# Patient Record
Sex: Female | Born: 1965
Health system: Southern US, Community
[De-identification: ages and names within clinical notes are randomized; demographics above are authoritative.]

## PROBLEM LIST (undated history)

## (undated) DIAGNOSIS — I639 Cerebral infarction, unspecified: Secondary | ICD-10-CM

## (undated) DIAGNOSIS — K648 Other hemorrhoids: Secondary | ICD-10-CM

## (undated) DIAGNOSIS — I82409 Acute embolism and thrombosis of unspecified deep veins of unspecified lower extremity: Secondary | ICD-10-CM

## (undated) DIAGNOSIS — J189 Pneumonia, unspecified organism: Secondary | ICD-10-CM

## (undated) DIAGNOSIS — J069 Acute upper respiratory infection, unspecified: Secondary | ICD-10-CM

## (undated) DIAGNOSIS — F329 Major depressive disorder, single episode, unspecified: Secondary | ICD-10-CM

## (undated) DIAGNOSIS — D259 Leiomyoma of uterus, unspecified: Secondary | ICD-10-CM

## (undated) DIAGNOSIS — K219 Gastro-esophageal reflux disease without esophagitis: Secondary | ICD-10-CM

## (undated) DIAGNOSIS — R109 Unspecified abdominal pain: Secondary | ICD-10-CM

## (undated) DIAGNOSIS — K52832 Lymphocytic colitis: Secondary | ICD-10-CM

## (undated) DIAGNOSIS — F32A Depression, unspecified: Secondary | ICD-10-CM

## (undated) DIAGNOSIS — F419 Anxiety disorder, unspecified: Secondary | ICD-10-CM

## (undated) DIAGNOSIS — I10 Essential (primary) hypertension: Secondary | ICD-10-CM

## (undated) DIAGNOSIS — E7800 Pure hypercholesterolemia, unspecified: Secondary | ICD-10-CM

## (undated) DIAGNOSIS — E538 Deficiency of other specified B group vitamins: Secondary | ICD-10-CM

## (undated) DIAGNOSIS — I82412 Acute embolism and thrombosis of left femoral vein: Secondary | ICD-10-CM

## (undated) DIAGNOSIS — K589 Irritable bowel syndrome without diarrhea: Secondary | ICD-10-CM

## (undated) DIAGNOSIS — K5190 Ulcerative colitis, unspecified, without complications: Secondary | ICD-10-CM

## (undated) HISTORY — DX: Gastro-esophageal reflux disease without esophagitis: K21.9

## (undated) HISTORY — DX: Acute embolism and thrombosis of left femoral vein: I82.412

## (undated) HISTORY — DX: Acute upper respiratory infection, unspecified: J06.9

## (undated) HISTORY — DX: Unspecified abdominal pain: R10.9

## (undated) HISTORY — DX: Anxiety disorder, unspecified: F41.9

## (undated) HISTORY — DX: Other hemorrhoids: K64.8

## (undated) HISTORY — DX: Lymphocytic colitis: K52.832

## (undated) HISTORY — DX: Leiomyoma of uterus, unspecified: D25.9

## (undated) HISTORY — DX: Pneumonia, unspecified organism: J18.9

## (undated) HISTORY — DX: Acute embolism and thrombosis of unspecified deep veins of unspecified lower extremity: I82.409

## (undated) HISTORY — PX: UTERINE FIBROID SURGERY: SHX826

## (undated) HISTORY — DX: Depression, unspecified: F32.A

## (undated) HISTORY — DX: Major depressive disorder, single episode, unspecified: F32.9

## (undated) HISTORY — DX: Pure hypercholesterolemia, unspecified: E78.00

## (undated) HISTORY — PX: MYOMECTOMY: SHX85

## (undated) HISTORY — DX: Deficiency of other specified B group vitamins: E53.8

## (undated) HISTORY — DX: Irritable bowel syndrome, unspecified: K58.9

## (undated) HISTORY — PX: CHOLECYSTECTOMY: SHX55

---

## 1996-08-30 DIAGNOSIS — J189 Pneumonia, unspecified organism: Secondary | ICD-10-CM

## 1996-08-30 HISTORY — DX: Pneumonia, unspecified organism: J18.9

## 1999-06-24 ENCOUNTER — Other Ambulatory Visit: Admission: RE | Admit: 1999-06-24 | Discharge: 1999-06-24 | Payer: Self-pay | Admitting: *Deleted

## 2001-01-18 ENCOUNTER — Other Ambulatory Visit: Admission: RE | Admit: 2001-01-18 | Discharge: 2001-01-18 | Payer: Self-pay | Admitting: *Deleted

## 2001-07-17 ENCOUNTER — Encounter: Admission: RE | Admit: 2001-07-17 | Discharge: 2001-07-17 | Payer: Self-pay | Admitting: Family Medicine

## 2001-07-17 ENCOUNTER — Encounter: Payer: Self-pay | Admitting: Obstetrics & Gynecology

## 2001-08-18 ENCOUNTER — Encounter: Admission: RE | Admit: 2001-08-18 | Discharge: 2001-08-18 | Payer: Self-pay | Admitting: Surgery

## 2001-08-18 ENCOUNTER — Encounter: Payer: Self-pay | Admitting: Surgery

## 2001-10-09 ENCOUNTER — Observation Stay (HOSPITAL_COMMUNITY): Admission: RE | Admit: 2001-10-09 | Discharge: 2001-10-10 | Payer: Self-pay | Admitting: Surgery

## 2001-10-09 ENCOUNTER — Encounter: Payer: Self-pay | Admitting: Surgery

## 2001-10-09 ENCOUNTER — Encounter (INDEPENDENT_AMBULATORY_CARE_PROVIDER_SITE_OTHER): Payer: Self-pay | Admitting: Specialist

## 2002-05-24 ENCOUNTER — Other Ambulatory Visit: Admission: RE | Admit: 2002-05-24 | Discharge: 2002-05-24 | Payer: Self-pay | Admitting: *Deleted

## 2002-12-24 ENCOUNTER — Encounter: Payer: Self-pay | Admitting: Obstetrics & Gynecology

## 2002-12-24 ENCOUNTER — Inpatient Hospital Stay (HOSPITAL_COMMUNITY): Admission: AD | Admit: 2002-12-24 | Discharge: 2002-12-24 | Payer: Self-pay | Admitting: *Deleted

## 2002-12-26 ENCOUNTER — Inpatient Hospital Stay (HOSPITAL_COMMUNITY): Admission: AD | Admit: 2002-12-26 | Discharge: 2002-12-26 | Payer: Self-pay | Admitting: *Deleted

## 2002-12-27 ENCOUNTER — Encounter: Payer: Self-pay | Admitting: *Deleted

## 2003-01-01 ENCOUNTER — Inpatient Hospital Stay (HOSPITAL_COMMUNITY): Admission: AD | Admit: 2003-01-01 | Discharge: 2003-01-06 | Payer: Self-pay | Admitting: *Deleted

## 2003-01-07 ENCOUNTER — Encounter: Admission: RE | Admit: 2003-01-07 | Discharge: 2003-02-06 | Payer: Self-pay | Admitting: *Deleted

## 2003-02-06 ENCOUNTER — Other Ambulatory Visit: Admission: RE | Admit: 2003-02-06 | Discharge: 2003-02-06 | Payer: Self-pay | Admitting: *Deleted

## 2003-02-07 ENCOUNTER — Encounter: Admission: RE | Admit: 2003-02-07 | Discharge: 2003-04-29 | Payer: Self-pay | Admitting: *Deleted

## 2003-07-12 ENCOUNTER — Ambulatory Visit (HOSPITAL_COMMUNITY): Admission: RE | Admit: 2003-07-12 | Discharge: 2003-07-12 | Payer: Self-pay | Admitting: Gastroenterology

## 2003-07-18 ENCOUNTER — Ambulatory Visit (HOSPITAL_COMMUNITY): Admission: RE | Admit: 2003-07-18 | Discharge: 2003-07-18 | Payer: Self-pay | Admitting: Gastroenterology

## 2003-11-12 ENCOUNTER — Encounter: Payer: Self-pay | Admitting: Gastroenterology

## 2003-11-12 DIAGNOSIS — K648 Other hemorrhoids: Secondary | ICD-10-CM | POA: Insufficient documentation

## 2004-02-28 ENCOUNTER — Encounter: Payer: Self-pay | Admitting: Family Medicine

## 2004-02-28 LAB — CONVERTED CEMR LAB

## 2004-03-03 ENCOUNTER — Encounter: Admission: RE | Admit: 2004-03-03 | Discharge: 2004-03-03 | Payer: Self-pay | Admitting: Family Medicine

## 2004-07-21 ENCOUNTER — Ambulatory Visit: Payer: Self-pay | Admitting: Family Medicine

## 2004-09-02 ENCOUNTER — Ambulatory Visit: Payer: Self-pay | Admitting: Family Medicine

## 2004-10-13 ENCOUNTER — Ambulatory Visit: Payer: Self-pay | Admitting: Family Medicine

## 2004-10-20 ENCOUNTER — Ambulatory Visit: Payer: Self-pay | Admitting: Family Medicine

## 2004-11-18 ENCOUNTER — Ambulatory Visit: Payer: Self-pay | Admitting: Family Medicine

## 2004-12-14 ENCOUNTER — Ambulatory Visit: Payer: Self-pay | Admitting: Gastroenterology

## 2004-12-16 ENCOUNTER — Encounter: Admission: RE | Admit: 2004-12-16 | Discharge: 2004-12-16 | Payer: Self-pay | Admitting: Gastroenterology

## 2004-12-29 ENCOUNTER — Ambulatory Visit: Payer: Self-pay | Admitting: Gastroenterology

## 2005-01-22 ENCOUNTER — Ambulatory Visit: Payer: Self-pay | Admitting: Family Medicine

## 2005-06-25 ENCOUNTER — Ambulatory Visit: Payer: Self-pay | Admitting: Family Medicine

## 2005-09-02 ENCOUNTER — Ambulatory Visit: Payer: Self-pay | Admitting: Family Medicine

## 2005-09-09 ENCOUNTER — Ambulatory Visit: Payer: Self-pay | Admitting: Family Medicine

## 2005-09-30 ENCOUNTER — Ambulatory Visit: Payer: Self-pay | Admitting: Family Medicine

## 2005-11-16 ENCOUNTER — Ambulatory Visit: Payer: Self-pay | Admitting: Family Medicine

## 2005-11-26 ENCOUNTER — Encounter: Admission: RE | Admit: 2005-11-26 | Discharge: 2005-11-26 | Payer: Self-pay | Admitting: *Deleted

## 2005-12-11 ENCOUNTER — Ambulatory Visit: Payer: Self-pay | Admitting: Family Medicine

## 2005-12-17 ENCOUNTER — Encounter: Admission: RE | Admit: 2005-12-17 | Discharge: 2005-12-17 | Payer: Self-pay | Admitting: *Deleted

## 2006-07-26 ENCOUNTER — Encounter: Admission: RE | Admit: 2006-07-26 | Discharge: 2006-07-26 | Payer: Self-pay | Admitting: *Deleted

## 2006-08-26 ENCOUNTER — Ambulatory Visit: Payer: Self-pay | Admitting: Family Medicine

## 2006-09-09 ENCOUNTER — Ambulatory Visit: Payer: Self-pay | Admitting: Family Medicine

## 2006-09-09 LAB — CONVERTED CEMR LAB
Albumin: 3.9 g/dL (ref 3.5–5.2)
CO2: 20 meq/L (ref 19–32)
Cholesterol: 209 mg/dL — ABNORMAL HIGH (ref 0–200)
Creatinine, Ser: 0.72 mg/dL (ref 0.40–1.20)
Phosphorus: 3.2 mg/dL (ref 2.3–4.6)
TSH: 1.803 microintl units/mL (ref 0.350–5.50)

## 2006-10-14 ENCOUNTER — Ambulatory Visit: Payer: Self-pay | Admitting: Family Medicine

## 2007-01-03 ENCOUNTER — Encounter: Admission: RE | Admit: 2007-01-03 | Discharge: 2007-01-03 | Payer: Self-pay | Admitting: *Deleted

## 2007-03-24 ENCOUNTER — Ambulatory Visit: Payer: Self-pay | Admitting: Family Medicine

## 2007-03-25 ENCOUNTER — Emergency Department (HOSPITAL_COMMUNITY): Admission: EM | Admit: 2007-03-25 | Discharge: 2007-03-25 | Payer: Self-pay | Admitting: Emergency Medicine

## 2007-05-19 ENCOUNTER — Ambulatory Visit: Payer: Self-pay | Admitting: Gastroenterology

## 2007-06-14 ENCOUNTER — Encounter: Payer: Self-pay | Admitting: Family Medicine

## 2007-08-21 ENCOUNTER — Telehealth: Payer: Self-pay | Admitting: Family Medicine

## 2007-09-11 ENCOUNTER — Ambulatory Visit: Payer: Self-pay | Admitting: Family Medicine

## 2007-11-13 DIAGNOSIS — J45909 Unspecified asthma, uncomplicated: Secondary | ICD-10-CM

## 2007-11-13 DIAGNOSIS — K219 Gastro-esophageal reflux disease without esophagitis: Secondary | ICD-10-CM

## 2007-11-13 DIAGNOSIS — J309 Allergic rhinitis, unspecified: Secondary | ICD-10-CM | POA: Insufficient documentation

## 2007-11-13 DIAGNOSIS — D259 Leiomyoma of uterus, unspecified: Secondary | ICD-10-CM | POA: Insufficient documentation

## 2007-11-13 DIAGNOSIS — K589 Irritable bowel syndrome without diarrhea: Secondary | ICD-10-CM

## 2007-11-14 ENCOUNTER — Encounter: Payer: Self-pay | Admitting: Gastroenterology

## 2007-11-21 ENCOUNTER — Telehealth: Payer: Self-pay | Admitting: Family Medicine

## 2007-12-07 ENCOUNTER — Encounter (INDEPENDENT_AMBULATORY_CARE_PROVIDER_SITE_OTHER): Payer: Self-pay | Admitting: *Deleted

## 2008-01-05 ENCOUNTER — Encounter: Admission: RE | Admit: 2008-01-05 | Discharge: 2008-01-05 | Payer: Self-pay | Admitting: *Deleted

## 2008-01-29 ENCOUNTER — Telehealth: Payer: Self-pay | Admitting: Family Medicine

## 2008-01-31 ENCOUNTER — Encounter: Payer: Self-pay | Admitting: Family Medicine

## 2008-01-31 DIAGNOSIS — F411 Generalized anxiety disorder: Secondary | ICD-10-CM | POA: Insufficient documentation

## 2008-01-31 DIAGNOSIS — Z8711 Personal history of peptic ulcer disease: Secondary | ICD-10-CM

## 2008-01-31 DIAGNOSIS — F418 Other specified anxiety disorders: Secondary | ICD-10-CM | POA: Insufficient documentation

## 2008-01-31 DIAGNOSIS — E041 Nontoxic single thyroid nodule: Secondary | ICD-10-CM | POA: Insufficient documentation

## 2008-03-13 ENCOUNTER — Telehealth (INDEPENDENT_AMBULATORY_CARE_PROVIDER_SITE_OTHER): Payer: Self-pay | Admitting: *Deleted

## 2008-03-15 ENCOUNTER — Ambulatory Visit: Payer: Self-pay | Admitting: Family Medicine

## 2008-03-28 ENCOUNTER — Telehealth (INDEPENDENT_AMBULATORY_CARE_PROVIDER_SITE_OTHER): Payer: Self-pay | Admitting: *Deleted

## 2008-04-11 ENCOUNTER — Telehealth: Payer: Self-pay | Admitting: Family Medicine

## 2008-06-28 ENCOUNTER — Telehealth (INDEPENDENT_AMBULATORY_CARE_PROVIDER_SITE_OTHER): Payer: Self-pay | Admitting: Internal Medicine

## 2008-09-02 ENCOUNTER — Telehealth: Payer: Self-pay | Admitting: Family Medicine

## 2008-09-16 ENCOUNTER — Telehealth (INDEPENDENT_AMBULATORY_CARE_PROVIDER_SITE_OTHER): Payer: Self-pay | Admitting: *Deleted

## 2008-10-28 ENCOUNTER — Telehealth: Payer: Self-pay | Admitting: Family Medicine

## 2008-11-14 ENCOUNTER — Telehealth: Payer: Self-pay | Admitting: Family Medicine

## 2009-01-06 ENCOUNTER — Encounter: Admission: RE | Admit: 2009-01-06 | Discharge: 2009-01-06 | Payer: Self-pay | Admitting: Obstetrics and Gynecology

## 2009-03-27 ENCOUNTER — Telehealth: Payer: Self-pay | Admitting: Family Medicine

## 2009-06-03 ENCOUNTER — Telehealth: Payer: Self-pay | Admitting: Family Medicine

## 2009-07-03 ENCOUNTER — Telehealth: Payer: Self-pay | Admitting: Family Medicine

## 2009-07-18 ENCOUNTER — Ambulatory Visit: Payer: Self-pay | Admitting: Family Medicine

## 2009-07-18 DIAGNOSIS — R0989 Other specified symptoms and signs involving the circulatory and respiratory systems: Secondary | ICD-10-CM | POA: Insufficient documentation

## 2009-07-18 DIAGNOSIS — E785 Hyperlipidemia, unspecified: Secondary | ICD-10-CM

## 2009-07-18 DIAGNOSIS — R0609 Other forms of dyspnea: Secondary | ICD-10-CM

## 2009-07-18 DIAGNOSIS — I10 Essential (primary) hypertension: Secondary | ICD-10-CM | POA: Insufficient documentation

## 2009-08-27 ENCOUNTER — Ambulatory Visit: Payer: Self-pay | Admitting: Family Medicine

## 2009-09-01 LAB — CONVERTED CEMR LAB
ALT: 25 units/L (ref 0–35)
AST: 26 units/L (ref 0–37)
Basophils Absolute: 0.1 10*3/uL (ref 0.0–0.1)
Basophils Relative: 1.5 % (ref 0.0–3.0)
Bilirubin, Direct: 0 mg/dL (ref 0.0–0.3)
Chloride: 105 meq/L (ref 96–112)
Cholesterol: 212 mg/dL — ABNORMAL HIGH (ref 0–200)
Eosinophils Relative: 6 % — ABNORMAL HIGH (ref 0.0–5.0)
GFR calc non Af Amer: 96.23 mL/min (ref >60)
Glucose, Bld: 93 mg/dL (ref 70–99)
HDL: 33.9 mg/dL — ABNORMAL LOW (ref >39.00)
Hemoglobin: 10.2 g/dL — ABNORMAL LOW (ref 12.0–15.0)
MCV: 70.9 fL — ABNORMAL LOW (ref 78.0–100.0)
Monocytes Relative: 6.2 % (ref 3.0–12.0)
Neutro Abs: 5.6 10*3/uL (ref 1.4–7.7)
Phosphorus: 3.4 mg/dL (ref 2.3–4.6)
TSH: 2.07 microintl units/mL (ref 0.35–5.50)
Total Bilirubin: 0.6 mg/dL (ref 0.3–1.2)
Total CHOL/HDL Ratio: 6

## 2009-10-02 ENCOUNTER — Ambulatory Visit: Payer: Self-pay | Admitting: Family Medicine

## 2009-10-02 DIAGNOSIS — D649 Anemia, unspecified: Secondary | ICD-10-CM | POA: Insufficient documentation

## 2009-10-03 LAB — CONVERTED CEMR LAB
Basophils Absolute: 0.1 10*3/uL (ref 0.0–0.1)
Eosinophils Absolute: 0.5 10*3/uL (ref 0.0–0.7)
HCT: 35.9 % — ABNORMAL LOW (ref 36.0–46.0)
Lymphocytes Relative: 28.1 % (ref 12.0–46.0)
MCV: 76.7 fL — ABNORMAL LOW (ref 78.0–100.0)
Monocytes Absolute: 0.6 10*3/uL (ref 0.1–1.0)
Monocytes Relative: 7 % (ref 3.0–12.0)
Neutro Abs: 4.5 10*3/uL (ref 1.4–7.7)
Platelets: 417 10*3/uL — ABNORMAL HIGH (ref 150.0–400.0)
RBC: 4.68 M/uL (ref 3.87–5.11)
WBC: 7.9 10*3/uL (ref 4.5–10.5)

## 2009-10-07 ENCOUNTER — Telehealth: Payer: Self-pay | Admitting: Family Medicine

## 2009-11-24 ENCOUNTER — Telehealth: Payer: Self-pay | Admitting: Family Medicine

## 2010-01-23 ENCOUNTER — Telehealth: Payer: Self-pay | Admitting: Family Medicine

## 2010-01-27 ENCOUNTER — Telehealth: Payer: Self-pay | Admitting: Family Medicine

## 2010-02-09 ENCOUNTER — Encounter: Admission: RE | Admit: 2010-02-09 | Discharge: 2010-02-09 | Payer: Self-pay | Admitting: Obstetrics and Gynecology

## 2010-02-24 ENCOUNTER — Ambulatory Visit: Payer: Self-pay | Admitting: Family Medicine

## 2010-02-26 LAB — CONVERTED CEMR LAB
Basophils Absolute: 0.2 10*3/uL — ABNORMAL HIGH (ref 0.0–0.1)
Basophils Relative: 1.4 % (ref 0.0–3.0)
Chloride: 109 meq/L (ref 96–112)
Cholesterol: 214 mg/dL — ABNORMAL HIGH (ref 0–200)
Eosinophils Absolute: 0.3 10*3/uL (ref 0.0–0.7)
GFR calc non Af Amer: 94.45 mL/min (ref >60)
HCT: 37.2 % (ref 36.0–46.0)
Hemoglobin: 12.5 g/dL (ref 12.0–15.0)
Iron: 80 ug/dL (ref 42–145)
Lymphs Abs: 2.6 10*3/uL (ref 0.7–4.0)
MCV: 88.5 fL (ref 78.0–100.0)
Neutro Abs: 7.3 10*3/uL (ref 1.4–7.7)
Neutrophils Relative %: 66.7 % (ref 43.0–77.0)
Platelets: 317 10*3/uL (ref 150.0–400.0)
RBC: 4.2 M/uL (ref 3.87–5.11)
Saturation Ratios: 16.4 % — ABNORMAL LOW (ref 20.0–50.0)
Transferrin: 348.5 mg/dL (ref 212.0–360.0)
Triglycerides: 264 mg/dL — ABNORMAL HIGH (ref 0.0–149.0)
VLDL: 52.8 mg/dL — ABNORMAL HIGH (ref 0.0–40.0)
WBC: 11 10*3/uL — ABNORMAL HIGH (ref 4.5–10.5)

## 2010-04-21 ENCOUNTER — Telehealth: Payer: Self-pay | Admitting: Family Medicine

## 2010-05-05 ENCOUNTER — Telehealth: Payer: Self-pay | Admitting: Family Medicine

## 2010-09-04 ENCOUNTER — Ambulatory Visit: Admit: 2010-09-04 | Payer: Self-pay | Admitting: Family Medicine

## 2010-09-04 DIAGNOSIS — Z0289 Encounter for other administrative examinations: Secondary | ICD-10-CM

## 2010-09-29 NOTE — Progress Notes (Signed)
Summary: Spironlactone Refill  Phone Note Refill Request Call back at [phone] Message from:  [facility] [address] on May 05, 2010 4:41 PM  Refills Requested: Medication #1:  ALDACTONE 50 MG TABS 1 by mouth once daily in am OK to refill 1 by mouth every morning #30 with 3 refills?   Method Requested: Electronic Initial call taken by: Arnetha Courser CMA Deborra Medina),  May 05, 2010 4:42 PM  Follow-up for Phone Call        px written on EMR for call in  Follow-up by: Allena Earing MD,  May 05, 2010 5:08 PM  Additional Follow-up for Phone Call Additional follow up Details #1::        Medication phoned [place] as instructed. Ozzie Hoyle LPN  May 06, 6115 5:10 PM     Prescriptions: ALDACTONE 50 MG TABS (SPIRONOLACTONE) 1 by mouth once daily in am  #30 x 5   Entered and Authorized by:   Allena Earing MD   Signed by:   Ozzie Hoyle LPN on 43/53/9122   Method used:   Telephoned to ...       [facility] #[id number]* (retail)       [place], [place]  [address]       Ph: [phone]       Fax: [phone]   RxID:   [id number]

## 2010-09-29 NOTE — Progress Notes (Signed)
Summary: Rx Bupropion  Phone Note Refill Request Call back at [phone] Message from:  [facility] on Jan 28, 3343 3:47 PM  Refills Requested: Medication #1:  BUPROPION HCL XL 150 Received E-script request, medication is not on medication list.  Please advise   Method Requested: Telephone to Pharmacy Initial call taken by: Sherrian Divers CMA Deborra Medina),  Jan 27, 2010 3:50 PM  Follow-up for Phone Call        please check in with pt -- and find out what the deal is   Follow-up by: Allena Earing MD,  Jan 27, 2010 4:25 PM  Additional Follow-up for Phone Call Additional follow up Details #1::        Patient notified as instructed by telephone. [facility] said [facility] to [facility] Dr Edwyna Ready for refill [place] notified. Spoke with Langley Gauss at [facility] and she said was already filled by Dr Edwyna Ready.Ozzie Hoyle LPN  Jan 27, 8300 5:99 PM

## 2010-09-29 NOTE — Progress Notes (Signed)
Summary: Rx Alprazolam  Phone Note Refill Request Call back at [phone] Message from:  [facility] on November 24, 3157 9:24 AM  Refills Requested: Medication #1:  ALPRAZOLAM XR 1 MG  TB24 one two times a day as needed for airplane flights   Last Refilled: 07/18/2009 Received faxed refill request, please advise   Method Requested: Telephone to Pharmacy Initial call taken by: Sherrian Divers CMA Deborra Medina),  November 24, 2009 9:25 AM  Follow-up for Phone Call        px written on EMR for call in I made a correction-- this should just be once daily instead of two times a day since it is long acting   Follow-up by: Allena Earing MD,  November 24, 2009 9:36 AM  Additional Follow-up for Phone Call Additional follow up Details #1::        Medication phoned to [facility] as instructed. Ozzie Hoyle LPN  November 25, 4583 92:92 AM     New/Updated Medications: ALPRAZOLAM XR 1 MG  TB24 (ALPRAZOLAM) 1 by mouth once daily as needed airplane flight Prescriptions: ALPRAZOLAM XR 1 MG  TB24 (ALPRAZOLAM) 1 by mouth once daily as needed airplane flight  #15 x 0   Entered and Authorized by:   Allena Earing MD   Signed by:   Ozzie Hoyle LPN on 44/62/8638   Method used:   Telephoned to ...       [facility] #[id number]* (retail)       [place], [place]  [address]       Ph: [phone]       Fax: [phone]   RxID:   [id number]

## 2010-09-29 NOTE — Assessment & Plan Note (Signed)
Summary: 6 MONTH FOLLOW UP/[facility]   Vital Signs:  Patient profile:   45 year old female Height:      62.25 inches Weight:      205 pounds BMI:     37.33 Temp:     98 degrees F oral Pulse rate:   84 / minute Pulse rhythm:   regular BP sitting:   116 / 70  (left arm) Cuff size:   large  Vitals Entered By: Ozzie Hoyle LPN (February 25, 6567 1:27 AM) CC: six month f/u   History of Present Illness: here for f/u of anemia and lipids and HTN  is feeling pretty good!  wt is down 25 lb- on weight watchers and with exercise   HTN in good control   last lipids trig 333 is eating better overall -- is avoiding fried foods in general / also eating more fruit and veg   anemia had improved  is taking 1 iron pill per day  sob has improved with this - conditioning is better   is ok on meds for mood -- is taking the wellbutrin   Allergies: 1)  ! Zoloft  Past History:  Past Medical History: Last updated: February 25, 2008 INTERNAL HEMORRHOIDS (ICD-455.0) GERD (ICD-530.81) IRRITABLE BOWEL SYNDROME (ICD-564.1) FIBROIDS, UTERUS (ICD-218.9) ALLERGIC RHINITIS (ICD-477.9) HYPERCHOLESTEROLEMIA (ICD-272.0) ASTHMA (ICD-493.90) URI (ICD-465.9) ABDOMINAL PAIN, ACUTE (ICD-789.00) Anxiety Depression  Past Surgical History: Last updated: 02/25/08 Cholecystectomy (2003) Hospital - pneumonia (1998) EGD- neg (07/2003) Colonoscopy Uterine fibroids CT chest- neg (02/2004)  Family History: Last updated: February 25, 2008 Father: died from pulmonary fibrosis Mother:  Siblings:   Social History: Last updated: 07/18/2009 Marital Status: Married Children:  Occupation: [employer]/ [employer]   Risk Factors: Smoking Status: quit (Feb 25, 2008)  Review of Systems General:  Denies fatigue, loss of appetite, and malaise. Eyes:  Denies blurring and eye irritation. CV:  Denies chest pain or discomfort, palpitations, and shortness of breath with exertion. Resp:  Denies cough, shortness of breath, and  wheezing. GI:  Denies abdominal pain, bloody stools, change in bowel habits, and indigestion. MS:  Denies joint pain, muscle aches, and cramps. Derm:  Denies lesion(s), poor wound healing, and rash. Neuro:  Denies numbness and tingling. Psych:  mood is better . Endo:  Denies cold intolerance, excessive thirst, excessive urination, and heat intolerance. Heme:  Denies abnormal bruising and bleeding.  Physical Exam  General:  overweight but generally well appearing has lost sig wt  Head:  normocephalic, atraumatic, and no abnormalities observed.   Eyes:  vision grossly intact, pupils equal, pupils round, and pupils reactive to light.  no conj pallor Mouth:  pharynx pink and moist.   Neck:  supple with full rom and no masses or thyromegally, no JVD or carotid bruit  Lungs:  Normal respiratory effort, chest expands symmetrically. Lungs are clear to auscultation, no crackles or wheezes. Heart:  Normal rate and regular rhythm. S1 and S2 normal without gallop, murmur, click, rub or other extra sounds. Abdomen:  Bowel sounds positive,abdomen soft and non-tender without masses, organomegaly or hernias noted.  no suprapubic tenderness or fullness felt  Msk:  No deformity or scoliosis noted of thoracic or lumbar spine.  no acute joint changes  Pulses:  R and L carotid,radial,femoral,dorsalis pedis and posterior tibial pulses are full and equal bilaterally Extremities:  No clubbing, cyanosis, edema, or deformity noted with normal full range of motion of all joints.   Neurologic:  sensation intact to light touch, gait normal, and DTRs symmetrical and normal.   Skin:  Intact without suspicious lesions or rashes no pallor  brisk cap refil Cervical Nodes:  No lymphadenopathy noted Psych:  normal affect, talkative and pleasant    Impression & Recommendations:  Problem # 1:  ANEMIA (ICD-285.9) Assessment Improved  iron def from menses - imp with iron  will check labs sob and fatigue are improved    Orders: Venipuncture (37902) TLB-Lipid Panel (80061-LIPID) TLB-Renal Function Panel (80069-RENAL) TLB-CBC Platelet - w/Differential (85025-CBCD) TLB-IBC Pnl (Iron/FE;Transferrin) (83550-IBC)  Hgb: 11.5 (10/02/2009)   Hct: 35.9 (10/02/2009)   Platelets: 417.0 (10/02/2009) RBC: 4.68 (10/02/2009)   RDW: 21.9 H % (10/02/2009)   WBC: 7.9 (10/02/2009) MCV: 76.7 (10/02/2009)   MCHC: 32.1 (10/02/2009) TSH: 2.07 (08/27/2009)  Problem # 2:  HYPERTENSION (ICD-401.9) Assessment: Unchanged  bp is good on aldactone and also with wt loss  urged to keep up the good habits lab today f/u 6 mo  Her updated medication list for this problem includes:    Aldactone 50 Mg Tabs (Spironolactone) .Marland Kitchen... 1 by mouth once daily in am  Orders: Venipuncture (40973) TLB-Lipid Panel (80061-LIPID) TLB-Renal Function Panel (80069-RENAL) TLB-CBC Platelet - w/Differential (85025-CBCD) TLB-IBC Pnl (Iron/FE;Transferrin) (83550-IBC)  BP today: 116/70 Prior BP: 128/82 (08/27/2009)  Labs Reviewed: K+: 4.3 (08/27/2009) Creat: : 0.7 (08/27/2009)   Chol: 212 (08/27/2009)   HDL: 33.90 (08/27/2009)   LDL: 134 (09/09/2006)   TG: 333.0 (08/27/2009)  Problem # 3:  HYPERLIPIDEMIA (ICD-272.4) Assessment: Unchanged  expect imp with better diet check today rev low sat fat diet  Orders: Venipuncture (53299) TLB-Lipid Panel (80061-LIPID) TLB-Renal Function Panel (80069-RENAL) TLB-CBC Platelet - w/Differential (85025-CBCD) TLB-IBC Pnl (Iron/FE;Transferrin) (83550-IBC)  Labs Reviewed: SGOT: 26 (08/27/2009)   SGPT: 25 (08/27/2009)   HDL:33.90 (08/27/2009), 47 (09/09/2006)  LDL:134 (09/09/2006)  Chol:212 (08/27/2009), 209 (09/09/2006)  Trig:333.0 (08/27/2009), 138 (09/09/2006)  Problem # 4:  DEPRESSION (ICD-311) Assessment: Improved  imp with addn of wellbutrin will continue that and exercise  The following medications were removed from the medication list:    Zoloft 50 Mg Tabs (Sertraline hcl) .Marland Kitchen... 11/2 by mouth  once daily Her updated medication list for this problem includes:    Alprazolam Xr 1 Mg Tb24 (Alprazolam) .Marland Kitchen... 1 by mouth once daily as needed airplane flight    Wellbutrin Xl 150 Mg Xr24h-tab (Bupropion hcl) .Marland Kitchen... Take 1 tablet by mouth once a day  Discussed treatment options, including trial of antidpressant medication. Will refer to behavioral health. Follow-up call in in 24-48 hours and recheck in 2 weeks, sooner as needed. Patient agrees to call if any worsening of symptoms or thoughts of doing harm arise. Verified that the patient has no suicidal ideation at this time.   Complete Medication List: 1)  Flonase 50 Mcg/act Susp (Fluticasone propionate) .... 2 sprays in each nostril once daily 2)  Protonix 40 Mg Tbec (Pantoprazole sodium) .Marland Kitchen.. 1 daily by mouth 3)  Allegra 180 Mg Tabs (Fexofenadine hcl) .... Take 1 tablet by mouth once a day 4)  Alprazolam Xr 1 Mg Tb24 (Alprazolam) .Marland Kitchen.. 1 by mouth once daily as needed airplane flight 5)  Diclofenac Sodium 75 Mg Tbec (Diclofenac sodium) .... Take one by mouth three times a day as needed pain with food 6)  Advair Diskus 100-50 Mcg/dose Misc (Fluticasone-salmeterol) .Marland Kitchen.. 1 inhalation two times a day  rinse mouth with water after use as needed 7)  Aldactone 50 Mg Tabs (Spironolactone) .Marland Kitchen.. 1 by mouth once daily in am 8)  Wellbutrin Xl 150 Mg Xr24h-tab (Bupropion hcl) .... Take 1 tablet by mouth  once a day 9)  Loestrin ?mg  .... As directed  Patient Instructions: 1)  keep up the great job with weight loss and diet and exercise  2)  labs today 3)  continue iron  4)  no change in medicine  5)  follow up in 6 months   Current Allergies (reviewed today): ! ZOLOFT

## 2010-09-29 NOTE — Progress Notes (Signed)
Summary: Alprazolam ER 60m refill  Phone Note Refill Request Call back at [phone] from:  [place]on Jan 23, 2010 3:00 PM  Refills Requested: Medication #1:  ALPRAZOLAM XR 1 MG  TB24 1 by mouth once daily as needed airplane flight   Last Refilled: [facility] for Alprazolam ER 183m#15.Please advise.    Method Requested: Telephone to Pharmacy Initial call taken by: ReOzzie HoylePN,  May 27, 209935:7:01M  Follow-up for Phone Call        px written on EMR for call in  Follow-up by: MaAllena EaringD,  Jan 23, 2010 4:02 PM  Additional Follow-up for Phone Call Additional follow up Details #1::        [facility]. ReOzzie HoylePN  May 27, 207793:9:03M     Prescriptions: ALPRAZOLAM XR 1 MG  TB24 (ALPRAZOLAM) 1 by mouth once daily as needed airplane flight  #15 x 0   Entered and Authorized by:   MaAllena EaringD   Signed by:   ReOzzie HoylePN on 0500/92/3300 Method used:   Telephoned to ...       [id number]       [address]  [id number]     Ph: [phone]     Fax: [phone] RxID:   [id number]

## 2010-09-29 NOTE — Progress Notes (Signed)
Summary: refill request for alprazolam  Phone Note Refill Request Message from:  Fax from Pharmacy  Refills Requested: Medication #1:  ALPRAZOLAM XR 1 MG  TB24 1 by mouth once daily as needed airplane flight   Last Refilled: 01/23/2010 Faxed request from [facility], [phone]  Initial call taken by: Marty Heck CMA,  April 21, 2010 8:28 AM  Follow-up for Phone Call        px written on EMR for call in  Follow-up by: Allena Earing MD,  April 21, 2010 9:59 AM  Additional Follow-up for Phone Call Additional follow up Details #1::        Rx called to pharmacy Additional Follow-up by: Emelia Salisbury LPN,  April 21, 2228 1:18 PM    Prescriptions: ALPRAZOLAM XR 1 MG  TB24 (ALPRAZOLAM) 1 by mouth once daily as needed airplane flight  #15 x 0   Entered and Authorized by:   Allena Earing MD   Signed by:   Allena Earing MD on 04/21/2010   Method used:   Telephoned to ...       [facility] #[id number]* (retail)       [place], [place]  [address]       Ph: [phone]       Fax: [phone]   RxID:   [id number]

## 2010-09-29 NOTE — Progress Notes (Signed)
Summary: refill request for sertraline  Phone Note Refill Request Message from:  Fax from Pharmacy  Refills Requested: Medication #1:  sertraline 50 mg 07/18/09 Faxed request from [facility].  This is no longer on pt's med list.  Initial call taken by: Marty Heck CMA,  October 07, 2009 1:03 PM  Follow-up for Phone Call        that is fine to re start- just verify with pt that she has re started it ( I do have on her list that it caused her diarrhea in past0 px written on EMR for call in  Follow-up by: Allena Earing MD,  October 07, 2009 1:46 PM  Additional Follow-up for Phone Call Additional follow up Details #1::        I spoke with pt and she did not request a refill on the Zoloft or Sertraline. Pt said she did not want that med. [facility] and spoke with [place]. He was not sure why request was sent but he said he would delete the request and medication was not called in . Thank you. Ozzie Hoyle LPN  October 07, 9377 4:18 PM     New/Updated Medications: ZOLOFT 50 MG TABS (SERTRALINE HCL) 11/2 by mouth once daily Prescriptions: ZOLOFT 50 MG TABS (SERTRALINE HCL) 11/2 by mouth once daily  #45 x 11   Entered and Authorized by:   Allena Earing MD   Signed by:   Allena Earing MD on 10/07/2009   Method used:   Telephoned to ...         RxID:   [id number]

## 2010-10-12 ENCOUNTER — Telehealth: Payer: Self-pay | Admitting: Gastroenterology

## 2010-10-21 NOTE — Progress Notes (Signed)
Summary: Triage  Medications Added ANUSOL-HC 25 MG SUPP (HYDROCORTISONE ACETATE) Insert 1 per rectum nightly for 5 nights       Phone Note Call from Patient Call back at Home Phone [phone]   Caller: Patient Call For: Dr. Deatra Ina Reason for Call: Talk to Nurse Summary of Call: Abd pain, diarrhea, problems w/hemorrhoids Initial call taken by: Webb Laws,  October 12, 2010 9:15 AM  Follow-up for Phone Call        Spoke with patient and she states she is having some abdominal cramping and some diarrhea. She is having some pain from her internal hemorrhoids. She has increased her Protonix to twice a day and has tried some OTC suppositories for the hemorrhoids but has not had much relief. Also states there is some tissue "hanging from her rectum." Informed patient that was probably an external hemorrhoid. Dr. Deatra Ina please advise. Follow-up by: Rosanne Sack RN,  October 12, 2010 11:02 AM  Additional Follow-up for Phone Call Additional follow up Details #1::        anusol hc supp x 5 days ov if no better Additional Follow-up by: Inda Castle MD,  October 12, 2010 4:10 PM    Additional Follow-up for Phone Call Additional follow up Details #2::    Patient aware of Dr. Kelby Fam recommendations. Follow-up by: Rosanne Sack RN,  October 12, 2010 4:30 PM  New/Updated Medications: ANUSOL-HC 25 MG SUPP (HYDROCORTISONE ACETATE) Insert 1 per rectum nightly for 5 nights Prescriptions: ANUSOL-HC 25 MG SUPP (HYDROCORTISONE ACETATE) Insert 1 per rectum nightly for 5 nights  #5 x 0   Entered by:   Rosanne Sack RN   Authorized by:   Inda Castle MD   Signed by:   Rosanne Sack RN on 10/12/2010   Method used:   Electronically to        [facility] #[id number]* (retail)       [place], [place]  [address]       Ph: [phone]       Fax: [phone]   RxID:   [id number]

## 2010-11-19 ENCOUNTER — Telehealth: Payer: Self-pay | Admitting: Gastroenterology

## 2010-11-19 NOTE — Telephone Encounter (Signed)
Patient still complaining of hemorrhoids, rectal bleeding, and pain. Requesting to be seen. Appt made with Carol Savoy NP for 11/24/10 @8 :30am.

## 2010-11-22 ENCOUNTER — Other Ambulatory Visit: Payer: Self-pay | Admitting: Family Medicine

## 2010-11-24 ENCOUNTER — Ambulatory Visit (INDEPENDENT_AMBULATORY_CARE_PROVIDER_SITE_OTHER): Payer: BC Managed Care – PPO | Admitting: Nurse Practitioner

## 2010-11-24 ENCOUNTER — Encounter: Payer: Self-pay | Admitting: Nurse Practitioner

## 2010-11-24 VITALS — BP 118/78 | HR 80 | Ht 63.0 in | Wt 193.2 lb

## 2010-11-24 DIAGNOSIS — K625 Hemorrhage of anus and rectum: Secondary | ICD-10-CM

## 2010-11-24 DIAGNOSIS — K589 Irritable bowel syndrome without diarrhea: Secondary | ICD-10-CM

## 2010-11-24 DIAGNOSIS — K648 Other hemorrhoids: Secondary | ICD-10-CM | POA: Insufficient documentation

## 2010-11-24 MED ORDER — HYOSCYAMINE SULFATE 0.125 MG SL SUBL: 0.1250 mg | SUBLINGUAL_TABLET | Freq: Four times a day (QID) | SUBLINGUAL | Status: DC | PRN

## 2010-11-24 MED ORDER — HYDROCORTISONE ACETATE 25 MG RE SUPP: RECTAL | Status: DC

## 2010-11-24 NOTE — Assessment & Plan Note (Signed)
Suspect secondary to the internal hemorrhoids seen on exam today. Begin steroid suppositories.

## 2010-11-24 NOTE — Progress Notes (Signed)
HPI:   [name] is followed Dr. Deatra Johnson for history of GERD and irritable bowel. She had a normal EGD in 2009 and normal colonoscopy in 2005. She is worked in today for abdominal pain, diarrhea and hemorrhoid problems. Lately her stools have tended towards being loose though she doesn't have any more than 2 episodes a day.  She does have associated urgency. Any solid stools tend to be stringy. Patient is having some rectal bleeding and has seen blood in her toilet bowl and on tissue. She describes lower abdominal pain and anal pain with defecation.  Pain sometimes wakes her up at night. We called in a steroid suppositories a few weeks ago which helped the pain and bleeding.  On daily Protonix with good control of GERD symptoms.   Past Medical History  Diagnosis Date  . Internal hemorrhoid   . GERD (gastroesophageal reflux disease)   . Irritable bowel syndrome   . Fibroid uterus   . Allergic rhinitis   . Hypercholesterolemia   . Asthma   . URI (upper respiratory infection)   . Anxiety   . Depression    Past Surgical History  Procedure Date  . Cholecystectomy   . Myomectomy   . Uterine fibroid surgery     reports that she has quit smoking. She does not have any smokeless tobacco history on file. She reports that she drinks about .5 ounces of alcohol per week. She reports that she does not use illicit drugs. family history includes Colon cancer in her paternal aunt; Heart disease in her maternal grandmother, paternal aunt, and paternal uncle; Kidney disease in her cousin; and Uterine cancer in her paternal grandmother. Allergies  Allergen Reactions  . Sertraline Hcl     REACTION: diarrhea       Outpatient Encounter Prescriptions as of 11/24/2010  Medication Sig Dispense Refill  . buPROPion (WELLBUTRIN XL) 150 MG 24 hr tablet Take 150 mg by mouth daily.        . [facility] ALLERGY RELIEF 180 MG tablet TAKE 1 TABLET BY MOUTH ONCE A DAY  30 tablet  0  . diclofenac (VOLTAREN) 75 MG EC  tablet Take 75 mg by mouth. Take one by mouth three times a day as needed for pain with food       . fluticasone (FLONASE) 50 MCG/ACT nasal spray 2 sprays by Nasal route daily.        . Norethin Ace-Eth Estrad-FE (LOESTRIN 24 FE PO) Take by mouth. Take 1 tablet by mouth once daily       . pantoprazole (PROTONIX) 40 MG tablet Take 40 mg by mouth daily.        Marland Kitchen spironolactone (ALDACTONE) 50 MG tablet Take 50 mg by mouth daily.        . Fluticasone-Salmeterol (ADVAIR DISKUS) 100-50 MCG/DOSE AEPB Inhale 1 puff into the lungs every 12 (twelve) hours. Rinse mouth with water after use as needed       . hydrocortisone (ANUSOL-HC) 25 MG suppository Use 1 suppository at bedtime for 10 days   10 suppository  1  . hyoscyamine (LEVSIN/SL) 0.125 MG SL tablet Place 1 tablet (0.125 mg total) under the tongue every 6 (six) hours as needed for cramping (take 2-3 times daily as needed for cramping and spasms).  30 tablet  0  . DISCONTD: hydrocortisone (ANUSOL-HC) 25 MG suppository Place 25 mg rectally. Insert 1 per rectum nightly for 5 nights       . DISCONTD: norethindrone-ethinyl estradiol (JUNEL FE  1/20) 1-20 MG-MCG per tablet Take 1 tablet by mouth daily.           Allergies as of 11/24/2010 - Review Complete 11/24/2010  Allergen Reaction Noted  . Sertraline hcl  01/31/2008    ROS: Heavy menstrual cycles. All other systems reviewed and negative except where noted in history of present illness.  Physical Exam: General: Well developed , well nourished, no acute distress Head: Normocephalic and atraumatic Eyes:  sclerae anicteric,conjunctive pink. Ears: Normal auditory acuity Mouth: No deformity or lesions Neck: Supple, no masses.  Lungs: Clear throughout to auscultation Heart: Regular rate and rhythm; no murmurs heard Abdomen: Soft, non tender and non distended. No masses or hepatomegaly noted. Normal Bowel sounds Rectal: A few mildly inflamed internal hemorrhoids, heme neg stool. Musculoskeletal:  Symmetrical with no gross deformities  Skin: No lesions on visible extremities Extremities: No edema or deformities noted Neurological: Alert oriented x 4, grossly nonfocal Cervical Nodes:  No significant cervical adenopathy Psychological:  Alert and cooperative. Normal mood and affect  Assessment and Plan: INTERNAL HEMORRHOIDS Steroid suppositories every night for 10 days.  IRRITABLE BOWEL SYNDROME Alternating constipation and diarrhea. Trial of daily fiber. Bowel antispasmotic 2-3 times daily as needed.   Rectal bleeding Suspect secondary to the internal hemorrhoids seen on exam today. Begin steroid suppositories.  Hemorrhoids, internal Begin steroid suppositories

## 2010-11-24 NOTE — Assessment & Plan Note (Signed)
Steroid suppositories every night for 10 days.

## 2010-11-24 NOTE — Patient Instructions (Signed)
We have faxed and sent your prescriptions to [facility], [address]. We would like you to follow up with Dr. Deatra Ina or Tye Savoy ACNP in 2-3 weeks. Call our office in the next 2 weeks to make the appointment.

## 2010-11-24 NOTE — Assessment & Plan Note (Signed)
Alternating constipation and diarrhea. Trial of daily fiber. Bowel antispasmotic 2-3 times daily as needed.

## 2010-11-25 ENCOUNTER — Encounter: Payer: Self-pay | Admitting: Nurse Practitioner

## 2010-11-25 NOTE — Assessment & Plan Note (Signed)
Begin steroid suppositories

## 2010-11-30 NOTE — Progress Notes (Signed)
I agree with this plan.

## 2010-12-01 ENCOUNTER — Telehealth: Payer: Self-pay | Admitting: *Deleted

## 2010-12-01 NOTE — Telephone Encounter (Signed)
Pt had a questions on her medications.

## 2010-12-17 ENCOUNTER — Other Ambulatory Visit: Payer: Self-pay | Admitting: *Deleted

## 2010-12-17 MED ORDER — PANTOPRAZOLE SODIUM 40 MG PO TBEC: 40.0000 mg | DELAYED_RELEASE_TABLET | Freq: Every day | ORAL | Status: DC

## 2011-01-01 ENCOUNTER — Telehealth: Payer: Self-pay | Admitting: *Deleted

## 2011-01-01 MED ORDER — ALPRAZOLAM ER 1 MG PO TB24: ORAL_TABLET | ORAL | Status: DC

## 2011-01-01 NOTE — Telephone Encounter (Signed)
Px written for call in   

## 2011-01-01 NOTE — Telephone Encounter (Signed)
Patient is requesting rx for alprazolam ER 1 mg tablet to be called in to [facility] dr. Because she is gong to take before her airplane flight. We received fax from pharmacy for this request.

## 2011-01-01 NOTE — Telephone Encounter (Signed)
Medication phoned to [facility] as instructed.

## 2011-01-12 NOTE — Assessment & Plan Note (Signed)
[facility] OFFICE NOTE   NAME:Carol Johnson, Carol Johnson                       MRN:          [id number]  DATE:05/19/2007                            DOB:          Jun 27, 1966    PROBLEM:  Abdominal pain.   REASON:  [name] has returned for scheduled followup.  In late July  she developed  severe lower abdominal pain.  She was seen in the ER  where workup was negative.  On a regimen by [place] consisting of hyoscyamine  twice a day and daily Protonix, her abdominal pain has entirely  subsided.  She has very erratic bowels consisting of alternating  episodes of diarrhea and constipation.  There is no history of bleeding.  Colonoscopy in 2005 demonstrated hemorrhoids only.   PHYSICAL EXAMINATION:  Pulse 76, blood pressure 116/80, weight 216.   IMPRESSION:  Irritable bowel syndrome.   RECOMMENDATION:  1. Continue hyoscyamine as needed.  2. I have encouraged [name] to use fiber supplementation daily.     Sandy Salaam. Deatra Ina, MD,FACG  Electronically Signed    RDK/MedQ  DD: 05/19/2007  DT: 05/19/2007  Job #: [id number]   cc:   Annie Main A. Sarajane Jews, MD

## 2011-01-12 NOTE — Assessment & Plan Note (Signed)
[facility]                                 ON-CALL NOTE   NAME:Carol Johnson, Carol Johnson                         MRN:          [id number]  DATE:03/25/2007                            DOB:          Nov 22, 1965    TIME OF CALL:  8:06am   CALLER:  Mortimer Fries.   PRIMARY CARE PHYSICIAN:  Dr. Glori Bickers.   TELEPHONE NUMBER:  [phone]   The patient describes pain in the left side continuously for the past  six days. There has been no fever. There is no nausea or vomiting. For  the first three or four days, she had diarrhea. Now she has had no bowel  movement at all for the past four days. She was seen by Wyatt Mage  yesterday in the clinic and this was felt to represent a flare up of her  irritable bowel syndrome. She was given Donnatal which she has been  taking. She is no better, and in fact this morning, she is even worse  with severe pain in the left side. My response is to go to the emergency  room now.     Ishmael Holter. Sarajane Jews, MD  Electronically Signed    SAF/MedQ  DD: 03/25/2007  DT: 03/26/2007  Job #: [id number]

## 2011-01-13 ENCOUNTER — Other Ambulatory Visit: Payer: Self-pay | Admitting: *Deleted

## 2011-01-13 MED ORDER — SPIRONOLACTONE 50 MG PO TABS: 50.0000 mg | ORAL_TABLET | Freq: Every day | ORAL | Status: DC

## 2011-01-15 NOTE — Op Note (Signed)
[facility]  Patient:    Carol Johnson, Carol Johnson Visit Number: [id number] MRN: [id number]          Service Type: SUR Location: 4W 0448 01 Attending Physician:  Joya San Dictated by:   Isabel Caprice Hassell Done, M.D. Proc. Date: 10/09/01 Admit Date:  10/09/2001   CC:         Syble Creek, M.D.   Operative Report  PREOPERATIVE DIAGNOSIS:  Cholelithiasis.  POSTOPERATIVE DIAGNOSIS:  Chronic cholecystitis and cholelithiasis.  OPERATION/PROCEDURE:  Laparoscopic cholecystectomy, intraoperative cholangiogram.  SURGEON:  Isabel Caprice. Hassell Done, M.D.  ASSISTANT:  ______  ANESTHESIA:  General endotracheal.  DESCRIPTION OF PROCEDURE:  This patient was taken to room 11 on February 10 for the first part of an operation including pelvic laparoscopy towards the end of the procedure.  Four ports were placed, first with a Hasson purse-string suture in the umbilicus, and then a 10 mm in the upper midline and two 5s on the right side.  The gallbladder was grasped and elevated and Calots triangle was dissected free.  I put a clip upon the gallbladder and incised the cystic duct and inserted a Reddick catheter.  I did a dye emit cholangiogram which showed free flow into the duodenum.  No stones were noted and good intrahepatic filling was also seen.  The cystic duct was triple clipped and divided.  The cystic artery was triple clipped and divided; and the gallbladder was removed with hook electrocautery without entering it.  The gallbladder bed was cauterized with the Bovie; and then once the gallbladder was detached it was brought out through the umbilicus.  We reinspected the gallbladder bed and no bleeding was noted.  No bile leaks were seen.  Next, we waited and Dr. Rosana Hoes came in and positioned the patient.  The patient had been placed in the stirrup position throughout the case.  She was done at a gas pressure of about 11 because she had a momentary drop in  her O2 saturation which responded to decreasing the intra-abdominal pressure. Dr. Rosana Hoes then performed laparoscopy.  At the end of the case I injected all the port sites with Marcaine and closed them with 4-0 Vicryl.  I also tied down the pursestring suture at the umbilicus.  The patient seemed to tolerate the procedure well and was taken to the recovery room in satisfactory condition. Dictated by:   Isabel Caprice Hassell Done, M.D. Attending Physician:  Joya San DD:  10/09/01 TD:  10/09/01 Job: [id number]

## 2011-01-15 NOTE — H&P (Signed)
NAME:  Carol Johnson, Carol Johnson                          ACCOUNT NO.:  [id number]   MEDICAL RECORD NO.:  [id number]                   PATIENT TYPE:   LOCATION:                                       FACILITY:  [facility]   PHYSICIAN:  Lake Bells B. Rosana Hoes, M.D.               DATE OF BIRTH:  1966-06-08   DATE OF ADMISSION:  01/01/2003  DATE OF DISCHARGE:                                HISTORY & PHYSICAL   ADMISSION DIAGNOSES:  1. Term intrauterine pregnancy.  2. Persistent left upper quadrant abdominal pain and left-sided chest pain.   HISTORY OF PRESENT ILLNESS:  [age]-year-old white female, gravida 2,  para 0, A 1 at 40 weeks 0 days who presents for admission for cervical  ripening and induction of labor due to apparent symptomatic uterine fibroids  at term.  The patient has been on multiple occasions in the office and here  at the hospital for left-sided upper abdominal pain and chest pain.  She has  had an evaluation to include EKG, chest x-ray and ultimately CT of the  chest, all of which were negative for any acute disease.  The patient is  known to have multiple uterine fibroids, the largest of which is 8 cm,  anterior and laterally to the left in the vicinity of the patient's pain.  Given that there has been no other cause identified for the pain I presume  this is due to fibroids, one of which may be degenerating or simply causing  pressure type pain due to bulk.  Given she is at term she presents for  induction of labor for relief of these symptoms.   PRENATAL HISTORY:  Prenatal care at [facility], Dr. Rosana Hoes.  History of  asthma, which has been well-controlled with p.r.n. albuterol.  History of  reflux, taking Nexium.   PAST MEDICAL HISTORY:  The past medical history is otherwise negative.   PAST SURGICAL HISTORY:  Cholecystectomy and diagnostic laparoscopy for  infertility, which simply showed multiple uterine fibroids, which were  predominantly subserosal.   FAMILY HISTORY:   Heart disease, COPD, thyroid disease and strokes.   SOCIAL HISTORY:  No alcohol, tobacco or drugs.   MEDICATIONS:  1. Prenatal vitamins.  2. Darvocet 2 tablets every 4-6 hours for the above-mentioned pain.   ALLERGIES:  None.   PRENATAL LABORATORY DATA:  Blood type O positive.  Rubella immune.  HIV,  hepatitis B, RPR, GC, and Chlamydia were all negative.  Group B Strep  negative.  Glucola elevated.  Three-hours glucose tolerance test within  normal limits.   PHYSICAL EXAMINATION:  VITAL SIGNS:  Blood pressure 100/78.  Fetal heart  tones 143.  Weight 215.  GENERAL:  Alert and oriented, and in no acute distress.  SKIN:  Warm with no lesions.  HEART:  Regular rate and rhythm.  LUNGS:  Clear to auscultation  ABDOMEN:  Gravid.  Fundal height 43 cm.  Uterus consistent with multiple  uterine fibroids and term pregnancy.  PELVIC EXAMINATION:  Cervix is closed, thick, high and posterior.  Vertex  confirmed on ultrasound.  Estimated fetal weight 8 pounds 3 ounces (3727  grams).  Normal AFI.  Multiple fibroids noted as mentioned above.   ASSESSMENT:  1. Term intrauterine pregnancy.  2. Severe left upper quadrant pain presumably due to large uterine fibroids.   PLAN:  Admission for induction of labor.                                                 Lake Bells B. Rosana Hoes, M.D.    WBD/MEDQ  D:  01/01/2003  T:  01/01/2003  Job:  [id number]

## 2011-01-15 NOTE — H&P (Signed)
[facility]  Patient:    Carol Johnson, Carol Johnson Visit Number: [id number] MRN: [id number]          Service Type: Attending:  Syble Creek, M.D. Dictated by:   Syble Creek, M.D. Adm. Date:  10/09/01                           History and Physical  CHIEF COMPLAINT:  Dysmenorrhea and a history of difficulty achieving pregnancy.  HISTORY OF PRESENT ILLNESS:  A 45 year old white female, gravida 1, para 0, A1, presents for surgical evaluation of persistent dysmenorrhea and history of difficulty achieving pregnancy.  Has a known history of small uterine fibroids; however, no other obvious cause for dysmenorrhea has been determined.  The patient is undergoing a laparoscopic cholecystectomy by Dr. Hassell Done, and therefore at the same time we will perform a diagnostic laparoscopy of the pelvis to further investigate her dysmenorrhea and difficulty achieving pregnancy.  PAST MEDICAL HISTORY:  Seasonal allergies, otherwise negative.  PAST SURGICAL HISTORY:  None.  PAST OBSTETRIC HISTORY:  TAB 15 years ago without difficulty.  MEDICATIONS:  Allegra, Entex, Flonase, AeroBid, albuterol, Atarax, and Prilosec.  ALLERGIES:  None.  SOCIAL HISTORY:  Patient occasionally smokes, social alcohol, no other drugs.  FAMILY HISTORY:  Father died of pulmonary fibrosis.  Mother had palpitations and syncope.  Brother with epilepsy.  REVIEW OF SYSTEMS:  Otherwise noncontributory.  PHYSICAL EXAMINATION:  VITAL SIGNS:  Blood pressure 110/64, pulse 64, weight 164.  GENERAL:  Alert and oriented, in no acute distress.  SKIN:  Warm and dry, no lesions.  NECK:  Supple, no thyromegaly.  CHEST:  Lungs are clear.  CARDIAC:  Regular rate and rhythm.  ABDOMEN:  Liver, spleen normal.  No hernia.  LYMPH NODE SURGERY:  Negative in the neck, axilla, and groins.  BREASTS:  No dominant masses, nipple discharge, or adenopathy.  PELVIC:  Normal external genitalia.  Vagina and cervix  normal.  Uterus is difficult to estimate size due to her obesity, however does not feel grossly enlarged.  Adnexa:  No palpable abnormalities or tenderness.  ASSESSMENT:  History of dysmenorrhea, known small uterine fibroids, difficulty achieving pregnancy.  Is presenting for a laparoscopic cholecystectomy by Dr. Hassell Done, and I will perform a diagnostic laparoscopy of the pelvis with chromotubation, given her history.  I did discuss with the patient the potential for CO2 laser treatment if endometriosis is diagnosed.  Operative risks discussed, including infection, bleeding, damage to bowel, bladder, or surrounding organs was discussed. Dictated by:   Syble Creek, M.D. Attending:  Syble Creek, M.D. DD:  09/14/01 TD:  09/14/01 Job: [id number]

## 2011-01-15 NOTE — Discharge Summary (Signed)
NAME:  Carol Johnson, MCGAHEE                          ACCOUNT NO.:  [id number]   MEDICAL RECORD NO.:  [id number]                   PATIENT TYPE:  INP   LOCATION:  9143                                 FACILITY:  [facility]   PHYSICIAN:  Laurel B. Rosana Hoes, M.D.               DATE OF BIRTH:  [birth year]   DATE OF ADMISSION:  01/01/2003  DATE OF DISCHARGE:  01/06/2003                                 DISCHARGE SUMMARY   ADMISSION DIAGNOSES:  1. 40-week intrauterine pregnancy.  2. Severe and persistent left upper quadrant abdominal pain and left-sided     chest pain.   PROCEDURE:  1. Admission for induction of labor.  2. Primary low transverse cesarean section for failure to progress beyond 4     cm despite adequate labor.   HISTORY OF PRESENT ILLNESS:  For complete details please see the history and  physical on chart.  Briefly, the patient was admitted as a 45 year old white  female gravida 2, para 0, A1 at 40 weeks, 0 days for cervical ripening and  induction of labor due to severe left upper quadrant abdominal  pain and  left lower chest pain.  The patient had been seen on multiple occasions in  the office and maternity admission over the previous few days for both  complaints.  Had an evaluation to include EKG, chest x-ray and CT scan of  the chest.  All of which were negative for any acute disease.  The patient  is known to have multiple uterine fibroids.  One of which was 8 cm located  anteriorly and bilaterally to the left in the vicinity of the patient's  pain.  It was thought to be contributing to the patient's severe pain  requiring narcotic medication.  Given the severe nature of the pain, the  patient was admitted for induction of labor.   HOSPITAL COURSE:  The patient was admitted to labor and delivery and  cervical ripening began on May 4 with Cervidil.  Subsequently, started on  Pitocin May 5.  Continued throughout the day, the following night into the  next day.  Amniotomy was  performed.  The patient ultimately progressed to 4  cm, but would not change beyond this.  Adequate contractions were documented  with IUPC with moderate varying decelerations greater than 200 for greater  than two hours.  Given failure to progress, the patient was delivered via  primary cesarean section.   The patient was delivered of a viable female infant.  Apgars 8 and 8.  Weight  was 8 pounds 1 ounce.  There was multiple fibroids noted and normal  appearing tubes and ovaries.  Nuchal cord x1.   Postoperatively, the patient rapidly began to ambulate, void and tolerate a  regular diet.  She had no further issues with  pain as she had prior to  admission.   DISCHARGE INSTRUCTIONS:  Standard preprinted instructions given prior  to  dismissal.   DISCHARGE MEDICATIONS:  1. Tylox one to two p.o. q.4-6h p.r.n. pain.  2. ________ [place] Lewis one p.o. daily.    FOLLOW UP:  [facility] in four weeks.   DISPOSITION AT DISCHARGE:  Satisfactory.                                                 Lake Bells B. Rosana Hoes, M.D.    WBD/MEDQ  D:  02/05/2003  T:  02/05/2003  Job:  [id number]

## 2011-01-15 NOTE — Op Note (Signed)
NAME:  Carol Johnson, Carol Johnson                          ACCOUNT NO.:  [id number]   MEDICAL RECORD NO.:  [id number]                   PATIENT TYPE:  INP   LOCATION:  9143                                 FACILITY:  [facility]   PHYSICIAN:  Grand B. Rosana Hoes, M.D.               DATE OF BIRTH:  April 01, 1966   DATE OF PROCEDURE:  01/03/2003  DATE OF DISCHARGE:                                 OPERATIVE REPORT   PREOPERATIVE DIAGNOSES:  1. A 40-week intrauterine pregnancy.  2. Symptomatic left upper quadrant pain presumably due to fibroid uterus.  3. Failure to progress.   POSTOPERATIVE DIAGNOSES:  1. A 40-week intrauterine pregnancy.  2. Symptomatic left upper quadrant pain presumably due to fibroid uterus.  3. Failure to progress.   PROCEDURE:  Primary low transverse cesarean section.   SURGEON:  Blair Dolphin. Rosana Hoes, M.D.   ASSISTANT:  Lovenia Kim, M.D.   ANESTHESIA:  Spinal.   FINDINGS:  A viable female infant, 8 pounds 1 ounce, Apgars 8 and 8.  Transverse position noted.  Multiple fibroids involving the subserosal  fundal area.  Normal tubes and ovaries.   ESTIMATED BLOOD LOSS:  800.   FLUIDS REPLACED:  2300.   URINE OUTPUT:  500.   DRAINS:  Foley.   COMPLICATIONS:  None.   INDICATIONS:  The patient was being induced at 40 weeks for severe left  upper quadrant pain and known history of multiple uterine fibroids.  The  pain is presumably due to fibroids in pregnancy.  The patient was serially  induced over two days, first with Cervidil and subsequently with Pitocin.  She progressed to 4 cm but not beyond that despite adequate contractions  documented by IUPC.  Therefore, delivered by cesarean section.   DESCRIPTION OF PROCEDURE:  The patient was taken to the operating room and  spinal anesthesia obtained.  She was placed in the supine position with a  leftward tilt and prepped and draped in standard fashion and a Foley  catheter inserted into the bladder.   A Pfannenstiel incision  made with a knife and carried sharply to the  underlying fascia.  The fascia divided sharply and extended laterally with  Mayo scissors.  A Kocher clamp was used to elevate the superior aspect of  the incision and the underlying rectus muscles were dissected off sharply.  Inferiorly done in similar fashion.   The midline and rectus muscles identified, the posterior superior peritoneum  elevated and entered sharply.  The incision extended laterally sharply with  good visualization of surrounding organs.   Bladder blade inserted, bladder flap created with sharp and blunt technique.  Uterine incision made in a low transverse fashion with a knife, extended  laterally with bandage scissors.  Clear fluid noted at the amniotomy.   The infant's head was delivered through the incision, the nose and mouth  suctioned with the bulb, nuchal cord x1 noted.  Reminder of the infant  delivered without difficulty.  The cord was clamped and cut and handed off  to the waiting pediatricians.  Ancef 1 g was given at cord clamp.   The placenta was removed manually and the uterus could not be removed due to  its bulk from fibroids.  The uterus was cleared of all clots and debris.  The uterine incision was inspected, and there was an incision at the left  lateral aspect inferiorly toward the vagina.  This was closed first with two  layers of 0 Vicryl.  The remainder of the incision was then closed in  running locked fashion with 0 Monocryl and a second imbricating layer placed  with the same suture.  Hemostasis obtained.   The pelvis was irrigated.  The incision was inspected and was hemostatic.  Bladder flap was also hemostatic.  The subfascial space was hemostasis.  The  fascia was closed in a running stitch of 0 Vicryl.  The subcutaneous tissue  was reapproximated with interrupted plain stitches of 2-0 suture.  The skin  was closed with staples.   The patient tolerated the procedure well, and there were  no complications.  She was taken to the recovery room awake, alert, and in stable condition.  All counts were correct per the operating staff.                                               Lake Bells B. Rosana Hoes, M.D.    WBD/MEDQ  D:  01/03/2003  T:  01/04/2003  Job:  [id number]

## 2011-01-15 NOTE — Op Note (Signed)
[facility]  Patient:    [name], Carol Johnson Visit Number: [id number] MRN: [id number]          Service Type: SUR Location: 4W 0448 01 Attending Physician:  Joya San Dictated by:   Syble Creek, M.D. Proc. Date: 10/09/01 Admit Date:  10/09/2001                             Operative Report  PREOPERATIVE DIAGNOSES:       Dysmenorrhea, infertility, symptomatic cholelithiasis.  POSTOPERATIVE DIAGNOSES:      Dysmenorrhea, infertility, symptomatic cholelithiasis.  PROCEDURE:                    Diagnostic laparoscopy, chromopertubation, laparoscopic cholecystectomy (by Dr. Hassell Done).  SURGEON:                      Syble Creek, M.D.  ASSISTANT:                    Isabel Caprice. Hassell Done, M.D.  ANESTHESIA:                   General.  FINDINGS:                     Uterus:  Multiple subserosal fibroids.  Tubes and ovaries appeared normal.  No evidence of any endometriosis in the pelvis. Tubes spilled bilaterally with chromopertubation.  ESTIMATED BLOOD LOSS:         Less than 50 cc.  COMPLICATIONS:                None.  PROCEDURE:                    Patient was in the operating room under general anesthesia status post laparoscopic cholecystectomy when I arrived.  Patient already had her ports inserted for her laparoscopic cholecystectomy.  The vagina had been prepped and draped.  I therefore inserted a speculum and placed a single tooth tenaculum and attached an acorn cannula with indigo carmine dye already flushed into the tube.  I then changed gown and globes and turned attention to the abdomen.  Patient was placed in the Trendelenburg position and the bowel immobilized superiorly with blunt probe.  I inserted a 5 mm trocar in the left lower quadrant under direct laparoscopic visualization.  The pelvis was inspected with the above findings noted.  Multiple subserosal fibroids were seen and no evidence of endometriosis was found.  Indigo  carmine dye was injected approximately 60 cc of diluted solution and free spill was noted bilaterally.  Therefore, my portion of the procedure was terminated.  The left lower quadrant port was removed and the remainder of the procedure continued by Dr. Hassell Done.  Patient tolerated the procedure well.  There were no complications.  The patient was left with Dr. Hassell Done for closure of the previously placed laparoscopic ports. Dictated by:   Syble Creek, M.D. Attending Physician:  Joya San DD:  10/09/01 TD:  10/09/01 Job: [id number]

## 2011-01-18 ENCOUNTER — Other Ambulatory Visit: Payer: Self-pay

## 2011-01-18 MED ORDER — FEXOFENADINE HCL 180 MG PO TABS: 180.0000 mg | ORAL_TABLET | Freq: Every day | ORAL | Status: DC

## 2011-01-18 NOTE — Telephone Encounter (Signed)
Medication phoned to [place] as instructed.

## 2011-01-18 NOTE — Telephone Encounter (Signed)
Px written for call in   

## 2011-01-19 ENCOUNTER — Other Ambulatory Visit: Payer: Self-pay | Admitting: Obstetrics and Gynecology

## 2011-01-19 DIAGNOSIS — Z1231 Encounter for screening mammogram for malignant neoplasm of breast: Secondary | ICD-10-CM

## 2011-02-15 ENCOUNTER — Ambulatory Visit
Admission: RE | Admit: 2011-02-15 | Discharge: 2011-02-15 | Disposition: A | Discharge status: Home / Self Care | Payer: BC Managed Care – PPO | Source: Ambulatory Visit | Attending: Obstetrics and Gynecology | Admitting: Obstetrics and Gynecology

## 2011-02-15 DIAGNOSIS — Z1231 Encounter for screening mammogram for malignant neoplasm of breast: Secondary | ICD-10-CM

## 2011-03-04 ENCOUNTER — Other Ambulatory Visit: Payer: Self-pay | Admitting: Family Medicine

## 2011-04-17 ENCOUNTER — Other Ambulatory Visit: Payer: Self-pay | Admitting: Family Medicine

## 2011-04-19 ENCOUNTER — Encounter: Payer: Self-pay | Admitting: Family Medicine

## 2011-04-19 NOTE — Telephone Encounter (Signed)
Left message on pt's cell, [phone], advising her that she needs to call for office visit.

## 2011-04-19 NOTE — Telephone Encounter (Signed)
[facility] electronically request refill Spironolactone 50 mg # 30 x 0. Note attached pt must call for appt. Unable to reach pt at all contact #s.

## 2011-04-20 ENCOUNTER — Encounter: Payer: Self-pay | Admitting: Family Medicine

## 2011-04-20 ENCOUNTER — Ambulatory Visit (INDEPENDENT_AMBULATORY_CARE_PROVIDER_SITE_OTHER): Payer: BC Managed Care – PPO | Admitting: Family Medicine

## 2011-04-20 DIAGNOSIS — F329 Major depressive disorder, single episode, unspecified: Secondary | ICD-10-CM

## 2011-04-20 DIAGNOSIS — E041 Nontoxic single thyroid nodule: Secondary | ICD-10-CM

## 2011-04-20 DIAGNOSIS — E785 Hyperlipidemia, unspecified: Secondary | ICD-10-CM

## 2011-04-20 DIAGNOSIS — F411 Generalized anxiety disorder: Secondary | ICD-10-CM

## 2011-04-20 DIAGNOSIS — I10 Essential (primary) hypertension: Secondary | ICD-10-CM

## 2011-04-20 LAB — COMPREHENSIVE METABOLIC PANEL
ALT: 13 U/L (ref 0–35)
AST: 16 U/L (ref 0–37)
Albumin: 3.7 g/dL (ref 3.5–5.2)
Alkaline Phosphatase: 64 U/L (ref 39–117)
BUN: 9 mg/dL (ref 6–23)
Calcium: 9.2 mg/dL (ref 8.4–10.5)
Chloride: 107 mEq/L (ref 96–112)
Potassium: 4.1 mEq/L (ref 3.5–5.1)
Sodium: 140 mEq/L (ref 135–145)
Total Protein: 6.8 g/dL (ref 6.0–8.3)

## 2011-04-20 LAB — CBC WITH DIFFERENTIAL/PLATELET
Basophils Absolute: 0 10*3/uL (ref 0.0–0.1)
Basophils Relative: 0.4 % (ref 0.0–3.0)
Eosinophils Absolute: 0.3 10*3/uL (ref 0.0–0.7)
Lymphocytes Relative: 24.6 % (ref 12.0–46.0)
MCHC: 33.7 g/dL (ref 30.0–36.0)
MCV: 88.7 fl (ref 78.0–100.0)
Monocytes Absolute: 0.4 10*3/uL (ref 0.1–1.0)
Neutrophils Relative %: 67.2 % (ref 43.0–77.0)
Platelets: 293 10*3/uL (ref 150.0–400.0)
RBC: 4.39 Mil/uL (ref 3.87–5.11)

## 2011-04-20 LAB — LDL CHOLESTEROL, DIRECT: Direct LDL: 147.6 mg/dL

## 2011-04-20 LAB — LIPID PANEL
Cholesterol: 217 mg/dL — ABNORMAL HIGH (ref 0–200)
Total CHOL/HDL Ratio: 4
VLDL: 27.8 mg/dL (ref 0.0–40.0)

## 2011-04-20 MED ORDER — SPIRONOLACTONE 50 MG PO TABS: 50.0000 mg | ORAL_TABLET | Freq: Every day | ORAL | Status: DC

## 2011-04-20 NOTE — Assessment & Plan Note (Signed)
Due for check  May be up with poor diet and wt gain  Disc low sat fat diet in detail Rev last labs F/u 6 mo

## 2011-04-20 NOTE — Assessment & Plan Note (Signed)
A bit worse since her husb was laid off  Uses xanax sparingly for travel and emergencies  Will continue prn and update if worse

## 2011-04-20 NOTE — Progress Notes (Signed)
Subjective:    Patient ID: Carol Johnson, female    DOB: 05/06/1966, 45 y.o.   MRN: [id number]  HPI Here for f/u of anxiety/depression and HTN  And hyperlipidemia  Is working a lot - taking care of herself   Cyst on her L side of thyroid has grown a little  Was a "wellness screen " -- will - bring ultrasound -- does not feel pressure or pain and not on any thyroid med   Wt is up 6 lb with bmi of 35 now  Mood - on wellbutrin  Works fairly well - notices when she misses a dose  Only takes xanax when she flies  Some stress -husband lost his job  He is looking for a job and had several interviews    HTN in good control 116/78- no cp or ha or palpitations   Diet- slack on it lately- some stress eating  Exercise slack on it - due to stress  Up 6 pounds   Is due for HTN and lipid labs  Lab Results  Component Value Date   CHOL 214* 02/24/2010   CHOL 212* 08/27/2009   CHOL 209* 09/09/2006   Lab Results  Component Value Date   HDL 45.50 02/24/2010   HDL 33.90* 08/27/2009   HDL 47 09/09/2006   Lab Results  Component Value Date   LDLCALC 134* 09/09/2006   Lab Results  Component Value Date   TRIG 264.0* 02/24/2010   TRIG 333.0* 08/27/2009   TRIG 138 09/09/2006   Lab Results  Component Value Date   CHOLHDL 5 02/24/2010   CHOLHDL 6 08/27/2009   CHOLHDL 4.4 Ratio 09/09/2006   Lab Results  Component Value Date   LDLDIRECT 127.8 02/24/2010   LDLDIRECT 133.7 08/27/2009    Lipids not too bad but could be better  Diet has been slack- more sat fats lately  Patient Active Problem List  Diagnoses  . FIBROIDS, UTERUS  . THYROID CYST  . HYPERLIPIDEMIA  . ANEMIA  . ANXIETY  . DEPRESSION  . HYPERTENSION  . INTERNAL HEMORRHOIDS  . ALLERGIC RHINITIS  . ASTHMA  . GERD  . IRRITABLE BOWEL SYNDROME  . DYSPNEA ON EXERTION  . GASTRIC ULCER, HX OF  . Rectal bleeding  . Hemorrhoids, internal  . Irritable bowel syndrome (IBS)   Past Medical History  Diagnosis Date  . Internal  hemorrhoid   . GERD (gastroesophageal reflux disease)   . Irritable bowel syndrome   . Fibroid uterus   . Allergic rhinitis   . Hypercholesterolemia   . Asthma   . URI (upper respiratory infection)   . Anxiety   . Depression   . Abdominal pain, unspecified site   . Pneumonia 1998   Past Surgical History  Procedure Date  . Cholecystectomy   . Myomectomy   . Uterine fibroid surgery    History  Substance Use Topics  . Smoking status: Never Smoker   . Smokeless tobacco: Not on file   Comment: as a teenager  . Alcohol Use: 0.5 oz/week    1 drink(s) per week   Family History  Problem Relation Age of Onset  . Colon cancer Paternal Aunt   . Uterine cancer Paternal Grandmother   . Heart disease Maternal Grandmother   . Heart disease Paternal Uncle   . Heart disease Paternal Aunt   . Kidney disease Cousin    Allergies  Allergen Reactions  . Sertraline Hcl     REACTION: diarrhea   Current Outpatient  Prescriptions on File Prior to Visit  Medication Sig Dispense Refill  . buPROPion (WELLBUTRIN XL) 150 MG 24 hr tablet Take 150 mg by mouth daily.        . fexofenadine ([facility] ALLERGY RELIEF) 180 MG tablet Take 1 tablet (180 mg total) by mouth daily.  30 tablet  11  . Norethin Ace-Eth Estrad-FE (LOESTRIN 24 FE PO) Take by mouth. Take 1 tablet by mouth once daily       . pantoprazole (PROTONIX) 40 MG tablet Take 1 tablet (40 mg total) by mouth daily.  60 tablet  6  . ALPRAZolam (XANAX XR) 1 MG 24 hr tablet 1 by mouth times one as needed for airplane flight Do not drive with this - is sedating  4 tablet  0  . diclofenac (VOLTAREN) 75 MG EC tablet Take one by mouth three times a day as needed for pain with food      . fluticasone (FLONASE) 50 MCG/ACT nasal spray 2 sprays by Nasal route daily.        . Fluticasone-Salmeterol (ADVAIR DISKUS) 100-50 MCG/DOSE AEPB Inhale 1 puff into the lungs every 12 (twelve) hours. Rinse mouth with water after use as needed       . hydrocortisone  (ANUSOL-HC) 25 MG suppository Use 1 suppository at bedtime for 10 days   10 suppository  1     Review of Systems Review of Systems  Constitutional: Negative for fever, appetite change, fatigue and unexpected weight change.  Eyes: Negative for pain and visual disturbance.  Respiratory: Negative for cough and shortness of breath.   Cardiovascular: Negative.  for cp or sob  Gastrointestinal: Negative for nausea, diarrhea and constipation.  Genitourinary: Negative for urgency and frequency.  Skin: Negative for pallor. or rash Neurological: Negative for weakness, light-headedness, numbness and headaches.  Hematological: Negative for adenopathy. Does not bruise/bleed easily.  Psychiatric/Behavioral: depression is controlled/ anxiety comes up occasionally          Objective:   Physical Exam  Constitutional: She appears well-developed and well-nourished. No distress.       overwt and well appearing   HENT:  Head: Normocephalic and atraumatic.  Mouth/Throat: Oropharynx is clear and moist.  Eyes: Conjunctivae and EOM are normal. Pupils are equal, round, and reactive to light.  Neck: Normal range of motion. Neck supple. No JVD present. Carotid bruit is not present. No thyromegaly present.  Cardiovascular: Normal rate, regular rhythm, normal heart sounds and intact distal pulses.   No murmur heard. Pulmonary/Chest: Effort normal and breath sounds normal. No respiratory distress. She has no wheezes. She exhibits no tenderness.  Abdominal: Soft. Bowel sounds are normal. She exhibits no distension, no abdominal bruit and no mass. There is no tenderness.  Musculoskeletal: Normal range of motion. She exhibits no edema and no tenderness.  Lymphadenopathy:    She has no cervical adenopathy.  Neurological: She is alert. She has normal reflexes. No cranial nerve deficit. Coordination normal.  Skin: Skin is warm and dry. No rash noted. No erythema. No pallor.  Psychiatric: She has a normal mood and  affect.          Assessment & Plan:

## 2011-04-20 NOTE — Assessment & Plan Note (Signed)
Overall doing well with wellbutrin  Will continue that  Disc imp of exercise

## 2011-04-20 NOTE — Patient Instructions (Addendum)
Labs today Please drop off your thyroid ultrasound report for me to review Work on weight with diet and exercise  Blood pressure is good continue current medicines  Exercise will help stress Follow up in 6 months

## 2011-04-20 NOTE — Assessment & Plan Note (Signed)
Per pt ? Growth Will bring report for me to review No clinical change or exam change

## 2011-04-20 NOTE — Assessment & Plan Note (Signed)
Good control with spironolactone Lab today Disc diet and exercise for wt loss Fu 6 mo

## 2011-05-04 ENCOUNTER — Telehealth: Payer: Self-pay | Admitting: Family Medicine

## 2011-05-04 DIAGNOSIS — E041 Nontoxic single thyroid nodule: Secondary | ICD-10-CM

## 2011-05-04 NOTE — Telephone Encounter (Signed)
Pt's recent screening indicated L thyroid was large  She agreed to thyroid US for further eval I will order

## 2011-05-07 ENCOUNTER — Ambulatory Visit: Payer: Self-pay | Admitting: Family Medicine

## 2011-05-10 ENCOUNTER — Encounter: Payer: Self-pay | Admitting: Family Medicine

## 2011-05-16 ENCOUNTER — Telehealth: Payer: Self-pay | Admitting: Family Medicine

## 2011-05-16 DIAGNOSIS — E041 Nontoxic single thyroid nodule: Secondary | ICD-10-CM

## 2011-05-16 NOTE — Telephone Encounter (Signed)
I will do ref to endo

## 2011-05-16 NOTE — Telephone Encounter (Signed)
Message copied by Abner Greenspan on Sun May 16, 2011 11:11 AM ------      Message from: Josetta Huddle      Created: Fri May 14, 2011  1:46 PM       Patient advised.  She would like to get the referral to the endocrinologist and if that is not soon, she would like to schedule appt with Dr. Glori Bickers to discuss.  Please send referral to [facility].

## 2011-05-18 ENCOUNTER — Other Ambulatory Visit: Payer: Self-pay | Admitting: *Deleted

## 2011-05-18 MED ORDER — ALPRAZOLAM ER 1 MG PO TB24: ORAL_TABLET | ORAL | Status: DC

## 2011-05-18 NOTE — Telephone Encounter (Signed)
Px written for call in   

## 2011-05-18 NOTE — Telephone Encounter (Signed)
Medication phoned [facility] as instructed.

## 2011-06-14 LAB — COMPREHENSIVE METABOLIC PANEL
AST: 30
CO2: 22
Calcium: 8.9
Creatinine, Ser: 0.63
GFR calc Af Amer: >60
GFR calc non Af Amer: >60
Total Protein: 6.6

## 2011-06-14 LAB — URINALYSIS, ROUTINE W REFLEX MICROSCOPIC
Hgb urine dipstick: NEGATIVE
Nitrite: NEGATIVE
Protein, ur: NEGATIVE
Specific Gravity, Urine: 1.026
Urobilinogen, UA: 0.2

## 2011-06-14 LAB — CBC
MCHC: 34.4
MCV: 80.5
RBC: 4.64
RDW: 13.7

## 2011-06-14 LAB — DIFFERENTIAL
Basophils Absolute: 0
Basophils Relative: 0
Eosinophils Absolute: 0.5
Eosinophils Relative: 6 — ABNORMAL HIGH
Lymphs Abs: 1.7

## 2011-06-14 LAB — URINE MICROSCOPIC-ADD ON

## 2011-06-14 LAB — LIPASE, BLOOD: Lipase: 14

## 2011-10-14 ENCOUNTER — Telehealth: Payer: Self-pay | Admitting: Family Medicine

## 2011-10-14 DIAGNOSIS — E785 Hyperlipidemia, unspecified: Secondary | ICD-10-CM

## 2011-10-14 DIAGNOSIS — Z0000 Encounter for general adult medical examination without abnormal findings: Secondary | ICD-10-CM

## 2011-10-14 DIAGNOSIS — D649 Anemia, unspecified: Secondary | ICD-10-CM

## 2011-10-14 NOTE — Telephone Encounter (Signed)
Message copied by Abner Greenspan on Thu Oct 14, 2011  6:26 PM ------      Message from: Marchia Bond      Created: Thu Oct 07, 2011  9:37 AM      Regarding: Cpx labs Fri  2/15       Please order  future cpx labs for pt's upcomming lab appt.      Thanks      Aniceto Boss

## 2011-10-15 ENCOUNTER — Other Ambulatory Visit: Payer: BC Managed Care – PPO

## 2011-10-22 ENCOUNTER — Ambulatory Visit: Payer: BC Managed Care – PPO | Admitting: Family Medicine

## 2011-11-01 ENCOUNTER — Other Ambulatory Visit: Payer: BC Managed Care – PPO

## 2011-11-08 ENCOUNTER — Ambulatory Visit: Payer: BC Managed Care – PPO | Admitting: Family Medicine

## 2011-11-08 ENCOUNTER — Other Ambulatory Visit (INDEPENDENT_AMBULATORY_CARE_PROVIDER_SITE_OTHER): Payer: BC Managed Care – PPO

## 2011-11-08 DIAGNOSIS — E785 Hyperlipidemia, unspecified: Secondary | ICD-10-CM

## 2011-11-08 DIAGNOSIS — D649 Anemia, unspecified: Secondary | ICD-10-CM

## 2011-11-08 DIAGNOSIS — Z0000 Encounter for general adult medical examination without abnormal findings: Secondary | ICD-10-CM

## 2011-11-08 LAB — COMPREHENSIVE METABOLIC PANEL
ALT: 19 U/L (ref 0–35)
Alkaline Phosphatase: 56 U/L (ref 39–117)
CO2: 24 mEq/L (ref 19–32)
Sodium: 138 mEq/L (ref 135–145)
Total Bilirubin: 0.1 mg/dL — ABNORMAL LOW (ref 0.3–1.2)
Total Protein: 6.9 g/dL (ref 6.0–8.3)

## 2011-11-08 LAB — CBC WITH DIFFERENTIAL/PLATELET
Basophils Absolute: 0 10*3/uL (ref 0.0–0.1)
HCT: 41.8 % (ref 36.0–46.0)
Lymphs Abs: 3 10*3/uL (ref 0.7–4.0)
Monocytes Absolute: 0.6 10*3/uL (ref 0.1–1.0)
Monocytes Relative: 5.5 % (ref 3.0–12.0)
Neutrophils Relative %: 61.3 % (ref 43.0–77.0)
Platelets: 331 10*3/uL (ref 150.0–400.0)
RDW: 13.5 % (ref 11.5–14.6)

## 2011-11-08 LAB — LDL CHOLESTEROL, DIRECT: Direct LDL: 155.6 mg/dL

## 2011-11-08 LAB — LIPID PANEL
Total CHOL/HDL Ratio: 5
VLDL: 32 mg/dL (ref 0.0–40.0)

## 2011-11-12 ENCOUNTER — Ambulatory Visit: Payer: BC Managed Care – PPO | Admitting: Family Medicine

## 2011-11-17 ENCOUNTER — Ambulatory Visit: Payer: BC Managed Care – PPO | Admitting: Family Medicine

## 2011-11-17 DIAGNOSIS — Z0289 Encounter for other administrative examinations: Secondary | ICD-10-CM

## 2011-12-22 ENCOUNTER — Other Ambulatory Visit: Payer: Self-pay | Admitting: *Deleted

## 2011-12-22 MED ORDER — PANTOPRAZOLE SODIUM 40 MG PO TBEC: 40.0000 mg | DELAYED_RELEASE_TABLET | Freq: Every day | ORAL | Status: DC

## 2011-12-22 NOTE — Telephone Encounter (Signed)
Schedule f/u when able  Will refill electronically

## 2011-12-22 NOTE — Telephone Encounter (Signed)
Received faxed refill request from pharmacy. Patient no showed for last appointment. Is it okay to refill medication?

## 2011-12-23 NOTE — Telephone Encounter (Signed)
Left message on cell phone voicemail advising patient that Rx was sent to her pharmacy and asked her to call and schedule a f/u appt with Dr. Glori Bickers.

## 2012-03-28 ENCOUNTER — Other Ambulatory Visit: Payer: Self-pay | Admitting: *Deleted

## 2012-03-28 MED ORDER — PANTOPRAZOLE SODIUM 40 MG PO TBEC: 40.0000 mg | DELAYED_RELEASE_TABLET | Freq: Every day | ORAL | Status: DC

## 2012-03-28 NOTE — Telephone Encounter (Signed)
Received faxed refill request from pharmacy. Patient due for follow-up per Dr. Glori Bickers. Called patient and scheduled follow-up appointment. Refill sent to pharmacy so patient would not run out until her appointment.

## 2012-04-06 ENCOUNTER — Other Ambulatory Visit: Payer: Self-pay

## 2012-04-17 ENCOUNTER — Ambulatory Visit (INDEPENDENT_AMBULATORY_CARE_PROVIDER_SITE_OTHER): Payer: BC Managed Care – PPO | Admitting: Family Medicine

## 2012-04-17 ENCOUNTER — Encounter: Payer: Self-pay | Admitting: Family Medicine

## 2012-04-17 VITALS — BP 120/68 | HR 79 | Temp 98.7°F | Ht 62.0 in | Wt 184.8 lb

## 2012-04-17 DIAGNOSIS — F329 Major depressive disorder, single episode, unspecified: Secondary | ICD-10-CM

## 2012-04-17 DIAGNOSIS — K219 Gastro-esophageal reflux disease without esophagitis: Secondary | ICD-10-CM

## 2012-04-17 DIAGNOSIS — E785 Hyperlipidemia, unspecified: Secondary | ICD-10-CM

## 2012-04-17 DIAGNOSIS — I10 Essential (primary) hypertension: Secondary | ICD-10-CM

## 2012-04-17 MED ORDER — BUPROPION HCL ER (XL) 300 MG PO TB24: 300.0000 mg | ORAL_TABLET | Freq: Every day | ORAL | Status: DC

## 2012-04-17 MED ORDER — PANTOPRAZOLE SODIUM 40 MG PO TBEC: 40.0000 mg | DELAYED_RELEASE_TABLET | Freq: Every day | ORAL | Status: DC

## 2012-04-17 NOTE — Progress Notes (Signed)
Subjective:    Patient ID: Carol Johnson, female    DOB: June 29, 1966, 46 y.o.   MRN: [id number]  HPI Here for f/u of chronic health conditions Has had a busy summer   Has been feeling ok overall  Really stressed out  Separated from husband- is very difficult overall  - has been together 18 years , is worried about her son who is 79 3rd new boss this year  Is still on wellbutrin - and that is helpful    bp is stable today  No cp or palpitations or headaches or edema  No side effects to medicines  She has had some high readings at a high readings - one at a wellness check 140s/90- ? Was nervous  BP Readings from Last 3 Encounters:  04/17/12 120/68  04/20/11 116/78  11/24/10 118/78       Chemistry      Component Value Date/Time   NA 138 11/08/2011 0811   K 4.3 11/08/2011 0811   CL 107 11/08/2011 0811   CO2 24 11/08/2011 0811   BUN 10 11/08/2011 0811   CREATININE 0.9 11/08/2011 0811      Component Value Date/Time   CALCIUM 9.2 11/08/2011 0811   ALKPHOS 56 11/08/2011 0811   AST 21 11/08/2011 0811   ALT 19 11/08/2011 0811   BILITOT 0.1* 11/08/2011 0811     is on aldactone  Wt is down 15 lb with bmi of 33 Diet is fair , and drinks protein drinks for lunch  Exercise- at least 5 days per week- is very good about that   Hyperlipidemia Lab Results  Component Value Date   CHOL 227* 11/08/2011   HDL 49.30 11/08/2011   LDLCALC 134* 09/09/2006   LDLDIRECT 155.6 11/08/2011   TRIG 160.0* 11/08/2011   CHOLHDL 5 11/08/2011   went to wellness check and chol was much better    Jerrye Bushy- stable on her PPI, protonix  Patient Active Problem List  Diagnosis  . FIBROIDS, UTERUS  . THYROID CYST  . HYPERLIPIDEMIA  . ANEMIA  . ANXIETY  . DEPRESSION  . HYPERTENSION  . INTERNAL HEMORRHOIDS  . ALLERGIC RHINITIS  . ASTHMA  . GERD  . IRRITABLE BOWEL SYNDROME  . DYSPNEA ON EXERTION  . GASTRIC ULCER, HX OF  . Rectal bleeding  . Hemorrhoids, internal  . Irritable bowel syndrome (IBS)  . Thyroid  nodule  . Routine general medical examination at a health care facility   Past Medical History  Diagnosis Date  . Internal hemorrhoid   . GERD (gastroesophageal reflux disease)   . Irritable bowel syndrome   . Fibroid uterus   . Allergic rhinitis   . Hypercholesterolemia   . Asthma   . URI (upper respiratory infection)   . Anxiety   . Depression   . Abdominal pain, unspecified site   . Pneumonia 1998   Past Surgical History  Procedure Date  . Cholecystectomy   . Myomectomy   . Uterine fibroid surgery    History  Substance Use Topics  . Smoking status: Never Smoker   . Smokeless tobacco: Not on file   Comment: as a teenager  . Alcohol Use: 0.5 oz/week    1 drink(s) per week   Family History  Problem Relation Age of Onset  . Colon cancer Paternal Aunt   . Uterine cancer Paternal Grandmother   . Heart disease Maternal Grandmother   . Heart disease Paternal Uncle   . Heart disease Paternal Aunt   .  Kidney disease Cousin    Allergies  Allergen Reactions  . Sertraline Hcl     REACTION: diarrhea   Current Outpatient Prescriptions on File Prior to Visit  Medication Sig Dispense Refill  . buPROPion (WELLBUTRIN XL) 150 MG 24 hr tablet Take 150 mg by mouth daily.        Marland Kitchen DOCUSATE SODIUM PO Take 1 tablet by mouth daily.        . Ferrous Sulfate Dried (FERROUS SULFATE CR PO) Take 1 tablet by mouth daily.        . fexofenadine ([facility] ALLERGY RELIEF) 180 MG tablet Take 1 tablet (180 mg total) by mouth daily.  30 tablet  11  . Norethin Ace-Eth Estrad-FE (LOESTRIN 24 FE PO) Take by mouth. Take 1 tablet by mouth once daily       . pantoprazole (PROTONIX) 40 MG tablet Take 1 tablet (40 mg total) by mouth daily.  30 tablet  0  . spironolactone (ALDACTONE) 50 MG tablet Take 1 tablet (50 mg total) by mouth daily.  30 tablet  11  . ALPRAZolam (XANAX XR) 1 MG 24 hr tablet 1 by mouth times one as needed for airplane flight Do not drive with this - is sedating  4 tablet  0  . diclofenac  (VOLTAREN) 75 MG EC tablet Take one by mouth three times a day as needed for pain with food      . fluticasone (FLONASE) 50 MCG/ACT nasal spray 2 sprays by Nasal route daily.        . Fluticasone-Salmeterol (ADVAIR DISKUS) 100-50 MCG/DOSE AEPB Inhale 1 puff into the lungs every 12 (twelve) hours. Rinse mouth with water after use as needed       . hydrocortisone (ANUSOL-HC) 25 MG suppository Use 1 suppository at bedtime for 10 days   10 suppository  1       Review of Systems Review of Systems  Constitutional: Negative for fever, appetite change, fatigue and unexpected weight change.  Eyes: Negative for pain and visual disturbance.  Respiratory: Negative for cough and shortness of breath.   Cardiovascular: Negative for cp or palpitations    Gastrointestinal: Negative for nausea, diarrhea and constipation.  Genitourinary: Negative for urgency and frequency.  Skin: Negative for pallor or rash   Neurological: Negative for weakness, light-headedness, numbness and headaches.  Hematological: Negative for adenopathy. Does not bruise/bleed easily.  Psychiatric/Behavioral: Negative for dysphoric mood. The patient is not nervous/anxious.  very stressed but overall mood has been stable, may see a counselor        Objective:   Physical Exam  Constitutional: She appears well-developed and well-nourished. No distress.       obese and well appearing   HENT:  Head: Normocephalic and atraumatic.  Mouth/Throat: Oropharynx is clear and moist.  Eyes: Conjunctivae and EOM are normal. Pupils are equal, round, and reactive to light. No scleral icterus.  Neck: Normal range of motion. Neck supple. No JVD present. Carotid bruit is not present. No thyromegaly present.  Cardiovascular: Normal rate, regular rhythm, normal heart sounds and intact distal pulses.  Exam reveals no gallop.   Pulmonary/Chest: Effort normal and breath sounds normal. No respiratory distress. She has no wheezes.  Abdominal: Soft. Bowel  sounds are normal. She exhibits no distension, no abdominal bruit and no mass. There is no tenderness.  Musculoskeletal: Normal range of motion. She exhibits no edema and no tenderness.  Lymphadenopathy:    She has no cervical adenopathy.  Neurological: She is alert. She  has normal reflexes. No cranial nerve deficit. She exhibits normal muscle tone. Coordination normal.  Skin: Skin is warm. No rash noted. No erythema. No pallor.  Psychiatric: She has a normal mood and affect. Her mood appears not anxious. Her affect is not blunt and not labile. She does not exhibit a depressed mood.          Assessment & Plan:

## 2012-04-17 NOTE — Patient Instructions (Addendum)
Good job with the weight loss Keep up the exercise If you decide to pursue counseling - let me know  Increase wellbutrin to 300 and if side effects call  Follow up in 6 months

## 2012-04-20 NOTE — Assessment & Plan Note (Signed)
Had a long disc about stressors she is going through with separation in marriage  Overall coping ok - good coping tech  No change in med May seek counseling  Will update if symptoms worsen

## 2012-04-20 NOTE — Assessment & Plan Note (Signed)
Stable without change  Controlled with protonix Disc diet

## 2012-04-20 NOTE — Assessment & Plan Note (Addendum)
bp in fair control at this time  No changes needed  Disc lifstyle change with low sodium diet and exercise   Rev last labs Lab and f/u 6 mo

## 2012-04-20 NOTE — Assessment & Plan Note (Signed)
High at last visit but markedly improved at her wellness / work assessment Disc goals for lipids and reasons to control them Rev labs with pt Rev low sat fat diet in detail

## 2012-04-28 ENCOUNTER — Other Ambulatory Visit: Payer: Self-pay | Admitting: Family Medicine

## 2012-04-28 NOTE — Telephone Encounter (Signed)
[facility]

## 2012-10-18 ENCOUNTER — Ambulatory Visit: Payer: BC Managed Care – PPO | Admitting: Family Medicine

## 2012-10-18 DIAGNOSIS — Z0289 Encounter for other administrative examinations: Secondary | ICD-10-CM

## 2013-04-25 ENCOUNTER — Other Ambulatory Visit: Payer: Self-pay | Admitting: *Deleted

## 2013-04-25 MED ORDER — PANTOPRAZOLE SODIUM 40 MG PO TBEC: 40.0000 mg | DELAYED_RELEASE_TABLET | Freq: Every day | ORAL | Status: DC

## 2013-04-25 MED ORDER — BUPROPION HCL ER (XL) 300 MG PO TB24: 300.0000 mg | ORAL_TABLET | Freq: Every day | ORAL | Status: DC

## 2013-05-22 ENCOUNTER — Other Ambulatory Visit: Payer: Self-pay

## 2013-05-22 DIAGNOSIS — Z1231 Encounter for screening mammogram for malignant neoplasm of breast: Secondary | ICD-10-CM

## 2013-05-25 ENCOUNTER — Other Ambulatory Visit: Payer: Self-pay | Admitting: Family Medicine

## 2013-05-25 NOTE — Telephone Encounter (Signed)
Pt  Does not have any recent or future appts. Please advise

## 2013-05-25 NOTE — Telephone Encounter (Signed)
Please schedule f/u in feb and refill until then, thanks

## 2013-06-04 MED ORDER — PANTOPRAZOLE SODIUM 40 MG PO TBEC: 40.0000 mg | DELAYED_RELEASE_TABLET | Freq: Every day | ORAL | Status: DC

## 2013-06-04 NOTE — Telephone Encounter (Signed)
Pt request call back [phone] status of refill.

## 2013-06-04 NOTE — Telephone Encounter (Signed)
F/u appt scheduled for 06/20/13 and med refilled for 1 month

## 2013-06-04 NOTE — Addendum Note (Signed)
Addended by: Tammi Sou on: 06/04/2013 04:36 PM   Modules accepted: Orders

## 2013-06-05 ENCOUNTER — Telehealth: Payer: Self-pay | Admitting: *Deleted

## 2013-06-18 ENCOUNTER — Ambulatory Visit
Admission: RE | Admit: 2013-06-18 | Discharge: 2013-06-18 | Disposition: A | Discharge status: Home / Self Care | Payer: 59 | Source: Ambulatory Visit

## 2013-06-18 DIAGNOSIS — Z1231 Encounter for screening mammogram for malignant neoplasm of breast: Secondary | ICD-10-CM

## 2013-06-20 ENCOUNTER — Ambulatory Visit: Payer: BC Managed Care – PPO | Admitting: Family Medicine

## 2013-06-20 ENCOUNTER — Ambulatory Visit: Payer: 59 | Admitting: Family Medicine

## 2013-06-26 ENCOUNTER — Encounter: Payer: Self-pay | Admitting: Family Medicine

## 2013-06-26 ENCOUNTER — Ambulatory Visit (INDEPENDENT_AMBULATORY_CARE_PROVIDER_SITE_OTHER): Payer: 59 | Admitting: Family Medicine

## 2013-06-26 VITALS — BP 130/82 | HR 78 | Temp 97.8°F | Ht 62.0 in | Wt 192.5 lb

## 2013-06-26 DIAGNOSIS — E785 Hyperlipidemia, unspecified: Secondary | ICD-10-CM

## 2013-06-26 DIAGNOSIS — Z23 Encounter for immunization: Secondary | ICD-10-CM

## 2013-06-26 DIAGNOSIS — I10 Essential (primary) hypertension: Secondary | ICD-10-CM

## 2013-06-26 DIAGNOSIS — E669 Obesity, unspecified: Secondary | ICD-10-CM

## 2013-06-26 DIAGNOSIS — Z0000 Encounter for general adult medical examination without abnormal findings: Secondary | ICD-10-CM

## 2013-06-26 LAB — CBC WITH DIFFERENTIAL/PLATELET
Eosinophils Relative: 4.8 % (ref 0.0–5.0)
Monocytes Relative: 6 % (ref 3.0–12.0)
Neutrophils Relative %: 69.5 % (ref 43.0–77.0)
Platelets: 375 10*3/uL (ref 150.0–400.0)
WBC: 10.2 10*3/uL (ref 4.5–10.5)

## 2013-06-26 LAB — LIPID PANEL
Cholesterol: 231 mg/dL — ABNORMAL HIGH (ref 0–200)
HDL: 41.4 mg/dL (ref >39.00)
Total CHOL/HDL Ratio: 6
Triglycerides: 129 mg/dL (ref 0.0–149.0)

## 2013-06-26 LAB — COMPREHENSIVE METABOLIC PANEL
ALT: 17 U/L (ref 0–35)
AST: 13 U/L (ref 0–37)
BUN: 9 mg/dL (ref 6–23)
CO2: 26 mEq/L (ref 19–32)
Calcium: 9.1 mg/dL (ref 8.4–10.5)
Creatinine, Ser: 0.7 mg/dL (ref 0.4–1.2)
Glucose, Bld: 96 mg/dL (ref 70–99)
Potassium: 4 mEq/L (ref 3.5–5.1)
Sodium: 138 mEq/L (ref 135–145)
Total Bilirubin: 0.3 mg/dL (ref 0.3–1.2)

## 2013-06-26 MED ORDER — SPIRONOLACTONE 50 MG PO TABS: 50.0000 mg | ORAL_TABLET | Freq: Every day | ORAL | Status: DC

## 2013-06-26 MED ORDER — PANTOPRAZOLE SODIUM 40 MG PO TBEC: 40.0000 mg | DELAYED_RELEASE_TABLET | Freq: Every day | ORAL | Status: DC

## 2013-06-26 MED ORDER — BUPROPION HCL ER (XL) 150 MG PO TB24: 150.0000 mg | ORAL_TABLET | Freq: Every day | ORAL | Status: DC

## 2013-06-26 NOTE — Patient Instructions (Signed)
Flu shot and Tdap today Take care of yourself- diet and exercise  Labs today I refilled medicines

## 2013-06-26 NOTE — Progress Notes (Signed)
Subjective:    Patient ID: Carol Johnson, female    DOB: [birth year], 47 y.o.   MRN: [id number]  HPI Here for f/u of chronic medical problems  Is doing well overall    Wt is up 6 lb with bmi of 35  Takes fair care of herself  Not enough time for exercise - she blames her job (changed jobs-still new )- likes it more   bp is stable.up a bit today  No cp or palpitations or headaches or edema  No side effects to medicines  BP Readings from Last 3 Encounters:  06/26/13 146/88  04/17/12 120/68  04/20/11 116/78   at the gyn it was fine recently   Hyperlipidemia  Diet controlled - diet has been fair - time limited / some fast food but not a lot of fried foods / perhaps not that healthy   Mood has been up and down with stressors  Still separated , not divorced yet  Had a break up with a boyfriend - has to deal with dating  Still taking wellbutrin  occ tearful- very very tired -lonely at times    Flu vaccine today Td 10/04-due for that also   mammo- last week -pending results from gyn Pap was 2 weeks ago-nl and no gyn problems    Patient Active Problem List   Diagnosis Date Noted  . Routine general medical examination at a health care facility 10/14/2011  . Thyroid nodule 05/16/2011  . Rectal bleeding 11/24/2010  . Hemorrhoids, internal 11/24/2010  . Irritable bowel syndrome (IBS) 11/24/2010  . ANEMIA 10/02/2009  . HYPERLIPIDEMIA 07/18/2009  . HYPERTENSION 07/18/2009  . DYSPNEA ON EXERTION 07/18/2009  . THYROID CYST 01/31/2008  . ANXIETY 01/31/2008  . DEPRESSION 01/31/2008  . GASTRIC ULCER, HX OF 01/31/2008  . FIBROIDS, UTERUS 11/13/2007  . ALLERGIC RHINITIS 11/13/2007  . ASTHMA 11/13/2007  . GERD 11/13/2007  . IRRITABLE BOWEL SYNDROME 11/13/2007  . INTERNAL HEMORRHOIDS 11/12/2003   Past Medical History  Diagnosis Date  . Internal hemorrhoid   . GERD (gastroesophageal reflux disease)   . Irritable bowel syndrome   . Fibroid uterus   . Allergic rhinitis   .  Hypercholesterolemia   . Asthma   . URI (upper respiratory infection)   . Anxiety   . Depression   . Abdominal pain, unspecified site   . Pneumonia 1998   Past Surgical History  Procedure Laterality Date  . Cholecystectomy    . Myomectomy    . Uterine fibroid surgery     History  Substance Use Topics  . Smoking status: Never Smoker   . Smokeless tobacco: Not on file     Comment: as a teenager  . Alcohol Use: 0.5 oz/week    1 drink(s) per week     Comment: occ   Family History  Problem Relation Age of Onset  . Colon cancer Paternal Aunt   . Uterine cancer Paternal Grandmother   . Heart disease Maternal Grandmother   . Heart disease Paternal Uncle   . Heart disease Paternal Aunt   . Kidney disease Cousin    Allergies  Allergen Reactions  . Sertraline Hcl     REACTION: diarrhea   Current Outpatient Prescriptions on File Prior to Visit  Medication Sig Dispense Refill  . DOCUSATE SODIUM PO Take 1 tablet by mouth daily.        . Ferrous Sulfate Dried (FERROUS SULFATE CR PO) Take 1 tablet by mouth daily.        Marland Kitchen  fluticasone (FLONASE) 50 MCG/ACT nasal spray Place 2 sprays into the nose daily as needed.       . Norethin Ace-Eth Estrad-FE (LOESTRIN 24 FE PO) Take by mouth. Take 1 tablet by mouth once daily       . ALPRAZolam (XANAX XR) 1 MG 24 hr tablet 1 by mouth times one as needed for airplane flight Do not drive with this - is sedating  4 tablet  0  . diclofenac (VOLTAREN) 75 MG EC tablet Take one by mouth three times a day as needed for pain with food       No current facility-administered medications on file prior to visit.    Review of Systems Review of Systems  Constitutional: Negative for fever, appetite change,  and unexpected weight change.pos for fatigue  Eyes: Negative for pain and visual disturbance.  Respiratory: Negative for cough and shortness of breath.   Cardiovascular: Negative for cp or palpitations    Gastrointestinal: Negative for nausea, diarrhea  and constipation.  Genitourinary: Negative for urgency and frequency.  Skin: Negative for pallor or rash   Neurological: Negative for weakness, light-headedness, numbness and headaches.  Hematological: Negative for adenopathy. Does not bruise/bleed easily.  Psychiatric/Behavioral: Negative for dysphoric mood. The patient is not nervous/anxious. Pos for stressors         Objective:   Physical Exam  Constitutional: She appears well-developed and well-nourished. No distress.  obese and well appearing   HENT:  Head: Normocephalic and atraumatic.  Right Ear: External ear normal.  Left Ear: External ear normal.  Nose: Nose normal.  Mouth/Throat: Oropharynx is clear and moist.  Eyes: Conjunctivae and EOM are normal. Pupils are equal, round, and reactive to light. Right eye exhibits no discharge. Left eye exhibits no discharge. No scleral icterus.  Neck: Normal range of motion. Neck supple. No JVD present. No thyromegaly present.  Cardiovascular: Normal rate, regular rhythm and normal heart sounds.  Exam reveals no gallop.   Pulmonary/Chest: Effort normal and breath sounds normal. No respiratory distress. She has no wheezes. She has no rales.  Abdominal: Soft. Bowel sounds are normal. She exhibits no distension and no mass. There is no tenderness.  Musculoskeletal: She exhibits no edema and no tenderness.  Lymphadenopathy:    She has no cervical adenopathy.  Neurological: She is alert. She has normal reflexes. No cranial nerve deficit. She exhibits normal muscle tone. Coordination normal.  Skin: Skin is warm and dry. No rash noted. No erythema. No pallor.  Psychiatric: She has a normal mood and affect.          Assessment & Plan:

## 2013-06-27 NOTE — Assessment & Plan Note (Signed)
Discussed how this problem influences overall health and the risks it imposes  Reviewed plan for weight loss with lower calorie diet (via better food choices and also portion control or program like weight watchers) and exercise building up to or more than 30 minutes 5 days per week including some aerobic activity    She has lacked time for self care lately

## 2013-06-27 NOTE — Assessment & Plan Note (Signed)
bp in fair control at this time  No changes needed  Disc lifstyle change with low sodium diet and exercise  Labs today

## 2013-06-27 NOTE — Assessment & Plan Note (Signed)
Lab today Disc goals for lipids and reasons to control them Rev low sat fat diet in detail

## 2013-06-27 NOTE — Assessment & Plan Note (Signed)
Reviewed health habits including diet and exercise and skin cancer prevention Also reviewed health mt list, fam hx and immunizations  Wellness lab today Flu and Tdap vaccines today  Disc imp of wt loss

## 2013-06-28 MED ORDER — ATORVASTATIN CALCIUM 10 MG PO TABS: 10.0000 mg | ORAL_TABLET | Freq: Every day | ORAL | Status: DC

## 2013-06-28 NOTE — Telephone Encounter (Signed)
Message copied by Abner Greenspan on Thu Jun 28, 2013  8:20 AM ------      Message from: Tammi Sou      Created: Wed Jun 27, 2013  4:08 PM       Pt notified of labs and Dr. Marliss Coots comments and is willing to try a statin med, pt uses [facility] on [address] ------

## 2013-06-28 NOTE — Telephone Encounter (Signed)
Please call in atorvastatin and schedule fasting lab for lipid/ast/alt dx hyperlipidemia in 6 wk thanks

## 2013-06-29 MED ORDER — ATORVASTATIN CALCIUM 10 MG PO TABS: 10.0000 mg | ORAL_TABLET | Freq: Every day | ORAL | Status: DC

## 2013-06-29 NOTE — Telephone Encounter (Signed)
Rx sent to pharmacy and pt notified and lab appt scheduled for 08/14/13

## 2013-06-29 NOTE — Addendum Note (Signed)
Addended by: Tammi Sou on: 06/29/2013 04:04 PM   Modules accepted: Orders

## 2013-06-29 NOTE — Telephone Encounter (Signed)
Left voicemail requesting pt to call office 

## 2013-08-03 ENCOUNTER — Other Ambulatory Visit: Payer: Self-pay | Admitting: Family Medicine

## 2013-08-03 DIAGNOSIS — E785 Hyperlipidemia, unspecified: Secondary | ICD-10-CM

## 2013-08-14 ENCOUNTER — Other Ambulatory Visit: Payer: 59

## 2014-04-19 ENCOUNTER — Ambulatory Visit (INDEPENDENT_AMBULATORY_CARE_PROVIDER_SITE_OTHER): Payer: 59 | Admitting: Gastroenterology

## 2014-04-19 ENCOUNTER — Encounter: Payer: Self-pay | Admitting: Gastroenterology

## 2014-04-19 ENCOUNTER — Other Ambulatory Visit (INDEPENDENT_AMBULATORY_CARE_PROVIDER_SITE_OTHER): Payer: 59

## 2014-04-19 ENCOUNTER — Telehealth: Payer: Self-pay | Admitting: Gastroenterology

## 2014-04-19 VITALS — BP 128/78 | HR 84 | Ht 61.75 in | Wt 188.1 lb

## 2014-04-19 DIAGNOSIS — K648 Other hemorrhoids: Secondary | ICD-10-CM

## 2014-04-19 DIAGNOSIS — R1084 Generalized abdominal pain: Secondary | ICD-10-CM

## 2014-04-19 DIAGNOSIS — R1013 Epigastric pain: Secondary | ICD-10-CM | POA: Insufficient documentation

## 2014-04-19 MED ORDER — HYOSCYAMINE SULFATE ER 0.375 MG PO TBCR: EXTENDED_RELEASE_TABLET | ORAL | Status: DC

## 2014-04-19 NOTE — Telephone Encounter (Signed)
Ok

## 2014-04-19 NOTE — Assessment & Plan Note (Signed)
Patient has had persistent hemorrhoidal symptoms for several years.  She has a grade 3 hemorrhoids.  Recommendations #1 to consider band ligation of internal hemorrhoids.  This issue will be addressed once her abdominal pain has resolved

## 2014-04-19 NOTE — Assessment & Plan Note (Signed)
3 day history of moderately severe upper midepigastric pain with radiation to the back raises the question of biliary tract disease from recurrent stones, ulcer and nonulcer dyspepsia, and perhaps esophageal spasm.  She has a remote history of gastric ulcer.    Recommendations #1 CBC, amylase, LFTs #2 trial of hyomax; continue Protonix #3 to consider upper endoscopy pending results of above.  Patient was instructed to call back in 3 days per

## 2014-04-19 NOTE — Patient Instructions (Signed)
Go to the basement for labs today Prescription will be sent to your pharmacy Call back on Monday to let us know how you are doing

## 2014-04-19 NOTE — Telephone Encounter (Signed)
Spoke with the patient. She has a sensation of pressure that turns into a pain when she eats or drinks. It is located upper sternal area. Belching gives very temporary lessening of the pain. She is on Protonix 36m. She increased her dosing to 2 daily without improvement. Patient requests to be seen today since she is very uncomfortable. Appointment scheduled.She denies any SOB, near syncope and the pain does not change her level of activity.

## 2014-04-19 NOTE — Progress Notes (Signed)
_                                                                                                                History of Present Illness:  Carol Johnson is a 48 year old white female referred for evaluation of abdominal pain.  For the past 3 days she's had moderately severe upper midepigastric pain that radiates to the back.  It is worsened with eating.  Nausea, vomiting, fever or jaundice.  She is doubled her Protonix without improvement.  She status post cholecystectomy at least 11 years ago.  She is on no gastric irritants.  Patient also complains of persistent hemorrhoidal problems consisting of intermittent pain, protrusion and problems with hygiene.  She's tried various topicals without improvement.   Past Medical History  Diagnosis Date  . Internal hemorrhoid   . GERD (gastroesophageal reflux disease)   . Irritable bowel syndrome   . Fibroid uterus   . Allergic rhinitis   . Hypercholesterolemia   . Asthma   . URI (upper respiratory infection)   . Anxiety   . Depression   . Abdominal pain, unspecified site   . Pneumonia 1998  . Uterine fibroid    Past Surgical History  Procedure Laterality Date  . Cholecystectomy    . Myomectomy    . Uterine fibroid surgery     family history includes Colon cancer in her paternal aunt; Heart disease in her maternal grandmother, paternal aunt, and paternal uncle; Kidney disease in her cousin; Uterine cancer in her paternal grandmother. Current Outpatient Prescriptions  Medication Sig Dispense Refill  . ALPRAZolam (XANAX XR) 1 MG 24 hr tablet 1 by mouth times one as needed for airplane flight Do not drive with this - is sedating  4 tablet  0  . atorvastatin (LIPITOR) 10 MG tablet Take 1 tablet (10 mg total) by mouth daily.  30 tablet  11  . buPROPion (WELLBUTRIN XL) 150 MG 24 hr tablet Take 1 tablet (150 mg total) by mouth daily.  30 tablet  11  . DOCUSATE SODIUM PO Take 1 tablet by mouth as needed.       . doxycycline  (VIBRAMYCIN) 100 MG capsule Take 100 mg by mouth 2 (two) times daily.      . Ferrous Sulfate Dried (FERROUS SULFATE CR PO) Take 1 tablet by mouth as needed.       . fexofenadine (ALLEGRA) 180 MG tablet Take 180 mg by mouth as needed.      . fluticasone (FLONASE) 50 MCG/ACT nasal spray Place 2 sprays into the nose daily as needed.       . Norethin Ace-Eth Estrad-FE (LOESTRIN 24 FE PO) Take by mouth. Take 1 tablet by mouth once daily       . pantoprazole (PROTONIX) 40 MG tablet Take 1 tablet (40 mg total) by mouth daily.  30 tablet  11   No current facility-administered medications for this visit.   Allergies as of 04/19/2014 - Review Complete 04/19/2014  Allergen Reaction Noted  .  Sertraline hcl  01/31/2008  . Zoloft [sertraline hcl]  04/19/2014    reports that she has never smoked. She has never used smokeless tobacco. She reports that she drinks about .5 ounces of alcohol per week. She reports that she does not use illicit drugs.   Review of Systems: Pertinent positive and negative review of systems were noted in the above HPI section. All other review of systems were otherwise negative.  Vital signs were reviewed in today's medical record Physical Exam: General: Well developed , well nourished, no acute distress Skin: anicteric Head: Normocephalic and atraumatic Eyes:  sclerae anicteric, EOMI Ears: Normal auditory acuity Mouth: No deformity or lesions Neck: Supple, no masses or thyromegaly Lungs: Clear throughout to auscultation Heart: Regular rate and rhythm; no murmurs, rubs or bruits Abdomen: Soft, non tender and non distended. No masses, hepatosplenomegaly or hernias noted. Normal Bowel sounds Rectal: There is a reducible prolapsed hemorrhoid and external skin tags Musculoskeletal: Symmetrical with no gross deformities  Skin: No lesions on visible extremities Pulses:  Normal pulses noted Extremities: No clubbing, cyanosis, edema or deformities noted Neurological: Alert  oriented x 4, grossly nonfocal Cervical Nodes:  No significant cervical adenopathy Inguinal Nodes: No significant inguinal adenopathy Psychological:  Alert and cooperative. Normal mood and affect  See Assessment and Plan under Problem List

## 2014-04-19 NOTE — Telephone Encounter (Signed)
Call to number left by patient. No answer and mailbox is full. Unable to leave a message.

## 2014-04-20 LAB — CBC WITH DIFFERENTIAL/PLATELET
BASOS PCT: 0.3 % (ref 0.0–3.0)
Basophils Absolute: 0 10*3/uL (ref 0.0–0.1)
EOS PCT: 4 % (ref 0.0–5.0)
Eosinophils Absolute: 0.4 10*3/uL (ref 0.0–0.7)
HCT: 31.8 % — ABNORMAL LOW (ref 36.0–46.0)
Hemoglobin: 10.1 g/dL — ABNORMAL LOW (ref 12.0–15.0)
LYMPHS PCT: 22.8 % (ref 12.0–46.0)
Lymphs Abs: 2.3 10*3/uL (ref 0.7–4.0)
MCHC: 31.6 g/dL (ref 30.0–36.0)
MCV: 72.2 fl — AB (ref 78.0–100.0)
MONO ABS: 0.7 10*3/uL (ref 0.1–1.0)
MONOS PCT: 7.1 % (ref 3.0–12.0)
NEUTROS PCT: 65.8 % (ref 43.0–77.0)
Neutro Abs: 6.6 10*3/uL (ref 1.4–7.7)
Platelets: 391 10*3/uL (ref 150.0–400.0)
RBC: 4.4 Mil/uL (ref 3.87–5.11)
RDW: 16.2 % — ABNORMAL HIGH (ref 11.5–15.5)
WBC: 10 10*3/uL (ref 4.0–10.5)

## 2014-04-20 LAB — HEPATIC FUNCTION PANEL
ALT: 13 U/L (ref 0–35)
AST: 17 U/L (ref 0–37)
Albumin: 3.3 g/dL — ABNORMAL LOW (ref 3.5–5.2)
Alkaline Phosphatase: 58 U/L (ref 39–117)
BILIRUBIN TOTAL: 0.3 mg/dL (ref 0.2–1.2)
Bilirubin, Direct: 0.1 mg/dL (ref 0.0–0.3)
Total Protein: 6.5 g/dL (ref 6.0–8.3)

## 2014-04-20 LAB — AMYLASE: Amylase: 39 U/L (ref 27–131)

## 2014-04-20 LAB — LIPASE: LIPASE: 15 U/L (ref 11.0–59.0)

## 2014-04-22 ENCOUNTER — Telehealth: Payer: Self-pay | Admitting: Gastroenterology

## 2014-04-22 NOTE — Telephone Encounter (Signed)
Patient agrees to schedule. She will call back if she has more questions.

## 2014-04-22 NOTE — Telephone Encounter (Signed)
The patient advised me she may have been anemic because she had a 3 week menses. She is seeing Dr Edwyna Ready for this. She has been dx with uterine fibroids and surgery has been recommended. She has started BCP's for the heavy menses. As to her pain, she states it is less intense this week but still there. She "has to take the pain medicine."

## 2014-04-22 NOTE — Telephone Encounter (Signed)
Please schedule EGD.  Inform pt that lab work was normal.

## 2014-04-23 ENCOUNTER — Telehealth: Payer: Self-pay | Admitting: Gastroenterology

## 2014-04-23 DIAGNOSIS — R1013 Epigastric pain: Secondary | ICD-10-CM

## 2014-04-23 NOTE — Telephone Encounter (Signed)
Medication called to [facility]. Per pt request, CT moved to 04/26/14 at 9:30. Message left for the patient to call.

## 2014-04-23 NOTE — Telephone Encounter (Signed)
She can have Tylenol #32 tabs every 6 hours as needed. Please schedule a CT of the abdomen and pelvis ASAP

## 2014-04-23 NOTE — Telephone Encounter (Signed)
She "had a rough night" last night. Lots of abdominal pain that was not completely relieved by the hyoscyamine. She is scheduled for EGD. She is asking if there is a stronger medication she can have in the meanwhile. The EGD is about 30 days out.

## 2014-04-23 NOTE — Telephone Encounter (Signed)
I have her scheduled for CT abd/pelvis with contrast 04/25/14. How many Tylenol #3 should I dispense? She is asking if she was to be put on ATB's at the last visit, specifically doxycycline. The note did not indicate that. I called the pharmacist about it and Doxycycline is being prescribed by another doctor. I just need to confirm with you per patient request. Thanks

## 2014-04-23 NOTE — Telephone Encounter (Signed)
Sorry, give her a 2 week supply of pain medicines.  Her PCP needs to prescribe the doxycycline

## 2014-04-25 ENCOUNTER — Other Ambulatory Visit: Payer: 59

## 2014-04-26 ENCOUNTER — Telehealth: Payer: Self-pay | Admitting: Gastroenterology

## 2014-04-26 ENCOUNTER — Ambulatory Visit (INDEPENDENT_AMBULATORY_CARE_PROVIDER_SITE_OTHER)
Admission: RE | Admit: 2014-04-26 | Discharge: 2014-04-26 | Disposition: A | Discharge status: Home / Self Care | Payer: 59 | Source: Ambulatory Visit | Attending: Gastroenterology | Admitting: Gastroenterology

## 2014-04-26 DIAGNOSIS — R1013 Epigastric pain: Secondary | ICD-10-CM

## 2014-04-26 MED ORDER — IOHEXOL 300 MG/ML  SOLN
100.0000 mL | Freq: Once | INTRAMUSCULAR | Status: AC | PRN
  Administered 2014-04-26: 100 mL via INTRAVENOUS

## 2014-04-26 NOTE — Telephone Encounter (Signed)
Advised patient the results are in and I will call her as soon as the provider reviews them

## 2014-04-29 ENCOUNTER — Encounter (HOSPITAL_COMMUNITY): Payer: Self-pay | Admitting: *Deleted

## 2014-04-29 ENCOUNTER — Encounter (HOSPITAL_COMMUNITY): Payer: Self-pay | Admitting: Pharmacy Technician

## 2014-04-29 ENCOUNTER — Other Ambulatory Visit: Payer: Self-pay

## 2014-04-29 DIAGNOSIS — R1013 Epigastric pain: Secondary | ICD-10-CM

## 2014-04-30 ENCOUNTER — Encounter (HOSPITAL_COMMUNITY): Payer: Self-pay | Admitting: Pharmacy Technician

## 2014-05-03 ENCOUNTER — Encounter (HOSPITAL_COMMUNITY): Payer: Self-pay

## 2014-05-03 ENCOUNTER — Encounter (HOSPITAL_COMMUNITY)
Admission: RE | Disposition: A | Discharge status: Home / Self Care | Payer: Self-pay | Source: Ambulatory Visit | Attending: Gastroenterology

## 2014-05-03 ENCOUNTER — Encounter (HOSPITAL_COMMUNITY): Payer: 59 | Admitting: Anesthesiology

## 2014-05-03 ENCOUNTER — Ambulatory Visit (HOSPITAL_COMMUNITY)
Admission: RE | Admit: 2014-05-03 | Discharge: 2014-05-03 | Disposition: A | Discharge status: Home / Self Care | Payer: 59 | Source: Ambulatory Visit | Attending: Gastroenterology | Admitting: Gastroenterology

## 2014-05-03 ENCOUNTER — Ambulatory Visit (HOSPITAL_COMMUNITY): Payer: 59 | Admitting: Anesthesiology

## 2014-05-03 ENCOUNTER — Encounter (HOSPITAL_COMMUNITY): Payer: Self-pay | Admitting: *Deleted

## 2014-05-03 DIAGNOSIS — F329 Major depressive disorder, single episode, unspecified: Secondary | ICD-10-CM | POA: Insufficient documentation

## 2014-05-03 DIAGNOSIS — K2940 Chronic atrophic gastritis without bleeding: Secondary | ICD-10-CM | POA: Insufficient documentation

## 2014-05-03 DIAGNOSIS — J45909 Unspecified asthma, uncomplicated: Secondary | ICD-10-CM | POA: Diagnosis not present

## 2014-05-03 DIAGNOSIS — K219 Gastro-esophageal reflux disease without esophagitis: Secondary | ICD-10-CM | POA: Diagnosis not present

## 2014-05-03 DIAGNOSIS — F17200 Nicotine dependence, unspecified, uncomplicated: Secondary | ICD-10-CM | POA: Diagnosis not present

## 2014-05-03 DIAGNOSIS — K2970 Gastritis, unspecified, without bleeding: Secondary | ICD-10-CM

## 2014-05-03 DIAGNOSIS — Z800 Family history of malignant neoplasm of digestive organs: Secondary | ICD-10-CM | POA: Diagnosis not present

## 2014-05-03 DIAGNOSIS — F411 Generalized anxiety disorder: Secondary | ICD-10-CM | POA: Insufficient documentation

## 2014-05-03 DIAGNOSIS — K319 Disease of stomach and duodenum, unspecified: Secondary | ICD-10-CM | POA: Diagnosis not present

## 2014-05-03 DIAGNOSIS — R1013 Epigastric pain: Secondary | ICD-10-CM | POA: Diagnosis present

## 2014-05-03 DIAGNOSIS — E7800 Pure hypercholesterolemia, unspecified: Secondary | ICD-10-CM | POA: Insufficient documentation

## 2014-05-03 DIAGNOSIS — E669 Obesity, unspecified: Secondary | ICD-10-CM | POA: Diagnosis not present

## 2014-05-03 DIAGNOSIS — K2990 Gastroduodenitis, unspecified, without bleeding: Secondary | ICD-10-CM

## 2014-05-03 DIAGNOSIS — F3289 Other specified depressive episodes: Secondary | ICD-10-CM | POA: Insufficient documentation

## 2014-05-03 DIAGNOSIS — I10 Essential (primary) hypertension: Secondary | ICD-10-CM | POA: Insufficient documentation

## 2014-05-03 HISTORY — PX: ESOPHAGOGASTRODUODENOSCOPY (EGD) WITH PROPOFOL: SHX5813

## 2014-05-03 SURGERY — ESOPHAGOGASTRODUODENOSCOPY (EGD) WITH PROPOFOL
Anesthesia: Monitor Anesthesia Care

## 2014-05-03 SURGERY — EGD (ESOPHAGOGASTRODUODENOSCOPY)
Anesthesia: Moderate Sedation

## 2014-05-03 MED ORDER — PROPOFOL 10 MG/ML IV BOLUS
INTRAVENOUS | Status: DC | PRN
  Administered 2014-05-03: 40 mg via INTRAVENOUS
  Administered 2014-05-03 (×4): 20 mg via INTRAVENOUS

## 2014-05-03 MED ORDER — SODIUM CHLORIDE 0.9 % IV SOLN: INTRAVENOUS | Status: DC

## 2014-05-03 MED ORDER — PROPOFOL 10 MG/ML IV BOLUS
INTRAVENOUS | Status: AC
  Filled 2014-05-03: qty 20

## 2014-05-03 MED ORDER — PROPOFOL INFUSION 10 MG/ML OPTIME
INTRAVENOUS | Status: DC | PRN
  Administered 2014-05-03: 120 ug/kg/min via INTRAVENOUS

## 2014-05-03 MED ORDER — LACTATED RINGERS IV SOLN
INTRAVENOUS | Status: DC
  Administered 2014-05-03: 1000 mL via INTRAVENOUS

## 2014-05-03 SURGICAL SUPPLY — 15 items

## 2014-05-03 NOTE — Interval H&P Note (Signed)
History and Physical Interval Note:  05/03/2014 9:03 AM  Jerilyn M Bogart  has presented today for surgery, with the diagnosis of abd.pain  The various methods of treatment have been discussed with the patient and family. After consideration of risks, benefits and other options for treatment, the patient has consented to  Procedure(s): ESOPHAGOGASTRODUODENOSCOPY (EGD) WITH PROPOFOL (N/A) as a surgical intervention .  The patient's history has been reviewed, patient examined, no change in status, stable for surgery.  I have reviewed the patient's chart and labs.  Questions were answered to the patient's satisfaction.     The recent H&P (dated *04/19/14**) was reviewed, the patient was examined and there is no change in the patients condition since that H&P was completed.   Erskine Emery  05/03/2014, 9:04 AM   Erskine Emery

## 2014-05-03 NOTE — Discharge Instructions (Addendum)
Esophagogastroduodenoscopy Care After Refer to this sheet in the next few weeks. These instructions provide you with information on caring for yourself after your procedure. Your caregiver may also give you more specific instructions. Your treatment has been planned according to current medical practices, but problems sometimes occur. Call your caregiver if you have any problems or questions after your procedure.  HOME CARE INSTRUCTIONS  Do not eat or drink anything until the numbing medicine (local anesthetic) has worn off and your gag reflex has returned. You will know that the local anesthetic has worn off when you can swallow comfortably.  Do not drive for 12 hours after the procedure or as directed by your caregiver.  Only take medicines as directed by your caregiver. SEEK MEDICAL CARE IF:   You cannot stop coughing.  You are not urinating at all or less than usual. SEEK IMMEDIATE MEDICAL CARE IF:  You have difficulty swallowing.  You cannot eat or drink.  You have worsening throat or chest pain.  You have dizziness, lightheadedness, or you faint.  You have nausea or vomiting.  You have chills.  You have a fever.  You have severe abdominal pain.  You have black, tarry, or bloody stools. Document Released: 08/02/2012 Document Reviewed: 08/02/2012 [facility] Patient Information 2015 [place]. This information is not intended to replace advice given to you by your health care provider. Make sure you discuss any questions you have with your health care provider.  Would doxycycline for one week.  If not improved okay to resume doxycycline and hold ibuprofen.

## 2014-05-03 NOTE — Anesthesia Preprocedure Evaluation (Signed)
Anesthesia Evaluation  Patient identified by MRN, date of birth, ID band Patient awake    Reviewed: Allergy & Precautions, H&P , NPO status , Patient's Chart, lab work & pertinent test results  Airway Mallampati: II TM Distance: >3 FB Neck ROM: Full    Dental no notable dental hx.    Pulmonary shortness of breath and with exertion, asthma , pneumonia -, resolved, Current Smoker,  breath sounds clear to auscultation  Pulmonary exam normal       Cardiovascular hypertension, negative cardio ROS  Rhythm:Regular Rate:Normal     Neuro/Psych PSYCHIATRIC DISORDERS Anxiety Depression negative neurological ROS     GI/Hepatic Neg liver ROS, GERD-  ,  Endo/Other  negative endocrine ROS  Renal/GU negative Renal ROS  negative genitourinary   Musculoskeletal negative musculoskeletal ROS (+)   Abdominal (+) + obese,   Peds negative pediatric ROS (+)  Hematology negative hematology ROS (+)   Anesthesia Other Findings   Reproductive/Obstetrics negative OB ROS                           Anesthesia Physical Anesthesia Plan  ASA: II  Anesthesia Plan: MAC   Post-op Pain Management:    Induction: Intravenous  Airway Management Planned:   Additional Equipment:   Intra-op Plan:   Post-operative Plan:   Informed Consent: I have reviewed the patients History and Physical, chart, labs and discussed the procedure including the risks, benefits and alternatives for the proposed anesthesia with the patient or authorized representative who has indicated his/her understanding and acceptance.   Dental advisory given  Plan Discussed with: CRNA  Anesthesia Plan Comments:         Anesthesia Quick Evaluation

## 2014-05-03 NOTE — Transfer of Care (Signed)
Immediate Anesthesia Transfer of Care Note  Patient: Carol Johnson  Procedure(s) Performed: Procedure(s) (LRB): ESOPHAGOGASTRODUODENOSCOPY (EGD) WITH PROPOFOL (N/A)  Patient Location: PACU  Anesthesia Type: MAC  Level of Consciousness: sedated, patient cooperative and responds to stimulation  Airway & Oxygen Therapy: Patient Spontanous Breathing and Patient connected to face mask oxgen  Post-op Assessment: Report given to PACU RN and Post -op Vital signs reviewed and stable  Post vital signs: Reviewed and stable  Complications: No apparent anesthesia complications

## 2014-05-03 NOTE — Anesthesia Postprocedure Evaluation (Signed)
  Anesthesia Post-op Note  Patient: Carol Johnson  Procedure(s) Performed: Procedure(s) (LRB): ESOPHAGOGASTRODUODENOSCOPY (EGD) WITH PROPOFOL (N/A)  Patient Location: PACU  Anesthesia Type: MAC  Level of Consciousness: awake and alert   Airway and Oxygen Therapy: Patient Spontanous Breathing  Post-op Pain: mild  Post-op Assessment: Post-op Vital signs reviewed, Patient's Cardiovascular Status Stable, Respiratory Function Stable, Patent Airway and No signs of Nausea or vomiting  Last Vitals:  Filed Vitals:   05/03/14 0842  BP: 166/108  Pulse: 82  Temp: 36.8 C  Resp: 19    Post-op Vital Signs: stable   Complications: No apparent anesthesia complications

## 2014-05-03 NOTE — Op Note (Addendum)
[facility] [place], [id number]   ENDOSCOPY PROCEDURE REPORT  PATIENT: Carol Johnson, Carol Johnson  MR#: [id number] BIRTHDATE: October 08, 1965 , 48  yrs. old GENDER: Female ENDOSCOPIST: Inda Castle, MD REFERRED BY: PROCEDURE DATE:  05/03/2014 PROCEDURE:  EGD w/ biopsy ASA CLASS:     Class II INDICATIONS:  Epigastric pain. MEDICATIONS: MAC sedation, administered by CRNA TOPICAL ANESTHETIC:  DESCRIPTION OF PROCEDURE: After the risks benefits and alternatives of the procedure were thoroughly explained, informed consent was obtained.  The Pentax Gastroscope [id number] endoscope was introduced through the mouth and advanced to the third portion of the duodenum. Without limitations.  The instrument was slowly withdrawn as the mucosa was fully examined.      In the gastric antrum there were multiple areas of deeply erythematous mucosa.  There is moderate edema.  Biopsies were taken.   The remainder of the upper endoscopy exam was otherwise normal.  Retroflexed views revealed no abnormalities.     The scope was then withdrawn from the patient and the procedure completed.  COMPLICATIONS: There were no complications. ENDOSCOPIC IMPRESSION: 1.   chronic gastritis 2.   The remainder of the upper endoscopy exam was otherwise normal  RECOMMENDATIONS: 1.  continue Protonix 2.  await biopsy results 3.  hold doxycycline for one week; if not improved may resume and then hold NSAIDs 4.  office visit 4-6 weeks REPEAT EXAM:  eSigned:  Inda Castle, MD 05/03/2014 10:06 AM Revised: 05/03/2014 10:06 AM  YN:[id number] Loni Muse Tower, MD

## 2014-05-03 NOTE — H&P (View-Only) (Signed)
_                                                                                                                History of Present Illness:  Carol Johnson is a 48 year old white female referred for evaluation of abdominal pain.  For the past 3 days she's had moderately severe upper midepigastric pain that radiates to the back.  It is worsened with eating.  Nausea, vomiting, fever or jaundice.  She is doubled her Protonix without improvement.  She status post cholecystectomy at least 11 years ago.  She is on no gastric irritants.  Patient also complains of persistent hemorrhoidal problems consisting of intermittent pain, protrusion and problems with hygiene.  She's tried various topicals without improvement.   Past Medical History  Diagnosis Date  . Internal hemorrhoid   . GERD (gastroesophageal reflux disease)   . Irritable bowel syndrome   . Fibroid uterus   . Allergic rhinitis   . Hypercholesterolemia   . Asthma   . URI (upper respiratory infection)   . Anxiety   . Depression   . Abdominal pain, unspecified site   . Pneumonia 1998  . Uterine fibroid    Past Surgical History  Procedure Laterality Date  . Cholecystectomy    . Myomectomy    . Uterine fibroid surgery     family history includes Colon cancer in her paternal aunt; Heart disease in her maternal grandmother, paternal aunt, and paternal uncle; Kidney disease in her cousin; Uterine cancer in her paternal grandmother. Current Outpatient Prescriptions  Medication Sig Dispense Refill  . ALPRAZolam (XANAX XR) 1 MG 24 hr tablet 1 by mouth times one as needed for airplane flight Do not drive with this - is sedating  4 tablet  0  . atorvastatin (LIPITOR) 10 MG tablet Take 1 tablet (10 mg total) by mouth daily.  30 tablet  11  . buPROPion (WELLBUTRIN XL) 150 MG 24 hr tablet Take 1 tablet (150 mg total) by mouth daily.  30 tablet  11  . DOCUSATE SODIUM PO Take 1 tablet by mouth as needed.       . doxycycline  (VIBRAMYCIN) 100 MG capsule Take 100 mg by mouth 2 (two) times daily.      . Ferrous Sulfate Dried (FERROUS SULFATE CR PO) Take 1 tablet by mouth as needed.       . fexofenadine (ALLEGRA) 180 MG tablet Take 180 mg by mouth as needed.      . fluticasone (FLONASE) 50 MCG/ACT nasal spray Place 2 sprays into the nose daily as needed.       . Norethin Ace-Eth Estrad-FE (LOESTRIN 24 FE PO) Take by mouth. Take 1 tablet by mouth once daily       . pantoprazole (PROTONIX) 40 MG tablet Take 1 tablet (40 mg total) by mouth daily.  30 tablet  11   No current facility-administered medications for this visit.   Allergies as of 04/19/2014 - Review Complete 04/19/2014  Allergen Reaction Noted  .  Sertraline hcl  01/31/2008  . Zoloft [sertraline hcl]  04/19/2014    reports that she has never smoked. She has never used smokeless tobacco. She reports that she drinks about .5 ounces of alcohol per week. She reports that she does not use illicit drugs.   Review of Systems: Pertinent positive and negative review of systems were noted in the above HPI section. All other review of systems were otherwise negative.  Vital signs were reviewed in today's medical record Physical Exam: General: Well developed , well nourished, no acute distress Skin: anicteric Head: Normocephalic and atraumatic Eyes:  sclerae anicteric, EOMI Ears: Normal auditory acuity Mouth: No deformity or lesions Neck: Supple, no masses or thyromegaly Lungs: Clear throughout to auscultation Heart: Regular rate and rhythm; no murmurs, rubs or bruits Abdomen: Soft, non tender and non distended. No masses, hepatosplenomegaly or hernias noted. Normal Bowel sounds Rectal: There is a reducible prolapsed hemorrhoid and external skin tags Musculoskeletal: Symmetrical with no gross deformities  Skin: No lesions on visible extremities Pulses:  Normal pulses noted Extremities: No clubbing, cyanosis, edema or deformities noted Neurological: Alert  oriented x 4, grossly nonfocal Cervical Nodes:  No significant cervical adenopathy Inguinal Nodes: No significant inguinal adenopathy Psychological:  Alert and cooperative. Normal mood and affect  See Assessment and Plan under Problem List

## 2014-05-06 ENCOUNTER — Encounter (HOSPITAL_COMMUNITY): Payer: Self-pay | Admitting: Gastroenterology

## 2014-05-07 ENCOUNTER — Ambulatory Visit (HOSPITAL_COMMUNITY): Admit: 2014-05-07 | Payer: Self-pay | Admitting: Gastroenterology

## 2014-05-08 ENCOUNTER — Encounter: Payer: Self-pay | Admitting: Gastroenterology

## 2014-05-10 ENCOUNTER — Encounter: Payer: 59 | Admitting: Gastroenterology

## 2014-05-29 ENCOUNTER — Encounter: Payer: 59 | Admitting: Gastroenterology

## 2014-06-04 ENCOUNTER — Other Ambulatory Visit: Payer: Self-pay | Admitting: Gastroenterology

## 2014-06-05 ENCOUNTER — Other Ambulatory Visit: Payer: Self-pay | Admitting: Gastroenterology

## 2014-06-05 NOTE — Telephone Encounter (Signed)
Dr Deatra Ina, Patient wants a refill of xanax

## 2014-06-06 ENCOUNTER — Ambulatory Visit (INDEPENDENT_AMBULATORY_CARE_PROVIDER_SITE_OTHER): Payer: 59 | Admitting: Gastroenterology

## 2014-06-06 ENCOUNTER — Encounter: Payer: Self-pay | Admitting: Gastroenterology

## 2014-06-06 VITALS — BP 144/82 | HR 76 | Ht 61.75 in | Wt 174.4 lb

## 2014-06-06 DIAGNOSIS — K2950 Unspecified chronic gastritis without bleeding: Secondary | ICD-10-CM

## 2014-06-06 NOTE — Progress Notes (Signed)
     06/06/2014 Carol Johnson [place] [id number] 08/21/1966   History of Present Illness:  This is a 48 year old female who was seen by Dr. Deatra Ina on 8/31 with complaints of upper abdominal pain.  She underwent EGD on 9/4, which showed chronic gastritis; biopsies showed reactive gastropathy with mild chronic inflammation, negative for Hpylori.  She was told to discontinue her doxycycline, which she has done and her symptoms have resolved.  She is still on pantoprazole 40 mg daily as well.  Rarely takes NSAID's just prn.    Current Medications, Allergies, Past Medical History, Past Surgical History, Family History and Social History were reviewed in [employer] record.   Physical Exam: BP 144/82  Pulse 76  Ht 5' 1.75" (1.568 m)  Wt 174 lb 6 oz (79.096 kg)  BMI 32.17 kg/m2  LMP 05/27/2014 General: Well developed white female in no acute distress Head: Normocephalic and atraumatic Eyes:  Sclerae anicteric, conjunctiva pink  Ears: Normal auditory acuity Lungs: Clear throughout to auscultation Heart: Regular rate and rhythm Abdomen: Soft, non-distended.  Normal bowel sounds.  Non-tender. Musculoskeletal: Symmetrical with no gross deformities  Extremities: No edema  Neurological: Alert oriented x 4, grossly non-focal Psychological:  Alert and cooperative. Normal mood and affect  Assessment and Recommendations: -Chronic gastritis:  Seen on EGD 04/2014.  Symptoms improved/resolved after stopping doxycycline.  Will remain off of doxycycline.  Continue pantoprazole 40 mg daily.

## 2014-06-06 NOTE — Progress Notes (Signed)
Reviewed and agree with management. Tanaya Dunigan D. Amberly Livas, M.D., FACG  

## 2014-06-06 NOTE — Patient Instructions (Signed)
Please follow up as needed or if symptoms return  Please continue your Pantoprazole

## 2014-06-29 ENCOUNTER — Other Ambulatory Visit: Payer: Self-pay | Admitting: Family Medicine

## 2014-07-01 ENCOUNTER — Other Ambulatory Visit: Payer: Self-pay | Admitting: Family Medicine

## 2014-07-01 NOTE — Telephone Encounter (Signed)
Electronic refill request, please advise  

## 2014-07-01 NOTE — Telephone Encounter (Signed)
Please schedule f/u in winter and refill until then

## 2014-07-02 NOTE — Telephone Encounter (Signed)
Electronic refill request, no recent/future appt., please advise  

## 2014-07-02 NOTE — Telephone Encounter (Signed)
Please schedule f/u in the winter and refill until then

## 2014-07-03 NOTE — Telephone Encounter (Signed)
appt scheduled and med refilled 

## 2014-07-29 ENCOUNTER — Other Ambulatory Visit: Payer: Self-pay | Admitting: Gastroenterology

## 2014-07-30 NOTE — Telephone Encounter (Signed)
Dr Deatra Ina Can patient have a refill of Xanax?

## 2014-07-31 ENCOUNTER — Other Ambulatory Visit: Payer: Self-pay | Admitting: Family Medicine

## 2014-07-31 ENCOUNTER — Other Ambulatory Visit: Payer: Self-pay | Admitting: Gastroenterology

## 2014-07-31 ENCOUNTER — Telehealth: Payer: Self-pay | Admitting: Gastroenterology

## 2014-07-31 NOTE — Telephone Encounter (Signed)
Cancel this request Dr Deatra Ina already approved this and I just faxed it back

## 2014-07-31 NOTE — Telephone Encounter (Signed)
Dr Deatra Ina can this patient have a refill of xanax

## 2014-08-05 ENCOUNTER — Ambulatory Visit: Payer: 59 | Admitting: Family Medicine

## 2014-08-24 ENCOUNTER — Other Ambulatory Visit: Payer: Self-pay | Admitting: Family Medicine

## 2014-08-27 ENCOUNTER — Other Ambulatory Visit: Payer: Self-pay | Admitting: Family Medicine

## 2014-08-27 NOTE — Telephone Encounter (Signed)
Pt cancelled her med refill appt., Rx declined and pharmacy advise pt needs f/u appt

## 2014-08-30 DIAGNOSIS — I82409 Acute embolism and thrombosis of unspecified deep veins of unspecified lower extremity: Secondary | ICD-10-CM

## 2014-08-30 HISTORY — DX: Acute embolism and thrombosis of unspecified deep veins of unspecified lower extremity: I82.409

## 2014-09-02 ENCOUNTER — Ambulatory Visit (INDEPENDENT_AMBULATORY_CARE_PROVIDER_SITE_OTHER): Payer: 59 | Admitting: Family Medicine

## 2014-09-02 ENCOUNTER — Ambulatory Visit: Payer: 59 | Admitting: Family Medicine

## 2014-09-02 ENCOUNTER — Encounter: Payer: Self-pay | Admitting: Family Medicine

## 2014-09-02 VITALS — BP 144/92 | HR 76 | Temp 97.8°F | Ht 61.75 in | Wt 179.5 lb

## 2014-09-02 DIAGNOSIS — I10 Essential (primary) hypertension: Secondary | ICD-10-CM

## 2014-09-02 DIAGNOSIS — G4733 Obstructive sleep apnea (adult) (pediatric): Secondary | ICD-10-CM | POA: Insufficient documentation

## 2014-09-02 DIAGNOSIS — G479 Sleep disorder, unspecified: Secondary | ICD-10-CM

## 2014-09-02 DIAGNOSIS — Z23 Encounter for immunization: Secondary | ICD-10-CM

## 2014-09-02 DIAGNOSIS — F329 Major depressive disorder, single episode, unspecified: Secondary | ICD-10-CM

## 2014-09-02 DIAGNOSIS — F32A Depression, unspecified: Secondary | ICD-10-CM

## 2014-09-02 MED ORDER — BUPROPION HCL ER (XL) 150 MG PO TB24: 150.0000 mg | ORAL_TABLET | Freq: Every day | ORAL | Status: DC

## 2014-09-02 MED ORDER — PANTOPRAZOLE SODIUM 40 MG PO TBEC: 40.0000 mg | DELAYED_RELEASE_TABLET | Freq: Every day | ORAL | Status: DC

## 2014-09-02 MED ORDER — TRAZODONE HCL 50 MG PO TABS: 50.0000 mg | ORAL_TABLET | Freq: Every day | ORAL | Status: DC

## 2014-09-02 NOTE — Progress Notes (Signed)
Subjective:    Patient ID: Carol Johnson, female    DOB: 1966-03-26, 49 y.o.   MRN: [id number]  HPI Here for refills of chronic medicines / rev of chronic med problems   Also sleep problems   Had EGD for gastritis  Thought to be due to otc sleep aides  Tried many - otc some had nsaid in them   She takes something otc now - equate pm something (has pain reliever in it)   Has never tried plain benadryl or sominex    The otc meds work for 4-6 hours  Usually goes to bed 10:30-11 pm  Usually gets up 6:30  Really feels better if she could get 8 hours of sleep   Lots of stress also  Just ended a relationship (2 years) - this makes her feel lost  Not a lot of support Would like to see a counselor   She still takes wellbutrin -but still cries a lot  Doing ok until the breakup   Does not currently use xanax - that was given by GI and it helped a lot   Patient Active Problem List   Diagnosis Date Noted  . Gastritis, chronic 06/06/2014  . Unspecified gastritis and gastroduodenitis without mention of hemorrhage 05/03/2014  . Abdominal pain, epigastric 04/19/2014  . Internal hemorrhoids with other complication 03/47/4259  . Obese 06/27/2013  . Routine general medical examination at a health care facility 10/14/2011  . Thyroid nodule 05/16/2011  . Rectal bleeding 11/24/2010  . Hemorrhoids, internal 11/24/2010  . Irritable bowel syndrome (IBS) 11/24/2010  . ANEMIA 10/02/2009  . HYPERLIPIDEMIA 07/18/2009  . HYPERTENSION 07/18/2009  . DYSPNEA ON EXERTION 07/18/2009  . THYROID CYST 01/31/2008  . ANXIETY 01/31/2008  . DEPRESSION 01/31/2008  . GASTRIC ULCER, HX OF 01/31/2008  . FIBROIDS, UTERUS 11/13/2007  . ALLERGIC RHINITIS 11/13/2007  . ASTHMA 11/13/2007  . GERD 11/13/2007  . IRRITABLE BOWEL SYNDROME 11/13/2007  . INTERNAL HEMORRHOIDS 11/12/2003   Past Medical History  Diagnosis Date  . Internal hemorrhoid   . GERD (gastroesophageal reflux disease)   . Irritable bowel  syndrome   . Fibroid uterus   . Allergic rhinitis   . Hypercholesterolemia   . Asthma   . URI (upper respiratory infection)   . Anxiety   . Depression   . Abdominal pain, unspecified site   . Pneumonia 1998  . Uterine fibroid    Past Surgical History  Procedure Laterality Date  . Myomectomy    . Uterine fibroid surgery    . Cholecystectomy      laparoscopic "gallstones"  . Cesarean section  2004  . Esophagogastroduodenoscopy (egd) with propofol N/A 05/03/2014    Procedure: ESOPHAGOGASTRODUODENOSCOPY (EGD) WITH PROPOFOL;  Surgeon: Inda Castle, MD;  Location: [facility];  Service: Endoscopy;  Laterality: N/A;   History  Substance Use Topics  . Smoking status: Former Research scientist (life sciences)  . Smokeless tobacco: Never Used     Comment: as a teenager-none recent  . Alcohol Use: 0.6 oz/week    1 Not specified per week     Comment: occ   Family History  Problem Relation Age of Onset  . Colon cancer Paternal Aunt   . Uterine cancer Paternal Grandmother   . Heart disease Maternal Grandmother   . Heart disease Paternal Uncle   . Heart disease Paternal Aunt   . Kidney disease Cousin    Allergies  Allergen Reactions  . Sertraline Hcl     REACTION: diarrhea  . Zoloft [  Sertraline Hcl] Diarrhea   Current Outpatient Prescriptions on File Prior to Visit  Medication Sig Dispense Refill  . ALPRAZolam (XANAX) 1 MG tablet TAKE 1 TABLET BY MOUTH AT BEDTIME AS NEEDED 15 tablet 0  . atorvastatin (LIPITOR) 10 MG tablet Take 10 mg by mouth daily.    Marland Kitchen buPROPion (WELLBUTRIN XL) 150 MG 24 hr tablet TAKE 1 TABLET EVERY DAY 30 tablet 2  . Norethin Ace-Eth Estrad-FE (LOESTRIN 24 FE PO) Take 1 tablet by mouth daily.     . pantoprazole (PROTONIX) 40 MG tablet TAKE 1 TABLET EVERY DAY 30 tablet 5   No current facility-administered medications on file prior to visit.       Review of Systems     Objective:   Physical Exam        Assessment & Plan:

## 2014-09-02 NOTE — Assessment & Plan Note (Signed)
bp is up today- pt is stressed  Will re check at health mt visit Disc healthy diet and exercise

## 2014-09-02 NOTE — Assessment & Plan Note (Signed)
Suspect rel to mood disorder and stressors  Will stop otc sleep aids with nsaid that may have hurt her stomach  Trial of trazadone 50 mg (can cut to 25 if too sedating)  Disc poss side eff incl worse depression- if problems or no imp may consider lunesta instead  Disc sleep hygiene and cessation of caffeine

## 2014-09-02 NOTE — Progress Notes (Signed)
Pre visit review using our clinic review tool, if applicable. No additional management support is needed unless otherwise documented below in the visit note. 

## 2014-09-02 NOTE — Patient Instructions (Signed)
Continue current medicines  Take trazadone 50 mg each night for sleep (also helps with depression)- if that is too sedating try cutting it in 1/2  If side effects or not helpful- call and let me know and stop the medicine  Stop at check out so we can work on a counseling referral  Schedule for physical with labs prior -- late spring

## 2014-09-02 NOTE — Assessment & Plan Note (Addendum)
Worse after stressor end of relationship Adv to continue wellbutrin Added trazadone for sleep Ref for counseling  Reviewed stressors/ coping techniques/symptoms/ support sources/ tx options and side effects in detail today  >25 minutes spent in face to face time with patient, >50% spent in counselling or coordination of care

## 2014-09-15 ENCOUNTER — Emergency Department (HOSPITAL_COMMUNITY)
Admission: EM | Admit: 2014-09-15 | Discharge: 2014-09-15 | Disposition: A | Discharge status: Home / Self Care | Payer: 59 | Attending: Emergency Medicine | Admitting: Emergency Medicine

## 2014-09-15 ENCOUNTER — Encounter (HOSPITAL_COMMUNITY): Payer: Self-pay | Admitting: Emergency Medicine

## 2014-09-15 DIAGNOSIS — Z79899 Other long term (current) drug therapy: Secondary | ICD-10-CM | POA: Diagnosis not present

## 2014-09-15 DIAGNOSIS — Z8701 Personal history of pneumonia (recurrent): Secondary | ICD-10-CM | POA: Diagnosis not present

## 2014-09-15 DIAGNOSIS — J45909 Unspecified asthma, uncomplicated: Secondary | ICD-10-CM | POA: Diagnosis not present

## 2014-09-15 DIAGNOSIS — Z8639 Personal history of other endocrine, nutritional and metabolic disease: Secondary | ICD-10-CM | POA: Insufficient documentation

## 2014-09-15 DIAGNOSIS — Z8742 Personal history of other diseases of the female genital tract: Secondary | ICD-10-CM | POA: Diagnosis not present

## 2014-09-15 DIAGNOSIS — F419 Anxiety disorder, unspecified: Secondary | ICD-10-CM | POA: Diagnosis not present

## 2014-09-15 DIAGNOSIS — F329 Major depressive disorder, single episode, unspecified: Secondary | ICD-10-CM | POA: Insufficient documentation

## 2014-09-15 DIAGNOSIS — I82402 Acute embolism and thrombosis of unspecified deep veins of left lower extremity: Secondary | ICD-10-CM | POA: Insufficient documentation

## 2014-09-15 DIAGNOSIS — K219 Gastro-esophageal reflux disease without esophagitis: Secondary | ICD-10-CM | POA: Insufficient documentation

## 2014-09-15 DIAGNOSIS — Z87891 Personal history of nicotine dependence: Secondary | ICD-10-CM | POA: Diagnosis not present

## 2014-09-15 DIAGNOSIS — M79605 Pain in left leg: Secondary | ICD-10-CM | POA: Diagnosis present

## 2014-09-15 MED ORDER — XARELTO VTE STARTER PACK 15 & 20 MG PO TBPK: 15.0000 mg | ORAL_TABLET | ORAL | Status: DC

## 2014-09-15 MED ORDER — RIVAROXABAN 15 MG PO TABS
15.0000 mg | ORAL_TABLET | Freq: Once | ORAL | Status: AC
  Administered 2014-09-15: 15 mg via ORAL
  Filled 2014-09-15: qty 1

## 2014-09-15 MED ORDER — HYDROCODONE-ACETAMINOPHEN 5-325 MG PO TABS
2.0000 | ORAL_TABLET | Freq: Once | ORAL | Status: AC
  Administered 2014-09-15: 2 via ORAL
  Filled 2014-09-15: qty 2

## 2014-09-15 MED ORDER — HYDROCODONE-ACETAMINOPHEN 5-325 MG PO TABS
1.0000 | ORAL_TABLET | Freq: Four times a day (QID) | ORAL | Status: DC | PRN
Start: 2014-09-15 — End: 2014-09-17

## 2014-09-15 NOTE — Progress Notes (Signed)
VASCULAR LAB PRELIMINARY  PRELIMINARY  PRELIMINARY  PRELIMINARY  Left lower extremity venous Doppler completed.    Preliminary report:  There is acute DVT noted in the left posterior tibial vein from ankle to mid calf.  Phenix Grein, RVT 09/15/2014, 4:08 PM

## 2014-09-15 NOTE — Discharge Instructions (Signed)
Deep Vein Thrombosis A deep vein thrombosis (DVT) is a blood clot that develops in the deep, larger veins of the leg, arm, or pelvis. These are more dangerous than clots that might form in veins near the surface of the body. A DVT can lead to serious and even life-threatening complications if the clot breaks off and travels in the bloodstream to the lungs.  A DVT can damage the valves in your leg veins so that instead of flowing upward, the blood pools in the lower leg. This is called post-thrombotic syndrome, and it can result in pain, swelling, discoloration, and sores on the leg. CAUSES Usually, several things contribute to the formation of blood clots. Contributing factors include:  The flow of blood slows down.  The inside of the vein is damaged in some way.  You have a condition that makes blood clot more easily. RISK FACTORS Some people are more likely than others to develop blood clots. Risk factors include:   Smoking.  Being overweight (obese).  Sitting or lying still for a long time. This includes long-distance travel, paralysis, or recovery from an illness or surgery. Other factors that increase risk are:   Older age, especially over 24 years of age.  Having a family history of blood clots or if you have already had a blot clot.  Having major or lengthy surgery. This is especially true for surgery on the hip, knee, or belly (abdomen). Hip surgery is particularly high risk.  Having a long, thin tube (catheter) placed inside a vein during a medical procedure.  Breaking a hip or leg.  Having cancer or cancer treatment.  Pregnancy and childbirth.  Hormone changes make the blood clot more easily during pregnancy.  The fetus puts pressure on the veins of the pelvis.  There is a risk of injury to veins during delivery or a caesarean delivery. The risk is highest just after childbirth.  Medicines containing the female hormone estrogen. This includes birth control pills and  hormone replacement therapy.  Other circulation or heart problems.  SIGNS AND SYMPTOMS When a clot forms, it can either partially or totally block the blood flow in that vein. Symptoms of a DVT can include:  Swelling of the leg or arm, especially if one side is much worse.  Warmth and redness of the leg or arm, especially if one side is much worse.  Pain in an arm or leg. If the clot is in the leg, symptoms may be more noticeable or worse when standing or walking. The symptoms of a DVT that has traveled to the lungs (pulmonary embolism, PE) usually start suddenly and include:  Shortness of breath.  Coughing.  Coughing up blood or blood-tinged mucus.  Chest pain. The chest pain is often worse with deep breaths.  Rapid heartbeat. Anyone with these symptoms should get emergency medical treatment right away. Do not wait to see if the symptoms will go away. Call your local emergency services (911 in the [place] if you have these symptoms. Do not drive yourself to the hospital. DIAGNOSIS If a DVT is suspected, your health care provider will take a full medical history and perform a physical exam. Tests that also may be required include:  Blood tests, including studies of the clotting properties of the blood.  Ultrasound to see if you have clots in your legs or lungs.  X-rays to show the flow of blood when dye is injected into the veins (venogram).  Studies of your lungs if you have any  chest symptoms. PREVENTION  Exercise the legs regularly. Take a brisk 30-minute walk every day.  Maintain a weight that is appropriate for your height.  Avoid sitting or lying in bed for long periods of time without moving your legs.  Women, particularly those over the age of 14 years, should consider the risks and benefits of taking estrogen medicines, including birth control pills.  Do not smoke, especially if you take estrogen medicines.  Long-distance travel can increase your risk of DVT. You  should exercise your legs by walking or pumping the muscles every hour.  Many of the risk factors above relate to situations that exist with hospitalization, either for illness, injury, or elective surgery. Prevention may include medical and nonmedical measures.  Your health care provider will assess you for the need for venous thromboembolism prevention when you are admitted to the hospital. If you are having surgery, your surgeon will assess you the day of or day after surgery. TREATMENT Once identified, a DVT can be treated. It can also be prevented in some circumstances. Once you have had a DVT, you may be at increased risk for a DVT in the future. The most common treatment for DVT is blood-thinning (anticoagulant) medicine, which reduces the blood's tendency to clot. Anticoagulants can stop new blood clots from forming and stop old clots from growing. They cannot dissolve existing clots. Your body does this by itself over time. Anticoagulants can be given by mouth, through an IV tube, or by injection. Your health care provider will determine the best program for you. Other medicines or treatments that may be used are:  Heparin or related medicines (low molecular weight heparin) are often the first treatment for a blood clot. They act quickly. However, they cannot be taken orally and must be given either in shot form or by IV tube.  Heparin can cause a fall in a component of blood that stops bleeding and forms blood clots (platelets). You will be monitored with blood tests to be sure this does not occur.  Warfarin is an anticoagulant that can be swallowed. It takes a few days to start working, so usually heparin or related medicines are used in combination. Once warfarin is working, heparin is usually stopped.  Factor Xa inhibitor medicines, such as rivaroxaban and apixaban, also reduce blood clotting. These medicines are taken orally and can often be used without heparin or related  medicines.  Less commonly, clot dissolving drugs (thrombolytics) are used to dissolve a DVT. They carry a high risk of bleeding, so they are used mainly in severe cases where your life or a part of your body is threatened.  Very rarely, a blood clot in the leg needs to be removed surgically.  If you are unable to take anticoagulants, your health care provider may arrange for you to have a filter placed in a main vein in your abdomen. This filter prevents clots from traveling to your lungs. HOME CARE INSTRUCTIONS  Take all medicines as directed by your health care provider.  Learn as much as you can about DVT.  Wear a medical alert bracelet or carry a medical alert card.  Ask your health care provider how soon you can go back to normal activities. It is important to stay active to prevent blood clots. If you are on anticoagulant medicine, avoid contact sports.  It is very important to exercise. This is especially important while traveling, sitting, or standing for long periods of time. Exercise your legs by walking or by  tightening and relaxing your leg muscles regularly. Take frequent walks.  You may need to wear compression stockings. These are tight elastic stockings that apply pressure to the lower legs. This pressure can help keep the blood in the legs from clotting. Taking Warfarin Warfarin is a daily medicine that is taken by mouth. Your health care provider will advise you on the length of treatment (usually 3-6 months, sometimes lifelong). If you take warfarin:  Understand how to take warfarin and foods that can affect how warfarin works in Veterinary surgeon.  Too much and too little warfarin are both dangerous. Too much warfarin increases the risk of bleeding. Too little warfarin continues to allow the risk for blood clots. Warfarin and Regular Blood Testing While taking warfarin, you will need to have regular blood tests to measure your blood clotting time. These blood tests usually  include both the prothrombin time (PT) and international normalized ratio (INR) tests. The PT and INR results allow your health care provider to adjust your dose of warfarin. It is very important that you have your PT and INR tested as often as directed by your health care provider.  Warfarin and Your Diet Avoid major changes in your diet, or notify your health care provider before changing your diet. Arrange a visit with a registered dietitian to answer your questions. Many foods, especially foods high in vitamin K, can interfere with warfarin and affect the PT and INR results. You should eat a consistent amount of foods high in vitamin K. Foods high in vitamin K include:   Spinach, kale, broccoli, cabbage, collard and turnip greens, Brussels sprouts, peas, cauliflower, seaweed, and parsley.  Beef and pork liver.  Green tea.  Soybean oil. Warfarin with Other Medicines Many medicines can interfere with warfarin and affect the PT and INR results. You must:  Tell your health care provider about any and all medicines, vitamins, and supplements you take, including aspirin and other over-the-counter anti-inflammatory medicines. Be especially cautious with aspirin and anti-inflammatory medicines. Ask your health care provider before taking these.  Do not take or discontinue any prescribed or over-the-counter medicine except on the advice of your health care provider or pharmacist. Warfarin Side Effects Warfarin can have side effects, such as easy bruising and difficulty stopping bleeding. Ask your health care provider or pharmacist about other side effects of warfarin. You will need to:  Hold pressure over cuts for longer than usual.  Notify your dentist and other health care providers that you are taking warfarin before you undergo any procedures where bleeding may occur. Warfarin with Alcohol and Tobacco   Drinking alcohol frequently can increase the effect of warfarin, leading to excess  bleeding. It is best to avoid alcoholic drinks or to consume only very small amounts while taking warfarin. Notify your health care provider if you change your alcohol intake.   Do not use any tobacco products including cigarettes, chewing tobacco, or electronic cigarettes. If you smoke, quit. Ask your health care provider for help with quitting smoking. Alternative Medicines to Warfarin: Factor Xa Inhibitor Medicines  These blood-thinning medicines are taken by mouth, usually for several weeks or longer. It is important to take the medicine every single day at the same time each day.  There are no regular blood tests required when using these medicines.  There are fewer food and drug interactions than with warfarin.  The side effects of this class of medicine are similar to those of warfarin, including excessive bruising or bleeding. Ask your  health care provider or pharmacist about other potential side effects. SEEK MEDICAL CARE IF:  You notice a rapid heartbeat.  You feel weaker or more tired than usual.  You feel faint.  You notice increased bruising.  You feel your symptoms are not getting better in the time expected.  You believe you are having side effects of medicine. SEEK IMMEDIATE MEDICAL CARE IF:  You have chest pain.  You have trouble breathing.  You have new or increased swelling or pain in one leg.  You cough up blood.  You notice blood in vomit, in a bowel movement, or in urine. MAKE SURE YOU:  Understand these instructions.  Will watch your condition.  Will get help right away if you are not doing well or get worse. Document Released: 08/16/2005 Document Revised: 12/31/2013 Document Reviewed: 04/23/2013 [facility] Patient Information 2015 [place], [place]. This information is not intended to replace advice given to you by your health care provider. Make sure you discuss any questions you have with your health care provider.   Smoking Cessation Quitting  smoking is important to your health and has many advantages. However, it is not always easy to quit since nicotine is a very addictive drug. Oftentimes, people try 3 times or more before being able to quit. This document explains the best ways for you to prepare to quit smoking. Quitting takes hard work and a lot of effort, but you can do it. ADVANTAGES OF QUITTING SMOKING  You will live longer, feel better, and live better.  Your body will feel the impact of quitting smoking almost immediately.  Within 20 minutes, blood pressure decreases. Your pulse returns to its normal level.  After 8 hours, carbon monoxide levels in the blood return to normal. Your oxygen level increases.  After 24 hours, the chance of having a heart attack starts to decrease. Your breath, hair, and body stop smelling like smoke.  After 48 hours, damaged nerve endings begin to recover. Your sense of taste and smell improve.  After 72 hours, the body is virtually free of nicotine. Your bronchial tubes relax and breathing becomes easier.  After 2 to 12 weeks, lungs can hold more air. Exercise becomes easier and circulation improves.  The risk of having a heart attack, stroke, cancer, or lung disease is greatly reduced.  After 1 year, the risk of coronary heart disease is cut in half.  After 5 years, the risk of stroke falls to the same as a nonsmoker.  After 10 years, the risk of lung cancer is cut in half and the risk of other cancers decreases significantly.  After 15 years, the risk of coronary heart disease drops, usually to the level of a nonsmoker.  If you are pregnant, quitting smoking will improve your chances of having a healthy baby.  The people you live with, especially any children, will be healthier.  You will have extra money to spend on things other than cigarettes. QUESTIONS TO THINK ABOUT BEFORE ATTEMPTING TO QUIT You may want to talk about your answers with your health care provider.  Why do  you want to quit?  If you tried to quit in the past, what helped and what did not?  What will be the most difficult situations for you after you quit? How will you plan to handle them?  Who can help you through the tough times? Your family? Friends? A health care provider?  What pleasures do you get from smoking? What ways can you still get pleasure  if you quit? Here are some questions to ask your health care provider:  How can you help me to be successful at quitting?  What medicine do you think would be best for me and how should I take it?  What should I do if I need more help?  What is smoking withdrawal like? How can I get information on withdrawal? GET READY  Set a quit date.  Change your environment by getting rid of all cigarettes, ashtrays, matches, and lighters in your home, car, or work. Do not let people smoke in your home.  Review your past attempts to quit. Think about what worked and what did not. GET SUPPORT AND ENCOURAGEMENT You have a better chance of being successful if you have help. You can get support in many ways.  Tell your family, friends, and coworkers that you are going to quit and need their support. Ask them not to smoke around you.  Get individual, group, or telephone counseling and support. Programs are available at [employer] and health centers. Call your local health department for information about programs in your area.  Spiritual beliefs and practices may help some smokers quit.  Download a "quit meter" on your computer to keep track of quit statistics, such as how long you have gone without smoking, cigarettes not smoked, and money saved.  Get a self-help book about quitting smoking and staying off tobacco. [place] yourself from urges to smoke. Talk to someone, go for a walk, or occupy your time with a task.  Change your normal routine. Take a different route to work. Drink tea instead of coffee. Eat  breakfast in a different place.  Reduce your stress. Take a hot bath, exercise, or read a book.  Plan something enjoyable to do every day. Reward yourself for not smoking.  Explore interactive web-based programs that specialize in helping you quit. GET MEDICINE AND USE IT CORRECTLY Medicines can help you stop smoking and decrease the urge to smoke. Combining medicine with the above behavioral methods and support can greatly increase your chances of successfully quitting smoking.  Nicotine replacement therapy helps deliver nicotine to your body without the negative effects and risks of smoking. Nicotine replacement therapy includes nicotine gum, lozenges, inhalers, nasal sprays, and skin patches. Some may be available over-the-counter and others require a prescription.  Antidepressant medicine helps people abstain from smoking, but how this works is unknown. This medicine is available by prescription.  Nicotinic receptor partial agonist medicine simulates the effect of nicotine in your brain. This medicine is available by prescription. Ask your health care provider for advice about which medicines to use and how to use them based on your health history. Your health care provider will tell you what side effects to look out for if you choose to be on a medicine or therapy. Carefully read the information on the package. Do not use any other product containing nicotine while using a nicotine replacement product.  RELAPSE OR DIFFICULT SITUATIONS Most relapses occur within the first 3 months after quitting. Do not be discouraged if you start smoking again. Remember, most people try several times before finally quitting. You may have symptoms of withdrawal because your body is used to nicotine. You may crave cigarettes, be irritable, feel very hungry, cough often, get headaches, or have difficulty concentrating. The withdrawal symptoms are only temporary. They are strongest when you first quit, but they  will go away within 10-14 days. To reduce the  chances of relapse, try to:  Avoid drinking alcohol. Drinking lowers your chances of successfully quitting.  Reduce the amount of caffeine you consume. Once you quit smoking, the amount of caffeine in your body increases and can give you symptoms, such as a rapid heartbeat, sweating, and anxiety.  Avoid smokers because they can make you want to smoke.  Do not let weight gain distract you. Many smokers will gain weight when they quit, usually less than 10 pounds. Eat a healthy diet and stay active. You can always lose the weight gained after you quit.  Find ways to improve your mood other than smoking. FOR MORE INFORMATION  www.smokefree.gov  Document Released: 08/10/2001 Document Revised: 12/31/2013 Document Reviewed: 11/25/2011 [facility] Patient Information 2015 [place], [place]. This information is not intended to replace advice given to you by your health care provider. Make sure you discuss any questions you have with your health care provider.

## 2014-09-15 NOTE — ED Notes (Signed)
Pt c/o left calf pain and tightness that has gotten worse over the past week.  Pt states that about week ago she had left hip pain that moved dow her left leg.

## 2014-09-15 NOTE — ED Provider Notes (Addendum)
TIME SEEN: 2:05 PM  CHIEF COMPLAINT: Left lower extremity pain  HPI: Pt is a 49 y.o. female with history of tobacco use who is on Loestrin who presents to the emergency department with left calf pain and tightness that started over the past week. She states initially she had left hip pain but now it is gone and she is not having pain in her left calf. No history of PE or DVT. No chest pain or shortness of breath. No injury to the leg. No numbness, tingling or focal weakness.  ROS: See HPI Constitutional: no fever  Eyes: no drainage  ENT: no runny nose   Cardiovascular:  no chest pain  Resp: no SOB  GI: no vomiting GU: no dysuria Integumentary: no rash  Allergy: no hives  Musculoskeletal: no leg swelling  Neurological: no slurred speech ROS otherwise negative  PAST MEDICAL HISTORY/PAST SURGICAL HISTORY:  Past Medical History  Diagnosis Date  . Internal hemorrhoid   . GERD (gastroesophageal reflux disease)   . Irritable bowel syndrome   . Fibroid uterus   . Allergic rhinitis   . Hypercholesterolemia   . Asthma   . URI (upper respiratory infection)   . Anxiety   . Depression   . Abdominal pain, unspecified site   . Pneumonia 1998  . Uterine fibroid     MEDICATIONS:  Prior to Admission medications   Medication Sig Start Date End Date Taking? Authorizing Provider  buPROPion (WELLBUTRIN XL) 150 MG 24 hr tablet Take 1 tablet (150 mg total) by mouth daily. 09/02/14  Yes Abner Greenspan, MD  Ibuprofen-Diphenhydramine Cit (IBUPROFEN PM PO) Take 2 tablets by mouth at bedtime as needed (for pain/sleep).   Yes Historical Provider, MD  Norethin Ace-Eth Estrad-FE (LOESTRIN 24 FE PO) Take 1 tablet by mouth daily.    Yes Historical Provider, MD  pantoprazole (PROTONIX) 40 MG tablet Take 1 tablet (40 mg total) by mouth daily. 09/02/14  Yes Abner Greenspan, MD  traZODone (DESYREL) 50 MG tablet Take 1 tablet (50 mg total) by mouth at bedtime. 09/02/14  Yes Abner Greenspan, MD  ALPRAZolam Duanne Moron) 1 MG  tablet TAKE 1 TABLET BY MOUTH AT BEDTIME AS NEEDED Patient not taking: Reported on 09/15/2014 07/31/14   Inda Castle, MD    ALLERGIES:  Allergies  Allergen Reactions  . Sertraline Hcl     REACTION: diarrhea  . Zoloft [Sertraline Hcl] Diarrhea    SOCIAL HISTORY:  History  Substance Use Topics  . Smoking status: Former Research scientist (life sciences)  . Smokeless tobacco: Never Used     Comment: as a teenager-none recent  . Alcohol Use: 0.6 oz/week    1 Not specified per week     Comment: occ    FAMILY HISTORY: Family History  Problem Relation Age of Onset  . Colon cancer Paternal Aunt   . Uterine cancer Paternal Grandmother   . Heart disease Maternal Grandmother   . Heart disease Paternal Uncle   . Heart disease Paternal Aunt   . Kidney disease Cousin     EXAM: BP 186/91 mmHg  Pulse 86  Temp(Src) 98 F (36.7 C) (Oral)  Resp 18  SpO2 100%  LMP 08/26/2014 CONSTITUTIONAL: Alert and oriented and responds appropriately to questions. Well-appearing; well-nourished HEAD: Normocephalic EYES: Conjunctivae clear, PERRL ENT: normal nose; no rhinorrhea; moist mucous membranes; pharynx without lesions noted NECK: Supple, no meningismus, no LAD  CARD: RRR; S1 and S2 appreciated; no murmurs, no clicks, no rubs, no gallops RESP: Normal chest  excursion without splinting or tachypnea; breath sounds clear and equal bilaterally; no wheezes, no rhonchi, no rales,  ABD/GI: Normal bowel sounds; non-distended; soft, non-tender, no rebound, no guarding BACK:  The back appears normal and is non-tender to palpation, there is no CVA tenderness EXT: Tender to palpation over the left posterior calf without obvious swelling, no ecchymosis or deformity, no bony tenderness, no joint effusions, no erythema or warmth, no fluctuance or induration, no tenderness over the left hip, no leg length discrepancy, 2+ DP pulses bilaterally, sensation to light touch intact diffusely, Normal ROM in all joints; otherwise extremities are  non-tender to palpation; no edema; normal capillary refill; no cyanosis    SKIN: Normal color for age and race; warm NEURO: Moves all extremities equally PSYCH: The patient's mood and manner are appropriate. Grooming and personal hygiene are appropriate.  MEDICAL DECISION MAKING: Patient here with left calf tenderness. She is on exogenous estrogen and is a smoker. Concern for possible DVT. No signs of cellulitis on exam. No history of injury to suggest dislocation or fracture. Neurovascular intact distally. We'll give Vicodin and obtain venous Doppler.  ED PROGRESS: Patient is positive for DVT. Discussed with patient at length that she either needs to stop smoking or stop using exogenous estrogens as this is likely what her at risk for the DVT. She verbalized understanding. Again she has no chest pain or shortness of breath. She has good palpable pulses in her lower extremity and is able to ambulate. We'll discharge home on Xarelto. Patient has always had a normal creatinine clearance. Have advised her to follow-up with her primary care physician. Discussed return precautions. She verbalizes understanding and is comfortable with plan.     [place], DO 09/15/14 [place], DO 09/15/14 1606

## 2014-09-16 ENCOUNTER — Telehealth: Payer: Self-pay

## 2014-09-16 NOTE — Telephone Encounter (Signed)
Pt has f/u ED appt with Dr Glori Bickers 09/17/14 at 3:15pm.

## 2014-09-16 NOTE — Telephone Encounter (Signed)
PLEASE NOTE: All timestamps contained within this report are represented as [place] Standard Time. CONFIDENTIALTY NOTICE: This fax transmission is intended only for the addressee. It contains information that is legally privileged, confidential or otherwise protected from use or disclosure. If you are not the intended recipient, you are strictly prohibited from reviewing, disclosing, copying using or disseminating any of this information or taking any action in reliance on or regarding this information. If you have received this fax in error, please notify us immediately by telephone so that we can arrange for its return to [place]. Phone: [phone], Toll-Free: [phone], Fax: [phone] Page: 1 of 2 Call Id: [id number] [place] Patient Name: Carol Johnson Gender: Female DOB: December 02, 1965 Age: 49 Y 11 M 9 D Return Phone Number: [phone] (Primary) Address: [place]/State/Zip: [place] [address] Client [place] [facility] - Client Client Site [facility] [place] Contact Type Call Call Type Triage / Clinical Relationship To Patient Self Return Phone Number [phone] (Primary) Chief Complaint Leg Pain Initial Comment Caller states, pain in her calf for a week, throbbing. It started in her hip then moved down to calf. Leg is swollen. PreDisposition Did not know what to do Nurse Assessment Nurse: Loletta Specter, RN, Wells Guiles Date/Time [name] Time): 09/15/2014 8:50:19 AM Confirm and document reason for call. If symptomatic, describe symptoms. ---Caller states, pain in her calf for a week, throbbing. It started in her hip then moved down to calf. Leg is swollen. Has the patient traveled out of the country within the last 30 days? ---Not Applicable Does the patient require triage? ---Yes Related visit to  physician within the last 2 weeks? ---No Does the PT have any chronic conditions? (i.e. diabetes, asthma, etc.) ---Yes List chronic conditions. ---hyperlipidemia. Guidelines Guideline Title Affirmed Question Affirmed Notes Nurse Date/Time [name] Time) Leg Pain [1] Thigh or calf pain AND [2] only 1 side AND [3] present > 1 hour Loletta Specter, RN, Wells Guiles 09/15/2014 8:50:55 AM Disp. Time [name] Time) Disposition Final User 09/15/2014 8:52:33 AM See Physician within 4 Hours (or PCP triage) Yes Loletta Specter, RN, Romualdo Bolk Understands: Yes Disagree/Comply: Comply PLEASE NOTE: All timestamps contained within this report are represented as [place] Standard Time. CONFIDENTIALTY NOTICE: This fax transmission is intended only for the addressee. It contains information that is legally privileged, confidential or otherwise protected from use or disclosure. If you are not the intended recipient, you are strictly prohibited from reviewing, disclosing, copying using or disseminating any of this information or taking any action in reliance on or regarding this information. If you have received this fax in error, please notify us immediately by telephone so that we can arrange for its return to [place]. Phone: [phone], Toll-Free: [phone], Fax: [phone] Page: 2 of 2 Call Id: [id number] Care Advice Given Per Guideline SEE PHYSICIAN WITHIN 4 HOURS (or PCP triage): * IF NO PCP TRIAGE: You need to be seen. Go to _______________ (ED/ [facility] or office if it will be open) within the next 3 or 4 hours. Go sooner if you become worse. CARE ADVICE given per Leg Pain (Adult) guideline. After Care Instructions Given Call Event Type User Date / Time Description Referrals GO TO FACILITY UNDECIDED

## 2014-09-17 ENCOUNTER — Encounter: Payer: Self-pay | Admitting: Family Medicine

## 2014-09-17 ENCOUNTER — Ambulatory Visit (INDEPENDENT_AMBULATORY_CARE_PROVIDER_SITE_OTHER): Payer: 59 | Admitting: Family Medicine

## 2014-09-17 VITALS — BP 156/88 | HR 96 | Temp 98.0°F | Ht 61.75 in | Wt 181.8 lb

## 2014-09-17 DIAGNOSIS — I82402 Acute embolism and thrombosis of unspecified deep veins of left lower extremity: Secondary | ICD-10-CM

## 2014-09-17 DIAGNOSIS — Z86718 Personal history of other venous thrombosis and embolism: Secondary | ICD-10-CM | POA: Insufficient documentation

## 2014-09-17 DIAGNOSIS — J029 Acute pharyngitis, unspecified: Secondary | ICD-10-CM | POA: Insufficient documentation

## 2014-09-17 LAB — POCT RAPID STREP A (OFFICE): Rapid Strep A Screen: NEGATIVE

## 2014-09-17 MED ORDER — HYDROCODONE-ACETAMINOPHEN 5-325 MG PO TABS: 2.0000 | ORAL_TABLET | Freq: Four times a day (QID) | ORAL | Status: DC | PRN

## 2014-09-17 NOTE — Progress Notes (Signed)
Subjective:    Patient ID: Carol Johnson, female    DOB: [birth year], 49 y.o.   MRN: [id number]  HPI Here for f/u of ED visit on 1/17  - for L calf pain that turned out to be a DVT Venous doppler showed acute DVT of post tibial vein  She was d/c home on  xarelto starter pack  No side effects    It hurts  Cannot tell if it is swollen  Can feel a knot in that area   No family hx of blood clots that she knows of  No personal hx of blood clots  No sob or trouble breathing at all   OC loestrin fe Was smoking -- does not smoke normally (once in a while when drinking)  She did stop her OC  Has horrible periods off OC -she sees Dr Edwyna Ready  Will need to have an ablation   Insurance is not covering her xarelto so far   Also has a ST today No other symptoms  No cold symptoms or nasal drainage No fever Neg RST today   Review of Systems Review of Systems  Constitutional: Negative for fever, appetite change, fatigue and unexpected weight change.  Eyes: Negative for pain and visual disturbance.  ENT pos for ST , neg for nasal symptoms  Respiratory: Negative for cough and shortness of breath.   Cardiovascular: Negative for cp or palpitations    Gastrointestinal: Negative for nausea, diarrhea and constipation.  Genitourinary: Negative for urgency and frequency.  Skin: Negative for pallor or rash   MSK pos for L calf pain  Neurological: Negative for weakness, light-headedness, numbness and headaches.  Hematological: Negative for adenopathy. Does not bruise/bleed easily.  Psychiatric/Behavioral: Negative for dysphoric mood. The patient is not nervous/anxious.         Objective:   Physical Exam  Constitutional: She appears well-developed and well-nourished. No distress.  obese and well appearing   HENT:  Head: Normocephalic and atraumatic.  Right Ear: External ear normal.  Left Ear: External ear normal.  Boggy nares Throat mildly injected  Debris around L tonsil  Eyes:  Conjunctivae and EOM are normal. Pupils are equal, round, and reactive to light. Right eye exhibits no discharge. Left eye exhibits no discharge.  Neck: Normal range of motion. Neck supple. No JVD present.  Cardiovascular: Regular rhythm and normal heart sounds.   Pulmonary/Chest: Effort normal and breath sounds normal. No respiratory distress. She has no wheezes. She has no rales.  Abdominal: Soft. Bowel sounds are normal.  Musculoskeletal: She exhibits tenderness. She exhibits no edema.  Diffusely tender in L calf  No swelling or erythema slt warmer than R calf Nl rom and gait   Lymphadenopathy:    She has no cervical adenopathy.  Neurological: She is alert. She has normal reflexes. No cranial nerve deficit. She exhibits normal muscle tone. Coordination normal.  Skin: Skin is warm and dry. No rash noted. No erythema.  Psychiatric: She has a normal mood and affect.          Assessment & Plan:   Problem List Items Addressed This Visit      Cardiovascular and Mediastinum   Left leg DVT    Reviewed hosp records in detail - pt was on OC and casually smoking  May not be able to afford xarelto - will call ins and get opt that are affordable (will call with that)- may need to change to warfarin  Still fair amt of pain - refilled  norco for prn cautious use  Will watch for sob or chest pain  Pt did stop smoking -adv to quit for good Also stopped OC - she will f/u with her gyn re: non hormonal alt to tx her period issues  Will likely continue tx for 3-6 mo         Other   Sore throat - Primary    Neg RST cx taken  Some debris around L tonsil  Poss early uri or viral  Disc symptomatic care - see instructions on AVS  Pend cx  Will update if worse or fever in the meantime       Relevant Orders   POCT rapid strep A (Completed)   Throat culture Randell Loop)

## 2014-09-17 NOTE — Patient Instructions (Signed)
Get in touch with Dr Edwyna Ready about periods and stay off the oral contraceptive Do not smoke  Continue xarelto and contact your insurance co to find out what alternative treatment for DVT is affordable for you and let me know  Keep leg elevated when sitting and try a warm compress  Drink lots of fluids/ gargle with salt water , try chloraseptic throat spray- and we will update when your throat culture returns    Call if fever or symptoms worsen   Follow up in 3 months

## 2014-09-17 NOTE — Progress Notes (Signed)
Pre visit review using our clinic review tool, if applicable. No additional management support is needed unless otherwise documented below in the visit note. 

## 2014-09-18 ENCOUNTER — Telehealth: Payer: Self-pay | Admitting: Family Medicine

## 2014-09-18 NOTE — Telephone Encounter (Signed)
Patient is asking to be called with throat culture results.  She's in a lot of pain.

## 2014-09-18 NOTE — Telephone Encounter (Signed)
Addressed through another phone note

## 2014-09-18 NOTE — Telephone Encounter (Signed)
Pt left v/m requesting cb about how soon pt should get results from 09/17/14 strep test that was sent to outside lab.

## 2014-09-18 NOTE — Assessment & Plan Note (Signed)
Reviewed hosp records in detail - pt was on OC and casually smoking  May not be able to afford xarelto - will call ins and get opt that are affordable (will call with that)- may need to change to warfarin  Still fair amt of pain - refilled norco for prn cautious use  Will watch for sob or chest pain  Pt did stop smoking -adv to quit for good Also stopped OC - she will f/u with her gyn re: non hormonal alt to tx her period issues  Will likely continue tx for 3-6 mo

## 2014-09-18 NOTE — Telephone Encounter (Signed)
Usually 2-3 days, how are her symptoms?

## 2014-09-18 NOTE — Assessment & Plan Note (Signed)
Neg RST cx taken  Some debris around L tonsil  Poss early uri or viral  Disc symptomatic care - see instructions on AVS  Pend cx  Will update if worse or fever in the meantime

## 2014-09-19 ENCOUNTER — Telehealth: Payer: Self-pay | Admitting: Family Medicine

## 2014-09-19 LAB — CULTURE, GROUP A STREP: ORGANISM ID, BACTERIA: NORMAL

## 2014-09-19 MED ORDER — AMOXICILLIN-POT CLAVULANATE 875-125 MG PO TABS: 1.0000 | ORAL_TABLET | Freq: Two times a day (BID) | ORAL | Status: DC

## 2014-09-19 NOTE — Telephone Encounter (Signed)
-----   Message from Tammi Sou, [place] sent at 09/19/2014  8:30 AM EST ----- Pt notified of throat cx results. Pt said her throat is still hurting really bad, some times if feels worse then when she was here at her appointment. Pt not sure what to do. Please advise

## 2014-09-19 NOTE — Telephone Encounter (Signed)
midol should be ok - but that has acetaminophen in it so do not mix that with your pain medication (which also has acetaminophen)-   Decide which you would rather take, I think that the pain med you have will work better than midol for cramps   Use caution with any nsaids (like aleve or ibuprofen) -- these can increase bleeding risk with xarelto

## 2014-09-19 NOTE — Telephone Encounter (Signed)
Pt left voicemail at Triage. Pt said she just had an OV with Dr. Glori Bickers to address her blood clot/ blood thinner situation. Pt said she is calling because she has started her period and she doesn't know if she "should be looking out for something else." pt said she doesn't know what it's suppose to be like or what she should do. Pt request call back

## 2014-09-19 NOTE — Telephone Encounter (Signed)
I do not think she will notice much difference from being on the blood thinner- however being off the oral contraceptive may make it heavier  Please let me know if any problems

## 2014-09-19 NOTE — Telephone Encounter (Signed)
Please go ahead and call or send in augmentin on spec - I'm not sure when final report will return on the culture  Let me know if any fever or cold symptoms  Update/follow up if no improvement

## 2014-09-19 NOTE — Telephone Encounter (Signed)
Pt notified of Dr. Marliss Coots comments regarding her ST and starting her period, pt wanted me to ask Dr. Glori Bickers with the medication pt is already on is it okay to take Midol OTC and if not what OTC medication can she take for her period sxs (cramps/pain)  abx sent to pharmacy

## 2014-09-19 NOTE — Telephone Encounter (Signed)
Addressed through result notes  

## 2014-09-19 NOTE — Telephone Encounter (Signed)
Pt notified of Dr. Marliss Coots comments/recommendations and verbalized understanding

## 2014-09-25 ENCOUNTER — Telehealth: Payer: Self-pay

## 2014-09-25 NOTE — Telephone Encounter (Signed)
Looks like she was unreachable - hopefully she will call back

## 2014-09-25 NOTE — Telephone Encounter (Signed)
PLEASE NOTE: All timestamps contained within this report are represented as [place] Standard Time. CONFIDENTIALTY NOTICE: This fax transmission is intended only for the addressee. It contains information that is legally privileged, confidential or otherwise protected from use or disclosure. If you are not the intended recipient, you are strictly prohibited from reviewing, disclosing, copying using or disseminating any of this information or taking any action in reliance on or regarding this information. If you have received this fax in error, please notify us immediately by telephone so that we can arrange for its return to [place]. Phone: [phone], Toll-Free: [phone], Fax: [phone] Page: 1 of 1 Call Id: [id number] [place] Patient Name: Carol Johnson Gender: Female DOB: [birth year] Age: 49 Y 11 M 18 D Return Phone Number: [phone] (Primary) Address: City/State/Zip: [place] [address] Client [place] [facility] - Client Client Site [place], [place] Contact Type Call Call Type Triage / Clinical Relationship To Patient Self Appointment Disposition EMR Caller Not Reached Return Phone Number [phone] (Primary) Chief Complaint Diarrhea Initial Comment Caller states she is having diarrhea. Info pasted into Epic No Nurse Assessment Guidelines Guideline Title Affirmed Question Affirmed Notes Nurse Date/Time (Eastern Time) Disp. Time [name] Time) Disposition Final User 09/24/2014 5:19:17 PM Send To RN Personal Thad Ranger, RN, Langley Gauss 09/24/2014 5:20:53 PM Attempt made - no message left Carmon, RN, Langley Gauss 09/24/2014 5:21:59 PM Attempt made - no message left Carmon, RN, Langley Gauss 09/24/2014 5:22:15 PM Send To RN Personal Thad Ranger, RN, Langley Gauss 09/24/2014 6:14:08 PM FINAL ATTEMPT MADE - no message left Yes Carmon, RN, Langley Gauss After Care  Instructions Given Call Event Type User Date / Time Description Comments User: Romeo Apple, RN Date/Time (Eastern Time): 09/24/2014 5:21:17 PM VMB full and unable to leave a msg.

## 2014-09-30 NOTE — Telephone Encounter (Signed)
Since she has had abx -I want to check her stool for c difficile (an infx that can start with taking abx) She will need to come in to get a stool cup to give a sample- let me know when she comes in and I will order it

## 2014-09-30 NOTE — Telephone Encounter (Signed)
Patient stated that she has called several times and when she calls back she can not get thru to the nurse line. Patient stated that she has had diarrhea for 2 weeks. Patient stated that she is on a blood thinner and just finished an antibiotic Saturday. Patient states she is having some cramping with the diarrhea.

## 2014-10-01 NOTE — Telephone Encounter (Signed)
Patient's voicemail box is full. Will try again later.

## 2014-10-02 NOTE — Telephone Encounter (Signed)
Spoken to patient. She will come by on Thursday and pick up the stool cup for c difficile.

## 2014-10-03 ENCOUNTER — Telehealth: Payer: Self-pay

## 2014-10-03 DIAGNOSIS — R197 Diarrhea, unspecified: Secondary | ICD-10-CM

## 2014-10-03 NOTE — Telephone Encounter (Signed)
Pt left v/m; pt request xarelto sent to [facility]; pt said pharmacy is trying to get refilled thru ED and pt wants Dr Glori Bickers to refill med. Pt was prescribed med due to blood clot in her leg. Pt also request prior auth done so med will not cost $500.00 to pt. Pt is afraid of what might happen if pt cannot take med. Pt will need med refilled by 10/07/14.pt request cb.

## 2014-10-03 NOTE — Telephone Encounter (Signed)
Is she still on the starter pack or is she up to the 20 mg daily?  Please send for the prior auth- if ins will not cover we will have to go with warfarin most likely

## 2014-10-04 MED ORDER — RIVAROXABAN 20 MG PO TABS: 20.0000 mg | ORAL_TABLET | Freq: Every day | ORAL | Status: DC

## 2014-10-04 NOTE — Telephone Encounter (Signed)
Tried to call patient at home number and voicemail box is full. Tried to call patient at mobile number but got hung up on. Will try again later.

## 2014-10-04 NOTE — Telephone Encounter (Signed)
Patient returned call.  She said she's still on the Starter Pack, but she is due to start the 20 mg daily on Tuesday.  She said she'd have to have her medication by Tuesday.  Patient said if the insurance won't cover it, she'd like to try Eloquis.  You can call patient back at [phone].

## 2014-10-04 NOTE — Telephone Encounter (Signed)
I will write px for call in and route to [facility] so we can call for her prior auth  Thanks

## 2014-10-07 NOTE — Telephone Encounter (Signed)
Prior authorization approved. Xarelto 20 mg will approved from 09/17/2014 to 04/05/2016 Case# [id number]

## 2014-10-07 NOTE — Telephone Encounter (Signed)
Tried to faxed on 10/04/14 and 10/07/14 to [facility] but patient was not found in their system. Finally called on 10/07/14 and still patient is not found in their system. On file we have patient under [employer]. Just received a faxed today from [facility] script[facility]. An[employer] Boss is working on the prior [place].

## 2014-10-07 NOTE — Telephone Encounter (Signed)
Great -thanks for that - let me know if there is anything I need to do additionally

## 2014-10-08 LAB — C. DIFFICILE GDH AND TOXIN A/B
C. DIFFICILE GDH: NOT DETECTED
C. difficile Toxin A/B: NOT DETECTED

## 2014-10-10 ENCOUNTER — Telehealth: Payer: Self-pay

## 2014-10-10 MED ORDER — HYOSCYAMINE SULFATE ER 0.375 MG PO TB12: 0.3750 mg | ORAL_TABLET | Freq: Two times a day (BID) | ORAL | Status: DC

## 2014-10-10 NOTE — Telephone Encounter (Signed)
Please call in levbid to use for abdominal cramping prn  Also try a fiber supplement like citrucel daily-this may slow down diarrhea Alert me if worse or no improvement

## 2014-10-10 NOTE — Telephone Encounter (Signed)
Called patient and notified her of Dr Marliss Coots comments. Patient stated that she had IBS in the past and requesting a rx to help with it. She stated she has abdominal pain still but no fever, no dark or black stool. Please advise.

## 2014-10-10 NOTE — Telephone Encounter (Signed)
Called in levbid to [facility]. Notified patient as well. Patient will let us know if worse or no improvement.

## 2014-10-10 NOTE — Telephone Encounter (Signed)
Her c diff test was neg so that is reassuring  When I looked up xarelto diarrhea is not noted in side effect profile at all (not even listed as a rare side effect) The antibiotics should be out of her system now - perhaps a pro biotic like align otc could help restore her gut natural bacteria  Is she having cramping/abd pain/fever/ dark or black stool?

## 2014-10-10 NOTE — Telephone Encounter (Signed)
Pt left v/m requesting cb to get results of recent labs and pt is concerned may be allergie to xarelto. Pt cannot get on mychart and pt notified recent lab result per mychart note.  Pt said continuing diarrhea and pt wants to know if this could be allergy or side effect to xarelto; pt is supposed to get xarelto refilled on 10/11/14 and xarelto is expensive and does not want to pick up and then later be told to stop med.  In last 24 hours pt has had 5 watery stools; no blood or mucus seen.Pt request cb today. [facility].

## 2014-10-29 ENCOUNTER — Other Ambulatory Visit: Payer: Self-pay | Admitting: Family Medicine

## 2014-11-09 ENCOUNTER — Other Ambulatory Visit: Payer: Self-pay | Admitting: Family Medicine

## 2014-11-25 ENCOUNTER — Telehealth: Payer: Self-pay | Admitting: Gastroenterology

## 2014-11-26 NOTE — Telephone Encounter (Signed)
Patient last seen here 05/2014 for other issues. She is calling now because she has had persistent diarrhea. Her PCP did lab for c-diff with negative results. She is taking Levbid without relief of her cramps. She has not taken anything OTC. She has not altered her diet. Agrees to an appointment for evaluation.

## 2014-11-27 ENCOUNTER — Encounter: Payer: Self-pay | Admitting: Gastroenterology

## 2014-11-27 ENCOUNTER — Ambulatory Visit (INDEPENDENT_AMBULATORY_CARE_PROVIDER_SITE_OTHER): Payer: 59 | Admitting: Gastroenterology

## 2014-11-27 ENCOUNTER — Other Ambulatory Visit: Payer: Self-pay | Admitting: Family Medicine

## 2014-11-27 VITALS — BP 160/110 | HR 80 | Ht 61.75 in | Wt 175.4 lb

## 2014-11-27 DIAGNOSIS — R197 Diarrhea, unspecified: Secondary | ICD-10-CM | POA: Diagnosis not present

## 2014-11-27 DIAGNOSIS — G4700 Insomnia, unspecified: Secondary | ICD-10-CM | POA: Diagnosis not present

## 2014-11-27 MED ORDER — NA SULFATE-K SULFATE-MG SULF 17.5-3.13-1.6 GM/177ML PO SOLN: 1.0000 | Freq: Once | ORAL | Status: DC

## 2014-11-27 MED ORDER — ZOLPIDEM TARTRATE ER 12.5 MG PO TBCR: 12.5000 mg | EXTENDED_RELEASE_TABLET | Freq: Every evening | ORAL | Status: DC | PRN

## 2014-11-27 NOTE — Progress Notes (Signed)
      History of Present Illness:  Carol Johnson has returned for evaluation of diarrhea.  Over the last 7-8 weeks she's been suffering from severe diarrhea consisting of loose and watery stools containing blood and mucus.  May have 3-4 bowel movements a day.  She rarely awakens to move her bowels.  Diarrhea is accompanied by urgency.  She took an antibiotic about 6 or 8 weeks ago although she thinks diarrhea may have preceded this.  Stools dificile toxin in February was negative.  She was started on Xarelto for DVT.  There've been no other medication or dietary changes.    Review of Systems: She complains of insomnia.  Into this improved with Xanax.  Pertinent positive and negative review of systems were noted in the above HPI section. All other review of systems were otherwise negative.    Current Medications, Allergies, Past Medical History, Past Surgical History, Family History and Social History were reviewed in [facility] record  Vital signs were reviewed in today's medical record. Physical Exam: General: Well developed , well nourished, no acute distress Skin: anicteric Head: Normocephalic and atraumatic Eyes:  sclerae anicteric, EOMI Ears: Normal auditory acuity Mouth: No deformity or lesions Lungs: Clear throughout to auscultation Heart: Regular rate and rhythm; no murmurs, rubs or bruits Abdomen: Soft, non tender and non distended. No masses, hepatosplenomegaly or hernias noted. Normal Bowel sounds Rectal:deferred Musculoskeletal: Symmetrical with no gross deformities  Pulses:  Normal pulses noted Extremities: No clubbing, cyanosis, edema or deformities noted Neurological: Alert oriented x 4, grossly nonfocal Psychological:  Alert and cooperative. Normal mood and affect  See Assessment and Plan under Problem List

## 2014-11-27 NOTE — Assessment & Plan Note (Signed)
While diarrhea could be due to IBS, enteric infection and inflammatory bowel disease are considerations.  Note patient is on Xarelto which certainly may exacerbate any rectal bleeding.  Recon  Recommendations #1 stool pathogen panel and lactoferrin #2 colonoscopy pending #1.  She may stay on Xarelto

## 2014-11-27 NOTE — Assessment & Plan Note (Signed)
trial of Ambien 10 mg daily at bedtime

## 2014-11-27 NOTE — Patient Instructions (Signed)
STAY ON XARELTO PER DR KAPLAN  Go to the basement for labs today  You have been scheduled for a colonoscopy. Please follow written instructions given to you at your visit today.  Please pick up your prep supplies at the pharmacy within the next 1-3 days. If you use inhalers (even only as needed), please bring them with you on the day of your procedure. Your physician has requested that you go to www.startemmi.com and enter the access code given to you at your visit today. This web site gives a general overview about your procedure. However, you should still follow specific instructions given to you by our office regarding your preparation for the procedure.

## 2014-11-29 ENCOUNTER — Other Ambulatory Visit: Payer: 59

## 2014-11-29 DIAGNOSIS — R197 Diarrhea, unspecified: Secondary | ICD-10-CM

## 2014-11-29 DIAGNOSIS — G4700 Insomnia, unspecified: Secondary | ICD-10-CM

## 2014-11-30 LAB — FECAL LACTOFERRIN, QUANT: Lactoferrin: NEGATIVE

## 2014-12-02 LAB — GASTROINTESTINAL PATHOGEN PANEL PCR
C. DIFFICILE TOX A/B, PCR: NEGATIVE
CRYPTOSPORIDIUM, PCR: NEGATIVE
Campylobacter, PCR: NEGATIVE
E COLI (ETEC) LT/ST, PCR: NEGATIVE
E COLI 0157, PCR: NEGATIVE
E coli (STEC) stx1/stx2, PCR: NEGATIVE
Giardia lamblia, PCR: NEGATIVE
Norovirus, PCR: NEGATIVE
ROTAVIRUS, PCR: NEGATIVE
SHIGELLA, PCR: NEGATIVE
Salmonella, PCR: NEGATIVE

## 2014-12-05 ENCOUNTER — Other Ambulatory Visit: Payer: Self-pay

## 2014-12-05 ENCOUNTER — Telehealth: Payer: Self-pay | Admitting: Gastroenterology

## 2014-12-05 ENCOUNTER — Encounter: Payer: Self-pay | Admitting: Gastroenterology

## 2014-12-05 MED ORDER — HYOSCYAMINE SULFATE ER 0.375 MG PO TB12
ORAL_TABLET | ORAL | Status: DC
Start: 2014-12-05 — End: 2015-01-16

## 2014-12-05 NOTE — Telephone Encounter (Signed)
Patient advised. She will keep [place] up dated on how she does. New Rx sent to the pharmacy.

## 2014-12-05 NOTE — Telephone Encounter (Signed)
Diarrhea is improving as she continues Publishing copy. Stools are loose but not watery. She has stabbing pain before her bowel movement. Says this pain is in different places of her abdomen each time. She is on Hycomax. She also says it is hard to describe her pain. Any suggestions?

## 2014-12-05 NOTE — Telephone Encounter (Signed)
Continue hyomax.  Can increase to every 8 hours as needed.  Should diarrhea subside we can cancel colonoscopy

## 2014-12-20 ENCOUNTER — Encounter: Payer: Self-pay | Admitting: Family Medicine

## 2014-12-20 ENCOUNTER — Ambulatory Visit (INDEPENDENT_AMBULATORY_CARE_PROVIDER_SITE_OTHER): Payer: 59 | Admitting: Family Medicine

## 2014-12-20 VITALS — BP 146/96 | HR 81 | Temp 98.5°F | Ht 61.75 in | Wt 173.5 lb

## 2014-12-20 DIAGNOSIS — I82402 Acute embolism and thrombosis of unspecified deep veins of left lower extremity: Secondary | ICD-10-CM

## 2014-12-20 DIAGNOSIS — F411 Generalized anxiety disorder: Secondary | ICD-10-CM | POA: Insufficient documentation

## 2014-12-20 DIAGNOSIS — I10 Essential (primary) hypertension: Secondary | ICD-10-CM | POA: Diagnosis not present

## 2014-12-20 MED ORDER — AMLODIPINE BESYLATE 5 MG PO TABS
5.0000 mg | ORAL_TABLET | Freq: Every day | ORAL | Status: DC
Start: 2014-12-20 — End: 2015-07-18

## 2014-12-20 MED ORDER — ALPRAZOLAM 0.5 MG PO TABS: 0.5000 mg | ORAL_TABLET | Freq: Two times a day (BID) | ORAL | Status: DC | PRN

## 2014-12-20 NOTE — Progress Notes (Signed)
Pre visit review using our clinic review tool, if applicable. No additional management support is needed unless otherwise documented below in the visit note. 

## 2014-12-20 NOTE — Assessment & Plan Note (Signed)
Doing well - symptoms are gone No longer smokes  Not on OC  Will continue xarelto to mid July (for 6 mo course)

## 2014-12-20 NOTE — Progress Notes (Signed)
Subjective:    Patient ID: Carol Johnson, female    DOB: March 13, 1966, 49 y.o.   MRN: [id number]  HPI Here for f/u of chronic health problems   Had DVT in mid January  On xarelto  No side effects  No more pain in her leg  No swelling   Off OC  No periods  Thinks she is menopausal - but not hot flashes or night sweats    bp is stable today - still elevated for 3rd reading at [employer] - has not felt well  No cp or palpitations or headaches or edema  No side effects to medicines  BP Readings from Last 3 Encounters:  12/20/14 146/96  11/27/14 160/110  09/17/14 156/88    Does not have headaches  Is quite stressed out - "not calm" - is fidgity and wringing hands and very worried about stomach issue    No longer smokes     Still on florastar for diarrhea- seeing GI  (improved a little bit but not significantly) Will have a colonoscopy on May 5th   Patient Active Problem List   Diagnosis Date Noted  . Anxiety state 12/20/2014  . Diarrhea 11/27/2014  . Insomnia 11/27/2014  . Sore throat 09/17/2014  . Left leg DVT 09/17/2014  . Sleep disorder 09/02/2014  . Gastritis, chronic 06/06/2014  . Unspecified gastritis and gastroduodenitis without mention of hemorrhage 05/03/2014  . Abdominal pain, epigastric 04/19/2014  . Internal hemorrhoids with other complication 33/29/5188  . Obese 06/27/2013  . Routine general medical examination at a health care facility 10/14/2011  . Thyroid nodule 05/16/2011  . Rectal bleeding 11/24/2010  . Hemorrhoids, internal 11/24/2010  . Irritable bowel syndrome (IBS) 11/24/2010  . ANEMIA 10/02/2009  . HYPERLIPIDEMIA 07/18/2009  . Essential hypertension 07/18/2009  . DYSPNEA ON EXERTION 07/18/2009  . THYROID CYST 01/31/2008  . ANXIETY 01/31/2008  . Depression 01/31/2008  . GASTRIC ULCER, HX OF 01/31/2008  . FIBROIDS, UTERUS 11/13/2007  . ALLERGIC RHINITIS 11/13/2007  . ASTHMA 11/13/2007  . GERD 11/13/2007  . IRRITABLE BOWEL SYNDROME  11/13/2007  . INTERNAL HEMORRHOIDS 11/12/2003   Past Medical History  Diagnosis Date  . Internal hemorrhoid   . GERD (gastroesophageal reflux disease)   . Irritable bowel syndrome   . Fibroid uterus   . Allergic rhinitis   . Hypercholesterolemia   . Asthma   . URI (upper respiratory infection)   . Anxiety   . Depression   . Abdominal pain, unspecified site   . Pneumonia 1998  . Uterine fibroid   . DVT (deep venous thrombosis)    Past Surgical History  Procedure Laterality Date  . Myomectomy    . Uterine fibroid surgery    . Cholecystectomy      laparoscopic "gallstones"  . Cesarean section  2004  . Esophagogastroduodenoscopy (egd) with propofol N/A 05/03/2014    Procedure: ESOPHAGOGASTRODUODENOSCOPY (EGD) WITH PROPOFOL;  Surgeon: Inda Castle, MD;  Location: [facility];  Service: Endoscopy;  Laterality: N/A;   History  Substance Use Topics  . Smoking status: Former Research scientist (life sciences)  . Smokeless tobacco: Never Used     Comment: as a teenager-none recent  . Alcohol Use: 0.6 oz/week    1 Standard drinks or equivalent per week     Comment: occ   Family History  Problem Relation Age of Onset  . Colon cancer Paternal Aunt   . Uterine cancer Paternal Grandmother   . Heart disease Maternal Grandmother   . Heart disease Paternal  Uncle   . Heart disease Paternal Aunt   . Kidney disease Cousin    Allergies  Allergen Reactions  . Sertraline Hcl     REACTION: diarrhea  . Zoloft [Sertraline Hcl] Diarrhea   Current Outpatient Prescriptions on File Prior to Visit  Medication Sig Dispense Refill  . buPROPion (WELLBUTRIN XL) 150 MG 24 hr tablet Take 1 tablet (150 mg total) by mouth daily. 30 tablet 11  . hyoscyamine (LEVBID) 0.375 MG 12 hr tablet Take 1 tablet every 8 hours 90 tablet 0  . Ibuprofen-Diphenhydramine Cit (IBUPROFEN PM PO) Take 2 tablets by mouth at bedtime as needed (for pain/sleep).    . pantoprazole (PROTONIX) 40 MG tablet Take 1 tablet (40 mg total) by mouth daily.  30 tablet 11  . rivaroxaban (XARELTO) 20 MG TABS tablet Take 1 tablet (20 mg total) by mouth daily with supper. 30 tablet 5  . zolpidem (AMBIEN CR) 12.5 MG CR tablet Take 1 tablet (12.5 mg total) by mouth at bedtime as needed for sleep. 30 tablet 0  . Na Sulfate-K Sulfate-Mg Sulf (SUPREP BOWEL PREP) SOLN Take 1 kit by mouth once. (Patient not taking: Reported on 12/20/2014) 1 Bottle 0   No current facility-administered medications on file prior to visit.     Review of Systems Review of Systems  Constitutional: Negative for fever, appetite change, fatigue and unexpected weight change.  Eyes: Negative for pain and visual disturbance.  Respiratory: Negative for cough and shortness of breath.   Cardiovascular: Negative for cp or palpitations    Gastrointestinal: Negative for nausea,  and constipation. pos for diarrhea, neg for rectal bleeding  Genitourinary: Negative for urgency and frequency.  Skin: Negative for pallor or rash   Neurological: Negative for weakness, light-headedness, numbness and headaches.  Hematological: Negative for adenopathy. Does not bruise/bleed easily.  Psychiatric/Behavioral: Negative for dysphoric mood. Pos for anxiety and stressors        Objective:   Physical Exam  Constitutional: She appears well-developed and well-nourished. No distress.  HENT:  Head: Normocephalic and atraumatic.  Mouth/Throat: Oropharynx is clear and moist.  Eyes: Conjunctivae and EOM are normal. Pupils are equal, round, and reactive to light. No scleral icterus.  Neck: Normal range of motion. Neck supple. No JVD present. Carotid bruit is not present. No thyromegaly present.  Cardiovascular: Normal rate, regular rhythm, normal heart sounds and intact distal pulses.  Exam reveals no gallop.   Pulmonary/Chest: Effort normal and breath sounds normal. No respiratory distress. She has no wheezes. She exhibits no tenderness.  Abdominal: Soft. Bowel sounds are normal. She exhibits no distension,  no abdominal bruit and no mass. There is no tenderness.  Musculoskeletal: Normal range of motion. She exhibits no edema.  Lymphadenopathy:    She has no cervical adenopathy.  Neurological: She is alert. She has normal reflexes. No cranial nerve deficit. She exhibits normal muscle tone. Coordination normal.  Skin: Skin is warm and dry. No rash noted. No erythema. No pallor.  Psychiatric: Her speech is normal and behavior is normal. Thought content normal. Her mood appears anxious. Her affect is not blunt, not labile and not inappropriate. Thought content is not paranoid. She expresses no homicidal and no suicidal ideation.  Pt is at times tearful when discussing her stressors           Assessment & Plan:   Problem List Items Addressed This Visit      Cardiovascular and Mediastinum   Essential hypertension    BP continues to be  elevated BP Readings from Last 3 Encounters:  12/20/14 146/96  11/27/14 160/110  09/17/14 156/88    ? If only stress related or hereditary  Is very stressed/worried  Start amlodipine 5 mg daily in am  F/u in mid may  Rev poss side eff- update if problems      Relevant Medications   amLODipine (NORVASC) 5 MG tablet   Left leg DVT    Doing well - symptoms are gone No longer smokes  Not on OC  Will continue xarelto to mid July (for 6 mo course)       Relevant Medications   amLODipine (NORVASC) 5 MG tablet     Other   Anxiety state - Primary    Long disc re: anxiety  Many stressors  Unable to tol SSRI due to diarrhea (which she is in the midst of now)- for colonosc soon   Will try short term xanax .5 up to bid prn  Disc habit pot and sed Re eval after diarrhea resolves Offered counseling ref-pt cannot afford       Relevant Medications   ALPRAZolam (XANAX) 0.5 MG tablet

## 2014-12-20 NOTE — Patient Instructions (Signed)
Try xanax for anxiety as needed- using caution for habit potential and sedation  Lets continue xarelto through mid July (6 months total)  Start amlodipine daily in am for blood pressure   Follow up with me in mid may for blood pressure

## 2014-12-20 NOTE — Assessment & Plan Note (Signed)
Long disc re: anxiety  Many stressors  Unable to tol SSRI due to diarrhea (which she is in the midst of now)- for colonosc soon   Will try short term xanax .5 up to bid prn  Disc habit pot and sed Re eval after diarrhea resolves Offered counseling ref-pt cannot afford

## 2014-12-20 NOTE — Assessment & Plan Note (Signed)
BP continues to be elevated BP Readings from Last 3 Encounters:  12/20/14 146/96  11/27/14 160/110  09/17/14 156/88    ? If only stress related or hereditary  Is very stressed/worried  Start amlodipine 5 mg daily in am  F/u in mid may  Rev poss side eff- update if problems

## 2014-12-30 ENCOUNTER — Telehealth: Payer: Self-pay | Admitting: *Deleted

## 2014-12-30 DIAGNOSIS — M79604 Pain in right leg: Secondary | ICD-10-CM | POA: Insufficient documentation

## 2014-12-30 NOTE — Telephone Encounter (Signed)
Pt notified of Dr. Marliss Coots comments/recommendations. Pt hasn't missed/stopped xarelto but does want the doppler, please put referral in, pt said no other sxs except it is sore at the area but no fever, bug bites, or rash, I advise pt Marion/Linda will call to schedule appt

## 2014-12-30 NOTE — Telephone Encounter (Signed)
She should not get a clot when she is on the xarelto- let me know if she has missed doses or stopped it  We can order a doppler however - is the area red and painful? Any other symptoms ? Any rash/ insect bites/fever etc?

## 2014-12-30 NOTE — Telephone Encounter (Signed)
Patient called stating that she thinks that she may be developing a clot in her right calf now. Patient stated that there is a spot in her right calf that feels sore, but not as bad as before with the other leg. Patient wants to know if she should come back to see you, increase her medication or go for a scan of her leg? Please advised.

## 2014-12-30 NOTE — Telephone Encounter (Signed)
I ordered it - will not let me sign order because I do not know where she wants to go - will route to [facility] and then I can sign it after they decide

## 2015-01-01 NOTE — Telephone Encounter (Signed)
Called patient to schedule the Doppler of her leg and she said the pain has stopped and now she doesn't feel she needs a doppler anymore. Please cancel the order in Epic.

## 2015-01-01 NOTE — Telephone Encounter (Signed)
I never signed the order to begin with because I did not know where she was going to go -so I do not think it is there to cancel Thanks

## 2015-01-01 NOTE — Telephone Encounter (Signed)
Please close encounter when completed.

## 2015-01-02 ENCOUNTER — Other Ambulatory Visit: Payer: Self-pay | Admitting: Gastroenterology

## 2015-01-02 ENCOUNTER — Other Ambulatory Visit (INDEPENDENT_AMBULATORY_CARE_PROVIDER_SITE_OTHER): Payer: 59

## 2015-01-02 ENCOUNTER — Ambulatory Visit (AMBULATORY_SURGERY_CENTER): Payer: 59 | Admitting: Gastroenterology

## 2015-01-02 ENCOUNTER — Encounter: Payer: Self-pay | Admitting: Gastroenterology

## 2015-01-02 ENCOUNTER — Other Ambulatory Visit: Payer: Self-pay | Admitting: *Deleted

## 2015-01-02 VITALS — BP 153/85 | HR 76 | Temp 99.1°F | Resp 22 | Ht 61.75 in | Wt 173.0 lb

## 2015-01-02 DIAGNOSIS — R197 Diarrhea, unspecified: Secondary | ICD-10-CM

## 2015-01-02 DIAGNOSIS — K52832 Lymphocytic colitis: Secondary | ICD-10-CM

## 2015-01-02 DIAGNOSIS — K529 Noninfective gastroenteritis and colitis, unspecified: Secondary | ICD-10-CM | POA: Diagnosis not present

## 2015-01-02 DIAGNOSIS — K648 Other hemorrhoids: Secondary | ICD-10-CM

## 2015-01-02 HISTORY — DX: Lymphocytic colitis: K52.832

## 2015-01-02 LAB — CBC WITH DIFFERENTIAL/PLATELET
BASOS PCT: 0.6 % (ref 0.0–3.0)
Basophils Absolute: 0.1 10*3/uL (ref 0.0–0.1)
EOS PCT: 5.4 % — AB (ref 0.0–5.0)
Eosinophils Absolute: 0.5 10*3/uL (ref 0.0–0.7)
HEMATOCRIT: 33.1 % — AB (ref 36.0–46.0)
Hemoglobin: 10.4 g/dL — ABNORMAL LOW (ref 12.0–15.0)
LYMPHS ABS: 1.8 10*3/uL (ref 0.7–4.0)
LYMPHS PCT: 19.8 % (ref 12.0–46.0)
MCHC: 31.4 g/dL (ref 30.0–36.0)
MCV: 63.9 fl — ABNORMAL LOW (ref 78.0–100.0)
MONOS PCT: 4.8 % (ref 3.0–12.0)
Monocytes Absolute: 0.4 10*3/uL (ref 0.1–1.0)
Neutro Abs: 6.4 10*3/uL (ref 1.4–7.7)
Neutrophils Relative %: 69.4 % (ref 43.0–77.0)
Platelets: 519 10*3/uL — ABNORMAL HIGH (ref 150.0–400.0)
RBC: 5.18 Mil/uL — AB (ref 3.87–5.11)
RDW: 19.4 % — ABNORMAL HIGH (ref 11.5–15.5)
WBC: 9.2 10*3/uL (ref 4.0–10.5)

## 2015-01-02 LAB — COMPREHENSIVE METABOLIC PANEL
ALK PHOS: 91 U/L (ref 39–117)
ALT: 19 U/L (ref 0–35)
AST: 17 U/L (ref 0–37)
Albumin: 4 g/dL (ref 3.5–5.2)
BUN: 5 mg/dL — ABNORMAL LOW (ref 6–23)
CO2: 24 mEq/L (ref 19–32)
Calcium: 9.4 mg/dL (ref 8.4–10.5)
Chloride: 105 mEq/L (ref 96–112)
Creatinine, Ser: 0.72 mg/dL (ref 0.40–1.20)
GFR: 91.04 mL/min (ref >60.00)
Glucose, Bld: 83 mg/dL (ref 70–99)
Potassium: 3.5 mEq/L (ref 3.5–5.1)
SODIUM: 138 meq/L (ref 135–145)
TOTAL PROTEIN: 7.5 g/dL (ref 6.0–8.3)
Total Bilirubin: 0.4 mg/dL (ref 0.2–1.2)

## 2015-01-02 LAB — C-REACTIVE PROTEIN: CRP: 1.1 mg/dL (ref 0.5–20.0)

## 2015-01-02 LAB — PROTIME-INR
INR: 1.2 ratio — AB (ref 0.8–1.0)
PROTHROMBIN TIME: 13 s (ref 9.6–13.1)

## 2015-01-02 MED ORDER — SODIUM CHLORIDE 0.9 % IV SOLN: 500.0000 mL | INTRAVENOUS | Status: DC

## 2015-01-02 NOTE — Progress Notes (Signed)
Awake alert, pleased with MAC.Report to RN

## 2015-01-02 NOTE — Progress Notes (Signed)
Dr. Sheran Luz RN  Pt. To lab for CBC, CRP, C-Met ,INR AND CELIAC PANEL TODAY.

## 2015-01-02 NOTE — Op Note (Addendum)
[facility]  [address] [place], [address]   COLONOSCOPY PROCEDURE REPORT  PATIENT: Carol Johnson, Carol Johnson  MR#: [id number] BIRTHDATE: 1965/12/13 , 49  yrs. old GENDER: female ENDOSCOPIST: Inda Castle, MD REFERRED BY: PROCEDURE DATE:  01/02/2015 PROCEDURE:   Colonoscopy, diagnostic and Colonoscopy with biopsy First Screening Colonoscopy - Avg.  risk and is 50 yrs.  old or older - No.  Prior Negative Screening - Now for repeat screening. 10 or more years since last screening  History of Adenoma - Now for follow-up colonoscopy & has been > or = to 3 yrs.  N/A ASA CLASS:   Class II INDICATIONS:Clinically significant diarrhea of unexplained origin.  MEDICATIONS: Monitored anesthesia care and Propofol 350 mg IV  DESCRIPTION OF PROCEDURE:   After the risks benefits and alternatives of the procedure were thoroughly explained, informed consent was obtained.  The digital rectal exam revealed no abnormalities of the rectum.   The LB [id number]  endoscope was introduced through the anus and advanced to the ileum. No adverse events experienced.   The quality of the prep was (Suprep was used) good.  The instrument was then slowly withdrawn as the colon was fully examined.      COLON FINDINGS: Internal hemorrhoids were found.   The examination was otherwise normal.  Random biopsies were taken throughout the colon to rule out microscopic colitis.Retroflexed views revealed no abnormalities. The time to cecum = 2.8 Withdrawal time = 8.9   The scope was withdrawn and the procedure completed. COMPLICATIONS: There were no immediate complications.  ENDOSCOPIC IMPRESSION: 1.   Internal hemorrhoids 2.   The examination was otherwise normal  RECOMMENDATIONS: 1.  Await biopsy results 2.  Colonoscopy 10 years 3.  OV 2-3 weeks  eSigned:  Inda Castle, MD 01/07/2015 7:53 PM Revised: 01/07/2015 7:53 PM  cc:

## 2015-01-02 NOTE — Patient Instructions (Signed)
YOU HAD AN ENDOSCOPIC PROCEDURE TODAY AT [facility]:   Refer to the procedure report that was given to you for any specific questions about what was found during the examination.  If the procedure report does not answer your questions, please call your gastroenterologist to clarify.  If you requested that your care partner not be given the details of your procedure findings, then the procedure report has been included in a sealed envelope for you to review at your convenience later.  YOU SHOULD EXPECT: Some feelings of bloating in the abdomen. Passage of more gas than usual.  Walking can help get rid of the air that was put into your GI tract during the procedure and reduce the bloating. If you had a lower endoscopy (such as a colonoscopy or flexible sigmoidoscopy) you may notice spotting of blood in your stool or on the toilet paper. If you underwent a bowel prep for your procedure, you may not have a normal bowel movement for a few days.  Please Note:  You might notice some irritation and congestion in your nose or some drainage.  This is from the oxygen used during your procedure.  There is no need for concern and it should clear up in a day or so.  SYMPTOMS TO REPORT IMMEDIATELY:   Following lower endoscopy (colonoscopy or flexible sigmoidoscopy):  Excessive amounts of blood in the stool  Significant tenderness or worsening of abdominal pains  Swelling of the abdomen that is new, acute  Fever of 100F or higher    For urgent or emergent issues, a gastroenterologist can be reached at any hour by calling [phone].   DIET: Your first meal following the procedure should be a small meal and then it is ok to progress to your normal diet. Heavy or fried foods are harder to digest and may make you feel nauseous or bloated.  Likewise, meals heavy in dairy and vegetables can increase bloating.  Drink plenty of fluids but you should avoid alcoholic beverages for 24  hours.  ACTIVITY:  You should plan to take it easy for the rest of today and you should NOT DRIVE or use heavy machinery until tomorrow (because of the sedation medicines used during the test).    FOLLOW UP: Our staff will call the number listed on your records the next business day following your procedure to check on you and address any questions or concerns that you may have regarding the information given to you following your procedure. If we do not reach you, we will leave a message.  However, if you are feeling well and you are not experiencing any problems, there is no need to return our call.  We will assume that you have returned to your regular daily activities without incident.  If any biopsies were taken you will be contacted by phone or by letter within the next 1-3 weeks.  Please call us at [phone] if you have not heard about the biopsies in 3 weeks.    SIGNATURES/CONFIDENTIALITY: You and/or your care partner have signed paperwork which will be entered into your electronic medical record.  These signatures attest to the fact that that the information above on your After Visit Summary has been reviewed and is understood.  Full responsibility of the confidentiality of this discharge information lies with you and/or your care-partner.  Hemorrhoid information given.

## 2015-01-02 NOTE — Progress Notes (Signed)
Called to room to assist during endoscopic procedure.  Patient ID and intended procedure confirmed with present staff. Received instructions for my participation in the procedure from the performing physician.  

## 2015-01-02 NOTE — Progress Notes (Signed)
PT. TO LAB AT DISCHARGE.

## 2015-01-03 ENCOUNTER — Other Ambulatory Visit: Payer: Self-pay

## 2015-01-03 ENCOUNTER — Telehealth: Payer: Self-pay | Admitting: *Deleted

## 2015-01-03 LAB — GLIA (IGA/G) + TTG IGA
GLIADIN IGA: 9 U (ref <20)
GLIADIN IGG: 3 U (ref <20)
TISSUE TRANSGLUTAMINASE AB, IGA: 1 U/mL (ref <4)

## 2015-01-03 NOTE — Telephone Encounter (Signed)
  Follow up Call-  Call back number 01/02/2015  Post procedure Call Back phone  # [phone]  Permission to leave phone message Yes     No answer at # given.  Left message on voicemail.

## 2015-01-07 ENCOUNTER — Encounter: Payer: Self-pay | Admitting: Gastroenterology

## 2015-01-08 ENCOUNTER — Other Ambulatory Visit: Payer: Self-pay

## 2015-01-08 MED ORDER — MESALAMINE 1.2 G PO TBEC: 2.4000 g | DELAYED_RELEASE_TABLET | Freq: Every day | ORAL | Status: DC

## 2015-01-08 NOTE — Telephone Encounter (Signed)
Tried to close the encounter and unable to because there is an order pending that needs to be cancelled. Please cancel the order and close the encounter. Thanks

## 2015-01-13 ENCOUNTER — Ambulatory Visit: Payer: 59 | Admitting: Family Medicine

## 2015-01-16 ENCOUNTER — Ambulatory Visit (INDEPENDENT_AMBULATORY_CARE_PROVIDER_SITE_OTHER): Payer: 59 | Admitting: Gastroenterology

## 2015-01-16 ENCOUNTER — Encounter: Payer: Self-pay | Admitting: Gastroenterology

## 2015-01-16 VITALS — BP 142/100 | HR 88 | Ht 61.75 in | Wt 178.0 lb

## 2015-01-16 DIAGNOSIS — K5289 Other specified noninfective gastroenteritis and colitis: Secondary | ICD-10-CM

## 2015-01-16 DIAGNOSIS — D509 Iron deficiency anemia, unspecified: Secondary | ICD-10-CM | POA: Diagnosis not present

## 2015-01-16 DIAGNOSIS — K52832 Lymphocytic colitis: Secondary | ICD-10-CM | POA: Insufficient documentation

## 2015-01-16 MED ORDER — FERROUS SULFATE 325 (65 FE) MG PO TABS: 325.0000 mg | ORAL_TABLET | Freq: Two times a day (BID) | ORAL | Status: DC

## 2015-01-16 MED ORDER — FERROUS SULFATE 75 (15 FE) MG/ML PO SOLN: 15.0000 mg | Freq: Two times a day (BID) | ORAL | Status: DC

## 2015-01-16 NOTE — Patient Instructions (Addendum)
You are scheduled to follow up 03/28/15 at 9:15am.  We have sent medications to your pharmacy for you to pick up at your convenience.  Call in 1 week with progress of how you are feeling.

## 2015-01-16 NOTE — Assessment & Plan Note (Signed)
Patient has had a moderate response to lialda.  If she plateaus I will switch her to budesonide.

## 2015-01-16 NOTE — Assessment & Plan Note (Signed)
Plan to begin iron supplementation

## 2015-01-16 NOTE — Progress Notes (Signed)
      History of Present Illness:  Carol Johnson was diagnosed with lymphocytic colitis by recent colonoscopy.  On lialda symptoms have improved although she still has loose stools 2-3 times a day.  Urgency is less.    Review of Systems: Pertinent positive and negative review of systems were noted in the above HPI section. All other review of systems were otherwise negative.    Current Medications, Allergies, Past Medical History, Past Surgical History, Family History and Social History were reviewed in [facility] record  Vital signs were reviewed in today's medical record. Physical Exam: General: Well developed , well nourished, no acute distress  See Assessment and Plan under Problem List

## 2015-01-16 NOTE — Addendum Note (Signed)
Addended by: Oda Kilts on: 01/16/2015 10:52 AM   Modules accepted: Orders, Medications

## 2015-01-17 ENCOUNTER — Other Ambulatory Visit: Payer: 59

## 2015-01-19 ENCOUNTER — Telehealth: Payer: Self-pay | Admitting: Family Medicine

## 2015-01-19 DIAGNOSIS — Z0000 Encounter for general adult medical examination without abnormal findings: Secondary | ICD-10-CM

## 2015-01-19 NOTE — Telephone Encounter (Signed)
-----   Message from Ellamae Sia sent at 01/14/2015  3:35 PM EDT ----- Regarding: Lab orders for Friday, 5.20.16 Patient is scheduled for CPX labs, please order future labs, Thanks , Karna Christmas

## 2015-01-24 ENCOUNTER — Telehealth: Payer: Self-pay | Admitting: Family Medicine

## 2015-01-24 ENCOUNTER — Encounter: Payer: 59 | Admitting: Family Medicine

## 2015-01-24 DIAGNOSIS — Z0289 Encounter for other administrative examinations: Secondary | ICD-10-CM

## 2015-01-24 NOTE — Telephone Encounter (Signed)
Do not need to contact her - I suspect she will call when she is ready to re schedule, thanks

## 2015-01-24 NOTE — Telephone Encounter (Signed)
Patient did not come in for their appointment today for cpe .  Please let me know if patient needs to be contacted immediately for follow up or no follow up needed.

## 2015-02-13 ENCOUNTER — Other Ambulatory Visit: Payer: Self-pay | Admitting: Gastroenterology

## 2015-03-05 ENCOUNTER — Other Ambulatory Visit: Payer: Self-pay | Admitting: Family Medicine

## 2015-03-06 NOTE — Telephone Encounter (Signed)
Rx declined pt has cancelled/ no showed multiple appt., she needs to reschedule her CPE, pharmacy advised

## 2015-03-10 ENCOUNTER — Other Ambulatory Visit: Payer: Self-pay | Admitting: Gastroenterology

## 2015-03-10 ENCOUNTER — Other Ambulatory Visit: Payer: Self-pay | Admitting: Family Medicine

## 2015-03-10 NOTE — Telephone Encounter (Signed)
Pt notified of Dr. Marliss Coots instructions and verbalized understanding. Rx declined

## 2015-03-10 NOTE — Telephone Encounter (Signed)
Since she has finished a 6 mo course - she can go ahead and stop it  Can re schedule PE once her GI issues have calmed down Hope she is doing better

## 2015-03-10 NOTE — Telephone Encounter (Signed)
Electronic refill request, Pt no showed her CPE on 01/24/15 (and no showed lab appt too), and no future appt., last refilled on 10/04/14 #30 with 5 additional refill, please advise

## 2015-03-18 ENCOUNTER — Other Ambulatory Visit: Payer: Self-pay | Admitting: Gastroenterology

## 2015-03-20 ENCOUNTER — Telehealth: Payer: Self-pay | Admitting: Gastroenterology

## 2015-03-20 NOTE — Telephone Encounter (Signed)
Sent in one refill for patient, but she must keep scheduled appointment on 7-29 to discuss with Dr Deatra Ina for continued refills

## 2015-03-20 NOTE — Telephone Encounter (Signed)
Patient aware to keep appointment   To discuss further refills  FYI DR Deatra Ina sent in Ambien refill for pt but told her to keep appt on 29th to discuss continuing med

## 2015-03-28 ENCOUNTER — Encounter: Payer: Self-pay | Admitting: Gastroenterology

## 2015-03-28 ENCOUNTER — Ambulatory Visit (INDEPENDENT_AMBULATORY_CARE_PROVIDER_SITE_OTHER): Payer: Commercial Managed Care - HMO | Admitting: Gastroenterology

## 2015-03-28 VITALS — BP 112/82 | HR 72 | Ht 61.75 in | Wt 180.1 lb

## 2015-03-28 DIAGNOSIS — K5289 Other specified noninfective gastroenteritis and colitis: Secondary | ICD-10-CM | POA: Diagnosis not present

## 2015-03-28 DIAGNOSIS — G4700 Insomnia, unspecified: Secondary | ICD-10-CM

## 2015-03-28 DIAGNOSIS — K52832 Lymphocytic colitis: Secondary | ICD-10-CM

## 2015-03-28 MED ORDER — MESALAMINE 1.2 G PO TBEC
4.8000 g | DELAYED_RELEASE_TABLET | Freq: Every day | ORAL | Status: DC
Start: 2015-03-28 — End: 2015-07-08

## 2015-03-28 NOTE — Assessment & Plan Note (Signed)
She has had a partial response to lialda.  Plan to increase to 4.8 g daily.  If, after 10 days, she has not significantly improved will add Entocort.

## 2015-03-28 NOTE — Patient Instructions (Signed)
Call in 10 days to let us know how you are doing Follow up in 6-8 weeks

## 2015-03-28 NOTE — Assessment & Plan Note (Signed)
She is requesting more medication and states that she cannot sleep without this and she is requesting more Xanax..  I will refer her back to Dr. Glori Bickers for management of anxiety and insomnia.

## 2015-03-28 NOTE — Progress Notes (Signed)
      History of Present Illness:  Carol Johnson reports improvement in diarrhea although it remains.  There are days when she has loose stools with urgency.  Other days stools may be soft and decreased in frequency.  She remains on lialda 2.4 g daily.  She complains of severe insomnia.  Ambien was prescribed on July 21.    Review of Systems: Pertinent positive and negative review of systems were noted in the above HPI section. All other review of systems were otherwise negative.    Current Medications, Allergies, Past Medical History, Past Surgical History, Family History and Social History were reviewed in [place] record  Vital signs were reviewed in today's medical record. Physical Exam: General: Well developed , well nourished, no acute distress   See Assessment and Plan under Problem List

## 2015-04-12 ENCOUNTER — Emergency Department (HOSPITAL_COMMUNITY)
Admission: EM | Admit: 2015-04-12 | Discharge: 2015-04-12 | Disposition: A | Discharge status: Home / Self Care | Payer: Commercial Managed Care - HMO | Attending: Emergency Medicine | Admitting: Emergency Medicine

## 2015-04-12 ENCOUNTER — Encounter (HOSPITAL_COMMUNITY): Payer: Self-pay | Admitting: Emergency Medicine

## 2015-04-12 DIAGNOSIS — J45909 Unspecified asthma, uncomplicated: Secondary | ICD-10-CM | POA: Diagnosis not present

## 2015-04-12 DIAGNOSIS — Z79899 Other long term (current) drug therapy: Secondary | ICD-10-CM | POA: Insufficient documentation

## 2015-04-12 DIAGNOSIS — Z86718 Personal history of other venous thrombosis and embolism: Secondary | ICD-10-CM | POA: Diagnosis not present

## 2015-04-12 DIAGNOSIS — F419 Anxiety disorder, unspecified: Secondary | ICD-10-CM | POA: Insufficient documentation

## 2015-04-12 DIAGNOSIS — F329 Major depressive disorder, single episode, unspecified: Secondary | ICD-10-CM | POA: Diagnosis not present

## 2015-04-12 DIAGNOSIS — L03114 Cellulitis of left upper limb: Secondary | ICD-10-CM | POA: Diagnosis not present

## 2015-04-12 DIAGNOSIS — Z720 Tobacco use: Secondary | ICD-10-CM | POA: Insufficient documentation

## 2015-04-12 DIAGNOSIS — M79622 Pain in left upper arm: Secondary | ICD-10-CM | POA: Diagnosis present

## 2015-04-12 DIAGNOSIS — Z8701 Personal history of pneumonia (recurrent): Secondary | ICD-10-CM | POA: Diagnosis not present

## 2015-04-12 DIAGNOSIS — Z86018 Personal history of other benign neoplasm: Secondary | ICD-10-CM | POA: Insufficient documentation

## 2015-04-12 DIAGNOSIS — Z8639 Personal history of other endocrine, nutritional and metabolic disease: Secondary | ICD-10-CM | POA: Insufficient documentation

## 2015-04-12 DIAGNOSIS — K219 Gastro-esophageal reflux disease without esophagitis: Secondary | ICD-10-CM | POA: Diagnosis not present

## 2015-04-12 MED ORDER — CEPHALEXIN 500 MG PO CAPS
500.0000 mg | ORAL_CAPSULE | Freq: Once | ORAL | Status: AC
  Administered 2015-04-12: 500 mg via ORAL
  Filled 2015-04-12: qty 1

## 2015-04-12 MED ORDER — CEPHALEXIN 500 MG PO CAPS: 500.0000 mg | ORAL_CAPSULE | Freq: Two times a day (BID) | ORAL | Status: DC

## 2015-04-12 MED ORDER — DOXYCYCLINE HYCLATE 100 MG PO TABS
100.0000 mg | ORAL_TABLET | Freq: Once | ORAL | Status: AC
  Administered 2015-04-12: 100 mg via ORAL
  Filled 2015-04-12: qty 1

## 2015-04-12 MED ORDER — DOXYCYCLINE HYCLATE 100 MG PO CAPS: 100.0000 mg | ORAL_CAPSULE | Freq: Two times a day (BID) | ORAL | Status: DC

## 2015-04-12 NOTE — ED Notes (Signed)
Pt from home c/o left lower arm pain , redness and warmth. Pt reports she has a HX of DVT and was recently taken off her Xarelto.

## 2015-04-12 NOTE — ED Provider Notes (Signed)
CSN: [id number]   Arrival date & time 04/12/15 0017  History  This chart was scribed for  Carol Balls, MD by Altamease Oiler, ED Scribe. This patient was seen in room WA09/WA09 and the patient's care was started at 2:17 AM.  Chief Complaint  Patient presents with  . Arm Pain    HPI The history is provided by the patient. No language interpreter was used.   Carol Johnson is a 49 y.o. female who presents to the Emergency Department complaining of constant and atraumatic left lower arm pain with gradual onset this afternoon. Pt describes the pain as aching and rates it 7/10 in severity. Associated symptoms include swelling and warmth at the lower left arm. She has history of left leg DVT and stopped Xarelto 1 month ago. Pt denies fever. No IV drug use.   Past Medical History  Diagnosis Date  . Internal hemorrhoid   . GERD (gastroesophageal reflux disease)   . Irritable bowel syndrome   . Fibroid uterus   . Allergic rhinitis   . Hypercholesterolemia   . Asthma   . URI (upper respiratory infection)   . Anxiety   . Depression   . Abdominal pain, unspecified site   . Pneumonia 1998  . Uterine fibroid   . DVT (deep venous thrombosis) 2016    Past Surgical History  Procedure Laterality Date  . Myomectomy    . Uterine fibroid surgery    . Cholecystectomy      laparoscopic "gallstones"  . Cesarean section  2004  . Esophagogastroduodenoscopy (egd) with propofol N/A 05/03/2014    Procedure: ESOPHAGOGASTRODUODENOSCOPY (EGD) WITH PROPOFOL;  Surgeon: Inda Castle, MD;  Location: [facility];  Service: Endoscopy;  Laterality: N/A;    Family History  Problem Relation Age of Onset  . Colon cancer Paternal Aunt   . Uterine cancer Paternal Grandmother   . Heart disease Maternal Grandmother   . Heart disease Paternal Uncle   . Heart disease Paternal Aunt   . Kidney disease Cousin     Social History  Substance Use Topics  . Smoking status: Light Tobacco Smoker  . Smokeless tobacco:  Never Used     Comment: as a teenager-none recent  . Alcohol Use: 6.6 oz/week    1 Standard drinks or equivalent, 10 Cans of beer per week     Comment: occ     Review of Systems 10 Systems reviewed and all are negative for acute change except as noted in the HPI.  Home Medications   Prior to Admission medications   Medication Sig Start Date End Date Taking? Authorizing Provider  amLODipine (NORVASC) 5 MG tablet Take 1 tablet (5 mg total) by mouth daily. 12/20/14  Yes Abner Greenspan, MD  buPROPion (WELLBUTRIN XL) 150 MG 24 hr tablet Take 1 tablet (150 mg total) by mouth daily. 09/02/14  Yes Abner Greenspan, MD  ferrous sulfate 325 (65 FE) MG tablet Take 1 tablet (325 mg total) by mouth 2 (two) times daily with a meal. 01/16/15  Yes Inda Castle, MD  mesalamine (LIALDA) 1.2 G EC tablet Take 4 tablets (4.8 g total) by mouth daily with breakfast. 03/28/15  Yes Inda Castle, MD  pantoprazole (PROTONIX) 40 MG tablet Take 1 tablet (40 mg total) by mouth daily. 09/02/14   Abner Greenspan, MD  zolpidem (AMBIEN CR) 12.5 MG CR tablet TAKE 1 TABLET BY MOUTH AT BEDTIME AS NEEDED FOR SLEEP 03/20/15   Inda Castle, MD  Allergies  Sertraline hcl and Zoloft  Triage Vitals: BP 171/105 mmHg  Pulse 93  Temp(Src) 97.8 F (36.6 C) (Oral)  Resp 22  SpO2 100%  Physical Exam  Constitutional: She is oriented to person, place, and time. She appears well-developed and well-nourished. No distress.  HENT:  Head: Normocephalic and atraumatic.  Nose: Nose normal.  Mouth/Throat: Oropharynx is clear and moist. No oropharyngeal exudate.  Eyes: Conjunctivae and EOM are normal. Pupils are equal, round, and reactive to light. No scleral icterus.  Neck: Normal range of motion. Neck supple. No JVD present. No tracheal deviation present. No thyromegaly present.  Cardiovascular: Normal rate, regular rhythm and normal heart sounds.  Exam reveals no gallop and no friction rub.   No murmur heard. Pulmonary/Chest: Effort  normal and breath sounds normal. No respiratory distress. She has no wheezes. She exhibits no tenderness.  Abdominal: Soft. Bowel sounds are normal. She exhibits no distension and no mass. There is no tenderness. There is no rebound and no guarding.  Musculoskeletal: Normal range of motion. She exhibits tenderness. She exhibits no edema.  Left proximal forearm- 5 cm area of warmth, tenderness, and induration Normal pulses and sensation distally  Lymphadenopathy:    She has no cervical adenopathy.  Neurological: She is alert and oriented to person, place, and time. No cranial nerve deficit. She exhibits normal muscle tone.  Skin: Skin is warm and dry. No rash noted. No erythema. No pallor.  Nursing note and vitals reviewed.   ED Course  Procedures   EMERGENCY DEPARTMENT US SOFT TISSUE INTERPRETATION "Study: Limited Ultrasound of the noted body part in comments below"  INDICATIONS: Soft tissue infection Multiple views of the body part are obtained with a multi-frequency linear probe  PERFORMED BY: Carol Balls, MD  IMAGES ARCHIVED?: Yes  SIDE:Left  BODY PART:Upper extremity  FINDINGS: Cellulitis present  LIMITATIONS: None  INTERPRETATION:  Cellulitis present  COMMENT:  Cellulitis    DIAGNOSTIC STUDIES: Oxygen Saturation is 100% on RA, normal by my interpretation.    COORDINATION OF CARE: 2:21 AM Discussed treatment plan which includes bedside US with pt at bedside and pt agreed to plan.  Labs Review- Labs Reviewed - No data to display  Imaging Review No results found.  EKG Interpretation Normal      MDM   Final diagnoses:  None    Patient presents emergency department for left upper extremity pain, redness, swelling. She is concerned for DVT have her physical exam does not support this. Bedside ultrasound reveals cellulitis. We'll give first dose in a [facility] emergency department and sent home with prescription for 5 days. Keflex and doxycycline. She appears well  in no acute distress. Her vital signs were within her normal limits and she is safe for discharge.  I personally performed the services described in this documentation, which was scribed in my presence. The recorded information has been reviewed and is accurate.    Carol Balls, MD 04/12/15 [phone]

## 2015-04-12 NOTE — Discharge Instructions (Signed)
Cellulitis Carol Johnson, take antibiotics as prescribed for your cellulitis. See primary care physician within 3 days for close follow-up. If symptoms worsen come back to the emergency department immediately. Thank you. Cellulitis is an infection of the skin and the tissue under the skin. The infected area is usually red and tender. This happens most often in the arms and lower legs. HOME CARE   Take your antibiotic medicine as told. Finish the medicine even if you start to feel better.  Keep the infected arm or leg raised (elevated).  Put a warm cloth on the area up to 4 times per day.  Only take medicines as told by your doctor.  Keep all doctor visits as told. GET HELP IF:  You see red streaks on the skin coming from the infected area.  Your red area gets bigger or turns a dark color.  Your bone or joint under the infected area is painful after the skin heals.  Your infection comes back in the same area or different area.  You have a puffy (swollen) bump in the infected area.  You have new symptoms.  You have a fever. GET HELP RIGHT AWAY IF:   You feel very sleepy.  You throw up (vomit) or have watery poop (diarrhea).  You feel sick and have muscle aches and pains. MAKE SURE YOU:   Understand these instructions.  Will watch your condition.  Will get help right away if you are not doing well or get worse. Document Released: 02/02/2008 Document Revised: 12/31/2013 Document Reviewed: 11/01/2011 [facility] Patient Information 2015 [place], [place]. This information is not intended to replace advice given to you by your health care provider. Make sure you discuss any questions you have with your health care provider.

## 2015-04-15 ENCOUNTER — Encounter: Payer: Self-pay | Admitting: Gastroenterology

## 2015-04-16 ENCOUNTER — Ambulatory Visit: Payer: Commercial Managed Care - HMO | Admitting: Family Medicine

## 2015-04-22 ENCOUNTER — Ambulatory Visit (INDEPENDENT_AMBULATORY_CARE_PROVIDER_SITE_OTHER): Payer: Commercial Managed Care - HMO | Admitting: Family Medicine

## 2015-04-22 ENCOUNTER — Encounter: Payer: Self-pay | Admitting: Family Medicine

## 2015-04-22 VITALS — BP 136/88 | HR 68 | Temp 98.0°F | Ht 61.75 in | Wt 179.8 lb

## 2015-04-22 DIAGNOSIS — F329 Major depressive disorder, single episode, unspecified: Secondary | ICD-10-CM | POA: Diagnosis not present

## 2015-04-22 DIAGNOSIS — F32A Depression, unspecified: Secondary | ICD-10-CM

## 2015-04-22 MED ORDER — ESCITALOPRAM OXALATE 10 MG PO TABS: 10.0000 mg | ORAL_TABLET | Freq: Every day | ORAL | Status: DC

## 2015-04-22 MED ORDER — ZOLPIDEM TARTRATE ER 12.5 MG PO TBCR: 12.5000 mg | EXTENDED_RELEASE_TABLET | Freq: Every evening | ORAL | Status: DC | PRN

## 2015-04-22 NOTE — Patient Instructions (Signed)
Continue wellbutrin  Add lexapro 10 mg once daily  If intolerable side effects or worse depression or anxiety - stop it and let me know   Stop at check out for referral to counselor   Follow up with me in about 6 weeks

## 2015-04-22 NOTE — Assessment & Plan Note (Signed)
Worse depression with some anxiety symptoms - after inc in stressors  Reviewed stressors/ coping techniques/symptoms/ support sources/ tx options and side effects in detail today  In the past -inc of wellbutrin to 300 did not help  Will remain on 150 zoloft caused diarrhea however this may have actually been from her UC not yet diagnosed Trial of lexapro Discussed expectations of SSRI medication including time to effectiveness and mechanism of action, also poss of side effects (early and late)- including mental fuzziness, weight or appetite change, nausea and poss of worse dep or anxiety (even suicidal thoughts)  Pt voiced understanding and will stop med and update if this occurs    Ref for counseling - hopefully family will help with the expense  >25 minutes spent in face to face time with patient, >50% spent in counselling or coordination of care

## 2015-04-22 NOTE — Progress Notes (Signed)
Subjective:    Patient ID: Carol Johnson, female    DOB: 1966-04-21, 49 y.o.   MRN: [id number]  HPI Here for a visit for depression   Having more days when she "cannot stop crying"  Is difficult- at work or in front of her son  One night - "kinda had a little meltdown"- very overwhelmed   (a lot of hassles that day that built up)  Felt like she needed to be with people - spent the night with her sister and that really helped (talking)   Has an ex - changes her schedule all the time/ very stressful (also dodging child support) Child is 20 - difficult  Has a boyfriend issue - he is being a "jerk" - 2 1/2 years on and off - hard time letting go  Also deals with health issues - dx with UC (improved but still not back to normal)  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 Lymphocytic colitis 01/16/2015  . Anemia, iron deficiency 01/16/2015  . Right leg pain 12/30/2014  . Anxiety state 12/20/2014  . Diarrhea 11/27/2014  . Insomnia 11/27/2014  . Sore throat 09/17/2014  . Left leg DVT 09/17/2014  . Sleep disorder 09/02/2014  . Gastritis, chronic 06/06/2014  . Unspecified gastritis and gastroduodenitis without mention of hemorrhage 05/03/2014  . Abdominal pain, epigastric 04/19/2014  . Internal hemorrhoids with other complication [id number]  . Obese 06/27/2013  . Routine general medical examination at a health care facility 10/14/2011  . Thyroid nodule 05/16/2011  . Rectal bleeding 11/24/2010  .  Hemorrhoids, internal 11/24/2010  . Irritable bowel syndrome (IBS) 11/24/2010  . ANEMIA 10/02/2009  . HYPERLIPIDEMIA 07/18/2009  . Essential hypertension 07/18/2009  . DYSPNEA ON EXERTION 07/18/2009  . THYROID CYST 01/31/2008  . ANXIETY 01/31/2008  . Depression 01/31/2008  . GASTRIC ULCER, HX OF 01/31/2008  . FIBROIDS, UTERUS 11/13/2007  . ALLERGIC RHINITIS 11/13/2007  . ASTHMA 11/13/2007  . GERD 11/13/2007  . IRRITABLE BOWEL SYNDROME 11/13/2007  . INTERNAL HEMORRHOIDS 11/12/2003   Past Medical History  Diagnosis Date  . Internal hemorrhoid   . GERD (gastroesophageal reflux disease)   . Irritable bowel syndrome   . Fibroid uterus   . Allergic rhinitis   . Hypercholesterolemia   . Asthma   . URI (upper respiratory infection)   . Anxiety   . Depression   . Abdominal pain, unspecified site   . Pneumonia 1998  . Uterine fibroid   . DVT (deep venous thrombosis) 2016   Past Surgical History  Procedure Laterality Date  . Myomectomy    . Uterine fibroid surgery    . Cholecystectomy      laparoscopic "gallstones"  . Cesarean section  2004  . Esophagogastroduodenoscopy (egd) with propofol N/A 05/03/2014    Procedure: ESOPHAGOGASTRODUODENOSCOPY (EGD) WITH PROPOFOL;  Surgeon: Inda Castle, MD;  Location: [facility];  Service: Endoscopy;  Laterality: N/A;   Social History  Substance Use Topics  . Smoking status: Light Tobacco Smoker  . Smokeless tobacco: Never Used     Comment: as a teenager-none recent  . Alcohol Use: 6.6 oz/week    1 Standard drinks or equivalent, 10 Cans of beer per week     Comment: occ   Family History  Problem Relation Age of Onset  . Colon cancer Paternal Aunt   . Uterine cancer Paternal Grandmother   . Heart disease Maternal Grandmother   . Heart disease Paternal Uncle   . Heart disease Paternal Aunt   . Kidney disease Cousin    Allergies  Allergen Reactions  . Sertraline Hcl     REACTION: diarrhea  . Zoloft [Sertraline Hcl] Diarrhea    Current Outpatient Prescriptions on File Prior to Visit  Medication Sig Dispense Refill  . amLODipine (NORVASC) 5 MG tablet Take 1 tablet (5 mg total) by mouth daily. 30 tablet 5  . buPROPion (WELLBUTRIN XL) 150 MG 24 hr tablet Take 1 tablet (150 mg total) by mouth daily. 30 tablet 11  . ferrous sulfate 325 (65 FE) MG tablet Take 1 tablet (325 mg total) by mouth 2 (two) times daily with a meal. 60 tablet 3  . mesalamine (LIALDA) 1.2 G EC tablet Take 4 tablets (4.8 g total) by mouth daily with breakfast. (Patient taking differently: Take 2.4 g by mouth daily with breakfast. ) 60 tablet 1  . pantoprazole (PROTONIX) 40 MG tablet Take 1 tablet (40 mg total) by mouth daily. 30 tablet 11  . zolpidem (AMBIEN CR) 12.5 MG CR tablet TAKE 1 TABLET BY MOUTH AT BEDTIME AS NEEDED FOR SLEEP 30 tablet 0   No current facility-administered medications on file prior to visit.      Review of Systems Review of Systems  Constitutional: Negative for fever, appetite change, fatigue and unexpected weight change.  Eyes: Negative for pain and visual disturbance.  Respiratory: Negative for cough and shortness of breath.   Cardiovascular: Negative for cp or palpitations    Gastrointestinal: Negative for nausea, diarrhea and constipation.  Genitourinary: Negative for urgency and frequency.  Skin: Negative for pallor or rash   Neurological: Negative for weakness, light-headedness, numbness and headaches.  Hematological: Negative for adenopathy. Does not bruise/bleed easily.  Psychiatric/Behavioral: pos for anx and depression symptoms with feeling of being overwhelmed, neg for SI, pos for trouble concentrating          Objective:   Physical Exam  Constitutional: She appears well-developed and well-nourished. No distress.  obese and well appearing   HENT:  Head: Normocephalic and atraumatic.  Mouth/Throat: Oropharynx is clear and moist.  Eyes: Conjunctivae and EOM are normal. Pupils are equal, round, and  reactive to light.  Neck: Normal range of motion. Neck supple. No thyromegaly present.  Cardiovascular: Normal rate and regular rhythm.   Pulmonary/Chest: Effort normal and breath sounds normal. No respiratory distress. She has no wheezes.  Lymphadenopathy:    She has no cervical adenopathy.  Neurological: She is alert. She has normal reflexes. She displays no tremor. No cranial nerve deficit. She exhibits normal muscle tone. Coordination normal.  Skin: Skin is warm and dry. No rash noted. No erythema. No pallor.  Psychiatric: Her speech is normal and behavior is normal. Thought content normal. Her mood appears anxious. Her affect is not angry, not blunt, not labile and not inappropriate. Thought content is not paranoid. Cognition and memory are normal. She exhibits a depressed  mood. She expresses no homicidal and no suicidal ideation.  Anxious and depressed  Tearful at times  Attentive and talkative/eager to discuss stressors           Assessment & Plan:   Problem List Items Addressed This Visit    Depression - Primary    Worse depression with some anxiety symptoms - after inc in stressors  Reviewed stressors/ coping techniques/symptoms/ support sources/ tx options and side effects in detail today  In the past -inc of wellbutrin to 300 did not help  Will remain on 150 zoloft caused diarrhea however this may have actually been from her UC not yet diagnosed Trial of lexapro Discussed expectations of SSRI medication including time to effectiveness and mechanism of action, also poss of side effects (early and late)- including mental fuzziness, weight or appetite change, nausea and poss of worse dep or anxiety (even suicidal thoughts)  Pt voiced understanding and will stop med and update if this occurs    Ref for counseling - hopefully family will help with the expense  >25 minutes spent in face to face time with patient, >50% spent in counselling or coordination of care         Relevant Medications   escitalopram (LEXAPRO) 10 MG tablet   Other Relevant Orders   Ambulatory referral to Psychology

## 2015-04-22 NOTE — Progress Notes (Signed)
Pre visit review using our clinic review tool, if applicable. No additional management support is needed unless otherwise documented below in the visit note. 

## 2015-05-07 ENCOUNTER — Ambulatory Visit (INDEPENDENT_AMBULATORY_CARE_PROVIDER_SITE_OTHER): Payer: 59 | Admitting: Psychology

## 2015-05-07 DIAGNOSIS — F4323 Adjustment disorder with mixed anxiety and depressed mood: Secondary | ICD-10-CM

## 2015-05-15 ENCOUNTER — Ambulatory Visit (INDEPENDENT_AMBULATORY_CARE_PROVIDER_SITE_OTHER): Payer: 59 | Admitting: Psychology

## 2015-05-15 DIAGNOSIS — F4323 Adjustment disorder with mixed anxiety and depressed mood: Secondary | ICD-10-CM

## 2015-05-23 ENCOUNTER — Ambulatory Visit (INDEPENDENT_AMBULATORY_CARE_PROVIDER_SITE_OTHER): Payer: 59 | Admitting: Psychology

## 2015-05-23 DIAGNOSIS — F4323 Adjustment disorder with mixed anxiety and depressed mood: Secondary | ICD-10-CM

## 2015-06-04 ENCOUNTER — Ambulatory Visit: Payer: 59 | Admitting: Psychology

## 2015-06-06 ENCOUNTER — Ambulatory Visit (INDEPENDENT_AMBULATORY_CARE_PROVIDER_SITE_OTHER): Payer: Commercial Managed Care - HMO | Admitting: Family Medicine

## 2015-06-06 DIAGNOSIS — Z0289 Encounter for other administrative examinations: Secondary | ICD-10-CM

## 2015-06-06 NOTE — Progress Notes (Signed)
   Subjective:    Patient ID: Carol Johnson, female    DOB: [birth year], 49 y.o.   MRN: [id number]  HPI  No show    Review of Systems     Objective:   Physical Exam        Assessment & Plan:

## 2015-06-13 ENCOUNTER — Ambulatory Visit: Payer: 59 | Admitting: Psychology

## 2015-06-15 ENCOUNTER — Other Ambulatory Visit: Payer: Self-pay | Admitting: Gastroenterology

## 2015-06-16 ENCOUNTER — Ambulatory Visit (INDEPENDENT_AMBULATORY_CARE_PROVIDER_SITE_OTHER): Payer: 59 | Admitting: Psychology

## 2015-06-16 DIAGNOSIS — F4323 Adjustment disorder with mixed anxiety and depressed mood: Secondary | ICD-10-CM

## 2015-06-23 ENCOUNTER — Ambulatory Visit (INDEPENDENT_AMBULATORY_CARE_PROVIDER_SITE_OTHER): Payer: 59 | Admitting: Psychology

## 2015-06-23 DIAGNOSIS — F4323 Adjustment disorder with mixed anxiety and depressed mood: Secondary | ICD-10-CM | POA: Diagnosis not present

## 2015-06-30 ENCOUNTER — Ambulatory Visit (INDEPENDENT_AMBULATORY_CARE_PROVIDER_SITE_OTHER): Payer: 59 | Admitting: Psychology

## 2015-06-30 DIAGNOSIS — F4323 Adjustment disorder with mixed anxiety and depressed mood: Secondary | ICD-10-CM | POA: Diagnosis not present

## 2015-07-01 ENCOUNTER — Other Ambulatory Visit: Payer: Self-pay | Admitting: Gastroenterology

## 2015-07-01 ENCOUNTER — Telehealth: Payer: Self-pay | Admitting: Gastroenterology

## 2015-07-01 ENCOUNTER — Other Ambulatory Visit: Payer: Self-pay

## 2015-07-01 MED ORDER — BUDESONIDE 3 MG PO CPEP: 9.0000 mg | ORAL_CAPSULE | Freq: Every day | ORAL | Status: DC

## 2015-07-01 NOTE — Telephone Encounter (Signed)
Prescribed Entocort EC 3 mg capsules take 3 each day #90 with 2 refills She should see some sort of response within a week or so she can take Imodium also as needed She will need to be seen by a provider in 1-2 months, she should call back if she is not getting a response to the Entocort within one to 2 weeks

## 2015-07-01 NOTE — Telephone Encounter (Signed)
Doc of the Day Former [facility] patient with lymphocytic colitis who has been responsive to Lialda 4.8 mg daily. She calls with symptoms the past 3-4 days of "explosive" gas and stool urgency.Stools much more loose than her normal and she cannot pass gas with confidence.Her abdomen feels sore and achy. She had blood in her stool today. Please advise.

## 2015-07-01 NOTE — Telephone Encounter (Signed)
Patient is instructed and agrees to this plan of care. She will come in to see Dr Silverio Decamp 09/04/15 at [facility]

## 2015-07-08 ENCOUNTER — Telehealth: Payer: Self-pay

## 2015-07-08 MED ORDER — MESALAMINE 1.2 G PO TBEC: 4.8000 g | DELAYED_RELEASE_TABLET | Freq: Every day | ORAL | Status: DC

## 2015-07-08 NOTE — Telephone Encounter (Signed)
Lialda refilled with one refill

## 2015-07-08 NOTE — Telephone Encounter (Signed)
Incoming fax for refills on Lialda 1.2grams 4 tabs a day. Pt is a former Dr Deatra Ina pt. She is scheduled for a follow up with you on 09-04-2015. Can we fill meds until follow up? Please advise.

## 2015-07-08 NOTE — Telephone Encounter (Signed)
Ok to refill until office visit appt. Thanks

## 2015-07-09 ENCOUNTER — Ambulatory Visit: Payer: 59 | Admitting: Psychology

## 2015-07-13 ENCOUNTER — Other Ambulatory Visit: Payer: Self-pay | Admitting: Family Medicine

## 2015-07-14 NOTE — Telephone Encounter (Signed)
Electronic refill request, pt has no showed a few appt including her last f/u, please advise

## 2015-07-14 NOTE — Telephone Encounter (Signed)
Please schedule f/u and refill until then  Let her know if she no shows I cannot refill further - make sure she has contact info so she can tell us if she has to cancel/etc  Thanks  I do not want her to have to be w/o med

## 2015-07-15 NOTE — Telephone Encounter (Signed)
Called pt and no answer and voicemail box was full so couldn't leave message

## 2015-07-16 NOTE — Telephone Encounter (Signed)
Pt called and scheduled follow up for Friday, 07/18/2015.  She says she will check to see if she has enough meds until then and if not she will call back. Thank you.

## 2015-07-17 ENCOUNTER — Ambulatory Visit: Payer: 59 | Admitting: Psychology

## 2015-07-18 ENCOUNTER — Encounter: Payer: Self-pay | Admitting: Family Medicine

## 2015-07-18 ENCOUNTER — Ambulatory Visit (INDEPENDENT_AMBULATORY_CARE_PROVIDER_SITE_OTHER): Payer: Commercial Managed Care - HMO | Admitting: Family Medicine

## 2015-07-18 VITALS — BP 144/86 | HR 76 | Temp 97.5°F | Wt 182.8 lb

## 2015-07-18 DIAGNOSIS — I10 Essential (primary) hypertension: Secondary | ICD-10-CM

## 2015-07-18 DIAGNOSIS — F329 Major depressive disorder, single episode, unspecified: Secondary | ICD-10-CM

## 2015-07-18 DIAGNOSIS — F32A Depression, unspecified: Secondary | ICD-10-CM

## 2015-07-18 LAB — LIPID PANEL
CHOL/HDL RATIO: 5
Cholesterol: 214 mg/dL — ABNORMAL HIGH (ref 0–200)
HDL: 43 mg/dL (ref >39.00)
LDL Cholesterol: 155 mg/dL — ABNORMAL HIGH (ref 0–99)
NONHDL: 170.56
TRIGLYCERIDES: 77 mg/dL (ref 0.0–149.0)
VLDL: 15.4 mg/dL (ref 0.0–40.0)

## 2015-07-18 LAB — TSH: TSH: 1.05 u[IU]/mL (ref 0.35–4.50)

## 2015-07-18 MED ORDER — ESCITALOPRAM OXALATE 10 MG PO TABS: 10.0000 mg | ORAL_TABLET | Freq: Every day | ORAL | Status: DC

## 2015-07-18 MED ORDER — PANTOPRAZOLE SODIUM 40 MG PO TBEC: 40.0000 mg | DELAYED_RELEASE_TABLET | Freq: Every day | ORAL | Status: DC

## 2015-07-18 MED ORDER — ZOLPIDEM TARTRATE ER 12.5 MG PO TBCR: 12.5000 mg | EXTENDED_RELEASE_TABLET | Freq: Every evening | ORAL | Status: DC | PRN

## 2015-07-18 MED ORDER — AMLODIPINE BESYLATE 5 MG PO TABS: 5.0000 mg | ORAL_TABLET | Freq: Every day | ORAL | Status: DC

## 2015-07-18 MED ORDER — BUPROPION HCL ER (XL) 150 MG PO TB24: 150.0000 mg | ORAL_TABLET | Freq: Every day | ORAL | Status: DC

## 2015-07-18 NOTE — Progress Notes (Signed)
Subjective:    Patient ID: Carol Johnson, female    DOB: 1965/10/21, 49 y.o.   MRN: [id number]  HPI Here for f/u of chornic medical problems   Mood - is up and down  Stressors - issues with a boyfriend / cheating on her  Nasty break up  Not been a good week - feeling pretty humiliated right now  She is in counseling once per week - it is helpful (cannot afford more than that) Feels panicky at time   On wellbutrin and lexapro now  Wants to stay on those- lexapro in addition to that   Using [place] for sleep  Eating and drinking fluids  Not a lot of time for exercise    Wt is up 3 lb with bmi of 33  Patient Active Problem List   Diagnosis Date Noted  . Lymphocytic colitis 01/16/2015  . Anemia, iron deficiency 01/16/2015  . Right leg pain 12/30/2014  . Anxiety state 12/20/2014  . Diarrhea 11/27/2014  . Insomnia 11/27/2014  . Sore throat 09/17/2014  . Left leg DVT ([place]) 09/17/2014  . Sleep disorder 09/02/2014  . Gastritis, chronic 06/06/2014  . Unspecified gastritis and gastroduodenitis without mention of hemorrhage 05/03/2014  . Abdominal pain, epigastric 04/19/2014  . Internal hemorrhoids with other complication 06/15/5101  . Obese 06/27/2013  . Routine general medical examination at a health care facility 10/14/2011  . Thyroid nodule 05/16/2011  . Rectal bleeding 11/24/2010  . Hemorrhoids, internal 11/24/2010  . Irritable bowel syndrome (IBS) 11/24/2010  . ANEMIA 10/02/2009  . Hyperlipidemia 07/18/2009  . Essential hypertension 07/18/2009  . DYSPNEA ON EXERTION 07/18/2009  . THYROID CYST 01/31/2008  . ANXIETY 01/31/2008  . Depression 01/31/2008  . GASTRIC ULCER, HX OF 01/31/2008  . FIBROIDS, UTERUS 11/13/2007  . ALLERGIC RHINITIS 11/13/2007  . ASTHMA 11/13/2007  . GERD 11/13/2007  . IRRITABLE BOWEL SYNDROME 11/13/2007  . INTERNAL HEMORRHOIDS 11/12/2003   Past Medical History  Diagnosis Date  . Internal hemorrhoid   . GERD (gastroesophageal reflux  disease)   . Irritable bowel syndrome   . Fibroid uterus   . Allergic rhinitis   . Hypercholesterolemia   . Asthma   . URI (upper respiratory infection)   . Anxiety   . Depression   . Abdominal pain, unspecified site   . Pneumonia 1998  . Uterine fibroid   . DVT (deep venous thrombosis) ([place]) 2016   Past Surgical History  Procedure Laterality Date  . Myomectomy    . Uterine fibroid surgery    . Cholecystectomy      laparoscopic "gallstones"  . Cesarean section  2004  . Esophagogastroduodenoscopy (egd) with propofol N/A 05/03/2014    Procedure: ESOPHAGOGASTRODUODENOSCOPY (EGD) WITH PROPOFOL;  Surgeon: Inda Castle, MD;  Location: [facility];  Service: Endoscopy;  Laterality: N/A;   Social History  Substance Use Topics  . Smoking status: Light Tobacco Smoker  . Smokeless tobacco: Never Used     Comment: as a teenager-none recent  . Alcohol Use: 6.6 oz/week    1 Standard drinks or equivalent, 10 Cans of beer per week     Comment: occ   Family History  Problem Relation Age of Onset  . Colon cancer Paternal Aunt   . Uterine cancer Paternal Grandmother   . Heart disease Maternal Grandmother   . Heart disease Paternal Uncle   . Heart disease Paternal Aunt   . Kidney disease Cousin    Allergies  Allergen Reactions  . Sertraline Hcl  REACTION: diarrhea  . Zoloft [Sertraline Hcl] Diarrhea   Current Outpatient Prescriptions on File Prior to Visit  Medication Sig Dispense Refill  . amLODipine (NORVASC) 5 MG tablet Take 1 tablet (5 mg total) by mouth daily. 30 tablet 5  . budesonide (ENTOCORT EC) 3 MG 24 hr capsule Take 3 capsules (9 mg total) by mouth daily. 90 capsule 2  . buPROPion (WELLBUTRIN XL) 150 MG 24 hr tablet Take 1 tablet (150 mg total) by mouth daily. 30 tablet 11  . escitalopram (LEXAPRO) 10 MG tablet Take 1 tablet (10 mg total) by mouth daily. 30 tablet 11  . ferrous sulfate 325 (65 FE) MG tablet Take 1 tablet (325 mg total) by mouth 2 (two) times daily  with a meal. 60 tablet 3  . mesalamine (LIALDA) 1.2 G EC tablet Take 4 tablets (4.8 g total) by mouth daily with breakfast. 120 tablet 1  . pantoprazole (PROTONIX) 40 MG tablet Take 1 tablet (40 mg total) by mouth daily. 30 tablet 11  . zolpidem (AMBIEN CR) 12.5 MG CR tablet Take 1 tablet (12.5 mg total) by mouth at bedtime as needed. for sleep 30 tablet 3   No current facility-administered medications on file prior to visit.     bp in fair control at this time  BP Readings from Last 1 Encounters:  07/18/15 144/86   bp is stable today - has not taken amlodipine today  No cp or palpitations or headaches or edema  No side effects to medicines  BP Readings from Last 3 Encounters:  07/18/15 144/86  04/22/15 136/88  04/12/15 157/96      Patient Active Problem List   Diagnosis Date Noted  . Lymphocytic colitis 01/16/2015  . Anemia, iron deficiency 01/16/2015  . Right leg pain 12/30/2014  . Anxiety state 12/20/2014  . Diarrhea 11/27/2014  . Insomnia 11/27/2014  . Sore throat 09/17/2014  . Left leg DVT ([place]) 09/17/2014  . Sleep disorder 09/02/2014  . Gastritis, chronic 06/06/2014  . Unspecified gastritis and gastroduodenitis without mention of hemorrhage 05/03/2014  . Abdominal pain, epigastric 04/19/2014  . Internal hemorrhoids with other complication 41/28/7867  . Obese 06/27/2013  . Routine general medical examination at a health care facility 10/14/2011  . Thyroid nodule 05/16/2011  . Rectal bleeding 11/24/2010  . Hemorrhoids, internal 11/24/2010  . Irritable bowel syndrome (IBS) 11/24/2010  . ANEMIA 10/02/2009  . Hyperlipidemia 07/18/2009  . Essential hypertension 07/18/2009  . DYSPNEA ON EXERTION 07/18/2009  . THYROID CYST 01/31/2008  . ANXIETY 01/31/2008  . Depression 01/31/2008  . GASTRIC ULCER, HX OF 01/31/2008  . FIBROIDS, UTERUS 11/13/2007  . ALLERGIC RHINITIS 11/13/2007  . ASTHMA 11/13/2007  . GERD 11/13/2007  . IRRITABLE BOWEL SYNDROME 11/13/2007  .  INTERNAL HEMORRHOIDS 11/12/2003   Past Medical History  Diagnosis Date  . Internal hemorrhoid   . GERD (gastroesophageal reflux disease)   . Irritable bowel syndrome   . Fibroid uterus   . Allergic rhinitis   . Hypercholesterolemia   . Asthma   . URI (upper respiratory infection)   . Anxiety   . Depression   . Abdominal pain, unspecified site   . Pneumonia 1998  . Uterine fibroid   . DVT (deep venous thrombosis) ([place]) 2016   Past Surgical History  Procedure Laterality Date  . Myomectomy    . Uterine fibroid surgery    . Cholecystectomy      laparoscopic "gallstones"  . Cesarean section  2004  . Esophagogastroduodenoscopy (egd) with propofol N/A  05/03/2014    Procedure: ESOPHAGOGASTRODUODENOSCOPY (EGD) WITH PROPOFOL;  Surgeon: Inda Castle, MD;  Location: [facility];  Service: Endoscopy;  Laterality: N/A;   Social History  Substance Use Topics  . Smoking status: Light Tobacco Smoker  . Smokeless tobacco: Never Used     Comment: as a teenager-none recent  . Alcohol Use: 6.6 oz/week    1 Standard drinks or equivalent, 10 Cans of beer per week     Comment: occ   Family History  Problem Relation Age of Onset  . Colon cancer Paternal Aunt   . Uterine cancer Paternal Grandmother   . Heart disease Maternal Grandmother   . Heart disease Paternal Uncle   . Heart disease Paternal Aunt   . Kidney disease Cousin    Allergies  Allergen Reactions  . Sertraline Hcl     REACTION: diarrhea  . Zoloft [Sertraline Hcl] Diarrhea   Current Outpatient Prescriptions on File Prior to Visit  Medication Sig Dispense Refill  . budesonide (ENTOCORT EC) 3 MG 24 hr capsule Take 3 capsules (9 mg total) by mouth daily. 90 capsule 2  . ferrous sulfate 325 (65 FE) MG tablet Take 1 tablet (325 mg total) by mouth 2 (two) times daily with a meal. 60 tablet 3  . mesalamine (LIALDA) 1.2 G EC tablet Take 4 tablets (4.8 g total) by mouth daily with breakfast. 120 tablet 1   No current  facility-administered medications on file prior to visit.    Review of Systems Review of Systems  Constitutional: Negative for fever, appetite change,  and unexpected weight change.  Eyes: Negative for pain and visual disturbance.  Respiratory: Negative for cough and shortness of breath.   Cardiovascular: Negative for cp or palpitations    Gastrointestinal: Negative for nausea, diarrhea and constipation.  Genitourinary: Negative for urgency and frequency.  Skin: Negative for pallor or rash   Neurological: Negative for weakness, light-headedness, numbness and headaches.  Hematological: Negative for adenopathy. Does not bruise/bleed easily.  Psychiatric/Behavioral: pos for dep/anx symptoms that are improved with medication as well as sleep problems pos for recent stressors, neg for SI       Objective:   Physical Exam  Constitutional: She appears well-developed and well-nourished. No distress.  obese and well appearing   HENT:  Head: Normocephalic and atraumatic.  Mouth/Throat: Oropharynx is clear and moist.  Eyes: Conjunctivae and EOM are normal. Pupils are equal, round, and reactive to light.  Neck: Normal range of motion. Neck supple. No JVD present. Carotid bruit is not present. No thyromegaly present.  Cardiovascular: Normal rate, regular rhythm, normal heart sounds and intact distal pulses.  Exam reveals no gallop.   Pulmonary/Chest: Effort normal and breath sounds normal. No respiratory distress. She has no wheezes. She has no rales.  No crackles  Abdominal: Soft. Bowel sounds are normal. She exhibits no distension, no abdominal bruit and no mass. There is no tenderness.  Musculoskeletal: She exhibits no edema.  Lymphadenopathy:    She has no cervical adenopathy.  Neurological: She is alert. She has normal reflexes.  Skin: Skin is warm and dry. No rash noted.  Psychiatric: Her speech is normal. Thought content normal. Her mood appears not anxious. Her affect is not blunt and  not labile. Thought content is not paranoid. Cognition and memory are normal. She exhibits a depressed mood. She expresses no homicidal and no suicidal ideation.  Attentive and pleasant  Tearful at times when discussing recent breakup  Assessment & Plan:   Problem List Items Addressed This Visit      Cardiovascular and Mediastinum   Essential hypertension - Primary    bp in fair control at this time (pt is upset today as well) BP Readings from Last 1 Encounters:  07/18/15 144/86   No changes needed Disc lifstyle change with low sodium diet and exercise  Labs today       Relevant Medications   amLODipine (NORVASC) 5 MG tablet   Other Relevant Orders   Lipid panel (Completed)   TSH (Completed)     Other   Depression    Ongoing - improved with addition of lexapro to wellbutrin overall - but in crisis over a recent breakup  Reviewed stressors/ coping techniques/symptoms/ support sources/ tx options and side effects in detail today  Doing better than expected Will continue counseling and current medicines  Update if symptoms or dep or anx worsen  Continue ambien prn for sleep with caution      Relevant Medications   escitalopram (LEXAPRO) 10 MG tablet   buPROPion (WELLBUTRIN XL) 150 MG 24 hr tablet

## 2015-07-18 NOTE — Progress Notes (Signed)
Pre visit review using our clinic review tool, if applicable. No additional management support is needed unless otherwise documented below in the visit note. 

## 2015-07-18 NOTE — Patient Instructions (Signed)
Get back on your regular medicines Try to get some regular exercise  Labs today  Keep going to counseling

## 2015-07-20 NOTE — Assessment & Plan Note (Signed)
bp in fair control at this time (pt is upset today as well) BP Readings from Last 1 Encounters:  07/18/15 144/86   No changes needed Disc lifstyle change with low sodium diet and exercise  Labs today

## 2015-07-20 NOTE — Assessment & Plan Note (Signed)
Ongoing - improved with addition of lexapro to wellbutrin overall - but in crisis over a recent breakup  Reviewed stressors/ coping techniques/symptoms/ support sources/ tx options and side effects in detail today  Doing better than expected Will continue counseling and current medicines  Update if symptoms or dep or anx worsen  Continue ambien prn for sleep with caution

## 2015-07-23 ENCOUNTER — Ambulatory Visit (INDEPENDENT_AMBULATORY_CARE_PROVIDER_SITE_OTHER): Payer: 59 | Admitting: Psychology

## 2015-07-23 DIAGNOSIS — F4323 Adjustment disorder with mixed anxiety and depressed mood: Secondary | ICD-10-CM

## 2015-08-06 ENCOUNTER — Ambulatory Visit: Payer: 59 | Admitting: Psychology

## 2015-08-13 ENCOUNTER — Ambulatory Visit (INDEPENDENT_AMBULATORY_CARE_PROVIDER_SITE_OTHER): Payer: 59 | Admitting: Psychology

## 2015-08-13 DIAGNOSIS — F4323 Adjustment disorder with mixed anxiety and depressed mood: Secondary | ICD-10-CM

## 2015-08-19 ENCOUNTER — Other Ambulatory Visit: Payer: Self-pay | Admitting: Family Medicine

## 2015-09-03 ENCOUNTER — Ambulatory Visit (INDEPENDENT_AMBULATORY_CARE_PROVIDER_SITE_OTHER): Payer: Managed Care, Other (non HMO) | Admitting: Psychology

## 2015-09-03 DIAGNOSIS — F4323 Adjustment disorder with mixed anxiety and depressed mood: Secondary | ICD-10-CM

## 2015-09-04 ENCOUNTER — Ambulatory Visit: Payer: Self-pay | Admitting: Gastroenterology

## 2015-09-08 ENCOUNTER — Other Ambulatory Visit: Payer: Self-pay | Admitting: Gastroenterology

## 2015-09-17 ENCOUNTER — Ambulatory Visit (INDEPENDENT_AMBULATORY_CARE_PROVIDER_SITE_OTHER): Payer: Managed Care, Other (non HMO) | Admitting: Psychology

## 2015-09-17 DIAGNOSIS — F4323 Adjustment disorder with mixed anxiety and depressed mood: Secondary | ICD-10-CM

## 2015-10-01 ENCOUNTER — Ambulatory Visit (INDEPENDENT_AMBULATORY_CARE_PROVIDER_SITE_OTHER): Payer: Managed Care, Other (non HMO) | Admitting: Psychology

## 2015-10-01 DIAGNOSIS — F4323 Adjustment disorder with mixed anxiety and depressed mood: Secondary | ICD-10-CM

## 2015-10-12 ENCOUNTER — Other Ambulatory Visit: Payer: Self-pay | Admitting: Internal Medicine

## 2015-10-17 ENCOUNTER — Other Ambulatory Visit: Payer: Self-pay

## 2015-10-17 ENCOUNTER — Encounter: Payer: Self-pay | Admitting: Gastroenterology

## 2015-10-17 MED ORDER — MESALAMINE 1.2 G PO TBEC: DELAYED_RELEASE_TABLET | ORAL | Status: DC

## 2015-10-17 MED ORDER — BUDESONIDE 3 MG PO CPEP
9.0000 mg | ORAL_CAPSULE | Freq: Every day | ORAL | Status: DC
Start: 2015-10-17 — End: 2016-01-27

## 2015-10-22 ENCOUNTER — Ambulatory Visit (INDEPENDENT_AMBULATORY_CARE_PROVIDER_SITE_OTHER): Payer: Managed Care, Other (non HMO) | Admitting: Psychology

## 2015-10-22 DIAGNOSIS — F4323 Adjustment disorder with mixed anxiety and depressed mood: Secondary | ICD-10-CM

## 2015-11-03 ENCOUNTER — Telehealth: Payer: Self-pay

## 2015-11-03 MED ORDER — OSELTAMIVIR PHOSPHATE 75 MG PO CAPS: 75.0000 mg | ORAL_CAPSULE | Freq: Every day | ORAL | Status: DC

## 2015-11-03 NOTE — Telephone Encounter (Signed)
Pt advise me that she is still having chest soreness, HA/sinus pressure, lingering cough that's sometimes productive, pt said she is coughing up white phlegm no no fever she is aware of. Pt's son was diagnosed with flu (type A) over weekend and due to pt's chronic illness (UC) and that [facility] and had a chest xray and was diagnosed with pneumonia pt wasn't sure if she should have another chest xray or if she should start tamiflu or what she should do, pt isn't feeling well  Pharmacy [facility] [place]

## 2015-11-03 NOTE — Telephone Encounter (Signed)
PLEASE NOTE: All timestamps contained within this report are represented as [place] Standard Time. CONFIDENTIALTY NOTICE: This fax transmission is intended only for the addressee. It contains information that is legally privileged, confidential or otherwise protected from use or disclosure. If you are not the intended recipient, you are strictly prohibited from reviewing, disclosing, copying using or disseminating any of this information or taking any action in reliance on or regarding this information. If you have received this fax in error, please notify us immediately by telephone so that we can arrange for its return to [place]. Phone: [phone], Toll-Free: [phone], Fax: [phone] Page: 1 of 2 Call Id: [id number] [place] Patient Name: Carol Johnson Gender: Female DOB: 03-02-1966 Age: 50 Y 24 D Return Phone Number: [phone] (Primary) Address: [place]/State/Zip: [place] [address] Client [facility] Night - Client Client Site [facility], [place] Contact Type Call Who Is Calling Patient / Member / Family / Caregiver Call Type Triage / Clinical Relationship To Patient Self Return Phone Number [phone] (Primary) Chief Complaint Sore Throat Reason for Call Symptomatic / Request for Health Information Initial Comment Caller states stuffy nose, small sore throat. She was told to call Dr today to see about getting a rx to avoid getting the flu from her son who was diagnosed yesterday. PreDisposition Call Doctor Translation No Nurse Assessment Nurse: Lillia Corporal, RN, Lenell Antu Date/Time [name] Time): 11/01/2015 9:05:24 AM Confirm and document reason for call. If symptomatic, describe symptoms. You must click the next button to save text entered. ---Caller states has been exposed to her son who was  diagnosed with influenza yesterday. Now has developed a runny nose and a sore throat. Was told to call MD for Tamiflu if she developed symptoms. States her son was given Tamiflu yesterday. Patient is also having cough. Symptoms began a week ago. Patient has a history of asthma and has had pneumonia 2 weeks ago. Has the patient traveled out of the country within the last 30 days? ---No Does the patient have any new or worsening symptoms? ---Yes Will a triage be completed? ---Yes Related visit to physician within the last 2 weeks? ---Yes Does the PT have any chronic conditions? (i.e. diabetes, asthma, etc.) ---Yes List chronic conditions. ---Asthma, Ulcerative colitis, hypertension, Is this a behavioral health or substance abuse call? ---No Guidelines Guideline Title Affirmed Question Affirmed Notes Nurse Date/Time (Eastern Time) Influenza - Seasonal [1] Difficulty breathing AND [2] not severe AND [3] not from stuffy nose (e.g., not relieved by cleaning out the nose) Lillia Corporal, RN, Lenell Antu 11/01/2015 9:10:45 AM PLEASE NOTE: All timestamps contained within this report are represented as [place] Standard Time. CONFIDENTIALTY NOTICE: This fax transmission is intended only for the addressee. It contains information that is legally privileged, confidential or otherwise protected from use or disclosure. If you are not the intended recipient, you are strictly prohibited from reviewing, disclosing, copying using or disseminating any of this information or taking any action in reliance on or regarding this information. If you have received this fax in error, please notify us immediately by telephone so that we can arrange for its return to [place]. Phone: [phone], Toll-Free: [phone], Fax: [phone] Page: 2 of 2 Call Id: [id number] [place]. Time [name] Time) Disposition Final User 11/01/2015 9:16:57 AM Paged On Call back to [facility], RN, Lenell Antu 11/01/2015 9:20:37 AM Send To RN Personal  Lillia Corporal,  RN, Lenell Antu 11/01/2015 9:56:35 AM Call Completed Lillia Corporal, RN, Lenell Antu 11/01/2015 9:19:28 AM Go to ED Now (or PCP triage) Yes Lillia Corporal, RN, Allean Found Understands: Yes Disagree/Comply: Disagree Disagree/Comply Reason: Disagree with instructions Care Advice Given Per Guideline GO TO ED NOW (OR PCP TRIAGE): DRIVING: Another adult should drive. CARE ADVICE given per INFLUENZA - SEASONAL (Adult) guideline. Comments User: Arcola Jansky, RN Date/Time [name] Time): 11/01/2015 9:18:24 AM [facility] [phone] Denies any allergies. User: Arcola Jansky, RN Date/Time [name] Time): 11/01/2015 9:19:26 AM Caller wants to have physician contacted to obtain prescription for Tamiflu. Referrals [facility] Paging [facility] Phone DateTime Result/Outcome Message Type Notes Eliezer Lofts [phone] 11/01/2015 9:16:57 AM Paged On Call Back to Call Center Doctor Paged [place], [place] with [facility] [phone], patient of Dr.[facility], exposed to influenza, high risk, requests Tamiflu. Eliezer Lofts 11/01/2015 9:55:59 AM Unable to Reach on call - Max Attempts Message Result Contacted patient back to inform no response received from physician oncall, likely would require patient to be seen prior to prescribing Tamiflu. Instructed patient to call [facility] and attempt to make an appointment, and to contact us again if no appointments available and unable to request prescription through [facility] staff. Patient verbalizes understanding of instructions

## 2015-11-03 NOTE — Telephone Encounter (Signed)
I will send in the prophylactic dose of tamiflu (75 mg once daily) to her pharmacy since she was exposed  F/u this week if she does not feel better

## 2015-11-03 NOTE — Telephone Encounter (Signed)
Pt advise me that she is still having chest soreness, HA/sinus pressure, lingering cough that's sometimes productive, pt said she is coughing up white phlegm no no fever she is aware of. Pt's son was diagnosed with flu (type A) over weekend and due to pt's chronic illness (UC) and that [facility] and had a chest xray and was diagnosed with pneumonia pt wasn't sure if she should have another chest xray or if she should start tamiflu or what she should do, pt isn't feeling well  [facility] [place]

## 2015-11-03 NOTE — Telephone Encounter (Signed)
Pt notified Rx sent to pharmacy and advise of Dr. Marliss Coots comments/ instructions and verbalized understanding

## 2015-11-03 NOTE — Telephone Encounter (Signed)
Do not see where pt was seen at [facility] and unable to reach pt by phone this AM.

## 2015-11-03 NOTE — Telephone Encounter (Signed)
When you can get in touch with her -please see how she is feeling -? If she is already getting flu or just exposed  Thanks

## 2015-11-06 ENCOUNTER — Ambulatory Visit: Payer: Managed Care, Other (non HMO) | Admitting: Psychology

## 2015-11-17 ENCOUNTER — Ambulatory Visit (INDEPENDENT_AMBULATORY_CARE_PROVIDER_SITE_OTHER)
Admission: RE | Admit: 2015-11-17 | Discharge: 2015-11-17 | Disposition: A | Discharge status: Home / Self Care | Payer: Managed Care, Other (non HMO) | Source: Ambulatory Visit | Attending: Primary Care | Admitting: Primary Care

## 2015-11-17 ENCOUNTER — Encounter: Payer: Self-pay | Admitting: Primary Care

## 2015-11-17 ENCOUNTER — Other Ambulatory Visit: Payer: Self-pay | Admitting: Primary Care

## 2015-11-17 ENCOUNTER — Ambulatory Visit (INDEPENDENT_AMBULATORY_CARE_PROVIDER_SITE_OTHER): Payer: Managed Care, Other (non HMO) | Admitting: Primary Care

## 2015-11-17 VITALS — BP 142/92 | HR 82 | Temp 97.9°F | Ht 61.75 in | Wt 202.0 lb

## 2015-11-17 DIAGNOSIS — R05 Cough: Secondary | ICD-10-CM

## 2015-11-17 DIAGNOSIS — R0981 Nasal congestion: Secondary | ICD-10-CM

## 2015-11-17 DIAGNOSIS — J209 Acute bronchitis, unspecified: Secondary | ICD-10-CM

## 2015-11-17 DIAGNOSIS — R059 Cough, unspecified: Secondary | ICD-10-CM

## 2015-11-17 LAB — COMPREHENSIVE METABOLIC PANEL
ALBUMIN: 3.9 g/dL (ref 3.5–5.2)
ALT: 24 U/L (ref 0–35)
AST: 20 U/L (ref 0–37)
Alkaline Phosphatase: 66 U/L (ref 39–117)
BUN: 10 mg/dL (ref 6–23)
CALCIUM: 9.1 mg/dL (ref 8.4–10.5)
CHLORIDE: 99 meq/L (ref 96–112)
CO2: 27 meq/L (ref 19–32)
CREATININE: 0.75 mg/dL (ref 0.40–1.20)
GFR: 86.55 mL/min (ref >60.00)
Glucose, Bld: 91 mg/dL (ref 70–99)
Potassium: 3.6 mEq/L (ref 3.5–5.1)
Sodium: 134 mEq/L — ABNORMAL LOW (ref 135–145)
Total Bilirubin: 0.3 mg/dL (ref 0.2–1.2)
Total Protein: 6.6 g/dL (ref 6.0–8.3)

## 2015-11-17 LAB — CBC WITH DIFFERENTIAL/PLATELET
Basophils Absolute: 0 10*3/uL (ref 0.0–0.1)
Basophils Relative: 0.4 % (ref 0.0–3.0)
EOS ABS: 0.2 10*3/uL (ref 0.0–0.7)
Eosinophils Relative: 3.7 % (ref 0.0–5.0)
HEMATOCRIT: 42 % (ref 36.0–46.0)
HEMOGLOBIN: 13.9 g/dL (ref 12.0–15.0)
LYMPHS PCT: 17.9 % (ref 12.0–46.0)
Lymphs Abs: 0.9 10*3/uL (ref 0.7–4.0)
MCHC: 33.2 g/dL (ref 30.0–36.0)
MCV: 88.2 fl (ref 78.0–100.0)
Monocytes Absolute: 0.6 10*3/uL (ref 0.1–1.0)
Monocytes Relative: 10.7 % (ref 3.0–12.0)
Neutro Abs: 3.5 10*3/uL (ref 1.4–7.7)
Neutrophils Relative %: 67.3 % (ref 43.0–77.0)
PLATELETS: 233 10*3/uL (ref 150.0–400.0)
RBC: 4.76 Mil/uL (ref 3.87–5.11)
RDW: 14.1 % (ref 11.5–15.5)
WBC: 5.3 10*3/uL (ref 4.0–10.5)

## 2015-11-17 MED ORDER — FLUTICASONE PROPIONATE 50 MCG/ACT NA SUSP: 2.0000 | Freq: Every day | NASAL | Status: DC

## 2015-11-17 MED ORDER — BENZONATATE 200 MG PO CAPS: 200.0000 mg | ORAL_CAPSULE | Freq: Three times a day (TID) | ORAL | Status: DC | PRN

## 2015-11-17 MED ORDER — PREDNISONE 10 MG PO TABS: ORAL_TABLET | ORAL | Status: DC

## 2015-11-17 NOTE — Progress Notes (Signed)
Subjective:    Patient ID: Carol Johnson, female    DOB: March 20, 1966, 50 y.o.   MRN: [id number]  HPI  Carol Johnson is a 50 year old female with a history of allergic rhinitis and asthma who presents today with a chief complaint of nasal congestion. She also reports cough, body aches, fevers, ear fullness. Her symptoms has been present intermittently since November 2016.   This current episode of symptoms became worse on Thursday last week, which initially began in February. Her most bothersome symptom is cough. She has an albuterol inhaler at home that she will use twice daily when "sick".  She's taken antibiotic in February after diagnosis of pneumonia, Tamiflu in early March for her symptoms as her son was diagnosed with the flu. She's recently taken Mucinex, Dayquil, Nyquil, Ibuprofen, albuterol inhaler without improvement.  Overall she's feeling tired and fatigued.   Review of Systems  Constitutional: Positive for chills and fatigue. Negative for fever.  HENT: Positive for congestion and sore throat.   Respiratory: Positive for cough, shortness of breath and wheezing.   Cardiovascular: Negative for chest pain.  Musculoskeletal: Positive for myalgias.       Past Medical History  Diagnosis Date  . Internal hemorrhoid   . GERD (gastroesophageal reflux disease)   . Irritable bowel syndrome   . Fibroid uterus   . Allergic rhinitis   . Hypercholesterolemia   . Asthma   . URI (upper respiratory infection)   . Anxiety   . Depression   . Abdominal pain, unspecified site   . Pneumonia 1998  . Uterine fibroid   . DVT (deep venous thrombosis) ([place]) 2016    Social History   Social History  . Marital Status: Married    Spouse Name: N/A  . Number of Children: 1  . Years of Education: N/A   Occupational History  . Admin. assistant    Social History Main Topics  . Smoking status: Light Tobacco Smoker  . Smokeless tobacco: Never Used     Comment: as a teenager-none recent  .  Alcohol Use: 6.6 oz/week    1 Standard drinks or equivalent, 10 Cans of beer per week     Comment: occ  . Drug Use: No  . Sexual Activity: Yes    Birth Control/ Protection: Pill   Other Topics Concern  . Not on file   Social History Narrative    Past Surgical History  Procedure Laterality Date  . Myomectomy    . Uterine fibroid surgery    . Cholecystectomy      laparoscopic "gallstones"  . Cesarean section  2004  . Esophagogastroduodenoscopy (egd) with propofol N/A 05/03/2014    Procedure: ESOPHAGOGASTRODUODENOSCOPY (EGD) WITH PROPOFOL;  Surgeon: Inda Castle, MD;  Location: [facility];  Service: Endoscopy;  Laterality: N/A;    Family History  Problem Relation Age of Onset  . Colon cancer Paternal Aunt   . Uterine cancer Paternal Grandmother   . Heart disease Maternal Grandmother   . Heart disease Paternal Uncle   . Heart disease Paternal Aunt   . Kidney disease Cousin     Allergies  Allergen Reactions  . Sertraline Hcl     REACTION: diarrhea  . Zoloft [Sertraline Hcl] Diarrhea    Current Outpatient Prescriptions on File Prior to Visit  Medication Sig Dispense Refill  . amLODipine (NORVASC) 5 MG tablet Take 1 tablet (5 mg total) by mouth daily. 30 tablet 11  . budesonide (ENTOCORT EC) 3 MG 24  hr capsule Take 3 capsules (9 mg total) by mouth daily. 90 capsule 2  . buPROPion (WELLBUTRIN XL) 150 MG 24 hr tablet Take 1 tablet (150 mg total) by mouth daily. 30 tablet 11  . escitalopram (LEXAPRO) 10 MG tablet Take 1 tablet (10 mg total) by mouth daily. 30 tablet 11  . ferrous sulfate 325 (65 FE) MG tablet Take 1 tablet (325 mg total) by mouth 2 (two) times daily with a meal. 60 tablet 3  . mesalamine (LIALDA) 1.2 g EC tablet TAKE 4 TABLETS (4.8 G TOTAL) BY MOUTH DAILY WITH BREAKFAST. 120 tablet 2  . oseltamivir (TAMIFLU) 75 MG capsule Take 1 capsule (75 mg total) by mouth daily. 7 capsule 0  . pantoprazole (PROTONIX) 40 MG tablet Take 1 tablet (40 mg total) by mouth  daily. 30 tablet 11  . zolpidem (AMBIEN CR) 12.5 MG CR tablet Take 1 tablet (12.5 mg total) by mouth at bedtime as needed. for sleep 30 tablet 3   No current facility-administered medications on file prior to visit.    BP 142/92 mmHg  Pulse 82  Temp(Src) 97.9 F (36.6 C) (Oral)  Ht 5' 1.75" (1.568 m)  Wt 202 lb (91.627 kg)  BMI 37.27 kg/m2  SpO2 95%  LMP 10/20/2015    Objective:   Physical Exam  Constitutional: She appears well-nourished.  Non-toxic appearance. She appears ill.  HENT:  Right Ear: Tympanic membrane and ear canal normal.  Left Ear: Tympanic membrane and ear canal normal.  Nose: Right sinus exhibits no maxillary sinus tenderness and no frontal sinus tenderness. Left sinus exhibits no maxillary sinus tenderness and no frontal sinus tenderness.  Mouth/Throat: Oropharynx is clear and moist.  Neck: Neck supple.  Cardiovascular: Normal rate and regular rhythm.   Pulmonary/Chest: She has rhonchi in the right lower field.  Lymphadenopathy:    She has cervical adenopathy.  Skin: Skin is warm and dry.          Assessment & Plan:  Viral Bronchitis:  Cough intermittently since February 2017. Diagnosed and treated for pneumonia, then treated for influenza prophylaxis. Symptoms worse Thursday last week. Exam with rhonchi to RLF, appears ill, otherwise reassuring exam. Vitals stable. Given long duration of symptoms despite treatment, further work up necessary. Chest xray: Mild bronchitis, no pneumonia. CBC, CMP: Unremarkable. Suspect viral bronchitis and will treat with supportive measures. RX for Prednisone taper, Tessalon pearls, Flonase. Discussed post bronchitic cough and to notify us if no improvement x 1 week. Strict return precautions provided.

## 2015-11-17 NOTE — Patient Instructions (Signed)
Complete lab work prior to leaving today. I will notify you of your results once received.   Complete xray(s) prior to leaving today. I will notify you of your results once received.  You may take Benzonatate capsules for cough. Take 1 capsule by mouth three times daily as needed for cough.  Nasal Congestion: Try using Flonase (fluticasone) nasal spray. Instill 2 sprays in each nostril once daily.   I will he in touch with you later today or tomorrow.  It was a pleasure meeting you!

## 2015-11-17 NOTE — Progress Notes (Signed)
Pre visit review using our clinic review tool, if applicable. No additional management support is needed unless otherwise documented below in the visit note. 

## 2015-11-20 ENCOUNTER — Telehealth: Payer: Self-pay | Admitting: Family Medicine

## 2015-11-20 NOTE — Telephone Encounter (Signed)
She saw Anda Kraft for her most recent illness- will cc to her  cxr showed bronchitis-did not mention signs of pulm fibrosis  Labs were for cbc and chemistries and they were ok  We will try to address other questions at her f/u appt as well

## 2015-11-20 NOTE — Telephone Encounter (Signed)
I called the patient personally after results were received and explained all of that information to her already.  I do think we need to dive a little deeper as she's been acute ill numerous times. Agree that she needs follow up. Thanks for the update.

## 2015-11-20 NOTE — Telephone Encounter (Signed)
Pt called wanting to know What did you figure out on pt chest xray What were you testing on labs? She wanted to remind you that her dad died from pulmonary fibrosis She has been sic 6 or more times since nov She wanted to know if you could  test her immune system or  For mold spurs Pt has ulcer colitis  Pt is not better she is breathing better Face still  Hurts ear pain fever going away   She wanted to know another antibiotic  She wants to make sure she doesn't have pulmonary fibrosis Pt has appointment with dr tower 3/28

## 2015-11-21 MED ORDER — AMOXICILLIN-POT CLAVULANATE 875-125 MG PO TABS: 1.0000 | ORAL_TABLET | Freq: Two times a day (BID) | ORAL | Status: DC

## 2015-11-21 NOTE — Telephone Encounter (Signed)
Pt notified Rx sent 

## 2015-11-21 NOTE — Addendum Note (Signed)
Addended by: Loura Pardon A on: 11/21/2015 01:13 PM   Modules accepted: Orders

## 2015-11-21 NOTE — Telephone Encounter (Signed)
I sent augmentin to her [facility]

## 2015-11-21 NOTE — Telephone Encounter (Signed)
Pt advised of Dr. Glori Bickers and Allie Bossier, NP comments and verbalized understanding  Pt is requesting an abx sent to her pharmacy, pt said she still has a cough, and a little congestion but her main sxs are facial/sinus pain, and ear pain, pt thinks she has a sinus inf., please advise

## 2015-11-25 ENCOUNTER — Ambulatory Visit: Payer: Self-pay | Admitting: Family Medicine

## 2015-12-01 ENCOUNTER — Ambulatory Visit (INDEPENDENT_AMBULATORY_CARE_PROVIDER_SITE_OTHER): Payer: Managed Care, Other (non HMO) | Admitting: Family Medicine

## 2015-12-01 ENCOUNTER — Encounter: Payer: Self-pay | Admitting: Family Medicine

## 2015-12-01 ENCOUNTER — Encounter: Payer: Self-pay | Admitting: *Deleted

## 2015-12-01 VITALS — BP 144/96 | HR 72 | Temp 97.3°F | Ht 61.75 in | Wt 203.0 lb

## 2015-12-01 DIAGNOSIS — J019 Acute sinusitis, unspecified: Secondary | ICD-10-CM | POA: Insufficient documentation

## 2015-12-01 DIAGNOSIS — J0100 Acute maxillary sinusitis, unspecified: Secondary | ICD-10-CM

## 2015-12-01 DIAGNOSIS — R140 Abdominal distension (gaseous): Secondary | ICD-10-CM | POA: Insufficient documentation

## 2015-12-01 DIAGNOSIS — R5383 Other fatigue: Secondary | ICD-10-CM | POA: Insufficient documentation

## 2015-12-01 DIAGNOSIS — R5382 Chronic fatigue, unspecified: Secondary | ICD-10-CM | POA: Diagnosis not present

## 2015-12-01 DIAGNOSIS — E538 Deficiency of other specified B group vitamins: Secondary | ICD-10-CM | POA: Insufficient documentation

## 2015-12-01 LAB — CBC WITH DIFFERENTIAL/PLATELET
Basophils Absolute: 0 10*3/uL (ref 0.0–0.1)
Basophils Relative: 0.4 % (ref 0.0–3.0)
EOS PCT: 2.2 % (ref 0.0–5.0)
Eosinophils Absolute: 0.2 10*3/uL (ref 0.0–0.7)
HEMATOCRIT: 41.3 % (ref 36.0–46.0)
HEMOGLOBIN: 13.9 g/dL (ref 12.0–15.0)
LYMPHS PCT: 22.8 % (ref 12.0–46.0)
Lymphs Abs: 2.2 10*3/uL (ref 0.7–4.0)
MCHC: 33.6 g/dL (ref 30.0–36.0)
MCV: 87.9 fl (ref 78.0–100.0)
MONOS PCT: 6.8 % (ref 3.0–12.0)
Monocytes Absolute: 0.7 10*3/uL (ref 0.1–1.0)
Neutro Abs: 6.7 10*3/uL (ref 1.4–7.7)
Neutrophils Relative %: 67.8 % (ref 43.0–77.0)
Platelets: 355 10*3/uL (ref 150.0–400.0)
RBC: 4.7 Mil/uL (ref 3.87–5.11)
RDW: 13.9 % (ref 11.5–15.5)
WBC: 9.8 10*3/uL (ref 4.0–10.5)

## 2015-12-01 LAB — VITAMIN B12: Vitamin B-12: 208 pg/mL — ABNORMAL LOW (ref 211–911)

## 2015-12-01 LAB — COMPREHENSIVE METABOLIC PANEL
ALBUMIN: 3.8 g/dL (ref 3.5–5.2)
ALK PHOS: 59 U/L (ref 39–117)
ALT: 15 U/L (ref 0–35)
AST: 12 U/L (ref 0–37)
BILIRUBIN TOTAL: 0.3 mg/dL (ref 0.2–1.2)
BUN: 10 mg/dL (ref 6–23)
CO2: 27 mEq/L (ref 19–32)
Calcium: 8.7 mg/dL (ref 8.4–10.5)
Chloride: 105 mEq/L (ref 96–112)
Creatinine, Ser: 0.65 mg/dL (ref 0.40–1.20)
GFR: 102.08 mL/min (ref >60.00)
Glucose, Bld: 86 mg/dL (ref 70–99)
POTASSIUM: 3.7 meq/L (ref 3.5–5.1)
Sodium: 140 mEq/L (ref 135–145)
TOTAL PROTEIN: 6.5 g/dL (ref 6.0–8.3)

## 2015-12-01 LAB — TSH: TSH: 2.59 u[IU]/mL (ref 0.35–4.50)

## 2015-12-01 MED ORDER — AMOXICILLIN-POT CLAVULANATE 875-125 MG PO TABS: 1.0000 | ORAL_TABLET | Freq: Two times a day (BID) | ORAL | Status: DC

## 2015-12-01 NOTE — Patient Instructions (Signed)
Take augmentin as directed  If sinus symptoms do not continue to improve please alert me  For bloating-get align over the counter - take it now and for 2 weeks after you finish the antibiotic See GI as planned - show them the area you are concerned about  Labs today for fatigue  Make sure to drink enough fluids   Try to get 1200-1500 mg of calcium per day with at least 1000 iu of vitamin D - for bone health

## 2015-12-01 NOTE — Progress Notes (Signed)
Pre visit review using our clinic review tool, if applicable. No additional management support is needed unless otherwise documented below in the visit note. 

## 2015-12-01 NOTE — Assessment & Plan Note (Signed)
Improved after 7 d of augmentin but still not 100% better Will repeat course Probiotic as well due to abd bloating Disc tx for allergies and allergen avoidance Update if not starting to improve in a week or if worsening

## 2015-12-01 NOTE — Assessment & Plan Note (Signed)
In pt with hx of UC and also IBS Has been on abx and also prednisone-may have bacterial overgrowth/imbalance  Lab today  Start align and take 2 wk past last abx pill  F/u with GI She has concerns also re: LUQ pain and firmness - will get this checked

## 2015-12-01 NOTE — Progress Notes (Signed)
Subjective:    Patient ID: Carol Johnson, female    DOB: October 11, 1965, 50 y.o.   MRN: [id number]  HPI Here for ongoing resp symptoms - since Feb as well as abd bloating and fatigue  CXR last CLINICAL DATA: Intermittent cough for 5 months with fever and shortness of breath. Treated for pneumonia this month.  EXAM: CHEST 2 VIEW  COMPARISON: Chest CT 03/03/2004 abdominal CT 04/18/2014. Radiographs 07/18/2003.  FINDINGS: The heart size and mediastinal contours are stable. There is mild central airway thickening without hyperinflation, confluent airspace opacity, edema or pleural effusion. The bones appear unremarkable.  IMPRESSION: Mild central airway thickening suggesting bronchitis. No evidence of pneumonia.  Called on 2/23 and tx with augmentin for sinusitis   Pt states that augmentin did help a lot  Improved but not 100%  Still has pain in R ear- stopped up and pain in face under eyes (no ear drainage)  Still coughing at night- tickle in throat  Nasal d/c is yellow  Phlegm - not a lot (but cough sounds junky) Most of the wheezing has stopped   Last week had a fever    Also more abdominal bloating  Cannot button clothes Very gassy  Sees GI on 4/17 Also feels a hard lump L side upper abd  Diarrhea is common for her with UC - on and off / no diff than usual   Concerned because she is fatigued and gets sick frequently Was on prednisone last mo Also endocort for her UC  Wonders about food allergies  Wonders if any vitamins would help Would like lab for fatigue today  Patient Active Problem List   Diagnosis Date Noted  . Lymphocytic colitis 01/16/2015  . Anemia, iron deficiency 01/16/2015  . Right leg pain 12/30/2014  . Anxiety state 12/20/2014  . Diarrhea 11/27/2014  . Insomnia 11/27/2014  . Sore throat 09/17/2014  . Left leg DVT ([place]) 09/17/2014  . Sleep disorder 09/02/2014  . Gastritis, chronic 06/06/2014  . Unspecified gastritis and  gastroduodenitis without mention of hemorrhage 05/03/2014  . Abdominal pain, epigastric 04/19/2014  . Internal hemorrhoids with other complication 27/78/2423  . Obese 06/27/2013  . Routine general medical examination at a health care facility 10/14/2011  . Thyroid nodule 05/16/2011  . Rectal bleeding 11/24/2010  . Hemorrhoids, internal 11/24/2010  . Irritable bowel syndrome (IBS) 11/24/2010  . ANEMIA 10/02/2009  . Hyperlipidemia 07/18/2009  . Essential hypertension 07/18/2009  . DYSPNEA ON EXERTION 07/18/2009  . THYROID CYST 01/31/2008  . ANXIETY 01/31/2008  . Depression 01/31/2008  . GASTRIC ULCER, HX OF 01/31/2008  . FIBROIDS, UTERUS 11/13/2007  . ALLERGIC RHINITIS 11/13/2007  . ASTHMA 11/13/2007  . GERD 11/13/2007  . IRRITABLE BOWEL SYNDROME 11/13/2007  . INTERNAL HEMORRHOIDS 11/12/2003   Past Medical History  Diagnosis Date  . Internal hemorrhoid   . GERD (gastroesophageal reflux disease)   . Irritable bowel syndrome   . Fibroid uterus   . Allergic rhinitis   . Hypercholesterolemia   . Asthma   . URI (upper respiratory infection)   . Anxiety   . Depression   . Abdominal pain, unspecified site   . Pneumonia 1998  . Uterine fibroid   . DVT (deep venous thrombosis) ([place]) 2016   Past Surgical History  Procedure Laterality Date  . Myomectomy    . Uterine fibroid surgery    . Cholecystectomy      laparoscopic "gallstones"  . Cesarean section  2004  . Esophagogastroduodenoscopy (egd) with propofol N/A 05/03/2014  Procedure: ESOPHAGOGASTRODUODENOSCOPY (EGD) WITH PROPOFOL;  Surgeon: Inda Castle, MD;  Location: [facility];  Service: Endoscopy;  Laterality: N/A;   Social History  Substance Use Topics  . Smoking status: Light Tobacco Smoker  . Smokeless tobacco: Never Used  . Alcohol Use: 6.6 oz/week    1 Standard drinks or equivalent, 10 Cans of beer per week     Comment: occ   Family History  Problem Relation Age of Onset  . Colon cancer Paternal Aunt   .  Uterine cancer Paternal Grandmother   . Heart disease Maternal Grandmother   . Heart disease Paternal Uncle   . Heart disease Paternal Aunt   . Kidney disease Cousin    Allergies  Allergen Reactions  . Sertraline Hcl     REACTION: diarrhea  . Zoloft [Sertraline Hcl] Diarrhea   Current Outpatient Prescriptions on File Prior to Visit  Medication Sig Dispense Refill  . amLODipine (NORVASC) 5 MG tablet Take 1 tablet (5 mg total) by mouth daily. 30 tablet 11  . budesonide (ENTOCORT EC) 3 MG 24 hr capsule Take 3 capsules (9 mg total) by mouth daily. 90 capsule 2  . buPROPion (WELLBUTRIN XL) 150 MG 24 hr tablet Take 1 tablet (150 mg total) by mouth daily. 30 tablet 11  . escitalopram (LEXAPRO) 10 MG tablet Take 1 tablet (10 mg total) by mouth daily. 30 tablet 11  . ferrous sulfate 325 (65 FE) MG tablet Take 1 tablet (325 mg total) by mouth 2 (two) times daily with a meal. 60 tablet 3  . fluticasone (FLONASE) 50 MCG/ACT nasal spray Place 2 sprays into both nostrils daily. 16 g 0  . mesalamine (LIALDA) 1.2 g EC tablet TAKE 4 TABLETS (4.8 G TOTAL) BY MOUTH DAILY WITH BREAKFAST. 120 tablet 2  . pantoprazole (PROTONIX) 40 MG tablet Take 1 tablet (40 mg total) by mouth daily. 30 tablet 11  . zolpidem (AMBIEN CR) 12.5 MG CR tablet Take 1 tablet (12.5 mg total) by mouth at bedtime as needed. for sleep 30 tablet 3   No current facility-administered medications on file prior to visit.     Review of Systems Review of Systems  Constitutional: Negative for fever, appetite change,  and unexpected weight change. pos for fatigue and malaise Eyes: Negative for pain and visual disturbance.  ENT pos for congestion/facial pressure and ear pain and pnd Respiratory: Negative for wheeze and shortness of breath.   Cardiovascular: Negative for cp or palpitations    Gastrointestinal: Negative for nausea, blood in stool and constipation. pos for abdominal bloating and discomfort  Genitourinary: Negative for urgency  and frequency.  Skin: Negative for pallor or rash   Neurological: Negative for weakness, light-headedness, numbness and headaches.  Hematological: Negative for adenopathy. Does not bruise/bleed easily.  Psychiatric/Behavioral: Negative for dysphoric mood. The patient is not nervous/anxious.         Objective:   Physical Exam  Constitutional: She appears well-developed and well-nourished. No distress.  obese and well appearing   HENT:  Head: Normocephalic and atraumatic.  Right Ear: External ear normal.  Left Ear: External ear normal.  Mouth/Throat: Oropharynx is clear and moist. No oropharyngeal exudate.  Nares are injected and congested  Bilateral maxillary sinus tenderness  Post nasal drip   Eyes: Conjunctivae and EOM are normal. Pupils are equal, round, and reactive to light. Right eye exhibits no discharge. Left eye exhibits no discharge.  Neck: Normal range of motion. Neck supple. No JVD present. Carotid bruit is not present.  No thyromegaly present.  Cardiovascular: Normal rate, regular rhythm, normal heart sounds and intact distal pulses.  Exam reveals no gallop.   Pulmonary/Chest: Effort normal and breath sounds normal. No respiratory distress. She has no wheezes. She has no rales. She exhibits no tenderness.  No crackles  Abdominal: Soft. Bowel sounds are normal. She exhibits no distension, no abdominal bruit and no mass. There is tenderness. There is no rebound and no guarding.  Nl bs Tender in LUQ (no rebound or guarding) No ascites or fluid wave     Musculoskeletal: She exhibits no edema.  Lymphadenopathy:    She has no cervical adenopathy.  Neurological: She is alert. She has normal reflexes. No cranial nerve deficit.  Skin: Skin is warm and dry. No rash noted. No pallor.  Psychiatric: She has a normal mood and affect.          Assessment & Plan:   Problem List Items Addressed This Visit      Respiratory   Acute sinusitis    Improved after 7 d of augmentin  but still not 100% better Will repeat course Probiotic as well due to abd bloating Disc tx for allergies and allergen avoidance Update if not starting to improve in a week or if worsening        Relevant Medications   amoxicillin-clavulanate (AUGMENTIN) 875-125 MG tablet     Other   Abdominal bloating - Primary    In pt with hx of UC and also IBS Has been on abx and also prednisone-may have bacterial overgrowth/imbalance  Lab today  Start align and take 2 wk past last abx pill  F/u with GI She has concerns also re: LUQ pain and firmness - will get this checked       Fatigue    Poss multifactorial -with UC and a lot of acute illnesses lately Pt has questions about vitamin deficiencies  Lab today incl B12 level and cbc and TSH      Relevant Orders   CBC with Differential/Platelet (Completed)   Comprehensive metabolic panel (Completed)   TSH (Completed)   Vitamin B12 (Completed)

## 2015-12-01 NOTE — Assessment & Plan Note (Signed)
Poss multifactorial -with UC and a lot of acute illnesses lately Pt has questions about vitamin deficiencies  Lab today incl B12 level and cbc and TSH

## 2015-12-05 ENCOUNTER — Ambulatory Visit (INDEPENDENT_AMBULATORY_CARE_PROVIDER_SITE_OTHER): Payer: Managed Care, Other (non HMO)

## 2015-12-05 DIAGNOSIS — E538 Deficiency of other specified B group vitamins: Secondary | ICD-10-CM | POA: Diagnosis not present

## 2015-12-05 MED ORDER — CYANOCOBALAMIN 1000 MCG/ML IJ SOLN
1000.0000 ug | Freq: Once | INTRAMUSCULAR | Status: AC
  Administered 2015-12-05: 1000 ug via INTRAMUSCULAR

## 2015-12-11 ENCOUNTER — Ambulatory Visit: Payer: Self-pay

## 2015-12-12 ENCOUNTER — Ambulatory Visit: Payer: Self-pay

## 2015-12-15 ENCOUNTER — Other Ambulatory Visit: Payer: Self-pay | Admitting: Primary Care

## 2015-12-15 ENCOUNTER — Ambulatory Visit (INDEPENDENT_AMBULATORY_CARE_PROVIDER_SITE_OTHER): Payer: Managed Care, Other (non HMO) | Admitting: Gastroenterology

## 2015-12-15 ENCOUNTER — Encounter: Payer: Self-pay | Admitting: Gastroenterology

## 2015-12-15 VITALS — BP 130/88 | HR 76 | Ht 61.75 in | Wt 206.0 lb

## 2015-12-15 DIAGNOSIS — R197 Diarrhea, unspecified: Secondary | ICD-10-CM

## 2015-12-15 DIAGNOSIS — K589 Irritable bowel syndrome without diarrhea: Secondary | ICD-10-CM | POA: Diagnosis not present

## 2015-12-15 DIAGNOSIS — K52832 Lymphocytic colitis: Secondary | ICD-10-CM

## 2015-12-15 NOTE — Patient Instructions (Signed)
Taper Entocort to 2 tablets (87m) daily for 1 week followed by 1 tablet daily (3 mg) for 1 week then stop  Avoid lactose, Fiber and artificial sweeteners  Stop Lialda   Lactose-Free Diet, Adult If you have lactose intolerance, you are not able to digest lactose. Lactose is a natural sugar found mainly in milk and milk products. You may need to avoid all foods and beverages that contain lactose. A lactose-free diet can help you do this.  WHAT DO I NEED TO KNOW ABOUT THIS DIET?  Do not consume foods, beverages, vitamins, minerals, or medicines with lactose. Read ingredients lists carefully.  Look for the words "lactose-free" on labels.  Use lactase enzyme drops or tablets as directed by your health care provider.  Use lactose-free milk or a milk alternative, such as soy milk, for drinking and cooking.  Make sure you get enough calcium and vitamin D in your diet. A lactose-free eating plan can be lacking in these important nutrients.  Take calcium and vitamin D supplements as directed by your health care provider. Talk to your provider about supplements if you are not able to get enough calcium and vitamin D from food. WHICH FOODS HAVE LACTOSE? Lactose is found in:   Milk and foods made from milk.  Yogurt.   Cheese.  Butter.   Margarine.   Sour cream.   Cream.   Whipped toppings and nondairy creamers.  Ice cream and other milk-based desserts. Lactose is also found in foods or products made with milk or milk ingredients. To find out whether a food contains milk or a milk ingredient, look at the ingredients list. Avoid foods with the statement "May contain milk" and foods that contain:   Butter.   Cream.  Milk.  Milk solids.  Milk powder.   Whey.  Curd.  Caseinate.  Lactose.  Lactalbumin.  Lactoglobulin. WHAT ARE SOME ALTERNATIVES TO MILK AND FOODS MADE WITH MILK PRODUCTS?  Lactose-free milk.  Soy milk with added calcium and vitamin  D.  Almond, coconut, or rice milk with added calcium and vitamin D. Note that these are low in protein.   Soy products, such as soy yogurt, soy cheese, soy ice cream, and soy-based sour cream. WHICH FOODS CAN I EAT? Grains Breads and rolls made without milk, such as [place] or [place], bagels, pita, and m[employer] Corn tortillas, corn meal, grits, and polenta. Crackers without lactose or milk solids, such as soda crackers and graham crackers. Cooked or dry cereals without lactose or milk solids. Pasta, quinoa, couscous, barley, oats, bulgur, farro, rice, wild rice, or other grains prepared without milk or lactose. Plain popcorn.  Vegetables Fresh, frozen, and canned vegetables without cheese, cream, or butter sauces. Fruits All fresh, canned, frozen, or dried fruits that are not processed with lactose. Meats and Other Protein Sources Plain beef, chicken, fish, [place] lamb, veal, pork, wild game, or ham. Kosher-prepared meat products. Strained or junior meats that do not contain milk. Eggs. Soy meat substitutes. Beans, lentils, and hummus. Tofu. Nuts and seeds. Peanut or other nut butters without lactose. Soups, casseroles, and mixed dishes without cheese, cream, or milk.  Dairy Lactose-free milk. Soy, rice, or almond milk with added calcium and vitamin D. Soy cheese and yogurt. Beverages Carbonated drinks. Tea. Coffee, freeze-dried coffee, and some instant coffees. Fruit and vegetable juices.  Condiments Soy sauce. Carob powder. Olives. Gravy made with water. Baker's cocoa. PAngie Fava Pure seasonings and spices. Ketchup. Mustard. Bouillon. Broth.  Sweets and Desserts Water and  fruit ices. Gelatin. Cookies, pies, or cakes made from allowed ingredients, such as angel food cake. Pudding made with water or a milk substitute. Lactose-free tofu desserts. Soy, coconut milk, or rice-milk-based frozen desserts. Sugar. Honey. Jam, jelly, and marmalade. Molasses. Pure sugar candy. Dark  chocolate without milk. Marshmallows.  Fats and Oils Margarines and salad dressings that do not contain milk. Berniece Salines. Vegetable oils. Shortening. Mayonnaise. Soy or coconut-based cream.  The items listed above may not be a complete list of recommended foods or beverages. Contact your dietitian for more options.  WHICH FOODS ARE NOT RECOMMENDED? Grains Breads and rolls that contain milk. Toaster pastries. Muffins, biscuits, waffles, cornbread, and pancakes. These can be prepared at home, commercial, or from mixes. Sweet rolls, donuts, English muffins, fry bread, lefse, flour tortillas with lactose, or [place] toast made with milk or milk ingredients. Crackers that contain lactose. Corn curls. Cooked or dry cereals with lactose. Vegetables Creamed or breaded vegetables. Vegetables in a cheese or butter sauce or with lactose-containing margarines. Instant potatoes. [place] fries. Scalloped or au gratin potatoes.  Fruits None.  Meats and Other Protein Sources Scrambled eggs, omelets, and souffles that contain milk. Creamed or breaded meat, fish, chicken, or [place]. Sausage products, such as wieners and liver sausage. Cold cuts that contain milk solids. Cheese, cottage cheese, ricotta cheese, and cheese spreads. Lasagna and macaroni and cheese. Pizza. Peanut or other nut butters with added milk solids. Casseroles or mixed dishes containing milk or cheese.  Dairy All dairy products, including milk, goat's milk, buttermilk, kefir, acidophilus milk, flavored milk, evaporated milk, condensed milk, dulce de [place], eggnog, yogurt, cheese, and cheese spreads.  Beverages Hot chocolate. Cocoa with lactose. Instant iced teas. Powdered fruit drinks. Smoothies made with milk or yogurt.  Condiments Chewing gum that has lactose. Cocoa that has lactose. Spice blends if they contain milk products. Artificial sweeteners that contain lactose. Nondairy creamers.  Sweets and Desserts Ice cream, ice milk, gelato, sherbet,  and frozen yogurt. Custard, pudding, and mousse. Cake, cream pies, cookies, and other desserts containing milk, cream, cream cheese, or milk chocolate. Pie crust made with milk-containing margarine or butter. Reduced-calorie desserts made with a sugar substitute that contains lactose. Toffee and butterscotch. Milk, white, or dark chocolate that contains milk. Fudge. Caramel.  Fats and Oils Margarines and salad dressings that contain milk or cheese. Cream. Half and half. Cream cheese. Sour cream. Chip dips made with sour cream or yogurt.  The items listed above may not be a complete list of foods and beverages to avoid. Contact your dietitian for more information. AM I GETTING ENOUGH CALCIUM? Calcium is found in many foods that contain lactose and is important for bone health. The amount of calcium you need depends on your age:   Adults younger than 50 years: 1000 mg of calcium a day.  Adults older than 50 years: 1200 mg of calcium a day. If you are not getting enough calcium, other calcium sources include:   Orange juice with calcium added. There are 300-350 mg of calcium in 1 cup of orange juice.   Sardines with edible bones. There are 325 mg of calcium in 3 oz of sardines.   Calcium-fortified soy milk. There are 300-400 mg of calcium in 1 cup of calcium-fortified soy milk.  Calcium-fortified rice or almond milk. There are 300 mg of calcium in 1 cup of calcium-fortified rice or almond milk.  Canned salmon with edible bones. There are 180 mg of calcium in 3 oz of canned salmon  with edible bones.   Calcium-fortified breakfast cereals. There are [phone] mg of calcium in calcium-fortified breakfast cereals.   Tofu set with calcium sulfate. There are 250 mg of calcium in  cup of tofu set with calcium sulfate.  Spinach, cooked. There are 145 mg of calcium in  cup of cooked spinach.  Edamame, cooked. There are 130 mg of calcium in  cup of cooked edamame.  Collard greens, cooked.  There are 125 mg of calcium in  cup of cooked collard greens.  Kale, frozen or cooked. There are 90 mg of calcium in  cup of cooked or frozen kale.   Almonds. There are 95 mg of calcium in  cup of almonds.  Broccoli, cooked. There are 60 mg of calcium in 1 cup of cooked broccoli.   This information is not intended to replace advice given to you by your health care provider. Make sure you discuss any questions you have with your health care provider.   Document Released: 02/05/2002 Document Revised: 12/31/2014 Document Reviewed: 11/16/2013 Elsevier Interactive Patient Education [employer].

## 2015-12-15 NOTE — Progress Notes (Signed)
Carol Johnson    [id number]    06-Sep-1965  Primary Care Physician:Marne Tower, MD  Referring Physician: Abner Greenspan, MD [address] [place], [place] [address]  Chief complaint:  Diarrhea  HPI: 50 year old female previously followed by Dr. Deatra Ina diagnosed with lymphocytic colitis in May 2016 here for follow-up visit and to establish care with me. She was started on Lialda 2.4 g and subsequently increased to 4.8 g in May last year, given persistent loose stools 2-3 times a day, she was started on Entocort 9 mg daily. She continues to have 3-5 loose bowel movements a day, denies waking up at night. She drinks 3-4 cans of sodas a day and does use some artificial sweeteners as well. She also drinks milk. She avoids fiber as it increases fecal urgency and frequency and also causes bloating. She has gained more than 30 pounds in the past few months (175 lbs in May 2016 to current weight of 206 lbs). Denies any nausea, vomiting, abdominal pain, melena or bright red blood per rectum    Outpatient Encounter Prescriptions as of 12/15/2015  Medication Sig  . amLODipine (NORVASC) 5 MG tablet Take 1 tablet (5 mg total) by mouth daily.  . budesonide (ENTOCORT EC) 3 MG 24 hr capsule Take 3 capsules (9 mg total) by mouth daily.  Marland Kitchen buPROPion (WELLBUTRIN XL) 150 MG 24 hr tablet Take 1 tablet (150 mg total) by mouth daily.  Marland Kitchen escitalopram (LEXAPRO) 10 MG tablet Take 1 tablet (10 mg total) by mouth daily.  . ferrous sulfate 325 (65 FE) MG tablet Take 1 tablet (325 mg total) by mouth 2 (two) times daily with a meal.  . fluticasone (FLONASE) 50 MCG/ACT nasal spray Place 2 sprays into both nostrils daily.  . mesalamine (LIALDA) 1.2 g EC tablet TAKE 4 TABLETS (4.8 G TOTAL) BY MOUTH DAILY WITH BREAKFAST.  . pantoprazole (PROTONIX) 40 MG tablet Take 1 tablet (40 mg total) by mouth daily.  Marland Kitchen zolpidem (AMBIEN CR) 12.5 MG CR tablet Take 1 tablet (12.5 mg total) by mouth at  bedtime as needed. for sleep  . [DISCONTINUED] amoxicillin-clavulanate (AUGMENTIN) 875-125 MG tablet Take 1 tablet by mouth 2 (two) times daily.   No facility-administered encounter medications on file as of 12/15/2015.    Allergies as of 12/15/2015 - Review Complete 12/15/2015  Allergen Reaction Noted  . Sertraline hcl  01/31/2008  . Zoloft [sertraline hcl] Diarrhea 04/19/2014    Past Medical History  Diagnosis Date  . Internal hemorrhoid   . GERD (gastroesophageal reflux disease)   . Irritable bowel syndrome   . Fibroid uterus   . Allergic rhinitis   . Hypercholesterolemia   . Asthma   . URI (upper respiratory infection)   . Anxiety   . Depression   . Abdominal pain, unspecified site   . Pneumonia 1998  . Uterine fibroid   . DVT (deep venous thrombosis) ([place]) 2016  . Vitamin B12 deficiency     Past Surgical History  Procedure Laterality Date  . Myomectomy    . Uterine fibroid surgery    . Cholecystectomy      laparoscopic "gallstones"  . Cesarean section  2004  . Esophagogastroduodenoscopy (egd) with propofol N/A 05/03/2014    Procedure: ESOPHAGOGASTRODUODENOSCOPY (EGD) WITH PROPOFOL;  Surgeon: Inda Castle, MD;  Location: [facility];  Service: Endoscopy;  Laterality: N/A;    Family History  Problem Relation Age of  Onset  . Colon cancer Paternal Aunt   . Uterine cancer Paternal Grandmother   . Heart disease Maternal Grandmother   . Heart disease Paternal Uncle   . Heart disease Paternal Aunt   . Kidney disease Cousin     Social History   Social History  . Marital Status: Married    Spouse Name: N/A  . Number of Children: 1  . Years of Education: N/A   Occupational History  . Admin. assistant    Social History Main Topics  . Smoking status: Light Tobacco Smoker  . Smokeless tobacco: Never Used  . Alcohol Use: 6.6 oz/week    1 Standard drinks or equivalent, 10 Cans of beer per week     Comment: occ  . Drug Use: No  . Sexual Activity: Yes     Birth Control/ Protection: Pill   Other Topics Concern  . Not on file   Social History Narrative      Review of systems: Review of Systems  Constitutional: Negative for fever and chills.  HENT: Negative.   Eyes: Negative for blurred vision.  Respiratory: Negative for cough, shortness of breath and wheezing.   Cardiovascular: Negative for chest pain and palpitations.  Gastrointestinal: as per HPI Genitourinary: Negative for dysuria, urgency, frequency and hematuria.  Musculoskeletal: Negative for myalgias, back pain and joint pain.  Skin: Negative for itching and rash.  Neurological: Negative for dizziness, tremors, focal weakness, seizures and loss of consciousness.  Endo/Heme/Allergies: Negative for environmental allergies.  Psychiatric/Behavioral: Negative for depression, suicidal ideas and hallucinations.  All other systems reviewed and are negative.   Physical Exam: Filed Vitals:   12/15/15 1350  BP: 130/88  Pulse: 76   Gen:      No acute distress HEENT:  EOMI, sclera anicteric Neck:     No masses; no thyromegaly Lungs:    Clear to auscultation bilaterally; normal respiratory effort CV:         Regular rate and rhythm; no murmurs Abd:      + bowel sounds; soft, non-tender; no palpable masses, no distension Ext:    No edema; adequate peripheral perfusion Skin:      Warm and dry; no rash Neuro: alert and oriented x 3 Psych: normal mood and affect  Data Reviewed:  colonoscopy 12/2014 SURFACE INTRAEPITHELIAL LYMPHOCYTOSIS, CONSISTENT WITH LYMPHOCYTIC COLITIS. - NO EVIDENCE OF INFLAMMATORY BOWEL DISEASE. - NEGATIVE FOR DYSPLASIA OR MALIGNANCY.  Assessment and Plan/Recommendations: 50 year old female with lymphocytic colitis diagnosed on colonoscopy in May 2016 here for follow-up visit She continues to have 3-5 loose bowel movements a day, no nocturnal symptoms We'll discontinue Lialda as no evidence of ulcerative colitis on the colon biopsies She has been on  Entocort for almost 11 months now, we'll start tapering it down. Take 6 mg daily for a week and then 3 mg daily for a week and stop Avoid soda, artificial sweeteners, excessive fiber and lactose If she has increased all frequency or diarrhea, can use Imodium as needed. We'll have to consider repeat colonoscopies with biopsies to evaluate for persistent lymphocytic colitis.  Return in 1 month or sooner if needed  K. Denzil Magnuson , MD [phone] Mon-Fri 8a-5p [phone] after 5p, weekends, holidays

## 2015-12-17 ENCOUNTER — Encounter: Payer: Self-pay | Admitting: Family Medicine

## 2015-12-19 ENCOUNTER — Ambulatory Visit: Payer: Self-pay

## 2015-12-26 ENCOUNTER — Ambulatory Visit: Payer: Self-pay

## 2015-12-29 ENCOUNTER — Telehealth: Payer: Self-pay | Admitting: Family Medicine

## 2015-12-29 NOTE — Telephone Encounter (Signed)
Pt has missed b12 injections, but is taking b12 orally (not sure of strength)  Wants to  know if you still want her to come for follow up tomorrow? Or should she restart the b12 injections?  Pt request cb for pt is [phone]

## 2015-12-29 NOTE — Telephone Encounter (Signed)
Pt advise to keep taking OTC b12 and nurse visit and f/u appts rescheduled

## 2015-12-29 NOTE — Telephone Encounter (Signed)
Please re schedule it  I want her to get 4 B12 shots in 4 weeks and follow up at week 5 for visit with me Thanks Continue oral B12 also

## 2015-12-30 ENCOUNTER — Ambulatory Visit: Payer: Self-pay | Admitting: Family Medicine

## 2015-12-31 ENCOUNTER — Ambulatory Visit (INDEPENDENT_AMBULATORY_CARE_PROVIDER_SITE_OTHER): Payer: Managed Care, Other (non HMO)

## 2015-12-31 DIAGNOSIS — E538 Deficiency of other specified B group vitamins: Secondary | ICD-10-CM | POA: Diagnosis not present

## 2015-12-31 MED ORDER — CYANOCOBALAMIN 1000 MCG/ML IJ SOLN
1000.0000 ug | Freq: Once | INTRAMUSCULAR | Status: AC
  Administered 2015-12-31: 1000 ug via INTRAMUSCULAR

## 2016-01-07 ENCOUNTER — Ambulatory Visit: Payer: Managed Care, Other (non HMO)

## 2016-01-08 ENCOUNTER — Ambulatory Visit (INDEPENDENT_AMBULATORY_CARE_PROVIDER_SITE_OTHER): Payer: Managed Care, Other (non HMO) | Admitting: *Deleted

## 2016-01-08 DIAGNOSIS — E538 Deficiency of other specified B group vitamins: Secondary | ICD-10-CM | POA: Diagnosis not present

## 2016-01-08 MED ORDER — CYANOCOBALAMIN 1000 MCG/ML IJ SOLN
1000.0000 ug | Freq: Once | INTRAMUSCULAR | Status: AC
  Administered 2016-01-08: 1000 ug via INTRAMUSCULAR

## 2016-01-14 ENCOUNTER — Ambulatory Visit (INDEPENDENT_AMBULATORY_CARE_PROVIDER_SITE_OTHER): Payer: Managed Care, Other (non HMO) | Admitting: *Deleted

## 2016-01-14 DIAGNOSIS — E538 Deficiency of other specified B group vitamins: Secondary | ICD-10-CM

## 2016-01-14 MED ORDER — CYANOCOBALAMIN 1000 MCG/ML IJ SOLN
1000.0000 ug | Freq: Once | INTRAMUSCULAR | Status: AC
  Administered 2016-01-14: 1000 ug via INTRAMUSCULAR

## 2016-01-27 ENCOUNTER — Ambulatory Visit (INDEPENDENT_AMBULATORY_CARE_PROVIDER_SITE_OTHER): Payer: Managed Care, Other (non HMO) | Admitting: Family Medicine

## 2016-01-27 ENCOUNTER — Encounter: Payer: Self-pay | Admitting: Family Medicine

## 2016-01-27 ENCOUNTER — Other Ambulatory Visit: Payer: Self-pay | Admitting: Family Medicine

## 2016-01-27 VITALS — BP 142/82 | HR 76 | Temp 97.5°F | Ht 61.75 in | Wt 215.0 lb

## 2016-01-27 DIAGNOSIS — I10 Essential (primary) hypertension: Secondary | ICD-10-CM | POA: Diagnosis not present

## 2016-01-27 DIAGNOSIS — E538 Deficiency of other specified B group vitamins: Secondary | ICD-10-CM

## 2016-01-27 DIAGNOSIS — R600 Localized edema: Secondary | ICD-10-CM | POA: Diagnosis not present

## 2016-01-27 LAB — VITAMIN B12: VITAMIN B 12: 543 pg/mL (ref 211–911)

## 2016-01-27 MED ORDER — LISINOPRIL-HYDROCHLOROTHIAZIDE 10-12.5 MG PO TABS: 1.0000 | ORAL_TABLET | Freq: Every day | ORAL | Status: DC

## 2016-01-27 NOTE — Assessment & Plan Note (Signed)
Lab today after 4 B12 shots and daily oral supplement  Feels a little more energy

## 2016-01-27 NOTE — Progress Notes (Signed)
Subjective:    Patient ID: Carol Johnson, female    DOB: 1966-08-28, 50 y.o.   MRN: [id number]  HPI Here for f/u of several chronic health problems   bp is stable today  No cp or palpitations or headaches or edema  No side effects to medicines  BP Readings from Last 3 Encounters:  01/27/16 142/82  12/15/15 130/88  12/01/15 144/96     Last visit was fatigued Low B12 level found Lab Results  Component Value Date   VITAMINB12 208* 12/01/2015  is starting to feel a bit more energy  Will check level today   Ordered 4 shots in 4 weeks   Wt is up 9 lb with bmi of 39 C/o of feet swelling at the end of the day  A month on and off  Takes amlodipine  Salt intake- soup from a can quite a bit  Drinking a lot of water  It really hurts her feet  No cp or sob at all     Chemistry      Component Value Date/Time   NA 140 12/01/2015 0910   K 3.7 12/01/2015 0910   CL 105 12/01/2015 0910   CO2 27 12/01/2015 0910   BUN 10 12/01/2015 0910   CREATININE 0.65 12/01/2015 0910      Component Value Date/Time   CALCIUM 8.7 12/01/2015 0910   ALKPHOS 59 12/01/2015 0910   AST 12 12/01/2015 0910   ALT 15 12/01/2015 0910   BILITOT 0.3 12/01/2015 0910       Patient Active Problem List   Diagnosis Date Noted  . Pedal edema 01/27/2016  . Abdominal bloating 12/01/2015  . Acute sinusitis 12/01/2015  . Fatigue 12/01/2015  . Vitamin B12 deficiency 12/01/2015  . Lymphocytic colitis 01/16/2015  . Anemia, iron deficiency 01/16/2015  . Right leg pain 12/30/2014  . Anxiety state 12/20/2014  . Diarrhea 11/27/2014  . Insomnia 11/27/2014  . Sore throat 09/17/2014  . Left leg DVT ([place]) 09/17/2014  . Sleep disorder 09/02/2014  . Gastritis, chronic 06/06/2014  . Unspecified gastritis and gastroduodenitis without mention of hemorrhage 05/03/2014  . Abdominal pain, epigastric 04/19/2014  . Internal hemorrhoids with other complication 78/29/5621  . Obese 06/27/2013  . Routine general medical  examination at a health care facility 10/14/2011  . Thyroid nodule 05/16/2011  . Rectal bleeding 11/24/2010  . Hemorrhoids, internal 11/24/2010  . Irritable bowel syndrome (IBS) 11/24/2010  . ANEMIA 10/02/2009  . Hyperlipidemia 07/18/2009  . Essential hypertension 07/18/2009  . DYSPNEA ON EXERTION 07/18/2009  . THYROID CYST 01/31/2008  . ANXIETY 01/31/2008  . Depression 01/31/2008  . GASTRIC ULCER, HX OF 01/31/2008  . FIBROIDS, UTERUS 11/13/2007  . ALLERGIC RHINITIS 11/13/2007  . ASTHMA 11/13/2007  . GERD 11/13/2007  . IRRITABLE BOWEL SYNDROME 11/13/2007  . INTERNAL HEMORRHOIDS 11/12/2003   Past Medical History  Diagnosis Date  . Internal hemorrhoid   . GERD (gastroesophageal reflux disease)   . Irritable bowel syndrome   . Fibroid uterus   . Allergic rhinitis   . Hypercholesterolemia   . Asthma   . URI (upper respiratory infection)   . Anxiety   . Depression   . Abdominal pain, unspecified site   . Pneumonia 1998  . Uterine fibroid   . DVT (deep venous thrombosis) ([place]) 2016  . Vitamin B12 deficiency    Past Surgical History  Procedure Laterality Date  . Myomectomy    . Uterine fibroid surgery    . Cholecystectomy  laparoscopic "gallstones"  . Cesarean section  2004  . Esophagogastroduodenoscopy (egd) with propofol N/A 05/03/2014    Procedure: ESOPHAGOGASTRODUODENOSCOPY (EGD) WITH PROPOFOL;  Surgeon: Inda Castle, MD;  Location: [facility];  Service: Endoscopy;  Laterality: N/A;   Social History  Substance Use Topics  . Smoking status: Light Tobacco Smoker  . Smokeless tobacco: Never Used  . Alcohol Use: 6.6 oz/week    1 Standard drinks or equivalent, 10 Cans of beer per week     Comment: occ   Family History  Problem Relation Age of Onset  . Colon cancer Paternal Aunt   . Uterine cancer Paternal Grandmother   . Heart disease Maternal Grandmother   . Heart disease Paternal Uncle   . Heart disease Paternal Aunt   . Kidney disease Cousin     Allergies  Allergen Reactions  . Sertraline Hcl     REACTION: diarrhea  . Zoloft [Sertraline Hcl] Diarrhea   Current Outpatient Prescriptions on File Prior to Visit  Medication Sig Dispense Refill  . buPROPion (WELLBUTRIN XL) 150 MG 24 hr tablet Take 1 tablet (150 mg total) by mouth daily. 30 tablet 11  . escitalopram (LEXAPRO) 10 MG tablet Take 1 tablet (10 mg total) by mouth daily. 30 tablet 11  . ferrous sulfate 325 (65 FE) MG tablet Take 1 tablet (325 mg total) by mouth 2 (two) times daily with a meal. 60 tablet 3  . fluticasone (FLONASE) 50 MCG/ACT nasal spray PLACE 2 SPRAYS INTO BOTH NOSTRILS DAILY. 16 g 1  . pantoprazole (PROTONIX) 40 MG tablet Take 1 tablet (40 mg total) by mouth daily. 30 tablet 11   No current facility-administered medications on file prior to visit.     Review of Systems Review of Systems  Constitutional: Negative for fever, appetite change, fatigue and unexpected weight change.  Eyes: Negative for pain and visual disturbance.  Respiratory: Negative for cough and shortness of breath.   Cardiovascular: Negative for cp or palpitations   pos for pedal edema , neg for PND or orthopnea or dyspnea  Gastrointestinal: Negative for nausea, diarrhea and constipation.  Genitourinary: Negative for urgency and frequency.  Skin: Negative for pallor or rash   Neurological: Negative for weakness, light-headedness, numbness and headaches.  Hematological: Negative for adenopathy. Does not bruise/bleed easily.  Psychiatric/Behavioral: Negative for dysphoric mood. The patient is not nervous/anxious.         Objective:   Physical Exam  Constitutional: She appears well-developed and well-nourished. No distress.  obese and well appearing   HENT:  Head: Normocephalic and atraumatic.  Mouth/Throat: Oropharynx is clear and moist.  Eyes: Conjunctivae and EOM are normal. Pupils are equal, round, and reactive to light.  Neck: Normal range of motion. Neck supple. No JVD  present. Carotid bruit is not present. No thyromegaly present.  Cardiovascular: Normal rate, regular rhythm, normal heart sounds and intact distal pulses.  Exam reveals no gallop.   Pulmonary/Chest: Effort normal and breath sounds normal. No respiratory distress. She has no wheezes. She has no rales.  No crackles  Abdominal: Soft. Bowel sounds are normal. She exhibits no distension, no abdominal bruit and no mass. There is no tenderness.  Musculoskeletal: She exhibits edema.  One plus pedal edema bilaterally  No tenderness   Lymphadenopathy:    She has no cervical adenopathy.  Neurological: She is alert. She has normal reflexes.  Skin: Skin is warm and dry. No rash noted. No erythema. No pallor.  Psychiatric: She has a normal mood and  affect.          Assessment & Plan:   Problem List Items Addressed This Visit      Cardiovascular and Mediastinum   Essential hypertension - Primary    Pt is having less controlled bp as well as pedal edema D/c amlodipine Start lisinopril hct 10-12.5 daily  F/u 2-3 wk Update if side eff  Will do labs at f/u      Relevant Medications   lisinopril-hydrochlorothiazide (PRINZIDE,ZESTORETIC) 10-12.5 MG tablet     Digestive   Vitamin B12 deficiency    Lab today after 4 B12 shots and daily oral supplement  Feels a little more energy      Relevant Orders   Vitamin B12 (Completed)     Other   Pedal edema    Suspect multifactorial Has been on a steroid med for colitis (now off of it) Also amlodipine/ hot weather and wt gain  Will change amlodipine to lisinopril hct  Elevate legs/ lower sodium diet/good fluid intake and walking recommended  F/u 2-3 wk No cardiac symptoms

## 2016-01-27 NOTE — Telephone Encounter (Signed)
Rx called in as prescribed 

## 2016-01-27 NOTE — Telephone Encounter (Signed)
Pt just had her f/u appt today, last filled on 07/18/15 #30 with 3 additional refills, please advise

## 2016-01-27 NOTE — Telephone Encounter (Signed)
Px written for call in   

## 2016-01-27 NOTE — Assessment & Plan Note (Signed)
Suspect multifactorial Has been on a steroid med for colitis (now off of it) Also amlodipine/ hot weather and wt gain  Will change amlodipine to lisinopril hct  Elevate legs/ lower sodium diet/good fluid intake and walking recommended  F/u 2-3 wk No cardiac symptoms

## 2016-01-27 NOTE — Progress Notes (Signed)
Pre visit review using our clinic review tool, if applicable. No additional management support is needed unless otherwise documented below in the visit note. 

## 2016-01-27 NOTE — Patient Instructions (Addendum)
Hold your amlodipine and take lisinopril hct for both blood pressure and swelling If any intolerable side effects or low blood pressure  Drink water  Avoid sodium and processed foods when able When sitting - elevate feet whenever possible  Try to get some regular exercise - some walking  Follow up with me in 2-3 weeks approx  Labs today for B12  Continue your oral B12

## 2016-01-27 NOTE — Assessment & Plan Note (Signed)
Pt is having less controlled bp as well as pedal edema D/c amlodipine Start lisinopril hct 10-12.5 daily  F/u 2-3 wk Update if side eff  Will do labs at f/u

## 2016-01-28 ENCOUNTER — Encounter: Payer: Self-pay | Admitting: *Deleted

## 2016-02-13 ENCOUNTER — Ambulatory Visit (INDEPENDENT_AMBULATORY_CARE_PROVIDER_SITE_OTHER): Payer: Managed Care, Other (non HMO) | Admitting: Gastroenterology

## 2016-02-13 ENCOUNTER — Other Ambulatory Visit (INDEPENDENT_AMBULATORY_CARE_PROVIDER_SITE_OTHER): Payer: Managed Care, Other (non HMO)

## 2016-02-13 ENCOUNTER — Encounter: Payer: Self-pay | Admitting: Gastroenterology

## 2016-02-13 VITALS — BP 124/90 | HR 80 | Ht 61.75 in | Wt 211.2 lb

## 2016-02-13 DIAGNOSIS — R197 Diarrhea, unspecified: Secondary | ICD-10-CM

## 2016-02-13 DIAGNOSIS — K589 Irritable bowel syndrome without diarrhea: Secondary | ICD-10-CM | POA: Diagnosis not present

## 2016-02-13 LAB — CBC WITH DIFFERENTIAL/PLATELET
BASOS PCT: 0.4 % (ref 0.0–3.0)
Basophils Absolute: 0 10*3/uL (ref 0.0–0.1)
EOS PCT: 7.8 % — AB (ref 0.0–5.0)
Eosinophils Absolute: 0.6 10*3/uL (ref 0.0–0.7)
HCT: 40.4 % (ref 36.0–46.0)
Hemoglobin: 13.5 g/dL (ref 12.0–15.0)
LYMPHS ABS: 1.8 10*3/uL (ref 0.7–4.0)
Lymphocytes Relative: 22.1 % (ref 12.0–46.0)
MCHC: 33.4 g/dL (ref 30.0–36.0)
MCV: 88 fl (ref 78.0–100.0)
MONO ABS: 0.6 10*3/uL (ref 0.1–1.0)
Monocytes Relative: 7.5 % (ref 3.0–12.0)
NEUTROS PCT: 62.2 % (ref 43.0–77.0)
Neutro Abs: 5.1 10*3/uL (ref 1.4–7.7)
Platelets: 348 10*3/uL (ref 150.0–400.0)
RBC: 4.59 Mil/uL (ref 3.87–5.11)
RDW: 13.7 % (ref 11.5–15.5)
WBC: 8.3 10*3/uL (ref 4.0–10.5)

## 2016-02-13 LAB — COMPREHENSIVE METABOLIC PANEL
ALT: 23 U/L (ref 0–35)
AST: 17 U/L (ref 0–37)
Albumin: 4 g/dL (ref 3.5–5.2)
Alkaline Phosphatase: 67 U/L (ref 39–117)
BUN: 17 mg/dL (ref 6–23)
CHLORIDE: 102 meq/L (ref 96–112)
CO2: 28 mEq/L (ref 19–32)
Calcium: 9.6 mg/dL (ref 8.4–10.5)
Creatinine, Ser: 0.81 mg/dL (ref 0.40–1.20)
GFR: 79.12 mL/min (ref >60.00)
GLUCOSE: 100 mg/dL — AB (ref 70–99)
POTASSIUM: 4.2 meq/L (ref 3.5–5.1)
Sodium: 138 mEq/L (ref 135–145)
Total Bilirubin: 0.4 mg/dL (ref 0.2–1.2)
Total Protein: 6.9 g/dL (ref 6.0–8.3)

## 2016-02-13 NOTE — Progress Notes (Signed)
Carol Johnson    [id number]    October 05, 1965  Primary Care Physician:Marne Tower, MD  Referring Physician: Abner Greenspan, MD [address] [place], [place] [address]  Chief complaint: Diarrhea HPI: 50 year old female with history of lymphocytic colitis diagnosed in May 2016 status post therapy with budesonide 9 mg daily for about a year, which was tapered off in April 2017 when patient was seen in the office. Patient said she felt good with normal bowel movements after she came off budesonide and avoided lactose, artificial sweeteners or high fructose drinks. She was started on lisinopril and hydrochlorothiazide by Dr. Glori Bickers 3 weeks ago and for the past 2 weeks she feels she is getting recurrent episodes of diarrhea with increased bowel frequency 4-5 times a day with semi-formed stool . Denies any nausea, vomiting, abdominal pain, melena or bright red blood per rectum. Denies taking NSAID's.   Outpatient Encounter Prescriptions as of 02/13/2016  Medication Sig  . buPROPion (WELLBUTRIN XL) 150 MG 24 hr tablet Take 1 tablet (150 mg total) by mouth daily.  Marland Kitchen escitalopram (LEXAPRO) 10 MG tablet Take 1 tablet (10 mg total) by mouth daily.  . ferrous sulfate 325 (65 FE) MG tablet Take 1 tablet (325 mg total) by mouth 2 (two) times daily with a meal.  . fluticasone (FLONASE) 50 MCG/ACT nasal spray PLACE 2 SPRAYS INTO BOTH NOSTRILS DAILY. (Patient taking differently: PLACE 2 SPRAYS INTO BOTH NOSTRILS AS NEEDED)  . LIALDA 1.2 g EC tablet TAKE 2 TABLETS BY MOUTH ONCE DAILY WITH BREAKFAST  . lisinopril-hydrochlorothiazide (PRINZIDE,ZESTORETIC) 10-12.5 MG tablet Take 1 tablet by mouth daily. In am  . Multiple Vitamin (MULTIVITAMIN) tablet Take 1 tablet by mouth daily.  . pantoprazole (PROTONIX) 40 MG tablet Take 1 tablet (40 mg total) by mouth daily.  . Probiotic Product (ALIGN) 4 MG CAPS Take 1 capsule by mouth daily.  . vitamin B-12 (CYANOCOBALAMIN) 1000 MCG  tablet Take 1,000 mcg by mouth daily.  Marland Kitchen zolpidem (AMBIEN CR) 12.5 MG CR tablet TAKE 1 TABLET BY MOUTH AT BEDTIME AS NEEDED FOR SLEEP   No facility-administered encounter medications on file as of 02/13/2016.    Allergies as of 02/13/2016 - Review Complete 02/13/2016  Allergen Reaction Noted  . Sertraline hcl  01/31/2008  . Zoloft [sertraline hcl] Diarrhea 04/19/2014    Past Medical History  Diagnosis Date  . Internal hemorrhoid   . GERD (gastroesophageal reflux disease)   . Irritable bowel syndrome   . Fibroid uterus   . Allergic rhinitis   . Hypercholesterolemia   . Asthma   . URI (upper respiratory infection)   . Anxiety   . Depression   . Abdominal pain, unspecified site   . Pneumonia 1998  . Uterine fibroid   . DVT (deep venous thrombosis) ([place]) 2016  . Vitamin B12 deficiency     Past Surgical History  Procedure Laterality Date  . Myomectomy    . Uterine fibroid surgery    . Cholecystectomy      laparoscopic "gallstones"  . Cesarean section  2004  . Esophagogastroduodenoscopy (egd) with propofol N/A 05/03/2014    Procedure: ESOPHAGOGASTRODUODENOSCOPY (EGD) WITH PROPOFOL;  Surgeon: Inda Castle, MD;  Location: [facility];  Service: Endoscopy;  Laterality: N/A;    Family History  Problem Relation Age of Onset  . Colon cancer Paternal Aunt   . Uterine cancer Paternal Grandmother   . Heart disease Maternal Grandmother   .  Heart disease Paternal Uncle   . Heart disease Paternal Aunt   . Kidney disease Cousin     Social History   Social History  . Marital Status: Married    Spouse Name: N/A  . Number of Children: 1  . Years of Education: N/A   Occupational History  . Admin. assistant    Social History Main Topics  . Smoking status: Light Tobacco Smoker  . Smokeless tobacco: Never Used  . Alcohol Use: 6.6 oz/week    1 Standard drinks or equivalent, 10 Cans of beer per week     Comment: occ  . Drug Use: No  . Sexual Activity: Yes    Birth Control/  Protection: Pill   Other Topics Concern  . Not on file   Social History Narrative      Review of systems: Review of Systems  Constitutional: Negative for fever and chills.  HENT: Negative.   Eyes: Negative for blurred vision.  Respiratory: Negative for cough, shortness of breath and wheezing.   Cardiovascular: Negative for chest pain and palpitations.  Gastrointestinal: as per HPI Genitourinary: Negative for dysuria, urgency, frequency and hematuria.  Musculoskeletal: Negative for myalgias, back pain and joint pain.  Skin: Negative for itching and rash.  Neurological: Negative for dizziness, tremors, focal weakness, seizures and loss of consciousness.  Endo/Heme/Allergies: Negative for environmental allergies.  Psychiatric/Behavioral: Negative for depression, suicidal ideas and hallucinations.  All other systems reviewed and are negative.   Physical Exam: Filed Vitals:   02/13/16 0850  BP: 124/90  Pulse: 80   Gen:      No acute distress HEENT:  EOMI, sclera anicteric Neck:     No masses; no thyromegaly Lungs:    Clear to auscultation bilaterally; normal respiratory effort CV:         Regular rate and rhythm; no murmurs Abd:      + bowel sounds; soft, non-tender; no palpable masses, no distension Ext:   Bilateral trace  edema; adequate peripheral perfusion Skin:      Warm and dry; no rash Neuro: alert and oriented x 3 Psych: normal mood and affect  Data Reviewed:  Reviewed chart in epic  Assessment and Plan/Recommendations:  50 year old female with history of microscopic colitis (lymphocytic) and irritable bowel syndrome here for follow-up visit Patient reports recurrence of symptoms in the past 2 weeks since she started new blood pressure medication She continues to drink soda and chew gum and is also doing small amount of milk with cereal Advised her to avoid lactose products, artificial sweeteners and soda Start probiotic VSL#3 112B units 1 capsule daily We'll  reassess in 1 month, she continues to have significant diarrhea will schedule for colonoscopy to evaluate for possible persistent microscopic colitis   K. Denzil Magnuson , MD [phone] Mon-Fri 8a-5p [phone] after 5p, weekends, holidays  CC: Tower, Wynelle Fanny, MD   10

## 2016-02-13 NOTE — Patient Instructions (Signed)
Go to the basement for labs today Use VSL # 3 112 BU daily, this can be purchased over the counter at your pharmacy Call in 1 month to follow up on symptoms, if no improvements we will consider schedule ing you for a colonoscopy

## 2016-02-17 ENCOUNTER — Ambulatory Visit: Payer: Self-pay | Admitting: Family Medicine

## 2016-02-27 ENCOUNTER — Telehealth: Payer: Self-pay | Admitting: Gastroenterology

## 2016-02-27 NOTE — Telephone Encounter (Signed)
She was seen on 02/13/16. She is to take the VSL#3 1 capsule daily. Called patient back, but got her voicemail. I left her the instructions to take 1 capsule daily.

## 2016-04-06 ENCOUNTER — Telehealth: Payer: Self-pay | Admitting: Gastroenterology

## 2016-04-06 NOTE — Telephone Encounter (Signed)
Doc of the Day Dr Woodward Ku patient calling with a week of increased BM that are watery and urgent. Reports 10 to 12 a day. She restarted Lialda 3 days ago at 4.8 g per day. She states maybe some improvement with Lialda, yet she also states the symptoms are making it difficult to work. Her lower abdomen has tenderness. She sees what she thinks is blood with the bm's. Please advise.

## 2016-04-06 NOTE — Telephone Encounter (Signed)
Patient agrees to this plan. She asks if she should continue the Steele. She asks for a note for work for today. Please advise. Thanks

## 2016-04-06 NOTE — Telephone Encounter (Signed)
2 imodium tablets up to three times day as needed.  If that doe snot help, let me know and I can leave a Rx for Lomotil (must be printed and picked up at office).  I read Dr Woodward Ku note.  Sounds like she was considering need for repeat colonoscopy if symptoms persisted.  So please leave this in review for her.

## 2016-04-06 NOTE — Telephone Encounter (Signed)
Yes lialda Sure, please send her a note for work today

## 2016-04-07 NOTE — Telephone Encounter (Signed)
Patient is advised.  

## 2016-04-20 ENCOUNTER — Telehealth: Payer: Self-pay | Admitting: Gastroenterology

## 2016-04-20 NOTE — Telephone Encounter (Signed)
Please refer to the note of 04/06/16

## 2016-04-20 NOTE — Telephone Encounter (Signed)
Please review and advise. Spoke with the patient today. She feels she is improving on Lialda. She is unsure how long she is to continue with Imodium. Diarrhea is controlled with 2 Imodium daily.

## 2016-04-22 ENCOUNTER — Other Ambulatory Visit: Payer: Self-pay

## 2016-04-22 DIAGNOSIS — R197 Diarrhea, unspecified: Secondary | ICD-10-CM

## 2016-04-22 DIAGNOSIS — K625 Hemorrhage of anus and rectum: Secondary | ICD-10-CM

## 2016-04-22 NOTE — Telephone Encounter (Signed)
Patient is calling back regarding this.   She also states that she is home sick. She says that she isn't feeling well and has been having rectal bleeding. Best # [phone]

## 2016-04-22 NOTE — Telephone Encounter (Signed)
Please review the note from 04/06/16. Today Carol Johnson calls stating she has had bleeding off and on with bowel movements. She is having up to 5 soft to liquid bowel movements per day. Her stomach feels bloated. She has stayed home all week from work because she is not comfortable having these bowel movements at work. She is still taking Lialda and Imodium. I have her on the schedule with Amy on Monday which is the first available. Please advise.

## 2016-04-22 NOTE — Telephone Encounter (Signed)
Patient is instructed. Orders are entered. Appointment is scheduled.

## 2016-04-22 NOTE — Telephone Encounter (Signed)
Please advise patient to do C.diff, fecal lactoferrin and check CBC, CMP and CRP. Stop Lialda . She may have a recurrent episode of lymphocytic colitis. Will reassess in office visit if she needs repeat colonoscopy

## 2016-04-23 ENCOUNTER — Encounter: Payer: Self-pay | Admitting: *Deleted

## 2016-04-23 ENCOUNTER — Other Ambulatory Visit (INDEPENDENT_AMBULATORY_CARE_PROVIDER_SITE_OTHER): Payer: Managed Care, Other (non HMO)

## 2016-04-23 DIAGNOSIS — R197 Diarrhea, unspecified: Secondary | ICD-10-CM

## 2016-04-23 DIAGNOSIS — K625 Hemorrhage of anus and rectum: Secondary | ICD-10-CM | POA: Diagnosis not present

## 2016-04-23 LAB — COMPREHENSIVE METABOLIC PANEL
ALK PHOS: 63 U/L (ref 39–117)
ALT: 41 U/L — ABNORMAL HIGH (ref 0–35)
AST: 26 U/L (ref 0–37)
Albumin: 4 g/dL (ref 3.5–5.2)
BUN: 10 mg/dL (ref 6–23)
CALCIUM: 9.3 mg/dL (ref 8.4–10.5)
CO2: 28 mEq/L (ref 19–32)
Chloride: 102 mEq/L (ref 96–112)
Creatinine, Ser: 0.79 mg/dL (ref 0.40–1.20)
GFR: 81.37 mL/min (ref >60.00)
GLUCOSE: 93 mg/dL (ref 70–99)
POTASSIUM: 4.1 meq/L (ref 3.5–5.1)
Sodium: 139 mEq/L (ref 135–145)
TOTAL PROTEIN: 6.9 g/dL (ref 6.0–8.3)
Total Bilirubin: 0.5 mg/dL (ref 0.2–1.2)

## 2016-04-23 LAB — CBC WITH DIFFERENTIAL/PLATELET
Basophils Absolute: 0 10*3/uL (ref 0.0–0.1)
Basophils Relative: 0.5 % (ref 0.0–3.0)
EOS PCT: 6.4 % — AB (ref 0.0–5.0)
Eosinophils Absolute: 0.6 10*3/uL (ref 0.0–0.7)
HEMATOCRIT: 39.6 % (ref 36.0–46.0)
HEMOGLOBIN: 13.5 g/dL (ref 12.0–15.0)
LYMPHS ABS: 2.4 10*3/uL (ref 0.7–4.0)
LYMPHS PCT: 24.6 % (ref 12.0–46.0)
MCHC: 34 g/dL (ref 30.0–36.0)
MCV: 88.5 fl (ref 78.0–100.0)
MONOS PCT: 6 % (ref 3.0–12.0)
Monocytes Absolute: 0.6 10*3/uL (ref 0.1–1.0)
Neutro Abs: 6 10*3/uL (ref 1.4–7.7)
Neutrophils Relative %: 62.5 % (ref 43.0–77.0)
Platelets: 345 10*3/uL (ref 150.0–400.0)
RBC: 4.48 Mil/uL (ref 3.87–5.11)
RDW: 13.8 % (ref 11.5–15.5)
WBC: 9.6 10*3/uL (ref 4.0–10.5)

## 2016-04-23 LAB — C-REACTIVE PROTEIN: CRP: 1 mg/dL (ref 0.5–20.0)

## 2016-04-24 LAB — CLOSTRIDIUM DIFFICILE BY PCR: Toxigenic C. Difficile by PCR: NOT DETECTED

## 2016-04-26 ENCOUNTER — Ambulatory Visit (INDEPENDENT_AMBULATORY_CARE_PROVIDER_SITE_OTHER)
Admission: RE | Admit: 2016-04-26 | Discharge: 2016-04-26 | Disposition: A | Discharge status: Home / Self Care | Payer: Managed Care, Other (non HMO) | Source: Ambulatory Visit | Attending: Physician Assistant | Admitting: Physician Assistant

## 2016-04-26 ENCOUNTER — Encounter: Payer: Self-pay | Admitting: Physician Assistant

## 2016-04-26 ENCOUNTER — Ambulatory Visit (INDEPENDENT_AMBULATORY_CARE_PROVIDER_SITE_OTHER): Payer: Managed Care, Other (non HMO) | Admitting: Physician Assistant

## 2016-04-26 VITALS — BP 112/74 | HR 96 | Ht 62.0 in | Wt 216.4 lb

## 2016-04-26 DIAGNOSIS — R059 Cough, unspecified: Secondary | ICD-10-CM

## 2016-04-26 DIAGNOSIS — R05 Cough: Secondary | ICD-10-CM

## 2016-04-26 DIAGNOSIS — K52832 Lymphocytic colitis: Secondary | ICD-10-CM | POA: Diagnosis not present

## 2016-04-26 DIAGNOSIS — R197 Diarrhea, unspecified: Secondary | ICD-10-CM | POA: Diagnosis not present

## 2016-04-26 LAB — FECAL LACTOFERRIN, QUANT: LACTOFERRIN: NEGATIVE

## 2016-04-26 MED ORDER — DICYCLOMINE HCL 10 MG PO CAPS: ORAL_CAPSULE | ORAL | 0 refills | Status: DC

## 2016-04-26 MED ORDER — HYDROCORTISONE ACETATE 25 MG RE SUPP: RECTAL | 1 refills | Status: DC

## 2016-04-26 MED ORDER — MESALAMINE 1.2 G PO TBEC: DELAYED_RELEASE_TABLET | ORAL | 6 refills | Status: DC

## 2016-04-26 NOTE — Patient Instructions (Addendum)
Please go to the basement level to our radiology department for a chest x-ray.  Get Zantac 75 mg over the counter and take 1 tab at bedtime.  Continue the Protonix 40 mg on every morning.   We sent prescriptions tp [facility] [address]   1. Lialda 1.2 G. 2. Bentyl ( Dicyclomine) 10 mg.  3.  Anusol HC suppositories.   We made you an appointment with Dr. Silverio Decamp for 07-08-2016 at 9:45 am.

## 2016-04-26 NOTE — Progress Notes (Signed)
Subjective:    Patient ID: Carol Johnson, female    DOB: 1965/09/08, 50 y.o.   MRN: [id number]  HPI Asjah is a 50 year old white female established with Dr. Silverio Decamp. She has a diagnosis of lymphocytic colitis made in May 2016 at the time of colonoscopy with biopsies per Dr. Deatra Ina. Patient had been treated with Lialda in the past with good response. Last winter when she had an exacerbation she was given a course of budesonide but did not like the way it made her feel and also gained a significant amount of weight around that time. She does not want to be treated with steroids or budesonide. At the time of her last office visit in June 2017 she had recently had a mild flare of her symptoms. BSL 3 was started and she was asked to avoid artificial sweeteners and milk and to avoid sodas. She comes back in today stating that her diarrhea has been much worse over the past month and she's having up to 12 bowel movements per day. She says frequently her stools are very small volume and liquidy but very urgent. She is afraid of having accidents all the time. She says she's having a hard time working because of urgency and diarrhea. She's also been very fatigued because she is not sleeping well at night. She's been waking up frequently with coughing over the past few weeks. Does have history of asthma but wonders if this is reflux related. She is on chronic Protonix 40 mg by mouth daily. She was out of work last week because of the severity of the diarrhea. She called here and was asked to do a CBC which was normal and a stool for C. difficile PCR which is negative CRP is 1, lactoferrin  is pending. She says she gets some cramping and lower abdominal discomfort appetite has been okay occasionally has some nausea. She does intermittently see blood in the commode, and blood on the stool. He says sometimes she has some dripping of blood after a bowel movement. Line She did have internal hemorrhoids documented at the  time for colonoscopy in May 2016. She is frustrated today and says that a "holistic" approach is not going to work for her diarrheal sxs.  Review of Systems Pertinent positive and negative review of systems were noted in the above HPI section.  All other review of systems was otherwise negative.  Outpatient Encounter Prescriptions as of 04/26/2016  Medication Sig  . buPROPion (WELLBUTRIN XL) 150 MG 24 hr tablet Take 1 tablet (150 mg total) by mouth daily.  Marland Kitchen escitalopram (LEXAPRO) 10 MG tablet Take 1 tablet (10 mg total) by mouth daily.  . ferrous sulfate 325 (65 FE) MG tablet Take 1 tablet (325 mg total) by mouth 2 (two) times daily with a meal.  . fluticasone (FLONASE) 50 MCG/ACT nasal spray PLACE 2 SPRAYS INTO BOTH NOSTRILS DAILY. (Patient taking differently: PLACE 2 SPRAYS INTO BOTH NOSTRILS AS NEEDED)  . lisinopril-hydrochlorothiazide (PRINZIDE,ZESTORETIC) 10-12.5 MG tablet Take 1 tablet by mouth daily. In am  . Multiple Vitamin (MULTIVITAMIN) tablet Take 1 tablet by mouth daily.  . pantoprazole (PROTONIX) 40 MG tablet Take 1 tablet (40 mg total) by mouth daily.  . Probiotic Product (VSL#3 DS PO) Take by mouth.  . vitamin B-12 (CYANOCOBALAMIN) 1000 MCG tablet Take 1,000 mcg by mouth daily.  Marland Kitchen zolpidem (AMBIEN CR) 12.5 MG CR tablet TAKE 1 TABLET BY MOUTH AT BEDTIME AS NEEDED FOR SLEEP  . dicyclomine (BENTYL) 10 MG  capsule Take 1 tab twice daily.  . hydrocortisone (ANUSOL-HC) 25 MG suppository Take 1 suppository at bedtime x 14 days.  . mesalamine (LIALDA) 1.2 g EC tablet Take 2 tablets twice daily.  . [DISCONTINUED] Probiotic Product (ALIGN) 4 MG CAPS Take 1 capsule by mouth daily.   No facility-administered encounter medications on file as of 04/26/2016.    Allergies  Allergen Reactions  . Sertraline Hcl     REACTION: diarrhea  . Zoloft [Sertraline Hcl] Diarrhea   Patient Active Problem List   Diagnosis Date Noted  . Pedal edema 01/27/2016  . Abdominal bloating 12/01/2015  .  Acute sinusitis 12/01/2015  . Fatigue 12/01/2015  . Vitamin B12 deficiency 12/01/2015  . Lymphocytic colitis 01/16/2015  . Anemia, iron deficiency 01/16/2015  . Right leg pain 12/30/2014  . Anxiety state 12/20/2014  . Diarrhea 11/27/2014  . Insomnia 11/27/2014  . Sore throat 09/17/2014  . Left leg DVT ([place]) 09/17/2014  . Sleep disorder 09/02/2014  . Gastritis, chronic 06/06/2014  . Unspecified gastritis and gastroduodenitis without mention of hemorrhage 05/03/2014  . Abdominal pain, epigastric 04/19/2014  . Internal hemorrhoids with other complication 28/76/8115  . Obese 06/27/2013  . Routine general medical examination at a health care facility 10/14/2011  . Thyroid nodule 05/16/2011  . Rectal bleeding 11/24/2010  . Hemorrhoids, internal 11/24/2010  . Irritable bowel syndrome (IBS) 11/24/2010  . ANEMIA 10/02/2009  . Hyperlipidemia 07/18/2009  . Essential hypertension 07/18/2009  . DYSPNEA ON EXERTION 07/18/2009  . THYROID CYST 01/31/2008  . ANXIETY 01/31/2008  . Depression 01/31/2008  . GASTRIC ULCER, HX OF 01/31/2008  . FIBROIDS, UTERUS 11/13/2007  . ALLERGIC RHINITIS 11/13/2007  . ASTHMA 11/13/2007  . GERD 11/13/2007  . IRRITABLE BOWEL SYNDROME 11/13/2007  . INTERNAL HEMORRHOIDS 11/12/2003   Social History   Social History  . Marital status: Married    Spouse name: N/A  . Number of children: 1  . Years of education: N/A   Occupational History  . Admin. assistant    Social History Main Topics  . Smoking status: Light Tobacco Smoker    Types: Cigarettes  . Smokeless tobacco: Never Used  . Alcohol use 6.6 oz/week    10 Cans of beer, 1 Standard drinks or equivalent per week     Comment: occ  . Drug use: No  . Sexual activity: Yes    Birth control/ protection: Pill   Other Topics Concern  . Not on file   Social History Narrative  . No narrative on file    Ms. Sen's family history includes Colon cancer in her paternal aunt; Heart disease in her  maternal grandmother, paternal aunt, and paternal uncle; Kidney disease in her cousin; Uterine cancer in her paternal grandmother.      Objective:    Vitals:   04/26/16 1431  BP: 112/74  Pulse: 96    Physical Exam  well-developed white female in no acute distress, blood pressure 112/74 pulse 96, height 5 foot 2 weight 216 BMI of 39.5. HEENT; nontraumatic, cephalic EOMI PERRLA sclera anicteric, Cardiovascular ;regular rate and rhythm with S1-S2 no murmur or gallop, Pulmonary; clear bilaterally Abdomen; large soft basically nontender there is no palpable mass or hepatosplenomegaly, bowel sounds present, Rectal ;exam not done, Neuropsych; mood and affect appropriate       Assessment & Plan:   #24  50 year old female with diagnosis of lymphocytic colitis, intolerant to budesonide with exacerbation over the past month with multiple diarrheal stools per day urgency and mild cramping. #2  intermittent small-volume hematochezia-most likely secondary to internal hemorrhoids worse when diarrhea is active #3 chronic GERD #4 cough-frequent nighttime symptoms over the past few weeks, rule out component secondary to reflux, though patient does have history of asthma  Plan; She will continue the VSL #3 one daily Add trial of Bentyl 10 mg by mouth twice a day to 3 times a day as needed Pt was given a note for work to cover last week and most of this week We'll give her a trial of Lialda 2.4 g by mouth twice a day as she found this very effective in the past She will continue to avoid milk, sodas and artificial sweeteners and NSAIDs Check chest x-ray PA and lateral Antireflux regimen including elevation of head of the bed 45 and nothing by mouth for 2-3 hours before bedtime Patient will be made a follow-up appointment with Dr. Silverio Decamp in 4-6 weeks   Yoshi Mancillas Genia Harold PA-C 04/26/2016   Cc: Tower, Wynelle Fanny, MD

## 2016-04-27 ENCOUNTER — Telehealth: Payer: Self-pay | Admitting: Physician Assistant

## 2016-04-29 NOTE — Progress Notes (Signed)
Reviewed and agree with documentation and assessment and plan. K. Veena Nandigam , MD   

## 2016-04-30 ENCOUNTER — Encounter: Payer: Self-pay | Admitting: Family Medicine

## 2016-04-30 ENCOUNTER — Ambulatory Visit (INDEPENDENT_AMBULATORY_CARE_PROVIDER_SITE_OTHER): Payer: Managed Care, Other (non HMO) | Admitting: Family Medicine

## 2016-04-30 VITALS — BP 108/66 | HR 79 | Temp 98.3°F | Ht 62.0 in | Wt 222.2 lb

## 2016-04-30 DIAGNOSIS — R059 Cough, unspecified: Secondary | ICD-10-CM

## 2016-04-30 DIAGNOSIS — I10 Essential (primary) hypertension: Secondary | ICD-10-CM | POA: Diagnosis not present

## 2016-04-30 DIAGNOSIS — R05 Cough: Secondary | ICD-10-CM

## 2016-04-30 MED ORDER — LOSARTAN POTASSIUM-HCTZ 50-12.5 MG PO TABS: 1.0000 | ORAL_TABLET | Freq: Every day | ORAL | 11 refills | Status: DC

## 2016-04-30 NOTE — Patient Instructions (Signed)
I think your cough was from lisinopril  Change from lisinopril hct (stop it) to losartan hct one pill daily  If no improvement in cough in 1-2 weeks alert me  If other symptoms - fever / sinus pain - if you feel sick, let me know  Follow up in 1-2 months

## 2016-04-30 NOTE — Progress Notes (Signed)
Pre visit review using our clinic review tool, if applicable. No additional management support is needed unless otherwise documented below in the visit note. 

## 2016-04-30 NOTE — Progress Notes (Signed)
Subjective:    Patient ID: Carol Johnson, female    DOB: [birth year], 50 y.o.   MRN: [id number]  HPI Pt is here for cough ? If from bp medicine  Worse when she lies down  Very persistent - usually hacking , occ a little productive   Feels like it is her throat   Perhaps a little congestion and facial pressure     She takes protonix 40 mg  She is on an ace    Dg Chest 2 View  Result Date: 04/26/2016 CLINICAL DATA:  Cough. EXAM: CHEST  2 VIEW COMPARISON:  11/17/2015.  CT 03/03/2004 FINDINGS: Mild cardiomegaly and pulmonary vascular prominence. No focal infiltrate. No pleural effusion or pneumothorax. No acute bony abnormality. IMPRESSION: Mild cardiomegaly with mild pulmonary vascular prominence. Exam otherwise negative . Electronically Signed   By: Marcello Moores  Register   On: 04/26/2016 16:28    She has not had any sob on exertion  No cp    Wt Readings from Last 3 Encounters:  04/30/16 222 lb 4 oz (100.8 kg)  04/26/16 216 lb 6 oz (98.1 kg)  02/13/16 211 lb 4 oz (95.8 kg)   bmi is 40.6  Smoking status   BP Readings from Last 3 Encounters:  04/30/16 108/66  04/26/16 112/74  02/13/16 124/90     Patient Active Problem List   Diagnosis Date Noted  . Renal insufficiency 05/02/2016  . Pedal edema 01/27/2016  . Abdominal bloating 12/01/2015  . Fatigue 12/01/2015  . Vitamin B12 deficiency 12/01/2015  . Lymphocytic colitis 01/16/2015  . Anemia, iron deficiency 01/16/2015  . Right leg pain 12/30/2014  . Anxiety state 12/20/2014  . Diarrhea 11/27/2014  . Insomnia 11/27/2014  . Sore throat 09/17/2014  . Left leg DVT ([place]) 09/17/2014  . Sleep disorder 09/02/2014  . Gastritis, chronic 06/06/2014  . Unspecified gastritis and gastroduodenitis without mention of hemorrhage 05/03/2014  . Abdominal pain, epigastric 04/19/2014  . Internal hemorrhoids with other complication 77/41/2878  . Obese 06/27/2013  . Routine general medical examination at a health care facility  10/14/2011  . Thyroid nodule 05/16/2011  . Rectal bleeding 11/24/2010  . Hemorrhoids, internal 11/24/2010  . Irritable bowel syndrome (IBS) 11/24/2010  . ANEMIA 10/02/2009  . Hyperlipidemia 07/18/2009  . Essential hypertension 07/18/2009  . DYSPNEA ON EXERTION 07/18/2009  . THYROID CYST 01/31/2008  . ANXIETY 01/31/2008  . Depression 01/31/2008  . GASTRIC ULCER, HX OF 01/31/2008  . FIBROIDS, UTERUS 11/13/2007  . ALLERGIC RHINITIS 11/13/2007  . ASTHMA 11/13/2007  . GERD 11/13/2007  . IRRITABLE BOWEL SYNDROME 11/13/2007  . INTERNAL HEMORRHOIDS 11/12/2003   Past Medical History:  Diagnosis Date  . Abdominal pain, unspecified site   . Allergic rhinitis   . Anxiety   . Asthma   . Depression   . DVT (deep venous thrombosis) ([place]) 2016  . Fibroid uterus   . GERD (gastroesophageal reflux disease)   . Hypercholesterolemia   . Internal hemorrhoid   . Irritable bowel syndrome   . Lymphocytic colitis 01/02/2015  . Pneumonia 1998  . URI (upper respiratory infection)   . Uterine fibroid   . Vitamin B12 deficiency    Past Surgical History:  Procedure Laterality Date  . CESAREAN SECTION  2004  . CHOLECYSTECTOMY     laparoscopic "gallstones"  . ESOPHAGOGASTRODUODENOSCOPY (EGD) WITH PROPOFOL N/A 05/03/2014   Procedure: ESOPHAGOGASTRODUODENOSCOPY (EGD) WITH PROPOFOL;  Surgeon: Inda Castle, MD;  Location: [facility];  Service: Endoscopy;  Laterality: N/A;  . MYOMECTOMY    .  UTERINE FIBROID SURGERY     Social History  Substance Use Topics  . Smoking status: Light Tobacco Smoker    Types: Cigarettes  . Smokeless tobacco: Never Used  . Alcohol use 6.6 oz/week    10 Cans of beer, 1 Standard drinks or equivalent per week     Comment: occ   Family History  Problem Relation Age of Onset  . Colon cancer Paternal Aunt   . Uterine cancer Paternal Grandmother     with mets to the colon   . Heart disease Maternal Grandmother   . Heart disease Paternal Uncle   . Heart disease  Paternal Aunt   . Kidney disease Cousin    Allergies  Allergen Reactions  . Sertraline Hcl     REACTION: diarrhea  . Zoloft [Sertraline Hcl] Diarrhea   Current Outpatient Prescriptions on File Prior to Visit  Medication Sig Dispense Refill  . buPROPion (WELLBUTRIN XL) 150 MG 24 hr tablet Take 1 tablet (150 mg total) by mouth daily. 30 tablet 11  . dicyclomine (BENTYL) 10 MG capsule Take 1 tab twice daily. 90 capsule 0  . escitalopram (LEXAPRO) 10 MG tablet Take 1 tablet (10 mg total) by mouth daily. 30 tablet 11  . ferrous sulfate 325 (65 FE) MG tablet Take 1 tablet (325 mg total) by mouth 2 (two) times daily with a meal. 60 tablet 3  . fluticasone (FLONASE) 50 MCG/ACT nasal spray PLACE 2 SPRAYS INTO BOTH NOSTRILS DAILY. (Patient taking differently: PLACE 2 SPRAYS INTO BOTH NOSTRILS AS NEEDED) 16 g 1  . hydrocortisone (ANUSOL-HC) 25 MG suppository Take 1 suppository at bedtime x 14 days. 12 suppository 1  . mesalamine (LIALDA) 1.2 g EC tablet Take 2 tablets twice daily. 120 tablet 6  . Multiple Vitamin (MULTIVITAMIN) tablet Take 1 tablet by mouth daily.    . pantoprazole (PROTONIX) 40 MG tablet Take 1 tablet (40 mg total) by mouth daily. 30 tablet 11  . Probiotic Product (VSL#3 DS PO) Take by mouth.    . vitamin B-12 (CYANOCOBALAMIN) 1000 MCG tablet Take 1,000 mcg by mouth daily.    Marland Kitchen zolpidem (AMBIEN CR) 12.5 MG CR tablet TAKE 1 TABLET BY MOUTH AT BEDTIME AS NEEDED FOR SLEEP 30 tablet 3   No current facility-administered medications on file prior to visit.     Review of Systems Review of Systems  Constitutional: Negative for fever, appetite change, fatigue and unexpected weight change.  Eyes: Negative for pain and visual disturbance.  Respiratory: Negative for cough and shortness of breath.   Cardiovascular: Negative for cp or palpitations    Gastrointestinal: Negative for nausea, diarrhea and constipation.  Genitourinary: Negative for urgency and frequency.  Skin: Negative for  pallor or rash   Neurological: Negative for weakness, light-headedness, numbness and headaches.  Hematological: Negative for adenopathy. Does not bruise/bleed easily.  Psychiatric/Behavioral: Negative for dysphoric mood. The patient is not nervous/anxious.         Objective:   Physical Exam  Constitutional: She appears well-developed and well-nourished. No distress.  obese and well appearing   HENT:  Head: Normocephalic and atraumatic.  Mouth/Throat: Oropharynx is clear and moist.  Nares are boggy No sinus tenderness   Eyes: Conjunctivae and EOM are normal. Pupils are equal, round, and reactive to light.  Neck: Normal range of motion. Neck supple. No JVD present. Carotid bruit is not present. No thyromegaly present.  Cardiovascular: Normal rate, regular rhythm, normal heart sounds and intact distal pulses.  Exam reveals no gallop.  Pulmonary/Chest: Effort normal and breath sounds normal. No respiratory distress. She has no wheezes. She has no rales.  No crackles  Abdominal: Soft. Bowel sounds are normal. She exhibits no distension, no abdominal bruit and no mass. There is no tenderness.  Musculoskeletal: She exhibits no edema.  Lymphadenopathy:    She has no cervical adenopathy.  Neurological: She is alert. She has normal reflexes.  Skin: Skin is warm and dry. No rash noted.  Psychiatric: She has a normal mood and affect.  Vitals reviewed.         Assessment & Plan:   Problem List Items Addressed This Visit      Cardiovascular and Mediastinum   Essential hypertension - Primary    Will change from ace to ARB in light of cough - suspect ace cough  Losartan hct -will start today  bp has been well controlled        Relevant Medications   losartan-hydrochlorothiazide (HYZAAR) 50-12.5 MG tablet     Genitourinary   RESOLVED: Renal insufficiency     Other   Cough    Suspect due to ace  Will change to ARB Update if cough continues  pnd and allergies may also add to  it        Other Visit Diagnoses   None.

## 2016-05-02 DIAGNOSIS — R059 Cough, unspecified: Secondary | ICD-10-CM | POA: Insufficient documentation

## 2016-05-02 DIAGNOSIS — R05 Cough: Secondary | ICD-10-CM | POA: Insufficient documentation

## 2016-05-02 DIAGNOSIS — N289 Disorder of kidney and ureter, unspecified: Secondary | ICD-10-CM | POA: Insufficient documentation

## 2016-05-02 NOTE — Assessment & Plan Note (Signed)
Suspect due to ace  Will change to ARB Update if cough continues  pnd and allergies may also add to it

## 2016-05-02 NOTE — Assessment & Plan Note (Deleted)
With elevated Cr of unknown cause Few leuk on UA - sent for culture  Repeat bmp today off maxzide and with better water intake  Wt is stable/ bp is acceptable  Will also keep contact with family in light of memory loss

## 2016-05-02 NOTE — Assessment & Plan Note (Addendum)
Will change from ace to ARB in light of cough - suspect ace cough  Losartan hct -will start today  bp has been well controlled

## 2016-05-05 ENCOUNTER — Encounter: Payer: Self-pay | Admitting: Family Medicine

## 2016-05-05 ENCOUNTER — Ambulatory Visit (INDEPENDENT_AMBULATORY_CARE_PROVIDER_SITE_OTHER): Payer: Managed Care, Other (non HMO) | Admitting: Family Medicine

## 2016-05-05 VITALS — BP 128/78 | HR 69 | Temp 97.7°F | Wt 219.8 lb

## 2016-05-05 DIAGNOSIS — H66002 Acute suppurative otitis media without spontaneous rupture of ear drum, left ear: Secondary | ICD-10-CM

## 2016-05-05 DIAGNOSIS — J0100 Acute maxillary sinusitis, unspecified: Secondary | ICD-10-CM

## 2016-05-05 DIAGNOSIS — J3089 Other allergic rhinitis: Secondary | ICD-10-CM

## 2016-05-05 MED ORDER — AMOXICILLIN-POT CLAVULANATE 875-125 MG PO TABS: 1.0000 | ORAL_TABLET | Freq: Two times a day (BID) | ORAL | 0 refills | Status: DC

## 2016-05-05 MED ORDER — FLUTICASONE PROPIONATE 50 MCG/ACT NA SUSP: 2.0000 | Freq: Every day | NASAL | 6 refills | Status: DC

## 2016-05-05 NOTE — Progress Notes (Signed)
Subjective:    Patient ID: Carol Johnson, female    DOB: October 20, 1965, 50 y.o.   MRN: [id number]  HPI This is a 50 yo female who presents today with sinus pain and pressure for 1-2 weeks. No nasal drainage, head feels stopped up, bilateral ear pain. Takes an over the counter sinus medication and not sure of the name. She has also been taking something for pain, she is not sure of the name (maybe ibuprofen) 3-4 tablets 2-3 times a day with some relief. Is not typically bothered this time of year with allergies. Has been on flonase in the past, thinks she is out.   She was seen 5 days ago for cough and her BP medication was changed from [place] (per medical record, patient not sure) to Hyzaar. She is not having any fever, SOB or wheeze, she does not want to talk about her cough, she would like a strong antibiotic (unsure of the name) for her sinuses. Does not see why she should be asked about her smoking use. Doesn't smoke during the day, only at night, unsure of number of cigarettes.   Past Medical History:  Diagnosis Date  . Abdominal pain, unspecified site   . Allergic rhinitis   . Anxiety   . Asthma   . Depression   . DVT (deep venous thrombosis) ([place]) 2016  . Fibroid uterus   . GERD (gastroesophageal reflux disease)   . Hypercholesterolemia   . Internal hemorrhoid   . Irritable bowel syndrome   . Lymphocytic colitis 01/02/2015  . Pneumonia 1998  . URI (upper respiratory infection)   . Uterine fibroid   . Vitamin B12 deficiency    Past Surgical History:  Procedure Laterality Date  . CESAREAN SECTION  2004  . CHOLECYSTECTOMY     laparoscopic "gallstones"  . ESOPHAGOGASTRODUODENOSCOPY (EGD) WITH PROPOFOL N/A 05/03/2014   Procedure: ESOPHAGOGASTRODUODENOSCOPY (EGD) WITH PROPOFOL;  Surgeon: Inda Castle, MD;  Location: [facility];  Service: Endoscopy;  Laterality: N/A;  . MYOMECTOMY    . UTERINE FIBROID SURGERY     Family History  Problem Relation Age of Onset  . Colon  cancer Paternal Aunt   . Uterine cancer Paternal Grandmother     with mets to the colon   . Heart disease Maternal Grandmother   . Heart disease Paternal Uncle   . Heart disease Paternal Aunt   . Kidney disease Cousin    Social History  Substance Use Topics  . Smoking status: Light Tobacco Smoker    Types: Cigarettes  . Smokeless tobacco: Never Used  . Alcohol use 6.6 oz/week    10 Cans of beer, 1 Standard drinks or equivalent per week     Comment: occ      Review of Systems Per HPI    Objective:   Physical Exam  Constitutional: She is oriented to person, place, and time. She appears well-developed and well-nourished. No distress.  obese  HENT:  Head: Normocephalic and atraumatic.  Right Ear: Ear canal normal. There is tenderness. Tympanic membrane is injected. Tympanic membrane is not perforated.  Left Ear: Ear canal normal. There is tenderness. Tympanic membrane is injected and erythematous. Tympanic membrane is not perforated.  Nose: Mucosal edema and rhinorrhea present.  Mouth/Throat: Uvula is midline, oropharynx is clear and moist and mucous membranes are normal.  Cardiovascular: Normal rate, regular rhythm and normal heart sounds.   Pulmonary/Chest: Effort normal and breath sounds normal.  Neurological: She is alert and oriented to person, place,  and time.  Skin: Skin is warm and dry. She is not diaphoretic.  Psychiatric:  Flat affect.   Vitals reviewed.     BP 128/78 (BP Location: Left Arm, Patient Position: Sitting, Cuff Size: Normal)   Pulse 69   Temp 97.7 F (36.5 C) (Oral)   Wt 219 lb 12 oz (99.7 kg)   SpO2 98%   BMI 40.19 kg/m  Wt Readings from Last 3 Encounters:  05/05/16 219 lb 12 oz (99.7 kg)  04/30/16 222 lb 4 oz (100.8 kg)  04/26/16 216 lb 6 oz (98.1 kg)       Assessment & Plan:  1. Acute suppurative otitis media of left ear without spontaneous rupture of tympanic membrane, recurrence not specified - amoxicillin-clavulanate (AUGMENTIN)  875-125 MG tablet; Take 1 tablet by mouth 2 (two) times daily.  Dispense: 14 tablet; Refill: 0 - restart flonase, continue otc analgesic  2. Acute maxillary sinusitis, recurrence not specified - amoxicillin-clavulanate (AUGMENTIN) 875-125 MG tablet; Take 1 tablet by mouth 2 (two) times daily.  Dispense: 14 tablet; Refill: 0 - restart flonase, can use Afrin for 3 days for nasal cogestion  3. Other allergic rhinitis - fluticasone (FLONASE) 50 MCG/ACT nasal spray; Place 2 sprays into both nostrils daily.  Dispense: 16 g; Refill: 6  - RTC precautions reviewed  Clarene Reamer, FNP-BC  [facility]  05/05/2016 9:33 AM

## 2016-05-05 NOTE — Progress Notes (Signed)
Pre visit review using our clinic review tool, if applicable. No additional management support is needed unless otherwise documented below in the visit note. 

## 2016-05-05 NOTE — Patient Instructions (Signed)
For nasal congestion you can use Afrin nasal spray for 3 days max, Sudafed, saline nasal spray (generic is fine for all). For cough you can try Delsym. Drink enough fluids to make your urine light yellow. For fever/chill/muscle aches you can take over the counter acetaminophen or ibuprofen.  Please come back in if you are not better in 5-7 days or if you develop wheezing, shortness of breath or persistent vomiting.

## 2016-05-26 ENCOUNTER — Other Ambulatory Visit: Payer: Self-pay | Admitting: Physician Assistant

## 2016-05-31 ENCOUNTER — Ambulatory Visit: Payer: Self-pay | Admitting: Family Medicine

## 2016-06-22 ENCOUNTER — Other Ambulatory Visit: Payer: Self-pay | Admitting: Physician Assistant

## 2016-06-22 ENCOUNTER — Other Ambulatory Visit: Payer: Self-pay | Admitting: Family Medicine

## 2016-07-08 ENCOUNTER — Ambulatory Visit: Payer: Self-pay | Admitting: Gastroenterology

## 2016-07-22 ENCOUNTER — Other Ambulatory Visit: Payer: Self-pay | Admitting: Family Medicine

## 2016-07-26 NOTE — Telephone Encounter (Signed)
Please refill for a year  

## 2016-07-26 NOTE — Telephone Encounter (Signed)
Both are due for a refill, please advise

## 2016-07-26 NOTE — Telephone Encounter (Signed)
done

## 2016-07-31 ENCOUNTER — Other Ambulatory Visit: Payer: Self-pay | Admitting: Family Medicine

## 2016-10-26 ENCOUNTER — Other Ambulatory Visit: Payer: Self-pay | Admitting: *Deleted

## 2016-10-26 MED ORDER — ZOLPIDEM TARTRATE ER 12.5 MG PO TBCR: 12.5000 mg | EXTENDED_RELEASE_TABLET | Freq: Every evening | ORAL | 3 refills | Status: DC | PRN

## 2016-10-26 NOTE — Telephone Encounter (Signed)
Last OV was 04/30/16, last filled on 01/27/16 #30 with 3 additional refills, please advise

## 2016-10-26 NOTE — Telephone Encounter (Signed)
Px written for call in   

## 2016-10-27 NOTE — Telephone Encounter (Signed)
Rx called in as prescribed 

## 2017-02-01 ENCOUNTER — Telehealth: Payer: Self-pay | Admitting: Physician Assistant

## 2017-02-02 NOTE — Telephone Encounter (Signed)
Called and tried to leave a message for this patient but her mailbox is full.  I wanted to advise we no longer get Lialda samples due to the fact Lialda has gone generic.

## 2017-02-07 NOTE — Telephone Encounter (Signed)
The patient said she has some samples of Lialda with an expiration date of 07/2017.  She said the samples she has have a smell to them. She said the bottle of tablets she has from her last script don't have a smell to them. I told her to take them to a pharmacy and see if they could tell if they are ok to take.  I advised I don't know what they normally smell like.

## 2017-03-16 ENCOUNTER — Encounter (HOSPITAL_COMMUNITY): Payer: Self-pay | Admitting: Emergency Medicine

## 2017-03-16 ENCOUNTER — Emergency Department (HOSPITAL_COMMUNITY): Payer: No Typology Code available for payment source

## 2017-03-16 ENCOUNTER — Emergency Department (HOSPITAL_COMMUNITY)
Admission: EM | Admit: 2017-03-16 | Discharge: 2017-03-16 | Disposition: A | Discharge status: Home / Self Care | Payer: No Typology Code available for payment source | Attending: Emergency Medicine | Admitting: Emergency Medicine

## 2017-03-16 DIAGNOSIS — F17210 Nicotine dependence, cigarettes, uncomplicated: Secondary | ICD-10-CM | POA: Insufficient documentation

## 2017-03-16 DIAGNOSIS — T07XXXA Unspecified multiple injuries, initial encounter: Secondary | ICD-10-CM | POA: Diagnosis present

## 2017-03-16 DIAGNOSIS — Y999 Unspecified external cause status: Secondary | ICD-10-CM | POA: Insufficient documentation

## 2017-03-16 DIAGNOSIS — Z79899 Other long term (current) drug therapy: Secondary | ICD-10-CM | POA: Diagnosis not present

## 2017-03-16 DIAGNOSIS — J45909 Unspecified asthma, uncomplicated: Secondary | ICD-10-CM | POA: Diagnosis not present

## 2017-03-16 DIAGNOSIS — I10 Essential (primary) hypertension: Secondary | ICD-10-CM | POA: Diagnosis not present

## 2017-03-16 DIAGNOSIS — S39012A Strain of muscle, fascia and tendon of lower back, initial encounter: Secondary | ICD-10-CM | POA: Diagnosis not present

## 2017-03-16 DIAGNOSIS — Y9389 Activity, other specified: Secondary | ICD-10-CM | POA: Diagnosis not present

## 2017-03-16 DIAGNOSIS — S161XXA Strain of muscle, fascia and tendon at neck level, initial encounter: Secondary | ICD-10-CM | POA: Diagnosis not present

## 2017-03-16 DIAGNOSIS — Y92410 Unspecified street and highway as the place of occurrence of the external cause: Secondary | ICD-10-CM | POA: Insufficient documentation

## 2017-03-16 HISTORY — DX: Essential (primary) hypertension: I10

## 2017-03-16 HISTORY — DX: Ulcerative colitis, unspecified, without complications: K51.90

## 2017-03-16 MED ORDER — CYCLOBENZAPRINE HCL 10 MG PO TABS: 10.0000 mg | ORAL_TABLET | Freq: Two times a day (BID) | ORAL | 0 refills | Status: DC | PRN

## 2017-03-16 MED ORDER — NAPROXEN 500 MG PO TABS: 500.0000 mg | ORAL_TABLET | Freq: Two times a day (BID) | ORAL | 0 refills | Status: DC

## 2017-03-16 NOTE — Discharge Instructions (Signed)
Heating pads. Rest. Naprosyn for pain and inflammation. Flexeril for spasms. Follow up with family doctor as needed.
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
## 2017-03-17 ENCOUNTER — Telehealth: Payer: Self-pay | Admitting: Gastroenterology

## 2017-03-17 NOTE — Telephone Encounter (Signed)
Patient needs PAP forms filled out for Lialda........ Told patient to bring forms to the office and I would work on them for her.. She stated she has no insurance and no job

## 2017-03-18 ENCOUNTER — Telehealth: Payer: Self-pay | Admitting: *Deleted

## 2017-03-18 NOTE — Telephone Encounter (Signed)
Faxed signed forms to [facility] for patient today for PAP for Lialda, waiting on response

## 2017-04-16 ENCOUNTER — Other Ambulatory Visit: Payer: Self-pay | Admitting: Family Medicine

## 2017-04-18 NOTE — Telephone Encounter (Signed)
done

## 2017-04-18 NOTE — Telephone Encounter (Signed)
Please refill times 5

## 2017-05-02 ENCOUNTER — Other Ambulatory Visit: Payer: Self-pay | Admitting: Physician Assistant

## 2017-05-25 ENCOUNTER — Other Ambulatory Visit: Payer: Self-pay | Admitting: Family Medicine

## 2017-05-26 NOTE — Telephone Encounter (Signed)
Left voicemail with family requesting pt to call the office back

## 2017-05-26 NOTE — Telephone Encounter (Signed)
Please schedule f/u and refill until then  

## 2017-05-26 NOTE — Telephone Encounter (Signed)
Pt hasn't been seen in over a year and no future appts., please advise

## 2017-05-27 NOTE — Telephone Encounter (Signed)
Pt left /vm requesting to get losartan today. Pt request cb today.

## 2017-07-04 ENCOUNTER — Encounter: Payer: Self-pay | Admitting: Family Medicine

## 2017-07-04 ENCOUNTER — Ambulatory Visit: Payer: BLUE CROSS/BLUE SHIELD | Admitting: Family Medicine

## 2017-07-04 VITALS — BP 122/64 | HR 90 | Temp 98.0°F | Ht 62.0 in | Wt 220.5 lb

## 2017-07-04 DIAGNOSIS — F329 Major depressive disorder, single episode, unspecified: Secondary | ICD-10-CM | POA: Diagnosis not present

## 2017-07-04 DIAGNOSIS — Z23 Encounter for immunization: Secondary | ICD-10-CM | POA: Diagnosis not present

## 2017-07-04 DIAGNOSIS — E538 Deficiency of other specified B group vitamins: Secondary | ICD-10-CM | POA: Diagnosis not present

## 2017-07-04 DIAGNOSIS — I10 Essential (primary) hypertension: Secondary | ICD-10-CM | POA: Diagnosis not present

## 2017-07-04 DIAGNOSIS — E66813 Obesity, class 3: Secondary | ICD-10-CM

## 2017-07-04 DIAGNOSIS — R202 Paresthesia of skin: Secondary | ICD-10-CM

## 2017-07-04 DIAGNOSIS — R631 Polydipsia: Secondary | ICD-10-CM

## 2017-07-04 DIAGNOSIS — Z6841 Body Mass Index (BMI) 40.0 and over, adult: Secondary | ICD-10-CM

## 2017-07-04 MED ORDER — ZOLPIDEM TARTRATE ER 12.5 MG PO TBCR: 12.5000 mg | EXTENDED_RELEASE_TABLET | Freq: Every evening | ORAL | 3 refills | Status: DC | PRN

## 2017-07-04 MED ORDER — PANTOPRAZOLE SODIUM 40 MG PO TBEC: 40.0000 mg | DELAYED_RELEASE_TABLET | Freq: Every day | ORAL | 11 refills | Status: DC

## 2017-07-04 MED ORDER — LOSARTAN POTASSIUM-HCTZ 50-12.5 MG PO TABS: 1.0000 | ORAL_TABLET | Freq: Every day | ORAL | 11 refills | Status: DC

## 2017-07-04 MED ORDER — ESCITALOPRAM OXALATE 10 MG PO TABS: 10.0000 mg | ORAL_TABLET | Freq: Every day | ORAL | 11 refills | Status: DC

## 2017-07-04 MED ORDER — BUPROPION HCL ER (XL) 150 MG PO TB24: 150.0000 mg | ORAL_TABLET | Freq: Every day | ORAL | 11 refills | Status: DC

## 2017-07-04 NOTE — Assessment & Plan Note (Signed)
Doing fairly well with lexapro and wellbutrin Less stress with employment she likes Reviewed stressors/ coping techniques/symptoms/ support sources/ tx options and side effects in detail today Refilled medications  Stressed imp of exercise /self care and outdoor time

## 2017-07-04 NOTE — Assessment & Plan Note (Signed)
Level today  C/o tingling in feet

## 2017-07-04 NOTE — Assessment & Plan Note (Signed)
A1C today to r/o DM

## 2017-07-04 NOTE — Progress Notes (Signed)
Subjective:    Patient ID: Carol Johnson, female    DOB: [birth year], 51 y.o.   MRN: [id number]  HPI  Here for f/u of chronic health problems  Overall doing fairly well   Some tingling in her feet -about 2 months  A little numbness  She is thirsty as well -long time  Should be tested for diabetes   Drinks mostly tea- very lightly sweetened   Stressed  Lost 2 jobs in a row  New job now for 10 weeks !   Wt Readings from Last 3 Encounters:  07/04/17 220 lb 8 oz (100 kg)  03/16/17 215 lb (97.5 kg)  05/05/16 219 lb 12 oz (99.7 kg)  gaining weight  Not doing well with diet and exercise -no time for self care /better now  Started doing weight watchers at her new workplace and has lost over 7 lb so far  Not much exercise - hard to get motivated  40.33 kg/m  Is chronically tired   bp is stable today  No cp or palpitations or headaches or edema  No side effects to medicines  BP Readings from Last 3 Encounters:  07/04/17 122/64  03/16/17 (!) 127/93  05/05/16 128/78     On lexapro and wellbutrin for depression  Still helpful  Is out of lexapro (was out of ins for a while)   Patient Active Problem List   Diagnosis Date Noted  . Paresthesia of both feet 07/04/2017  . Always thirsty 07/04/2017  . Cough 05/02/2016  . Pedal edema 01/27/2016  . Abdominal bloating 12/01/2015  . Fatigue 12/01/2015  . Vitamin B12 deficiency 12/01/2015  . Lymphocytic colitis 01/16/2015  . Anemia, iron deficiency 01/16/2015  . Right leg pain 12/30/2014  . Anxiety state 12/20/2014  . Diarrhea 11/27/2014  . Insomnia 11/27/2014  . Sore throat 09/17/2014  . History of DVT (deep vein thrombosis) 09/17/2014  . Sleep disorder 09/02/2014  . Gastritis, chronic 06/06/2014  . Unspecified gastritis and gastroduodenitis without mention of hemorrhage 05/03/2014  . Abdominal pain, epigastric 04/19/2014  . Internal hemorrhoids with other complication 94/76/5465  . Obese 06/27/2013  . Routine general  medical examination at a health care facility 10/14/2011  . Thyroid nodule 05/16/2011  . Rectal bleeding 11/24/2010  . Hemorrhoids, internal 11/24/2010  . Irritable bowel syndrome (IBS) 11/24/2010  . ANEMIA 10/02/2009  . Hyperlipidemia 07/18/2009  . Essential hypertension 07/18/2009  . DYSPNEA ON EXERTION 07/18/2009  . THYROID CYST 01/31/2008  . ANXIETY 01/31/2008  . Depression 01/31/2008  . GASTRIC ULCER, HX OF 01/31/2008  . FIBROIDS, UTERUS 11/13/2007  . ALLERGIC RHINITIS 11/13/2007  . ASTHMA 11/13/2007  . GERD 11/13/2007  . IRRITABLE BOWEL SYNDROME 11/13/2007  . INTERNAL HEMORRHOIDS 11/12/2003   Past Medical History:  Diagnosis Date  . Abdominal pain, unspecified site   . Allergic rhinitis   . Anxiety   . Asthma   . Depression   . DVT (deep venous thrombosis) ([place]) 2016  . Fibroid uterus   . GERD (gastroesophageal reflux disease)   . Hypercholesterolemia   . Hypertension   . Internal hemorrhoid   . Irritable bowel syndrome   . Lymphocytic colitis 01/02/2015  . Pneumonia 1998  . Ulcerative colitis ([place])   . URI (upper respiratory infection)   . Uterine fibroid   . Vitamin B12 deficiency    Past Surgical History:  Procedure Laterality Date  . CESAREAN SECTION  2004  . CHOLECYSTECTOMY     laparoscopic "gallstones"  . MYOMECTOMY    .  UTERINE FIBROID SURGERY     Social History   Tobacco Use  . Smoking status: Light Tobacco Smoker    Types: Cigarettes  . Smokeless tobacco: Never Used  Substance Use Topics  . Alcohol use: Yes    Alcohol/week: 6.6 oz    Types: 10 Cans of beer, 1 Standard drinks or equivalent per week    Comment: occ  . Drug use: No   Family History  Problem Relation Age of Onset  . Colon cancer Paternal Aunt   . Uterine cancer Paternal Grandmother        with mets to the colon   . Heart disease Maternal Grandmother   . Heart disease Paternal Uncle   . Heart disease Paternal Aunt   . Kidney disease Cousin    Allergies  Allergen  Reactions  . Sertraline Hcl     REACTION: diarrhea  . Zoloft [Sertraline Hcl] Diarrhea   Current Outpatient Medications on File Prior to Visit  Medication Sig Dispense Refill  . fluticasone (FLONASE) 50 MCG/ACT nasal spray Place 2 sprays into both nostrils daily. (Patient taking differently: Place 2 sprays daily as needed into both nostrils. ) 16 g 6  . mesalamine (LIALDA) 1.2 g EC tablet TAKE 2 TABLETS BY MOUTH TWICE DAILY 60 tablet 7  . ferrous sulfate 325 (65 FE) MG tablet Take 1 tablet (325 mg total) by mouth 2 (two) times daily with a meal. 60 tablet 3   No current facility-administered medications on file prior to visit.     Review of Systems  Constitutional: Positive for fatigue. Negative for activity change, appetite change, fever and unexpected weight change.  HENT: Negative for congestion, ear pain, rhinorrhea, sinus pressure and sore throat.   Eyes: Negative for pain, redness and visual disturbance.  Respiratory: Negative for cough, shortness of breath and wheezing.   Cardiovascular: Negative for chest pain and palpitations.  Gastrointestinal: Negative for abdominal pain, blood in stool, constipation and diarrhea.  Endocrine: Positive for polydipsia. Negative for polyuria.  Genitourinary: Negative for dysuria, frequency and urgency.  Musculoskeletal: Negative for arthralgias, back pain and myalgias.  Skin: Negative for pallor and rash.  Allergic/Immunologic: Negative for environmental allergies.  Neurological: Positive for numbness. Negative for dizziness, syncope and headaches.  Hematological: Negative for adenopathy. Does not bruise/bleed easily.  Psychiatric/Behavioral: Negative for decreased concentration and dysphoric mood. The patient is not nervous/anxious.        Objective:   Physical Exam  Constitutional: She appears well-developed and well-nourished. No distress.  obese and well appearing   HENT:  Head: Normocephalic and atraumatic.  Mouth/Throat: Oropharynx  is clear and moist.  Eyes: Conjunctivae and EOM are normal. Pupils are equal, round, and reactive to light.  Neck: Normal range of motion. Neck supple. No JVD present. Carotid bruit is not present. No thyromegaly present.  Cardiovascular: Normal rate, regular rhythm, normal heart sounds and intact distal pulses. Exam reveals no gallop.  Pulmonary/Chest: Effort normal and breath sounds normal. No respiratory distress. She has no wheezes. She has no rales.  No crackles  Abdominal: Soft. Bowel sounds are normal. She exhibits no distension, no abdominal bruit and no mass. There is no tenderness.  Musculoskeletal: She exhibits no edema.  Lymphadenopathy:    She has no cervical adenopathy.  Neurological: She is alert. She has normal reflexes. No cranial nerve deficit. She exhibits normal muscle tone. Coordination normal.  Nl sens to lt touch and temp in feet and fingers   Skin: Skin is warm and dry.  No rash noted. No pallor.  Psychiatric: She has a normal mood and affect.          Assessment & Plan:   Problem List Items Addressed This Visit      Cardiovascular and Mediastinum   Essential hypertension - Primary    bp in fair control at this time  BP Readings from Last 1 Encounters:  07/04/17 122/64   No changes needed Disc lifstyle change with low sodium diet and exercise  Labs today      Relevant Medications   losartan-hydrochlorothiazide (HYZAAR) 50-12.5 MG tablet   Other Relevant Orders   CBC with Differential/Platelet   Comprehensive metabolic panel   Lipid panel   TSH     Other   Always thirsty    A1C today to r/o DM      Relevant Orders   Hemoglobin A1c   Depression    Doing fairly well with lexapro and wellbutrin Less stress with employment she likes Reviewed stressors/ coping techniques/symptoms/ support sources/ tx options and side effects in detail today Refilled medications  Stressed imp of exercise /self care and outdoor time      Relevant Medications    escitalopram (LEXAPRO) 10 MG tablet   buPROPion (WELLBUTRIN XL) 150 MG 24 hr tablet   Obese    Discussed how this problem influences overall health and the risks it imposes  Reviewed plan for weight loss with lower calorie diet (via better food choices and also portion control or program like weight watchers) and exercise building up to or more than 30 minutes 5 days per week including some aerobic activity   Enc to continue wt watchers  Also start exercise program      Paresthesia of both feet    Nl exam Check A1C and B12 level today  ? If rel to auto immune disorder       Relevant Orders   Hemoglobin A1c   Vitamin B12   Vitamin B12 deficiency    Level today  C/o tingling in feet       Other Visit Diagnoses    Need for 23-polyvalent pneumococcal polysaccharide vaccine       Relevant Orders   Pneumococcal polysaccharide vaccine 23-valent greater than or equal to 2yo subcutaneous/IM (Completed)

## 2017-07-04 NOTE — Assessment & Plan Note (Signed)
bp in fair control at this time  BP Readings from Last 1 Encounters:  07/04/17 122/64   No changes needed Disc lifstyle change with low sodium diet and exercise  Labs today

## 2017-07-04 NOTE — Patient Instructions (Addendum)
Keep up with weight watchers  Make a goal of 30 minutes or more of exercise 5 days per week   Pneumonia vaccine today   Take care of yourself   Lab today for chronic issues and tingling

## 2017-07-04 NOTE — Assessment & Plan Note (Signed)
Nl exam Check A1C and B12 level today  ? If rel to auto immune disorder

## 2017-07-04 NOTE — Assessment & Plan Note (Signed)
Discussed how this problem influences overall health and the risks it imposes  Reviewed plan for weight loss with lower calorie diet (via better food choices and also portion control or program like weight watchers) and exercise building up to or more than 30 minutes 5 days per week including some aerobic activity   Enc to continue wt watchers  Also start exercise program

## 2017-07-05 LAB — CBC WITH DIFFERENTIAL/PLATELET
BASOS PCT: 1.4 % (ref 0.0–3.0)
Basophils Absolute: 0.1 10*3/uL (ref 0.0–0.1)
EOS ABS: 0.5 10*3/uL (ref 0.0–0.7)
Eosinophils Relative: 4.9 % (ref 0.0–5.0)
HCT: 40.6 % (ref 36.0–46.0)
Hemoglobin: 13.2 g/dL (ref 12.0–15.0)
Lymphocytes Relative: 28.3 % (ref 12.0–46.0)
Lymphs Abs: 2.7 10*3/uL (ref 0.7–4.0)
MCHC: 32.5 g/dL (ref 30.0–36.0)
MCV: 86.4 fl (ref 78.0–100.0)
MONO ABS: 0.8 10*3/uL (ref 0.1–1.0)
Monocytes Relative: 8.3 % (ref 3.0–12.0)
NEUTROS ABS: 5.4 10*3/uL (ref 1.4–7.7)
Neutrophils Relative %: 57.1 % (ref 43.0–77.0)
PLATELETS: 392 10*3/uL (ref 150.0–400.0)
RBC: 4.7 Mil/uL (ref 3.87–5.11)
RDW: 15.3 % (ref 11.5–15.5)
WBC: 9.4 10*3/uL (ref 4.0–10.5)

## 2017-07-05 LAB — COMPREHENSIVE METABOLIC PANEL
ALT: 16 U/L (ref 0–35)
AST: 15 U/L (ref 0–37)
Albumin: 3.9 g/dL (ref 3.5–5.2)
Alkaline Phosphatase: 70 U/L (ref 39–117)
BUN: 9 mg/dL (ref 6–23)
CALCIUM: 9.6 mg/dL (ref 8.4–10.5)
CHLORIDE: 101 meq/L (ref 96–112)
CO2: 27 meq/L (ref 19–32)
CREATININE: 0.72 mg/dL (ref 0.40–1.20)
GFR: 90.15 mL/min (ref >60.00)
Glucose, Bld: 92 mg/dL (ref 70–99)
POTASSIUM: 3.8 meq/L (ref 3.5–5.1)
SODIUM: 135 meq/L (ref 135–145)
Total Bilirubin: 0.3 mg/dL (ref 0.2–1.2)
Total Protein: 6.9 g/dL (ref 6.0–8.3)

## 2017-07-05 LAB — LDL CHOLESTEROL, DIRECT: Direct LDL: 176 mg/dL

## 2017-07-05 LAB — LIPID PANEL
CHOL/HDL RATIO: 7
Cholesterol: 240 mg/dL — ABNORMAL HIGH (ref 0–200)
HDL: 33.9 mg/dL — ABNORMAL LOW (ref >39.00)
NONHDL: 206.02
TRIGLYCERIDES: 244 mg/dL — AB (ref 0.0–149.0)
VLDL: 48.8 mg/dL — AB (ref 0.0–40.0)

## 2017-07-05 LAB — TSH: TSH: 2.12 u[IU]/mL (ref 0.35–4.50)

## 2017-07-05 LAB — HEMOGLOBIN A1C: Hgb A1c MFr Bld: 5.9 % (ref 4.6–6.5)

## 2017-07-05 LAB — VITAMIN B12: Vitamin B-12: 259 pg/mL (ref 211–911)

## 2017-07-06 ENCOUNTER — Telehealth: Payer: Self-pay | Admitting: *Deleted

## 2017-07-06 MED ORDER — ATORVASTATIN CALCIUM 10 MG PO TABS: 10.0000 mg | ORAL_TABLET | Freq: Every day | ORAL | 11 refills | Status: DC

## 2017-07-06 NOTE — Addendum Note (Signed)
Addended by: Tammi Sou on: 07/06/2017 04:57 PM   Modules accepted: Orders

## 2017-07-06 NOTE — Telephone Encounter (Signed)
Pt notified of lab results and Dr. Marliss Coots comments. Pt was willing to try medication so Rx sent to pharmacy.  b12 inj scheduled and f/u lab appt scheduled

## 2017-07-06 NOTE — Telephone Encounter (Signed)
-----   Message from Abner Greenspan, MD sent at 07/05/2017 11:38 PM EST ----- Cholesterol is quite high Avoid red meat/ fried foods/ egg yolks/ fatty breakfast meats/ butter, cheese and high fat dairy/ and shellfish   If open to medication please call in atorvastatin 10 mg 1 po qd #30 11 ref and re check fasting lipid in 4-6 wk B12 is low'/normal Please have her come in for a B12 shot  Get B12 otc 1000 mcg 1 po qd Check B12 level with lipid in 4-6 wk please  Glucose control is good

## 2017-07-07 ENCOUNTER — Ambulatory Visit (INDEPENDENT_AMBULATORY_CARE_PROVIDER_SITE_OTHER): Payer: BLUE CROSS/BLUE SHIELD | Admitting: *Deleted

## 2017-07-07 DIAGNOSIS — E538 Deficiency of other specified B group vitamins: Secondary | ICD-10-CM | POA: Diagnosis not present

## 2017-07-07 MED ORDER — CYANOCOBALAMIN 1000 MCG/ML IJ SOLN
1000.0000 ug | Freq: Once | INTRAMUSCULAR | Status: AC
  Administered 2017-07-07: 1000 ug via INTRAMUSCULAR

## 2017-07-28 ENCOUNTER — Telehealth: Payer: Self-pay | Admitting: Family Medicine

## 2017-07-28 DIAGNOSIS — E538 Deficiency of other specified B group vitamins: Secondary | ICD-10-CM

## 2017-07-28 DIAGNOSIS — E7800 Pure hypercholesterolemia, unspecified: Secondary | ICD-10-CM

## 2017-07-28 NOTE — Telephone Encounter (Signed)
-----   Message from Ellamae Sia sent at 07/27/2017  3:48 PM EST ----- Regarding: Lab orders for Thursday, 12.6.18 Lab orders, please

## 2017-08-04 ENCOUNTER — Other Ambulatory Visit (INDEPENDENT_AMBULATORY_CARE_PROVIDER_SITE_OTHER): Payer: BLUE CROSS/BLUE SHIELD

## 2017-08-04 ENCOUNTER — Other Ambulatory Visit: Payer: Self-pay

## 2017-08-04 DIAGNOSIS — E538 Deficiency of other specified B group vitamins: Secondary | ICD-10-CM | POA: Diagnosis not present

## 2017-08-04 DIAGNOSIS — E7800 Pure hypercholesterolemia, unspecified: Secondary | ICD-10-CM

## 2017-08-04 LAB — LDL CHOLESTEROL, DIRECT: Direct LDL: 193 mg/dL

## 2017-08-04 LAB — LIPID PANEL
CHOL/HDL RATIO: 7
Cholesterol: 240 mg/dL — ABNORMAL HIGH (ref 0–200)
HDL: 35.6 mg/dL — AB (ref >39.00)
NONHDL: 204.12
TRIGLYCERIDES: 218 mg/dL — AB (ref 0.0–149.0)
VLDL: 43.6 mg/dL — ABNORMAL HIGH (ref 0.0–40.0)

## 2017-08-04 LAB — VITAMIN B12: Vitamin B-12: 428 pg/mL (ref 211–911)

## 2017-09-01 ENCOUNTER — Other Ambulatory Visit: Payer: Self-pay | Admitting: *Deleted

## 2017-09-01 MED ORDER — PANTOPRAZOLE SODIUM 40 MG PO TBEC: 40.0000 mg | DELAYED_RELEASE_TABLET | Freq: Every day | ORAL | 2 refills | Status: DC

## 2017-09-01 NOTE — Telephone Encounter (Signed)
Received fax saying for insurance purposes Rx needs to be a 90 day supply, Rx sent

## 2017-12-16 ENCOUNTER — Ambulatory Visit: Payer: Self-pay | Admitting: *Deleted

## 2017-12-16 NOTE — Telephone Encounter (Signed)
Returned pts call; Pt states she is presently at [facility],; cough, congestion, urinary stress incontinence and  "arms numb."

## 2017-12-16 NOTE — Telephone Encounter (Signed)
Attempted to call call pt but no answer at this time. Unable to leave message due to voicemail being full.

## 2017-12-21 ENCOUNTER — Encounter: Payer: Self-pay | Admitting: Family Medicine

## 2017-12-21 ENCOUNTER — Ambulatory Visit: Payer: BLUE CROSS/BLUE SHIELD | Admitting: Family Medicine

## 2017-12-21 VITALS — BP 124/78 | HR 74 | Temp 98.1°F | Ht 62.0 in | Wt 236.5 lb

## 2017-12-21 DIAGNOSIS — N393 Stress incontinence (female) (male): Secondary | ICD-10-CM | POA: Diagnosis not present

## 2017-12-21 DIAGNOSIS — Z6841 Body Mass Index (BMI) 40.0 and over, adult: Secondary | ICD-10-CM

## 2017-12-21 DIAGNOSIS — R0602 Shortness of breath: Secondary | ICD-10-CM | POA: Diagnosis not present

## 2017-12-21 DIAGNOSIS — G5600 Carpal tunnel syndrome, unspecified upper limb: Secondary | ICD-10-CM | POA: Insufficient documentation

## 2017-12-21 DIAGNOSIS — R0609 Other forms of dyspnea: Secondary | ICD-10-CM | POA: Insufficient documentation

## 2017-12-21 DIAGNOSIS — G5603 Carpal tunnel syndrome, bilateral upper limbs: Secondary | ICD-10-CM | POA: Diagnosis not present

## 2017-12-21 DIAGNOSIS — I10 Essential (primary) hypertension: Secondary | ICD-10-CM | POA: Diagnosis not present

## 2017-12-21 DIAGNOSIS — F329 Major depressive disorder, single episode, unspecified: Secondary | ICD-10-CM | POA: Diagnosis not present

## 2017-12-21 LAB — POC URINALSYSI DIPSTICK (AUTOMATED)
Bilirubin, UA: NEGATIVE
Glucose, UA: NEGATIVE
Ketones, UA: NEGATIVE
LEUKOCYTES UA: NEGATIVE
NITRITE UA: NEGATIVE
PROTEIN UA: NEGATIVE
RBC UA: NEGATIVE
Spec Grav, UA: 1.015 (ref 1.010–1.025)
UROBILINOGEN UA: 0.2 U/dL
pH, UA: 6 (ref 5.0–8.0)

## 2017-12-21 MED ORDER — BUPROPION HCL ER (XL) 300 MG PO TB24: 300.0000 mg | ORAL_TABLET | Freq: Every day | ORAL | 11 refills | Status: DC

## 2017-12-21 NOTE — Assessment & Plan Note (Addendum)
Worse lately with emotional eating and weight gain    Will try inc wellbutrin to 300 mg xl daily  Discussed expectations of this medication including time to effectiveness and mechanism of action, also poss of side effects (early and late)- including mental fuzziness, weight or appetite change, nausea and poss of worse dep or anxiety (even suicidal thoughts)  Pt voiced understanding and will stop med and update if this occurs   Reviewed stressors/ coping techniques/symptoms/ support sources/ tx options and side effects in detail today Significant stressors  Offered counseling

## 2017-12-21 NOTE — Progress Notes (Signed)
Subjective:    Patient ID: Carol Johnson, female    DOB: [birth year], 52 y.o.   MRN: [id number]  HPI Here for recent uri with some stress incontinence   Went to [facility] on 4/19 Diagnosed with a sinus infection  bp was elevated at 149/84 Given duoneb tx for wheeze/cough Px prednisone taper and augmentin   Some improvement  Finally coming out of it  The whole time she was sick-no bladder control -leaked with every cough and sneeze  Had to use pads  It has been noticeable 6 mo   Also having occ incontinence with urgency -not as often  Unsure how long it has gone on   Has had uterine fibroid surgery in the past (long ago)  Has not had a hysterectomy  ? Last gyn visit  No prolapse that she knows of   UA: Results for orders placed or performed in visit on 12/21/17  POCT Urinalysis Dipstick (Automated)  Result Value Ref Range   Color, UA straw    Clarity, UA clear    Glucose, UA neg    Bilirubin, UA neg    Ketones, UA neg    Spec Grav, UA 1.015 1.010 - 1.025   Blood, UA neg    pH, UA 6.0 5.0 - 8.0   Protein, UA neg    Urobilinogen, UA 0.2 0.2 or 1.0 E.U./dL   Nitrite, UA neg    Leukocytes, UA Negative Negative      Wt Readings from Last 3 Encounters:  12/21/17 236 lb 8 oz (107.3 kg)  07/04/17 220 lb 8 oz (100 kg)  03/16/17 215 lb (97.5 kg)  weight is up significantly   43.26 kg/m   bp is stable today  No cp or palpitations or headaches or edema  No side effects to medicines  BP Readings from Last 3 Encounters:  12/21/17 124/78  07/04/17 122/64  03/16/17 (!) 127/93      Still a light tobacco smoker   Having some numbness in arms and hands  Wakes up with it  Fingers get numb if she drives  Perhaps worse in index and thumb   Is more sob lately  Sob after 2-3 minutes for about 6 months  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 SOB (shortness of breath) on exertion 12/21/2017  . Carpal tunnel syndrome 12/21/2017  . Stress incontinence 12/21/2017  . Paresthesia of both feet 07/04/2017  . Always thirsty 07/04/2017  . Cough 05/02/2016  . Abdominal bloating 12/01/2015  . Fatigue 12/01/2015  . Vitamin B12 deficiency 12/01/2015  . Lymphocytic colitis 01/16/2015  . Anemia, iron deficiency 01/16/2015  . Anxiety state 12/20/2014  . Diarrhea 11/27/2014  . Insomnia 11/27/2014  . History of DVT (deep vein thrombosis) 09/17/2014  . Sleep disorder 09/02/2014  . Gastritis, chronic 06/06/2014  . Unspecified gastritis and gastroduodenitis without mention of hemorrhage 05/03/2014  . Abdominal pain, epigastric 04/19/2014  . Internal hemorrhoids with other complication 62/70/3500  . Obese 06/27/2013  . Routine general medical examination  at a health care facility 10/14/2011  . Thyroid nodule 05/16/2011  . Rectal bleeding 11/24/2010  . Hemorrhoids, internal 11/24/2010  . Irritable bowel syndrome (IBS) 11/24/2010  . ANEMIA 10/02/2009  . Hyperlipidemia 07/18/2009  . Essential hypertension 07/18/2009  . DYSPNEA ON EXERTION 07/18/2009  . THYROID CYST 01/31/2008  . ANXIETY 01/31/2008  . Depression 01/31/2008  . GASTRIC ULCER, HX OF 01/31/2008  . FIBROIDS, UTERUS 11/13/2007  . ALLERGIC RHINITIS 11/13/2007  . ASTHMA 11/13/2007  . GERD 11/13/2007  . IRRITABLE BOWEL SYNDROME 11/13/2007  . INTERNAL HEMORRHOIDS 11/12/2003   Past Medical History:  Diagnosis Date  . Abdominal pain, unspecified site   . Allergic rhinitis   . Anxiety   . Asthma   . Depression   . DVT (deep venous thrombosis) ([place]) 2016  . Fibroid uterus   . GERD (gastroesophageal reflux disease)   . Hypercholesterolemia   .  Hypertension   . Internal hemorrhoid   . Irritable bowel syndrome   . Lymphocytic colitis 01/02/2015  . Pneumonia 1998  . Ulcerative colitis ([place])   . URI (upper respiratory infection)   . Uterine fibroid   . Vitamin B12 deficiency    Past Surgical History:  Procedure Laterality Date  . CESAREAN SECTION  2004  . CHOLECYSTECTOMY     laparoscopic "gallstones"  . ESOPHAGOGASTRODUODENOSCOPY (EGD) WITH PROPOFOL N/A 05/03/2014   Procedure: ESOPHAGOGASTRODUODENOSCOPY (EGD) WITH PROPOFOL;  Surgeon: Inda Castle, MD;  Location: [facility];  Service: Endoscopy;  Laterality: N/A;  . MYOMECTOMY    . UTERINE FIBROID SURGERY     Social History   Tobacco Use  . Smoking status: Light Tobacco Smoker    Types: Cigarettes  . Smokeless tobacco: Never Used  Substance Use Topics  . Alcohol use: Yes    Alcohol/week: 6.6 oz    Types: 10 Cans of beer, 1 Standard drinks or equivalent per week    Comment: occ  . Drug use: No   Family History  Problem Relation Age of Onset  . Colon cancer Paternal Aunt   . Uterine cancer Paternal Grandmother        with mets to the colon   . Heart disease Maternal Grandmother   . Heart disease Paternal Uncle   . Heart disease Paternal Aunt   . Kidney disease Cousin    Allergies  Allergen Reactions  . Sertraline Hcl     REACTION: diarrhea  . Zoloft [Sertraline Hcl] Diarrhea   Current Outpatient Medications on File Prior to Visit  Medication Sig Dispense Refill  . amoxicillin-clavulanate (AUGMENTIN) 875-125 MG tablet Take 1 tablet by mouth daily.    Marland Kitchen atorvastatin (LIPITOR) 10 MG tablet Take 1 tablet (10 mg total) daily by mouth. 30 tablet 11  . escitalopram (LEXAPRO) 10 MG tablet Take 1 tablet (10 mg total) daily by mouth. 30 tablet 11  . ferrous sulfate 325 (65 FE) MG tablet Take 1 tablet (325 mg total) by mouth 2 (two) times daily with a meal. 60 tablet 3  . fexofenadine (ALLEGRA) 180 MG tablet Take 180 mg by mouth daily.    . fluticasone (FLONASE) 50  MCG/ACT nasal spray Place 2 sprays into both nostrils daily. (Patient taking differently: Place 2 sprays daily as needed into both nostrils. ) 16 g 6  . losartan-hydrochlorothiazide (HYZAAR) 50-12.5 MG tablet Take 1 tablet daily by mouth. 30 tablet 11  . mesalamine (LIALDA) 1.2 g EC tablet TAKE 2 TABLETS BY MOUTH TWICE DAILY 60 tablet 7  . pantoprazole (PROTONIX) 40  MG tablet Take 1 tablet (40 mg total) by mouth daily. 90 tablet 2  . predniSONE (DELTASONE) 10 MG tablet Take 4 tablets by mouth daily.  0  . zolpidem (AMBIEN CR) 12.5 MG CR tablet Take 1 tablet (12.5 mg total) at bedtime as needed by mouth. for sleep 30 tablet 3   No current facility-administered medications on file prior to visit.     Review of Systems     Objective:   Physical Exam        Assessment & Plan:

## 2017-12-21 NOTE — Patient Instructions (Addendum)
EKG looks good   Labs today for shortness of breath   I will put in an order for echocardiogram -it will tell us a lot about how your heart is functions  We will call you about that   Urine is clear  Once we get your shortness of breath figured out we can refer you to urology for the stress incontinence   Increase wellbutrin to 300 mg xl once daily for depression   We will call you to schedule a chest xray next week   Get out your carpal tunnel wrist splints and wear them at night

## 2017-12-22 LAB — CBC WITH DIFFERENTIAL/PLATELET
BASOS ABS: 0.2 10*3/uL — AB (ref 0.0–0.1)
BASOS PCT: 1.7 % (ref 0.0–3.0)
Eosinophils Absolute: 0.1 10*3/uL (ref 0.0–0.7)
Eosinophils Relative: 0.7 % (ref 0.0–5.0)
HEMATOCRIT: 36.5 % (ref 36.0–46.0)
Hemoglobin: 11.9 g/dL — ABNORMAL LOW (ref 12.0–15.0)
LYMPHS PCT: 24.4 % (ref 12.0–46.0)
Lymphs Abs: 3.4 10*3/uL (ref 0.7–4.0)
MCHC: 32.6 g/dL (ref 30.0–36.0)
MCV: 80 fl (ref 78.0–100.0)
MONOS PCT: 6.7 % (ref 3.0–12.0)
Monocytes Absolute: 0.9 10*3/uL (ref 0.1–1.0)
NEUTROS ABS: 9.4 10*3/uL — AB (ref 1.4–7.7)
Neutrophils Relative %: 66.5 % (ref 43.0–77.0)
PLATELETS: 406 10*3/uL — AB (ref 150.0–400.0)
RBC: 4.56 Mil/uL (ref 3.87–5.11)
RDW: 15.2 % (ref 11.5–15.5)
WBC: 14.1 10*3/uL — ABNORMAL HIGH (ref 4.0–10.5)

## 2017-12-22 LAB — COMPREHENSIVE METABOLIC PANEL
ALT: 14 U/L (ref 0–35)
AST: 10 U/L (ref 0–37)
Albumin: 3.8 g/dL (ref 3.5–5.2)
Alkaline Phosphatase: 87 U/L (ref 39–117)
BUN: 13 mg/dL (ref 6–23)
CALCIUM: 9.1 mg/dL (ref 8.4–10.5)
CO2: 31 mEq/L (ref 19–32)
Chloride: 101 mEq/L (ref 96–112)
Creatinine, Ser: 0.69 mg/dL (ref 0.40–1.20)
GFR: 94.52 mL/min (ref >60.00)
GLUCOSE: 77 mg/dL (ref 70–99)
POTASSIUM: 3.9 meq/L (ref 3.5–5.1)
Sodium: 137 mEq/L (ref 135–145)
Total Bilirubin: 0.2 mg/dL (ref 0.2–1.2)
Total Protein: 6.8 g/dL (ref 6.0–8.3)

## 2017-12-22 LAB — BRAIN NATRIURETIC PEPTIDE: Pro B Natriuretic peptide (BNP): 17 pg/mL (ref 0.0–100.0)

## 2017-12-22 NOTE — Assessment & Plan Note (Signed)
Worse lately- I suspect due to wt gain/obesity Pt is very worried about cardiac status  Reassuring EKG Lab today including BNP test  Will work on ref for echo (now it is after hours)  Alert if worse or any PND or orthopnea  Nl exam

## 2017-12-22 NOTE — Assessment & Plan Note (Signed)
Worse with wt gain Disc kegel exercises and the freeze and squeeze tech She is due for f/u with her gyn  Suggested ref to urology  Will wait until we have w/u sob on exertion first and also get depression under control

## 2017-12-22 NOTE — Assessment & Plan Note (Signed)
bp in fair control at this time  BP Readings from Last 1 Encounters:  12/21/17 124/78   No changes needed Disc lifstyle change with low sodium diet and exercise

## 2017-12-22 NOTE — Assessment & Plan Note (Signed)
Bilateral pos tinel/phalen tests Recommend wearing her wrist splints at night while sleeping Update with response

## 2017-12-22 NOTE — Assessment & Plan Note (Signed)
Discussed how this problem influences overall health and the risks it imposes  Reviewed plan for weight loss with lower calorie diet (via better food choices and also portion control or program like weight watchers) and exercise building up to or more than 30 minutes 5 days per week including some aerobic activity   Need to get depression under control due to emotional eating causing wt gain

## 2017-12-27 ENCOUNTER — Ambulatory Visit (INDEPENDENT_AMBULATORY_CARE_PROVIDER_SITE_OTHER)
Admission: RE | Admit: 2017-12-27 | Discharge: 2017-12-27 | Disposition: A | Discharge status: Home / Self Care | Payer: BLUE CROSS/BLUE SHIELD | Source: Ambulatory Visit | Attending: Family Medicine | Admitting: Family Medicine

## 2017-12-27 DIAGNOSIS — R0602 Shortness of breath: Secondary | ICD-10-CM | POA: Diagnosis not present

## 2017-12-28 ENCOUNTER — Other Ambulatory Visit: Payer: Self-pay | Admitting: Physician Assistant

## 2018-01-04 ENCOUNTER — Encounter: Payer: Self-pay | Admitting: Family Medicine

## 2018-01-05 ENCOUNTER — Other Ambulatory Visit: Payer: Self-pay

## 2018-01-05 ENCOUNTER — Ambulatory Visit (HOSPITAL_COMMUNITY): Payer: BLUE CROSS/BLUE SHIELD | Attending: Internal Medicine

## 2018-01-05 DIAGNOSIS — I10 Essential (primary) hypertension: Secondary | ICD-10-CM | POA: Insufficient documentation

## 2018-01-05 DIAGNOSIS — Z8249 Family history of ischemic heart disease and other diseases of the circulatory system: Secondary | ICD-10-CM | POA: Insufficient documentation

## 2018-01-05 DIAGNOSIS — R0602 Shortness of breath: Secondary | ICD-10-CM | POA: Diagnosis not present

## 2018-01-05 DIAGNOSIS — E785 Hyperlipidemia, unspecified: Secondary | ICD-10-CM | POA: Diagnosis not present

## 2018-01-05 MED ORDER — HYDROCHLOROTHIAZIDE 12.5 MG PO CAPS: 12.5000 mg | ORAL_CAPSULE | Freq: Every day | ORAL | 11 refills | Status: DC

## 2018-01-05 MED ORDER — LOSARTAN POTASSIUM 50 MG PO TABS: 50.0000 mg | ORAL_TABLET | Freq: Every day | ORAL | 11 refills | Status: DC

## 2018-01-05 NOTE — Telephone Encounter (Signed)
Her pharmacy could not fill hyzaar due to back order  I pended px for losartan and hctz separately  Please call them to see if they have these before sending and let me know if they do not

## 2018-01-05 NOTE — Telephone Encounter (Signed)
Per pharmacy they do have those doses separately, Rxs sent

## 2018-01-06 ENCOUNTER — Encounter: Payer: Self-pay | Admitting: Family Medicine

## 2018-01-11 ENCOUNTER — Encounter: Payer: Self-pay | Admitting: Family Medicine

## 2018-01-11 ENCOUNTER — Ambulatory Visit: Payer: BLUE CROSS/BLUE SHIELD | Admitting: Family Medicine

## 2018-01-11 VITALS — BP 128/64 | HR 98 | Temp 98.2°F | Ht 62.0 in | Wt 241.2 lb

## 2018-01-11 DIAGNOSIS — R05 Cough: Secondary | ICD-10-CM | POA: Diagnosis not present

## 2018-01-11 DIAGNOSIS — R062 Wheezing: Secondary | ICD-10-CM

## 2018-01-11 DIAGNOSIS — F17200 Nicotine dependence, unspecified, uncomplicated: Secondary | ICD-10-CM | POA: Diagnosis not present

## 2018-01-11 DIAGNOSIS — R0602 Shortness of breath: Secondary | ICD-10-CM

## 2018-01-11 DIAGNOSIS — R059 Cough, unspecified: Secondary | ICD-10-CM

## 2018-01-11 DIAGNOSIS — F418 Other specified anxiety disorders: Secondary | ICD-10-CM

## 2018-01-11 NOTE — Progress Notes (Signed)
Subjective:    Patient ID: Carol Johnson, female    DOB: [birth year], 52 y.o.   MRN: [id number]  HPI Here for f/u of sob on exertion and other health concerns   Still sob on exertion - even walking from car to door  Spirometry today shows mild restriction pattern   2D echocardiogram is re assuring -nl with EF 65-70%  CXR   DG Chest 2 View (Accession [id number]) (Order [id number])  Imaging  Date: 12/27/2017 Department: [facility] Released By: Lendon Collar, RT Authorizing: Vivion Romano, Wynelle Fanny, MD  Exam Information   Status Exam Begun  Exam Ended   Final [99] 12/27/2017 11:32 AM 12/27/2017 11:34 AM  PACS Images   Show images for DG Chest 2 View  Study Result   CLINICAL DATA:  Exertional shortness of breath for the past 6 months. Current smoker. History of asthma  EXAM: CHEST - 2 VIEW  COMPARISON:  PA and lateral chest x-ray of April 26, 2016  FINDINGS: The lungs are borderline hypoinflated. The interstitial markings are increased. There is no alveolar infiltrate. There is no pleural effusion. Heart is top-normal in size. The pulmonary vascularity is normal. The mediastinum is normal in width.  IMPRESSION: Mild hypoinflation. Interstitial prominence bilaterally is not new and is accentuated by the borderline hypoinflation. No acute pneumonia nor CHF.   Electronically Signed   By: David  Martinique M.D.   On: 12/27/2017 16:21   Result History         Still quite sob on exertion  Rarely coughs No phlegm  No fever  Still feels like her uri is improved  Minimal smoker- 5 cig per week  Started in her 67s but quit for 10 years   The wellbutrin has helped mood as well inc to 300 mg   Patient Active Problem List   Diagnosis Date Noted  . SOB (shortness of breath) on exertion 12/21/2017  . Carpal tunnel syndrome 12/21/2017  . Stress incontinence 12/21/2017  . Paresthesia of both feet 07/04/2017  . Always thirsty 07/04/2017  .  Cough 05/02/2016  . Abdominal bloating 12/01/2015  . Fatigue 12/01/2015  . Vitamin B12 deficiency 12/01/2015  . Lymphocytic colitis 01/16/2015  . Anemia, iron deficiency 01/16/2015  . Anxiety state 12/20/2014  . Diarrhea 11/27/2014  . Insomnia 11/27/2014  . History of DVT (deep vein thrombosis) 09/17/2014  . Sleep disorder 09/02/2014  . Gastritis, chronic 06/06/2014  . Unspecified gastritis and gastroduodenitis without mention of hemorrhage 05/03/2014  . Abdominal pain, epigastric 04/19/2014  . Internal hemorrhoids with other complication 29/52/8413  . Class 3 severe obesity due to excess calories with serious comorbidity and body mass index (BMI) of 40.0 to 44.9 in adult ([place]) 06/27/2013  . Routine general medical examination at a health care facility 10/14/2011  . Thyroid nodule 05/16/2011  . Rectal bleeding 11/24/2010  . Hemorrhoids, internal 11/24/2010  . Irritable bowel syndrome (IBS) 11/24/2010  . ANEMIA 10/02/2009  . Hyperlipidemia 07/18/2009  . Essential hypertension 07/18/2009  . DYSPNEA ON EXERTION 07/18/2009  . THYROID CYST 01/31/2008  . ANXIETY 01/31/2008  . Depression 01/31/2008  . GASTRIC ULCER, HX OF 01/31/2008  . FIBROIDS, UTERUS 11/13/2007  . ALLERGIC RHINITIS 11/13/2007  . ASTHMA 11/13/2007  . GERD 11/13/2007  . IRRITABLE BOWEL SYNDROME 11/13/2007  . INTERNAL HEMORRHOIDS 11/12/2003   Past Medical History:  Diagnosis Date  . Abdominal pain, unspecified site   . Allergic rhinitis   . Anxiety   . Asthma   .  Depression   . DVT (deep venous thrombosis) ([place]) 2016  . Fibroid uterus   . GERD (gastroesophageal reflux disease)   . Hypercholesterolemia   . Hypertension   . Internal hemorrhoid   . Irritable bowel syndrome   . Lymphocytic colitis 01/02/2015  . Pneumonia 1998  . Ulcerative colitis ([place])   . URI (upper respiratory infection)   . Uterine fibroid   . Vitamin B12 deficiency    Past Surgical History:  Procedure Laterality Date  . CESAREAN  SECTION  2004  . CHOLECYSTECTOMY     laparoscopic "gallstones"  . ESOPHAGOGASTRODUODENOSCOPY (EGD) WITH PROPOFOL N/A 05/03/2014   Procedure: ESOPHAGOGASTRODUODENOSCOPY (EGD) WITH PROPOFOL;  Surgeon: Inda Castle, MD;  Location: [facility];  Service: Endoscopy;  Laterality: N/A;  . MYOMECTOMY    . UTERINE FIBROID SURGERY     Social History   Tobacco Use  . Smoking status: Light Tobacco Smoker    Types: Cigarettes  . Smokeless tobacco: Never Used  Substance Use Topics  . Alcohol use: Yes    Alcohol/week: 6.6 oz    Types: 10 Cans of beer, 1 Standard drinks or equivalent per week    Comment: occ  . Drug use: No   Family History  Problem Relation Age of Onset  . Colon cancer Paternal Aunt   . Uterine cancer Paternal Grandmother        with mets to the colon   . Heart disease Maternal Grandmother   . Heart disease Paternal Uncle   . Heart disease Paternal Aunt   . Kidney disease Cousin    Allergies  Allergen Reactions  . Sertraline Hcl     REACTION: diarrhea  . Zoloft [Sertraline Hcl] Diarrhea   Current Outpatient Medications on File Prior to Visit  Medication Sig Dispense Refill  . atorvastatin (LIPITOR) 10 MG tablet Take 1 tablet (10 mg total) daily by mouth. 30 tablet 11  . buPROPion (WELLBUTRIN XL) 300 MG 24 hr tablet Take 1 tablet (300 mg total) by mouth daily. 30 tablet 11  . escitalopram (LEXAPRO) 10 MG tablet Take 1 tablet (10 mg total) daily by mouth. 30 tablet 11  . fexofenadine (ALLEGRA) 180 MG tablet Take 180 mg by mouth daily.    . fluticasone (FLONASE) 50 MCG/ACT nasal spray Place 2 sprays into both nostrils daily. (Patient taking differently: Place 2 sprays daily as needed into both nostrils. ) 16 g 6  . hydrochlorothiazide (MICROZIDE) 12.5 MG capsule Take 1 capsule (12.5 mg total) by mouth daily. 30 capsule 11  . losartan (COZAAR) 50 MG tablet Take 1 tablet (50 mg total) by mouth daily. (Patient not taking: Reported on 01/12/2018) 30 tablet 11  . mesalamine  (LIALDA) 1.2 g EC tablet TAKE 2 TABLETS BY MOUTH TWICE A DAY 60 tablet 7  . pantoprazole (PROTONIX) 40 MG tablet Take 1 tablet (40 mg total) by mouth daily. 90 tablet 2  . zolpidem (AMBIEN CR) 12.5 MG CR tablet Take 1 tablet (12.5 mg total) at bedtime as needed by mouth. for sleep 30 tablet 3   No current facility-administered medications on file prior to visit.      Review of Systems  Constitutional: Positive for fatigue. Negative for activity change, appetite change, fever and unexpected weight change.  HENT: Negative for congestion, ear pain, rhinorrhea, sinus pressure and sore throat.   Eyes: Negative for pain, redness and visual disturbance.  Respiratory: Positive for shortness of breath. Negative for cough, choking, chest tightness, wheezing and stridor.   Cardiovascular:  Negative for chest pain and palpitations.  Gastrointestinal: Negative for abdominal pain, blood in stool, constipation and diarrhea.  Endocrine: Negative for polydipsia and polyuria.  Genitourinary: Negative for dysuria, frequency and urgency.  Musculoskeletal: Negative for arthralgias, back pain and myalgias.  Skin: Negative for pallor and rash.  Allergic/Immunologic: Negative for environmental allergies.  Neurological: Negative for dizziness, syncope and headaches.  Hematological: Negative for adenopathy. Does not bruise/bleed easily.  Psychiatric/Behavioral: Positive for dysphoric mood. Negative for decreased concentration. The patient is nervous/anxious.        Objective:   Physical Exam  Constitutional: She appears well-developed and well-nourished. No distress.  obese and well appearing   HENT:  Head: Normocephalic and atraumatic.  Mouth/Throat: Oropharynx is clear and moist.  Eyes: Pupils are equal, round, and reactive to light. Conjunctivae and EOM are normal.  Neck: Normal range of motion. Neck supple. No JVD present. Carotid bruit is not present. No thyromegaly present.  Cardiovascular: Normal rate,  regular rhythm, normal heart sounds and intact distal pulses. Exam reveals no gallop.  Pulmonary/Chest: Effort normal and breath sounds normal. No stridor. No respiratory distress. She has no wheezes. She has no rales. She exhibits no tenderness.  No crackles  Good air exch No wheeze even on forced exp   Abdominal: Soft. Bowel sounds are normal. She exhibits no distension, no abdominal bruit and no mass. There is no tenderness.  Musculoskeletal: She exhibits no edema.  Lymphadenopathy:    She has no cervical adenopathy.  Neurological: She is alert. She has normal reflexes.  Skin: Skin is warm and dry. No rash noted.  Psychiatric: Her speech is normal and behavior is normal. Thought content normal. Her mood appears anxious. She exhibits a depressed mood.  Pt is very anxious re: her breathing issues  Depressed mood is improved  Candidly discussed stressors           Assessment & Plan:   Problem List Items Addressed This Visit      Other   Cough    With ongoing sob on exertion  Mild restrictive pattern on spirometry  Ref to pulmonary      Relevant Orders   Spirometry with graph (Completed)   Depression with anxiety    Improved depressive symptoms with inc wellbutrin Reviewed stressors/ coping techniques/symptoms/ support sources/ tx options and side effects in detail today More anxious regarding her resp symptoms- was ref to pulm for this  Stressed imp of self care       Smoker    Light smoker  Is motivated to quit  Disc in detail risks of smoking and possible outcomes including copd, vascular/ heart disease, cancer , respiratory and sinus infections  Pt voices understanding       SOB (shortness of breath) on exertion - Primary    Ongoing and very worrisome to pt  Reassuring exam  Reassuring echocardiogram  Mild restrictive pattern on spirometry  Refer to pulmonary  Pt does have fam hx of pulm fibrosis       Relevant Orders   Ambulatory referral to Pulmonology      Other Visit Diagnoses    Wheezing       Relevant Orders   Spirometry with graph (Completed)

## 2018-01-11 NOTE — Patient Instructions (Signed)
Let's go ahead and refer you to pulmonary for further lung evaluation   Try going for 5 minute walks -see how you do   Eat healthy  Work on weight loss   Tests are reassuring so far

## 2018-01-12 ENCOUNTER — Encounter: Payer: Self-pay | Admitting: Internal Medicine

## 2018-01-12 ENCOUNTER — Ambulatory Visit: Payer: BLUE CROSS/BLUE SHIELD | Admitting: Internal Medicine

## 2018-01-12 VITALS — BP 118/70 | HR 96 | Ht 63.0 in | Wt 238.4 lb

## 2018-01-12 DIAGNOSIS — R0683 Snoring: Secondary | ICD-10-CM

## 2018-01-12 DIAGNOSIS — R0602 Shortness of breath: Secondary | ICD-10-CM | POA: Diagnosis not present

## 2018-01-12 DIAGNOSIS — R05 Cough: Secondary | ICD-10-CM

## 2018-01-12 DIAGNOSIS — R400 Somnolence: Secondary | ICD-10-CM

## 2018-01-12 DIAGNOSIS — R053 Chronic cough: Secondary | ICD-10-CM

## 2018-01-12 DIAGNOSIS — R0609 Other forms of dyspnea: Secondary | ICD-10-CM | POA: Diagnosis not present

## 2018-01-12 NOTE — Progress Notes (Signed)
Subjective:    Patient ID: Carol Johnson, female    DOB: Jan 03, 1966, 52 y.o.   MRN: [id number]   PCP Tower, Wynelle Fanny, MD  HPI  IOV 01/12/2018  Chief Complaint  Patient presents with  . Consult    Referred by Dr. Glori Bickers for SOB. Pt does have a hx of asthma.  Pt states she has had problems with SOB for 6 months but it has become worse.    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  She says that her father who died 34 years ago died from pulmonary fibrosis and a cousin in [place] has had pulmonary fibrosis.  She is extremely concerned about this etiology.  But her main symptom is 1 of shortness of breath that has been going on for at least 6 months and progressively getting worse.  Present with exertion such as walking from the apartment to the car and relieved by rest.  Although there is some variability in that some days it is more severe in some days it is absent but the consistent team is that it is present on exertion and relieved by rest.  There is no orthopnea proximal nocturnal dyspnea edema or fever or chills or hemoptysis.  There is some mild cough intermittent but is also present.  She is on losartan but not on ACE inhibitor. She is a smoker  She also denotes that she has gained at least 50 pounds of weight in the last 1 year.  Recent echocardiogram showed good vigorous systolic function but no comment about diastolic dysfunction.  She is not physically active.  She does admit to heavy snoring.  She reports that sometimes her son comes and wakes her up in the middle of the night saying she is snoring to rule out.  Nobody has witnessed any apneic spells but that at times when she is woken up in the middle of the night feeling something went wrong.  In the daytime she said there is one time while presenting a meeting she was falling asleep inexplicably.  She  can easily fall asleep after a heavy meal or while waiting for somebody or talking to people. Epworth scor eis 17 and high  Prior imaging personally visualized August 2015 CT abdomen lung cuts: No evidence of interstitial lung disease on my personal visualization and opinion.  She did have a chest x-ray December 27, 2017 this year 2 weeks ago: There seems to be mild interstitial prominence but this could be reflective of underinflation secondary to her obesity with body mass index 42.  On Jan 05, 2018 she had a echocardiogram that shows good ejection fraction no reports of pulmonary hypertension or diastolic dysfunction. Blood labs  December 21, 2017 normal creatinine 0.69 mg percent and hemoglobin 11.9 g%   Walking desaturation test she did not desaturate today Simple office walk 185 feet x  3 laps goal with forehead probe 01/12/18   O2 used yes  Number laps completed 3  Comments about pace Normal pace  Resting Pulse Ox/HR 98% and 91/min  Final Pulse Ox/HR 98% and 124/min  Desaturated </= 88% no  Desaturated <= 3% points no  Got Tachycardic >/= 90/min yes  Symptoms at end of test Not recorded  Miscellaneous comments none      has a past medical history of Abdominal pain, unspecified site, Allergic rhinitis, Anxiety,  Asthma, Depression, DVT (deep venous thrombosis) ([place]) (2016), Fibroid uterus, GERD (gastroesophageal reflux disease), Hypercholesterolemia, Hypertension, Internal hemorrhoid, Irritable bowel syndrome, Lymphocytic colitis (01/02/2015), Pneumonia (1998), Ulcerative colitis ([place]), URI (upper respiratory infection), Uterine fibroid, and Vitamin B12 deficiency.   reports that she has been smoking cigarettes.  She has never used smokeless tobacco.  Past Surgical History:  Procedure Laterality Date  . CESAREAN SECTION  2004  . CHOLECYSTECTOMY     laparoscopic "gallstones"  . ESOPHAGOGASTRODUODENOSCOPY (EGD) WITH PROPOFOL N/A 05/03/2014   Procedure: ESOPHAGOGASTRODUODENOSCOPY (EGD) WITH  PROPOFOL;  Surgeon: Inda Castle, MD;  Location: [facility];  Service: Endoscopy;  Laterality: N/A;  . MYOMECTOMY    . UTERINE FIBROID SURGERY      Allergies  Allergen Reactions  . Sertraline Hcl     REACTION: diarrhea  . Zoloft [Sertraline Hcl] Diarrhea    Immunization History  Administered Date(s) Administered  . Influenza Whole 07/21/2004  . Influenza,inj,Quad PF,6+ Mos 06/26/2013, 09/02/2014  . Influenza-Unspecified 06/20/2017  . Pneumococcal Polysaccharide-23 10/20/2004, 07/04/2017  . Td 06/21/2003  . Tdap 06/26/2013    Family History  Problem Relation Age of Onset  . Colon cancer Paternal Aunt   . Uterine cancer Paternal Grandmother        with mets to the colon   . Heart disease Maternal Grandmother   . Heart disease Paternal Uncle   . Heart disease Paternal Aunt   . Kidney disease Cousin      Current Outpatient Medications:  .  atorvastatin (LIPITOR) 10 MG tablet, Take 1 tablet (10 mg total) daily by mouth., Disp: 30 tablet, Rfl: 11 .  buPROPion (WELLBUTRIN XL) 300 MG 24 hr tablet, Take 1 tablet (300 mg total) by mouth daily., Disp: 30 tablet, Rfl: 11 .  escitalopram (LEXAPRO) 10 MG tablet, Take 1 tablet (10 mg total) daily by mouth., Disp: 30 tablet, Rfl: 11 .  fexofenadine (ALLEGRA) 180 MG tablet, Take 180 mg by mouth daily., Disp: , Rfl:  .  fluticasone (FLONASE) 50 MCG/ACT nasal spray, Place 2 sprays into both nostrils daily. (Patient taking differently: Place 2 sprays daily as needed into both nostrils. ), Disp: 16 g, Rfl: 6 .  hydrochlorothiazide (MICROZIDE) 12.5 MG capsule, Take 1 capsule (12.5 mg total) by mouth daily., Disp: 30 capsule, Rfl: 11 .  mesalamine (LIALDA) 1.2 g EC tablet, TAKE 2 TABLETS BY MOUTH TWICE A DAY, Disp: 60 tablet, Rfl: 7 .  pantoprazole (PROTONIX) 40 MG tablet, Take 1 tablet (40 mg total) by mouth daily., Disp: 90 tablet, Rfl: 2 .  zolpidem (AMBIEN CR) 12.5 MG CR tablet, Take 1 tablet (12.5 mg total) at bedtime as needed by  mouth. for sleep, Disp: 30 tablet, Rfl: 3 .  losartan (COZAAR) 50 MG tablet, Take 1 tablet (50 mg total) by mouth daily. (Patient not taking: Reported on 01/12/2018), Disp: 30 tablet, Rfl: 11   Review of Systems  Constitutional: Positive for unexpected weight change. Negative for fever.  HENT: Positive for congestion, ear pain, postnasal drip and sinus pressure. Negative for dental problem, nosebleeds, rhinorrhea, sneezing, sore throat and trouble swallowing.   Eyes: Negative for redness and itching.  Respiratory: Positive for cough, shortness of breath and wheezing. Negative for chest tightness.   Cardiovascular: Positive for palpitations and leg swelling.  Gastrointestinal: Negative for nausea and vomiting.  Genitourinary: Negative for dysuria.  Musculoskeletal: Negative for joint swelling.  Skin: Negative for rash.  Allergic/Immunologic: Negative.  Negative for environmental allergies, food allergies and immunocompromised state.  Neurological: Positive  for headaches.  Hematological: Does not bruise/bleed easily.  Psychiatric/Behavioral: Positive for dysphoric mood. The patient is not nervous/anxious.        Objective:   Physical Exam  Constitutional: She is oriented to person, place, and time. She appears well-developed and well-nourished. No distress.  obese  HENT:  Head: Normocephalic and atraumatic.  Right Ear: External ear normal.  Left Ear: External ear normal.  Mouth/Throat: Oropharynx is clear and moist. No oropharyngeal exudate.  mallampatti class 3  Eyes: Pupils are equal, round, and reactive to light. Conjunctivae and EOM are normal. Right eye exhibits no discharge. Left eye exhibits no discharge. No scleral icterus.  Neck: Normal range of motion. Neck supple. No JVD present. No tracheal deviation present. No thyromegaly present.  Cardiovascular: Normal rate, regular rhythm, normal heart sounds and intact distal pulses. Exam reveals no gallop and no friction rub.  No  murmur heard. Pulmonary/Chest: Effort normal and breath sounds normal. No respiratory distress. She has no wheezes. She has no rales. She exhibits no tenderness.  Abdominal: Soft. Bowel sounds are normal. She exhibits no distension and no mass. There is no tenderness. There is no rebound and no guarding.  Musculoskeletal: Normal range of motion. She exhibits no edema or tenderness.  Lymphadenopathy:    She has no cervical adenopathy.  Neurological: She is alert and oriented to person, place, and time. She has normal reflexes. No cranial nerve deficit. She exhibits normal muscle tone. Coordination normal.  Skin: Skin is warm and dry. No rash noted. She is not diaphoretic. No erythema. No pallor.  Dry skin  Psychiatric: She has a normal mood and affect. Her behavior is normal. Judgment and thought content normal.  Was transiently napping when I walked into the office room  Vitals reviewed.   Vitals:   01/12/18 1544  BP: 118/70  Pulse: 96  SpO2: 95%  Weight: 238 lb 6.4 oz (108.1 kg)  Height: 5' 3"  (1.6 m)    Estimated body mass index is 42.23 kg/m as calculated from the following:   Height as of this encounter: 5' 3"  (1.6 m).   Weight as of this encounter: 238 lb 6.4 oz (108.1 kg).        Assessment & Plan:     ICD-10-CM   1. Dyspnea on exertion R06.09   2. Chronic cough R05   3. Snoring R06.83   4. Daytime somnolence R40.0    Dyspnea: I think most of the dyspnea stems from obesity and or this diastolic dysfunction and the 50 pound weight gain in the last year along with physical deconditioning.  The cough could be occult asthma or losartan related.  We will start by getting a pulmonary function test and also high-resolution CT chest because she is very concerned about pulmonary fibrosis especially because it runs in the family.  Based on prior imaging I think the probability for pulmonary fibrosis very low.  Snoring and daytime somnolence:With epworth at 65,  It appears she is  high pretest probability for sleep apnea and therefore will refer to sleep specialist with in our practice.  She is agreeable with this plan   Dyspnea on exertion Chronic cough  - do HRCT supine and prone - let [facility] know you want this very soon in any location in [place]  - do full PFT - return to see me or APP after above - consider feno testing and quit smoking counseling at followup  Snoring Daytime somnolence - do epworth sleepiness questionnaire  - refer  sleep doc in our office  Followup  -return to see me or an app next few weeks after CT and PFT   - might consider FeNO testing at followup based on results - definitely see sleep doc in our office asap    Dr. Brand Males, M.D., [facility].C.P Pulmonary and Critical Care Medicine Staff Physician, [place] Director - Interstitial Lung Disease  Program  Pulmonary [place] at [place], [place], [address]  Pager: [phone], If no answer or between  15:00h - 7:00h: call [phone] Telephone: [phone]

## 2018-01-12 NOTE — Patient Instructions (Addendum)
ICD-10-CM   1. Dyspnea on exertion R06.09   2. Chronic cough R05   3. Snoring R06.83   4. Daytime somnolence R40.0    Dyspnea on exertion Chronic cough  - do HRCT supine and prone - let [facility] know you want this very soon in any location in [place]  - do full PFT - return to see me or APP after above - consider feno testing and quit smoking counseling at followup  Snoring Daytime somnolence - do epworth sleepiness questionnaire  - refer sleep doc in our office  Followup  -return to see me or an app next few weeks after CT and PFT   - might consider FeNO testing at followup based on results - definitely see sleep doc in our office asap

## 2018-01-15 DIAGNOSIS — Z87891 Personal history of nicotine dependence: Secondary | ICD-10-CM | POA: Insufficient documentation

## 2018-01-15 DIAGNOSIS — F17200 Nicotine dependence, unspecified, uncomplicated: Secondary | ICD-10-CM

## 2018-01-15 NOTE — Assessment & Plan Note (Signed)
Pt is more anxious today-regarding her breathing problem  Ref for further eval  Dep symptoms improved with inc wellbutrin

## 2018-01-15 NOTE — Assessment & Plan Note (Signed)
Ongoing and very worrisome to pt  Reassuring exam  Reassuring echocardiogram  Mild restrictive pattern on spirometry  Refer to pulmonary  Pt does have fam hx of pulm fibrosis

## 2018-01-15 NOTE — Assessment & Plan Note (Signed)
With ongoing sob on exertion  Mild restrictive pattern on spirometry  Ref to pulmonary

## 2018-01-15 NOTE — Assessment & Plan Note (Signed)
Improved depressive symptoms with inc wellbutrin Reviewed stressors/ coping techniques/symptoms/ support sources/ tx options and side effects in detail today More anxious regarding her resp symptoms- was ref to pulm for this  Stressed imp of self care

## 2018-01-15 NOTE — Assessment & Plan Note (Signed)
Light smoker  Is motivated to quit  Disc in detail risks of smoking and possible outcomes including copd, vascular/ heart disease, cancer , respiratory and sinus infections  Pt voices understanding

## 2018-01-16 ENCOUNTER — Ambulatory Visit (HOSPITAL_COMMUNITY): Payer: BLUE CROSS/BLUE SHIELD

## 2018-01-16 ENCOUNTER — Ambulatory Visit (HOSPITAL_COMMUNITY)
Admission: RE | Admit: 2018-01-16 | Discharge: 2018-01-16 | Disposition: A | Discharge status: Home / Self Care | Payer: BLUE CROSS/BLUE SHIELD | Source: Ambulatory Visit | Attending: Internal Medicine | Admitting: Internal Medicine

## 2018-01-16 DIAGNOSIS — R0602 Shortness of breath: Secondary | ICD-10-CM | POA: Diagnosis not present

## 2018-01-16 DIAGNOSIS — K760 Fatty (change of) liver, not elsewhere classified: Secondary | ICD-10-CM | POA: Diagnosis not present

## 2018-01-16 DIAGNOSIS — J45909 Unspecified asthma, uncomplicated: Secondary | ICD-10-CM | POA: Diagnosis not present

## 2018-01-16 DIAGNOSIS — I2510 Atherosclerotic heart disease of native coronary artery without angina pectoris: Secondary | ICD-10-CM | POA: Diagnosis not present

## 2018-01-16 DIAGNOSIS — R918 Other nonspecific abnormal finding of lung field: Secondary | ICD-10-CM | POA: Diagnosis not present

## 2018-01-16 DIAGNOSIS — R0600 Dyspnea, unspecified: Secondary | ICD-10-CM | POA: Diagnosis not present

## 2018-01-19 ENCOUNTER — Encounter: Payer: Self-pay | Admitting: Family Medicine

## 2018-01-19 MED ORDER — ZOLPIDEM TARTRATE ER 12.5 MG PO TBCR: 12.5000 mg | EXTENDED_RELEASE_TABLET | Freq: Every evening | ORAL | 3 refills | Status: DC | PRN

## 2018-01-19 NOTE — Telephone Encounter (Signed)
ambien refill

## 2018-01-20 ENCOUNTER — Telehealth: Payer: Self-pay | Admitting: Internal Medicine

## 2018-01-20 NOTE — Telephone Encounter (Signed)
Seeing her 01/27/18 for followup. Ensure she keeps it to discuss results. Findings are  [address] [place] [address]   1. D/w Dr Blietz  - non specific inflammatory changes - she should do Serum: ESR, ACE, ANA, DS-DNA, RF, anti-CCP, ssA, ssB, scl-70, ANCA screen, MPO, PR-3, Total CK,  RNP, Aldolase,  Hypersensitivity Pneumonitis Panel - DO THIS IF POSSIBLE 4 DAYS BEFORE seeing me. If not, I will do it at time of followup  2. Coronary artery calcirication 0- will discuss at followup   3. Fatty liver - will discuss at followup     

## 2018-01-26 ENCOUNTER — Encounter: Payer: Self-pay | Admitting: Internal Medicine

## 2018-01-26 NOTE — Telephone Encounter (Signed)
Attempted to call pt to discuss results of her HRCT but unable to reach pt and unable to leave a message due to mailbox being full.  Pt is seeing MR tomorrow, 01/27/18 with a PFT prior. We will discuss the results of the scan at that visit.

## 2018-01-27 ENCOUNTER — Ambulatory Visit (INDEPENDENT_AMBULATORY_CARE_PROVIDER_SITE_OTHER): Payer: BLUE CROSS/BLUE SHIELD | Admitting: Internal Medicine

## 2018-01-27 ENCOUNTER — Encounter: Payer: Self-pay | Admitting: Internal Medicine

## 2018-01-27 ENCOUNTER — Other Ambulatory Visit (INDEPENDENT_AMBULATORY_CARE_PROVIDER_SITE_OTHER): Payer: BLUE CROSS/BLUE SHIELD

## 2018-01-27 ENCOUNTER — Ambulatory Visit: Payer: BLUE CROSS/BLUE SHIELD | Admitting: Internal Medicine

## 2018-01-27 VITALS — BP 120/80 | HR 97 | Ht 62.25 in | Wt 240.0 lb

## 2018-01-27 DIAGNOSIS — R918 Other nonspecific abnormal finding of lung field: Secondary | ICD-10-CM

## 2018-01-27 DIAGNOSIS — R0602 Shortness of breath: Secondary | ICD-10-CM | POA: Diagnosis not present

## 2018-01-27 DIAGNOSIS — Z836 Family history of other diseases of the respiratory system: Secondary | ICD-10-CM

## 2018-01-27 DIAGNOSIS — R0609 Other forms of dyspnea: Secondary | ICD-10-CM

## 2018-01-27 DIAGNOSIS — I2510 Atherosclerotic heart disease of native coronary artery without angina pectoris: Secondary | ICD-10-CM | POA: Diagnosis not present

## 2018-01-27 DIAGNOSIS — R05 Cough: Secondary | ICD-10-CM | POA: Diagnosis not present

## 2018-01-27 DIAGNOSIS — Z87891 Personal history of nicotine dependence: Secondary | ICD-10-CM

## 2018-01-27 DIAGNOSIS — R053 Chronic cough: Secondary | ICD-10-CM

## 2018-01-27 LAB — SEDIMENTATION RATE: Sed Rate: 47 mm/hr — ABNORMAL HIGH (ref 0–30)

## 2018-01-27 LAB — NITRIC OXIDE: NITRIC OXIDE: 20

## 2018-01-27 NOTE — Progress Notes (Signed)
Subjective:     Patient ID: Carol Johnson, female   DOB: 11/12/1965, 52 y.o.   MRN: [id number]  HPI PCP Tower, Carol Fanny, MD  HPI  IOV 01/12/2018  Chief Complaint  Patient presents with  . Consult    Referred by Dr. Glori Bickers for SOB. Pt does have a hx of asthma.  Pt states she has had problems with SOB for 6 months but it has become worse.    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  She says that her father who died 9 years ago died from pulmonary fibrosis and a cousin in [place] has had pulmonary fibrosis.  She is extremely concerned about this etiology.  But her main symptom is 1 of shortness of breath that has been going on for at least 6 months and progressively getting worse.  Present with exertion such as walking from the apartment to the car and relieved by rest.  Although there is some variability in that some days it is more severe in some days it is absent but the consistent team is that it is present on exertion and relieved by rest.  There is no orthopnea proximal nocturnal dyspnea edema or fever or chills or hemoptysis.  There is some mild cough intermittent but is also present.  She is on losartan but not on ACE inhibitor. She is a smoker  She also denotes that she has gained at least 50 pounds of weight in the last 1 year.  Recent echocardiogram showed good vigorous systolic function but no comment about diastolic dysfunction.  She is not physically active.  She does admit to heavy snoring.  She reports that sometimes her son comes and wakes her up in the middle of the night saying she is snoring to rule out.  Nobody has witnessed any apneic spells but that at times when she is woken up in the middle of the night feeling something went wrong.  In the daytime she said there is one time while presenting a meeting she was falling asleep inexplicably.   She can easily fall asleep after a heavy meal or while waiting for somebody or talking to people. Epworth scor eis 17 and high  Prior imaging personally visualized August 2015 CT abdomen lung cuts: No evidence of interstitial lung disease on my personal visualization and opinion.  She did have a chest x-ray December 27, 2017 this year 2 weeks ago: There seems to be mild interstitial prominence but this could be reflective of underinflation secondary to her obesity with body mass index 42.  On Jan 05, 2018 she had a echocardiogram that shows good ejection fraction no reports of pulmonary hypertension or diastolic dysfunction. Blood labs  December 21, 2017 normal creatinine 0.69 mg percent and hemoglobin 11.9 g%   Walking desaturation test she did not desaturate today Simple office walk 185 feet x  3 laps goal with forehead probe 01/12/18   O2 used yes  Number laps completed 3  Comments about pace Normal pace  Resting Pulse Ox/HR 98% and 91/min  Final Pulse Ox/HR 98% and 124/min  Desaturated </= 88% no  Desaturated <= 3% points no  Got Tachycardic >/= 90/min yes  Symptoms at end of test Not recorded  Miscellaneous comments none    OV 01/27/2018  Chief Complaint  Patient presents with  . Follow-up    HRCT  done 5/20 and PFT done today. Pt states symptoms are same since last visit.    Follow-up dyspnea cough, smoking history and family history of pulmonary fibrosis.  High risk for sleep apnea and sleep appointment pending  Here to discuss test results.  In the interim she had high-resolution CT chest that Dr. Salvatore Marvel interpreted as ILD but I took a second opinion. HRCT 2nd opinion from Dr Cari Caraway  -> "Basilar findings are very nonspecific - very mild scattered subpleural interlobular/intralobular septal thickening and ground glass, new from prior. I wouldn't be surprised if they resolved on subsequent imaging. So, indeterminate."  Patient is very anxious about these findings.  In terms of her  smoking history: She says she only smokes rarely.  Last cigarette was a month ago.  Since then she has not smoked.  We did discuss the possibility of RB ILD due to smoking.  She is willing to stay quit for 6 months.  In terms of her symptoms of shortness of breath and cough these persist unchanged: She does have coronary artery calcification a new finding on her CT scan.  Her exam nitric oxide testing was 20 ppb suggesting absence of asthma     has a past medical history of Abdominal pain, unspecified site, Allergic rhinitis, Anxiety, Asthma, Depression, DVT (deep venous thrombosis) ([place]) (2016), Fibroid uterus, GERD (gastroesophageal reflux disease), Hypercholesterolemia, Hypertension, Internal hemorrhoid, Irritable bowel syndrome, Lymphocytic colitis (01/02/2015), Pneumonia (1998), Ulcerative colitis ([place]), URI (upper respiratory infection), Uterine fibroid, and Vitamin B12 deficiency.   reports that she has been smoking cigarettes.  She has never used smokeless tobacco.  Past Surgical History:  Procedure Laterality Date  . CESAREAN SECTION  2004  . CHOLECYSTECTOMY     laparoscopic "gallstones"  . ESOPHAGOGASTRODUODENOSCOPY (EGD) WITH PROPOFOL N/A 05/03/2014   Procedure: ESOPHAGOGASTRODUODENOSCOPY (EGD) WITH PROPOFOL;  Surgeon: Inda Castle, MD;  Location: [facility];  Service: Endoscopy;  Laterality: N/A;  . MYOMECTOMY    . UTERINE FIBROID SURGERY      Allergies  Allergen Reactions  . Sertraline Hcl     REACTION: diarrhea  . Zoloft [Sertraline Hcl] Diarrhea    Immunization History  Administered Date(s) Administered  . Influenza Whole 07/21/2004  . Influenza,inj,Quad PF,6+ Mos 06/26/2013, 09/02/2014  . Influenza-Unspecified 06/20/2017  . Pneumococcal Polysaccharide-23 10/20/2004, 07/04/2017  . Td 06/21/2003  . Tdap 06/26/2013    Family History  Problem Relation Age of Onset  . Colon cancer Paternal Aunt   . Uterine cancer Paternal Grandmother        with mets to the  colon   . Heart disease Maternal Grandmother   . Heart disease Paternal Uncle   . Heart disease Paternal Aunt   . Kidney disease Cousin      Current Outpatient Medications:  .  atorvastatin (LIPITOR) 10 MG tablet, Take 1 tablet (10 mg total) daily by mouth., Disp: 30 tablet, Rfl: 11 .  buPROPion (WELLBUTRIN XL) 300 MG 24 hr tablet, Take 1 tablet (300 mg total) by mouth daily., Disp: 30 tablet, Rfl: 11 .  escitalopram (LEXAPRO) 10 MG tablet, Take 1 tablet (10 mg total) daily by mouth., Disp: 30 tablet, Rfl: 11 .  fexofenadine (ALLEGRA) 180 MG tablet, Take 180 mg by mouth daily., Disp: , Rfl:  .  fluticasone (FLONASE) 50 MCG/ACT nasal spray, Place 2 sprays into both nostrils daily. (Patient taking differently: Place 2 sprays daily as needed into both nostrils. ), Disp: 16 g, Rfl: 6 .  hydrochlorothiazide (MICROZIDE) 12.5 MG capsule,  Take 1 capsule (12.5 mg total) by mouth daily., Disp: 30 capsule, Rfl: 11 .  losartan (COZAAR) 50 MG tablet, Take 1 tablet (50 mg total) by mouth daily., Disp: 30 tablet, Rfl: 11 .  mesalamine (LIALDA) 1.2 g EC tablet, TAKE 2 TABLETS BY MOUTH TWICE A DAY, Disp: 60 tablet, Rfl: 7 .  pantoprazole (PROTONIX) 40 MG tablet, Take 1 tablet (40 mg total) by mouth daily., Disp: 90 tablet, Rfl: 2 .  zolpidem (AMBIEN CR) 12.5 MG CR tablet, Take 1 tablet (12.5 mg total) by mouth at bedtime as needed. for sleep, Disp: 30 tablet, Rfl: 3  Review of Systems     Objective:   Physical Exam Discussion only visit  Vitals:   01/27/18 1354  BP: 120/80  Pulse: 97  SpO2: 98%  Weight: 240 lb (108.9 kg)  Height: 5' 2.25" (1.581 m)    Estimated body mass index is 43.54 kg/m as calculated from the following:   Height as of this encounter: 5' 2.25" (1.581 m).   Weight as of this encounter: 240 lb (108.9 kg).      Assessment:       ICD-10-CM   1. Pulmonary infiltrates R91.8   2. Ground glass opacity present on imaging of lung R91.8   3. Smoking history Z87.891   4.  Family history of pulmonary fibrosis Z83.6   5. Coronary artery calcification seen on CAT scan I25.10   6. Chronic cough R05 Nitric oxide       Plan:     Pulmonary infiltrates Ground glass opacity present on imaging of lung Smoking history Family history of pulmonary fibrosis  - you have some kind of non-specific inflmmation in lungs. - It is very mild and does not account for your symptoms because breathing test is normal - cause not known but smoking mighgt be playing a role - need to rule out autoimmune disease: so do Serum: ESR, ACE, ANA, DS-DNA, RF, anti-CCP, ssA, ssB, scl-70, ANCA screen, MPO, PR-3, Total CK,  RNP, Aldolase,  Hypersensitivity Pneumonitis Panel - do this 01/27/2018 - repeat HRCTG in 6 months in absence of smoking   Coronary artery calcification seen on CAT scan Dyspnea  - refer cardiology to rule out cardiac reasons for shorntess of breath  Snoring  - keep up appt with Dr Elsworth Soho June 2019  Chronic cough - Feno Test - normal  Followup - in 6 months repeat HRCT supine and prone  - return for followup after HRCT  > 50% of this > 25 min visit spent in face to face counseling or coordination of care   Dr. Brand Males, M.D., [facility].C.P Pulmonary and Critical Care Medicine Staff Physician, [place] Director - Interstitial Lung Disease  Program  Pulmonary [place] at [place], [place], [address]  Pager: [phone], If no answer or between  15:00h - 7:00h: call [phone] Telephone: [phone]

## 2018-01-27 NOTE — Progress Notes (Signed)
PFT done today. 

## 2018-01-27 NOTE — Patient Instructions (Addendum)
Pulmonary infiltrates Ground glass opacity present on imaging of lung Smoking history Family history of pulmonary fibrosis  - you have some kind of non-specific inflmmation in lungs. - It is very mild and does not account for your symptoms because breathing test is normal - cause not known but smoking mighgt be playing a role - need to rule out autoimmune disease: so do Serum: ESR, ACE, ANA, DS-DNA, RF, anti-CCP, ssA, ssB, scl-70, ANCA screen, MPO, PR-3, Total CK,  RNP, Aldolase,  Hypersensitivity Pneumonitis Panel - do this 01/27/2018   Coronary artery calcification seen on CAT scan Dyspnea  - refer cardiology to rule out cardiac reasons for shorntess of breath  Snoring  - keep up appt with Dr Elsworth Soho June 2019  Chronic cough - Feno Test - normal  Followup - in 6 months repeat HRCT supine and prone - but in abasence of smoking  - return for followup after HRCT

## 2018-01-28 LAB — RNP ANTIBODIES: ENA RNP Ab: <0.2 AI (ref 0.0–0.9)

## 2018-02-01 ENCOUNTER — Telehealth: Payer: Self-pay | Admitting: Internal Medicine

## 2018-02-01 DIAGNOSIS — R768 Other specified abnormal immunological findings in serum: Secondary | ICD-10-CM

## 2018-02-01 NOTE — Telephone Encounter (Signed)
autoimmne panel -> 1:640, rest negative. Plan - refer to rheumatoogy to ensure no autoimmune disease

## 2018-02-01 NOTE — Telephone Encounter (Signed)
Called and spoke with pt to let her know the results of her labwork and based on one of the labs being elevated, we were going to refer her to rheumatology for further workup.  Pt expressed understanding. Nothing further needed at this time. Order placed for the referral.

## 2018-02-03 LAB — ALDOLASE: ALDOLASE: 3.7 U/L (ref <=8.1)

## 2018-02-03 LAB — ANGIOTENSIN CONVERTING ENZYME: Angiotensin-Converting Enzyme: 62 U/L (ref 9–67)

## 2018-02-03 LAB — ANA: Anti Nuclear Antibody(ANA): POSITIVE — AB

## 2018-02-03 LAB — CYCLIC CITRUL PEPTIDE ANTIBODY, IGG

## 2018-02-03 LAB — ANCA SCREEN W REFLEX TITER: ANCA SCREEN: NEGATIVE

## 2018-02-03 LAB — ANTI-NUCLEAR AB-TITER (ANA TITER)

## 2018-02-03 LAB — HYPERSENSITIVITY PNUEMONITIS PROFILE
ASPERGILLUS FUMIGATUS: NEGATIVE
FAENIA RETIVIRGULA: NEGATIVE
PIGEON SERUM: NEGATIVE
S. VIRIDIS: NEGATIVE
T. CANDIDUS: NEGATIVE
T. VULGARIS: NEGATIVE

## 2018-02-03 LAB — RHEUMATOID FACTOR: Rhuematoid fact SerPl-aCnc: <14 IU/mL (ref <14)

## 2018-02-03 LAB — CK TOTAL AND CKMB (NOT AT ARMC)
CK TOTAL: 89 U/L (ref 29–143)
CK, MB: 1.5 ng/mL (ref 0–5.0)
Relative Index: 1.7 (ref 0–4.0)

## 2018-02-03 LAB — MPO/PR-3 (ANCA) ANTIBODIES

## 2018-02-03 LAB — ANTI-DNA ANTIBODY, DOUBLE-STRANDED: ds DNA Ab: <1 IU/mL

## 2018-02-08 NOTE — Progress Notes (Signed)
New Outpatient Visit Date: 02/09/2018  Referring Provider: Brand Males, MD [address] [place], [place] [address]  Chief Complaint: Shortness of breath  HPI:  Carol Johnson is a 52 y.o. female who is being seen today for the evaluation of coronary artery calcification and shortness of breath at the request of Dr. Chase Caller. She has a history of hypertension, hyperlipidemia, DVT, asthma, irritable bowel syndrome, lymphocytic colitis, ulcerative colitis, and asthma.  She is undergoing evaluation of shortness of breath by Dr. Chase Caller, as part of which she underwent CT of the chest last month.  This was notable for coronary artery calcification in the LAD distribution.  Carol Johnson reports progressive dyspnea on exertion over the last 6 months, which is now present with only mild exertion such as walking through the grocery store.  She notes accompanying diaphoresis and a general unwell feeling.  She does not have shortness of breath at rest nor orthopnea but notes occasional PND.  She also denies chest pain, palpitations, and lightheadedness.  She has a history of mild dependent edema, which has been stable.  As part of workup for her symptoms, chest CT was performed showing patchy subpleural reticulation and ground-glass attenuation in both lungs with a slight basilar gradient, which may represent interstitial lung disease.  No treatment was instituted by Dr. Chase Caller, though Carol Johnson has been referred for sleep evaluation to exclude underlying OSA.  --------------------------------------------------------------------------------------------------  Cardiovascular History & Procedures: Cardiovascular Problems:  Coronary artery calcification  Shortness of breath  Risk Factors:  Hypertension, hyperlipidemia, morbid obesity, and tobacco abuse  Cath/PCI:  None  CV Surgery:  None  EP Procedures and Devices:  None  Non-Invasive Evaluation(s):  TTE (01/05/2018): Normal LV size  and wall thickness.  LVEF 65 to 70%.  Normal RV size and function.  Recent CV Pertinent Labs: Lab Results  Component Value Date   CHOL 240 (H) 08/04/2017   HDL 35.60 (L) 08/04/2017   LDLCALC 155 (H) 07/18/2015   LDLDIRECT 193.0 08/04/2017   TRIG 218.0 (H) 08/04/2017   CHOLHDL 7 08/04/2017   INR 1.2 (H) 01/02/2015   K 3.9 12/21/2017   BUN 13 12/21/2017   CREATININE 0.69 12/21/2017    --------------------------------------------------------------------------------------------------  Past Medical History:  Diagnosis Date  . Abdominal pain, unspecified site   . Allergic rhinitis   . Anxiety   . Asthma   . Depression   . DVT (deep venous thrombosis) ([place]) 2016  . Fibroid uterus   . GERD (gastroesophageal reflux disease)   . Hypercholesterolemia   . Hypertension   . Internal hemorrhoid   . Irritable bowel syndrome   . Lymphocytic colitis 01/02/2015  . Pneumonia 1998  . Ulcerative colitis ([place])   . URI (upper respiratory infection)   . Uterine fibroid   . Vitamin B12 deficiency     Past Surgical History:  Procedure Laterality Date  . CESAREAN SECTION  2004  . CHOLECYSTECTOMY     laparoscopic "gallstones"  . ESOPHAGOGASTRODUODENOSCOPY (EGD) WITH PROPOFOL N/A 05/03/2014   Procedure: ESOPHAGOGASTRODUODENOSCOPY (EGD) WITH PROPOFOL;  Surgeon: Inda Castle, MD;  Location: [facility];  Service: Endoscopy;  Laterality: N/A;  . MYOMECTOMY    . UTERINE FIBROID SURGERY      Current Meds  Medication Sig  . atorvastatin (LIPITOR) 10 MG tablet Take 1 tablet (10 mg total) daily by mouth.  Marland Kitchen buPROPion (WELLBUTRIN XL) 300 MG 24 hr tablet Take 1 tablet (300 mg total) by mouth daily.  Marland Kitchen escitalopram (LEXAPRO) 10 MG tablet  Take 1 tablet (10 mg total) daily by mouth.  . fexofenadine (ALLEGRA) 180 MG tablet Take 180 mg by mouth daily.  . fluticasone (FLONASE) 50 MCG/ACT nasal spray Place 2 sprays into both nostrils daily. (Patient taking differently: Place 2 sprays daily as needed  into both nostrils. )  . hydrochlorothiazide (MICROZIDE) 12.5 MG capsule Take 1 capsule (12.5 mg total) by mouth daily.  Marland Kitchen losartan (COZAAR) 50 MG tablet Take 1 tablet (50 mg total) by mouth daily.  . mesalamine (LIALDA) 1.2 g EC tablet TAKE 2 TABLETS BY MOUTH TWICE A DAY  . pantoprazole (PROTONIX) 40 MG tablet Take 1 tablet (40 mg total) by mouth daily.  Marland Kitchen zolpidem (AMBIEN CR) 12.5 MG CR tablet Take 1 tablet (12.5 mg total) by mouth at bedtime as needed. for sleep    Allergies: Sertraline hcl and Zoloft [sertraline hcl]  Social History   Tobacco Use  . Smoking status: Light Tobacco Smoker    Types: Cigarettes  . Smokeless tobacco: Never Used  Substance Use Topics  . Alcohol use: Yes    Alcohol/week: 6.6 oz    Types: 10 Cans of beer, 1 Standard drinks or equivalent per week    Comment: occ  . Drug use: No    Family History  Problem Relation Age of Onset  . Colon cancer Paternal Aunt   . Uterine cancer Paternal Grandmother        with mets to the colon   . Heart disease Maternal Grandmother   . Heart disease Paternal Uncle   . Heart disease Paternal Aunt   . Kidney disease Cousin     Review of Systems: Review of Systems  Constitutional: Positive for diaphoresis and malaise/fatigue.  HENT: Negative.   Eyes: Negative.   Respiratory: Positive for shortness of breath.   Cardiovascular: Positive for leg swelling and PND.  Gastrointestinal: Negative.   Genitourinary: Negative.   Musculoskeletal: Positive for back pain.  Skin: Negative.   Neurological: Positive for dizziness.  Endo/Heme/Allergies: Negative.   Psychiatric/Behavioral: Positive for depression.   --------------------------------------------------------------------------------------------------  Physical Exam: BP 106/74   Pulse 90   Ht 5' 3"  (1.6 m)   Wt 241 lb (109.3 kg)   LMP 11/29/2015   SpO2 99%   BMI 42.69 kg/m   General:  NAD HEENT: No conjunctival pallor or scleral icterus. Moist mucous  membranes. OP clear. Neck: Supple without lymphadenopathy, thyromegaly, JVD, or HJR. No carotid bruit. Lungs: Normal work of breathing. Clear to auscultation bilaterally without wheezes or crackles. Heart: Regular rate and rhythm without murmurs, rubs, or gallops. Unable to assess PMI due to body habitus. Abd: Bowel sounds present. Soft, NT/ND.  Unable to assess HSM due to body habitus. Ext: No lower extremity edema. Radial, PT, and DP pulses are 2+ bilaterally Skin: Warm and dry without rash. Neuro: CNIII-XII intact. Strength and fine-touch sensation intact in upper and lower extremities bilaterally. Psych: Normal mood and affect.  EKG:  NSR with minimal inferior q-waves.  Otherwise, no significant abnormality.  Lab Results  Component Value Date   WBC 14.1 (H) 12/21/2017   HGB 11.9 (L) 12/21/2017   HCT 36.5 12/21/2017   MCV 80.0 12/21/2017   PLT 406.0 (H) 12/21/2017    Lab Results  Component Value Date   NA 137 12/21/2017   K 3.9 12/21/2017   CL 101 12/21/2017   CO2 31 12/21/2017   BUN 13 12/21/2017   CREATININE 0.69 12/21/2017   GLUCOSE 77 12/21/2017   ALT 14 12/21/2017  Lab Results  Component Value Date   CHOL 240 (H) 08/04/2017   HDL 35.60 (L) 08/04/2017   LDLCALC 155 (H) 07/18/2015   LDLDIRECT 193.0 08/04/2017   TRIG 218.0 (H) 08/04/2017   CHOLHDL 7 08/04/2017     --------------------------------------------------------------------------------------------------  ASSESSMENT AND PLAN: Dyspnea on exertion Progressive over the last 6 months and most likely due to combination of lung pathology noted on recent CT, as well as deconditioning and morbid obesity.  Coronary insufficiency is a consideration.  I have personally reviewed her CT chest from 01/16/18, which shows coronary artery calcification involving the LAD and left main, though overall calcium burden is relatively low.  We have discussed further testing options, including myocardial perfusion imaging stress  test (the patient is unable to exercise), coronary CTA, and cardiac catheterization.  I am concerned that the sensitivity and specificity of a SPECT study would be poor, given Carol Johnson's morbid obesity.  She is also reluctant to proceed with catheterization.  We have therefore agreed to perform a coronary CTA.  I will have her begin metoprolol succinate 25 mg daily 2 days before the study and also decrease losartan to 25 mg daily during that time in order to optimize her heart rate for the study without dropping her blood pressure too much.  Coronary artery calcification As above, we will obtain a cardiac CTA for further assessment.  Carol Johnson should continue aspirin and atorvastatin for secondary prevention.  Hypertension Blood pressure well-controlled.  We will start metoprolol succinate 25 mg daily 2 days prior to cardiac CTA and decrease losartan to 25 mg daily at that time to minimize risk for hypotension.  Morbid obesity Likely contributing to her shortness of breath.  Sleep study certainly seems appropriate given strong suspicion for sleep apnea.  I have encouraged weight loss though diet.  If no significant CAD is noted on cardiac CTA, beginning a regular exercise regimen would also be beneficial.  Follow-up: Return to clinic in 6 weeks.  Nelva Bush, MD 02/08/2018 9:16 PM

## 2018-02-09 ENCOUNTER — Encounter: Payer: Self-pay | Admitting: Internal Medicine

## 2018-02-09 ENCOUNTER — Ambulatory Visit: Payer: BLUE CROSS/BLUE SHIELD | Admitting: Internal Medicine

## 2018-02-09 VITALS — BP 106/74 | HR 90 | Ht 63.0 in | Wt 241.0 lb

## 2018-02-09 DIAGNOSIS — I2584 Coronary atherosclerosis due to calcified coronary lesion: Secondary | ICD-10-CM

## 2018-02-09 DIAGNOSIS — R0602 Shortness of breath: Secondary | ICD-10-CM | POA: Diagnosis not present

## 2018-02-09 DIAGNOSIS — I10 Essential (primary) hypertension: Secondary | ICD-10-CM

## 2018-02-09 DIAGNOSIS — R0609 Other forms of dyspnea: Secondary | ICD-10-CM

## 2018-02-09 DIAGNOSIS — I2510 Atherosclerotic heart disease of native coronary artery without angina pectoris: Secondary | ICD-10-CM

## 2018-02-09 LAB — BASIC METABOLIC PANEL
BUN/Creatinine Ratio: 12 (ref 9–23)
BUN: 9 mg/dL (ref 6–24)
CALCIUM: 9.8 mg/dL (ref 8.7–10.2)
CHLORIDE: 102 mmol/L (ref 96–106)
CO2: 23 mmol/L (ref 20–29)
Creatinine, Ser: 0.76 mg/dL (ref 0.57–1.00)
GFR calc Af Amer: 104 mL/min/{1.73_m2} (ref >59)
GFR calc non Af Amer: 90 mL/min/{1.73_m2} (ref >59)
Glucose: 93 mg/dL (ref 65–99)
POTASSIUM: 4.2 mmol/L (ref 3.5–5.2)
Sodium: 139 mmol/L (ref 134–144)

## 2018-02-09 MED ORDER — METOPROLOL SUCCINATE ER 25 MG PO TB24: 25.0000 mg | ORAL_TABLET | Freq: Every day | ORAL | 3 refills | Status: DC

## 2018-02-09 MED ORDER — ASPIRIN EC 81 MG PO TBEC: 81.0000 mg | DELAYED_RELEASE_TABLET | Freq: Every day | ORAL | 3 refills | Status: DC

## 2018-02-09 NOTE — Patient Instructions (Addendum)
Medication Instructions:  Start Aspirin 81 mg daily by mouth Start Metoprolol Succinate 25 mg daily 2 days prior to Coronary CTA Reduce Losartan to 25 mg daily when starting Metoprolol Succinate  -- If you need a refill on your cardiac medications before your next appointment, please call your pharmacy. --  Labwork: today BMET    Testing/Procedures:PLEASE SCHEDULE CORONARY CTA  Follow-Up: Your physician wants you to follow-up in: 19 WEEKS with Dr. Saunders Revel.     Thank you for choosing [facility]!!    Any Other Special Instructions Will Be Listed Below (If Applicable).   Please arrive at the [facility] main entrance of [facility] at______ AM (30-45 minutes prior to test start time)  [facility] [place], [place] [address] [phone]  Proceed to the [facility] Radiology Department (First Floor).  Please follow these instructions carefully (unless otherwise directed):  On the Night Before the Test: . Drink plenty of water. . Do not consume any caffeinated/decaffeinated beverages or chocolate 12 hours prior to your test. . Do not take any antihistamines 12 hours prior to your test. On the Day of the Test: . Drink plenty of water. Do not drink any water within one hour of the test. . Do not eat any food 4 hours prior to the test. . You may take your regular medications prior to the test. After the Test: . Drink plenty of water. . After receiving IV contrast, you may experience a mild flushed feeling. This is normal. . On occasion, you may experience a mild rash up to 24 hours after the test. This is not dangerous. If this occurs, you can take Benadryl 25 mg and increase your fluid intake. . If you experience trouble breathing, this can be serious. If it is severe call 911 IMMEDIATELY. If it is mild, please call our office. . If you take any of these medications: Glipizide/Metformin, Avandament, Glucavance, please do not take 48 hours  after completing test.

## 2018-02-10 ENCOUNTER — Encounter: Payer: Self-pay | Admitting: Internal Medicine

## 2018-02-10 DIAGNOSIS — I2510 Atherosclerotic heart disease of native coronary artery without angina pectoris: Secondary | ICD-10-CM | POA: Insufficient documentation

## 2018-02-10 DIAGNOSIS — E669 Obesity, unspecified: Secondary | ICD-10-CM | POA: Insufficient documentation

## 2018-02-10 DIAGNOSIS — I2584 Coronary atherosclerosis due to calcified coronary lesion: Secondary | ICD-10-CM

## 2018-02-20 ENCOUNTER — Telehealth: Payer: Self-pay | Admitting: *Deleted

## 2018-02-20 NOTE — Telephone Encounter (Signed)
Received fax saying pt's lexapro is $20 out of pocket and pharmacy said pt is requesting an alt Rx that's cheaper. Alt fax listed are citalopram for $5, fluoxitine $6, paroxetine $8, and sertraline $8, please advise   [facility] [address]

## 2018-02-21 MED ORDER — CITALOPRAM HYDROBROMIDE 20 MG PO TABS: 20.0000 mg | ORAL_TABLET | Freq: Every day | ORAL | 11 refills | Status: DC

## 2018-02-21 NOTE — Telephone Encounter (Signed)
I sent citalopram - let me know if any problems/issues

## 2018-02-21 NOTE — Telephone Encounter (Signed)
Left VM letting pharmacy know of the change and to let us know if she has any problems

## 2018-02-23 ENCOUNTER — Ambulatory Visit: Payer: BLUE CROSS/BLUE SHIELD | Admitting: Pulmonary Disease

## 2018-02-23 ENCOUNTER — Encounter: Payer: Self-pay | Admitting: Pulmonary Disease

## 2018-02-23 VITALS — BP 126/78 | HR 83 | Ht 63.0 in | Wt 241.3 lb

## 2018-02-23 DIAGNOSIS — G4733 Obstructive sleep apnea (adult) (pediatric): Secondary | ICD-10-CM

## 2018-02-23 DIAGNOSIS — J8489 Other specified interstitial pulmonary diseases: Secondary | ICD-10-CM | POA: Insufficient documentation

## 2018-02-23 NOTE — Progress Notes (Signed)
Subjective:    Patient ID: Carol Johnson, female    DOB: [birth year], 52 y.o.   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 She has undergone pulmonary evaluation for a groundglass infiltrates, diagnosed with nonspecific interstitial pneumonitis in the setting of positive ANA 1 :640 and has been referred to rheumatologist.  No clear diagnosis has been established for interstitial lung disease.  She reports excessive daytime somnolence and unrefreshing sleep.  Loud snoring has been noted by her 39 year old son from the next room.  She reports occasional gasping episodes that wake her up from sleep. Epworth sleepiness score is 15 and she reports sleepiness while watching TV, sitting inactive in a public space, lying down to rest in the afternoons and sitting quietly after lunch.  In the same time she does also report that she has trouble falling asleep and frequent nocturnal awakenings and she sleeps poorly.  She often finds that she is sleepy in the afternoons even during meetings with her boss present.  Bedtime is between midnight and 1 AM, the TV stays on through the night, she is often falling asleep on the couch after dinner where she sleeps for about 3 hours before she gets into the bedroom.  She sleeps on his side with one pillow and often reports numbness of arms from the side on which she is vomiting.  She reports several nocturnal awakenings including nocturia and is out of bed by 7 AM feeling tired with extreme dryness of mouth and body aches and occasional headache. She has gained 60 pounds over the last 2 years  There is no history suggestive of cataplexy, sleep paralysis or parasomnias  She reports depressive symptoms and sadness of mood after a recent break-up with her partner 5 years.  She has been diagnosed with lymphocytic colitis by her gastroenterologist and is on treatment for this.  She reports joint pains  in her elbows knees and wrist for many months.  She denies skin rash or alopecia   Past Medical History:  Diagnosis Date  . Abdominal pain, unspecified site   . Allergic rhinitis   . Anxiety   . Asthma   . Depression   . DVT (deep venous thrombosis) ([place]) 2016  . Fibroid uterus   . GERD (gastroesophageal reflux disease)   . Hypercholesterolemia   . Hypertension   . Internal hemorrhoid   . Irritable bowel syndrome   . Lymphocytic colitis 01/02/2015  . Pneumonia 1998  . Ulcerative colitis ([place])   . URI (upper respiratory infection)   . Uterine fibroid   . Vitamin B12 deficiency     Past Surgical History:  Procedure Laterality Date  . CESAREAN SECTION  2004  . CHOLECYSTECTOMY     laparoscopic "gallstones"  . ESOPHAGOGASTRODUODENOSCOPY (EGD) WITH PROPOFOL N/A 05/03/2014   Procedure: ESOPHAGOGASTRODUODENOSCOPY (EGD) WITH PROPOFOL;  Surgeon: Inda Castle, MD;  Location: [facility];  Service: Endoscopy;  Laterality: N/A;  . MYOMECTOMY    . UTERINE FIBROID SURGERY      Allergies  Allergen Reactions  . Sertraline Hcl     REACTION: diarrhea  . Zoloft [Sertraline Hcl] Diarrhea    Social History   Socioeconomic History  . Marital status: Married    Spouse name: Not on file  . Number of children: 1  . Years of education: Not on file  . Highest education level: Not on file  Occupational History  . Occupation: Admin. assistant  Social  Needs  . Financial resource strain: Not on file  . Food insecurity:    Worry: Not on file    Inability: Not on file  . Transportation needs:    Medical: Not on file    Non-medical: Not on file  Tobacco Use  . Smoking status: Former Smoker    Packs/day: 0.10    Years: 10.00    Pack years: 1.00    Types: Cigarettes    Last attempt to quit: 12/07/2017    Years since quitting: 0.2  . Smokeless tobacco: Never Used  Substance and Sexual Activity  . Alcohol use: Yes    Alcohol/week: 3.0 oz    Types: 5 Cans of beer per week  . Drug  use: No  . Sexual activity: Yes    Birth control/protection: Pill  Lifestyle  . Physical activity:    Days per week: Not on file    Minutes per session: Not on file  . Stress: Not on file  Relationships  . Social connections:    Talks on phone: Not on file    Gets together: Not on file    Attends religious service: Not on file    Active member of club or organization: Not on file    Attends meetings of clubs or organizations: Not on file    Relationship status: Not on file  . Intimate partner violence:    Fear of current or ex partner: Not on file    Emotionally abused: Not on file    Physically abused: Not on file    Forced sexual activity: Not on file  Other Topics Concern  . Not on file  Social History Narrative  . Not on file     Family History  Problem Relation Age of Onset  . Pulmonary fibrosis Father   . Colon cancer Paternal Aunt   . Uterine cancer Paternal Grandmother        with mets to the colon   . Heart failure Maternal Grandmother   . Heart disease Paternal Uncle   . Heart disease Paternal Aunt   . Kidney disease Cousin   . Hyperlipidemia Mother   . Mitral valve prolapse Mother   . Heart disease Mother        arrhythmia      Review of Systems  Positive for shortness of mood, joint stiffness, anxiety, weight gain, indigestion, shortness of breath with activity   Constitutional: negative for anorexia, fevers and sweats  Eyes: negative for irritation, redness and visual disturbance  Ears, nose, mouth, throat, and face: negative for earaches, epistaxis, nasal congestion and sore throat  Respiratory: negative for cough,  sputum and wheezing  Cardiovascular: negative for chest pain, dyspnea, lower extremity edema, orthopnea, palpitations and syncope  Gastrointestinal: negative for abdominal pain,  melena, nausea and vomiting  Genitourinary:negative for dysuria, frequency and hematuria  Hematologic/lymphatic: negative for bleeding, easy bruising and  lymphadenopathy  Musculoskeletal:negative for arthralgias, muscle weakness  Neurological: negative for coordination problems, gait problems, headaches and weakness  Endocrine: negative for diabetic symptoms including polydipsia, polyuria and weight loss     Objective:   Physical Exam  Gen. Pleasant, obese, in no distress, normal affect ENT - class 2 airway, overbite, no post nasal drip Neck: No JVD, no thyromegaly, no carotid bruits Lungs: no use of accessory muscles, no dullness to percussion, decreased without rales or rhonchi  Cardiovascular: Rhythm regular, heart sounds  normal, no murmurs or gallops, no peripheral edema Abdomen: soft and non-tender, no hepatosplenomegaly,  BS normal. Musculoskeletal: No deformities, no cyanosis or clubbing Neuro:  alert, non focal, no tremors       Assessment & Plan:

## 2018-02-23 NOTE — Patient Instructions (Signed)
Schedule home sleep study.

## 2018-02-23 NOTE — Assessment & Plan Note (Signed)
Positive ANA to be evaluated by rheumatology. She does have nonspecific joint pains and this could be undiagnosed collagen vascular disease Wonder if this could be related to inflammatory bowel disease.  Doubt related to mesalamine Regardless, good pulmonary function in this point and reasonable to observe

## 2018-02-23 NOTE — Assessment & Plan Note (Signed)
Given excessive daytime somnolence, narrow pharyngeal exam, witnessed apneas & loud snoring, obstructive sleep apnea is very likely & an overnight polysomnogram will be scheduled as a home study. The pathophysiology of obstructive sleep apnea , it's cardiovascular consequences & modes of treatment including CPAP were discused with the patient in detail & they evidenced understanding.  Pretest probability is high.  The concern here is due to frequent nocturnal awakenings and fretful sleep, we may not be able to estimate total sleep time correctly with a home study.  She does seem to be a mouth breather

## 2018-02-25 LAB — PULMONARY FUNCTION TEST
DL/VA % PRED: 111 %
DL/VA: 5.11 ml/min/mmHg/L
DLCO COR % PRED: 92 %
DLCO UNC % PRED: 87 %
DLCO cor: 20.22 ml/min/mmHg
DLCO unc: 19.22 ml/min/mmHg
FEF 25-75 POST: 2.49 L/s
FEF 25-75 PRE: 2.3 L/s
FEF2575-%CHANGE-POST: 8 %
FEF2575-%PRED-POST: 98 %
FEF2575-%Pred-Pre: 91 %
FEV1-%Change-Post: 1 %
FEV1-%Pred-Post: 85 %
FEV1-%Pred-Pre: 84 %
FEV1-POST: 2.19 L
FEV1-Pre: 2.16 L
FEV1FVC-%CHANGE-POST: 2 %
FEV1FVC-%PRED-PRE: 105 %
FEV6-%CHANGE-POST: 0 %
FEV6-%PRED-POST: 81 %
FEV6-%Pred-Pre: 81 %
FEV6-Post: 2.57 L
FEV6-Pre: 2.58 L
FEV6FVC-%Pred-Post: 103 %
FEV6FVC-%Pred-Pre: 103 %
FVC-%CHANGE-POST: 0 %
FVC-%Pred-Post: 78 %
FVC-%Pred-Pre: 79 %
FVC-Post: 2.57 L
POST FEV1/FVC RATIO: 85 %
Post FEV6/FVC ratio: 100 %
Pre FEV1/FVC ratio: 84 %
Pre FEV6/FVC Ratio: 100 %
RV % pred: 92 %
RV: 1.64 L
TLC % pred: 90 %
TLC: 4.32 L

## 2018-03-07 ENCOUNTER — Encounter: Payer: Self-pay | Admitting: Pulmonary Disease

## 2018-03-07 ENCOUNTER — Telehealth: Payer: Self-pay | Admitting: *Deleted

## 2018-03-07 ENCOUNTER — Encounter: Payer: Self-pay | Admitting: Internal Medicine

## 2018-03-07 ENCOUNTER — Encounter: Payer: Self-pay | Admitting: Family Medicine

## 2018-03-07 MED ORDER — LOSARTAN POTASSIUM 25 MG PO TABS: 25.0000 mg | ORAL_TABLET | Freq: Every day | ORAL | 3 refills | Status: DC

## 2018-03-07 NOTE — Telephone Encounter (Signed)
Thank you for the update.  We should follow-up in the office after completion of the CTA.  Please let me know if any scheduling issues arise or if the patient has other questions or concerns.  Nelva Bush, MD [facility] Pager: [phone]

## 2018-03-07 NOTE — Telephone Encounter (Signed)
Called patient in response to her MyChart message:  [name], [name]  to End, Harrell Gave, MD      03/07/18 3:21 PM  I thought we were trying to get a CT scan with dye done before my next visit with you and I have not had anyone follow up with me on that... shouldn't that be scheduled soon so that you have a result to consult about when I come see you next.   Also, you prescribed me some sort of blood pressure medicine that you said would make me sluggish. The prescription was 30 days worth or more so I didn't pick it up because my understanding was that we were only going to have me take that a few days before the dye test. I need to know exactly when that is and how many days I would have to take the 'sluggish' blood pressure meds before the test (3, 5, 10? how many).   Thanks.  Carol Johnson

## 2018-03-07 NOTE — Telephone Encounter (Signed)
Spoke with patient after reviewing chart. Advised patient to pick up metoprolol succinate and start this medication 2 days prior to coronary CTA Advised at that time she is to decrease losartan to 1/2 tablet (25 mg) daily. After speaking with [facility] advised patient she should receive call tomorrow re: scheduling.  Advised patient to continue the above medication regimen until her appointment on 03/20/18 with Dr. Saunders Revel unless she hears otherwise.   Pt verbalizes understanding and agreement with this plan.  Changed losartan prescription from 50 mg to 25 mg daily.

## 2018-03-12 MED ORDER — MELOXICAM 15 MG PO TABS: 15.0000 mg | ORAL_TABLET | Freq: Every day | ORAL | 3 refills | Status: DC | PRN

## 2018-03-12 NOTE — Telephone Encounter (Signed)
meloxicam

## 2018-03-15 ENCOUNTER — Telehealth: Payer: Self-pay

## 2018-03-15 ENCOUNTER — Encounter: Payer: Self-pay | Admitting: Internal Medicine

## 2018-03-15 NOTE — Telephone Encounter (Signed)
Spoke to patient and gave her detailed instructions for Cardiac CT scheduled tomorrow afternoon.  She verbalized understanding.

## 2018-03-16 ENCOUNTER — Other Ambulatory Visit: Payer: Self-pay | Admitting: *Deleted

## 2018-03-16 ENCOUNTER — Ambulatory Visit (HOSPITAL_COMMUNITY): Payer: BLUE CROSS/BLUE SHIELD

## 2018-03-16 ENCOUNTER — Encounter: Payer: Self-pay | Admitting: Internal Medicine

## 2018-03-16 ENCOUNTER — Ambulatory Visit (HOSPITAL_COMMUNITY)
Admission: RE | Admit: 2018-03-16 | Discharge: 2018-03-16 | Disposition: A | Discharge status: Home / Self Care | Payer: BLUE CROSS/BLUE SHIELD | Source: Ambulatory Visit | Attending: Internal Medicine | Admitting: Internal Medicine

## 2018-03-16 DIAGNOSIS — R0609 Other forms of dyspnea: Secondary | ICD-10-CM | POA: Insufficient documentation

## 2018-03-16 DIAGNOSIS — I2584 Coronary atherosclerosis due to calcified coronary lesion: Secondary | ICD-10-CM | POA: Diagnosis not present

## 2018-03-16 DIAGNOSIS — R079 Chest pain, unspecified: Secondary | ICD-10-CM | POA: Diagnosis not present

## 2018-03-16 DIAGNOSIS — I2510 Atherosclerotic heart disease of native coronary artery without angina pectoris: Secondary | ICD-10-CM | POA: Diagnosis not present

## 2018-03-16 MED ORDER — NITROGLYCERIN 0.4 MG SL SUBL
SUBLINGUAL_TABLET | SUBLINGUAL | Status: AC
  Filled 2018-03-16: qty 2

## 2018-03-16 MED ORDER — METOPROLOL TARTRATE 5 MG/5ML IV SOLN
INTRAVENOUS | Status: AC
  Filled 2018-03-16: qty 5

## 2018-03-16 MED ORDER — BUPROPION HCL ER (XL) 300 MG PO TB24: 300.0000 mg | ORAL_TABLET | Freq: Every day | ORAL | 11 refills | Status: DC

## 2018-03-16 MED ORDER — IOPAMIDOL (ISOVUE-370) INJECTION 76%
INTRAVENOUS | Status: AC
  Filled 2018-03-16: qty 100

## 2018-03-16 MED ORDER — NITROGLYCERIN 0.4 MG SL SUBL: 0.8000 mg | SUBLINGUAL_TABLET | SUBLINGUAL | Status: DC | PRN

## 2018-03-16 MED ORDER — IOPAMIDOL (ISOVUE-370) INJECTION 76%
100.0000 mL | Freq: Once | INTRAVENOUS | Status: AC | PRN
  Administered 2018-03-16: 100 mL via INTRAVENOUS

## 2018-03-16 MED ORDER — BUPROPION HCL ER (XL) 300 MG PO TB24: 300.0000 mg | ORAL_TABLET | Freq: Every day | ORAL | 2 refills | Status: DC

## 2018-03-16 MED ORDER — METOPROLOL TARTRATE 5 MG/5ML IV SOLN: 5.0000 mg | INTRAVENOUS | Status: DC | PRN

## 2018-03-16 MED ORDER — METOPROLOL TARTRATE 5 MG/5ML IV SOLN
INTRAVENOUS | Status: AC
  Filled 2018-03-16: qty 10

## 2018-03-16 NOTE — Telephone Encounter (Signed)
D/w patient by phone.  No driver needed, as there will be no sedation or anesthesia for the cardiac CTA.  Nelva Bush, MD [facility] Pager: [phone]

## 2018-03-19 DIAGNOSIS — G4733 Obstructive sleep apnea (adult) (pediatric): Secondary | ICD-10-CM | POA: Diagnosis not present

## 2018-03-20 ENCOUNTER — Ambulatory Visit: Payer: BLUE CROSS/BLUE SHIELD | Admitting: Internal Medicine

## 2018-03-20 ENCOUNTER — Encounter: Payer: Self-pay | Admitting: Internal Medicine

## 2018-03-20 ENCOUNTER — Other Ambulatory Visit: Payer: Self-pay | Admitting: *Deleted

## 2018-03-20 ENCOUNTER — Telehealth: Payer: Self-pay | Admitting: Pulmonary Disease

## 2018-03-20 VITALS — BP 120/80 | HR 89 | Ht 63.0 in | Wt 246.0 lb

## 2018-03-20 DIAGNOSIS — G4733 Obstructive sleep apnea (adult) (pediatric): Secondary | ICD-10-CM | POA: Diagnosis not present

## 2018-03-20 DIAGNOSIS — I2510 Atherosclerotic heart disease of native coronary artery without angina pectoris: Secondary | ICD-10-CM | POA: Diagnosis not present

## 2018-03-20 DIAGNOSIS — R0609 Other forms of dyspnea: Secondary | ICD-10-CM

## 2018-03-20 NOTE — Progress Notes (Signed)
Follow-up Outpatient Visit Date: 03/20/2018  Primary Care Provider: Abner Greenspan, MD [place] [address]  Chief Complaint: Shortness of breath  HPI:  Carol Johnson is a 52 y.o. year-old female with history of hypertension, hyperlipidemia, DVT, asthma, IBS, lymphocytic colitis, ulcerative colitis, and asthma, who presents for follow-up of shortness of breath.  I met her last month, at which time she complained of progressive shortness of breath over the preceding 6 months accompanied by diaphoresis and general unwell feeling.  Echocardiogram in May showed no significant abnormalities.  However, coronary artery calcification was incidentally noted during pulmonology work-up.  We agreed to perform a cardiac CTA, which was somewhat limited by motion artifact.  No significant stenosis was identified, though coronary artery calcification was again seen.  Today, Carol Johnson reports feeling about the same as at our prior visit.  She still has dyspnea with modest activity.  She has been limiting her activity out of fear that she may have a heart problem.  She has not had any chest pain or palpitations.  She notes occasional swelling in her arm as well as tingling and numbness in both arms from the elbows through her hands.  She also has frequent joint pains in her upper and lower extremities.  She is scheduled to be evaluated by a rheumatologist next month.  She also points out that ANA was recently found to be quite elevated.  Carol Johnson continues on atorvastatin 10 mg daily.  Upon further questioning, it seems that some of her aches and pains may have worsened a bit since this medication was started in December.  She is currently taking losartan 12.5 mg daily as well as metoprolol succinate 25 mg daily.  She is tolerating the medications well.  --------------------------------------------------------------------------------------------------  Cardiovascular History &  Procedures: Cardiovascular Problems:  Coronary artery calcification  Shortness of breath  Risk Factors:  Hypertension, hyperlipidemia, morbid obesity, and tobacco abuse  Cath/PCI:  None  CV Surgery:  None  EP Procedures and Devices:  None  Non-Invasive Evaluation(s):  Cardiac CTA (03/16/2018): Degraded by motion artifact.  LMCA with small area of calcified plaque but no stenosis.  LAD with calcified plaque in the mid vessel and mild stenosis.  LCx with a large ramus.  No significant plaque or stenosis in the LCx or AV groove LV CX.  RCA without plaque or stenosis.  Coronary calcium score 45 Agatson units (93rd percentile for age/gender).  No significant extracardiac findings.  TTE (01/05/2018): Normal LV size and wall thickness.  LVEF 65 to 70%.  Normal RV size and function.   Recent CV Pertinent Labs: Lab Results  Component Value Date   CHOL 240 (H) 08/04/2017   HDL 35.60 (L) 08/04/2017   LDLCALC 155 (H) 07/18/2015   LDLDIRECT 193.0 08/04/2017   TRIG 218.0 (H) 08/04/2017   CHOLHDL 7 08/04/2017   INR 1.2 (H) 01/02/2015   K 4.2 02/09/2018   BUN 9 02/09/2018   CREATININE 0.76 02/09/2018    Past medical and surgical history were reviewed and updated in EPIC.  Current Meds  Medication Sig  . aspirin EC 81 MG tablet Take 1 tablet (81 mg total) by mouth daily.  Marland Kitchen atorvastatin (LIPITOR) 10 MG tablet Take 1 tablet (10 mg total) daily by mouth.  Marland Kitchen buPROPion (WELLBUTRIN XL) 300 MG 24 hr tablet Take 1 tablet (300 mg total) by mouth daily.  . citalopram (CELEXA) 20 MG tablet Take 1 tablet (20 mg total) by mouth daily.  Marland Kitchen  fexofenadine (ALLEGRA) 180 MG tablet Take 180 mg by mouth daily.  . fluticasone (FLONASE) 50 MCG/ACT nasal spray Place 2 sprays into both nostrils daily. (Patient taking differently: Place 2 sprays daily as needed into both nostrils. )  . hydrochlorothiazide (MICROZIDE) 12.5 MG capsule Take 1 capsule (12.5 mg total) by mouth daily.  Marland Kitchen losartan (COZAAR)  25 MG tablet Take 1 tablet (25 mg total) by mouth daily.  . mesalamine (LIALDA) 1.2 g EC tablet TAKE 2 TABLETS BY MOUTH TWICE A DAY  . metoprolol succinate (TOPROL-XL) 25 MG 24 hr tablet Take 1 tablet (25 mg total) by mouth daily. Take with or immediately following a meal.  . zolpidem (AMBIEN CR) 12.5 MG CR tablet Take 1 tablet (12.5 mg total) by mouth at bedtime as needed. for sleep    Allergies: Sertraline hcl and Zoloft [sertraline hcl]  Social History   Tobacco Use  . Smoking status: Former Smoker    Packs/day: 0.10    Years: 10.00    Pack years: 1.00    Types: Cigarettes    Last attempt to quit: 12/07/2017    Years since quitting: 0.2  . Smokeless tobacco: Never Used  Substance Use Topics  . Alcohol use: Yes    Alcohol/week: 3.0 oz    Types: 5 Cans of beer per week  . Drug use: No    Family History  Problem Relation Age of Onset  . Pulmonary fibrosis Father   . Colon cancer Paternal Aunt   . Uterine cancer Paternal Grandmother        with mets to the colon   . Heart failure Maternal Grandmother   . Heart disease Paternal Uncle   . Heart disease Paternal Aunt   . Kidney disease Cousin   . Hyperlipidemia Mother   . Mitral valve prolapse Mother   . Heart disease Mother        arrhythmia    Review of Systems: A 12-system review of systems was performed and was negative except as noted in the HPI.  --------------------------------------------------------------------------------------------------  Physical Exam: BP 120/80 (BP Location: Left Arm, Patient Position: Sitting, Cuff Size: Large)   Pulse 89   Ht 5' 3"  (1.6 m)   Wt 246 lb (111.6 kg)   LMP 11/29/2015   BMI 43.58 kg/m   General: Morbidly obese woman, seated comfortably in the exam room. HEENT: No conjunctival pallor or scleral icterus. Moist mucous membranes.  OP clear. Neck: Supple without lymphadenopathy, thyromegaly, JVD, or HJR though evaluation is limited by body habitus. Lungs: Normal work of  breathing. Clear to auscultation bilaterally without wheezes or crackles. Heart: Regular rate and rhythm without murmurs, rubs, or gallops.  Unable to assess PMI due to body habitus. Abd: Bowel sounds present. Soft, NT/ND.  Unable to assess HSM due to body habitus. Ext: No lower extremity edema. Radial, PT, and DP pulses are 2+ bilaterally. Skin: Warm and dry without rash.  Lab Results  Component Value Date   WBC 14.1 (H) 12/21/2017   HGB 11.9 (L) 12/21/2017   HCT 36.5 12/21/2017   MCV 80.0 12/21/2017   PLT 406.0 (H) 12/21/2017    Lab Results  Component Value Date   NA 139 02/09/2018   K 4.2 02/09/2018   CL 102 02/09/2018   CO2 23 02/09/2018   BUN 9 02/09/2018   CREATININE 0.76 02/09/2018   GLUCOSE 93 02/09/2018   ALT 14 12/21/2017    Lab Results  Component Value Date   CHOL 240 (H) 08/04/2017  HDL 35.60 (L) 08/04/2017   LDLCALC 155 (H) 07/18/2015   LDLDIRECT 193.0 08/04/2017   TRIG 218.0 (H) 08/04/2017   CHOLHDL 7 08/04/2017    --------------------------------------------------------------------------------------------------  ASSESSMENT AND PLAN: Dyspnea on exertion I suspect this is multifactorial.  Carol Johnson has some modest degree of diastolic dysfunction, though I believe that a large portion of her dyspnea is due to deconditioning and morbid obesity.  I have reassured her that her testing thus far has not revealed any significant cardiac pathology.  I encouraged her to begin exercising.  I think it is reasonable to continue her current medications, including the losartan 25 mg daily as prescribed (not 12.5 mg daily).  Coronary artery disease without angina Coronary artery calcification and mild stenosis involving the LAD noted.  Though the study was degraded by cardiac motion, she does not have any high-grade disease to explain her symptoms.  We discussed the importance of medical therapy and risk factor modification to prevent progression of disease.  Due to her  myalgias, we will give her a statin holiday for the next 2 to 4 weeks.  If her aches and pains improve, we would try rosuvastatin all as an alternative.  Otherwise, I will have her return to atorvastatin and likely escalate this to at least 20 mg daily for a target LDL of less than 70.  I have asked her to contact me in a month to update me on her symptoms following the statin holiday.  Morbid obesity I have encouraged Carol Johnson to lose weight through diet and exercise.  Follow-up: Return to clinic in 3 months with Dr. Harrell Gave in the [facility] office, given my transition to [place].  Nelva Bush, MD 03/21/2018 8:19 PM

## 2018-03-20 NOTE — Patient Instructions (Addendum)
Medication Instructions:   START TAKING THE WHOLE TABLET  LOSARTAN 25 MG ONCE A DAY   HOLD ATORVASTATIN FOR  2 TO 4 WEEKS  TO HELP WITH LEG CRAMPS CONTACT CLINIC TRIAGE  BACK  FOR FOLLOW UP O N THOSE SYMPTOMS.  If you need a refill on your cardiac medications before your next appointment, please call your pharmacy.  Labwork: NONE ORDERED  TODAY   Testing/Procedures: NONE ORDERED  TODAY   Follow-Up: WITH DR. Harrell Gave IN 3 MONTHS AT [facility]    Any Other Special Instructions Will Be Listed Below (If Applicable).                                                                                                                                                   \

## 2018-03-20 NOTE — Telephone Encounter (Signed)
Per RA, HST showed mild OSA with 7 events per hour. Tiredness may be due to other issues. If patient is symptomatic, can try a trial of auto cpap and sees if she benefits from it.

## 2018-03-21 ENCOUNTER — Encounter: Payer: Self-pay | Admitting: Internal Medicine

## 2018-03-21 DIAGNOSIS — I2510 Atherosclerotic heart disease of native coronary artery without angina pectoris: Secondary | ICD-10-CM | POA: Insufficient documentation

## 2018-03-22 NOTE — Telephone Encounter (Signed)
Auto CPAP 5 to 12 cm, mask of choice, office visit in 6 weeks

## 2018-03-22 NOTE — Telephone Encounter (Signed)
Spoke with patient.She is aware of results. She wishes to proceed with the cpap machine. RA, please advise on the settings for the cpap machine. Thanks!

## 2018-04-26 DIAGNOSIS — J849 Interstitial pulmonary disease, unspecified: Secondary | ICD-10-CM | POA: Diagnosis not present

## 2018-04-26 DIAGNOSIS — R768 Other specified abnormal immunological findings in serum: Secondary | ICD-10-CM | POA: Diagnosis not present

## 2018-06-20 ENCOUNTER — Ambulatory Visit: Payer: BLUE CROSS/BLUE SHIELD | Admitting: Cardiology

## 2018-07-17 ENCOUNTER — Ambulatory Visit: Payer: BLUE CROSS/BLUE SHIELD | Admitting: Internal Medicine

## 2018-07-17 NOTE — Progress Notes (Deleted)
Follow-up Outpatient Visit Date: 07/17/2018  Primary Care Provider: Abner Greenspan, MD [place] [address]  Chief Complaint: ***  HPI:  Carol Johnson is a 52 y.o. year-old female with history of hypertension, hyperlipidemia, DVT, asthma, IBS, lymphocytic colitis, ulcerative colitis, and asthma, who presents for follow-up of shortness of breath.  I last saw her in July, at which time she continue to have dyspnea with modest activity.  Rheumatology evaluation was pending in the setting of elevated ANA.  She also noted chronic myalgias, which may have worsened slightly with addition of atorvastatin.  We agreed to a statin holiday as well as to increase losartan to 25 mg daily due to suboptimal blood pressure control.  Since our last visit, she has started using CPAP under the direction of Dr. Elsworth Soho (pulmonary).  --------------------------------------------------------------------------------------------------  Cardiovascular History & Procedures: Cardiovascular Problems:  Coronary artery calcification  Shortness of breath  Risk Factors:  Hypertension, hyperlipidemia, morbid obesity, and tobacco abuse  Cath/PCI:  None  CV Surgery:  None  EP Procedures and Devices:  None  Non-Invasive Evaluation(s):  Cardiac CTA (03/16/2018): Degraded by motion artifact.  LMCA with small area of calcified plaque but no stenosis.  LAD with calcified plaque in the mid vessel and mild stenosis.  LCx with a large ramus.  No significant plaque or stenosis in the LCx or AV groove LV CX.  RCA without plaque or stenosis.  Coronary calcium score 45 Agatson units (93rd percentile for age/gender).  No significant extracardiac findings.  TTE (01/05/2018): Normal LV size and wall thickness. LVEF 65 to 70%. Normal RV size and function.  Recent CV Pertinent Labs: Lab Results  Component Value Date   CHOL 240 (H) 08/04/2017   HDL 35.60 (L) 08/04/2017   LDLCALC 155 (H) 07/18/2015    LDLDIRECT 193.0 08/04/2017   TRIG 218.0 (H) 08/04/2017   CHOLHDL 7 08/04/2017   INR 1.2 (H) 01/02/2015   K 4.2 02/09/2018   BUN 9 02/09/2018   CREATININE 0.76 02/09/2018    Past medical and surgical history were reviewed and updated in EPIC.  No outpatient medications have been marked as taking for the 07/17/18 encounter (Appointment) with Marianita Botkin, Harrell Gave, MD.    Allergies: Sertraline hcl and Zoloft [sertraline hcl]  Social History   Tobacco Use  . Smoking status: Former Smoker    Packs/day: 0.10    Years: 10.00    Pack years: 1.00    Types: Cigarettes    Last attempt to quit: 12/07/2017    Years since quitting: 0.6  . Smokeless tobacco: Never Used  Substance Use Topics  . Alcohol use: Yes    Alcohol/week: 5.0 standard drinks    Types: 5 Cans of beer per week  . Drug use: No    Family History  Problem Relation Age of Onset  . Pulmonary fibrosis Father   . Colon cancer Paternal Aunt   . Uterine cancer Paternal Grandmother        with mets to the colon   . Heart failure Maternal Grandmother   . Heart disease Paternal Uncle   . Heart disease Paternal Aunt   . Kidney disease Cousin   . Hyperlipidemia Mother   . Mitral valve prolapse Mother   . Heart disease Mother        arrhythmia    Review of Systems: A 12-system review of systems was performed and was negative except as noted in the HPI.  --------------------------------------------------------------------------------------------------  Physical Exam: LMP 11/29/2015  General:  *** HEENT: No conjunctival pallor or scleral icterus. Moist mucous membranes.  OP clear. Neck: Supple without lymphadenopathy, thyromegaly, JVD, or HJR. No carotid bruit. Lungs: Normal work of breathing. Clear to auscultation bilaterally without wheezes or crackles. Heart: Regular rate and rhythm without murmurs, rubs, or gallops. Non-displaced PMI. Abd: Bowel sounds present. Soft, NT/ND without hepatosplenomegaly Ext: No lower  extremity edema. Radial, PT, and DP pulses are 2+ bilaterally. Skin: Warm and dry without rash.  EKG:  ***  Lab Results  Component Value Date   WBC 14.1 (H) 12/21/2017   HGB 11.9 (L) 12/21/2017   HCT 36.5 12/21/2017   MCV 80.0 12/21/2017   PLT 406.0 (H) 12/21/2017    Lab Results  Component Value Date   NA 139 02/09/2018   K 4.2 02/09/2018   CL 102 02/09/2018   CO2 23 02/09/2018   BUN 9 02/09/2018   CREATININE 0.76 02/09/2018   GLUCOSE 93 02/09/2018   ALT 14 12/21/2017    Lab Results  Component Value Date   CHOL 240 (H) 08/04/2017   HDL 35.60 (L) 08/04/2017   LDLCALC 155 (H) 07/18/2015   LDLDIRECT 193.0 08/04/2017   TRIG 218.0 (H) 08/04/2017   CHOLHDL 7 08/04/2017    --------------------------------------------------------------------------------------------------  ASSESSMENT AND PLAN: Nelva Bush, MD 07/17/2018 7:43 AM

## 2018-07-18 ENCOUNTER — Encounter: Payer: Self-pay | Admitting: Internal Medicine

## 2018-07-22 DIAGNOSIS — J029 Acute pharyngitis, unspecified: Secondary | ICD-10-CM | POA: Diagnosis not present

## 2018-07-31 ENCOUNTER — Ambulatory Visit (INDEPENDENT_AMBULATORY_CARE_PROVIDER_SITE_OTHER)
Admission: RE | Admit: 2018-07-31 | Discharge: 2018-07-31 | Disposition: A | Discharge status: Home / Self Care | Payer: BLUE CROSS/BLUE SHIELD | Source: Ambulatory Visit | Attending: Internal Medicine | Admitting: Internal Medicine

## 2018-07-31 DIAGNOSIS — R918 Other nonspecific abnormal finding of lung field: Secondary | ICD-10-CM | POA: Diagnosis not present

## 2018-07-31 DIAGNOSIS — Z836 Family history of other diseases of the respiratory system: Secondary | ICD-10-CM

## 2018-07-31 DIAGNOSIS — Z87891 Personal history of nicotine dependence: Secondary | ICD-10-CM

## 2018-07-31 DIAGNOSIS — J84112 Idiopathic pulmonary fibrosis: Secondary | ICD-10-CM | POA: Diagnosis not present

## 2018-07-31 DIAGNOSIS — J439 Emphysema, unspecified: Secondary | ICD-10-CM | POA: Diagnosis not present

## 2018-08-02 ENCOUNTER — Encounter: Payer: Self-pay | Admitting: Family Medicine

## 2018-08-02 ENCOUNTER — Ambulatory Visit: Payer: BLUE CROSS/BLUE SHIELD | Admitting: Family Medicine

## 2018-08-02 VITALS — BP 128/76 | HR 103 | Temp 97.8°F | Ht 63.0 in | Wt 222.4 lb

## 2018-08-02 DIAGNOSIS — J0100 Acute maxillary sinusitis, unspecified: Secondary | ICD-10-CM

## 2018-08-02 DIAGNOSIS — R05 Cough: Secondary | ICD-10-CM

## 2018-08-02 DIAGNOSIS — R059 Cough, unspecified: Secondary | ICD-10-CM

## 2018-08-02 DIAGNOSIS — H66001 Acute suppurative otitis media without spontaneous rupture of ear drum, right ear: Secondary | ICD-10-CM | POA: Diagnosis not present

## 2018-08-02 MED ORDER — ALBUTEROL SULFATE HFA 108 (90 BASE) MCG/ACT IN AERS: 2.0000 | INHALATION_SPRAY | RESPIRATORY_TRACT | 1 refills | Status: DC | PRN

## 2018-08-02 MED ORDER — AMOXICILLIN-POT CLAVULANATE 875-125 MG PO TABS: 1.0000 | ORAL_TABLET | Freq: Two times a day (BID) | ORAL | 0 refills | Status: DC

## 2018-08-02 NOTE — Progress Notes (Signed)
Subjective:    Patient ID: Carol Johnson, female    DOB: 1966-08-14, 52 y.o.   MRN: [id number]  HPI This is a 52 yo female who presents with 2 weeks nasal congestion, started with sore throat, difficulty swallowing. Was seen at [facility] and diagnosed with viral uri. Rapid strep negative, culture with non group a beta hemolytic colonies. Cough worse at night. Headache over and under eyes. Ear pressure, subjective fever. Hearing some wheezing, no SOB. Does not have an inhaler at home. Taking Dayquil with little improvement.   Doing Weight Watchers, has lost 24 pounds.   Past Medical History:  Diagnosis Date  . Abdominal pain, unspecified site   . Allergic rhinitis   . Anxiety   . Asthma   . Depression   . DVT (deep venous thrombosis) ([place]) 2016  . Fibroid uterus   . GERD (gastroesophageal reflux disease)   . Hypercholesterolemia   . Hypertension   . Internal hemorrhoid   . Irritable bowel syndrome   . Lymphocytic colitis 01/02/2015  . Pneumonia 1998  . Ulcerative colitis ([place])   . URI (upper respiratory infection)   . Uterine fibroid   . Vitamin B12 deficiency    Past Surgical History:  Procedure Laterality Date  . CESAREAN SECTION  2004  . CHOLECYSTECTOMY     laparoscopic "gallstones"  . ESOPHAGOGASTRODUODENOSCOPY (EGD) WITH PROPOFOL N/A 05/03/2014   Procedure: ESOPHAGOGASTRODUODENOSCOPY (EGD) WITH PROPOFOL;  Surgeon: Inda Castle, MD;  Location: [facility];  Service: Endoscopy;  Laterality: N/A;  . MYOMECTOMY    . UTERINE FIBROID SURGERY     Family History  Problem Relation Age of Onset  . Pulmonary fibrosis Father   . Colon cancer Paternal Aunt   . Uterine cancer Paternal Grandmother        with mets to the colon   . Heart failure Maternal Grandmother   . Heart disease Paternal Uncle   . Heart disease Paternal Aunt   . Kidney disease Cousin   . Hyperlipidemia Mother   . Mitral valve prolapse Mother   . Heart disease Mother        arrhythmia   Social  History   Tobacco Use  . Smoking status: Former Smoker    Packs/day: 0.10    Years: 10.00    Pack years: 1.00    Types: Cigarettes    Last attempt to quit: 12/07/2017    Years since quitting: 0.6  . Smokeless tobacco: Never Used  Substance Use Topics  . Alcohol use: Yes    Alcohol/week: 5.0 standard drinks    Types: 5 Cans of beer per week  . Drug use: No      Review of Systems Per HPI    Objective:   Physical Exam  Constitutional: She appears well-developed and well-nourished. No distress.  HENT:  Head: Normocephalic and atraumatic.  Right Ear: External ear and ear canal normal. Tympanic membrane is erythematous and bulging.  Left Ear: Tympanic membrane, external ear and ear canal normal.  Nose: Mucosal edema present. Right sinus exhibits maxillary sinus tenderness. Right sinus exhibits no frontal sinus tenderness. Left sinus exhibits maxillary sinus tenderness. Left sinus exhibits no frontal sinus tenderness.  Mouth/Throat: Uvula is midline and mucous membranes are normal. Posterior oropharyngeal erythema present. No oropharyngeal exudate, posterior oropharyngeal edema or tonsillar abscesses.  Eyes: Conjunctivae are normal.  Neck: Normal range of motion. Neck supple.  Cardiovascular: Normal rate, regular rhythm and normal heart sounds.  Pulmonary/Chest: Effort normal and breath sounds  normal.  Lymphadenopathy:    She has no cervical adenopathy.  Skin: Skin is warm and dry. She is not diaphoretic.  Psychiatric: She has a normal mood and affect. Her behavior is normal. Judgment and thought content normal.  Vitals reviewed.     BP 128/76 (BP Location: Right Arm, Patient Position: Sitting, Cuff Size: Large)   Pulse (!) 103   Temp 97.8 F (36.6 C) (Oral)   Ht 5' 3"  (1.6 m)   Wt 222 lb 6.4 oz (100.9 kg)   LMP 11/29/2015   SpO2 97%   BMI 39.40 kg/m  Wt Readings from Last 3 Encounters:  08/02/18 222 lb 6.4 oz (100.9 kg)  03/20/18 246 lb (111.6 kg)  02/23/18 241 lb  4.8 oz (109.5 kg)       Assessment & Plan:  1. Acute non-recurrent maxillary sinusitis - amoxicillin-clavulanate (AUGMENTIN) 875-125 MG tablet; Take 1 tablet by mouth 2 (two) times daily.  Dispense: 20 tablet; Refill: 0  2. Non-recurrent acute suppurative otitis media of right ear without spontaneous rupture of tympanic membrane - amoxicillin-clavulanate (AUGMENTIN) 875-125 MG tablet; Take 1 tablet by mouth 2 (two) times daily.  Dispense: 20 tablet; Refill: 0  3. Cough - albuterol (PROVENTIL HFA;VENTOLIN HFA) 108 (90 Base) MCG/ACT inhaler; Inhale 2 puffs into the lungs every 4 (four) hours as needed for wheezing or shortness of breath (cough, shortness of breath or wheezing.).  Dispense: 1 Inhaler; Refill: 1  -  Patient Instructions  I am sending an albuterol inhaler to your pharmacy.  You can use every 4 6 hours as needed for wheeze, shortness of breath, chest tightness.  I have sent in an antibiotic to your pharmacy.  It is important that you complete the 10-day course, if you have problems with antibiotic please let us know.  For nasal congestion you can take over-the-counter pseudoephedrine and use Afrin type nasal spray twice a day for up to 4 days.  Continue ibuprofen as needed for fever and can alternate with acetaminophen.  If not better in 5 to 7 days or if worse please let us know.   Clarene Reamer, FNP-BC  [facility]  08/02/2018 2:49 PM

## 2018-08-02 NOTE — Patient Instructions (Signed)
I am sending an albuterol inhaler to your pharmacy.  You can use every 4 6 hours as needed for wheeze, shortness of breath, chest tightness.  I have sent in an antibiotic to your pharmacy.  It is important that you complete the 10-day course, if you have problems with antibiotic please let us know.  For nasal congestion you can take over-the-counter pseudoephedrine and use Afrin type nasal spray twice a day for up to 4 days.  Continue ibuprofen as needed for fever and can alternate with acetaminophen.  If not better in 5 to 7 days or if worse please let us know.

## 2018-08-17 NOTE — Telephone Encounter (Signed)
1. Sorry for delay 2. Good news - pulmonary inflammation resolved 3. Radiologists saying there is likely mild emphysema. This does not make sense because she does not have adwequate smoking hx.  4. Plan do spirometry and dlco and return for followup     reports that she quit smoking about 8 months ago. Her smoking use included cigarettes. She has a 1.00 pack-year smoking history. She has never used smokeless tobacco.     CLINICAL DATA:  Idiopathic pulmonary fibrosis, former smoker, abnormal chest radiograph.  EXAM: CT CHEST WITHOUT CONTRAST  TECHNIQUE: Multidetector CT imaging of the chest was performed following the standard protocol without intravenous contrast. High resolution imaging of the lungs, as well as inspiratory and expiratory imaging, was performed.  COMPARISON:  01/16/2018 and 03/03/2004.  FINDINGS: Cardiovascular: Coronary artery calcification. Heart is at the upper limits of normal in size. No pericardial effusion.  Mediastinum/Nodes: Mediastinal and axillary lymph nodes are not enlarged by CT size criteria. Hilar regions are difficult to evaluate without IV contrast. Esophagus is grossly unremarkable.  Lungs/Pleura: Mild centrilobular emphysema. Near complete resolution of previously seen patchy subpleural and basilar predominant subpleural reticulation and ground-glass. Minimal residual subpleural ground-glass and reticulation in the lateral right middle lobe (series 3, image 92). No traction bronchiectasis/bronchiolectasis, architectural distortion or honeycombing. No pleural fluid. Airway is unremarkable.  Upper Abdomen: Visualized portions of the liver, adrenal glands, kidneys, spleen, pancreas, stomach and bowel are grossly unremarkable. Cholecystectomy. No upper abdominal adenopathy.  Musculoskeletal: No worrisome lytic or sclerotic lesions. Degenerative changes in the spine.  IMPRESSION: 1. Near complete resolution of basilar  subpleural reticulation and ground-glass, arguing against interstitial lung disease. 2.  Emphysema (ICD10-J43.9).   Electronically Signed   By: Lorin Picket M.D.   On: 07/31/2018 09:27

## 2018-08-17 NOTE — Telephone Encounter (Signed)
Pt is requesting CT results from 07/31/18.  MR please advise.

## 2018-08-30 DIAGNOSIS — I639 Cerebral infarction, unspecified: Secondary | ICD-10-CM

## 2018-08-30 HISTORY — DX: Cerebral infarction, unspecified: I63.9

## 2018-09-06 DIAGNOSIS — M2550 Pain in unspecified joint: Secondary | ICD-10-CM | POA: Diagnosis not present

## 2018-09-06 DIAGNOSIS — J849 Interstitial pulmonary disease, unspecified: Secondary | ICD-10-CM | POA: Diagnosis not present

## 2018-09-06 DIAGNOSIS — R768 Other specified abnormal immunological findings in serum: Secondary | ICD-10-CM | POA: Diagnosis not present

## 2018-09-18 ENCOUNTER — Telehealth: Payer: Self-pay | Admitting: Internal Medicine

## 2018-09-18 DIAGNOSIS — J8489 Other specified interstitial pulmonary diseases: Secondary | ICD-10-CM

## 2018-09-18 NOTE — Telephone Encounter (Signed)
Attempted to call pt but unable to reach her and unable to leave a VM due to mailbox being full. Will try to call back later.

## 2018-09-18 NOTE — Telephone Encounter (Signed)
Carol Johnson  Not sure if 08/17/18 phone note about ct result was conveyed to patient or if I routed it correctly. Pleas address  Thanks  MR

## 2018-09-25 ENCOUNTER — Other Ambulatory Visit: Payer: Self-pay | Admitting: Family Medicine

## 2018-09-26 NOTE — Telephone Encounter (Signed)
Spoke with pt, she did receive message and we made an appt to do PFT and OV on 10/20/2018 at 11:00/12:00. Nothing further is needed. I sent a message on mychart as pt requested to let her know of appts.

## 2018-09-27 ENCOUNTER — Ambulatory Visit: Payer: Self-pay | Admitting: Internal Medicine

## 2018-10-11 ENCOUNTER — Other Ambulatory Visit: Payer: Self-pay | Admitting: Family Medicine

## 2018-10-11 NOTE — Telephone Encounter (Signed)
Pt had an acute appt with Tor Netters, NP on 08/02/18, last filled on 7// #30 tabs with 3 refills

## 2018-10-13 ENCOUNTER — Other Ambulatory Visit: Payer: Self-pay | Admitting: Physician Assistant

## 2018-10-14 ENCOUNTER — Other Ambulatory Visit: Payer: Self-pay | Admitting: Family Medicine

## 2018-10-16 NOTE — Telephone Encounter (Signed)
Name of Medication: Ambien CR Name of Pharmacy: [facility] [address] Henrietta Dine or Written Date and Quantity: 01/19/18 #30 tabs with 3 refills Last Office Visit and Type: Sinus issues with Tor Netters, NP on 08/02/18 Next Office Visit and Type: none scheduled  Last Controlled Substance Agreement Date: 12/01/15 Last UDS:12/01/15

## 2018-10-20 ENCOUNTER — Ambulatory Visit: Payer: Self-pay | Admitting: Internal Medicine

## 2018-11-02 ENCOUNTER — Other Ambulatory Visit: Payer: Self-pay | Admitting: Physician Assistant

## 2018-11-21 ENCOUNTER — Telehealth: Payer: Self-pay | Admitting: Physician Assistant

## 2018-11-21 MED ORDER — MESALAMINE 1.2 G PO TBEC: 2.4000 g | DELAYED_RELEASE_TABLET | Freq: Two times a day (BID) | ORAL | 2 refills | Status: DC

## 2018-11-21 NOTE — Telephone Encounter (Signed)
Medication sent to pharmacy  

## 2018-11-21 NOTE — Telephone Encounter (Signed)
Pt called requesting rf for [facility]alamine [facility] o [facility] [address] in [place].

## 2018-11-23 ENCOUNTER — Ambulatory Visit: Payer: Self-pay | Admitting: Internal Medicine

## 2019-01-02 ENCOUNTER — Other Ambulatory Visit: Payer: Self-pay | Admitting: Gastroenterology

## 2019-02-03 ENCOUNTER — Other Ambulatory Visit: Payer: Self-pay | Admitting: Internal Medicine

## 2019-02-09 ENCOUNTER — Encounter: Payer: Self-pay | Admitting: Family Medicine

## 2019-02-15 ENCOUNTER — Encounter: Payer: Self-pay | Admitting: Family Medicine

## 2019-02-22 ENCOUNTER — Other Ambulatory Visit: Payer: Self-pay | Admitting: Family Medicine

## 2019-03-08 ENCOUNTER — Telehealth: Payer: Self-pay | Admitting: Gastroenterology

## 2019-03-08 ENCOUNTER — Other Ambulatory Visit: Payer: Self-pay | Admitting: Gastroenterology

## 2019-03-08 NOTE — Telephone Encounter (Signed)
Spoke with patient she stated that she went to get refill of mesalamine (LIALDA) 1.2 g EC tablet [[id number]]  And the pharmacy told her that they can not fill until Monday. She stated that she has none and need to know what to do.

## 2019-03-08 NOTE — Telephone Encounter (Signed)
Attempted to call patient but mailbox is full.

## 2019-03-09 NOTE — Telephone Encounter (Signed)
Rx was sent into pharmacy.

## 2019-03-14 ENCOUNTER — Encounter: Payer: Self-pay | Admitting: Family Medicine

## 2019-03-14 ENCOUNTER — Other Ambulatory Visit: Payer: Self-pay | Admitting: *Deleted

## 2019-03-14 NOTE — Telephone Encounter (Signed)
Fax refill request, no recent or future f/u or CPE, please advise

## 2019-03-14 NOTE — Telephone Encounter (Signed)
Please schedule end of summer/early fall f/u and refill until then Thanks

## 2019-03-14 NOTE — Telephone Encounter (Signed)
Sent pt a mychart message letting her know she needs to schedule a f/u before we can refill her med

## 2019-03-15 MED ORDER — CITALOPRAM HYDROBROMIDE 20 MG PO TABS: 20.0000 mg | ORAL_TABLET | Freq: Every day | ORAL | 0 refills | Status: DC

## 2019-03-15 NOTE — Telephone Encounter (Signed)
Med refilled and pt scheduled f/u

## 2019-03-29 ENCOUNTER — Ambulatory Visit: Payer: BC Managed Care – PPO | Admitting: Family Medicine

## 2019-03-30 ENCOUNTER — Ambulatory Visit: Payer: BC Managed Care – PPO | Admitting: Family Medicine

## 2019-03-30 ENCOUNTER — Ambulatory Visit: Payer: BLUE CROSS/BLUE SHIELD | Admitting: Family Medicine

## 2019-04-06 ENCOUNTER — Ambulatory Visit: Payer: BC Managed Care – PPO | Admitting: Family Medicine

## 2019-04-06 ENCOUNTER — Other Ambulatory Visit: Payer: Self-pay

## 2019-04-06 ENCOUNTER — Encounter: Payer: Self-pay | Admitting: Family Medicine

## 2019-04-06 VITALS — BP 128/76 | HR 87 | Temp 97.7°F | Ht 63.0 in | Wt 233.3 lb

## 2019-04-06 DIAGNOSIS — E538 Deficiency of other specified B group vitamins: Secondary | ICD-10-CM | POA: Diagnosis not present

## 2019-04-06 DIAGNOSIS — I10 Essential (primary) hypertension: Secondary | ICD-10-CM | POA: Diagnosis not present

## 2019-04-06 DIAGNOSIS — E7800 Pure hypercholesterolemia, unspecified: Secondary | ICD-10-CM

## 2019-04-06 DIAGNOSIS — F5104 Psychophysiologic insomnia: Secondary | ICD-10-CM

## 2019-04-06 DIAGNOSIS — F418 Other specified anxiety disorders: Secondary | ICD-10-CM | POA: Diagnosis not present

## 2019-04-06 LAB — COMPREHENSIVE METABOLIC PANEL
ALT: 30 U/L (ref 0–35)
AST: 26 U/L (ref 0–37)
Albumin: 4.2 g/dL (ref 3.5–5.2)
Alkaline Phosphatase: 83 U/L (ref 39–117)
BUN: 12 mg/dL (ref 6–23)
CO2: 27 mEq/L (ref 19–32)
Calcium: 10 mg/dL (ref 8.4–10.5)
Chloride: 102 mEq/L (ref 96–112)
Creatinine, Ser: 0.74 mg/dL (ref 0.40–1.20)
GFR: 81.63 mL/min (ref >60.00)
Glucose, Bld: 103 mg/dL — ABNORMAL HIGH (ref 70–99)
Potassium: 4.5 mEq/L (ref 3.5–5.1)
Sodium: 137 mEq/L (ref 135–145)
Total Bilirubin: 0.2 mg/dL (ref 0.2–1.2)
Total Protein: 7.6 g/dL (ref 6.0–8.3)

## 2019-04-06 LAB — CBC WITH DIFFERENTIAL/PLATELET
Basophils Absolute: 0.1 10*3/uL (ref 0.0–0.1)
Basophils Relative: 1 % (ref 0.0–3.0)
Eosinophils Absolute: 0.6 10*3/uL (ref 0.0–0.7)
Eosinophils Relative: 5.4 % — ABNORMAL HIGH (ref 0.0–5.0)
HCT: 38.6 % (ref 36.0–46.0)
Hemoglobin: 12.1 g/dL (ref 12.0–15.0)
Lymphocytes Relative: 27.9 % (ref 12.0–46.0)
Lymphs Abs: 3.2 10*3/uL (ref 0.7–4.0)
MCHC: 31.3 g/dL (ref 30.0–36.0)
MCV: 80 fl (ref 78.0–100.0)
Monocytes Absolute: 0.7 10*3/uL (ref 0.1–1.0)
Monocytes Relative: 5.8 % (ref 3.0–12.0)
Neutro Abs: 6.8 10*3/uL (ref 1.4–7.7)
Neutrophils Relative %: 59.9 % (ref 43.0–77.0)
Platelets: 424 10*3/uL — ABNORMAL HIGH (ref 150.0–400.0)
RBC: 4.83 Mil/uL (ref 3.87–5.11)
RDW: 16.1 % — ABNORMAL HIGH (ref 11.5–15.5)
WBC: 11.4 10*3/uL — ABNORMAL HIGH (ref 4.0–10.5)

## 2019-04-06 LAB — LIPID PANEL
Cholesterol: 211 mg/dL — ABNORMAL HIGH (ref 0–200)
HDL: 42.7 mg/dL (ref >39.00)
LDL Cholesterol: 145 mg/dL — ABNORMAL HIGH (ref 0–99)
NonHDL: 168.33
Total CHOL/HDL Ratio: 5
Triglycerides: 116 mg/dL (ref 0.0–149.0)
VLDL: 23.2 mg/dL (ref 0.0–40.0)

## 2019-04-06 LAB — VITAMIN B12: Vitamin B-12: 382 pg/mL (ref 211–911)

## 2019-04-06 LAB — TSH: TSH: 2.67 u[IU]/mL (ref 0.35–4.50)

## 2019-04-06 MED ORDER — CITALOPRAM HYDROBROMIDE 20 MG PO TABS: 20.0000 mg | ORAL_TABLET | Freq: Every day | ORAL | 3 refills | Status: DC

## 2019-04-06 MED ORDER — PANTOPRAZOLE SODIUM 40 MG PO TBEC: 40.0000 mg | DELAYED_RELEASE_TABLET | Freq: Every day | ORAL | 3 refills | Status: DC

## 2019-04-06 MED ORDER — BUPROPION HCL ER (XL) 300 MG PO TB24: 300.0000 mg | ORAL_TABLET | Freq: Every day | ORAL | 3 refills | Status: DC

## 2019-04-06 MED ORDER — HYDROCHLOROTHIAZIDE 12.5 MG PO CAPS
12.5000 mg | ORAL_CAPSULE | Freq: Every day | ORAL | 3 refills | Status: DC
Start: 2019-04-06 — End: 2019-05-25

## 2019-04-06 NOTE — Patient Instructions (Addendum)
For weight loss and better health  Try to get most of your carbohydrates from produce (with the exception of white potatoes)  Eat less bread/pasta/rice/snack foods/cereals/sweets and other items from the middle of the grocery store (processed carbs)   Make an appt with Dr Lorelei Pont on the way out   Think about some low impact exercise

## 2019-04-06 NOTE — Progress Notes (Signed)
Subjective:    Patient ID: Carol Johnson, female    DOB: Sep 15, 1965, 53 y.o.   MRN: [id number]  HPI  Here for f/u of chronic medical problems   She is working from home during the pandemic   Weight  Wt Readings from Last 3 Encounters:  04/06/19 233 lb 5 oz (105.8 kg)  08/02/18 222 lb 6.4 oz (100.9 kg)  03/20/18 246 lb (111.6 kg)  wt is back up 41.33 kg/m   Not getting exercise  She has a lot of ankle pain - achilles pain  Also knee pain  Plans to join weight watchers   She did not do well with [place]-due to UC acting up  Thinking about an alternative  Smoking status -former  Hypertension  bp is stable today  No cp or palpitations or headaches or edema  No side effects to medicines  BP Readings from Last 3 Encounters:  04/06/19 128/76  08/02/18 128/76  03/20/18 120/80      Losartan-tolerating   Depression/anxiety -some days are worse than others  Stressors / worries/pandemic affect her  Sons are home during school  wellbutrin xl 300 celexa -not taking right now [employer] cr 12.5 mg-insomnia (takes part of one prn)  Some nights stays awake all night -but not all the time    Hyperlipidemia She stopped the atorvastatin - the cardiologist thought it may be adding to her breathing problems  Was on atorvastatin 10  ? If it made a difference  Lab Results  Component Value Date   CHOL 240 (H) 08/04/2017   HDL 35.60 (L) 08/04/2017   LDLCALC 155 (H) 07/18/2015   LDLDIRECT 193.0 08/04/2017   TRIG 218.0 (H) 08/04/2017   CHOLHDL 7 08/04/2017   H/o B12 def Lab Results  Component Value Date   VITAMINB12 428 08/04/2017   Patient Active Problem List   Diagnosis Date Noted  . Coronary artery disease involving native coronary artery of native heart without angina pectoris 03/21/2018  . NSIP (nonspecific interstitial pneumonia) ([place]) 02/23/2018  . Coronary artery calcification 02/10/2018  . Morbid obesity ([place]) 02/10/2018  . Smoker 01/15/2018  . Dyspnea on  exertion 12/21/2017  . Carpal tunnel syndrome 12/21/2017  . Stress incontinence 12/21/2017  . Paresthesia of both feet 07/04/2017  . Always thirsty 07/04/2017  . Cough 05/02/2016  . Abdominal bloating 12/01/2015  . Fatigue 12/01/2015  . Vitamin B12 deficiency 12/01/2015  . Lymphocytic colitis 01/16/2015  . Anemia, iron deficiency 01/16/2015  . Anxiety state 12/20/2014  . Diarrhea 11/27/2014  . Insomnia 11/27/2014  . History of DVT (deep vein thrombosis) 09/17/2014  . OSA (obstructive sleep apnea) 09/02/2014  . Gastritis, chronic 06/06/2014  . Unspecified gastritis and gastroduodenitis without mention of hemorrhage 05/03/2014  . Abdominal pain, epigastric 04/19/2014  . Internal hemorrhoids with other complication 82/99/3716  . Class 3 severe obesity due to excess calories with serious comorbidity and body mass index (BMI) of 40.0 to 44.9 in adult ([place]) 06/27/2013  . Routine general medical examination at a health care facility 10/14/2011  . Thyroid nodule 05/16/2011  . Rectal bleeding 11/24/2010  . Hemorrhoids, internal 11/24/2010  . Irritable bowel syndrome (IBS) 11/24/2010  . ANEMIA 10/02/2009  . Hyperlipidemia 07/18/2009  . Essential hypertension 07/18/2009  . DYSPNEA ON EXERTION 07/18/2009  . THYROID CYST 01/31/2008  . ANXIETY 01/31/2008  . Depression with anxiety 01/31/2008  . GASTRIC ULCER, HX OF 01/31/2008  . FIBROIDS, UTERUS 11/13/2007  . ALLERGIC RHINITIS 11/13/2007  . ASTHMA 11/13/2007  .  GERD 11/13/2007  . IRRITABLE BOWEL SYNDROME 11/13/2007  . INTERNAL HEMORRHOIDS 11/12/2003   Past Medical History:  Diagnosis Date  . Abdominal pain, unspecified site   . Allergic rhinitis   . Anxiety   . Asthma   . Depression   . DVT (deep venous thrombosis) ([place]) 2016  . Fibroid uterus   . GERD (gastroesophageal reflux disease)   . Hypercholesterolemia   . Hypertension   . Internal hemorrhoid   . Irritable bowel syndrome   . Lymphocytic colitis 01/02/2015  .  Pneumonia 1998  . Ulcerative colitis ([place])   . URI (upper respiratory infection)   . Uterine fibroid   . Vitamin B12 deficiency    Past Surgical History:  Procedure Laterality Date  . CESAREAN SECTION  2004  . CHOLECYSTECTOMY     laparoscopic "gallstones"  . ESOPHAGOGASTRODUODENOSCOPY (EGD) WITH PROPOFOL N/A 05/03/2014   Procedure: ESOPHAGOGASTRODUODENOSCOPY (EGD) WITH PROPOFOL;  Surgeon: Inda Castle, MD;  Location: [facility];  Service: Endoscopy;  Laterality: N/A;  . MYOMECTOMY    . UTERINE FIBROID SURGERY     Social History   Tobacco Use  . Smoking status: Former Smoker    Packs/day: 0.10    Years: 10.00    Pack years: 1.00    Types: Cigarettes    Quit date: 12/07/2017    Years since quitting: 1.3  . Smokeless tobacco: Never Used  Substance Use Topics  . Alcohol use: Yes    Alcohol/week: 5.0 standard drinks    Types: 5 Cans of beer per week  . Drug use: No   Family History  Problem Relation Age of Onset  . Pulmonary fibrosis Father   . Colon cancer Paternal Aunt   . Uterine cancer Paternal Grandmother        with mets to the colon   . Heart failure Maternal Grandmother   . Heart disease Paternal Uncle   . Heart disease Paternal Aunt   . Kidney disease Cousin   . Hyperlipidemia Mother   . Mitral valve prolapse Mother   . Heart disease Mother        arrhythmia   Allergies  Allergen Reactions  . Sertraline Hcl     REACTION: diarrhea  . Zoloft [Sertraline Hcl] Diarrhea   Current Outpatient Medications on File Prior to Visit  Medication Sig Dispense Refill  . atorvastatin (LIPITOR) 10 MG tablet Take 1 tablet (10 mg total) daily by mouth. 30 tablet 11  . fexofenadine (ALLEGRA) 180 MG tablet Take 180 mg by mouth daily as needed.     . fluticasone (FLONASE) 50 MCG/ACT nasal spray Place 2 sprays into both nostrils daily. (Patient taking differently: Place 2 sprays daily as needed into both nostrils. ) 16 g 6  . losartan (COZAAR) 25 MG tablet TAKE 1 TABLET BY  MOUTH EVERY DAY 60 tablet 0  . mesalamine (LIALDA) 1.2 g EC tablet TAKE 2 TABLETS (2.4 G TOTAL) BY MOUTH 2 (TWO) TIMES DAILY. 60 tablet 2  . zolpidem (AMBIEN CR) 12.5 MG CR tablet TAKE 1 TABLET (12.5 MG TOTAL) BY MOUTH AT BEDTIME AS NEEDED FOR SLEEP 30 tablet 3  . albuterol (PROVENTIL HFA;VENTOLIN HFA) 108 (90 Base) MCG/ACT inhaler Inhale 2 puffs into the lungs every 4 (four) hours as needed for wheezing or shortness of breath (cough, shortness of breath or wheezing.). (Patient not taking: Reported on 04/06/2019) 1 Inhaler 1  . meloxicam (MOBIC) 15 MG tablet TAKE 1 TABLET (15 MG TOTAL) BY MOUTH DAILY AS NEEDED FOR PAIN  WITH FOOD (Patient not taking: Reported on 04/06/2019) 30 tablet 3  . metoprolol succinate (TOPROL-XL) 25 MG 24 hr tablet Take 1 tablet (25 mg total) by mouth daily. Take with or immediately following a meal. (Patient not taking: Reported on 04/06/2019) 90 tablet 3   No current facility-administered medications on file prior to visit.       Review of Systems  Constitutional: Positive for fatigue. Negative for activity change, appetite change, fever and unexpected weight change.  HENT: Negative for congestion, ear pain, rhinorrhea, sinus pressure and sore throat.   Eyes: Negative for pain, redness and visual disturbance.  Respiratory: Negative for cough, shortness of breath and wheezing.        Baseline sob on exertion   Cardiovascular: Negative for chest pain and palpitations.  Gastrointestinal: Negative for abdominal pain, blood in stool, constipation and diarrhea.  Endocrine: Negative for polydipsia and polyuria.  Genitourinary: Negative for dysuria, frequency and urgency.  Musculoskeletal: Positive for arthralgias. Negative for back pain and myalgias.  Skin: Negative for pallor and rash.  Allergic/Immunologic: Negative for environmental allergies.  Neurological: Negative for dizziness, syncope and headaches.  Hematological: Negative for adenopathy. Does not bruise/bleed easily.   Psychiatric/Behavioral: Positive for dysphoric mood and sleep disturbance. Negative for decreased concentration and suicidal ideas. The patient is nervous/anxious.        Objective:   Physical Exam Constitutional:      General: She is not in acute distress.    Appearance: Normal appearance. She is well-developed. She is obese. She is not ill-appearing or diaphoretic.  HENT:     Head: Normocephalic and atraumatic.     Mouth/Throat:     Mouth: Mucous membranes are moist.     Pharynx: Oropharynx is clear. No posterior oropharyngeal erythema.  Eyes:     General: No scleral icterus.    Conjunctiva/sclera: Conjunctivae normal.     Pupils: Pupils are equal, round, and reactive to light.  Neck:     Musculoskeletal: Normal range of motion and neck supple. No neck rigidity or muscular tenderness.     Thyroid: No thyromegaly.     Vascular: No carotid bruit or JVD.  Cardiovascular:     Rate and Rhythm: Normal rate and regular rhythm.     Heart sounds: Normal heart sounds. No gallop.   Pulmonary:     Effort: Pulmonary effort is normal. No respiratory distress.     Breath sounds: Normal breath sounds. No wheezing or rales.  Abdominal:     General: Bowel sounds are normal. There is no distension or abdominal bruit.     Palpations: Abdomen is soft. There is no mass.     Tenderness: There is no abdominal tenderness.     Hernia: No hernia is present.  Musculoskeletal:        General: Tenderness present.     Right lower leg: No edema.     Left lower leg: No edema.     Comments: Tenderness over achilles bilat  Lymphadenopathy:     Cervical: No cervical adenopathy.  Skin:    General: Skin is warm and dry.     Findings: No rash.  Neurological:     Mental Status: She is alert. Mental status is at baseline.     Cranial Nerves: No cranial nerve deficit.     Sensory: No sensory deficit.     Coordination: Coordination normal.     Deep Tendon Reflexes: Reflexes are normal and symmetric. Reflexes  normal.  Psychiatric:  Mood and Affect: Mood normal.        Cognition and Memory: Cognition and memory normal.     Comments: Mood is good today           Assessment & Plan:   Problem List Items Addressed This Visit      Cardiovascular and Mediastinum   Essential hypertension - Primary    bp in fair control at this time  BP Readings from Last 1 Encounters:  04/06/19 128/76   No changes needed Most recent labs reviewed and labs ordered Disc lifstyle change with low sodium diet and exercise        Relevant Medications   hydrochlorothiazide (MICROZIDE) 12.5 MG capsule   Other Relevant Orders   CBC with Differential/Platelet (Completed)   Comprehensive metabolic panel (Completed)   Lipid panel (Completed)   TSH (Completed)     Other   Hyperlipidemia    Due for labs Disc goals for lipids and reasons to control them Rev last labs with pt Rev low sat fat diet in detail  She was previously on atorvastatin- and said cardiology wondered if it added to her breathing problems  Unsure if this was the case       Relevant Medications   hydrochlorothiazide (MICROZIDE) 12.5 MG capsule   Other Relevant Orders   Lipid panel (Completed)   Depression with anxiety    Plans to re start celexa This and wellbutrin refilled Reviewed stressors/ coping techniques/symptoms/ support sources/ tx options and side effects in detail today  Disc imp of self care -incl exercise and time to sleep      Relevant Medications   buPROPion (WELLBUTRIN XL) 300 MG 24 hr tablet   citalopram (CELEXA) 20 MG tablet   Insomnia    Continues prn ambien and tx of dep/anx      Vitamin B12 deficiency    Labs today  Some fatigue  She takes ppi that can limit abs      Relevant Orders   Vitamin B12 (Completed)   Morbid obesity ([place])    Discussed how this problem influences overall health and the risks it imposes  Reviewed plan for weight loss with lower calorie diet (via better food choices and  also portion control or program like weight watchers) and exercise building up to or more than 30 minutes 5 days per week including some aerobic activity

## 2019-04-07 NOTE — Progress Notes (Signed)
Kolin Erdahl T. Derran Sear, MD [facility] [facility] [place], [address] Phone: [phone]  FAX: [phone]  Carol Johnson - 53 y.o. female  MRN [id number]  Date of Birth: [birth year]  Visit Date: 04/09/2019  PCP: Abner Greenspan, MD  Referred by: Tower, Wynelle Fanny, MD  Chief Complaint  Patient presents with  . Ankle Pain    Left x 6 months  . Knee Pain    Trouble going down stairs   Subjective:   Carol Johnson is a 53 y.o. very pleasant female patient with Body mass index is 41.36 kg/m. who presents with the following:  Nice lady, Body mass index is 41.36 kg/m.  She presents with left-sided posterior heel pain going on for about 6 months.  She does have a bump there and it is tender to palpation.  She also has bilateral knee pain, notably pain with going downstairs for any number of months, approximately 6 months.  She is not had any prior injuries, surgery, fractures or anything of the sort.  She does take meloxicam occasionally.  Rare Tylenol.  Past Medical History, Surgical History, Social History, Family History, Problem List, Medications, and Allergies have been reviewed and updated if relevant.  Patient Active Problem List   Diagnosis Date Noted  . Coronary artery disease involving native coronary artery of native heart without angina pectoris 03/21/2018  . Coronary artery calcification 02/10/2018  . Morbid obesity ([place]) 02/10/2018  . Smoker 01/15/2018  . Dyspnea on exertion 12/21/2017  . Carpal tunnel syndrome 12/21/2017  . Stress incontinence 12/21/2017  . Paresthesia of both feet 07/04/2017  . Always thirsty 07/04/2017  . Abdominal bloating 12/01/2015  . Fatigue 12/01/2015  . Vitamin B12 deficiency 12/01/2015  . Lymphocytic colitis 01/16/2015  . Anemia, iron deficiency 01/16/2015  . Anxiety state 12/20/2014  . Diarrhea 11/27/2014  . Insomnia 11/27/2014  . History of DVT (deep vein  thrombosis) 09/17/2014  . OSA (obstructive sleep apnea) 09/02/2014  . Gastritis, chronic 06/06/2014  . Unspecified gastritis and gastroduodenitis without mention of hemorrhage 05/03/2014  . Abdominal pain, epigastric 04/19/2014  . Internal hemorrhoids with other complication 09/07/3233  . Routine general medical examination at a health care facility 10/14/2011  . Thyroid nodule 05/16/2011  . Rectal bleeding 11/24/2010  . Hemorrhoids, internal 11/24/2010  . Irritable bowel syndrome (IBS) 11/24/2010  . ANEMIA 10/02/2009  . Hyperlipidemia 07/18/2009  . Essential hypertension 07/18/2009  . DYSPNEA ON EXERTION 07/18/2009  . THYROID CYST 01/31/2008  . ANXIETY 01/31/2008  . Depression with anxiety 01/31/2008  . GASTRIC ULCER, HX OF 01/31/2008  . FIBROIDS, UTERUS 11/13/2007  . ALLERGIC RHINITIS 11/13/2007  . ASTHMA 11/13/2007  . GERD 11/13/2007  . IRRITABLE BOWEL SYNDROME 11/13/2007  . INTERNAL HEMORRHOIDS 11/12/2003    Past Medical History:  Diagnosis Date  . Abdominal pain, unspecified site   . Allergic rhinitis   . Anxiety   . Asthma   . Depression   . DVT (deep venous thrombosis) ([place]) 2016  . Fibroid uterus   . GERD (gastroesophageal reflux disease)   . Hypercholesterolemia   . Hypertension   . Internal hemorrhoid   . Irritable bowel syndrome   . Lymphocytic colitis 01/02/2015  . Pneumonia 1998  . Ulcerative colitis ([place])   . URI (upper respiratory infection)   . Uterine fibroid   . Vitamin B12 deficiency     Past Surgical History:  Procedure Laterality Date  .  CESAREAN SECTION  2004  . CHOLECYSTECTOMY     laparoscopic "gallstones"  . ESOPHAGOGASTRODUODENOSCOPY (EGD) WITH PROPOFOL N/A 05/03/2014   Procedure: ESOPHAGOGASTRODUODENOSCOPY (EGD) WITH PROPOFOL;  Surgeon: Inda Castle, MD;  Location: [facility];  Service: Endoscopy;  Laterality: N/A;  . MYOMECTOMY    . UTERINE FIBROID SURGERY      Social History   Socioeconomic History  . Marital status:  Married    Spouse name: Not on file  . Number of children: 1  . Years of education: Not on file  . Highest education level: Not on file  Occupational History  . Occupation: Admin. assistant  Social Needs  . Financial resource strain: Not on file  . Food insecurity    Worry: Not on file    Inability: Not on file  . Transportation needs    Medical: Not on file    Non-medical: Not on file  Tobacco Use  . Smoking status: Former Smoker    Packs/day: 0.10    Years: 10.00    Pack years: 1.00    Types: Cigarettes    Quit date: 12/07/2017    Years since quitting: 1.3  . Smokeless tobacco: Never Used  Substance and Sexual Activity  . Alcohol use: Yes    Alcohol/week: 5.0 standard drinks    Types: 5 Cans of beer per week  . Drug use: No  . Sexual activity: Yes    Birth control/protection: Pill  Lifestyle  . Physical activity    Days per week: Not on file    Minutes per session: Not on file  . Stress: Not on file  Relationships  . Social Herbalist on phone: Not on file    Gets together: Not on file    Attends religious service: Not on file    Active member of club or organization: Not on file    Attends meetings of clubs or organizations: Not on file    Relationship status: Not on file  . Intimate partner violence    Fear of current or ex partner: Not on file    Emotionally abused: Not on file    Physically abused: Not on file    Forced sexual activity: Not on file  Other Topics Concern  . Not on file  Social History Narrative  . Not on file    Family History  Problem Relation Age of Onset  . Pulmonary fibrosis Father   . Colon cancer Paternal Aunt   . Uterine cancer Paternal Grandmother        with mets to the colon   . Heart failure Maternal Grandmother   . Heart disease Paternal Uncle   . Heart disease Paternal Aunt   . Kidney disease Cousin   . Hyperlipidemia Mother   . Mitral valve prolapse Mother   . Heart disease Mother        arrhythmia     Allergies  Allergen Reactions  . Sertraline Hcl     REACTION: diarrhea  . Zoloft [Sertraline Hcl] Diarrhea    Medication list reviewed and updated in full in [place].  GEN: No fevers, chills. Nontoxic. Primarily MSK c/o today. MSK: Detailed in the HPI GI: tolerating PO intake without difficulty Neuro: No numbness, parasthesias, or tingling associated. Otherwise the pertinent positives of the ROS are noted above.   Objective:   BP 114/70   Pulse 88   Temp 98.3 F (36.8 C) (Temporal)   Ht 5' 3"  (1.6 m)  Wt 233 lb 8 oz (105.9 kg)   LMP 11/29/2015   SpO2 97%   BMI 41.36 kg/m    GEN: Well-developed,well-nourished,in no acute distress; alert,appropriate and cooperative throughout examination HEENT: Normocephalic and atraumatic without obvious abnormalities. Ears, externally no deformities PULM: Breathing comfortably in no respiratory distress EXT: No clubbing, cyanosis, or edema PSYCH: Normally interactive. Cooperative during the interview. Pleasant. Friendly and conversant. Not anxious or depressed appearing. Normal, full affect.  Foot: LEFT Echymosis: no Edema: no ROM: full LE B Gait: heel toe, non-antalgic MT pain: no Callus pattern: none Lateral Mall: NT Medial Mall: NT Talus: NT Navicular: NT Cuboid: NT Calcaneous: NT Metatarsals: NT 5th MT: NT Phalanges: NT Achilles: PAINFUL TO PALPATE AT INSERTION ON LEFT, SMALL NODULE Plantar Fascia: NT Fat Pad: NT Peroneals: NT Post Tib: NT Great Toe: Nml motion Ant Drawer: neg ATFL: NT CFL: NT Deltoid: NT Sensation: intact   Bilateral knees: Full extension, flexion to 130.  No significant effusion.  Patellar facets are nontender.  She does have significant medial joint line tenderness bilaterally.  All ligamentous structures are intact.  No significant pain with flexion pinch or McMurray's.  Radiology: Dg Knee Bilateral Standing Ap  Result Date: 04/09/2019 CLINICAL DATA:  Knee pain EXAM: BILATERAL  KNEES STANDING - 1 VIEW COMPARISON:  None. FINDINGS: No acute fracture or dislocation is noted. No significant joint space narrowing is noted. No soft tissue abnormality is seen. IMPRESSION: No acute abnormality noted. Electronically Signed   By: Inez Catalina M.D.   On: 04/09/2019 14:07     Assessment and Plan:     ICD-10-CM   1. Bilateral chronic knee pain  M25.561 DG Knee Bilateral Standing AP   M25.562    G89.29   2. Achilles tendinitis of left lower extremity  M76.62    Classic Achilles tendinopathy.  Begin rehab protocol, predominantly eccentric's.  Start seated, then progressed to standing as tolerated.    My read there is some bilateral medial osteoarthritis, minimal to mild, with minimal joint space narrowing but subchondral sclerosis. Rehab this as well  If minimal progress in a few weeks, will refer to PT  Follow-up: No follow-ups on file.  No orders of the defined types were placed in this encounter.  Orders Placed This Encounter  Procedures  . DG Knee Bilateral Standing AP    Signed,  Arelys Glassco T. Tradarius Reinwald, MD   Outpatient Encounter Medications as of 04/09/2019  Medication Sig  . albuterol (PROVENTIL HFA;VENTOLIN HFA) 108 (90 Base) MCG/ACT inhaler Inhale 2 puffs into the lungs every 4 (four) hours as needed for wheezing or shortness of breath (cough, shortness of breath or wheezing.).  Marland Kitchen atorvastatin (LIPITOR) 10 MG tablet Take 1 tablet (10 mg total) daily by mouth.  Marland Kitchen buPROPion (WELLBUTRIN XL) 300 MG 24 hr tablet Take 1 tablet (300 mg total) by mouth daily.  . citalopram (CELEXA) 20 MG tablet Take 1 tablet (20 mg total) by mouth daily.  . fexofenadine (ALLEGRA) 180 MG tablet Take 180 mg by mouth daily as needed.   . fluticasone (FLONASE) 50 MCG/ACT nasal spray Place 2 sprays into both nostrils daily. (Patient taking differently: Place 2 sprays daily as needed into both nostrils. )  . hydrochlorothiazide (MICROZIDE) 12.5 MG capsule Take 1 capsule (12.5 mg total) by  mouth daily.  Marland Kitchen losartan (COZAAR) 25 MG tablet TAKE 1 TABLET BY MOUTH EVERY DAY  . meloxicam (MOBIC) 15 MG tablet TAKE 1 TABLET (15 MG TOTAL) BY MOUTH DAILY AS NEEDED FOR PAIN  WITH FOOD  . mesalamine (LIALDA) 1.2 g EC tablet TAKE 2 TABLETS (2.4 G TOTAL) BY MOUTH 2 (TWO) TIMES DAILY.  . metoprolol succinate (TOPROL-XL) 25 MG 24 hr tablet Take 1 tablet (25 mg total) by mouth daily. Take with or immediately following a meal.  . pantoprazole (PROTONIX) 40 MG tablet Take 1 tablet (40 mg total) by mouth daily.  Marland Kitchen zolpidem (AMBIEN CR) 12.5 MG CR tablet TAKE 1 TABLET (12.5 MG TOTAL) BY MOUTH AT BEDTIME AS NEEDED FOR SLEEP   No facility-administered encounter medications on file as of 04/09/2019.

## 2019-04-08 NOTE — Assessment & Plan Note (Signed)
Labs today  Some fatigue  She takes ppi that can limit abs

## 2019-04-08 NOTE — Assessment & Plan Note (Signed)
Due for labs Disc goals for lipids and reasons to control them Rev last labs with pt Rev low sat fat diet in detail  She was previously on atorvastatin- and said cardiology wondered if it added to her breathing problems  Unsure if this was the case

## 2019-04-08 NOTE — Assessment & Plan Note (Signed)
Discussed how this problem influences overall health and the risks it imposes  Reviewed plan for weight loss with lower calorie diet (via better food choices and also portion control or program like weight watchers) and exercise building up to or more than 30 minutes 5 days per week including some aerobic activity    

## 2019-04-08 NOTE — Assessment & Plan Note (Signed)
bp in fair control at this time  BP Readings from Last 1 Encounters:  04/06/19 128/76   No changes needed Most recent labs reviewed and labs ordered Disc lifstyle change with low sodium diet and exercise

## 2019-04-08 NOTE — Assessment & Plan Note (Signed)
Plans to re start celexa This and wellbutrin refilled Reviewed stressors/ coping techniques/symptoms/ support sources/ tx options and side effects in detail today  Disc imp of self care -incl exercise and time to sleep

## 2019-04-08 NOTE — Assessment & Plan Note (Signed)
Continues prn ambien and tx of dep/anx

## 2019-04-09 ENCOUNTER — Other Ambulatory Visit: Payer: Self-pay

## 2019-04-09 ENCOUNTER — Encounter: Payer: Self-pay | Admitting: Family Medicine

## 2019-04-09 ENCOUNTER — Ambulatory Visit (INDEPENDENT_AMBULATORY_CARE_PROVIDER_SITE_OTHER)
Admission: RE | Admit: 2019-04-09 | Discharge: 2019-04-09 | Disposition: A | Discharge status: Home / Self Care | Payer: BC Managed Care – PPO | Source: Ambulatory Visit | Attending: Family Medicine | Admitting: Family Medicine

## 2019-04-09 ENCOUNTER — Ambulatory Visit: Payer: BC Managed Care – PPO | Admitting: Family Medicine

## 2019-04-09 VITALS — BP 114/70 | HR 88 | Temp 98.3°F | Ht 63.0 in | Wt 233.5 lb

## 2019-04-09 DIAGNOSIS — M25562 Pain in left knee: Secondary | ICD-10-CM | POA: Diagnosis not present

## 2019-04-09 DIAGNOSIS — M7662 Achilles tendinitis, left leg: Secondary | ICD-10-CM

## 2019-04-09 DIAGNOSIS — M25561 Pain in right knee: Secondary | ICD-10-CM

## 2019-04-09 DIAGNOSIS — G8929 Other chronic pain: Secondary | ICD-10-CM

## 2019-04-09 DIAGNOSIS — M170 Bilateral primary osteoarthritis of knee: Secondary | ICD-10-CM | POA: Diagnosis not present

## 2019-04-09 MED ORDER — PREDNISONE 20 MG PO TABS: 20.0000 mg | ORAL_TABLET | Freq: Every day | ORAL | 0 refills | Status: DC

## 2019-04-09 NOTE — Patient Instructions (Signed)
[facility]    Calf raises on a step While seated, go up on both toes, and then lower slowly  Begin with 3 sets of 10 repetitions  Increase by 5 repetitions every 3 days  Goal is 3 sets of 30 repetitions  Increase by 5 lbs per week to max of 30 lbs

## 2019-04-19 DIAGNOSIS — G8929 Other chronic pain: Secondary | ICD-10-CM | POA: Diagnosis not present

## 2019-04-19 DIAGNOSIS — M7662 Achilles tendinitis, left leg: Secondary | ICD-10-CM | POA: Diagnosis not present

## 2019-04-19 DIAGNOSIS — M25561 Pain in right knee: Secondary | ICD-10-CM | POA: Diagnosis not present

## 2019-04-19 DIAGNOSIS — M25562 Pain in left knee: Secondary | ICD-10-CM | POA: Diagnosis not present

## 2019-05-03 ENCOUNTER — Other Ambulatory Visit: Payer: Self-pay | Admitting: Family Medicine

## 2019-05-03 NOTE — Telephone Encounter (Signed)
Name of Medication: Ambien CR Name of Pharmacy: [facility] Henrietta Dine or Written Date and Quantity: 10/16/18 #30 tabs with 3 refills Last Office Visit and Type: Med refilled on 04/06/19 Next Office Visit and Type: none scheduled with PCP (Dr. Lorelei Pont: f/u on 06/06/19) Last Controlled Substance Agreement Date: 12/01/15 Last UDS:12/01/15

## 2019-05-08 ENCOUNTER — Other Ambulatory Visit: Payer: Self-pay | Admitting: Internal Medicine

## 2019-05-08 MED ORDER — LOSARTAN POTASSIUM 25 MG PO TABS: 25.0000 mg | ORAL_TABLET | Freq: Every day | ORAL | 0 refills | Status: DC

## 2019-05-21 ENCOUNTER — Encounter: Payer: Self-pay | Admitting: Family Medicine

## 2019-05-21 ENCOUNTER — Other Ambulatory Visit: Payer: Self-pay | Admitting: Family Medicine

## 2019-05-21 DIAGNOSIS — Z1231 Encounter for screening mammogram for malignant neoplasm of breast: Secondary | ICD-10-CM

## 2019-05-23 ENCOUNTER — Encounter: Payer: Self-pay | Admitting: Family Medicine

## 2019-05-23 ENCOUNTER — Emergency Department (HOSPITAL_COMMUNITY): Payer: BC Managed Care – PPO

## 2019-05-23 ENCOUNTER — Inpatient Hospital Stay (HOSPITAL_COMMUNITY): Payer: BC Managed Care – PPO

## 2019-05-23 ENCOUNTER — Other Ambulatory Visit: Payer: Self-pay

## 2019-05-23 ENCOUNTER — Inpatient Hospital Stay (HOSPITAL_COMMUNITY)
Admission: EM | Admit: 2019-05-23 | Discharge: 2019-05-25 | DRG: 065 | Disposition: A | Discharge status: Rehabilitation Facility | Payer: BC Managed Care – PPO | Attending: Internal Medicine | Admitting: Internal Medicine

## 2019-05-23 DIAGNOSIS — D62 Acute posthemorrhagic anemia: Secondary | ICD-10-CM | POA: Diagnosis not present

## 2019-05-23 DIAGNOSIS — D473 Essential (hemorrhagic) thrombocythemia: Secondary | ICD-10-CM | POA: Diagnosis present

## 2019-05-23 DIAGNOSIS — Z87891 Personal history of nicotine dependence: Secondary | ICD-10-CM

## 2019-05-23 DIAGNOSIS — I639 Cerebral infarction, unspecified: Secondary | ICD-10-CM

## 2019-05-23 DIAGNOSIS — I10 Essential (primary) hypertension: Secondary | ICD-10-CM | POA: Diagnosis not present

## 2019-05-23 DIAGNOSIS — F418 Other specified anxiety disorders: Secondary | ICD-10-CM | POA: Diagnosis present

## 2019-05-23 DIAGNOSIS — Z8673 Personal history of transient ischemic attack (TIA), and cerebral infarction without residual deficits: Secondary | ICD-10-CM | POA: Diagnosis present

## 2019-05-23 DIAGNOSIS — E785 Hyperlipidemia, unspecified: Secondary | ICD-10-CM | POA: Diagnosis not present

## 2019-05-23 DIAGNOSIS — I6389 Other cerebral infarction: Secondary | ICD-10-CM | POA: Diagnosis not present

## 2019-05-23 DIAGNOSIS — J45909 Unspecified asthma, uncomplicated: Secondary | ICD-10-CM | POA: Diagnosis not present

## 2019-05-23 DIAGNOSIS — E669 Obesity, unspecified: Secondary | ICD-10-CM | POA: Diagnosis present

## 2019-05-23 DIAGNOSIS — I63031 Cerebral infarction due to thrombosis of right carotid artery: Secondary | ICD-10-CM | POA: Diagnosis not present

## 2019-05-23 DIAGNOSIS — K219 Gastro-esophageal reflux disease without esophagitis: Secondary | ICD-10-CM | POA: Diagnosis present

## 2019-05-23 DIAGNOSIS — Z8049 Family history of malignant neoplasm of other genital organs: Secondary | ICD-10-CM | POA: Diagnosis not present

## 2019-05-23 DIAGNOSIS — Z86718 Personal history of other venous thrombosis and embolism: Secondary | ICD-10-CM

## 2019-05-23 DIAGNOSIS — Z8349 Family history of other endocrine, nutritional and metabolic diseases: Secondary | ICD-10-CM

## 2019-05-23 DIAGNOSIS — I609 Nontraumatic subarachnoid hemorrhage, unspecified: Secondary | ICD-10-CM | POA: Diagnosis not present

## 2019-05-23 DIAGNOSIS — D75839 Thrombocytosis, unspecified: Secondary | ICD-10-CM | POA: Diagnosis present

## 2019-05-23 DIAGNOSIS — R531 Weakness: Secondary | ICD-10-CM | POA: Diagnosis not present

## 2019-05-23 DIAGNOSIS — Z6841 Body Mass Index (BMI) 40.0 and over, adult: Secondary | ICD-10-CM

## 2019-05-23 DIAGNOSIS — Z8249 Family history of ischemic heart disease and other diseases of the circulatory system: Secondary | ICD-10-CM | POA: Diagnosis not present

## 2019-05-23 DIAGNOSIS — Z888 Allergy status to other drugs, medicaments and biological substances status: Secondary | ICD-10-CM | POA: Diagnosis not present

## 2019-05-23 DIAGNOSIS — Z20828 Contact with and (suspected) exposure to other viral communicable diseases: Secondary | ICD-10-CM | POA: Diagnosis present

## 2019-05-23 DIAGNOSIS — R0602 Shortness of breath: Secondary | ICD-10-CM | POA: Diagnosis not present

## 2019-05-23 DIAGNOSIS — Z800 Family history of malignant neoplasm of digestive organs: Secondary | ICD-10-CM | POA: Diagnosis not present

## 2019-05-23 DIAGNOSIS — K51911 Ulcerative colitis, unspecified with rectal bleeding: Secondary | ICD-10-CM | POA: Diagnosis not present

## 2019-05-23 DIAGNOSIS — Z79899 Other long term (current) drug therapy: Secondary | ICD-10-CM | POA: Diagnosis not present

## 2019-05-23 DIAGNOSIS — F329 Major depressive disorder, single episode, unspecified: Secondary | ICD-10-CM | POA: Diagnosis not present

## 2019-05-23 DIAGNOSIS — R7989 Other specified abnormal findings of blood chemistry: Secondary | ICD-10-CM | POA: Diagnosis not present

## 2019-05-23 DIAGNOSIS — Z791 Long term (current) use of non-steroidal anti-inflammatories (NSAID): Secondary | ICD-10-CM | POA: Diagnosis not present

## 2019-05-23 DIAGNOSIS — G8194 Hemiplegia, unspecified affecting left nondominant side: Secondary | ICD-10-CM | POA: Diagnosis not present

## 2019-05-23 DIAGNOSIS — K51919 Ulcerative colitis, unspecified with unspecified complications: Secondary | ICD-10-CM | POA: Diagnosis not present

## 2019-05-23 DIAGNOSIS — R29810 Facial weakness: Secondary | ICD-10-CM | POA: Diagnosis not present

## 2019-05-23 DIAGNOSIS — Z23 Encounter for immunization: Secondary | ICD-10-CM

## 2019-05-23 DIAGNOSIS — Z9049 Acquired absence of other specified parts of digestive tract: Secondary | ICD-10-CM | POA: Diagnosis not present

## 2019-05-23 DIAGNOSIS — K5190 Ulcerative colitis, unspecified, without complications: Secondary | ICD-10-CM | POA: Diagnosis not present

## 2019-05-23 DIAGNOSIS — K5100 Ulcerative (chronic) pancolitis without complications: Secondary | ICD-10-CM | POA: Diagnosis not present

## 2019-05-23 DIAGNOSIS — E876 Hypokalemia: Secondary | ICD-10-CM | POA: Diagnosis not present

## 2019-05-23 DIAGNOSIS — I69354 Hemiplegia and hemiparesis following cerebral infarction affecting left non-dominant side: Secondary | ICD-10-CM | POA: Diagnosis not present

## 2019-05-23 HISTORY — DX: Cerebral infarction, unspecified: I63.9

## 2019-05-23 LAB — DIFFERENTIAL
Abs Immature Granulocytes: 0.03 10*3/uL (ref 0.00–0.07)
Basophils Absolute: 0.1 10*3/uL (ref 0.0–0.1)
Basophils Relative: 1 %
Eosinophils Absolute: 0.4 10*3/uL (ref 0.0–0.5)
Eosinophils Relative: 4 %
Immature Granulocytes: 0 %
Lymphocytes Relative: 27 %
Lymphs Abs: 2.6 10*3/uL (ref 0.7–4.0)
Monocytes Absolute: 0.6 10*3/uL (ref 0.1–1.0)
Monocytes Relative: 6 %
Neutro Abs: 6 10*3/uL (ref 1.7–7.7)
Neutrophils Relative %: 62 %

## 2019-05-23 LAB — CBC
HCT: 42.2 % (ref 36.0–46.0)
Hemoglobin: 12.9 g/dL (ref 12.0–15.0)
MCH: 25.1 pg — ABNORMAL LOW (ref 26.0–34.0)
MCHC: 30.6 g/dL (ref 30.0–36.0)
MCV: 82.1 fL (ref 80.0–100.0)
Platelets: 469 10*3/uL — ABNORMAL HIGH (ref 150–400)
RBC: 5.14 MIL/uL — ABNORMAL HIGH (ref 3.87–5.11)
RDW: 15.1 % (ref 11.5–15.5)
WBC: 9.6 10*3/uL (ref 4.0–10.5)
nRBC: 0 % (ref 0.0–0.2)

## 2019-05-23 LAB — COMPREHENSIVE METABOLIC PANEL
ALT: 29 U/L (ref 0–44)
AST: 25 U/L (ref 15–41)
Albumin: 3.9 g/dL (ref 3.5–5.0)
Alkaline Phosphatase: 83 U/L (ref 38–126)
Anion gap: 13 (ref 5–15)
BUN: 8 mg/dL (ref 6–20)
CO2: 21 mmol/L — ABNORMAL LOW (ref 22–32)
Calcium: 9.7 mg/dL (ref 8.9–10.3)
Chloride: 104 mmol/L (ref 98–111)
Creatinine, Ser: 0.9 mg/dL (ref 0.44–1.00)
GFR calc Af Amer: >60 mL/min (ref >60)
GFR calc non Af Amer: >60 mL/min (ref >60)
Glucose, Bld: 109 mg/dL — ABNORMAL HIGH (ref 70–99)
Potassium: 4 mmol/L (ref 3.5–5.1)
Sodium: 138 mmol/L (ref 135–145)
Total Bilirubin: 0.6 mg/dL (ref 0.3–1.2)
Total Protein: 7.9 g/dL (ref 6.5–8.1)

## 2019-05-23 LAB — I-STAT CHEM 8, ED
BUN: 8 mg/dL (ref 6–20)
Calcium, Ion: 1.19 mmol/L (ref 1.15–1.40)
Chloride: 105 mmol/L (ref 98–111)
Creatinine, Ser: 0.7 mg/dL (ref 0.44–1.00)
Glucose, Bld: 105 mg/dL — ABNORMAL HIGH (ref 70–99)
HCT: 43 % (ref 36.0–46.0)
Hemoglobin: 14.6 g/dL (ref 12.0–15.0)
Potassium: 4.1 mmol/L (ref 3.5–5.1)
Sodium: 140 mmol/L (ref 135–145)
TCO2: 23 mmol/L (ref 22–32)

## 2019-05-23 LAB — SARS CORONAVIRUS 2 (TAT 6-24 HRS): SARS Coronavirus 2: NEGATIVE

## 2019-05-23 LAB — PROTIME-INR
INR: 0.9 (ref 0.8–1.2)
Prothrombin Time: 12.3 seconds (ref 11.4–15.2)

## 2019-05-23 LAB — CBG MONITORING, ED: Glucose-Capillary: 103 mg/dL — ABNORMAL HIGH (ref 70–99)

## 2019-05-23 LAB — APTT: aPTT: 27 seconds (ref 24–36)

## 2019-05-23 MED ORDER — LORAZEPAM 2 MG/ML IJ SOLN
1.0000 mg | Freq: Once | INTRAMUSCULAR | Status: AC | PRN
  Administered 2019-05-23: 1 mg via INTRAVENOUS
  Filled 2019-05-23: qty 1

## 2019-05-23 MED ORDER — ACETAMINOPHEN 650 MG RE SUPP: 650.0000 mg | RECTAL | Status: DC | PRN

## 2019-05-23 MED ORDER — ASPIRIN EC 81 MG PO TBEC
81.0000 mg | DELAYED_RELEASE_TABLET | Freq: Every day | ORAL | Status: DC
  Administered 2019-05-24 – 2019-05-25 (×2): 81 mg via ORAL
  Filled 2019-05-23 (×2): qty 1

## 2019-05-23 MED ORDER — CLOPIDOGREL BISULFATE 75 MG PO TABS
75.0000 mg | ORAL_TABLET | Freq: Every day | ORAL | Status: DC
  Administered 2019-05-24 – 2019-05-25 (×2): 75 mg via ORAL
  Filled 2019-05-23 (×2): qty 1

## 2019-05-23 MED ORDER — LABETALOL HCL 5 MG/ML IV SOLN: 5.0000 mg | INTRAVENOUS | Status: DC | PRN

## 2019-05-23 MED ORDER — BUPROPION HCL ER (XL) 150 MG PO TB24
300.0000 mg | ORAL_TABLET | Freq: Every day | ORAL | Status: DC
  Administered 2019-05-23 – 2019-05-25 (×3): 300 mg via ORAL
  Filled 2019-05-23 (×3): qty 2

## 2019-05-23 MED ORDER — CITALOPRAM HYDROBROMIDE 10 MG PO TABS
20.0000 mg | ORAL_TABLET | Freq: Every day | ORAL | Status: DC
  Administered 2019-05-23 – 2019-05-25 (×3): 20 mg via ORAL
  Filled 2019-05-23 (×3): qty 2

## 2019-05-23 MED ORDER — ENOXAPARIN SODIUM 40 MG/0.4ML ~~LOC~~ SOLN
40.0000 mg | SUBCUTANEOUS | Status: DC
  Administered 2019-05-24 – 2019-05-25 (×2): 40 mg via SUBCUTANEOUS
  Filled 2019-05-23 (×3): qty 0.4

## 2019-05-23 MED ORDER — LORAZEPAM 2 MG/ML IJ SOLN: 1.0000 mg | Freq: Once | INTRAMUSCULAR | Status: DC

## 2019-05-23 MED ORDER — MESALAMINE 1.2 G PO TBEC
2.4000 g | DELAYED_RELEASE_TABLET | Freq: Two times a day (BID) | ORAL | Status: DC
  Administered 2019-05-24 – 2019-05-25 (×3): 2.4 g via ORAL
  Filled 2019-05-23 (×5): qty 2

## 2019-05-23 MED ORDER — ASPIRIN 300 MG RE SUPP: 300.0000 mg | Freq: Every day | RECTAL | Status: DC

## 2019-05-23 MED ORDER — PANTOPRAZOLE SODIUM 40 MG PO TBEC
40.0000 mg | DELAYED_RELEASE_TABLET | Freq: Every day | ORAL | Status: DC
  Administered 2019-05-24 – 2019-05-25 (×2): 40 mg via ORAL
  Filled 2019-05-23 (×2): qty 1

## 2019-05-23 MED ORDER — GADOBUTROL 1 MMOL/ML IV SOLN
10.0000 mL | Freq: Once | INTRAVENOUS | Status: AC | PRN
  Administered 2019-05-23: 10 mL via INTRAVENOUS

## 2019-05-23 MED ORDER — ATORVASTATIN CALCIUM 80 MG PO TABS
80.0000 mg | ORAL_TABLET | Freq: Every day | ORAL | Status: DC
  Administered 2019-05-23 – 2019-05-25 (×3): 80 mg via ORAL
  Filled 2019-05-23 (×3): qty 1

## 2019-05-23 MED ORDER — SODIUM CHLORIDE 0.9% FLUSH: 3.0000 mL | Freq: Once | INTRAVENOUS | Status: DC

## 2019-05-23 MED ORDER — ZOLPIDEM TARTRATE 5 MG PO TABS
5.0000 mg | ORAL_TABLET | Freq: Every evening | ORAL | Status: DC | PRN
  Administered 2019-05-24: 5 mg via ORAL
  Filled 2019-05-23 (×2): qty 1

## 2019-05-23 MED ORDER — LORAZEPAM 2 MG/ML IJ SOLN
1.0000 mg | Freq: Once | INTRAMUSCULAR | Status: AC
  Administered 2019-05-23: 1 mg via INTRAVENOUS
  Filled 2019-05-23: qty 1

## 2019-05-23 MED ORDER — STROKE: EARLY STAGES OF RECOVERY BOOK
Freq: Once | Status: AC
  Administered 2019-05-23: 21:00:00

## 2019-05-23 MED ORDER — ASPIRIN 325 MG PO TABS: 325.0000 mg | ORAL_TABLET | Freq: Every day | ORAL | Status: DC

## 2019-05-23 MED ORDER — ACETAMINOPHEN 160 MG/5ML PO SOLN: 650.0000 mg | ORAL | Status: DC | PRN

## 2019-05-23 MED ORDER — ACETAMINOPHEN 325 MG PO TABS
650.0000 mg | ORAL_TABLET | ORAL | Status: DC | PRN
  Administered 2019-05-25 (×3): 650 mg via ORAL
  Filled 2019-05-23 (×3): qty 2

## 2019-05-23 MED ORDER — ALBUTEROL SULFATE (2.5 MG/3ML) 0.083% IN NEBU
2.5000 mg | INHALATION_SOLUTION | Freq: Four times a day (QID) | RESPIRATORY_TRACT | Status: DC | PRN
  Administered 2019-05-24 – 2019-05-25 (×3): 2.5 mg via RESPIRATORY_TRACT
  Filled 2019-05-23 (×3): qty 3

## 2019-05-23 NOTE — ED Notes (Signed)
Pt returned from CT °

## 2019-05-23 NOTE — ED Notes (Signed)
Patient transported to CT 

## 2019-05-23 NOTE — ED Notes (Signed)
ED TO INPATIENT HANDOFF REPORT  ED Nurse Name and Phone #: Levada Dy [phone]  S Name/Age/Gender Carol Johnson 53 y.o. female Room/Bed: 054C/054C  Code Status   Code Status: Full Code  Home/SNF/Other Home Patient oriented to: self, place, time and situation Is this baseline? Yes   Triage Complete: Triage complete  Chief Complaint LEFT LEG AND ARM HEAVY  Triage Note To ED for eval of left side weakness and left facial droop. Pt states she went to sleep last night about 11pm and woke this am at 0430 unable to walk well and 'knocking things over'. Grips and leg strength equal and strong. Noted left facial droop and left arm drift. Denies HA. Speech is clear.    Allergies Allergies  Allergen Reactions  . Sertraline Hcl     REACTION: diarrhea  . Zoloft [Sertraline Hcl] Diarrhea    Level of Care/Admitting Diagnosis ED Disposition    ED Disposition Condition [facility] Area: [place] [[id number]]  Level of Care: Telemetry Medical [104]  Covid Evaluation: Asymptomatic Screening Protocol (No Symptoms)  Diagnosis: CVA (cerebral vascular accident) [facility]) [[id number]]  Admitting Physician: Norval Morton [[id number]]  Attending Physician: Norval Morton [[id number]]  Estimated length of stay: past midnight tomorrow  Certification:: I certify this patient will need inpatient services for at least 2 midnights  PT Class (Do Not Modify): Inpatient [101]  PT Acc Code (Do Not Modify): Private [1]       B Medical/Surgery History Past Medical History:  Diagnosis Date  . Abdominal pain, unspecified site   . Allergic rhinitis   . Anxiety   . Asthma   . Depression   . DVT (deep venous thrombosis) ([place]) 2016  . Fibroid uterus   . GERD (gastroesophageal reflux disease)   . Hypercholesterolemia   . Hypertension   . Internal hemorrhoid   . Irritable bowel syndrome   . Lymphocytic colitis 01/02/2015  . Pneumonia 1998  . Ulcerative colitis ([place])   . URI  (upper respiratory infection)   . Uterine fibroid   . Vitamin B12 deficiency    Past Surgical History:  Procedure Laterality Date  . CESAREAN SECTION  2004  . CHOLECYSTECTOMY     laparoscopic "gallstones"  . ESOPHAGOGASTRODUODENOSCOPY (EGD) WITH PROPOFOL N/A 05/03/2014   Procedure: ESOPHAGOGASTRODUODENOSCOPY (EGD) WITH PROPOFOL;  Surgeon: Inda Castle, MD;  Location: [facility];  Service: Endoscopy;  Laterality: N/A;  . MYOMECTOMY    . UTERINE FIBROID SURGERY       A IV Location/Drains/Wounds Patient Lines/Drains/Airways Status   Active Line/Drains/Airways    Name:   Placement date:   Placement time:   Site:   Days:   Peripheral IV 05/23/19 Right Antecubital   05/23/19    0750    Antecubital   less than 1          Intake/Output Last 24 hours No intake or output data in the 24 hours ending 05/23/19 2225  Labs/Imaging Results for orders placed or performed during the hospital encounter of 05/23/19 (from the past 48 hour(s))  Protime-INR     Status: None   Collection Time: 05/23/19  7:50 AM  Result Value Ref Range   Prothrombin Time 12.3 11.4 - 15.2 seconds   INR 0.9 0.8 - 1.2    Comment: (NOTE) INR goal varies based on device and disease states. Performed at [facility], [place] [address] [place], [place] [address]   APTT     Status:  None   Collection Time: 05/23/19  7:50 AM  Result Value Ref Range   aPTT 27 24 - 36 seconds    Comment: Performed at [place] [address], [place], [place] [address]  CBC     Status: Abnormal   Collection Time: 05/23/19  7:50 AM  Result Value Ref Range   WBC 9.6 4.0 - 10.5 K/uL   RBC 5.14 (H) 3.87 - 5.11 MIL/uL   Hemoglobin 12.9 12.0 - 15.0 g/dL   HCT 42.2 36.0 - 46.0 %   MCV 82.1 80.0 - 100.0 fL   MCH 25.1 (L) 26.0 - 34.0 pg   MCHC 30.6 30.0 - 36.0 g/dL   RDW 15.1 11.5 - 15.5 %   Platelets 469 (H) 150 - 400 K/uL   nRBC 0.0 0.0 - 0.2 %    Comment: Performed at [facility], [place] [address],  [place], [place] [address]  Differential     Status: None   Collection Time: 05/23/19  7:50 AM  Result Value Ref Range   Neutrophils Relative % 62 %   Neutro Abs 6.0 1.7 - 7.7 K/uL   Lymphocytes Relative 27 %   Lymphs Abs 2.6 0.7 - 4.0 K/uL   Monocytes Relative 6 %   Monocytes Absolute 0.6 0.1 - 1.0 K/uL   Eosinophils Relative 4 %   Eosinophils Absolute 0.4 0.0 - 0.5 K/uL   Basophils Relative 1 %   Basophils Absolute 0.1 0.0 - 0.1 K/uL   Immature Granulocytes 0 %   Abs Immature Granulocytes 0.03 0.00 - 0.07 K/uL    Comment: Performed at [facility], [place] [address], [place], [place] [address]  Comprehensive metabolic panel     Status: Abnormal   Collection Time: 05/23/19  7:50 AM  Result Value Ref Range   Sodium 138 135 - 145 mmol/L   Potassium 4.0 3.5 - 5.1 mmol/L   Chloride 104 98 - 111 mmol/L   CO2 21 (L) 22 - 32 mmol/L   Glucose, Bld 109 (H) 70 - 99 mg/dL   BUN 8 6 - 20 mg/dL   Creatinine, Ser 0.90 0.44 - 1.00 mg/dL   Calcium 9.7 8.9 - 10.3 mg/dL   Total Protein 7.9 6.5 - 8.1 g/dL   Albumin 3.9 3.5 - 5.0 g/dL   AST 25 15 - 41 U/L   ALT 29 0 - 44 U/L   Alkaline Phosphatase 83 38 - 126 U/L   Total Bilirubin 0.6 0.3 - 1.2 mg/dL   GFR calc non Af Amer >60 >60 mL/min   GFR calc Af Amer >60 >60 mL/min   Anion gap 13 5 - 15    Comment: Performed at [facility], [place] [address], [place], [place] [address]  CBG monitoring, ED     Status: Abnormal   Collection Time: 05/23/19  7:52 AM  Result Value Ref Range   Glucose-Capillary 103 (H) 70 - 99 mg/dL  I-stat chem 8, ED     Status: Abnormal   Collection Time: 05/23/19  8:11 AM  Result Value Ref Range   Sodium 140 135 - 145 mmol/L   Potassium 4.1 3.5 - 5.1 mmol/L   Chloride 105 98 - 111 mmol/L   BUN 8 6 - 20 mg/dL   Creatinine, Ser 0.70 0.44 - 1.00 mg/dL   Glucose, Bld 105 (H) 70 - 99 mg/dL   Calcium, Ion 1.19 1.15 - 1.40 mmol/L   TCO2 23 22 - 32  mmol/L   Hemoglobin 14.6 12.0 - 15.0 g/dL   HCT 43.0 36.0 - 46.0 %   SARS CORONAVIRUS 2 (TAT 6-24 HRS) Nasopharyngeal Nasopharyngeal Swab     Status: None   Collection Time: 05/23/19  3:30 PM   Specimen: Nasopharyngeal Swab  Result Value Ref Range   SARS Coronavirus 2 NEGATIVE NEGATIVE    Comment: (NOTE) SARS-CoV-2 target nucleic acids are NOT DETECTED. The SARS-CoV-2 RNA is generally detectable in upper and lower respiratory specimens during the acute phase of infection. Negative results do not preclude SARS-CoV-2 infection, do not rule out co-infections with other pathogens, and should not be used as the sole basis for treatment or other patient management decisions. Negative results must be combined with clinical observations, patient history, and epidemiological information. The expected result is Negative. Fact Sheet for Patients: SugarRoll.be Fact Sheet for Healthcare Providers: https://www.woods-mathews.com/ This test is not yet approved or cleared by the [place] FDA and  has been authorized for detection and/or diagnosis of SARS-CoV-2 by FDA under an Emergency Use Authorization (EUA). This EUA will remain  in effect (meaning this test can be used) for the duration of the COVID-19 declaration under Section 56 4(b)(1) of the Act, 21 U.S.C. section 360bbb-3(b)(1), unless the authorization is terminated or revoked sooner. Performed at [facility], [place] [address] [place], [place] [address]    Ct Head Wo Contrast  Result Date: 05/23/2019 CLINICAL DATA:  Left facial weakness EXAM: CT HEAD WITHOUT CONTRAST TECHNIQUE: Contiguous axial images were obtained from the base of the skull through the vertex without intravenous contrast. COMPARISON:  None. FINDINGS: Brain: Ventricles are normal in size and configuration. There is a degree of invagination of CSF into the sella. There is no intracranial mass, hemorrhage, extra-axial fluid collection, or midline shift. Brain parenchyma appears unremarkable.  No acute infarct. Vascular: No hyperdense vessels. No appreciable vascular calcification. Skull: Bony calvarium appears intact. Sinuses/Orbits: There is mild mucosal thickening involving several ethmoid air cells. Other paranasal sinuses are clear. Orbits appear symmetric bilaterally. Other: Mastoid air cells are clear. IMPRESSION: 1. Invagination of CSF into the sella, a finding of doubtful clinical significance. Ventricles normal in size and configuration. 2.  Brain parenchyma appears unremarkable.  No mass or hemorrhage. 3.  Mild mucosal thickening in several ethmoid air cells. Electronically Signed   By: Lowella Grip III M.D.   On: 05/23/2019 08:56   Mr Angio Head Wo Contrast  Result Date: 05/23/2019 CLINICAL DATA:  Cerebral aneurysm, subarachnoid hemorrhage, cerebral vasospasm, evaluate left-sided weakness, left facial droop. EXAM: MRI HEAD WITHOUT CONTRAST MRA HEAD WITHOUT CONTRAST TECHNIQUE: Multiplanar, multiecho pulse sequences of the brain and surrounding structures were obtained without intravenous contrast. Angiographic images of the head were obtained using MRA technique without contrast. COMPARISON:  Noncontrast head CT 05/23/2019 FINDINGS: MRI HEAD FINDINGS Brain: Multiple sequences are motion degraded. Acute infarct measuring 2.1 x 0.7 cm involving the right corona radiata/internal capsule and extending inferiorly into the right basal ganglia. Associated T2/FLAIR hyperintensity at this site. Mild scattered T2/FLAIR hyperintensity within the cerebral white matter is nonspecific, but consistent chronic small vessel ischemic disease. No evidence of intracranial mass. No midline shift or extra-axial fluid collection. No chronic intracranial blood products. Cerebral volume is normal for age. Partially empty sella turcica. Vascular: Reported separately Skull and upper cervical spine: Normal marrow signal. Sinuses/Orbits: Open orbits visualized paranasal sinuses are clear. Visualized mastoid air  cells are clear. MRA HEAD FINDINGS The examination is motion degraded. Bilateral ICA siphons patent  without high-grade stenosis. Right middle and anterior cerebral arteries patent without high-grade proximal stenosis. Left middle and anterior cerebral arteries patent without high-grade proximal stenosis. Possible 2 mm aneurysm arising from the A2 right anterior cerebral artery on source images (series 3, image 61). However, evaluation is somewhat limited due to motion artifact. Visualized intracranial vertebral arteries are patent without high-grade stenosis. Basilar artery is patent without high-grade stenosis. Bilateral posterior cerebral arteries patent without high-grade proximal stenosis. A right posterior communicating artery is present. Left posterior communicating is poorly delineated and may be hypoplastic or absent. These results were called by telephone at the time of interpretation on 05/23/2019 at 3:09 pm to provider Dr. Tyrone Nine, Who verbally acknowledged these results. IMPRESSION: MRI brain: 1. Motion degraded examination. 2. 2.1 x 0.7 cm acute infarct involving right corona radiata/internal capsule and extending into the right basal ganglia. 3. Mild chronic small vessel ischemic disease. MRA head: 1. Motion degraded examination. 2. No evidence of intracranial large vessel occlusion or high-grade proximal arterial stenosis. 3. A 2 mm aneurysm arising the A2 right anterior cerebral artery is questioned, although evaluation is somewhat limited due to motion degradation. Consider follow-up CT or MR angiography. Electronically Signed   By: Kellie Simmering   On: 05/23/2019 15:10   Mr Angio Neck W Wo Contrast  Result Date: 05/23/2019 CLINICAL DATA:  Initial evaluation for acute stroke. EXAM: MRA NECK WITHOUT AND WITH CONTRAST TECHNIQUE: Multiplanar and multiecho pulse sequences of the neck were obtained without and with intravenous contrast. Angiographic images of the neck were obtained using MRA technique  without and with intravenous contrast. CONTRAST:  42m GADAVIST GADOBUTROL 1 MMOL/ML IV SOLN COMPARISON:  Prior MRI and brain MRA from earlier the same day. FINDINGS: Examination mildly limited by motion artifact. Source images reviewed. AORTIC ARCH: Visualized aortic arch of normal caliber with normal branch pattern. No hemodynamically significant stenosis seen about the origin of the great vessels. Visualized subclavian arteries widely patent. RIGHT CAROTID SYSTEM: Evaluation of the proximal right CCA mildly limited by motion. Visualized right CCA patent to the bifurcation without stenosis. No significant atheromatous narrowing seen about the right bifurcation. Right ICA mildly tortuous but widely patent distally to the circle-of-Willis without stenosis or occlusion. LEFT CAROTID SYSTEM: Left CCA patent to the bifurcation without stenosis. Evaluation of the left bifurcation mildly limited by motion, with no definite significant stenosis seen. Left ICA mildly tortuous but widely patent distally to the circle-of-Willis without stenosis or occlusion. VERTEBRAL ARTERIES: Both vertebral arteries arise from the subclavian arteries. Vertebral arteries widely patent within the neck without stenosis or vascular occlusion. IMPRESSION: Negative MRA of the neck. No hemodynamically significant or critical flow limiting stenosis. Electronically Signed   By: BJeannine BogaM.D.   On: 05/23/2019 20:11   Mr Brain Wo Contrast (neuro Protocol)  Result Date: 05/23/2019 CLINICAL DATA:  Cerebral aneurysm, subarachnoid hemorrhage, cerebral vasospasm, evaluate left-sided weakness, left facial droop. EXAM: MRI HEAD WITHOUT CONTRAST MRA HEAD WITHOUT CONTRAST TECHNIQUE: Multiplanar, multiecho pulse sequences of the brain and surrounding structures were obtained without intravenous contrast. Angiographic images of the head were obtained using MRA technique without contrast. COMPARISON:  Noncontrast head CT 05/23/2019 FINDINGS: MRI  HEAD FINDINGS Brain: Multiple sequences are motion degraded. Acute infarct measuring 2.1 x 0.7 cm involving the right corona radiata/internal capsule and extending inferiorly into the right basal ganglia. Associated T2/FLAIR hyperintensity at this site. Mild scattered T2/FLAIR hyperintensity within the cerebral white matter is nonspecific, but consistent chronic small vessel ischemic disease. No evidence  of intracranial mass. No midline shift or extra-axial fluid collection. No chronic intracranial blood products. Cerebral volume is normal for age. Partially empty sella turcica. Vascular: Reported separately Skull and upper cervical spine: Normal marrow signal. Sinuses/Orbits: Open orbits visualized paranasal sinuses are clear. Visualized mastoid air cells are clear. MRA HEAD FINDINGS The examination is motion degraded. Bilateral ICA siphons patent without high-grade stenosis. Right middle and anterior cerebral arteries patent without high-grade proximal stenosis. Left middle and anterior cerebral arteries patent without high-grade proximal stenosis. Possible 2 mm aneurysm arising from the A2 right anterior cerebral artery on source images (series 3, image 61). However, evaluation is somewhat limited due to motion artifact. Visualized intracranial vertebral arteries are patent without high-grade stenosis. Basilar artery is patent without high-grade stenosis. Bilateral posterior cerebral arteries patent without high-grade proximal stenosis. A right posterior communicating artery is present. Left posterior communicating is poorly delineated and may be hypoplastic or absent. These results were called by telephone at the time of interpretation on 05/23/2019 at 3:09 pm to provider Dr. Tyrone Nine, Who verbally acknowledged these results. IMPRESSION: MRI brain: 1. Motion degraded examination. 2. 2.1 x 0.7 cm acute infarct involving right corona radiata/internal capsule and extending into the right basal ganglia. 3. Mild chronic  small vessel ischemic disease. MRA head: 1. Motion degraded examination. 2. No evidence of intracranial large vessel occlusion or high-grade proximal arterial stenosis. 3. A 2 mm aneurysm arising the A2 right anterior cerebral artery is questioned, although evaluation is somewhat limited due to motion degradation. Consider follow-up CT or MR angiography. Electronically Signed   By: Kellie Simmering   On: 05/23/2019 15:10   Dg Chest Port 1 View  Result Date: 05/23/2019 CLINICAL DATA:  Hemiparesis, shortness of breath EXAM: PORTABLE CHEST 1 VIEW COMPARISON:  None. FINDINGS: The heart size and mediastinal contours are within normal limits. Both lungs are clear. The visualized skeletal structures are unremarkable. IMPRESSION: No active disease. Electronically Signed   By: Zetta Bills M.D.   On: 05/23/2019 16:49    Pending Labs Unresulted Labs (From admission, onward)    Start     Ordered   05/24/19 0500  Hemoglobin A1c  Tomorrow morning,   R     05/23/19 1608   05/24/19 0500  Lipid panel  Tomorrow morning,   R    Comments: Fasting    05/23/19 1608   05/24/19 0500  CBC  Tomorrow morning,   R     05/23/19 1608   05/24/19 2637  Basic metabolic panel  Tomorrow morning,   R     05/23/19 1608   05/24/19 0500  HIV Antibody  (Routine Testing)  Tomorrow morning,   R     05/23/19 1642          Vitals/Pain Today's Vitals   05/23/19 1859 05/23/19 2009 05/23/19 2012 05/23/19 2019  BP:  136/90 136/90   Pulse:  82 84 85  Resp:  (!) 21 16 (!) 23  Temp:   98.3 F (36.8 C)   TempSrc:   Oral   SpO2:  100% 99% 98%  Weight:      Height:      PainSc: 0-No pain       Isolation Precautions No active isolations  Medications Medications  sodium chloride flush (NS) 0.9 % injection 3 mL (3 mLs Intravenous Not Given 05/23/19 0754)  zolpidem (AMBIEN) tablet 5 mg (has no administration in time range)  mesalamine (LIALDA) EC tablet 2.4 g (2.4 g Oral Not Given 05/23/19  2201)  pantoprazole (PROTONIX) EC  tablet 40 mg (has no administration in time range)  citalopram (CELEXA) tablet 20 mg (20 mg Oral Not Given 05/23/19 2013)  buPROPion (WELLBUTRIN XL) 24 hr tablet 300 mg (0 mg Oral Hold 05/23/19 2014)  acetaminophen (TYLENOL) tablet 650 mg (has no administration in time range)    Or  acetaminophen (TYLENOL) solution 650 mg (has no administration in time range)    Or  acetaminophen (TYLENOL) suppository 650 mg (has no administration in time range)  enoxaparin (LOVENOX) injection 40 mg (0 mg Subcutaneous Hold 05/23/19 2015)  aspirin suppository 300 mg (0 mg Rectal Hold 05/23/19 2015)    Or  aspirin tablet 325 mg ( Oral See Alternative 05/23/19 2015)  labetalol (NORMODYNE) injection 5 mg (has no administration in time range)  albuterol (PROVENTIL) (2.5 MG/3ML) 0.083% nebulizer solution 2.5 mg (has no administration in time range)  atorvastatin (LIPITOR) tablet 80 mg (80 mg Oral Given 05/23/19 2201)  LORazepam (ATIVAN) injection 1 mg (1 mg Intravenous Given 05/23/19 1343)   stroke: mapping our early stages of recovery book ( Does not apply Given 05/23/19 2103)  LORazepam (ATIVAN) injection 1 mg (1 mg Intravenous Given 05/23/19 1902)  gadobutrol (GADAVIST) 1 MMOL/ML injection 10 mL (10 mLs Intravenous Contrast Given 05/23/19 1945)    Mobility walks with person assist Low fall risk   Focused Assessments Neuro Assessment Handoff:  Swallow screen pass? Yes  Cardiac Rhythm: Normal sinus rhythm NIH Stroke Scale ( + Modified Stroke Scale Criteria)  Interval: Initial Level of Consciousness (1a.)   : Alert, keenly responsive LOC Questions (1b. )   +: Answers both questions correctly LOC Commands (1c. )   + : Performs both tasks correctly Best Gaze (2. )  +: Normal Visual (3. )  +: No visual loss Facial Palsy (4. )    : Minor paralysis Motor Arm, Left (5a. )   +: Drift Motor Arm, Right (5b. )   +: No drift Motor Leg, Left (6a. )   +: Drift Motor Leg, Right (6b. )   +: No drift Limb Ataxia (7. ):  Absent Sensory (8. )   +: Normal, no sensory loss Best Language (9. )   +: Mild-to-moderate aphasia Dysarthria (10. ): Normal Extinction/Inattention (11.)   +: No Abnormality Modified SS Total  +: 3 Complete NIHSS TOTAL: 3 Last date known well: 05/23/19 Last time known well: 2300 Neuro Assessment: Exceptions to WDL Neuro Checks:   Initial (05/23/19 0755)  Last Documented NIHSS Modified Score: 3 (05/23/19 2016) Has TPA been given? No If patient is a Neuro Trauma and patient is going to OR before floor call report to [place] nurse: [phone] or [phone]     R Recommendations: See Admitting Provider Note  Report given to:   Additional Notes: will not keep any monitors on and very rude to staff.

## 2019-05-23 NOTE — ED Triage Notes (Signed)
To ED for eval of left side weakness and left facial droop. Pt states she went to sleep last night about 11pm and woke this am at 0430 unable to walk well and 'knocking things over'. Grips and leg strength equal and strong. Noted left facial droop and left arm drift. Denies HA. Speech is clear.

## 2019-05-23 NOTE — Consult Note (Signed)
Neurology Consultation  Reason for Consult: Left-sided weakness Referring Physician: Dr. Tamala Julian  CC: Left-sided weakness  History is obtained from: Patient, chart  HPI: Carol Johnson is a 53 y.o. female past medical history of hypertension, depression, hypercholesterolemia, ulcerative colitis, irritable bowel, B12 deficiency, DVT in the past, not on anticoagulation, asthma and concern for pulmonary fibrosis, presented to emergency room for evaluation of left-sided weakness and left-sided facial droop. Her last known normal was 11 PM yesterday 05/22/2019 when she went to bed.  She woke up this morning to use the bathroom and noticed her left side to be weak and swerving to the left. She came to the hospital for further evaluation.  She was evaluated by the ED provider, deemed to be outside the window for IV TPA and in the absence of any LVO signs, admitted to the hospitalist service for a stroke work-up. Denies having had these kind of symptoms ever in the past.  Denies a current headache.  Denies visual symptoms.  Denies fevers chills.  Denies chest pain shortness of breath nausea vomiting.   LKW: 11 PM on 05/22/2019 tpa given?: no, outside the window Premorbid modified Rankin scale (mRS): 0  ROS:  ROS was performed and is negative except as noted in the HPI.   Past Medical History:  Diagnosis Date  . Abdominal pain, unspecified site   . Allergic rhinitis   . Anxiety   . Asthma   . Depression   . DVT (deep venous thrombosis) ([place]) 2016  . Fibroid uterus   . GERD (gastroesophageal reflux disease)   . Hypercholesterolemia   . Hypertension   . Internal hemorrhoid   . Irritable bowel syndrome   . Lymphocytic colitis 01/02/2015  . Pneumonia 1998  . Ulcerative colitis ([place])   . URI (upper respiratory infection)   . Uterine fibroid   . Vitamin B12 deficiency       Family History  Problem Relation Age of Onset  . Pulmonary fibrosis Father   . Colon cancer Paternal Aunt   .  Uterine cancer Paternal Grandmother        with mets to the colon   . Heart failure Maternal Grandmother   . Heart disease Paternal Uncle   . Heart disease Paternal Aunt   . Kidney disease Cousin   . Hyperlipidemia Mother   . Mitral valve prolapse Mother   . Heart disease Mother        arrhythmia    Social History:   reports that she quit smoking about 17 months ago. Her smoking use included cigarettes. She has a 1.00 pack-year smoking history. She has never used smokeless tobacco. She reports current alcohol use of about 5.0 standard drinks of alcohol per week. She reports that she does not use drugs.  Medications  Current Facility-Administered Medications:  .  acetaminophen (TYLENOL) tablet 650 mg, 650 mg, Oral, Q4H PRN **OR** acetaminophen (TYLENOL) solution 650 mg, 650 mg, Per Tube, Q4H PRN **OR** acetaminophen (TYLENOL) suppository 650 mg, 650 mg, Rectal, Q4H PRN, Smith, Rondell A, MD .  albuterol (PROVENTIL) (2.5 MG/3ML) 0.083% nebulizer solution 2.5 mg, 2.5 mg, Nebulization, Q6H PRN, Tamala Julian, Rondell A, MD .  aspirin suppository 300 mg, 300 mg, Rectal, Daily, Stopped at 05/23/19 2015 **OR** aspirin tablet 325 mg, 325 mg, Oral, Daily, Smith, Rondell A, MD .  atorvastatin (LIPITOR) tablet 80 mg, 80 mg, Oral, q1800, Fuller Plan A, MD, 80 mg at 05/23/19 2201 .  buPROPion (WELLBUTRIN XL) 24 hr tablet 300 mg, 300  mg, Oral, Daily, Norval Morton, MD, Stopped at 05/23/19 2014 .  citalopram (CELEXA) tablet 20 mg, 20 mg, Oral, Daily, Smith, Rondell A, MD .  enoxaparin (LOVENOX) injection 40 mg, 40 mg, Subcutaneous, Q24H, Norval Morton, MD, Stopped at 05/23/19 2015 .  labetalol (NORMODYNE) injection 5 mg, 5 mg, Intravenous, Q2H PRN, Smith, Rondell A, MD .  mesalamine (LIALDA) EC tablet 2.4 g, 2.4 g, Oral, BID, Smith, Rondell A, MD .  Derrill Memo ON 05/24/2019] pantoprazole (PROTONIX) EC tablet 40 mg, 40 mg, Oral, Daily, Smith, Rondell A, MD .  sodium chloride flush (NS) 0.9 % injection 3 mL,  3 mL, Intravenous, Once, Delo, Douglas, MD .  zolpidem (AMBIEN) tablet 5 mg, 5 mg, Oral, QHS PRN,MR X 1, Smith, Rondell A, MD  Current Outpatient Medications:  .  buPROPion (WELLBUTRIN XL) 300 MG 24 hr tablet, Take 1 tablet (300 mg total) by mouth daily., Disp: 90 tablet, Rfl: 3 .  citalopram (CELEXA) 20 MG tablet, Take 1 tablet (20 mg total) by mouth daily., Disp: 90 tablet, Rfl: 3 .  fexofenadine (ALLEGRA) 180 MG tablet, Take 180 mg by mouth daily as needed. , Disp: , Rfl:  .  hydrochlorothiazide (MICROZIDE) 12.5 MG capsule, Take 1 capsule (12.5 mg total) by mouth daily., Disp: 90 capsule, Rfl: 3 .  losartan (COZAAR) 25 MG tablet, Take 1 tablet (25 mg total) by mouth daily. Please make overdue appt with Dr. Saunders Revel before anymore refills. 2nd attempt (Patient taking differently: Take 25 mg by mouth daily. ), Disp: 15 tablet, Rfl: 0 .  meloxicam (MOBIC) 15 MG tablet, TAKE 1 TABLET (15 MG TOTAL) BY MOUTH DAILY AS NEEDED FOR PAIN WITH FOOD (Patient taking differently: Take 15 mg by mouth as needed for pain. ), Disp: 30 tablet, Rfl: 3 .  mesalamine (LIALDA) 1.2 g EC tablet, TAKE 2 TABLETS (2.4 G TOTAL) BY MOUTH 2 (TWO) TIMES DAILY., Disp: 60 tablet, Rfl: 2 .  pantoprazole (PROTONIX) 40 MG tablet, Take 1 tablet (40 mg total) by mouth daily., Disp: 90 tablet, Rfl: 3 .  zolpidem (AMBIEN CR) 12.5 MG CR tablet, TAKE 1 TABLET (12.5 MG TOTAL) BY MOUTH AT BEDTIME AS NEEDED FOR SLEEP (Patient taking differently: Take 2.25-12.5 mg by mouth at bedtime as needed for sleep. ), Disp: 30 tablet, Rfl: 3 .  albuterol (PROVENTIL HFA;VENTOLIN HFA) 108 (90 Base) MCG/ACT inhaler, Inhale 2 puffs into the lungs every 4 (four) hours as needed for wheezing or shortness of breath (cough, shortness of breath or wheezing.). (Patient not taking: Reported on 05/23/2019), Disp: 1 Inhaler, Rfl: 1 .  atorvastatin (LIPITOR) 10 MG tablet, Take 1 tablet (10 mg total) daily by mouth. (Patient not taking: Reported on 05/23/2019), Disp: 30 tablet,  Rfl: 11 .  fluticasone (FLONASE) 50 MCG/ACT nasal spray, Place 2 sprays into both nostrils daily. (Patient not taking: Reported on 05/23/2019), Disp: 16 g, Rfl: 6 .  metoprolol succinate (TOPROL-XL) 25 MG 24 hr tablet, Take 1 tablet (25 mg total) by mouth daily. Take with or immediately following a meal. (Patient not taking: Reported on 05/23/2019), Disp: 90 tablet, Rfl: 3 .  predniSONE (DELTASONE) 20 MG tablet, Take 1 tablet (20 mg total) by mouth daily with breakfast. (Patient not taking: Reported on 05/23/2019), Disp: 10 tablet, Rfl: 0   Exam: Current vital signs: BP 136/90 (BP Location: Left Arm)   Pulse 85   Temp 98.3 F (36.8 C) (Oral)   Resp (!) 23   Ht 5' 3"  (1.6 m)  Wt 104.3 kg   LMP 11/29/2015   SpO2 98%   BMI 40.74 kg/m  Vital signs in last 24 hours: Temp:  [98.3 F (36.8 C)-98.8 F (37.1 C)] 98.3 F (36.8 C) (09/23 2012) Pulse Rate:  [80-89] 85 (09/23 2019) Resp:  [15-28] 23 (09/23 2019) BP: (111-161)/(53-93) 136/90 (09/23 2012) SpO2:  [96 %-100 %] 98 % (09/23 2019) Weight:  [104.3 kg] 104.3 kg (09/23 0849) General: Awake alert in no distress HEENT: Normocephalic atraumatic dry oral mucous membranes Lungs: Seems to be slightly dyspneic but says that that is her norm. Cardiovascular system: Regular rate rhythm Abdomen: Nondistended nontender Extremities warm well perfused with no edema Neurological exam Awake alert oriented x3 Speech is not dysarthric Naming comprehension repetition intact Cranial nerves: Pupils equal round react light, extraocular movements intact, visual fields full, subtle left lower facial weakness noted, auditory acuity intact, palate midline, tongue midline. Motor exam: Vertical drift in both left upper and lower extremity with 4/5 strength.  Right side full-strength 5/5. Sensory exam: Intact to light touch with no extinction. Coordination: Intact finger-nose-finger. Gait testing deferred at this time NIH stroke scale-3- 1 for left arm, 1  for left leg, 1 for left facial.  CBC    Component Value Date/Time   WBC 9.6 05/23/2019 0750   RBC 5.14 (H) 05/23/2019 0750   HGB 14.6 05/23/2019 0811   HCT 43.0 05/23/2019 0811   PLT 469 (H) 05/23/2019 0750   MCV 82.1 05/23/2019 0750   MCH 25.1 (L) 05/23/2019 0750   MCHC 30.6 05/23/2019 0750   RDW 15.1 05/23/2019 0750   LYMPHSABS 2.6 05/23/2019 0750   MONOABS 0.6 05/23/2019 0750   EOSABS 0.4 05/23/2019 0750   BASOSABS 0.1 05/23/2019 0750    CMP     Component Value Date/Time   NA 140 05/23/2019 0811   NA 139 02/09/2018 1146   K 4.1 05/23/2019 0811   CL 105 05/23/2019 0811   CO2 21 (L) 05/23/2019 0750   GLUCOSE 105 (H) 05/23/2019 0811   BUN 8 05/23/2019 0811   BUN 9 02/09/2018 1146   CREATININE 0.70 05/23/2019 0811   CALCIUM 9.7 05/23/2019 0750   PROT 7.9 05/23/2019 0750   ALBUMIN 3.9 05/23/2019 0750   AST 25 05/23/2019 0750   ALT 29 05/23/2019 0750   ALKPHOS 83 05/23/2019 0750   BILITOT 0.6 05/23/2019 0750   GFRNONAA >60 05/23/2019 0750   GFRAA >60 05/23/2019 0750    Lipid Panel     Component Value Date/Time   CHOL 211 (H) 04/06/2019 0945   TRIG 116.0 04/06/2019 0945   HDL 42.70 04/06/2019 0945   CHOLHDL 5 04/06/2019 0945   VLDL 23.2 04/06/2019 0945   LDLCALC 145 (H) 04/06/2019 0945   LDLDIRECT 193.0 08/04/2017 0938     Imaging I have reviewed the images obtained: CT head with no acute findings. MRI brain with a 2 cm acute infarct in the right corona radiata/internal capsule extending into the right basal ganglia. MRA head with no LVO but concerning for possible 2 mm aneurysm from the right A2 division of the anterior cerebral artery.  Somewhat limited due to motion. MRA neck unremarkable for any acute occlusion.   Assessment: 53 year old with sudden onset of left-sided weakness and left-sided facial droop presenting for evaluation in the emergency room. MRI brain with the acute infarct in the right corona radiata/internal capsule/basal ganglia-likely  secondary to small vessel etiology-has risk factors such as hypertension hyperlipidemia. Will need admission for stroke work-up and PT OT  Impression: Acute ischemic stroke likely small vessel etiology  Recommendations: Admit to hospitalist Telemetry Brain imaging and vessel imaging has been completed 2D echocardiogram Hemoglobin A1c Lipid panel was done in August- no need to repeat.  LDL 145.  Goal LDL should be less than 70. Aspirin 325 Atorvastatin 80 Physical therapy Occupational Therapy Speech therapy N.p.o. until cleared by bedside swallow evaluation Allow for permissive hypertension-for the first 24 to 48 hours to treat only if systolic blood pressures greater than 220. Blood pressures to be gradually normalized upon discharge with goal systolic less than 161.  Please page stroke NP/PA/MD (listed on AMION)  from 8am-4 pm as this patient will be followed by the stroke team at this point.  -- Amie Portland, MD [facility] Pager: [phone] If 7pm to 7am, please call on call as listed on AMION.

## 2019-05-23 NOTE — H&P (Signed)
History and Physical    Carol Johnson KGM:[id number] DOB: [birth year] DOA: 05/23/2019  Referring MD/NP/PA: Carmin Muskrat, MD PCP: Abner Greenspan, MD  Patient coming from: Home  Chief Complaint: Left-sided weakness  I have personally briefly reviewed patient's old medical records in [place]   HPI: Carol Johnson is a 53 y.o. right-handed female with medical history significant of  HTN, HLD, DVT, ulcerative colitis, and Jerrye Bushy; who presents with complaints of left-sided weakness.  Patient reports being in her normal state of health until around 11 PM last night when she first noted some left-sided weakness.  On try to go to the bathroom she actually fell but did not hit her head or lose consciousness.  She attributed the symptoms initially to being more tired than usual.  However when she awoke at 4:30 AM this morning she still was having some difficulty in ambulating to the bathroom.  She was dragging her left arm and leg.  Associated symptoms included plaints of a headache.  Patient chronically has intermittent shortness of breath, nausea, and stools with blood due to her ulcerative colitis.  Denies having any change in speech, chest pain, fever, chills, vomiting, dysuria, or abdominal pain.  Patient had been on atorvastatin previously in the past but was stopped when she was being evaluated for inflammation of her lungs that spontaneously resolved.  Currently being followed by [facility] for this.    ED Course: Upon admission into the emergency department patient was noted to be afebrile with respirations 15 and 28, and all other vital signs maintained.  Labs relatively unremarkable except for platelet count of 469.  CT scan of the brain did not show acute hemorrhage or stroke.  Neurology was consulted. Patient was out of the window for TPA.  MRI/MRA of the head revealed 2.1 x 0.7 cm acute infarct involving right corona radiata/internal capsule with extension into the right basal  ganglia, and a possible  45m A2 right anterior cerebral artery aneurysm. TRH consulted to admit.  Review of Systems  Constitutional: Negative for chills and fever.  HENT: Negative for congestion and sinus pain.   Eyes: Negative for double vision and photophobia.  Respiratory: Positive for shortness of breath.   Cardiovascular: Negative for chest pain and leg swelling.  Gastrointestinal: Positive for blood in stool and nausea. Negative for vomiting.  Musculoskeletal: Positive for falls and myalgias.  Skin: Negative for itching and rash.  Neurological: Positive for focal weakness. Negative for speech change and loss of consciousness.  Psychiatric/Behavioral: The patient is nervous/anxious.     Past Medical History:  Diagnosis Date   Abdominal pain, unspecified site    Allergic rhinitis    Anxiety    Asthma    Depression    DVT [place] venous thrombosis) [place] [place]   Fibroid uterus    GERD (gastroesophageal reflux disease)    Hypercholesterolemia    Hypertension    Internal hemorrhoid    Irritable bowel syndrome    Lymphocytic colitis 01/02/2015   Pneumonia 1998   Ulcerative colitis ([facility]    URI (upper respiratory infection)    Uterine fibroid    Vitamin B12 deficiency     Past Surgical History:  Procedure Laterality Date   CESAREAN SECTION  2004   CHOLECYSTECTOMY     laparoscopic "gallstones"   ESOPHAGOGASTRODUODENOSCOPY (EGD) WITH PROPOFOL N/A 05/03/2014   Procedure: ESOPHAGOGASTRODUODENOSCOPY (EGD) WITH PROPOFOL;  Surgeon: RInda Castle MD;  Location: [facility];  Service: Endoscopy;  Laterality: N/A;  MYOMECTOMY     UTERINE FIBROID SURGERY       reports that she quit smoking about 17 months ago. Her smoking use included cigarettes. She has a 1.00 pack-year smoking history. She has never used smokeless tobacco. She reports current alcohol use of about 5.0 standard drinks of alcohol per week. She reports that she does not use drugs.  Allergies    Allergen Reactions   Sertraline Hcl     REACTION: diarrhea   Zoloft [Sertraline Hcl] Diarrhea    Family History  Problem Relation Age of Onset   Pulmonary fibrosis Father    Colon cancer Paternal Aunt    Uterine cancer Paternal Grandmother        with mets to the colon    Heart failure Maternal Grandmother    Heart disease Paternal Uncle    Heart disease Paternal Aunt    Kidney disease Cousin    Hyperlipidemia Mother    Mitral valve prolapse Mother    Heart disease Mother        arrhythmia    Prior to Admission medications   Medication Sig Start Date End Date Taking? Authorizing Provider  buPROPion (WELLBUTRIN XL) 300 MG 24 hr tablet Take 1 tablet (300 mg total) by mouth daily. 04/06/19  Yes Tower, Wynelle Fanny, MD  citalopram (CELEXA) 20 MG tablet Take 1 tablet (20 mg total) by mouth daily. 04/06/19  Yes Tower, Wynelle Fanny, MD  fexofenadine (ALLEGRA) 180 MG tablet Take 180 mg by mouth daily as needed.    Yes [provider]  hydrochlorothiazide (MICROZIDE) 12.5 MG capsule Take 1 capsule (12.5 mg total) by mouth daily. 04/06/19  Yes Tower, Wynelle Fanny, MD  losartan (COZAAR) 25 MG tablet Take 1 tablet (25 mg total) by mouth daily. Please make overdue appt with Dr. Saunders Revel before anymore refills. 2nd attempt Patient taking differently: Take 25 mg by mouth daily.  05/08/19  Yes End, Harrell Gave, MD  meloxicam (MOBIC) 15 MG tablet TAKE 1 TABLET (15 MG TOTAL) BY MOUTH DAILY AS NEEDED FOR PAIN WITH FOOD Patient taking differently: Take 15 mg by mouth as needed for pain.  10/11/18  Yes Tower, Wynelle Fanny, MD  mesalamine (LIALDA) 1.2 g EC tablet TAKE 2 TABLETS (2.4 G TOTAL) BY MOUTH 2 (TWO) TIMES DAILY. 03/08/19  Yes Nandigam, Kavitha V, MD  pantoprazole (PROTONIX) 40 MG tablet Take 1 tablet (40 mg total) by mouth daily. 04/06/19  Yes Tower, Wynelle Fanny, MD  zolpidem (AMBIEN CR) 12.5 MG CR tablet TAKE 1 TABLET (12.5 MG TOTAL) BY MOUTH AT BEDTIME AS NEEDED FOR SLEEP Patient taking differently: Take  2.25-12.5 mg by mouth at bedtime as needed for sleep.  05/03/19  Yes Tower, Wynelle Fanny, MD  albuterol (PROVENTIL HFA;VENTOLIN HFA) 108 (90 Base) MCG/ACT inhaler Inhale 2 puffs into the lungs every 4 (four) hours as needed for wheezing or shortness of breath (cough, shortness of breath or wheezing.). Patient not taking: Reported on 05/23/2019 08/02/18   Elby Beck, FNP  atorvastatin (LIPITOR) 10 MG tablet Take 1 tablet (10 mg total) daily by mouth. Patient not taking: Reported on 05/23/2019 07/06/17   Tower, Wynelle Fanny, MD  fluticasone [facility]) 50 MCG/ACT nasal spray Place 2 sprays into both nostrils daily. Patient not taking: Reported on 05/23/2019 05/05/16   Elby Beck, FNP  metoprolol succinate (TOPROL-XL) 25 MG 24 hr tablet Take 1 tablet (25 mg total) by mouth daily. Take with or immediately following a meal. Patient not taking: Reported on 05/23/2019 02/09/18  End, Harrell Gave, MD  predniSONE (DELTASONE) 20 MG tablet Take 1 tablet (20 mg total) by mouth daily with breakfast. Patient not taking: Reported on 05/23/2019 04/09/19   Owens Loffler, MD    Physical Exam:  Constitutional: Middle-aged female NAD, calm, comfortable Vitals:   05/23/19 0930 05/23/19 0945 05/23/19 1000 05/23/19 1230  BP: 111/81 118/74 116/80 114/78  Pulse:    80  Resp: 15 17 19  (!) 25  Temp:      TempSrc:      SpO2:    100%  Weight:      Height:       Eyes: PERRL, lids and conjunctivae normal ENMT: Mucous membranes are dry. Posterior pharynx clear of any exudate or lesions.  Neck: normal, supple, no masses, no thyromegaly Respiratory: clear to auscultation bilaterally, no wheezing, no crackles. Normal respiratory effort. No accessory muscle use.  Cardiovascular: Regular rate and rhythm, no murmurs / rubs / gallops. No extremity edema. 2+ pedal pulses. No carotid bruits.  Abdomen: no tenderness, no masses palpated. No hepatosplenomegaly. Bowel sounds positive.  Musculoskeletal: no clubbing / cyanosis. No  joint deformity upper and lower extremities. Good ROM, no contractures. Normal muscle tone.  Skin: no rashes, lesions, ulcers. No induration Neurologic: CN 2-12 grossly intact.  Facial droop present on the left.  Strength 5/5 on right upper and lower extremity.  Strength 4+/5 on the left upper and lower extremity. Psychiatric: Normal judgment and insight. Alert and oriented x 3. Normal mood.     Labs on Admission: I have personally reviewed following labs and imaging studies  CBC: Recent Labs  Lab 05/23/19 0750 05/23/19 0811  WBC 9.6  --   NEUTROABS 6.0  --   HGB 12.9 14.6  HCT 42.2 43.0  MCV 82.1  --   PLT 469*  --    Basic Metabolic Panel: Recent Labs  Lab 05/23/19 0750 05/23/19 0811  NA 138 140  K 4.0 4.1  CL 104 105  CO2 21*  --   GLUCOSE 109* 105*  BUN 8 8  CREATININE 0.90 0.70  CALCIUM 9.7  --    GFR: Estimated Creatinine Clearance: 92.9 mL/min (by C-G formula based on SCr of 0.7 mg/dL). Liver Function Tests: Recent Labs  Lab 05/23/19 0750  AST 25  ALT 29  ALKPHOS 83  BILITOT 0.6  PROT 7.9  ALBUMIN 3.9   No results for input(s): LIPASE, AMYLASE in the last 168 hours. No results for input(s): AMMONIA in the last 168 hours. Coagulation Profile: Recent Labs  Lab 05/23/19 0750  INR 0.9   Cardiac Enzymes: No results for input(s): CKTOTAL, CKMB, CKMBINDEX, TROPONINI in the last 168 hours. BNP (last 3 results) No results for input(s): PROBNP in the last 8760 hours. HbA1C: No results for input(s): HGBA1C in the last 72 hours. CBG: Recent Labs  Lab 05/23/19 0752  GLUCAP 103*   Lipid Profile: No results for input(s): CHOL, HDL, LDLCALC, TRIG, CHOLHDL, LDLDIRECT in the last 72 hours. Thyroid Function Tests: No results for input(s): TSH, T4TOTAL, FREET4, T3FREE, THYROIDAB in the last 72 hours. Anemia Panel: No results for input(s): VITAMINB12, FOLATE, FERRITIN, TIBC, IRON, RETICCTPCT in the last 72 hours. Urine analysis:    Component Value  Date/Time   COLORURINE YELLOW 03/25/2007 [place] 03/25/2007 1053   LABSPEC 1.026 03/25/2007 [place] 6.0 03/25/2007 1053   GLUCOSEU NEGATIVE 03/25/2007 [place] 03/25/2007 1053   BILIRUBINUR neg 12/21/2017 [place] 03/25/2007  [place] neg 12/21/2017 1735   PROTEINUR NEGATIVE 03/25/2007 1053   UROBILINOGEN 0.2 12/21/2017 1735   UROBILINOGEN 0.2 03/25/2007 1053   NITRITE neg 12/21/2017 1735   NITRITE NEGATIVE 03/25/2007 1053   LEUKOCYTESUR Negative 12/21/2017 1735   Sepsis Labs: No results found for this or any previous visit (from the past 240 hour(s)).   Radiological Exams on Admission: Ct Head Wo Contrast  Result Date: 05/23/2019 CLINICAL DATA:  Left facial weakness EXAM: CT HEAD WITHOUT CONTRAST TECHNIQUE: Contiguous axial images were obtained from the base of the skull through the vertex without intravenous contrast. COMPARISON:  None. FINDINGS: Brain: Ventricles are normal in size and configuration. There is a degree of invagination of CSF into the sella. There is no intracranial mass, hemorrhage, extra-axial fluid collection, or midline shift. Brain parenchyma appears unremarkable. No acute infarct. Vascular: No hyperdense vessels. No appreciable vascular calcification. Skull: Bony calvarium appears intact. Sinuses/Orbits: There is mild mucosal thickening involving several ethmoid air cells. Other paranasal sinuses are clear. Orbits appear symmetric bilaterally. Other: Mastoid air cells are clear. IMPRESSION: 1. Invagination of CSF into the sella, a finding of doubtful clinical significance. Ventricles normal in size and configuration. 2.  Brain parenchyma appears unremarkable.  No mass or hemorrhage. 3.  Mild mucosal thickening in several ethmoid air cells. Electronically Signed   By: Lowella Grip III M.D.   On: 05/23/2019 08:56   Mr Angio Head Wo Contrast  Result Date: 05/23/2019 CLINICAL DATA:  Cerebral aneurysm,  subarachnoid hemorrhage, cerebral vasospasm, evaluate left-sided weakness, left facial droop. EXAM: MRI HEAD WITHOUT CONTRAST MRA HEAD WITHOUT CONTRAST TECHNIQUE: Multiplanar, multiecho pulse sequences of the brain and surrounding structures were obtained without intravenous contrast. Angiographic images of the head were obtained using MRA technique without contrast. COMPARISON:  Noncontrast head CT 05/23/2019 FINDINGS: MRI HEAD FINDINGS Brain: Multiple sequences are motion degraded. Acute infarct measuring 2.1 x 0.7 cm involving the right corona radiata/internal capsule and extending inferiorly into the right basal ganglia. Associated T2/FLAIR hyperintensity at this site. Mild scattered T2/FLAIR hyperintensity within the cerebral white matter is nonspecific, but consistent chronic small vessel ischemic disease. No evidence of intracranial mass. No midline shift or extra-axial fluid collection. No chronic intracranial blood products. Cerebral volume is normal for age. Partially empty sella turcica. Vascular: Reported separately Skull and upper cervical spine: Normal marrow signal. Sinuses/Orbits: Open orbits visualized paranasal sinuses are clear. Visualized mastoid air cells are clear. MRA HEAD FINDINGS The examination is motion degraded. Bilateral ICA siphons patent without high-grade stenosis. Right middle and anterior cerebral arteries patent without high-grade proximal stenosis. Left middle and anterior cerebral arteries patent without high-grade proximal stenosis. Possible 2 mm aneurysm arising from the A2 right anterior cerebral artery on source images (series 3, image 61). However, evaluation is somewhat limited due to motion artifact. Visualized intracranial vertebral arteries are patent without high-grade stenosis. Basilar artery is patent without high-grade stenosis. Bilateral posterior cerebral arteries patent without high-grade proximal stenosis. A right posterior communicating artery is present. Left  posterior communicating is poorly delineated and may be hypoplastic or absent. These results were called by telephone at the time of interpretation on 05/23/2019 at 3:09 pm to provider Dr. Tyrone Nine, Who verbally acknowledged these results. IMPRESSION: MRI brain: 1. Motion degraded examination. 2. 2.1 x 0.7 cm acute infarct involving right corona radiata/internal capsule and extending into the right basal ganglia. 3. Mild chronic small vessel ischemic disease. MRA head: 1. Motion degraded examination. 2. No evidence of intracranial large vessel occlusion  or high-grade proximal arterial stenosis. 3. A 2 mm aneurysm arising the A2 right anterior cerebral artery is questioned, although evaluation is somewhat limited due to motion degradation. Consider follow-up CT or MR angiography. Electronically Signed   By: Kellie Simmering   On: 05/23/2019 15:10   Mr Brain Wo Contrast (neuro Protocol)  Result Date: 05/23/2019 CLINICAL DATA:  Cerebral aneurysm, subarachnoid hemorrhage, cerebral vasospasm, evaluate left-sided weakness, left facial droop. EXAM: MRI HEAD WITHOUT CONTRAST MRA HEAD WITHOUT CONTRAST TECHNIQUE: Multiplanar, multiecho pulse sequences of the brain and surrounding structures were obtained without intravenous contrast. Angiographic images of the head were obtained using MRA technique without contrast. COMPARISON:  Noncontrast head CT 05/23/2019 FINDINGS: MRI HEAD FINDINGS Brain: Multiple sequences are motion degraded. Acute infarct measuring 2.1 x 0.7 cm involving the right corona radiata/internal capsule and extending inferiorly into the right basal ganglia. Associated T2/FLAIR hyperintensity at this site. Mild scattered T2/FLAIR hyperintensity within the cerebral white matter is nonspecific, but consistent chronic small vessel ischemic disease. No evidence of intracranial mass. No midline shift or extra-axial fluid collection. No chronic intracranial blood products. Cerebral volume is normal for age. Partially  empty sella turcica. Vascular: Reported separately Skull and upper cervical spine: Normal marrow signal. Sinuses/Orbits: Open orbits visualized paranasal sinuses are clear. Visualized mastoid air cells are clear. MRA HEAD FINDINGS The examination is motion degraded. Bilateral ICA siphons patent without high-grade stenosis. Right middle and anterior cerebral arteries patent without high-grade proximal stenosis. Left middle and anterior cerebral arteries patent without high-grade proximal stenosis. Possible 2 mm aneurysm arising from the A2 right anterior cerebral artery on source images (series 3, image 61). However, evaluation is somewhat limited due to motion artifact. Visualized intracranial vertebral arteries are patent without high-grade stenosis. Basilar artery is patent without high-grade stenosis. Bilateral posterior cerebral arteries patent without high-grade proximal stenosis. A right posterior communicating artery is present. Left posterior communicating is poorly delineated and may be hypoplastic or absent. These results were called by telephone at the time of interpretation on 05/23/2019 at 3:09 pm to provider Dr. Tyrone Nine, Who verbally acknowledged these results. IMPRESSION: MRI brain: 1. Motion degraded examination. 2. 2.1 x 0.7 cm acute infarct involving right corona radiata/internal capsule and extending into the right basal ganglia. 3. Mild chronic small vessel ischemic disease. MRA head: 1. Motion degraded examination. 2. No evidence of intracranial large vessel occlusion or high-grade proximal arterial stenosis. 3. A 2 mm aneurysm arising the A2 right anterior cerebral artery is questioned, although evaluation is somewhat limited due to motion degradation. Consider follow-up CT or MR angiography. Electronically Signed   By: Kellie Simmering   On: 05/23/2019 15:10    EKG: Independently reviewed.  Sinus rhythm 83 bpm  Assessment/Plan Acute CVA: Patient presents with left weakness starting possibly  last night around 11 PM.  Patient out of the window for TPA.  MRI/MRA revealing a 2.1 x 0.7 cm infarct of the right corona radiata/internal capsule with extension into the basal ganglia. - Admit to telemetry bed - Stroke order set initiated - Neuro checks - Follow-up MRA of the neck - PT/OT/Speech to eval and treat - Check echocardiogram - Check Hemoglobin A1c and lipid panel in a.m. - ASA - Appreciate neurology consultative services, will follow-up  - Social work consult    Ulcerative colitis: Patient currently reports no complaints of flare, but does report intermittent blood in stools. - Continue mesalamine  Essential hypertension: Blood pressures initially elevated up to 161/86. - Allowing for permissive hypertension 220/110 -  Restart hydrochlorothiazide and losartan  Thrombocytosis: Acute.  Platelet count mildly elevated at 469.  Suspect reactive in nature - Recheck CBC in a.m.  Hyperlipidemia: Patient previously had been on atorvastatin, but had been stopped due to reports of lung inflammation.  Total cholesterol 211, LDL 145, triglycerides 116, and HDL 42.7 on 04/06/2019, - Follow-up hemoglobin A1c - Start atorvastatin  Depression with anxiety - Continue Wellbutrin and Celexa  Morbid obesity: BMI of 40.74 kg/m   DVT prophylaxis: Lovenox  Code Status: full  Family Communication: Patient declined need to update family Disposition Plan: TBD  Consults called: neurology Admission status: Inpatient   Norval Morton MD [facility] Pager [phone]   If 7PM-7AM, please contact night-coverage [email or web address] Password [facility]  05/23/2019, 3:26 PM

## 2019-05-23 NOTE — Progress Notes (Signed)
Pt had MRI brain and MRA head.  She refused to continue with any further imaging therefore unable to obtain MRA of neck.

## 2019-05-23 NOTE — ED Notes (Signed)
Will not place bp cuff or monitors on her she rips them off

## 2019-05-23 NOTE — ED Notes (Signed)
Patient transported to MRI 

## 2019-05-23 NOTE — ED Notes (Signed)
Admitting provider at bedside.

## 2019-05-23 NOTE — ED Notes (Signed)
Pt now has L leg drift, and no longer has L arm drift.

## 2019-05-23 NOTE — ED Provider Notes (Signed)
[facility] EMERGENCY DEPARTMENT Provider Note   CSN: [id number] Arrival date & time: 05/23/19  5364     History   Chief Complaint Chief Complaint  Patient presents with   Weakness    HPI Carol Johnson is a 53 y.o. female.     HPI Patient presents with concern of weakness. Patient went to sleep yesterday about 8 hours prior to ED arrival in her usual state of health aside from feeling more tired than usual. She does have multiple medical issues including ulcerative colitis, prior DVT. She does not take blood thinning medication currently. Today she awoke with sense of discoordination in her left arm and leg, heaviness as well. She denies any speech changes, confusion, vision loss, absence of sensation in her left-sided extremities. However, with difficulty walking, lack of cooperativity from her left-sided extremities, she presents for evaluation.  Past Medical History:  Diagnosis Date   Abdominal pain, unspecified site    Allergic rhinitis    Anxiety    Asthma    Depression    DVT (deep venous thrombosis) ([place]) 2016   Fibroid uterus    GERD (gastroesophageal reflux disease)    Hypercholesterolemia    Hypertension    Internal hemorrhoid    Irritable bowel syndrome    Lymphocytic colitis 01/02/2015   Pneumonia 1998   Ulcerative colitis ([place])    URI (upper respiratory infection)    Uterine fibroid    Vitamin B12 deficiency     Patient Active Problem List   Diagnosis Date Noted   Coronary artery disease involving native coronary artery of native heart without angina pectoris 03/21/2018   Coronary artery calcification 02/10/2018   Morbid obesity ([place]) 02/10/2018   Smoker 01/15/2018   Dyspnea on exertion 12/21/2017   Carpal tunnel syndrome 12/21/2017   Stress incontinence 12/21/2017   Paresthesia of both feet 07/04/2017   Always thirsty 07/04/2017   Abdominal bloating 12/01/2015   Fatigue 12/01/2015   Vitamin  B12 deficiency 12/01/2015   Lymphocytic colitis 01/16/2015   Anemia, iron deficiency 01/16/2015   Anxiety state 12/20/2014   Diarrhea 11/27/2014   Insomnia 11/27/2014   History of DVT (deep vein thrombosis) 09/17/2014   OSA (obstructive sleep apnea) 09/02/2014   Gastritis, chronic 06/06/2014   Unspecified gastritis and gastroduodenitis without mention of hemorrhage 05/03/2014   Abdominal pain, epigastric 04/19/2014   Internal hemorrhoids with other complication 68/10/2120   Routine general medical examination at a health care facility 10/14/2011   Thyroid nodule 05/16/2011   Rectal bleeding 11/24/2010   Hemorrhoids, internal 11/24/2010   Irritable bowel syndrome (IBS) 11/24/2010   ANEMIA 10/02/2009   Hyperlipidemia 07/18/2009   Essential hypertension 07/18/2009   DYSPNEA ON EXERTION 07/18/2009   THYROID CYST 01/31/2008   ANXIETY 01/31/2008   Depression with anxiety 01/31/2008   GASTRIC ULCER, HX OF 01/31/2008   FIBROIDS, UTERUS 11/13/2007   ALLERGIC RHINITIS 11/13/2007   ASTHMA 11/13/2007   GERD 11/13/2007   IRRITABLE BOWEL SYNDROME 11/13/2007   INTERNAL HEMORRHOIDS 11/12/2003    Past Surgical History:  Procedure Laterality Date   CESAREAN SECTION  2004   CHOLECYSTECTOMY     laparoscopic "gallstones"   ESOPHAGOGASTRODUODENOSCOPY (EGD) WITH PROPOFOL N/A 05/03/2014   Procedure: ESOPHAGOGASTRODUODENOSCOPY (EGD) WITH PROPOFOL;  Surgeon: Inda Castle, MD;  Location: [facility];  Service: Endoscopy;  Laterality: N/A;   MYOMECTOMY     UTERINE FIBROID SURGERY       OB History   No obstetric history on file.  Home Medications    Prior to Admission medications   Medication Sig Start Date End Date Taking? Authorizing Provider  buPROPion (WELLBUTRIN XL) 300 MG 24 hr tablet Take 1 tablet (300 mg total) by mouth daily. 04/06/19  Yes Tower, Wynelle Fanny, MD  citalopram (CELEXA) 20 MG tablet Take 1 tablet (20 mg total) by mouth daily. 04/06/19   Yes Tower, Wynelle Fanny, MD  fexofenadine (ALLEGRA) 180 MG tablet Take 180 mg by mouth daily as needed.    Yes [provider]  hydrochlorothiazide (MICROZIDE) 12.5 MG capsule Take 1 capsule (12.5 mg total) by mouth daily. 04/06/19  Yes Tower, Wynelle Fanny, MD  losartan (COZAAR) 25 MG tablet Take 1 tablet (25 mg total) by mouth daily. Please make overdue appt with Dr. Saunders Revel before anymore refills. 2nd attempt Patient taking differently: Take 25 mg by mouth daily.  05/08/19  Yes End, Harrell Gave, MD  meloxicam (MOBIC) 15 MG tablet TAKE 1 TABLET (15 MG TOTAL) BY MOUTH DAILY AS NEEDED FOR PAIN WITH FOOD Patient taking differently: Take 15 mg by mouth as needed for pain.  10/11/18  Yes Tower, Wynelle Fanny, MD  mesalamine (LIALDA) 1.2 g EC tablet TAKE 2 TABLETS (2.4 G TOTAL) BY MOUTH 2 (TWO) TIMES DAILY. 03/08/19  Yes Nandigam, Kavitha V, MD  pantoprazole (PROTONIX) 40 MG tablet Take 1 tablet (40 mg total) by mouth daily. 04/06/19  Yes Tower, Wynelle Fanny, MD  zolpidem (AMBIEN CR) 12.5 MG CR tablet TAKE 1 TABLET (12.5 MG TOTAL) BY MOUTH AT BEDTIME AS NEEDED FOR SLEEP Patient taking differently: Take 2.25-12.5 mg by mouth at bedtime as needed for sleep.  05/03/19  Yes Tower, Wynelle Fanny, MD  albuterol (PROVENTIL HFA;VENTOLIN HFA) 108 (90 Base) MCG/ACT inhaler Inhale 2 puffs into the lungs every 4 (four) hours as needed for wheezing or shortness of breath (cough, shortness of breath or wheezing.). Patient not taking: Reported on 05/23/2019 08/02/18   Elby Beck, FNP  atorvastatin (LIPITOR) 10 MG tablet Take 1 tablet (10 mg total) daily by mouth. Patient not taking: Reported on 05/23/2019 07/06/17   Tower, Wynelle Fanny, MD  fluticasone [facility]) 50 MCG/ACT nasal spray Place 2 sprays into both nostrils daily. Patient not taking: Reported on 05/23/2019 05/05/16   Elby Beck, FNP  metoprolol succinate (TOPROL-XL) 25 MG 24 hr tablet Take 1 tablet (25 mg total) by mouth daily. Take with or immediately following a meal. Patient not  taking: Reported on 05/23/2019 02/09/18   End, Harrell Gave, MD  predniSONE (DELTASONE) 20 MG tablet Take 1 tablet (20 mg total) by mouth daily with breakfast. Patient not taking: Reported on 05/23/2019 04/09/19   Copland, Frederico Hamman, MD    Family History Family History  Problem Relation Age of Onset   Pulmonary fibrosis Father    Colon cancer Paternal Aunt    Uterine cancer Paternal Grandmother        with mets to the colon    Heart failure Maternal Grandmother    Heart disease Paternal Uncle    Heart disease Paternal Aunt    Kidney disease Cousin    Hyperlipidemia Mother    Mitral valve prolapse Mother    Heart disease Mother        arrhythmia    Social History Social History   Tobacco Use   Smoking status: Former Smoker    Packs/day: 0.10    Years: 10.00    Pack years: 1.00    Types: Cigarettes    Quit date: 12/07/2017  Years since quitting: 1.4   Smokeless tobacco: Never Used  Substance Use Topics   Alcohol use: Yes    Alcohol/week: 5.0 standard drinks    Types: 5 Cans of beer per week   Drug use: No     Allergies   Sertraline hcl and Zoloft [sertraline hcl]   Review of Systems Review of Systems  Constitutional:       Per HPI, otherwise negative  HENT:       Per HPI, otherwise negative  Respiratory:       Per HPI, otherwise negative  Cardiovascular:       Per HPI, otherwise negative  Gastrointestinal: Negative for vomiting.  Endocrine:       Negative aside from HPI  Genitourinary:       Neg aside from HPI   Musculoskeletal:       Per HPI, otherwise negative  Skin: Negative.   Neurological: Positive for weakness. Negative for syncope and speech difficulty.     Physical Exam Updated Vital Signs BP 114/78    Pulse 80    Temp 98.8 F (37.1 C) (Oral)    Resp (!) 25    Ht 5' 3"  (1.6 m)    Wt 104.3 kg    LMP 11/29/2015    SpO2 100%    BMI 40.74 kg/m   Physical Exam Vitals signs and nursing note reviewed.  Constitutional:      General:  She is not in acute distress.    Appearance: She is well-developed.  HENT:     Head: Normocephalic and atraumatic.     Comments: Facial asymmetry appreciable more at rest, rather than with smiling, with loss of left nasolabial fold. Eyes:     Conjunctiva/sclera: Conjunctivae normal.  Cardiovascular:     Rate and Rhythm: Normal rate and regular rhythm.  Pulmonary:     Effort: Pulmonary effort is normal. No respiratory distress.     Breath sounds: Normal breath sounds. No stridor.  Abdominal:     General: There is no distension.  Skin:    General: Skin is warm and dry.  Neurological:     Mental Status: She is alert and oriented to person, place, and time.     Cranial Nerves: Cranial nerve deficit and facial asymmetry present.     Motor: Weakness present. No atrophy.     Comments: Patient moves all extremities spontaneously, and to command. Patient describes subjective loss of strength in the left upper, lower extremities, has a 4+/5 strength in the left upper, left lower extremities. Positive left pronator drift.      ED Treatments / Results  Labs (all labs ordered are listed, but only abnormal results are displayed) Labs Reviewed  CBC - Abnormal; Notable for the following components:      Result Value   RBC 5.14 (*)    MCH 25.1 (*)    Platelets 469 (*)    All other components within normal limits  COMPREHENSIVE METABOLIC PANEL - Abnormal; Notable for the following components:   CO2 21 (*)    Glucose, Bld 109 (*)    All other components within normal limits  I-STAT CHEM 8, ED - Abnormal; Notable for the following components:   Glucose, Bld 105 (*)    All other components within normal limits  CBG MONITORING, ED - Abnormal; Notable for the following components:   Glucose-Capillary 103 (*)    All other components within normal limits  SARS CORONAVIRUS 2 (TAT 6-24 HRS)  PROTIME-INR  APTT  DIFFERENTIAL    EKG EKG Interpretation  Date/Time:  Wednesday May 23 2019 07:21:13 EDT Ventricular Rate:  83 PR Interval:  156 QRS Duration: 76 QT Interval:  378 QTC Calculation: 444 R Axis:   -5 Text Interpretation:  Normal sinus rhythm Anterior injury pattern No significant change since last tracing Abnormal ECG Confirmed by Carmin Muskrat [phone]) on 05/23/2019 7:32:10 AM   Radiology Ct Head Wo Contrast  Result Date: 05/23/2019 CLINICAL DATA:  Left facial weakness EXAM: CT HEAD WITHOUT CONTRAST TECHNIQUE: Contiguous axial images were obtained from the base of the skull through the vertex without intravenous contrast. COMPARISON:  None. FINDINGS: Brain: Ventricles are normal in size and configuration. There is a degree of invagination of CSF into the sella. There is no intracranial mass, hemorrhage, extra-axial fluid collection, or midline shift. Brain parenchyma appears unremarkable. No acute infarct. Vascular: No hyperdense vessels. No appreciable vascular calcification. Skull: Bony calvarium appears intact. Sinuses/Orbits: There is mild mucosal thickening involving several ethmoid air cells. Other paranasal sinuses are clear. Orbits appear symmetric bilaterally. Other: Mastoid air cells are clear. IMPRESSION: 1. Invagination of CSF into the sella, a finding of doubtful clinical significance. Ventricles normal in size and configuration. 2.  Brain parenchyma appears unremarkable.  No mass or hemorrhage. 3.  Mild mucosal thickening in several ethmoid air cells. Electronically Signed   By: Lowella Grip III M.D.   On: 05/23/2019 08:56   Mr Angio Head Wo Contrast  Result Date: 05/23/2019 CLINICAL DATA:  Cerebral aneurysm, subarachnoid hemorrhage, cerebral vasospasm, evaluate left-sided weakness, left facial droop. EXAM: MRI HEAD WITHOUT CONTRAST MRA HEAD WITHOUT CONTRAST TECHNIQUE: Multiplanar, multiecho pulse sequences of the brain and surrounding structures were obtained without intravenous contrast. Angiographic images of the head were obtained using MRA  technique without contrast. COMPARISON:  Noncontrast head CT 05/23/2019 FINDINGS: MRI HEAD FINDINGS Brain: Multiple sequences are motion degraded. Acute infarct measuring 2.1 x 0.7 cm involving the right corona radiata/internal capsule and extending inferiorly into the right basal ganglia. Associated T2/FLAIR hyperintensity at this site. Mild scattered T2/FLAIR hyperintensity within the cerebral white matter is nonspecific, but consistent chronic small vessel ischemic disease. No evidence of intracranial mass. No midline shift or extra-axial fluid collection. No chronic intracranial blood products. Cerebral volume is normal for age. Partially empty sella turcica. Vascular: Reported separately Skull and upper cervical spine: Normal marrow signal. Sinuses/Orbits: Open orbits visualized paranasal sinuses are clear. Visualized mastoid air cells are clear. MRA HEAD FINDINGS The examination is motion degraded. Bilateral ICA siphons patent without high-grade stenosis. Right middle and anterior cerebral arteries patent without high-grade proximal stenosis. Left middle and anterior cerebral arteries patent without high-grade proximal stenosis. Possible 2 mm aneurysm arising from the A2 right anterior cerebral artery on source images (series 3, image 61). However, evaluation is somewhat limited due to motion artifact. Visualized intracranial vertebral arteries are patent without high-grade stenosis. Basilar artery is patent without high-grade stenosis. Bilateral posterior cerebral arteries patent without high-grade proximal stenosis. A right posterior communicating artery is present. Left posterior communicating is poorly delineated and may be hypoplastic or absent. These results were called by telephone at the time of interpretation on 05/23/2019 at 3:09 pm to provider Dr. Tyrone Nine, Who verbally acknowledged these results. IMPRESSION: MRI brain: 1. Motion degraded examination. 2. 2.1 x 0.7 cm acute infarct involving right corona  radiata/internal capsule and extending into the right basal ganglia. 3. Mild chronic small vessel ischemic disease. MRA head: 1. Motion degraded examination. 2. No evidence of  intracranial large vessel occlusion or high-grade proximal arterial stenosis. 3. A 2 mm aneurysm arising the A2 right anterior cerebral artery is questioned, although evaluation is somewhat limited due to motion degradation. Consider follow-up CT or MR angiography. Electronically Signed   By: Kellie Simmering   On: 05/23/2019 15:10   Mr Brain Wo Contrast (neuro Protocol)  Result Date: 05/23/2019 CLINICAL DATA:  Cerebral aneurysm, subarachnoid hemorrhage, cerebral vasospasm, evaluate left-sided weakness, left facial droop. EXAM: MRI HEAD WITHOUT CONTRAST MRA HEAD WITHOUT CONTRAST TECHNIQUE: Multiplanar, multiecho pulse sequences of the brain and surrounding structures were obtained without intravenous contrast. Angiographic images of the head were obtained using MRA technique without contrast. COMPARISON:  Noncontrast head CT 05/23/2019 FINDINGS: MRI HEAD FINDINGS Brain: Multiple sequences are motion degraded. Acute infarct measuring 2.1 x 0.7 cm involving the right corona radiata/internal capsule and extending inferiorly into the right basal ganglia. Associated T2/FLAIR hyperintensity at this site. Mild scattered T2/FLAIR hyperintensity within the cerebral white matter is nonspecific, but consistent chronic small vessel ischemic disease. No evidence of intracranial mass. No midline shift or extra-axial fluid collection. No chronic intracranial blood products. Cerebral volume is normal for age. Partially empty sella turcica. Vascular: Reported separately Skull and upper cervical spine: Normal marrow signal. Sinuses/Orbits: Open orbits visualized paranasal sinuses are clear. Visualized mastoid air cells are clear. MRA HEAD FINDINGS The examination is motion degraded. Bilateral ICA siphons patent without high-grade stenosis. Right middle and  anterior cerebral arteries patent without high-grade proximal stenosis. Left middle and anterior cerebral arteries patent without high-grade proximal stenosis. Possible 2 mm aneurysm arising from the A2 right anterior cerebral artery on source images (series 3, image 61). However, evaluation is somewhat limited due to motion artifact. Visualized intracranial vertebral arteries are patent without high-grade stenosis. Basilar artery is patent without high-grade stenosis. Bilateral posterior cerebral arteries patent without high-grade proximal stenosis. A right posterior communicating artery is present. Left posterior communicating is poorly delineated and may be hypoplastic or absent. These results were called by telephone at the time of interpretation on 05/23/2019 at 3:09 pm to provider Dr. Tyrone Nine, Who verbally acknowledged these results. IMPRESSION: MRI brain: 1. Motion degraded examination. 2. 2.1 x 0.7 cm acute infarct involving right corona radiata/internal capsule and extending into the right basal ganglia. 3. Mild chronic small vessel ischemic disease. MRA head: 1. Motion degraded examination. 2. No evidence of intracranial large vessel occlusion or high-grade proximal arterial stenosis. 3. A 2 mm aneurysm arising the A2 right anterior cerebral artery is questioned, although evaluation is somewhat limited due to motion degradation. Consider follow-up CT or MR angiography. Electronically Signed   By: Kellie Simmering   On: 05/23/2019 15:10    Procedures Procedures (including critical care time)  Medications Ordered in ED Medications  sodium chloride flush (NS) 0.9 % injection 3 mL (3 mLs Intravenous Not Given 05/23/19 0754)  LORazepam (ATIVAN) injection 1 mg (1 mg Intravenous Given 05/23/19 1343)     Initial Impression / Assessment and Plan / ED Course  I have reviewed the triage vital signs and the nursing notes.  Pertinent labs & imaging results that were available during my care of the patient were  reviewed by me and considered in my medical decision making (see chart for details).        3:15 PM On return from MRI, patient is in similar condition per Hemodynamically unremarkable. MRI notable for evidence for acute stroke, right corona radiata. Have discussed the patient's case with our neurology colleagues. Patient was out  of the window for TPA, has NIH of 2, will require admission for further monitoring, management.  Final Clinical Impressions(s) / ED Diagnoses   Final diagnoses:  Acute ischemic stroke [facility])     Carmin Muskrat, MD 05/23/19 1517

## 2019-05-23 NOTE — ED Notes (Signed)
Pt is very agitated trying to get out of bed and trying to swing and hit staff. Pt was asked to stop and get back in bed.we finally got her in the bed and calmed her down. She asked me to page the dr and have them come tell her what is going on. Admitting has been paged

## 2019-05-23 NOTE — ED Notes (Signed)
Pt returned from MRI °

## 2019-05-23 NOTE — ED Notes (Signed)
Pt is very rude and keep pulling everything off and pulling and trying to pull her IV out. We have placed the BP cuff and monitors on her when she got back from MRI and takes them off every time we put them on.

## 2019-05-23 NOTE — ED Notes (Signed)
Pt reports L arm feels weaker than it did earlier today, states it keeps "crinkeling up". Pt also reports that she had a fall on Saturday and landed on hands and knees.

## 2019-05-23 NOTE — ED Notes (Signed)
MRI called this RN and stated pt was feeling claustrophobic and refusing to do MRI. Dr. Vanita Panda notified, and verbal order given for 1 mg of ativan.

## 2019-05-24 ENCOUNTER — Encounter (HOSPITAL_COMMUNITY): Payer: Self-pay

## 2019-05-24 ENCOUNTER — Inpatient Hospital Stay (HOSPITAL_COMMUNITY): Payer: BC Managed Care – PPO

## 2019-05-24 DIAGNOSIS — I6389 Other cerebral infarction: Secondary | ICD-10-CM

## 2019-05-24 DIAGNOSIS — I63031 Cerebral infarction due to thrombosis of right carotid artery: Secondary | ICD-10-CM

## 2019-05-24 LAB — CBC
HCT: 40.1 % (ref 36.0–46.0)
Hemoglobin: 12.5 g/dL (ref 12.0–15.0)
MCH: 25.3 pg — ABNORMAL LOW (ref 26.0–34.0)
MCHC: 31.2 g/dL (ref 30.0–36.0)
MCV: 81.2 fL (ref 80.0–100.0)
Platelets: 499 10*3/uL — ABNORMAL HIGH (ref 150–400)
RBC: 4.94 MIL/uL (ref 3.87–5.11)
RDW: 15 % (ref 11.5–15.5)
WBC: 10.7 10*3/uL — ABNORMAL HIGH (ref 4.0–10.5)
nRBC: 0 % (ref 0.0–0.2)

## 2019-05-24 LAB — ECHOCARDIOGRAM COMPLETE
Height: 63 in
Weight: 3710.78 oz

## 2019-05-24 LAB — LIPID PANEL
Cholesterol: 228 mg/dL — ABNORMAL HIGH (ref 0–200)
HDL: 32 mg/dL — ABNORMAL LOW (ref >40)
LDL Cholesterol: 166 mg/dL — ABNORMAL HIGH (ref 0–99)
Total CHOL/HDL Ratio: 7.1 RATIO
Triglycerides: 151 mg/dL — ABNORMAL HIGH (ref <150)
VLDL: 30 mg/dL (ref 0–40)

## 2019-05-24 LAB — BASIC METABOLIC PANEL
Anion gap: 13 (ref 5–15)
BUN: 7 mg/dL (ref 6–20)
CO2: 21 mmol/L — ABNORMAL LOW (ref 22–32)
Calcium: 9.8 mg/dL (ref 8.9–10.3)
Chloride: 105 mmol/L (ref 98–111)
Creatinine, Ser: 0.83 mg/dL (ref 0.44–1.00)
GFR calc Af Amer: >60 mL/min (ref >60)
GFR calc non Af Amer: >60 mL/min (ref >60)
Glucose, Bld: 117 mg/dL — ABNORMAL HIGH (ref 70–99)
Potassium: 3.6 mmol/L (ref 3.5–5.1)
Sodium: 139 mmol/L (ref 135–145)

## 2019-05-24 LAB — HEMOGLOBIN A1C
Hgb A1c MFr Bld: 6.2 % — ABNORMAL HIGH (ref 4.8–5.6)
Mean Plasma Glucose: 131.24 mg/dL

## 2019-05-24 LAB — HIV ANTIBODY (ROUTINE TESTING W REFLEX): HIV Screen 4th Generation wRfx: NONREACTIVE

## 2019-05-24 MED ORDER — HYDROCOD POLST-CPM POLST ER 10-8 MG/5ML PO SUER
5.0000 mL | Freq: Once | ORAL | Status: AC
  Administered 2019-05-24: 5 mL via ORAL
  Filled 2019-05-24: qty 5

## 2019-05-24 MED ORDER — INFLUENZA VAC SPLIT QUAD 0.5 ML IM SUSY
0.5000 mL | PREFILLED_SYRINGE | INTRAMUSCULAR | Status: AC
  Administered 2019-05-25: 12:00:00 0.5 mL via INTRAMUSCULAR
  Filled 2019-05-24: qty 0.5

## 2019-05-24 MED ORDER — LORATADINE 10 MG PO TABS
10.0000 mg | ORAL_TABLET | Freq: Every day | ORAL | Status: DC
  Administered 2019-05-24 – 2019-05-25 (×2): 10 mg via ORAL
  Filled 2019-05-24 (×2): qty 1

## 2019-05-24 NOTE — Progress Notes (Signed)
  Echocardiogram 2D Echocardiogram has been performed.  Carol Johnson 05/24/2019, 9:22 AM

## 2019-05-24 NOTE — Progress Notes (Signed)
OT Cancellation Note  Patient Details Name: Carol Johnson MRN: [id number] DOB: 05/05/1966   Cancelled Treatment:    Reason Eval/Treat Not Completed: Patient at procedure or test/ unavailable;Other (comment)(Pt with ECHO in room x1 and then receiving bath )   OT to continue to follow-up for eval.  Darryl Nestle) Marsa Aris OTR/L [place] Pager: [phone] Office: [place] 05/24/2019, 10:08 AM

## 2019-05-24 NOTE — Evaluation (Signed)
Occupational Therapy Evaluation Patient Details Name: Carol Johnson MRN: [id number] DOB: [birth year] Today's Date: 05/24/2019    History of Present Illness 53yo female c/o L sided weakness and reporting a fall at home trying to ambulate to the bathroom. CT negative, MRI positive for acute R corona radiata/internal capsule CVA with extension into the basal ganglia, also possible 65m A2 R ACA aneurysm. No tpa given. PMH anxiety, hx DVT, IBS, obesity   Clinical Impression   Pt PTA: working, independent. Pt currently limited by LUE weakness, poor mobility and decreased ability to care for self. Pt minA to maxA overall for ADL. Pt using LUE to stabilize and squeeze toothpaste onto brush while RUE brushed teeth.  MinA with RW for LLE taking larger steps due to lack of coordination. Pt would benefit from continued OT skilled services for ADL, mobility and safety in CIR setting. Pt highly motivated and could endure extended therapy periods to resume PLOF. OT following acutely.    Follow Up Recommendations  CIR    Equipment Recommendations  Other (comment)(to be determined)    Recommendations for Other Services       Precautions / Restrictions Precautions Precautions: Fall;Other (comment) Precaution Comments: L weakness Restrictions Weight Bearing Restrictions: No      Mobility Bed Mobility Overal bed mobility: Needs Assistance Bed Mobility: Supine to Sit     Supine to sit: Min assist     General bed mobility comments: MinA to bring legs around and trunk up to upright  Transfers Overall transfer level: Needs assistance Equipment used: Rolling walker (2 wheeled) Transfers: Sit to/from Stand Sit to Stand: Min assist;Min guard;+2 safety/equipment         General transfer comment: MinA to minguardA for LLE taking larger steps due to lack of coordination    Balance Overall balance assessment: Needs assistance Sitting-balance support: Feet supported;Bilateral upper extremity  supported Sitting balance-Leahy Scale: Good     Standing balance support: Bilateral upper extremity supported;During functional activity Standing balance-Leahy Scale: Fair Standing balance comment: reliant on external support; support for R hand to stay on RW                           ADL either performed or assessed with clinical judgement   ADL Overall ADL's : Needs assistance/impaired Eating/Feeding: Set up;Sitting Eating/Feeding Details (indicate cue type and reason): with RUE mostly Grooming: Min guard;Standing;Cueing for safety;Cueing for sequencing   Upper Body Bathing: Moderate assistance;Standing   Lower Body Bathing: Maximal assistance   Upper Body Dressing : Moderate assistance;Standing   Lower Body Dressing: Maximal assistance;Sitting/lateral leans;Sit to/from stand   Toilet Transfer: Minimal assistance;Ambulation;RW   Toileting- Clothing Manipulation and Hygiene: Maximal assistance;Cueing for safety;Cueing for sequencing;Sitting/lateral lean;Sit to/from stand       Functional mobility during ADLs: Minimal assistance;Rolling walker;Cueing for safety;Cueing for sequencing General ADL Comments: Pt limited by LUE weakness, poor mobility and decreased ability to care for self.     Vision Baseline Vision/History: Wears glasses Wears Glasses: At all times Patient Visual Report: No change from baseline Vision Assessment?: No apparent visual deficits     Perception Perception Perception Tested?: Yes Comments: finger to nose - poor coordination undershooting with LUE   Praxis      Pertinent Vitals/Pain Pain Assessment: 0-10 Pain Score: 2  Pain Location: mild chest pain, chest congestion Pain Descriptors / Indicators: Aching;Discomfort Pain Intervention(s): Limited activity within patient's tolerance     Hand Dominance Right   Extremity/Trunk  Assessment Upper Extremity Assessment Upper Extremity Assessment: Generalized weakness;LUE  deficits/detail LUE Deficits / Details: 3-/5 MM grade and poor coordination; poor grasp  LUE Coordination: decreased fine motor;decreased gross motor   Lower Extremity Assessment Lower Extremity Assessment: Defer to PT evaluation RLE Deficits / Details: WNL RLE Sensation: WNL RLE Coordination: WNL LLE Deficits / Details: quad 4+/5, dorsiflexors 4+/5, hip flexor 4/5 LLE Sensation: decreased proprioception LLE Coordination: decreased gross motor;decreased fine motor   Cervical / Trunk Assessment Cervical / Trunk Assessment: Normal   Communication Communication Communication: No difficulties   Cognition Arousal/Alertness: Awake/alert Behavior During Therapy: Flat affect Overall Cognitive Status: Within Functional Limits for tasks assessed                                     General Comments  Followed all commands and acted appropriately.    Exercises     Shoulder Instructions      Home Living Family/patient expects to be discharged to:: Private residence Living Arrangements: Other relatives Available Help at Discharge: Family;Available 24 hours/day Type of Home: House Home Access: Stairs to enter [employer] of Steps: 6 steps to enter, bedroom on second floor, has bilateral railings on each side Entrance Stairs-Rails: Can reach both Home Layout: Two level;Bed/bath upstairs Alternate Level Stairs-Number of Steps: flight B railings Alternate Level Stairs-Rails: Can reach both Bathroom Shower/Tub: Teacher, early years/pre: Standard Bathroom Accessibility: Yes   Home Equipment: None   Additional Comments: very independent with bathing/dressing/grooming, was driving  Lives With: Family    Prior Functioning/Environment Level of Independence: Independent                 OT Problem List: Decreased strength;Decreased activity tolerance;Impaired balance (sitting and/or standing);Decreased coordination;Decreased safety  awareness;Impaired UE functional use;Increased edema      OT Treatment/Interventions: Self-care/ADL training;Therapeutic exercise;Neuromuscular education;Energy conservation;DME and/or AE instruction;Therapeutic activities;Cognitive remediation/compensation;Visual/perceptual remediation/compensation;Patient/family education;Balance training    OT Goals(Current goals can be found in the care plan section) Acute Rehab OT Goals Patient Stated Goal: get better OT Goal Formulation: With patient Time For Goal Achievement: 06/07/19 Potential to Achieve Goals: Good ADL Goals Pt Will Perform Grooming: with modified independence;sitting Pt Will Perform Lower Body Dressing: with modified independence;sitting/lateral leans;sit to/from stand Pt Will Transfer to Toilet: with supervision;ambulating;grab bars Pt Will Perform Toileting - Clothing Manipulation and hygiene: with min guard assist;sitting/lateral leans;sit to/from stand Pt/caregiver will Perform Home Exercise Program: Increased strength;With written HEP provided;With Supervision;Left upper extremity Additional ADL Goal #1: Pt will initiate use of LUE for ADL tasks in 3/3 trials.  OT Frequency: Min 2X/week   Barriers to D/C:            Co-evaluation PT/OT/SLP Co-Evaluation/Treatment: Yes Reason for Co-Treatment: Complexity of the patient's impairments (multi-system involvement);For patient/therapist safety;To address functional/ADL transfers PT goals addressed during session: Mobility/safety with mobility;Balance;Proper use of DME OT goals addressed during session: ADL's and self-care      AM-PAC OT "6 Clicks" Daily Activity     Outcome Measure Help from another person eating meals?: A Little Help from another person taking care of personal grooming?: A Little Help from another person toileting, which includes using toliet, bedpan, or urinal?: A Lot Help from another person bathing (including washing, rinsing, drying)?: A Lot Help  from another person to put on and taking off regular upper body clothing?: A Little Help from another person to put on and taking off  regular lower body clothing?: A Lot 6 Click Score: 15   End of Session Equipment Utilized During Treatment: Gait belt;Rolling walker Nurse Communication: Mobility status  Activity Tolerance: Patient tolerated treatment well Patient left: in chair;with call bell/phone within reach;with chair alarm set  OT Visit Diagnosis: Unsteadiness on feet (R26.81);Muscle weakness (generalized) (M62.81);Hemiplegia and hemiparesis Hemiplegia - Right/Left: Left Hemiplegia - dominant/non-dominant: Non-Dominant Hemiplegia - caused by: Cerebral infarction                Time: 5885-0277 OT Time Calculation (min): 24 min Charges:  OT General Charges $OT Visit: 1 Visit OT Evaluation $OT Eval Moderate Complexity: 1 Mod  Darryl Nestle) Marsa Aris OTR/L Acute Rehabilitation Services Pager: [phone] Office: [place] 05/24/2019, 4:55 PM

## 2019-05-24 NOTE — Progress Notes (Addendum)
Patient ID: Carol Johnson, female   DOB: [birth year], 53 y.o.   MRN: [id number]  PROGRESS NOTE    Carol Johnson  [id number] DOB: [birth year] DOA: 05/23/2019 PCP: Abner Greenspan, MD   Brief Narrative:  53 year old female with history of hypertension, hyperlipidemia, DVT, ulcerative colitis and GERD presented with left sided weakness on 05/23/2019.  CT of the brain was negative for acute intracranial abnormality.  Neurology was consulted.  Patient was out of window for TPA.  MRI/MRA of the head revealed 2.1 x 0.7 cm acute infarct involving the right corona radiata/internal capsule with extension into the right basal ganglia and a possible 2 mm A2 right anterior cerebral artery aneurysm.  Assessment & Plan:   Acute ischemic stroke presenting with left-sided weakness -CT of the brain was negative for acute intracranial abnormality. - Neurology was consulted.   -Patient was out of window for TPA.  MRI/MRA of the head revealed 2.1 x 0.7 cm acute infarct involving the right corona radiata/internal capsule with extension into the right basal ganglia and a possible 2 mm A2 right anterior cerebral artery aneurysm. -PT/OT/SLP evaluation -Continue neurochecks.  Follow echo and A1c -LDL 166.  Continue Lipitor 80 mg daily -Continue aspirin.  Follow further neurology recommendations. -Patient still feels weak on the left side. -Allow for permissive hypertension  Ulcerative colitis -Continue mesalamine.  Outpatient follow-up  Essential hypertension -Monitor blood pressure.  Allow for permissive hypertension.  Thrombocytosis -Probably reactive.  Monitor  Hyperlipidemia -Continue atorvastatin  Depression with anxiety -Continue Wellbutrin and Celexa  Morbid obesity -Outpatient follow-up    DVT prophylaxis: Lovenox Code Status: Full Family Communication: None at bedside Disposition Plan: Depends on PT eval  Consultants: Neurology  Procedures: None  Antimicrobials: None  Subjective:  Patient seen and examined at bedside.  Poor historian.  Still feels that her left side is weak.  No overnight fever, nausea, vomiting, chest pain or shortness of breath.  Objective: Vitals:   05/23/19 2338 05/23/19 2342 05/24/19 0337 05/24/19 0828  BP:  116/70 124/65 (!) 146/86  Pulse:  97 84 91  Resp:  17 19 18   Temp:  98.4 F (36.9 C)  98 F (36.7 C)  TempSrc:  Oral  Oral  SpO2:  99% 96% 98%  Weight: 105.2 kg     Height: 5' 3"  (1.6 m)       Intake/Output Summary (Last 24 hours) at 05/24/2019 1013 Last data filed at 05/24/2019 0900 Gross per 24 hour  Intake 780 ml  Output -  Net 780 ml   Filed Weights   05/23/19 0849 05/23/19 2338  Weight: 104.3 kg 105.2 kg    Examination:  General exam: Appears calm and comfortable.  Poor historian. Respiratory system: Bilateral decreased breath sounds at bases Cardiovascular system: S1 & S2 heard, Rate controlled Gastrointestinal system: Abdomen is morbidly obese nondistended, soft and nontender. Normal bowel sounds heard. Extremities: No cyanosis, clubbing, edema  Central nervous system: Alert and oriented. No focal neurological deficits. Moving extremities.  Power in left upper and lower extremity is 4/5   Data Reviewed: I have personally reviewed following labs and imaging studies  CBC: Recent Labs  Lab 05/23/19 0750 05/23/19 0811 05/24/19 0405  WBC 9.6  --  10.7*  NEUTROABS 6.0  --   --   HGB 12.9 14.6 12.5  HCT 42.2 43.0 40.1  MCV 82.1  --  81.2  PLT 469*  --  448*   Basic Metabolic Panel: Recent Labs  Lab 05/23/19 0750 05/23/19  4034 05/24/19 0405  NA 138 140 139  K 4.0 4.1 3.6  CL 104 105 105  CO2 21*  --  21*  GLUCOSE 109* 105* 117*  BUN 8 8 7   CREATININE 0.90 0.70 0.83  CALCIUM 9.7  --  9.8   GFR: Estimated Creatinine Clearance: 89.9 mL/min (by C-G formula based on SCr of 0.83 mg/dL). Liver Function Tests: Recent Labs  Lab 05/23/19 0750  AST 25  ALT 29  ALKPHOS 83  BILITOT 0.6  PROT 7.9   ALBUMIN 3.9   No results for input(s): LIPASE, AMYLASE in the last 168 hours. No results for input(s): AMMONIA in the last 168 hours. Coagulation Profile: Recent Labs  Lab 05/23/19 0750  INR 0.9   Cardiac Enzymes: No results for input(s): CKTOTAL, CKMB, CKMBINDEX, TROPONINI in the last 168 hours. BNP (last 3 results) No results for input(s): PROBNP in the last 8760 hours. HbA1C: Recent Labs    05/24/19 0405  HGBA1C 6.2*   CBG: Recent Labs  Lab 05/23/19 0752  GLUCAP 103*   Lipid Profile: Recent Labs    05/24/19 0405  CHOL 228*  HDL 32*  LDLCALC 166*  TRIG 151*  CHOLHDL 7.1   Thyroid Function Tests: No results for input(s): TSH, T4TOTAL, FREET4, T3FREE, THYROIDAB in the last 72 hours. Anemia Panel: No results for input(s): VITAMINB12, FOLATE, FERRITIN, TIBC, IRON, RETICCTPCT in the last 72 hours. Sepsis Labs: No results for input(s): PROCALCITON, LATICACIDVEN in the last 168 hours.  Recent Results (from the past 240 hour(s))  SARS CORONAVIRUS 2 (TAT 6-24 HRS) Nasopharyngeal Nasopharyngeal Swab     Status: None   Collection Time: 05/23/19  3:30 PM   Specimen: Nasopharyngeal Swab  Result Value Ref Range Status   SARS Coronavirus 2 NEGATIVE NEGATIVE Final    Comment: (NOTE) SARS-CoV-2 target nucleic acids are NOT DETECTED. The SARS-CoV-2 RNA is generally detectable in upper and lower respiratory specimens during the acute phase of infection. Negative results do not preclude SARS-CoV-2 infection, do not rule out co-infections with other pathogens, and should not be used as the sole basis for treatment or other patient management decisions. Negative results must be combined with clinical observations, patient history, and epidemiological information. The expected result is Negative. Fact Sheet for Patients: SugarRoll.be Fact Sheet for Healthcare Providers: https://www.woods-mathews.com/ This test is not yet approved or  cleared by the [place] FDA and  has been authorized for detection and/or diagnosis of SARS-CoV-2 by FDA under an Emergency Use Authorization (EUA). This EUA will remain  in effect (meaning this test can be used) for the duration of the COVID-19 declaration under Section 56 4(b)(1) of the Act, 21 U.S.C. section 360bbb-3(b)(1), unless the authorization is terminated or revoked sooner. Performed at [facility], [place] [address], [place], [place] [address]          Radiology Studies: Ct Head Wo Contrast  Result Date: 05/23/2019 CLINICAL DATA:  Left facial weakness EXAM: CT HEAD WITHOUT CONTRAST TECHNIQUE: Contiguous axial images were obtained from the base of the skull through the vertex without intravenous contrast. COMPARISON:  None. FINDINGS: Brain: Ventricles are normal in size and configuration. There is a degree of invagination of CSF into the sella. There is no intracranial mass, hemorrhage, extra-axial fluid collection, or midline shift. Brain parenchyma appears unremarkable. No acute infarct. Vascular: No hyperdense vessels. No appreciable vascular calcification. Skull: Bony calvarium appears intact. Sinuses/Orbits: There is mild mucosal thickening involving several ethmoid air cells. Other paranasal sinuses are clear.  Orbits appear symmetric bilaterally. Other: Mastoid air cells are clear. IMPRESSION: 1. Invagination of CSF into the sella, a finding of doubtful clinical significance. Ventricles normal in size and configuration. 2.  Brain parenchyma appears unremarkable.  No mass or hemorrhage. 3.  Mild mucosal thickening in several ethmoid air cells. Electronically Signed   By: Lowella Grip III M.D.   On: 05/23/2019 08:56   Mr Angio Head Wo Contrast  Result Date: 05/23/2019 CLINICAL DATA:  Cerebral aneurysm, subarachnoid hemorrhage, cerebral vasospasm, evaluate left-sided weakness, left facial droop. EXAM: MRI HEAD WITHOUT CONTRAST MRA HEAD WITHOUT CONTRAST TECHNIQUE:  Multiplanar, multiecho pulse sequences of the brain and surrounding structures were obtained without intravenous contrast. Angiographic images of the head were obtained using MRA technique without contrast. COMPARISON:  Noncontrast head CT 05/23/2019 FINDINGS: MRI HEAD FINDINGS Brain: Multiple sequences are motion degraded. Acute infarct measuring 2.1 x 0.7 cm involving the right corona radiata/internal capsule and extending inferiorly into the right basal ganglia. Associated T2/FLAIR hyperintensity at this site. Mild scattered T2/FLAIR hyperintensity within the cerebral white matter is nonspecific, but consistent chronic small vessel ischemic disease. No evidence of intracranial mass. No midline shift or extra-axial fluid collection. No chronic intracranial blood products. Cerebral volume is normal for age. Partially empty sella turcica. Vascular: Reported separately Skull and upper cervical spine: Normal marrow signal. Sinuses/Orbits: Open orbits visualized paranasal sinuses are clear. Visualized mastoid air cells are clear. MRA HEAD FINDINGS The examination is motion degraded. Bilateral ICA siphons patent without high-grade stenosis. Right middle and anterior cerebral arteries patent without high-grade proximal stenosis. Left middle and anterior cerebral arteries patent without high-grade proximal stenosis. Possible 2 mm aneurysm arising from the A2 right anterior cerebral artery on source images (series 3, image 61). However, evaluation is somewhat limited due to motion artifact. Visualized intracranial vertebral arteries are patent without high-grade stenosis. Basilar artery is patent without high-grade stenosis. Bilateral posterior cerebral arteries patent without high-grade proximal stenosis. A right posterior communicating artery is present. Left posterior communicating is poorly delineated and may be hypoplastic or absent. These results were called by telephone at the time of interpretation on 05/23/2019 at  3:09 pm to provider Dr. Tyrone Nine, Who verbally acknowledged these results. IMPRESSION: MRI brain: 1. Motion degraded examination. 2. 2.1 x 0.7 cm acute infarct involving right corona radiata/internal capsule and extending into the right basal ganglia. 3. Mild chronic small vessel ischemic disease. MRA head: 1. Motion degraded examination. 2. No evidence of intracranial large vessel occlusion or high-grade proximal arterial stenosis. 3. A 2 mm aneurysm arising the A2 right anterior cerebral artery is questioned, although evaluation is somewhat limited due to motion degradation. Consider follow-up CT or MR angiography. Electronically Signed   By: Kellie Simmering   On: 05/23/2019 15:10   Mr Angio Neck W Wo Contrast  Result Date: 05/23/2019 CLINICAL DATA:  Initial evaluation for acute stroke. EXAM: MRA NECK WITHOUT AND WITH CONTRAST TECHNIQUE: Multiplanar and multiecho pulse sequences of the neck were obtained without and with intravenous contrast. Angiographic images of the neck were obtained using MRA technique without and with intravenous contrast. CONTRAST:  57m GADAVIST GADOBUTROL 1 MMOL/ML IV SOLN COMPARISON:  Prior MRI and brain MRA from earlier the same day. FINDINGS: Examination mildly limited by motion artifact. Source images reviewed. AORTIC ARCH: Visualized aortic arch of normal caliber with normal branch pattern. No hemodynamically significant stenosis seen about the origin of the great vessels. Visualized subclavian arteries widely patent. RIGHT CAROTID SYSTEM: Evaluation of the proximal right CCA mildly  limited by motion. Visualized right CCA patent to the bifurcation without stenosis. No significant atheromatous narrowing seen about the right bifurcation. Right ICA mildly tortuous but widely patent distally to the circle-of-Willis without stenosis or occlusion. LEFT CAROTID SYSTEM: Left CCA patent to the bifurcation without stenosis. Evaluation of the left bifurcation mildly limited by motion, with no  definite significant stenosis seen. Left ICA mildly tortuous but widely patent distally to the circle-of-Willis without stenosis or occlusion. VERTEBRAL ARTERIES: Both vertebral arteries arise from the subclavian arteries. Vertebral arteries widely patent within the neck without stenosis or vascular occlusion. IMPRESSION: Negative MRA of the neck. No hemodynamically significant or critical flow limiting stenosis. Electronically Signed   By: Jeannine Boga M.D.   On: 05/23/2019 20:11   Mr Brain Wo Contrast (neuro Protocol)  Result Date: 05/23/2019 CLINICAL DATA:  Cerebral aneurysm, subarachnoid hemorrhage, cerebral vasospasm, evaluate left-sided weakness, left facial droop. EXAM: MRI HEAD WITHOUT CONTRAST MRA HEAD WITHOUT CONTRAST TECHNIQUE: Multiplanar, multiecho pulse sequences of the brain and surrounding structures were obtained without intravenous contrast. Angiographic images of the head were obtained using MRA technique without contrast. COMPARISON:  Noncontrast head CT 05/23/2019 FINDINGS: MRI HEAD FINDINGS Brain: Multiple sequences are motion degraded. Acute infarct measuring 2.1 x 0.7 cm involving the right corona radiata/internal capsule and extending inferiorly into the right basal ganglia. Associated T2/FLAIR hyperintensity at this site. Mild scattered T2/FLAIR hyperintensity within the cerebral white matter is nonspecific, but consistent chronic small vessel ischemic disease. No evidence of intracranial mass. No midline shift or extra-axial fluid collection. No chronic intracranial blood products. Cerebral volume is normal for age. Partially empty sella turcica. Vascular: Reported separately Skull and upper cervical spine: Normal marrow signal. Sinuses/Orbits: Open orbits visualized paranasal sinuses are clear. Visualized mastoid air cells are clear. MRA HEAD FINDINGS The examination is motion degraded. Bilateral ICA siphons patent without high-grade stenosis. Right middle and anterior  cerebral arteries patent without high-grade proximal stenosis. Left middle and anterior cerebral arteries patent without high-grade proximal stenosis. Possible 2 mm aneurysm arising from the A2 right anterior cerebral artery on source images (series 3, image 61). However, evaluation is somewhat limited due to motion artifact. Visualized intracranial vertebral arteries are patent without high-grade stenosis. Basilar artery is patent without high-grade stenosis. Bilateral posterior cerebral arteries patent without high-grade proximal stenosis. A right posterior communicating artery is present. Left posterior communicating is poorly delineated and may be hypoplastic or absent. These results were called by telephone at the time of interpretation on 05/23/2019 at 3:09 pm to provider Dr. Tyrone Nine, Who verbally acknowledged these results. IMPRESSION: MRI brain: 1. Motion degraded examination. 2. 2.1 x 0.7 cm acute infarct involving right corona radiata/internal capsule and extending into the right basal ganglia. 3. Mild chronic small vessel ischemic disease. MRA head: 1. Motion degraded examination. 2. No evidence of intracranial large vessel occlusion or high-grade proximal arterial stenosis. 3. A 2 mm aneurysm arising the A2 right anterior cerebral artery is questioned, although evaluation is somewhat limited due to motion degradation. Consider follow-up CT or MR angiography. Electronically Signed   By: Kellie Simmering   On: 05/23/2019 15:10   Dg Chest Port 1 View  Result Date: 05/23/2019 CLINICAL DATA:  Hemiparesis, shortness of breath EXAM: PORTABLE CHEST 1 VIEW COMPARISON:  None. FINDINGS: The heart size and mediastinal contours are within normal limits. Both lungs are clear. The visualized skeletal structures are unremarkable. IMPRESSION: No active disease. Electronically Signed   By: Zetta Bills M.D.   On: 05/23/2019 16:49  Scheduled Meds: . aspirin EC  81 mg Oral Daily  . atorvastatin  80 mg Oral  q1800  . buPROPion  300 mg Oral Daily  . citalopram  20 mg Oral Daily  . clopidogrel  75 mg Oral Daily  . enoxaparin (LOVENOX) injection  40 mg Subcutaneous Q24H  . mesalamine  2.4 g Oral BID  . pantoprazole  40 mg Oral Daily  . sodium chloride flush  3 mL Intravenous Once   Continuous Infusions:   LOS: 1 day        Aline August, MD [facility] 05/24/2019, 10:13 AM

## 2019-05-24 NOTE — Evaluation (Signed)
Physical Therapy Evaluation Patient Details Name: Carol Johnson MRN: [id number] DOB: 1966/05/05 Today's Date: 05/24/2019   History of Present Illness  53yo female c/o L sided weakness and reporting a fall at home trying to ambulate to the bathroom. CT negative, MRI positive for acute R corona radiata/internal capsule CVA with extension into the basal ganglia, also possible 12m A2 R ACA aneurysm. No tpa given. PMH anxiety, hx DVT, IBS, obesity  Clinical Impression   Patient received in bed, flat affect and willing to participate in therapy today. Required MinA for functional bed mobility, able to perform sit to stand with RW and MinAx1 and min guard of second person for safety. Tolerated gait approximately 270fin room (1056fthen standing rest break at mirror/sink, plus 23f59fditional feet) with RW and min guard x2 for safety; noted reduced proprioception L LE, reduced gait speed, and that she is quick to fatigue. Further testing also reveals mild ataxia L LE. She was left up in the chair with all needs met, chair alarm active. She will continue to benefit from skilled PT services in the acute setting, strongly recommend intensive therapies in the CIR setting moving forward due to sudden and significant loss of independence and functional mobility.     Follow Up Recommendations CIR    Equipment Recommendations  Rolling walker with 5" wheels;3in1 (PT)    Recommendations for Other Services       Precautions / Restrictions Precautions Precautions: Fall;Other (comment) Precaution Comments: L weakness Restrictions Weight Bearing Restrictions: No      Mobility  Bed Mobility Overal bed mobility: Needs Assistance Bed Mobility: Supine to Sit     Supine to sit: Min assist     General bed mobility comments: MinA to bring legs around and trunk up to upright  Transfers Overall transfer level: Needs assistance Equipment used: Rolling walker (2 wheeled) Transfers: Sit to/from Stand Sit  to Stand: Min assist;Min guard;+2 safety/equipment         General transfer comment: MinA of 1 plus min guard of 1 for safety, assist mostly to stabilize RW during transfer  Ambulation/Gait Ambulation/Gait assistance: Min guard;+2 safety/equipment Gait Distance (Feet): 25 Feet(10ft39f5ft 56f standing rest break in between) Assistive device: Rolling walker (2 wheeled) Gait Pattern/deviations: Step-to pattern;Decreased step length - right;Decreased stance time - left;Decreased stride length;Decreased dorsiflexion - left;Decreased weight shift to left;Trunk flexed Gait velocity: decreased   General Gait Details: definite loss of proprioception L LE and extended time to walk gait distances, easily fatigued, min guard x2 for safety but could ambulate with min guard of +1  Stairs            Wheelchair Mobility    Modified Rankin (Stroke Patients Only)       Balance Overall balance assessment: Needs assistance Sitting-balance support: Feet supported;Bilateral upper extremity supported Sitting balance-Leahy Scale: Good     Standing balance support: Bilateral upper extremity supported;During functional activity Standing balance-Leahy Scale: Fair Standing balance comment: reliant on external support                             Pertinent Vitals/Pain Pain Assessment: 0-10 Pain Score: 2  Pain Location: mild chest pain, chest congestion Pain Descriptors / Indicators: Aching;Discomfort Pain Intervention(s): Limited activity within patient's tolerance;Monitored during session;Repositioned    Home Living Family/patient expects to be discharged to:: Private residence Living Arrangements: Other relatives Available Help at Discharge: Family;Available 24 hours/day Type of Home: House  Home Access: Stairs to enter Entrance Stairs-Rails: Can reach both Entrance Stairs-Number of Steps: 6 steps to enter, bedroom on second floor, has bilateral railings on each side Home  Layout: Two level;Bed/bath upstairs Home Equipment: None Additional Comments: very independent with bathing/dressing/grooming, was driving    Prior Function Level of Independence: Independent               Hand Dominance   Dominant Hand: Right    Extremity/Trunk Assessment   Upper Extremity Assessment Upper Extremity Assessment: Defer to OT evaluation    Lower Extremity Assessment Lower Extremity Assessment: RLE deficits/detail;LLE deficits/detail RLE Deficits / Details: WNL RLE Sensation: WNL RLE Coordination: WNL LLE Deficits / Details: quad 4+/5, dorsiflexors 4+/5, hip flexor 4/5 LLE Sensation: decreased proprioception LLE Coordination: decreased gross motor;decreased fine motor    Cervical / Trunk Assessment Cervical / Trunk Assessment: Normal  Communication   Communication: No difficulties  Cognition Arousal/Alertness: Awake/alert Behavior During Therapy: Flat affect Overall Cognitive Status: Within Functional Limits for tasks assessed                                        General Comments      Exercises     Assessment/Plan    PT Assessment Patient needs continued PT services  PT Problem List Decreased strength;Decreased mobility;Decreased safety awareness;Decreased coordination;Decreased knowledge of precautions;Obesity;Decreased activity tolerance;Decreased balance;Decreased knowledge of use of DME       PT Treatment Interventions DME instruction;Therapeutic activities;Gait training;Therapeutic exercise;Patient/family education;Stair training;Balance training;Functional mobility training;Neuromuscular re-education    PT Goals (Current goals can be found in the Care Plan section)  Acute Rehab PT Goals Patient Stated Goal: get better PT Goal Formulation: With patient Time For Goal Achievement: 06/07/19 Potential to Achieve Goals: Good    Frequency Min 4X/week   Barriers to discharge        Co-evaluation PT/OT/SLP  Co-Evaluation/Treatment: Yes Reason for Co-Treatment: For patient/therapist safety;To address functional/ADL transfers PT goals addressed during session: Mobility/safety with mobility;Balance;Proper use of DME         AM-PAC PT "6 Clicks" Mobility  Outcome Measure Help needed turning from your back to your side while in a flat bed without using bedrails?: A Little Help needed moving from lying on your back to sitting on the side of a flat bed without using bedrails?: A Little Help needed moving to and from a bed to a chair (including a wheelchair)?: A Little Help needed standing up from a chair using your arms (e.g., wheelchair or bedside chair)?: A Little Help needed to walk in hospital room?: A Little Help needed climbing 3-5 steps with a railing? : A Lot 6 Click Score: 17    End of Session Equipment Utilized During Treatment: Gait belt Activity Tolerance: Patient tolerated treatment well Patient left: in chair;with call bell/phone within reach;with chair alarm set Nurse Communication: Mobility status PT Visit Diagnosis: Unsteadiness on feet (R26.81);Muscle weakness (generalized) (M62.81);Other symptoms and signs involving the nervous system (R29.898);Other abnormalities of gait and mobility (R26.89);History of falling (Z91.81);Hemiplegia and hemiparesis Hemiplegia - Right/Left: Left Hemiplegia - dominant/non-dominant: Non-dominant Hemiplegia - caused by: Cerebral infarction    Time: 0981-1914 PT Time Calculation (min) (ACUTE ONLY): 22 min   Charges:   PT Evaluation $PT Eval Low Complexity: 1 Low          Deniece Ree PT, DPT, CBIS  Supplemental Physical Therapist [place]    Pager  [phone] Acute Rehab Office [phone]

## 2019-05-24 NOTE — Plan of Care (Signed)
Patient stated " I need to eat a healthier diet and keep up with my medication".

## 2019-05-24 NOTE — Progress Notes (Signed)
Patient arrived in the unit at 2315pm in a stretcher escorted by ED staff, alert and oriented but seems anxious and agitated, situated in a telemetry bed comfortably, bed is locked, bed alarm is on , call bell and personal belongings are within reach, and will continue to monitor closely.

## 2019-05-24 NOTE — Evaluation (Signed)
Speech Language Pathology Evaluation Patient Details Name: Carol Johnson MRN: [id number] DOB: 1966/08/06 Today's Date: 05/24/2019 Time: 4098-1191 SLP Time Calculation (min) (ACUTE ONLY): 31 min  Problem List:  Patient Active Problem List   Diagnosis Date Noted  . CVA (cerebral vascular accident) ([place]) 05/23/2019  . Thrombocytosis ([place]) 05/23/2019  . Coronary artery disease involving native coronary artery of native heart without angina pectoris 03/21/2018  . Coronary artery calcification 02/10/2018  . Morbid obesity ([place]) 02/10/2018  . Smoker 01/15/2018  . Dyspnea on exertion 12/21/2017  . Carpal tunnel syndrome 12/21/2017  . Stress incontinence 12/21/2017  . Paresthesia of both feet 07/04/2017  . Always thirsty 07/04/2017  . Abdominal bloating 12/01/2015  . Fatigue 12/01/2015  . Vitamin B12 deficiency 12/01/2015  . Lymphocytic colitis 01/16/2015  . Anemia, iron deficiency 01/16/2015  . Anxiety state 12/20/2014  . Diarrhea 11/27/2014  . Insomnia 11/27/2014  . History of DVT (deep vein thrombosis) 09/17/2014  . OSA (obstructive sleep apnea) 09/02/2014  . Gastritis, chronic 06/06/2014  . Unspecified gastritis and gastroduodenitis without mention of hemorrhage 05/03/2014  . Abdominal pain, epigastric 04/19/2014  . Internal hemorrhoids with other complication 47/82/9562  . Routine general medical examination at a health care facility 10/14/2011  . Thyroid nodule 05/16/2011  . Rectal bleeding 11/24/2010  . Hemorrhoids, internal 11/24/2010  . Irritable bowel syndrome (IBS) 11/24/2010  . ANEMIA 10/02/2009  . Hyperlipidemia 07/18/2009  . Essential hypertension 07/18/2009  . DYSPNEA ON EXERTION 07/18/2009  . THYROID CYST 01/31/2008  . ANXIETY 01/31/2008  . Depression with anxiety 01/31/2008  . GASTRIC ULCER, HX OF 01/31/2008  . FIBROIDS, UTERUS 11/13/2007  . ALLERGIC RHINITIS 11/13/2007  . ASTHMA 11/13/2007  . GERD 11/13/2007  . IRRITABLE BOWEL SYNDROME 11/13/2007  .  INTERNAL HEMORRHOIDS 11/12/2003   Past Medical History:  Past Medical History:  Diagnosis Date  . Abdominal pain, unspecified site   . Allergic rhinitis   . Anxiety   . Asthma   . Depression   . DVT (deep venous thrombosis) ([place]) 2016  . Fibroid uterus   . GERD (gastroesophageal reflux disease)   . Hypercholesterolemia   . Hypertension   . Internal hemorrhoid   . Irritable bowel syndrome   . Lymphocytic colitis 01/02/2015  . Pneumonia 1998  . Ulcerative colitis ([place])   . URI (upper respiratory infection)   . Uterine fibroid   . Vitamin B12 deficiency    Past Surgical History:  Past Surgical History:  Procedure Laterality Date  . CESAREAN SECTION  2004  . CHOLECYSTECTOMY     laparoscopic "gallstones"  . ESOPHAGOGASTRODUODENOSCOPY (EGD) WITH PROPOFOL N/A 05/03/2014   Procedure: ESOPHAGOGASTRODUODENOSCOPY (EGD) WITH PROPOFOL;  Surgeon: Inda Castle, MD;  Location: [facility];  Service: Endoscopy;  Laterality: N/A;  . MYOMECTOMY    . UTERINE FIBROID SURGERY     HPI:  Pt is a 53 y.o. female with medical history significant of  HTN, HLD, DVT, ulcerative colitis, and GERD who presented with complaints of left-sided weakness. MRI of the brain revealed 2.1 x 0.7 cm acute infarct involving right corona radiata/internal capsule and extending into the right basal ganglia.   Assessment / Plan / Recommendation Clinical Impression  Pt reported that she was living independently prior to admission and is employed full-time as an Scientist, water quality. She denied any baseline deficits in language or cognition but reported that her son states that at baseline her speech becomes "slurred" when she is tired. She expressed that she is currently tired but has not  noticed any acute changes in her speech, language or cognition.   [facility] Cognitive Assessment 8.1 was completed to evaluate the pt's cognitive-linguistic skills. She achieved a score of 27/30 which is within the normal limits of 26  or more out of 30 and no speech/language deficits were demonstrated. Further acute skilled SLP services are not clinically indicated at this time. However, she was advised to follow up with her PCP if she notices increased difficulty with higher-level tasks following discharge and she verbalized agreement. Pt and nursing were educated regarding results and recommendations; both parties verbalized understanding as well as agreement with plan of care.    SLP Assessment  SLP Recommendation/Assessment: Patient does not need any further Speech Lanaguage Pathology Services SLP Visit Diagnosis: Cognitive communication deficit (R41.841)    Follow Up Recommendations  None    Frequency and Duration           SLP Evaluation Cognition  Overall Cognitive Status: Within Functional Limits for tasks assessed Arousal/Alertness: Awake/alert Orientation Level: Oriented to person;Oriented to place;Oriented to situation;Disoriented to time Attention: Focused;Sustained Focused Attention: Appears intact(Vigilance WNL: 1/1) Sustained Attention: Appears intact(Serial 7s: 3/3) Memory: Appears intact(Immediate: 5/5; delayed: 5/5) Awareness: Appears intact Executive Function: Reasoning;Sequencing;Organizing Reasoning: Appears intact(Abstraction: 2/2) Sequencing: Impaired Sequencing Impairment: Verbal complex(Clock drawing: 2/3) Organizing: Appears intact(Backward digit span: 1/1)       Comprehension  Auditory Comprehension Overall Auditory Comprehension: Appears within functional limits for tasks assessed Yes/No Questions: Within Functional Limits Commands: Impaired Complex Commands: (Trail completion: 0/1) Conversation: Complex Visual Recognition/Discrimination Discrimination: Within Function Limits Reading Comprehension Reading Status: Not tested    Expression Expression Primary Mode of Expression: Verbal Verbal Expression Overall Verbal Expression: Appears within functional limits for tasks  assessed Initiation: No impairment Level of Generative/Spontaneous Verbalization: Conversation Repetition: No impairment(2/2) Naming: No impairment(Confrontational: 3/3; divergent 1/1) Pragmatics: No impairment Written Expression Dominant Hand: Right Written Expression: (Copying cube: 1/1)   Oral / Motor  Oral Motor/Sensory Function Overall Oral Motor/Sensory Function: Within functional limits Motor Speech Overall Motor Speech: Appears within functional limits for tasks assessed Respiration: Within functional limits Phonation: Normal Resonance: Within functional limits Articulation: Within functional limitis Intelligibility: Intelligible Motor Planning: Witnin functional limits Motor Speech Errors: Not applicable   Jacorion Klem I. Hardin Negus, [place], [place] Office number [phone] Pager [place] 05/24/2019, 12:21 PM

## 2019-05-24 NOTE — Progress Notes (Signed)
STROKE TEAM PROGRESS NOTE   INTERVAL HISTORY No family is at the bedside.  Pt lying in bed, still has left hemiparesis. She has UC and needs to go to bathroom for BM. I along with RN helped her ambulate to bathroom, she has left hemiparetic gait.   Vitals:   05/23/19 2338 05/23/19 2342 05/24/19 0337 05/24/19 0828  BP:  116/70 124/65 (!) 146/86  Pulse:  97 84 91  Resp:  17 19 18   Temp:  98.4 F (36.9 C)  98 F (36.7 C)  TempSrc:  Oral  Oral  SpO2:  99% 96% 98%  Weight: 105.2 kg     Height: 5' 3"  (1.6 m)       CBC:  Recent Labs  Lab 05/23/19 0750 05/23/19 0811 05/24/19 0405  WBC 9.6  --  10.7*  NEUTROABS 6.0  --   --   HGB 12.9 14.6 12.5  HCT 42.2 43.0 40.1  MCV 82.1  --  81.2  PLT 469*  --  499*    Basic Metabolic Panel:  Recent Labs  Lab 05/23/19 0750 05/23/19 0811 05/24/19 0405  NA 138 140 139  K 4.0 4.1 3.6  CL 104 105 105  CO2 21*  --  21*  GLUCOSE 109* 105* 117*  BUN 8 8 7   CREATININE 0.90 0.70 0.83  CALCIUM 9.7  --  9.8   Lipid Panel:     Component Value Date/Time   CHOL 228 (H) 05/24/2019 0405   TRIG 151 (H) 05/24/2019 0405   HDL 32 (L) 05/24/2019 0405   CHOLHDL 7.1 05/24/2019 0405   VLDL 30 05/24/2019 0405   LDLCALC 166 (H) 05/24/2019 0405   HgbA1c:  Lab Results  Component Value Date   HGBA1C 6.2 (H) 05/24/2019   Urine Drug Screen: No results found for: LABOPIA, COCAINSCRNUR, LABBENZ, AMPHETMU, THCU, LABBARB  Alcohol Level No results found for: ETH  IMAGING Ct Head Wo Contrast  Result Date: 05/23/2019 CLINICAL DATA:  Left facial weakness EXAM: CT HEAD WITHOUT CONTRAST TECHNIQUE: Contiguous axial images were obtained from the base of the skull through the vertex without intravenous contrast. COMPARISON:  None. FINDINGS: Brain: Ventricles are normal in size and configuration. There is a degree of invagination of CSF into the sella. There is no intracranial mass, hemorrhage, extra-axial fluid collection, or midline shift. Brain parenchyma  appears unremarkable. No acute infarct. Vascular: No hyperdense vessels. No appreciable vascular calcification. Skull: Bony calvarium appears intact. Sinuses/Orbits: There is mild mucosal thickening involving several ethmoid air cells. Other paranasal sinuses are clear. Orbits appear symmetric bilaterally. Other: Mastoid air cells are clear. IMPRESSION: 1. Invagination of CSF into the sella, a finding of doubtful clinical significance. Ventricles normal in size and configuration. 2.  Brain parenchyma appears unremarkable.  No mass or hemorrhage. 3.  Mild mucosal thickening in several ethmoid air cells. Electronically Signed   By: Lowella Grip III M.D.   On: 05/23/2019 08:56   Mr Angio Head Wo Contrast  Result Date: 05/23/2019 CLINICAL DATA:  Cerebral aneurysm, subarachnoid hemorrhage, cerebral vasospasm, evaluate left-sided weakness, left facial droop. EXAM: MRI HEAD WITHOUT CONTRAST MRA HEAD WITHOUT CONTRAST TECHNIQUE: Multiplanar, multiecho pulse sequences of the brain and surrounding structures were obtained without intravenous contrast. Angiographic images of the head were obtained using MRA technique without contrast. COMPARISON:  Noncontrast head CT 05/23/2019 FINDINGS: MRI HEAD FINDINGS Brain: Multiple sequences are motion degraded. Acute infarct measuring 2.1 x 0.7 cm involving the right corona radiata/internal capsule and extending inferiorly into the  right basal ganglia. Associated T2/FLAIR hyperintensity at this site. Mild scattered T2/FLAIR hyperintensity within the cerebral white matter is nonspecific, but consistent chronic small vessel ischemic disease. No evidence of intracranial mass. No midline shift or extra-axial fluid collection. No chronic intracranial blood products. Cerebral volume is normal for age. Partially empty sella turcica. Vascular: Reported separately Skull and upper cervical spine: Normal marrow signal. Sinuses/Orbits: Open orbits visualized paranasal sinuses are clear.  Visualized mastoid air cells are clear. MRA HEAD FINDINGS The examination is motion degraded. Bilateral ICA siphons patent without high-grade stenosis. Right middle and anterior cerebral arteries patent without high-grade proximal stenosis. Left middle and anterior cerebral arteries patent without high-grade proximal stenosis. Possible 2 mm aneurysm arising from the A2 right anterior cerebral artery on source images (series 3, image 61). However, evaluation is somewhat limited due to motion artifact. Visualized intracranial vertebral arteries are patent without high-grade stenosis. Basilar artery is patent without high-grade stenosis. Bilateral posterior cerebral arteries patent without high-grade proximal stenosis. A right posterior communicating artery is present. Left posterior communicating is poorly delineated and may be hypoplastic or absent. These results were called by telephone at the time of interpretation on 05/23/2019 at 3:09 pm to provider Dr. Tyrone Nine, Who verbally acknowledged these results. IMPRESSION: MRI brain: 1. Motion degraded examination. 2. 2.1 x 0.7 cm acute infarct involving right corona radiata/internal capsule and extending into the right basal ganglia. 3. Mild chronic small vessel ischemic disease. MRA head: 1. Motion degraded examination. 2. No evidence of intracranial large vessel occlusion or high-grade proximal arterial stenosis. 3. A 2 mm aneurysm arising the A2 right anterior cerebral artery is questioned, although evaluation is somewhat limited due to motion degradation. Consider follow-up CT or MR angiography. Electronically Signed   By: Kellie Simmering   On: 05/23/2019 15:10   Mr Angio Neck W Wo Contrast  Result Date: 05/23/2019 CLINICAL DATA:  Initial evaluation for acute stroke. EXAM: MRA NECK WITHOUT AND WITH CONTRAST TECHNIQUE: Multiplanar and multiecho pulse sequences of the neck were obtained without and with intravenous contrast. Angiographic images of the neck were obtained  using MRA technique without and with intravenous contrast. CONTRAST:  59m GADAVIST GADOBUTROL 1 MMOL/ML IV SOLN COMPARISON:  Prior MRI and brain MRA from earlier the same day. FINDINGS: Examination mildly limited by motion artifact. Source images reviewed. AORTIC ARCH: Visualized aortic arch of normal caliber with normal branch pattern. No hemodynamically significant stenosis seen about the origin of the great vessels. Visualized subclavian arteries widely patent. RIGHT CAROTID SYSTEM: Evaluation of the proximal right CCA mildly limited by motion. Visualized right CCA patent to the bifurcation without stenosis. No significant atheromatous narrowing seen about the right bifurcation. Right ICA mildly tortuous but widely patent distally to the circle-of-Willis without stenosis or occlusion. LEFT CAROTID SYSTEM: Left CCA patent to the bifurcation without stenosis. Evaluation of the left bifurcation mildly limited by motion, with no definite significant stenosis seen. Left ICA mildly tortuous but widely patent distally to the circle-of-Willis without stenosis or occlusion. VERTEBRAL ARTERIES: Both vertebral arteries arise from the subclavian arteries. Vertebral arteries widely patent within the neck without stenosis or vascular occlusion. IMPRESSION: Negative MRA of the neck. No hemodynamically significant or critical flow limiting stenosis. Electronically Signed   By: BJeannine BogaM.D.   On: 05/23/2019 20:11   Mr Brain Wo Contrast (neuro Protocol)  Result Date: 05/23/2019 CLINICAL DATA:  Cerebral aneurysm, subarachnoid hemorrhage, cerebral vasospasm, evaluate left-sided weakness, left facial droop. EXAM: MRI HEAD WITHOUT CONTRAST MRA HEAD WITHOUT CONTRAST  TECHNIQUE: Multiplanar, multiecho pulse sequences of the brain and surrounding structures were obtained without intravenous contrast. Angiographic images of the head were obtained using MRA technique without contrast. COMPARISON:  Noncontrast head CT  05/23/2019 FINDINGS: MRI HEAD FINDINGS Brain: Multiple sequences are motion degraded. Acute infarct measuring 2.1 x 0.7 cm involving the right corona radiata/internal capsule and extending inferiorly into the right basal ganglia. Associated T2/FLAIR hyperintensity at this site. Mild scattered T2/FLAIR hyperintensity within the cerebral white matter is nonspecific, but consistent chronic small vessel ischemic disease. No evidence of intracranial mass. No midline shift or extra-axial fluid collection. No chronic intracranial blood products. Cerebral volume is normal for age. Partially empty sella turcica. Vascular: Reported separately Skull and upper cervical spine: Normal marrow signal. Sinuses/Orbits: Open orbits visualized paranasal sinuses are clear. Visualized mastoid air cells are clear. MRA HEAD FINDINGS The examination is motion degraded. Bilateral ICA siphons patent without high-grade stenosis. Right middle and anterior cerebral arteries patent without high-grade proximal stenosis. Left middle and anterior cerebral arteries patent without high-grade proximal stenosis. Possible 2 mm aneurysm arising from the A2 right anterior cerebral artery on source images (series 3, image 61). However, evaluation is somewhat limited due to motion artifact. Visualized intracranial vertebral arteries are patent without high-grade stenosis. Basilar artery is patent without high-grade stenosis. Bilateral posterior cerebral arteries patent without high-grade proximal stenosis. A right posterior communicating artery is present. Left posterior communicating is poorly delineated and may be hypoplastic or absent. These results were called by telephone at the time of interpretation on 05/23/2019 at 3:09 pm to provider Dr. Tyrone Nine, Who verbally acknowledged these results. IMPRESSION: MRI brain: 1. Motion degraded examination. 2. 2.1 x 0.7 cm acute infarct involving right corona radiata/internal capsule and extending into the right basal  ganglia. 3. Mild chronic small vessel ischemic disease. MRA head: 1. Motion degraded examination. 2. No evidence of intracranial large vessel occlusion or high-grade proximal arterial stenosis. 3. A 2 mm aneurysm arising the A2 right anterior cerebral artery is questioned, although evaluation is somewhat limited due to motion degradation. Consider follow-up CT or MR angiography. Electronically Signed   By: Kellie Simmering   On: 05/23/2019 15:10   Dg Chest Port 1 View  Result Date: 05/23/2019 CLINICAL DATA:  Hemiparesis, shortness of breath EXAM: PORTABLE CHEST 1 VIEW COMPARISON:  None. FINDINGS: The heart size and mediastinal contours are within normal limits. Both lungs are clear. The visualized skeletal structures are unremarkable. IMPRESSION: No active disease. Electronically Signed   By: Zetta Bills M.D.   On: 05/23/2019 16:49    PHYSICAL EXAM  Temp:  [98 F (36.7 C)-98.4 F (36.9 C)] 98.3 F (36.8 C) (09/24 1257) Pulse Rate:  [82-97] 85 (09/24 1257) Resp:  [16-23] 17 (09/24 1257) BP: (116-161)/(65-90) 124/80 (09/24 1257) SpO2:  [96 %-100 %] 96 % (09/24 1257) Weight:  [105.2 kg] 105.2 kg (09/23 2338)  General - morbid obesity, well developed, in no apparent distress.  Ophthalmologic - fundi not visualized due to noncooperation.  Cardiovascular - Regular rhythm and rate.  Mental Status -  Level of arousal and orientation to time, place, and person were intact. Language including expression, naming, repetition, comprehension was assessed and found intact. Fund of Knowledge was assessed and was intact.  Cranial Nerves II - XII - II - Visual field intact OU. III, IV, VI - Extraocular movements intact. V - Facial sensation intact bilaterally. VII - Facial movement intact bilaterally. VIII - Hearing & vestibular intact bilaterally. X - Palate elevates symmetrically. XI -  Chin turning & shoulder shrug intact bilaterally. XII - Tongue protrusion intact.  Motor Strength - The  patient's strength was normal in right UE and LE, however, left UE 3+/5 deltoid, 4/5 bicep, 3+/5 tricep and 4/5 hand grip, decreased dexterity. left LE 4+/5 proximal and 3/5 distally. Bulk was normal and fasciculations were absent.   Motor Tone - Muscle tone was assessed at the neck and appendages and was normal.  Reflexes - The patient's reflexes were symmetrical in all extremities and she had no pathological reflexes.  Sensory - Light touch, temperature/pinprick were assessed and were symmetrical.    Coordination - The patient had normal movements in the right hand with no ataxia or dysmetria.  Tremor was absent.  Gait and Station - deferred.   ASSESSMENT/PLAN Ms. LAMONICA TRUEBA is a 53 y.o. female with history of of hypertension, depression, hypercholesterolemia, ulcerative colitis, irritable bowel, B12 deficiency, DVT in the past not on anticoagulation, asthma and concern for pulmonary fibrosis presenting with L sided weakness.   Stroke:  R posterior corona radiata/ PLIC infarct, AchA territory, likey secondary to small vessel disease source  CT head invagniation of CSF into sella. Ethmoid mucosal thickening  MRI  R corona radiata / internal capsule infarct extending into R basal ganglia. Mild small vessel disease.    MRA head  No ELVO. R ACA A2 31m aneurysm.  MRA neck Unremarkable   2D Echo pending  Given AchA territory infarct, will recommend 30 day cardiac event monitoring to rule out afib.  LDL 166  HgbA1c 6.2  Lovenox 40 mg sq daily for VTE prophylaxis  No antithrombotic prior to admission, now on aspirin 81 mg daily and clopidogrel 75 mg daily. Continue DAPT x 3 weeks then ASPIRIN alone  Therapy recommendations:  pending   Disposition:  pending   Hypertension  Stable . Permissive hypertension (OK if < 220/120) but gradually normalize in 5-7 days . Long-term BP goal normotensive  Hyperlipidemia  Home meds:  lipitor 10  Now on lipitor 80  LDL 166, goal <  70  Continue statin at discharge  Hx of DVT  08/2014 left posterior tibial vein DVT, contributed to estrogen use and smoking at that time.  Was on Xarelto, now off.  Other Stroke Risk Factors  Former Cigarette smoker, quit 17 mos ago  ALCOHOL use, advised to limit to 1 drink/day  Morbid Obesity, Body mass index is 41.08 kg/m., recommend weight loss, diet and exercise as appropriate   Other Active Problems  Thrombocytosis platelet 499  Depression with anxiety on wellbutrin and celexa  Ulcerative colitis on mesalamine   Hospital day # 1  Neurology will sign off. Please call with questions. [facility] Thanks for the consult.  JRosalin Hawking MD PhD Stroke Neurology 05/24/2019 3:57 PM   To contact Stroke Continuity provider, please refer to [email or web address] After hours, contact General Neurology

## 2019-05-25 ENCOUNTER — Other Ambulatory Visit: Payer: Self-pay | Admitting: Physician Assistant

## 2019-05-25 ENCOUNTER — Inpatient Hospital Stay (HOSPITAL_COMMUNITY)
Admission: RE | Admit: 2019-05-25 | Discharge: 2019-06-06 | DRG: 057 | Disposition: A | Discharge status: Home / Self Care | Payer: BC Managed Care – PPO | Source: Intra-hospital | Attending: Physical Medicine & Rehabilitation | Admitting: Physical Medicine & Rehabilitation

## 2019-05-25 ENCOUNTER — Encounter (HOSPITAL_COMMUNITY): Payer: Self-pay

## 2019-05-25 DIAGNOSIS — Z79899 Other long term (current) drug therapy: Secondary | ICD-10-CM

## 2019-05-25 DIAGNOSIS — Z87891 Personal history of nicotine dependence: Secondary | ICD-10-CM

## 2019-05-25 DIAGNOSIS — I69354 Hemiplegia and hemiparesis following cerebral infarction affecting left non-dominant side: Principal | ICD-10-CM

## 2019-05-25 DIAGNOSIS — Z8249 Family history of ischemic heart disease and other diseases of the circulatory system: Secondary | ICD-10-CM | POA: Diagnosis not present

## 2019-05-25 DIAGNOSIS — I639 Cerebral infarction, unspecified: Secondary | ICD-10-CM | POA: Diagnosis not present

## 2019-05-25 DIAGNOSIS — N63 Unspecified lump in unspecified breast: Secondary | ICD-10-CM | POA: Diagnosis present

## 2019-05-25 DIAGNOSIS — I63031 Cerebral infarction due to thrombosis of right carotid artery: Secondary | ICD-10-CM

## 2019-05-25 DIAGNOSIS — E785 Hyperlipidemia, unspecified: Secondary | ICD-10-CM | POA: Diagnosis present

## 2019-05-25 DIAGNOSIS — Z800 Family history of malignant neoplasm of digestive organs: Secondary | ICD-10-CM

## 2019-05-25 DIAGNOSIS — J00 Acute nasopharyngitis [common cold]: Secondary | ICD-10-CM | POA: Diagnosis present

## 2019-05-25 DIAGNOSIS — Z791 Long term (current) use of non-steroidal anti-inflammatories (NSAID): Secondary | ICD-10-CM | POA: Diagnosis not present

## 2019-05-25 DIAGNOSIS — G4700 Insomnia, unspecified: Secondary | ICD-10-CM | POA: Diagnosis present

## 2019-05-25 DIAGNOSIS — Z86718 Personal history of other venous thrombosis and embolism: Secondary | ICD-10-CM

## 2019-05-25 DIAGNOSIS — Z8349 Family history of other endocrine, nutritional and metabolic diseases: Secondary | ICD-10-CM | POA: Diagnosis not present

## 2019-05-25 DIAGNOSIS — E7800 Pure hypercholesterolemia, unspecified: Secondary | ICD-10-CM | POA: Diagnosis present

## 2019-05-25 DIAGNOSIS — M62838 Other muscle spasm: Secondary | ICD-10-CM | POA: Diagnosis present

## 2019-05-25 DIAGNOSIS — R531 Weakness: Secondary | ICD-10-CM | POA: Diagnosis not present

## 2019-05-25 DIAGNOSIS — I10 Essential (primary) hypertension: Secondary | ICD-10-CM | POA: Diagnosis present

## 2019-05-25 DIAGNOSIS — Z9049 Acquired absence of other specified parts of digestive tract: Secondary | ICD-10-CM

## 2019-05-25 DIAGNOSIS — F419 Anxiety disorder, unspecified: Secondary | ICD-10-CM | POA: Diagnosis present

## 2019-05-25 DIAGNOSIS — K51919 Ulcerative colitis, unspecified with unspecified complications: Secondary | ICD-10-CM

## 2019-05-25 DIAGNOSIS — K219 Gastro-esophageal reflux disease without esophagitis: Secondary | ICD-10-CM | POA: Diagnosis present

## 2019-05-25 DIAGNOSIS — K5190 Ulcerative colitis, unspecified, without complications: Secondary | ICD-10-CM | POA: Diagnosis present

## 2019-05-25 DIAGNOSIS — Z808 Family history of malignant neoplasm of other organs or systems: Secondary | ICD-10-CM

## 2019-05-25 DIAGNOSIS — I739 Peripheral vascular disease, unspecified: Secondary | ICD-10-CM | POA: Diagnosis present

## 2019-05-25 DIAGNOSIS — F329 Major depressive disorder, single episode, unspecified: Secondary | ICD-10-CM | POA: Diagnosis not present

## 2019-05-25 DIAGNOSIS — E669 Obesity, unspecified: Secondary | ICD-10-CM | POA: Diagnosis present

## 2019-05-25 DIAGNOSIS — Z8049 Family history of malignant neoplasm of other genital organs: Secondary | ICD-10-CM

## 2019-05-25 DIAGNOSIS — E876 Hypokalemia: Secondary | ICD-10-CM

## 2019-05-25 DIAGNOSIS — Z888 Allergy status to other drugs, medicaments and biological substances status: Secondary | ICD-10-CM

## 2019-05-25 DIAGNOSIS — D62 Acute posthemorrhagic anemia: Secondary | ICD-10-CM | POA: Diagnosis not present

## 2019-05-25 DIAGNOSIS — J45909 Unspecified asthma, uncomplicated: Secondary | ICD-10-CM | POA: Diagnosis not present

## 2019-05-25 DIAGNOSIS — R058 Other specified cough: Secondary | ICD-10-CM

## 2019-05-25 DIAGNOSIS — Z6841 Body Mass Index (BMI) 40.0 and over, adult: Secondary | ICD-10-CM

## 2019-05-25 DIAGNOSIS — R05 Cough: Secondary | ICD-10-CM

## 2019-05-25 DIAGNOSIS — K5100 Ulcerative (chronic) pancolitis without complications: Secondary | ICD-10-CM

## 2019-05-25 MED ORDER — LORATADINE 10 MG PO TABS
10.0000 mg | ORAL_TABLET | Freq: Every day | ORAL | Status: DC
  Administered 2019-05-26 – 2019-06-06 (×12): 10 mg via ORAL
  Filled 2019-05-25 (×12): qty 1

## 2019-05-25 MED ORDER — ALBUTEROL SULFATE (2.5 MG/3ML) 0.083% IN NEBU
2.5000 mg | INHALATION_SOLUTION | Freq: Four times a day (QID) | RESPIRATORY_TRACT | Status: DC | PRN
  Administered 2019-05-25 – 2019-05-27 (×3): 2.5 mg via RESPIRATORY_TRACT
  Filled 2019-05-25 (×2): qty 3

## 2019-05-25 MED ORDER — SORBITOL 70 % SOLN
30.0000 mL | Freq: Every day | Status: DC | PRN
  Filled 2019-05-25: qty 30

## 2019-05-25 MED ORDER — ACETAMINOPHEN 325 MG PO TABS
650.0000 mg | ORAL_TABLET | ORAL | Status: DC | PRN
  Administered 2019-05-25 – 2019-06-04 (×15): 650 mg via ORAL
  Filled 2019-05-25 (×21): qty 2

## 2019-05-25 MED ORDER — ACETAMINOPHEN 160 MG/5ML PO SOLN: 650.0000 mg | ORAL | Status: DC | PRN

## 2019-05-25 MED ORDER — CITALOPRAM HYDROBROMIDE 20 MG PO TABS
20.0000 mg | ORAL_TABLET | Freq: Every day | ORAL | Status: DC
  Administered 2019-05-26 – 2019-06-06 (×12): 20 mg via ORAL
  Filled 2019-05-25 (×12): qty 1

## 2019-05-25 MED ORDER — MESALAMINE 1.2 G PO TBEC
2.4000 g | DELAYED_RELEASE_TABLET | Freq: Two times a day (BID) | ORAL | Status: DC
  Administered 2019-05-25 – 2019-06-06 (×24): 2.4 g via ORAL
  Filled 2019-05-25 (×25): qty 2

## 2019-05-25 MED ORDER — ENOXAPARIN SODIUM 40 MG/0.4ML ~~LOC~~ SOLN
40.0000 mg | SUBCUTANEOUS | Status: DC
  Administered 2019-05-26 – 2019-06-05 (×11): 40 mg via SUBCUTANEOUS
  Filled 2019-05-25 (×11): qty 0.4

## 2019-05-25 MED ORDER — ENOXAPARIN SODIUM 40 MG/0.4ML ~~LOC~~ SOLN: 40.0000 mg | SUBCUTANEOUS | Status: DC

## 2019-05-25 MED ORDER — ZOLPIDEM TARTRATE 5 MG PO TABS
5.0000 mg | ORAL_TABLET | Freq: Every evening | ORAL | Status: DC | PRN
  Administered 2019-05-25 – 2019-06-05 (×12): 5 mg via ORAL
  Filled 2019-05-25 (×13): qty 1

## 2019-05-25 MED ORDER — ATORVASTATIN CALCIUM 80 MG PO TABS: 80.0000 mg | ORAL_TABLET | Freq: Every day | ORAL | 0 refills | Status: DC

## 2019-05-25 MED ORDER — CLOPIDOGREL BISULFATE 75 MG PO TABS
75.0000 mg | ORAL_TABLET | Freq: Every day | ORAL | Status: DC
  Administered 2019-05-26 – 2019-06-06 (×12): 75 mg via ORAL
  Filled 2019-05-25 (×12): qty 1

## 2019-05-25 MED ORDER — CLOPIDOGREL BISULFATE 75 MG PO TABS: 75.0000 mg | ORAL_TABLET | Freq: Every day | ORAL | 0 refills | Status: DC

## 2019-05-25 MED ORDER — BUPROPION HCL ER (XL) 300 MG PO TB24
300.0000 mg | ORAL_TABLET | Freq: Every day | ORAL | Status: DC
  Administered 2019-05-26 – 2019-06-06 (×12): 300 mg via ORAL
  Filled 2019-05-25 (×12): qty 1

## 2019-05-25 MED ORDER — PANTOPRAZOLE SODIUM 40 MG PO TBEC
40.0000 mg | DELAYED_RELEASE_TABLET | Freq: Every day | ORAL | Status: DC
  Administered 2019-05-26 – 2019-06-06 (×12): 40 mg via ORAL
  Filled 2019-05-25 (×12): qty 1

## 2019-05-25 MED ORDER — ASPIRIN 81 MG PO TBEC: 81.0000 mg | DELAYED_RELEASE_TABLET | Freq: Every day | ORAL | 0 refills | Status: DC

## 2019-05-25 MED ORDER — ATORVASTATIN CALCIUM 80 MG PO TABS
80.0000 mg | ORAL_TABLET | Freq: Every day | ORAL | Status: DC
  Administered 2019-05-26 – 2019-06-05 (×11): 80 mg via ORAL
  Filled 2019-05-25 (×11): qty 1

## 2019-05-25 MED ORDER — ACETAMINOPHEN 650 MG RE SUPP: 650.0000 mg | RECTAL | Status: DC | PRN

## 2019-05-25 MED ORDER — ASPIRIN EC 81 MG PO TBEC
81.0000 mg | DELAYED_RELEASE_TABLET | Freq: Every day | ORAL | Status: DC
  Administered 2019-05-26 – 2019-06-06 (×12): 81 mg via ORAL
  Filled 2019-05-25 (×12): qty 1

## 2019-05-25 NOTE — Progress Notes (Signed)
Meredith Staggers, MD  Physician  Physical Medicine and Rehabilitation  PMR Pre-admission  Signed  Date of Service:  05/25/2019 12:46 PM      Related encounter: ED to Hosp-Admission (Current) from 05/23/2019 in [facility]      Signed         Show:Clear all _0 Manual_1 Template_2 Copied  Added by: _3 Meredith Staggers, MD_4 Michel Santee, PT  _5 Hover for details PMR Admission Coordinator Pre-Admission Assessment  Patient: Carol Johnson is an 53 y.o., female MRN: [id number] DOB: Dec 05, 1965 Height: _6  (160 cm) Weight: 105.2 kg  Insurance Information HMO:     PPO: yes     PCP:      IPA:      80/20:      OTHER:  [facility]      Policy#: [id number]      Subscriber: patient CM Name: Dominick      Phone#: [phone]     Fax#: no fax Pre-Cert#: Case [id number]; Josem Kaufmann provided by [facility] with updates due to him on 9/29 if pt still in house.        Employer:  Benefits:  Phone #: [phone]     Name:  Eff. Date: 04/25/2017     Deduct: $700 ($180.60 met)      Out of Pocket Max: $4000 ($577.71 met)      Life Max: n/a CIR: 80%      SNF: 80% Outpatient: 80%     Co-Pay: 20% Home Health: 80%      Co-Pay: 20% DME: 80%     Co-Pay: 20% Providers: preferred network  SECONDARY:       Policy#:       Subscriber:  CM Name:       Phone#:      Fax#:  Pre-Cert#:       Employer:  Benefits:  Phone #:      Name:  Eff. Date:      Deduct:       Out of Pocket Max:       Life Max:  CIR:       SNF:  Outpatient:      Co-Pay:  Home Health:       Co-Pay:  DME:      Co-Pay:   Medicaid Application Date:       Case Manager:  Disability Application Date:       Case Worker:   The "Data Collection Information Summary" for patients in Inpatient Rehabilitation Facilities with attached "Privacy Act [place] Records" was provided and verbally reviewed with: N/A  Emergency Contact Information         Contact Information    Name Relation Home  Work [place] F Spouse [phone]        Current Medical History  Patient Admitting Diagnosis: CVA  History of Present Illness: Carol Johnson is a 53 year old right-handed female history of hypertension, ulcerative colitis, B12 deficiency, DVT 2016 left posterior tibial vein contributed to estrogen use and smoking. She was on Xarelto and has been since completed, hyperlipidemia, asthma, and quit smoking 17 months ago. Presented 05/23/2019 with left-sided weakness. Cranial CT scan negative. MRI showed a 2 cm acute infarct right corona radiata internal capsule extending into the right basal ganglia. MRA head with no LVO but concerning for possible 2 mm aneurysm from right A2 division of the anterior cerebral artery. MRA of neck unremarkable for any acute occlusion. Echocardiogram with ejection fraction of 65%. Neurology follow-up  placed on aspirin and Plavix x3 weeks then aspirin alone. Subcutaneous Lovenox for DVT prophylaxis. Recommendations for 30-day cardiac event monitor as outpatient. Tolerating a regular consistency diet. Therapy evaluations completed and patient was recommended for a comprehensive rehab program Complete NIHSS TOTAL: 5  Patient's medical [facility] from [facility] has been reviewed by the rehabilitation admission coordinator and physician.  Past Medical History      Past Medical History:  Diagnosis Date  . Abdominal pain, unspecified site   . Allergic rhinitis   . Anxiety   . Asthma   . Depression   . DVT (deep venous thrombosis) ([place]) 2016  . Fibroid uterus   . GERD (gastroesophageal reflux disease)   . Hypercholesterolemia   . Hypertension   . Internal hemorrhoid   . Irritable bowel syndrome   . Lymphocytic colitis 01/02/2015  . Pneumonia 1998  . Ulcerative colitis ([place])   . URI (upper respiratory infection)   . Uterine fibroid   . Vitamin B12 deficiency     Family History   family history  includes Colon cancer in her paternal aunt; Heart disease in her mother, paternal aunt, and paternal uncle; Heart failure in her maternal grandmother; Hyperlipidemia in her mother; Kidney disease in her cousin; Mitral valve prolapse in her mother; Pulmonary fibrosis in her father; Uterine cancer in her paternal grandmother.  Prior Rehab/Hospitalizations Has the patient had prior rehab or hospitalizations prior to admission? No  Has the patient had major surgery during 100 days prior to admission? No              Current Medications  Current Facility-Administered Medications:  .  acetaminophen (TYLENOL) tablet 650 mg, 650 mg, Oral, Q4H PRN, 650 mg at 05/25/19 0823 **OR** acetaminophen (TYLENOL) solution 650 mg, 650 mg, Per Tube, Q4H PRN **OR** acetaminophen (TYLENOL) suppository 650 mg, 650 mg, Rectal, Q4H PRN, Smith, Rondell A, MD .  albuterol (PROVENTIL) (2.5 MG/3ML) 0.083% nebulizer solution 2.5 mg, 2.5 mg, Nebulization, Q6H PRN, Tamala Julian, Rondell A, MD, 2.5 mg at 05/25/19 1214 .  aspirin EC tablet 81 mg, 81 mg, Oral, Daily, Rosalin Hawking, MD, 81 mg at 05/25/19 1127 .  atorvastatin (LIPITOR) tablet 80 mg, 80 mg, Oral, q1800, Fuller Plan A, MD, 80 mg at 05/24/19 1729 .  buPROPion (WELLBUTRIN XL) 24 hr tablet 300 mg, 300 mg, Oral, Daily, Smith, Rondell A, MD, 300 mg at 05/25/19 1126 .  citalopram (CELEXA) tablet 20 mg, 20 mg, Oral, Daily, Smith, Rondell A, MD, 20 mg at 05/25/19 1127 .  clopidogrel (PLAVIX) tablet 75 mg, 75 mg, Oral, Daily, Rosalin Hawking, MD, 75 mg at 05/25/19 1127 .  enoxaparin (LOVENOX) injection 40 mg, 40 mg, Subcutaneous, Q24H, Smith, Rondell A, MD, 40 mg at 05/24/19 1727 .  labetalol (NORMODYNE) injection 5 mg, 5 mg, Intravenous, Q2H PRN, Smith, Rondell A, MD .  loratadine (CLARITIN) tablet 10 mg, 10 mg, Oral, Daily, Alekh, Kshitiz, MD, 10 mg at 05/25/19 1128 .  mesalamine (LIALDA) EC tablet 2.4 g, 2.4 g, Oral, BID, Smith, Rondell A, MD, 2.4 g at 05/25/19 1126 .  pantoprazole  (PROTONIX) EC tablet 40 mg, 40 mg, Oral, Daily, Tamala Julian, Rondell A, MD, 40 mg at 05/25/19 1127 .  sodium chloride flush (NS) 0.9 % injection 3 mL, 3 mL, Intravenous, Once, Delo, Douglas, MD .  zolpidem (AMBIEN) tablet 5 mg, 5 mg, Oral, QHS PRN,MR X 1, Smith, Rondell A, MD, 5 mg at 05/24/19 0215  Patients Current Diet:     Diet Order  Diet - low sodium heart healthy         Diet Heart Room service appropriate? Yes; Fluid consistency: Thin  Diet effective now               Precautions / Restrictions Precautions Precautions: Fall, Other (comment) Precaution Comments: L weakness Restrictions Weight Bearing Restrictions: No   Has the patient had 2 or more falls or a fall with injury in the past year? Yes  Prior Activity Level Community (5-7x/wk): worked from home, still driving, raising children  Prior Functional Level Self Care: Did the patient need help bathing, dressing, using the toilet or eating? Independent  Indoor Mobility: Did the patient need assistance with walking from room to room (with or without device)? Independent  Stairs: Did the patient need assistance with internal or external stairs (with or without device)? Independent  Functional Cognition: Did the patient need help planning regular tasks such as shopping or remembering to take medications? Independent  Home Assistive Devices / Equipment Home Assistive Devices/Equipment: None Home Equipment: None  Prior Device Use: Indicate devices/aids used by the patient prior to current illness, exacerbation or injury? None of the above  Current Functional Level Cognition  Arousal/Alertness: Awake/alert Overall Cognitive Status: Within Functional Limits for tasks assessed Orientation Level: Oriented X4 Attention: Focused, Sustained Focused Attention: Appears intact(Vigilance WNL: 1/1) Sustained Attention: Appears intact(Serial 7s: 3/3) Memory: Appears intact(Immediate: 5/5;  delayed: 5/5) Awareness: Appears intact Executive Function: Reasoning, Sequencing, Organizing Reasoning: Appears intact(Abstraction: 2/2) Sequencing: Impaired Sequencing Impairment: Verbal complex(Clock drawing: 2/3) Organizing: Appears intact(Backward digit span: 1/1)    Extremity Assessment (includes Sensation/Coordination)  Upper Extremity Assessment: Generalized weakness, LUE deficits/detail LUE Deficits / Details: 3-/5 MM grade and poor coordination; poor grasp  LUE Coordination: decreased fine motor, decreased gross motor  Lower Extremity Assessment: Defer to PT evaluation RLE Deficits / Details: WNL RLE Sensation: WNL RLE Coordination: WNL LLE Deficits / Details: quad 4+/5, dorsiflexors 4+/5, hip flexor 4/5 LLE Sensation: decreased proprioception LLE Coordination: decreased gross motor, decreased fine motor    ADLs  Overall ADL's : Needs assistance/impaired Eating/Feeding: Set up, Sitting Eating/Feeding Details (indicate cue type and reason): with RUE mostly Grooming: Min guard, Standing, Cueing for safety, Cueing for sequencing Upper Body Bathing: Moderate assistance, Standing Lower Body Bathing: Maximal assistance Upper Body Dressing : Moderate assistance, Standing Lower Body Dressing: Maximal assistance, Sitting/lateral leans, Sit to/from stand Toilet Transfer: Minimal assistance, Ambulation, RW Toileting- Clothing Manipulation and Hygiene: Maximal assistance, Cueing for safety, Cueing for sequencing, Sitting/lateral lean, Sit to/from stand Functional mobility during ADLs: Minimal assistance, Rolling walker, Cueing for safety, Cueing for sequencing General ADL Comments: Pt limited by LUE weakness, poor mobility and decreased ability to care for self.    Mobility  Overal bed mobility: Needs Assistance Bed Mobility: Supine to Sit Supine to sit: Min assist General bed mobility comments: MinA to bring legs around and trunk up to upright    Transfers  Overall  transfer level: Needs assistance Equipment used: Rolling walker (2 wheeled) Transfers: Sit to/from Stand Sit to Stand: Min assist, Min guard, +2 safety/equipment General transfer comment: MinA to minguardA for LLE taking larger steps due to lack of coordination    Ambulation / Gait / Stairs / Emergency planning/management officer  Ambulation/Gait Ambulation/Gait assistance: Min guard, +2 safety/equipment Gait Distance (Feet): 25 Feet(13f + 174fwith standing rest break in between) Assistive device: Rolling walker (2 wheeled) Gait Pattern/deviations: Step-to pattern, Decreased step length - right, Decreased stance time - left, Decreased stride length, Decreased  dorsiflexion - left, Decreased weight shift to left, Trunk flexed General Gait Details: definite loss of proprioception L LE and extended time to walk gait distances, easily fatigued, min guard x2 for safety but could ambulate with min guard of +1 Gait velocity: decreased    Posture / Balance Balance Overall balance assessment: Needs assistance Sitting-balance support: Feet supported, Bilateral upper extremity supported Sitting balance-Leahy Scale: Good Standing balance support: Bilateral upper extremity supported, During functional activity Standing balance-Leahy Scale: Fair Standing balance comment: reliant on external support; support for R hand to stay on RW    Special needs/care consideration BiPAP/CPAP no CPM no Continuous Drip IV no Dialysis no        Days n/a Life Vest no Oxygen no Special Bed no Trach Size no Wound Vac (area) no      Location no Skin intact                              Locati Bowel mgmt: continent, last bbm 9/22 Bladder mgmt: continent Diabetic mgmt: no Behavioral consideration no Chemo/radiation no   Previous Home Environment (from acute therapy documentation) Living Arrangements: Other relatives  Lives With: Family Available Help at Discharge: Family, Available 24 hours/day Type of Home: House Home  Layout: Two level, Bed/bath upstairs Alternate Level Stairs-Rails: Can reach both Alternate Level Stairs-Number of Steps: flight B railings Home Access: Stairs to enter Entrance Stairs-Rails: Can reach both Entrance Stairs-Number of Steps: 6 steps to enter, bedroom on second floor, has bilateral railings on each side Bathroom Shower/Tub: Government social research officer Accessibility: Yes Home Care Services: No Additional Comments: very independent with bathing/dressing/grooming, was driving  Discharge Living Setting Plans for Discharge Living Setting: Patient's home Type of Home at Discharge: House Discharge Home Layout: Two level, Bed/bath upstairs Alternate Level Stairs-Rails: Can reach both Alternate Level Stairs-Number of Steps: full flight Discharge Home Access: Stairs to enter Entrance Stairs-Rails: Can reach both Entrance Stairs-Number of Steps: 6 Discharge Bathroom Shower/Tub: Tub/shower unit Discharge Bathroom Toilet: Standard Discharge Bathroom Accessibility: Yes Does the patient have any problems obtaining your medications?: No  Social/Family/Support Systems Patient Roles: Parent Anticipated Caregiver: mod I goals, pt's sister Coralyn Mark) in the room during assessment and states family can support pt if she does not reach mod I Anticipated Caregiver's Contact Information: Jerrye Noble [phone] Ability/Limitations of Caregiver: works Building control surveyor Availability: 24/7 Discharge Plan Discussed with Primary Caregiver: Yes Is Caregiver In Agreement with Plan?: Yes Does Caregiver/Family have Issues with Lodging/Transportation while Pt is in Rehab?: No  Goals/Additional Needs Patient/Family Goal for Rehab: PT/OT mod I Expected length of stay: 3-5 days Dietary Needs: heart healthy/thin Pt/Family Agrees to Admission and willing to participate: Yes Program Orientation Provided & Reviewed with Pt/Caregiver Including Roles  & Responsibilities: Yes   Decrease burden of Care through IP rehab admission: n/a  Possible need for SNF placement upon discharge: no  Patient Condition: I have reviewed medical records from [facility], spoken with CSW, and patient and family member. I met with patient at the bedside for inpatient rehabilitation assessment.  Patient will benefit from ongoing PT and OT, can actively participate in 3 hours of therapy a day 5 days of the week, and can make measurable gains during the admission.  Patient will also benefit from the coordinated team approach during an Inpatient Acute Rehabilitation admission.  The patient will receive intensive therapy as well as Rehabilitation physician, nursing, social worker, and care management  interventions.  Due to safety, medication administration, pain management and patient education the patient requires 24 hour a day rehabilitation nursing.  The patient is currently min assist with mobility and basic ADLs.  Discharge setting and therapy post discharge at home with home health is anticipated.  Patient has agreed to participate in the Acute Inpatient Rehabilitation Program and will admit today.  Preadmission Screen Completed By:  Michel Santee, PT, DPT 05/25/2019 12:47 PM ______________________________________________________________________   Discussed status with Dr. Naaman Plummer on 05/25/19  at 12:58 PM  and received approval for admission today.  Admission Coordinator:  Michel Santee, PT, DPT  time 12:59 PM Sudie Grumbling 05/25/19    Assessment/Plan: Diagnosis: right CR/IC infarct 1. Does the need for close, 24 hr/day Medical supervision in concert with the patient's rehab needs make it unreasonable for this patient to be served in a less intensive setting? Yes 2. Co-Morbidities requiring supervision/potential complications: HTN, ulcerative colitis 3. Due to bladder management, bowel management, safety, skin/wound care, disease management, medication administration, pain management  and patient education, does the patient require 24 hr/day rehab nursing? Yes 4. Does the patient require coordinated care of a physician, rehab nurse, PT (1-2 hrs/day, 5 days/week) and OT (1-2 hrs/day, 5 days/week) to address physical and functional deficits in the context of the above medical diagnosis(es)? Yes Addressing deficits in the following areas: balance, endurance, locomotion, strength, transferring, bowel/bladder control, bathing, dressing, feeding, grooming, toileting and psychosocial support 5. Can the patient actively participate in an intensive therapy program of at least 3 hrs of therapy 5 days a week? Yes 6. The potential for patient to make measurable gains while on inpatient rehab is excellent 7. Anticipated functional outcomes upon discharge from inpatients are: modified independent PT, modified independent OT, n/a SLP 8. Estimated rehab length of stay to reach the above functional goals is: 3-5 days 9. Anticipated D/C setting: Home 10. Anticipated post D/C treatments: [place] therapy 11. Overall Rehab/Functional Prognosis: excellent  MD Signature: Meredith Staggers, MD, [facility] 05/25/2019         Revision History

## 2019-05-25 NOTE — Progress Notes (Signed)
Patient ID: Carol Johnson, female   DOB: [birth year], 53 y.o.   MRN: [id number]  PROGRESS NOTE    Carol Johnson  JGO:[id number] DOB: 05/26/1966 DOA: 05/23/2019 PCP: Carol Greenspan, MD   Brief Narrative:  53 year old female with history of hypertension, hyperlipidemia, DVT, ulcerative colitis and GERD presented with left sided weakness on 05/23/2019.  CT of the brain was negative for acute intracranial abnormality.  Neurology was consulted.  Patient was out of window for TPA.  MRI/MRA of the head revealed 2.1 x 0.7 cm acute infarct involving the right corona radiata/internal capsule with extension into the right basal ganglia and a possible 2 mm A2 right anterior cerebral artery aneurysm.  Assessment & Plan:   Acute ischemic stroke presenting with left-sided weakness -CT of the brain was negative for acute intracranial abnormality. - Neurology was consulted.   -Patient was out of window for TPA.  MRI/MRA of the head revealed 2.1 x 0.7 cm acute infarct involving the right corona radiata/internal capsule with extension into the right basal ganglia and a possible 2 mm A2 right anterior cerebral artery aneurysm. -Continue neurochecks.  Follow echo and A1c -LDL 166.  Continue Lipitor 80 mg daily -Continue aspirin and Plavix for 3 weeks and aspirin alone as per neurology.  Neurology has signed off.  Outpatient follow-up with neurology. -Patient still feels weak on the left side.  PT recommends CIR.  CIR consulted. -Allow for permissive hypertension -She will need 30-day cardiac monitoring as an outpatient on discharge.  Will notify cardiology.  Ulcerative colitis -Continue mesalamine.  Outpatient follow-up  Essential hypertension -Monitor blood pressure.  Allow for permissive hypertension.  Thrombocytosis -Probably reactive.  Monitor intermittently.  Hyperlipidemia -Continue atorvastatin  Depression with anxiety -Continue Wellbutrin and Celexa  Morbid obesity -Outpatient  follow-up    DVT prophylaxis: Lovenox Code Status: Full Family Communication: None at bedside Disposition Plan: Depends on PT eval  Consultants: Neurology  Procedures: None  Antimicrobials: None  Subjective: Patient seen and examined at bedside.  No overnight fever, nausea or vomiting.  Still feels that her left-sided slightly weak and is hoping to go to CIR today.  Objective: Vitals:   05/24/19 2343 05/25/19 0319 05/25/19 0810 05/25/19 1048  BP: (!) 142/83 127/73 124/67 132/77  Pulse: 100 95 91 91  Resp: (!) 22 18 18 18   Temp: 99.8 F (37.7 C) 98.6 F (37 C) 98.6 F (37 C) 97.7 F (36.5 C)  TempSrc: Oral Oral Oral Oral  SpO2: 98% 94% 95% 95%  Weight:      Height:        Intake/Output Summary (Last 24 hours) at 05/25/2019 1145 Last data filed at 05/25/2019 0318 Gross per 24 hour  Intake 440 ml  Output 300 ml  Net 140 ml   Filed Weights   05/23/19 0849 05/23/19 2338  Weight: 104.3 kg 105.2 kg    Examination:  General exam: No distress. Respiratory system: Bilateral decreased breath sounds at bases, no wheezing Cardiovascular system: Rate controlled, S1-S2 heard Gastrointestinal system: Abdomen is morbidly obese nondistended, soft and nontender. Normal bowel sounds heard. Extremities: No cyanosis, edema   Data Reviewed: I have personally reviewed following labs and imaging studies  CBC: Recent Labs  Lab 05/23/19 0750 05/23/19 0811 05/24/19 0405  WBC 9.6  --  10.7*  NEUTROABS 6.0  --   --   HGB 12.9 14.6 12.5  HCT 42.2 43.0 40.1  MCV 82.1  --  81.2  PLT 469*  --  499*  Basic Metabolic Panel: Recent Labs  Lab 05/23/19 0750 05/23/19 0811 05/24/19 0405  NA 138 140 139  K 4.0 4.1 3.6  CL 104 105 105  CO2 21*  --  21*  GLUCOSE 109* 105* 117*  BUN 8 8 7   CREATININE 0.90 0.70 0.83  CALCIUM 9.7  --  9.8   GFR: Estimated Creatinine Clearance: 89.9 mL/min (by C-G formula based on SCr of 0.83 mg/dL). Liver Function Tests: Recent Labs  Lab  05/23/19 0750  AST 25  ALT 29  ALKPHOS 83  BILITOT 0.6  PROT 7.9  ALBUMIN 3.9   No results for input(s): LIPASE, AMYLASE in the last 168 hours. No results for input(s): AMMONIA in the last 168 hours. Coagulation Profile: Recent Labs  Lab 05/23/19 0750  INR 0.9   Cardiac Enzymes: No results for input(s): CKTOTAL, CKMB, CKMBINDEX, TROPONINI in the last 168 hours. BNP (last 3 results) No results for input(s): PROBNP in the last 8760 hours. HbA1C: Recent Labs    05/24/19 0405  HGBA1C 6.2*   CBG: Recent Labs  Lab 05/23/19 0752  GLUCAP 103*   Lipid Profile: Recent Labs    05/24/19 0405  CHOL 228*  HDL 32*  LDLCALC 166*  TRIG 151*  CHOLHDL 7.1   Thyroid Function Tests: No results for input(s): TSH, T4TOTAL, FREET4, T3FREE, THYROIDAB in the last 72 hours. Anemia Panel: No results for input(s): VITAMINB12, FOLATE, FERRITIN, TIBC, IRON, RETICCTPCT in the last 72 hours. Sepsis Labs: No results for input(s): PROCALCITON, LATICACIDVEN in the last 168 hours.  Recent Results (from the past 240 hour(s))  SARS CORONAVIRUS 2 (TAT 6-24 HRS) Nasopharyngeal Nasopharyngeal Swab     Status: None   Collection Time: 05/23/19  3:30 PM   Specimen: Nasopharyngeal Swab  Result Value Ref Range Status   SARS Coronavirus 2 NEGATIVE NEGATIVE Final    Comment: (NOTE) SARS-CoV-2 target nucleic acids are NOT DETECTED. The SARS-CoV-2 RNA is generally detectable in upper and lower respiratory specimens during the acute phase of infection. Negative results do not preclude SARS-CoV-2 infection, do not rule out co-infections with other pathogens, and should not be used as the sole basis for treatment or other patient management decisions. Negative results must be combined with clinical observations, patient history, and epidemiological information. The expected result is Negative. Fact Sheet for Patients: SugarRoll[employer]be Fact Sheet for Healthcare  Providers: https://www.woods-mathews.com/ This test is not yet approved or cleared by the [place] FDA and  has been authorized for detection and/or diagnosis of SARS-CoV-2 by FDA under an Emergency Use Authorization (EUA). This EUA will remain  in effect (meaning this test can be used) for the duration of the COVID-19 declaration under Section 56 4(b)(1) of the Act, 21 U.S.C. section 360bbb-3(b)(1), unless the authorization is terminated or revoked sooner. Performed at [facility], [place] [address], [place], [place] [address]          Radiology Studies: Mr Angio Head Wo Contrast  Result Date: 05/23/2019 CLINICAL DATA:  Cerebral aneurysm, subarachnoid hemorrhage, cerebral vasospasm, evaluate left-sided weakness, left facial droop. EXAM: MRI HEAD WITHOUT CONTRAST MRA HEAD WITHOUT CONTRAST TECHNIQUE: Multiplanar, multiecho pulse sequences of the brain and surrounding structures were obtained without intravenous contrast. Angiographic images of the head were obtained using MRA technique without contrast. COMPARISON:  Noncontrast head CT 05/23/2019 FINDINGS: MRI HEAD FINDINGS Brain: Multiple sequences are motion degraded. Acute infarct measuring 2.1 x 0.7 cm involving the right corona radiata/internal capsule and extending inferiorly into the right basal ganglia.  Associated T2/FLAIR hyperintensity at this site. Mild scattered T2/FLAIR hyperintensity within the cerebral white matter is nonspecific, but consistent chronic small vessel ischemic disease. No evidence of intracranial mass. No midline shift or extra-axial fluid collection. No chronic intracranial blood products. Cerebral volume is normal for age. Partially empty sella turcica. Vascular: Reported separately Skull and upper cervical spine: Normal marrow signal. Sinuses/Orbits: Open orbits visualized paranasal sinuses are clear. Visualized mastoid air cells are clear. MRA HEAD FINDINGS The examination is motion degraded.  Bilateral ICA siphons patent without high-grade stenosis. Right middle and anterior cerebral arteries patent without high-grade proximal stenosis. Left middle and anterior cerebral arteries patent without high-grade proximal stenosis. Possible 2 mm aneurysm arising from the A2 right anterior cerebral artery on source images (series 3, image 61). However, evaluation is somewhat limited due to motion artifact. Visualized intracranial vertebral arteries are patent without high-grade stenosis. Basilar artery is patent without high-grade stenosis. Bilateral posterior cerebral arteries patent without high-grade proximal stenosis. A right posterior communicating artery is present. Left posterior communicating is poorly delineated and may be hypoplastic or absent. These results were called by telephone at the time of interpretation on 05/23/2019 at 3:09 pm to provider Dr. Tyrone Nine, Who verbally acknowledged these results. IMPRESSION: MRI brain: 1. Motion degraded examination. 2. 2.1 x 0.7 cm acute infarct involving right corona radiata/internal capsule and extending into the right basal ganglia. 3. Mild chronic small vessel ischemic disease. MRA head: 1. Motion degraded examination. 2. No evidence of intracranial large vessel occlusion or high-grade proximal arterial stenosis. 3. A 2 mm aneurysm arising the A2 right anterior cerebral artery is questioned, although evaluation is somewhat limited due to motion degradation. Consider follow-up CT or MR angiography. Electronically Signed   By: Kellie Simmering   On: 05/23/2019 15:10   Mr Angio Neck W Wo Contrast  Result Date: 05/23/2019 CLINICAL DATA:  Initial evaluation for acute stroke. EXAM: MRA NECK WITHOUT AND WITH CONTRAST TECHNIQUE: Multiplanar and multiecho pulse sequences of the neck were obtained without and with intravenous contrast. Angiographic images of the neck were obtained using MRA technique without and with intravenous contrast. CONTRAST:  53m GADAVIST GADOBUTROL  1 MMOL/ML IV SOLN COMPARISON:  Prior MRI and brain MRA from earlier the same day. FINDINGS: Examination mildly limited by motion artifact. Source images reviewed. AORTIC ARCH: Visualized aortic arch of normal caliber with normal branch pattern. No hemodynamically significant stenosis seen about the origin of the great vessels. Visualized subclavian arteries widely patent. RIGHT CAROTID SYSTEM: Evaluation of the proximal right CCA mildly limited by motion. Visualized right CCA patent to the bifurcation without stenosis. No significant atheromatous narrowing seen about the right bifurcation. Right ICA mildly tortuous but widely patent distally to the circle-of-Willis without stenosis or occlusion. LEFT CAROTID SYSTEM: Left CCA patent to the bifurcation without stenosis. Evaluation of the left bifurcation mildly limited by motion, with no definite significant stenosis seen. Left ICA mildly tortuous but widely patent distally to the circle-of-Willis without stenosis or occlusion. VERTEBRAL ARTERIES: Both vertebral arteries arise from the subclavian arteries. Vertebral arteries widely patent within the neck without stenosis or vascular occlusion. IMPRESSION: Negative MRA of the neck. No hemodynamically significant or critical flow limiting stenosis. Electronically Signed   By: BJeannine BogaM.D.   On: 05/23/2019 20:11   Mr Brain Wo Contrast (neuro Protocol)  Result Date: 05/23/2019 CLINICAL DATA:  Cerebral aneurysm, subarachnoid hemorrhage, cerebral vasospasm, evaluate left-sided weakness, left facial droop. EXAM: MRI HEAD WITHOUT CONTRAST MRA HEAD WITHOUT CONTRAST TECHNIQUE: Multiplanar, multiecho  pulse sequences of the brain and surrounding structures were obtained without intravenous contrast. Angiographic images of the head were obtained using MRA technique without contrast. COMPARISON:  Noncontrast head CT 05/23/2019 FINDINGS: MRI HEAD FINDINGS Brain: Multiple sequences are motion degraded. Acute infarct  measuring 2.1 x 0.7 cm involving the right corona radiata/internal capsule and extending inferiorly into the right basal ganglia. Associated T2/FLAIR hyperintensity at this site. Mild scattered T2/FLAIR hyperintensity within the cerebral white matter is nonspecific, but consistent chronic small vessel ischemic disease. No evidence of intracranial mass. No midline shift or extra-axial fluid collection. No chronic intracranial blood products. Cerebral volume is normal for age. Partially empty sella turcica. Vascular: Reported separately Skull and upper cervical spine: Normal marrow signal. Sinuses/Orbits: Open orbits visualized paranasal sinuses are clear. Visualized mastoid air cells are clear. MRA HEAD FINDINGS The examination is motion degraded. Bilateral ICA siphons patent without high-grade stenosis. Right middle and anterior cerebral arteries patent without high-grade proximal stenosis. Left middle and anterior cerebral arteries patent without high-grade proximal stenosis. Possible 2 mm aneurysm arising from the A2 right anterior cerebral artery on source images (series 3, image 61). However, evaluation is somewhat limited due to motion artifact. Visualized intracranial vertebral arteries are patent without high-grade stenosis. Basilar artery is patent without high-grade stenosis. Bilateral posterior cerebral arteries patent without high-grade proximal stenosis. A right posterior communicating artery is present. Left posterior communicating is poorly delineated and may be hypoplastic or absent. These results were called by telephone at the time of interpretation on 05/23/2019 at 3:09 pm to provider Dr. Tyrone Nine, Who verbally acknowledged these results. IMPRESSION: MRI brain: 1. Motion degraded examination. 2. 2.1 x 0.7 cm acute infarct involving right corona radiata/internal capsule and extending into the right basal ganglia. 3. Mild chronic small vessel ischemic disease. MRA head: 1. Motion degraded examination. 2.  No evidence of intracranial large vessel occlusion or high-grade proximal arterial stenosis. 3. A 2 mm aneurysm arising the A2 right anterior cerebral artery is questioned, although evaluation is somewhat limited due to motion degradation. Consider follow-up CT or MR angiography. Electronically Signed   By: Kellie Simmering   On: 05/23/2019 15:10   Dg Chest Port 1 View  Result Date: 05/23/2019 CLINICAL DATA:  Hemiparesis, shortness of breath EXAM: PORTABLE CHEST 1 VIEW COMPARISON:  None. FINDINGS: The heart size and mediastinal contours are within normal limits. Both lungs are clear. The visualized skeletal structures are unremarkable. IMPRESSION: No active disease. Electronically Signed   By: Zetta Bills M.D.   On: 05/23/2019 16:49        Scheduled Meds:  aspirin EC  81 mg Oral Daily   atorvastatin  80 mg Oral q1800   buPROPion  300 mg Oral Daily   citalopram  20 mg Oral Daily   clopidogrel  75 mg Oral Daily   enoxaparin (LOVENOX) injection  40 mg Subcutaneous Q24H   influenza vac split quadrivalent PF  0.5 mL Intramuscular Tomorrow-1000   loratadine  10 mg Oral Daily   mesalamine  2.4 g Oral BID   pantoprazole  40 mg Oral Daily   sodium chloride flush  3 mL Intravenous Once   Continuous Infusions:   LOS: 2 days        Aline August, MD [facility] 05/25/2019, 11:45 AM

## 2019-05-25 NOTE — Progress Notes (Signed)
Pt for transfer to Rehab now.  Telemetry discontinued report given.  Patient comfortable denies any discomfort upon discharge.

## 2019-05-25 NOTE — H&P (Signed)
Physical Medicine and Rehabilitation Admission H&P    Chief Complaint  Patient presents with   Weakness  : HPI: Carol Johnson is a 53 year old right-handed female history of hypertension, ulcerative colitis, B12 deficiency, DVT 2016 left posterior tibial vein contributed to estrogen use and smoking she was on Xarelto and has been since completed, hyperlipidemia, asthma, and quit smoking 17 months ago..  Per chart review patient independent prior to admission to level home with bed and bath upstairs 6 steps to entry.  She lives with family in good support.  Presented 05/23/2019 with left-sided weakness.  Cranial CT scan negative.  MRI showed a 2 cm acute infarct right corona radiata internal capsule extending into the right basal ganglia.  MRA head with no LVO but concerning for possible 2 mm aneurysm from right A2 division of the anterior cerebral artery.  MRA of neck unremarkable for any acute occlusion.  Echocardiogram with ejection fraction of 65%.  Neurology follow-up placed on aspirin and Plavix x3 weeks then aspirin alone.  Subcutaneous Lovenox for DVT prophylaxis.  Recommendations for 30-day cardiac event monitor as outpatient.  Tolerating a regular consistency diet.  Therapy evaluations completed and patient was admitted for a comprehensive rehab program  Review of Systems  Constitutional: Negative for chills and fever.  HENT: Negative for hearing loss.   Eyes: Negative for blurred vision and double vision.  Respiratory: Negative for cough and shortness of breath.   Cardiovascular: Negative for chest pain, palpitations and leg swelling.  Gastrointestinal: Positive for diarrhea. Negative for heartburn, nausea and vomiting.       GERD  Genitourinary: Negative for dysuria, flank pain and hematuria.  Musculoskeletal: Positive for myalgias. Negative for back pain.  Skin: Negative for rash.  Neurological: Positive for weakness and headaches.  Psychiatric/Behavioral: The patient has  insomnia.        Anxiety  All other systems reviewed and are negative.  Past Medical History:  Diagnosis Date   Abdominal pain, unspecified site    Allergic rhinitis    Anxiety    Asthma    Depression    DVT (deep venous thrombosis) ([place]) 2016   Fibroid uterus    GERD (gastroesophageal reflux disease)    Hypercholesterolemia    Hypertension    Internal hemorrhoid    Irritable bowel syndrome    Lymphocytic colitis 01/02/2015   Pneumonia 1998   Ulcerative colitis ([place])    URI (upper respiratory infection)    Uterine fibroid    Vitamin B12 deficiency    Past Surgical History:  Procedure Laterality Date   CESAREAN SECTION  2004   CHOLECYSTECTOMY     laparoscopic "gallstones"   ESOPHAGOGASTRODUODENOSCOPY (EGD) WITH PROPOFOL N/A 05/03/2014   Procedure: ESOPHAGOGASTRODUODENOSCOPY (EGD) WITH PROPOFOL;  Surgeon: Inda Castle, MD;  Location: [facility];  Service: Endoscopy;  Laterality: N/A;   MYOMECTOMY     UTERINE FIBROID SURGERY     Family History  Problem Relation Age of Onset   Pulmonary fibrosis Father    Colon cancer Paternal Aunt    Uterine cancer Paternal Grandmother        with mets to the colon    Heart failure Maternal Grandmother    Heart disease Paternal Uncle    Heart disease Paternal Aunt    Kidney disease Cousin    Hyperlipidemia Mother    Mitral valve prolapse Mother    Heart disease Mother        arrhythmia   Social History:  reports that  she quit smoking about 17 months ago. Her smoking use included cigarettes. She has a 1.00 pack-year smoking history. She has never used smokeless tobacco. She reports current alcohol use of about 5.0 standard drinks of alcohol per week. She reports that she does not use drugs. Allergies:  Allergies  Allergen Reactions   Sertraline Hcl     REACTION: diarrhea   Zoloft [Sertraline Hcl] Diarrhea   Medications Prior to Admission  Medication Sig Dispense Refill   buPROPion  (WELLBUTRIN XL) 300 MG 24 hr tablet Take 1 tablet (300 mg total) by mouth daily. 90 tablet 3   citalopram (CELEXA) 20 MG tablet Take 1 tablet (20 mg total) by mouth daily. 90 tablet 3   fexofenadine (ALLEGRA) 180 MG tablet Take 180 mg by mouth daily as needed.      hydrochlorothiazide (MICROZIDE) 12.5 MG capsule Take 1 capsule (12.5 mg total) by mouth daily. 90 capsule 3   losartan (COZAAR) 25 MG tablet Take 1 tablet (25 mg total) by mouth daily. Please make overdue appt with Dr. Saunders Revel before anymore refills. 2nd attempt (Patient taking differently: Take 25 mg by mouth daily. ) 15 tablet 0   meloxicam (MOBIC) 15 MG tablet TAKE 1 TABLET (15 MG TOTAL) BY MOUTH DAILY AS NEEDED FOR PAIN WITH FOOD (Patient taking differently: Take 15 mg by mouth as needed for pain. ) 30 tablet 3   mesalamine (LIALDA) 1.2 g EC tablet TAKE 2 TABLETS (2.4 G TOTAL) BY MOUTH 2 (TWO) TIMES DAILY. 60 tablet 2   pantoprazole (PROTONIX) 40 MG tablet Take 1 tablet (40 mg total) by mouth daily. 90 tablet 3   zolpidem (AMBIEN CR) 12.5 MG CR tablet TAKE 1 TABLET (12.5 MG TOTAL) BY MOUTH AT BEDTIME AS NEEDED FOR SLEEP (Patient taking differently: Take 2.25-12.5 mg by mouth at bedtime as needed for sleep. ) 30 tablet 3   albuterol (PROVENTIL HFA;VENTOLIN HFA) 108 (90 Base) MCG/ACT inhaler Inhale 2 puffs into the lungs every 4 (four) hours as needed for wheezing or shortness of breath (cough, shortness of breath or wheezing.). (Patient not taking: Reported on 05/23/2019) 1 Inhaler 1   atorvastatin (LIPITOR) 10 MG tablet Take 1 tablet (10 mg total) daily by mouth. (Patient not taking: Reported on 05/23/2019) 30 tablet 11   fluticasone (FLONASE) 50 MCG/ACT nasal spray Place 2 sprays into both nostrils daily. (Patient not taking: Reported on 05/23/2019) 16 g 6   metoprolol succinate (TOPROL-XL) 25 MG 24 hr tablet Take 1 tablet (25 mg total) by mouth daily. Take with or immediately following a meal. (Patient not taking: Reported on  05/23/2019) 90 tablet 3   predniSONE (DELTASONE) 20 MG tablet Take 1 tablet (20 mg total) by mouth daily with breakfast. (Patient not taking: Reported on 05/23/2019) 10 tablet 0    Drug Regimen Review Drug regimen was reviewed and remains appropriate with no significant issues identified  Home: Home Living Family/patient expects to be discharged to:: Private residence Living Arrangements: Other relatives Available Help at Discharge: Family, Available 24 hours/day Type of Home: House Home Access: Stairs to enter [employer] of Steps: 6 steps to enter, bedroom on second floor, has bilateral railings on each side Entrance Stairs-Rails: Can reach both Home Layout: Two level, Bed/bath upstairs Alternate Level Stairs-Number of Steps: flight B railings Alternate Level Stairs-Rails: Can reach both Bathroom Shower/Tub: Chiropodist: Standard Bathroom Accessibility: Yes Home Equipment: None Additional Comments: very independent with bathing/dressing/grooming, was driving  Lives With: Family   Functional History:  Prior Function Level of Independence: Independent  Functional Status:  Mobility: Bed Mobility Overal bed mobility: Needs Assistance Bed Mobility: Supine to Sit Supine to sit: Min assist General bed mobility comments: MinA to bring legs around and trunk up to upright Transfers Overall transfer level: Needs assistance Equipment used: Rolling walker (2 wheeled) Transfers: Sit to/from Stand Sit to Stand: Min assist, Min guard, +2 safety/equipment General transfer comment: MinA to minguardA for LLE taking larger steps due to lack of coordination Ambulation/Gait Ambulation/Gait assistance: Min guard, +2 safety/equipment Gait Distance (Feet): 25 Feet(55f + 169fwith standing rest break in between) Assistive device: Rolling walker (2 wheeled) Gait Pattern/deviations: Step-to pattern, Decreased step length - right, Decreased stance time - left,  Decreased stride length, Decreased dorsiflexion - left, Decreased weight shift to left, Trunk flexed General Gait Details: definite loss of proprioception L LE and extended time to walk gait distances, easily fatigued, min guard x2 for safety but could ambulate with min guard of +1 Gait velocity: decreased    ADL: ADL Overall ADL's : Needs assistance/impaired Eating/Feeding: Set up, Sitting Eating/Feeding Details (indicate cue type and reason): with RUE mostly Grooming: Min guard, Standing, Cueing for safety, Cueing for sequencing Upper Body Bathing: Moderate assistance, Standing Lower Body Bathing: Maximal assistance Upper Body Dressing : Moderate assistance, Standing Lower Body Dressing: Maximal assistance, Sitting/lateral leans, Sit to/from stand Toilet Transfer: Minimal assistance, Ambulation, RW Toileting- Clothing Manipulation and Hygiene: Maximal assistance, Cueing for safety, Cueing for sequencing, Sitting/lateral lean, Sit to/from stand Functional mobility during ADLs: Minimal assistance, Rolling walker, Cueing for safety, Cueing for sequencing General ADL Comments: Pt limited by LUE weakness, poor mobility and decreased ability to care for self.  Cognition: Cognition Overall Cognitive Status: Within Functional Limits for tasks assessed Arousal/Alertness: Awake/alert Orientation Level: Oriented X4 Attention: Focused, Sustained Focused Attention: Appears intact(Vigilance WNL: 1/1) Sustained Attention: Appears intact(Serial 7s: 3/3) Memory: Appears intact(Immediate: 5/5; delayed: 5/5) Awareness: Appears intact Executive Function: Reasoning, Sequencing, Organizing Reasoning: Appears intact(Abstraction: 2/2) Sequencing: Impaired Sequencing Impairment: Verbal complex(Clock drawing: 2/3) Organizing: Appears intact(Backward digit span: 1/1) Cognition Arousal/Alertness: Awake/alert Behavior During Therapy: Flat affect Overall Cognitive Status: Within Functional Limits for tasks  assessed  Physical Exam: Blood pressure 132/77, pulse 91, temperature 97.7 F (36.5 C), temperature source Oral, resp. rate 18, height 5' 3"  (1.6 m), weight 105.2 kg, last menstrual period 11/29/2015, SpO2 95 %. Physical Exam  Constitutional: She is oriented to person, place, and time. She appears well-developed and well-nourished. She appears distressed.  obese  HENT:  Head: Normocephalic and atraumatic.  Eyes: Pupils are equal, round, and reactive to light. EOM are normal.  Neck: Normal range of motion. No thyromegaly present.  Cardiovascular: Normal rate and regular rhythm. Exam reveals no friction rub.  No murmur heard. Respiratory: Effort normal. No respiratory distress. She has no wheezes.  GI: Soft. There is no abdominal tenderness.  Musculoskeletal: Normal range of motion.        General: No edema.  Neurological: She is alert and oriented to person, place, and time.  Normal insight and awareness. Functional memory and language. Left central 7 and left facial sensory loss. RUE and RLE 5/5. LUE 4/5 prox to 4-/5 distally. LLE 3+ to 4/ HF, KE, APF, ADF 3/5. DTR's 3+ left. Occasional catch left finger flexors. Sensory 1/2 LUE and LLE.   Skin: Skin is warm and dry.  Psychiatric: She has a normal mood and affect. Her behavior is normal. Judgment and thought content normal.    Results for orders  placed or performed during the hospital encounter of 05/23/19 (from the past 48 hour(s))  SARS CORONAVIRUS 2 (TAT 6-24 HRS) Nasopharyngeal Nasopharyngeal Swab     Status: None   Collection Time: 05/23/19  3:30 PM   Specimen: Nasopharyngeal Swab  Result Value Ref Range   SARS Coronavirus 2 NEGATIVE NEGATIVE    Comment: (NOTE) SARS-CoV-2 target nucleic acids are NOT DETECTED. The SARS-CoV-2 RNA is generally detectable in upper and lower respiratory specimens during the acute phase of infection. Negative results do not preclude SARS-CoV-2 infection, do not rule out co-infections with other  pathogens, and should not be used as the sole basis for treatment or other patient management decisions. Negative results must be combined with clinical observations, patient history, and epidemiological information. The expected result is Negative. Fact Sheet for Patients: SugarRoll.be Fact Sheet for Healthcare Providers: https://www.woods-mathews.com/ This test is not yet approved or cleared by the [place] FDA and  has been authorized for detection and/or diagnosis of SARS-CoV-2 by FDA under an Emergency Use Authorization (EUA). This EUA will remain  in effect (meaning this test can be used) for the duration of the COVID-19 declaration under Section 56 4(b)(1) of the Act, 21 U.S.C. section 360bbb-3(b)(1), unless the authorization is terminated or revoked sooner. Performed at [facility], [place] [address], [place], [place] [address]   Hemoglobin A1c     Status: Abnormal   Collection Time: 05/24/19  4:05 AM  Result Value Ref Range   Hgb A1c MFr Bld 6.2 (H) 4.8 - 5.6 %    Comment: (NOTE) Pre diabetes:          5.7%-6.4% Diabetes:              >6.4% Glycemic control for   <7.0% adults with diabetes    Mean Plasma Glucose 131.24 mg/dL    Comment: Performed at [facility] [address] [place], [place] [address]  Lipid panel     Status: Abnormal   Collection Time: 05/24/19  4:05 AM  Result Value Ref Range   Cholesterol 228 (H) 0 - 200 mg/dL   Triglycerides 151 (H) <150 mg/dL   HDL 32 (L) >40 mg/dL   Total CHOL/HDL Ratio 7.1 RATIO   VLDL 30 0 - 40 mg/dL   LDL Cholesterol 166 (H) 0 - 99 mg/dL    Comment:        Total Cholesterol/HDL:CHD Risk Coronary Heart Disease Risk Table                     Men   Women  1/2 Average Risk   3.4   3.3  Average Risk       5.0   4.4  2 X Average Risk   9.6   7.1  3 X Average Risk  23.4   11.0        Use the calculated Patient Ratio above and the CHD Risk Table to determine the  patient's CHD Risk.        ATP III CLASSIFICATION (LDL):  <100     mg/dL   Optimal  100-129  mg/dL   Near or Above                    Optimal  130-159  mg/dL   Borderline  160-189  mg/dL   High  >190     mg/dL   Very High Performed at [facility] [address] [place], [place]  [address]   CBC     Status: Abnormal   Collection Time: 05/24/19  4:05 AM  Result Value Ref Range   WBC 10.7 (H) 4.0 - 10.5 K/uL   RBC 4.94 3.87 - 5.11 MIL/uL   Hemoglobin 12.5 12.0 - 15.0 g/dL   HCT 40.1 36.0 - 46.0 %   MCV 81.2 80.0 - 100.0 fL   MCH 25.3 (L) 26.0 - 34.0 pg   MCHC 31.2 30.0 - 36.0 g/dL   RDW 15.0 11.5 - 15.5 %   Platelets 499 (H) 150 - 400 K/uL   nRBC 0.0 0.0 - 0.2 %    Comment: Performed at [facility], [place] [address], [place], [place] [address]  Basic metabolic panel     Status: Abnormal   Collection Time: 05/24/19  4:05 AM  Result Value Ref Range   Sodium 139 135 - 145 mmol/L   Potassium 3.6 3.5 - 5.1 mmol/L   Chloride 105 98 - 111 mmol/L   CO2 21 (L) 22 - 32 mmol/L   Glucose, Bld 117 (H) 70 - 99 mg/dL   BUN 7 6 - 20 mg/dL   Creatinine, Ser 0.83 0.44 - 1.00 mg/dL   Calcium 9.8 8.9 - 10.3 mg/dL   GFR calc non Af Amer >60 >60 mL/min   GFR calc Af Amer >60 >60 mL/min   Anion gap 13 5 - 15    Comment: Performed at [facility], [place] [address] [place], [place] [address]  HIV Antibody  (Routine Testing)     Status: None   Collection Time: 05/24/19  4:05 AM  Result Value Ref Range   HIV Screen 4th Generation wRfx Non Reactive Non Reactive    Comment: (NOTE) Performed At: [facility] [place], [place] [id number] Rush Farmer MD VZ:[id number]    Mr Angio Head Wo Contrast  Result Date: 05/23/2019 CLINICAL DATA:  Cerebral aneurysm, subarachnoid hemorrhage, cerebral vasospasm, evaluate left-sided weakness, left facial droop. EXAM: MRI HEAD WITHOUT CONTRAST MRA HEAD WITHOUT CONTRAST TECHNIQUE: Multiplanar, multiecho pulse sequences of  the brain and surrounding structures were obtained without intravenous contrast. Angiographic images of the head were obtained using MRA technique without contrast. COMPARISON:  Noncontrast head CT 05/23/2019 FINDINGS: MRI HEAD FINDINGS Brain: Multiple sequences are motion degraded. Acute infarct measuring 2.1 x 0.7 cm involving the right corona radiata/internal capsule and extending inferiorly into the right basal ganglia. Associated T2/FLAIR hyperintensity at this site. Mild scattered T2/FLAIR hyperintensity within the cerebral white matter is nonspecific, but consistent chronic small vessel ischemic disease. No evidence of intracranial mass. No midline shift or extra-axial fluid collection. No chronic intracranial blood products. Cerebral volume is normal for age. Partially empty sella turcica. Vascular: Reported separately Skull and upper cervical spine: Normal marrow signal. Sinuses/Orbits: Open orbits visualized paranasal sinuses are clear. Visualized mastoid air cells are clear. MRA HEAD FINDINGS The examination is motion degraded. Bilateral ICA siphons patent without high-grade stenosis. Right middle and anterior cerebral arteries patent without high-grade proximal stenosis. Left middle and anterior cerebral arteries patent without high-grade proximal stenosis. Possible 2 mm aneurysm arising from the A2 right anterior cerebral artery on source images (series 3, image 61). However, evaluation is somewhat limited due to motion artifact. Visualized intracranial vertebral arteries are patent without high-grade stenosis. Basilar artery is patent without high-grade stenosis. Bilateral posterior cerebral arteries patent without high-grade proximal stenosis. A right posterior communicating artery is present. Left posterior communicating is poorly delineated and may be hypoplastic or  absent. These results were called by telephone at the time of interpretation on 05/23/2019 at 3:09 pm to provider Dr. Tyrone Nine, Who  verbally acknowledged these results. IMPRESSION: MRI brain: 1. Motion degraded examination. 2. 2.1 x 0.7 cm acute infarct involving right corona radiata/internal capsule and extending into the right basal ganglia. 3. Mild chronic small vessel ischemic disease. MRA head: 1. Motion degraded examination. 2. No evidence of intracranial large vessel occlusion or high-grade proximal arterial stenosis. 3. A 2 mm aneurysm arising the A2 right anterior cerebral artery is questioned, although evaluation is somewhat limited due to motion degradation. Consider follow-up CT or MR angiography. Electronically Signed   By: Kellie Simmering   On: 05/23/2019 15:10   Mr Angio Neck W Wo Contrast  Result Date: 05/23/2019 CLINICAL DATA:  Initial evaluation for acute stroke. EXAM: MRA NECK WITHOUT AND WITH CONTRAST TECHNIQUE: Multiplanar and multiecho pulse sequences of the neck were obtained without and with intravenous contrast. Angiographic images of the neck were obtained using MRA technique without and with intravenous contrast. CONTRAST:  59m GADAVIST GADOBUTROL 1 MMOL/ML IV SOLN COMPARISON:  Prior MRI and brain MRA from earlier the same day. FINDINGS: Examination mildly limited by motion artifact. Source images reviewed. AORTIC ARCH: Visualized aortic arch of normal caliber with normal branch pattern. No hemodynamically significant stenosis seen about the origin of the great vessels. Visualized subclavian arteries widely patent. RIGHT CAROTID SYSTEM: Evaluation of the proximal right CCA mildly limited by motion. Visualized right CCA patent to the bifurcation without stenosis. No significant atheromatous narrowing seen about the right bifurcation. Right ICA mildly tortuous but widely patent distally to the circle-of-Willis without stenosis or occlusion. LEFT CAROTID SYSTEM: Left CCA patent to the bifurcation without stenosis. Evaluation of the left bifurcation mildly limited by motion, with no definite significant stenosis seen.  Left ICA mildly tortuous but widely patent distally to the circle-of-Willis without stenosis or occlusion. VERTEBRAL ARTERIES: Both vertebral arteries arise from the subclavian arteries. Vertebral arteries widely patent within the neck without stenosis or vascular occlusion. IMPRESSION: Negative MRA of the neck. No hemodynamically significant or critical flow limiting stenosis. Electronically Signed   By: BJeannine BogaM.D.   On: 05/23/2019 20:11   Mr Brain Wo Contrast (neuro Protocol)  Result Date: 05/23/2019 CLINICAL DATA:  Cerebral aneurysm, subarachnoid hemorrhage, cerebral vasospasm, evaluate left-sided weakness, left facial droop. EXAM: MRI HEAD WITHOUT CONTRAST MRA HEAD WITHOUT CONTRAST TECHNIQUE: Multiplanar, multiecho pulse sequences of the brain and surrounding structures were obtained without intravenous contrast. Angiographic images of the head were obtained using MRA technique without contrast. COMPARISON:  Noncontrast head CT 05/23/2019 FINDINGS: MRI HEAD FINDINGS Brain: Multiple sequences are motion degraded. Acute infarct measuring 2.1 x 0.7 cm involving the right corona radiata/internal capsule and extending inferiorly into the right basal ganglia. Associated T2/FLAIR hyperintensity at this site. Mild scattered T2/FLAIR hyperintensity within the cerebral white matter is nonspecific, but consistent chronic small vessel ischemic disease. No evidence of intracranial mass. No midline shift or extra-axial fluid collection. No chronic intracranial blood products. Cerebral volume is normal for age. Partially empty sella turcica. Vascular: Reported separately Skull and upper cervical spine: Normal marrow signal. Sinuses/Orbits: Open orbits visualized paranasal sinuses are clear. Visualized mastoid air cells are clear. MRA HEAD FINDINGS The examination is motion degraded. Bilateral ICA siphons patent without high-grade stenosis. Right middle and anterior cerebral arteries patent without high-grade  proximal stenosis. Left middle and anterior cerebral arteries patent without high-grade proximal stenosis. Possible 2 mm aneurysm arising from the A2  right anterior cerebral artery on source images (series 3, image 61). However, evaluation is somewhat limited due to motion artifact. Visualized intracranial vertebral arteries are patent without high-grade stenosis. Basilar artery is patent without high-grade stenosis. Bilateral posterior cerebral arteries patent without high-grade proximal stenosis. A right posterior communicating artery is present. Left posterior communicating is poorly delineated and may be hypoplastic or absent. These results were called by telephone at the time of interpretation on 05/23/2019 at 3:09 pm to provider Dr. Tyrone Nine, Who verbally acknowledged these results. IMPRESSION: MRI brain: 1. Motion degraded examination. 2. 2.1 x 0.7 cm acute infarct involving right corona radiata/internal capsule and extending into the right basal ganglia. 3. Mild chronic small vessel ischemic disease. MRA head: 1. Motion degraded examination. 2. No evidence of intracranial large vessel occlusion or high-grade proximal arterial stenosis. 3. A 2 mm aneurysm arising the A2 right anterior cerebral artery is questioned, although evaluation is somewhat limited due to motion degradation. Consider follow-up CT or MR angiography. Electronically Signed   By: Kellie Simmering   On: 05/23/2019 15:10   Dg Chest Port 1 View  Result Date: 05/23/2019 CLINICAL DATA:  Hemiparesis, shortness of breath EXAM: PORTABLE CHEST 1 VIEW COMPARISON:  None. FINDINGS: The heart size and mediastinal contours are within normal limits. Both lungs are clear. The visualized skeletal structures are unremarkable. IMPRESSION: No active disease. Electronically Signed   By: Zetta Bills M.D.   On: 05/23/2019 16:49       Medical Problem List and Plan: 1.  Left-sided weakness and hemi-sensory loss secondary to right posterior corona radiata/PL  IC infarction likely secondary small vessel disease.  Plan 30-day event monitor as outpatient  -admit to inpatient rehab  -pt reports ?new sensory loss left face/peri-oral. Given SV stroke and symptoms in left arm and leg, this may represent evolution of the infarct. Reassured patient and told her we would observe this weekend.   2.  Antithrombotics: -DVT/anticoagulation: Lovenox  -antiplatelet therapy: Aspirin 81 mg daily, Plavix 75 mg daily x3 weeks then aspirin alone 3. Pain Management: Tylenol as needed 4. Mood: Celexa 20 mg daily, Wellbutrin 300 mg daily  -antipsychotic agents: N/A 5. Neuropsych: This patient is capable of making decisions on her own behalf. 6. Skin/Wound Care: Routine skin checks 7. Fluids/Electrolytes/Nutrition: Routine in and outs with follow-up chemistries 8.  Ulcerative colitis.Lialda 2.4 mg twice daily  -hasn't had a bm for >day which is unusual for her. 9.  Hyperlipidemia.  Lipitor 10.  History of asthma and remote tobacco abuse.  Albuterol nebulizer as needed.     Lavon Paganini Angiulli, PA-C 05/25/2019

## 2019-05-25 NOTE — Discharge Summary (Signed)
Physician Discharge Summary  Carol Johnson JOI:[id number] DOB: 1965-12-16 DOA: 05/23/2019  PCP: Abner Greenspan, MD  Admit date: 05/23/2019 Discharge date: 05/25/2019  Admitted From: Home Disposition: CIR  Recommendations for Outpatient Follow-up:  1. Follow up with CIR provider on discharge 2. Outpatient follow-up with neurology 3. Follow up in ED if symptoms worsen or new appear   Home Health: No Equipment/Devices: None  Discharge Condition: Stable CODE STATUS: Full Diet recommendation: Heart healthy  Brief/Interim Summary: 53 year old female with history of hypertension, hyperlipidemia, DVT, ulcerative colitis and GERD presented with left sided weakness on 05/23/2019.  CT of the brain was negative for acute intracranial abnormality.  Neurology was consulted.  Patient was out of window for TPA.  MRI/MRA of the head revealed 2.1 x 0.7 cm acute infarct involving the right corona radiata/internal capsule with extension into the right basal ganglia and a possible 2 mm A2 right anterior cerebral artery aneurysm.  Patient was started on aspirin, Plavix and Lipitor.  She will need 30-day cardiac monitoring as an outpatient and discharged.  Cardiology has been notified.  She will be discharged to CIR once bed is available.  Discharge Diagnoses:  Acute ischemic stroke presenting with left-sided weakness -CT of the brain was negative for acute intracranial abnormality. - Neurology was consulted.   -Patient was out of window for TPA.  MRI/MRA of the head revealed 2.1 x 0.7 cm acute infarct involving the right corona radiata/internal capsule with extension into the right basal ganglia and a possible 2 mm A2 right anterior cerebral artery aneurysm. -Continue neurochecks.    A1c 6.2.  Echo showed EF of 60 to 65%. -LDL 166.  Continue Lipitor 80 mg daily -Continue aspirin and Plavix for 3 weeks and then aspirin alone as per neurology.  Neurology has signed off.  Outpatient follow-up with  neurology. -Patient still feels weak on the left side.  PT recommends CIR.   -Allow for permissive hypertension -She will need 30-day cardiac monitoring as an outpatient on discharge.  Will notify cardiology. -Discharge to CIR once bed is available  Ulcerative colitis -Continue mesalamine.  Outpatient follow-up  Essential hypertension -Monitor blood pressure.  Allow for permissive hypertension.  Thrombocytosis -Probably reactive.  Monitor intermittently.  Hyperlipidemia -Continue atorvastatin  Depression with anxiety -Continue Wellbutrin and Celexa  Morbid obesity -Outpatient follow-up  Discharge Instructions  Discharge Instructions    Ambulatory referral to Neurology   Complete by: As directed    Follow up in stroke clinic at [facility] with Frann Rider, NP in about 4 weeks. If not available, consider Dr. Antony Contras, Dr. Bess Harvest, or Dr. Sarina Ill.   Diet - low sodium heart healthy   Complete by: As directed    Increase activity slowly   Complete by: As directed      Allergies as of 05/25/2019      Reactions   Sertraline Hcl    REACTION: diarrhea   Zoloft [sertraline Hcl] Diarrhea      Medication List    STOP taking these medications   hydrochlorothiazide 12.5 MG capsule Commonly known as: Microzide   losartan 25 MG tablet Commonly known as: COZAAR   metoprolol succinate 25 MG 24 hr tablet Commonly known as: TOPROL-XL   predniSONE 20 MG tablet Commonly known as: DELTASONE     TAKE these medications   albuterol 108 (90 Base) MCG/ACT inhaler Commonly known as: VENTOLIN HFA Inhale 2 puffs into the lungs every 4 (four) hours as needed for wheezing or shortness  of breath (cough, shortness of breath or wheezing.).   aspirin 81 MG EC tablet Take 1 tablet (81 mg total) by mouth daily.   atorvastatin 80 MG tablet Commonly known as: LIPITOR Take 1 tablet (80 mg total) by mouth daily at 6 PM. What changed:    medication strength  how much to take  when to take this   buPROPion 300 MG 24 hr tablet Commonly known as: WELLBUTRIN XL Take 1 tablet (300 mg total) by mouth daily.   citalopram 20 MG tablet Commonly known as: CELEXA Take 1 tablet (20 mg total) by mouth daily.   clopidogrel 75 MG tablet Commonly known as: PLAVIX Take 1 tablet (75 mg total) by mouth daily.   fexofenadine 180 MG tablet Commonly known as: ALLEGRA Take 180 mg by mouth daily as needed.   fluticasone 50 MCG/ACT nasal spray Commonly known as: FLONASE Place 2 sprays into both nostrils daily.   meloxicam 15 MG tablet Commonly known as: MOBIC TAKE 1 TABLET (15 MG TOTAL) BY MOUTH DAILY AS NEEDED FOR PAIN WITH FOOD What changed: See the new instructions.   mesalamine 1.2 g EC tablet Commonly known as: LIALDA TAKE 2 TABLETS (2.4 G TOTAL) BY MOUTH 2 (TWO) TIMES DAILY.   pantoprazole 40 MG tablet Commonly known as: PROTONIX Take 1 tablet (40 mg total) by mouth daily.   zolpidem 12.5 MG CR tablet Commonly known as: AMBIEN CR TAKE 1 TABLET (12.5 MG TOTAL) BY MOUTH AT BEDTIME AS NEEDED FOR SLEEP What changed:   how much to take  reasons to take this  additional instructions      Follow-up Information    [facility] Follow up in 4 week(s).   Specialty: Neurology Why: stroke clinic. office will call with appt date and time. Contact information: [address] [place] [phone]       Tower, Wynelle Fanny, MD. Schedule an appointment as soon as possible for a visit in 1 week(s).   Specialties: Family Medicine, Radiology Contact information: [place] [address] [phone]        Nelva Bush, MD .   Specialty: Cardiology Contact information: [address] [place] [address] [phone]          Allergies  Allergen Reactions  . Sertraline Hcl     REACTION: diarrhea  . Zoloft  [Sertraline Hcl] Diarrhea    Consultations:  Neurology   Procedures/Studies: Ct Head Wo Contrast  Result Date: 05/23/2019 CLINICAL DATA:  Left facial weakness EXAM: CT HEAD WITHOUT CONTRAST TECHNIQUE: Contiguous axial images were obtained from the base of the skull through the vertex without intravenous contrast. COMPARISON:  None. FINDINGS: Brain: Ventricles are normal in size and configuration. There is a degree of invagination of CSF into the sella. There is no intracranial mass, hemorrhage, extra-axial fluid collection, or midline shift. Brain parenchyma appears unremarkable. No acute infarct. Vascular: No hyperdense vessels. No appreciable vascular calcification. Skull: Bony calvarium appears intact. Sinuses/Orbits: There is mild mucosal thickening involving several ethmoid air cells. Other paranasal sinuses are clear. Orbits appear symmetric bilaterally. Other: Mastoid air cells are clear. IMPRESSION: 1. Invagination of CSF into the sella, a finding of doubtful clinical significance. Ventricles normal in size and configuration. 2.  Brain parenchyma appears unremarkable.  No mass or hemorrhage. 3.  Mild mucosal thickening in several ethmoid air cells. Electronically Signed   By: Lowella Grip III M.D.   On: 05/23/2019 08:56  Mr Angio Head Wo Contrast  Result Date: 05/23/2019 CLINICAL DATA:  Cerebral aneurysm, subarachnoid hemorrhage, cerebral vasospasm, evaluate left-sided weakness, left facial droop. EXAM: MRI HEAD WITHOUT CONTRAST MRA HEAD WITHOUT CONTRAST TECHNIQUE: Multiplanar, multiecho pulse sequences of the brain and surrounding structures were obtained without intravenous contrast. Angiographic images of the head were obtained using MRA technique without contrast. COMPARISON:  Noncontrast head CT 05/23/2019 FINDINGS: MRI HEAD FINDINGS Brain: Multiple sequences are motion degraded. Acute infarct measuring 2.1 x 0.7 cm involving the right corona radiata/internal capsule and extending  inferiorly into the right basal ganglia. Associated T2/FLAIR hyperintensity at this site. Mild scattered T2/FLAIR hyperintensity within the cerebral white matter is nonspecific, but consistent chronic small vessel ischemic disease. No evidence of intracranial mass. No midline shift or extra-axial fluid collection. No chronic intracranial blood products. Cerebral volume is normal for age. Partially empty sella turcica. Vascular: Reported separately Skull and upper cervical spine: Normal marrow signal. Sinuses/Orbits: Open orbits visualized paranasal sinuses are clear. Visualized mastoid air cells are clear. MRA HEAD FINDINGS The examination is motion degraded. Bilateral ICA siphons patent without high-grade stenosis. Right middle and anterior cerebral arteries patent without high-grade proximal stenosis. Left middle and anterior cerebral arteries patent without high-grade proximal stenosis. Possible 2 mm aneurysm arising from the A2 right anterior cerebral artery on source images (series 3, image 61). However, evaluation is somewhat limited due to motion artifact. Visualized intracranial vertebral arteries are patent without high-grade stenosis. Basilar artery is patent without high-grade stenosis. Bilateral posterior cerebral arteries patent without high-grade proximal stenosis. A right posterior communicating artery is present. Left posterior communicating is poorly delineated and may be hypoplastic or absent. These results were called by telephone at the time of interpretation on 05/23/2019 at 3:09 pm to provider Dr. Tyrone Nine, Who verbally acknowledged these results. IMPRESSION: MRI brain: 1. Motion degraded examination. 2. 2.1 x 0.7 cm acute infarct involving right corona radiata/internal capsule and extending into the right basal ganglia. 3. Mild chronic small vessel ischemic disease. MRA head: 1. Motion degraded examination. 2. No evidence of intracranial large vessel occlusion or high-grade proximal arterial  stenosis. 3. A 2 mm aneurysm arising the A2 right anterior cerebral artery is questioned, although evaluation is somewhat limited due to motion degradation. Consider follow-up CT or MR angiography. Electronically Signed   By: Kellie Simmering   On: 05/23/2019 15:10   Mr Angio Neck W Wo Contrast  Result Date: 05/23/2019 CLINICAL DATA:  Initial evaluation for acute stroke. EXAM: MRA NECK WITHOUT AND WITH CONTRAST TECHNIQUE: Multiplanar and multiecho pulse sequences of the neck were obtained without and with intravenous contrast. Angiographic images of the neck were obtained using MRA technique without and with intravenous contrast. CONTRAST:  62m GADAVIST GADOBUTROL 1 MMOL/ML IV SOLN COMPARISON:  Prior MRI and brain MRA from earlier the same day. FINDINGS: Examination mildly limited by motion artifact. Source images reviewed. AORTIC ARCH: Visualized aortic arch of normal caliber with normal branch pattern. No hemodynamically significant stenosis seen about the origin of the great vessels. Visualized subclavian arteries widely patent. RIGHT CAROTID SYSTEM: Evaluation of the proximal right CCA mildly limited by motion. Visualized right CCA patent to the bifurcation without stenosis. No significant atheromatous narrowing seen about the right bifurcation. Right ICA mildly tortuous but widely patent distally to the circle-of-Willis without stenosis or occlusion. LEFT CAROTID SYSTEM: Left CCA patent to the bifurcation without stenosis. Evaluation of the left bifurcation mildly limited by motion, with no definite significant stenosis seen. Left ICA mildly tortuous but widely  patent distally to the circle-of-Willis without stenosis or occlusion. VERTEBRAL ARTERIES: Both vertebral arteries arise from the subclavian arteries. Vertebral arteries widely patent within the neck without stenosis or vascular occlusion. IMPRESSION: Negative MRA of the neck. No hemodynamically significant or critical flow limiting stenosis.  Electronically Signed   By: Jeannine Boga M.D.   On: 05/23/2019 20:11   Mr Brain Wo Contrast (neuro Protocol)  Result Date: 05/23/2019 CLINICAL DATA:  Cerebral aneurysm, subarachnoid hemorrhage, cerebral vasospasm, evaluate left-sided weakness, left facial droop. EXAM: MRI HEAD WITHOUT CONTRAST MRA HEAD WITHOUT CONTRAST TECHNIQUE: Multiplanar, multiecho pulse sequences of the brain and surrounding structures were obtained without intravenous contrast. Angiographic images of the head were obtained using MRA technique without contrast. COMPARISON:  Noncontrast head CT 05/23/2019 FINDINGS: MRI HEAD FINDINGS Brain: Multiple sequences are motion degraded. Acute infarct measuring 2.1 x 0.7 cm involving the right corona radiata/internal capsule and extending inferiorly into the right basal ganglia. Associated T2/FLAIR hyperintensity at this site. Mild scattered T2/FLAIR hyperintensity within the cerebral white matter is nonspecific, but consistent chronic small vessel ischemic disease. No evidence of intracranial mass. No midline shift or extra-axial fluid collection. No chronic intracranial blood products. Cerebral volume is normal for age. Partially empty sella turcica. Vascular: Reported separately Skull and upper cervical spine: Normal marrow signal. Sinuses/Orbits: Open orbits visualized paranasal sinuses are clear. Visualized mastoid air cells are clear. MRA HEAD FINDINGS The examination is motion degraded. Bilateral ICA siphons patent without high-grade stenosis. Right middle and anterior cerebral arteries patent without high-grade proximal stenosis. Left middle and anterior cerebral arteries patent without high-grade proximal stenosis. Possible 2 mm aneurysm arising from the A2 right anterior cerebral artery on source images (series 3, image 61). However, evaluation is somewhat limited due to motion artifact. Visualized intracranial vertebral arteries are patent without high-grade stenosis. Basilar  artery is patent without high-grade stenosis. Bilateral posterior cerebral arteries patent without high-grade proximal stenosis. A right posterior communicating artery is present. Left posterior communicating is poorly delineated and may be hypoplastic or absent. These results were called by telephone at the time of interpretation on 05/23/2019 at 3:09 pm to provider Dr. Tyrone Nine, Who verbally acknowledged these results. IMPRESSION: MRI brain: 1. Motion degraded examination. 2. 2.1 x 0.7 cm acute infarct involving right corona radiata/internal capsule and extending into the right basal ganglia. 3. Mild chronic small vessel ischemic disease. MRA head: 1. Motion degraded examination. 2. No evidence of intracranial large vessel occlusion or high-grade proximal arterial stenosis. 3. A 2 mm aneurysm arising the A2 right anterior cerebral artery is questioned, although evaluation is somewhat limited due to motion degradation. Consider follow-up CT or MR angiography. Electronically Signed   By: Kellie Simmering   On: 05/23/2019 15:10   Dg Chest Port 1 View  Result Date: 05/23/2019 CLINICAL DATA:  Hemiparesis, shortness of breath EXAM: PORTABLE CHEST 1 VIEW COMPARISON:  None. FINDINGS: The heart size and mediastinal contours are within normal limits. Both lungs are clear. The visualized skeletal structures are unremarkable. IMPRESSION: No active disease. Electronically Signed   By: Zetta Bills M.D.   On: 05/23/2019 16:49     Echo IMPRESSIONS    1. Left ventricular ejection fraction, by visual estimation, is 60 to 65%. The left ventricle has normal function. Normal left ventricular size. There is mildly increased left ventricular hypertrophy.  2. Left ventricular diastolic Doppler parameters are consistent with pseudonormalization pattern of LV diastolic filling.  3. The mitral valve is normal in structure. No evidence of mitral valve regurgitation.  No evidence of mitral stenosis.  4. The aortic valve is  tricuspid Aortic valve regurgitation was not visualized by color flow Doppler. Structurally normal aortic valve, with no evidence of sclerosis or stenosis.  5. Global right ventricle has normal systolic function.The right ventricular size is normal. No increase in right ventricular wall thickness.  6. Left atrial size was normal.  7. Right atrial size was normal.  8. The tricuspid valve is normal in structure. Tricuspid valve regurgitation was not visualized by color flow Doppler.  9. TR signal is inadequate for assessing pulmonary artery systolic pressure.  Subjective: Patient seen and examined at bedside.  No overnight fever, nausea or vomiting.  Still feels that her left-sided slightly weak and is hoping to go to CIR today.  Discharge Exam: Vitals:   05/25/19 1048 05/25/19 1214  BP: 132/77   Pulse: 91   Resp: 18 16  Temp: 97.7 F (36.5 C)   SpO2: 95% 95%    General exam: No distress. Respiratory system: Bilateral decreased breath sounds at bases, no wheezing Cardiovascular system: Rate controlled, S1-S2 heard Gastrointestinal system: Abdomen is morbidly obese nondistended, soft and nontender. Normal bowel sounds heard. Extremities: No cyanosis, edema    The results of significant diagnostics from this hospitalization (including imaging, microbiology, ancillary and laboratory) are listed below for reference.     Microbiology: Recent Results (from the past 240 hour(s))  SARS CORONAVIRUS 2 (TAT 6-24 HRS) Nasopharyngeal Nasopharyngeal Swab     Status: None   Collection Time: 05/23/19  3:30 PM   Specimen: Nasopharyngeal Swab  Result Value Ref Range Status   SARS Coronavirus 2 NEGATIVE NEGATIVE Final    Comment: (NOTE) SARS-CoV-2 target nucleic acids are NOT DETECTED. The SARS-CoV-2 RNA is generally detectable in upper and lower respiratory specimens during the acute phase of infection. Negative results do not preclude SARS-CoV-2 infection, do not rule out co-infections with  other pathogens, and should not be used as the sole basis for treatment or other patient management decisions. Negative results must be combined with clinical observations, patient history, and epidemiological information. The expected result is Negative. Fact Sheet for Patients: SugarRoll.be Fact Sheet for Healthcare Providers: https://www.woods-mathews.com/ This test is not yet approved or cleared by the [place] FDA and  has been authorized for detection and/or diagnosis of SARS-CoV-2 by FDA under an Emergency Use Authorization (EUA). This EUA will remain  in effect (meaning this test can be used) for the duration of the COVID-19 declaration under Section 56 4(b)(1) of the Act, 21 U.S.C. section 360bbb-3(b)(1), unless the authorization is terminated or revoked sooner. Performed at [facility], [place] [address] [place], [place] [address]      Labs: BNP (last 3 results) No results for input(s): BNP in the last 8760 hours. Basic Metabolic Panel: Recent Labs  Lab 05/23/19 0750 05/23/19 0811 05/24/19 0405  NA 138 140 139  K 4.0 4.1 3.6  CL 104 105 105  CO2 21*  --  21*  GLUCOSE 109* 105* 117*  BUN 8 8 7   CREATININE 0.90 0.70 0.83  CALCIUM 9.7  --  9.8   Liver Function Tests: Recent Labs  Lab 05/23/19 0750  AST 25  ALT 29  ALKPHOS 83  BILITOT 0.6  PROT 7.9  ALBUMIN 3.9   No results for input(s): LIPASE, AMYLASE in the last 168 hours. No results for input(s): AMMONIA in the last 168 hours. CBC: Recent Labs  Lab 05/23/19 0750 05/23/19 0811 05/24/19 0405  WBC 9.6  --  10.7*  NEUTROABS 6.0  --   --   HGB 12.9 14.6 12.5  HCT 42.2 43.0 40.1  MCV 82.1  --  81.2  PLT 469*  --  499*   Cardiac Enzymes: No results for input(s): CKTOTAL, CKMB, CKMBINDEX, TROPONINI in the last 168 hours. BNP: Invalid input(s): POCBNP CBG: Recent Labs  Lab 05/23/19 0752  GLUCAP 103*   D-Dimer No results for input(s): DDIMER  in the last 72 hours. Hgb A1c Recent Labs    05/24/19 0405  HGBA1C 6.2*   Lipid Profile Recent Labs    05/24/19 0405  CHOL 228*  HDL 32*  LDLCALC 166*  TRIG 151*  CHOLHDL 7.1   Thyroid function studies No results for input(s): TSH, T4TOTAL, T3FREE, THYROIDAB in the last 72 hours.  Invalid input(s): FREET3 Anemia work up No results for input(s): VITAMINB12, FOLATE, FERRITIN, TIBC, IRON, RETICCTPCT in the last 72 hours. Urinalysis    Component Value Date/Time   COLORURINE YELLOW 03/25/2007 1053   APPEARANCEUR CLEAR 03/25/2007 1053   LABSPEC 1.026 03/25/2007 1053   PHURINE 6.0 03/25/2007 1053   GLUCOSEU NEGATIVE 03/25/2007 1053   HGBUR NEGATIVE 03/25/2007 1053   BILIRUBINUR neg 12/21/2017 [place] 03/25/2007 1053   PROTEINUR neg 12/21/2017 1735   PROTEINUR NEGATIVE 03/25/2007 1053   UROBILINOGEN 0.2 12/21/2017 1735   UROBILINOGEN 0.2 03/25/2007 1053   NITRITE neg 12/21/2017 1735   NITRITE NEGATIVE 03/25/2007 1053   LEUKOCYTESUR Negative 12/21/2017 1735   Sepsis Labs Invalid input(s): PROCALCITONIN,  WBC,  LACTICIDVEN Microbiology Recent Results (from the past 240 hour(s))  SARS CORONAVIRUS 2 (TAT 6-24 HRS) Nasopharyngeal Nasopharyngeal Swab     Status: None   Collection Time: 05/23/19  3:30 PM   Specimen: Nasopharyngeal Swab  Result Value Ref Range Status   SARS Coronavirus 2 NEGATIVE NEGATIVE Final    Comment: (NOTE) SARS-CoV-2 target nucleic acids are NOT DETECTED. The SARS-CoV-2 RNA is generally detectable in upper and lower respiratory specimens during the acute phase of infection. Negative results do not preclude SARS-CoV-2 infection, do not rule out co-infections with other pathogens, and should not be used as the sole basis for treatment or other patient management decisions. Negative results must be combined with clinical observations, patient history, and epidemiological information. The expected result is Negative. Fact Sheet for  Patients: SugarRoll.be Fact Sheet for Healthcare Providers: https://www.woods-mathews.com/ This test is not yet approved or cleared by the [place] FDA and  has been authorized for detection and/or diagnosis of SARS-CoV-2 by FDA under an Emergency Use Authorization (EUA). This EUA will remain  in effect (meaning this test can be used) for the duration of the COVID-19 declaration under Section 56 4(b)(1) of the Act, 21 U.S.C. section 360bbb-3(b)(1), unless the authorization is terminated or revoked sooner. Performed at [facility], [place] [address] [place], [place] [address]      Time coordinating discharge: 35 minutes  SIGNED:   Aline August, MD  [facility] 05/25/2019, 12:53 PM

## 2019-05-25 NOTE — Plan of Care (Signed)
  Problem: Education: Goal: Knowledge of disease or condition will improve 05/25/2019 1826 by Myriam Forehand, RN Outcome: Adequate for Discharge 05/25/2019 1826 by Myriam Forehand, RN Outcome: Adequate for Discharge Goal: Knowledge of secondary prevention will improve 05/25/2019 1826 by Myriam Forehand, RN Outcome: Adequate for Discharge 05/25/2019 1826 by Myriam Forehand, RN Outcome: Adequate for Discharge Goal: Knowledge of patient specific risk factors addressed and post discharge goals established will improve 05/25/2019 1826 by Myriam Forehand, RN Outcome: Adequate for Discharge 05/25/2019 1826 by Myriam Forehand, RN Outcome: Adequate for Discharge   Problem: Coping: Goal: Will verbalize positive feelings about self 05/25/2019 1826 by Myriam Forehand, RN Outcome: Adequate for Discharge 05/25/2019 1826 by Myriam Forehand, RN Outcome: Adequate for Discharge Goal: Will identify appropriate support needs 05/25/2019 1826 by Myriam Forehand, RN Outcome: Adequate for Discharge 05/25/2019 1826 by Myriam Forehand, RN Outcome: Adequate for Discharge   Problem: Health Behavior/Discharge Planning: Goal: Ability to manage health-related needs will improve 05/25/2019 1826 by Myriam Forehand, RN Outcome: Adequate for Discharge 05/25/2019 1826 by Myriam Forehand, RN Outcome: Adequate for Discharge   Problem: Self-Care: Goal: Ability to participate in self-care as condition permits will improve 05/25/2019 1826 by Myriam Forehand, RN Outcome: Adequate for Discharge 05/25/2019 1826 by Myriam Forehand, RN Outcome: Adequate for Discharge Goal: Verbalization of feelings and concerns over difficulty with self-care will improve 05/25/2019 1826 by Myriam Forehand, RN Outcome: Adequate for Discharge 05/25/2019 1826 by Myriam Forehand, RN Outcome: Adequate for Discharge Goal: Ability to communicate needs accurately will improve 05/25/2019 1826 by Myriam Forehand, RN Outcome: Adequate for Discharge 05/25/2019 1826 by Myriam Forehand,  RN Outcome: Adequate for Discharge   Problem: Nutrition: Goal: Risk of aspiration will decrease 05/25/2019 1826 by Myriam Forehand, RN Outcome: Adequate for Discharge 05/25/2019 1826 by Myriam Forehand, RN Outcome: Adequate for Discharge Goal: Dietary intake will improve 05/25/2019 1826 by Myriam Forehand, RN Outcome: Adequate for Discharge 05/25/2019 1826 by Myriam Forehand, RN Outcome: Adequate for Discharge   Problem: Intracerebral Hemorrhage Tissue Perfusion: Goal: Complications of Intracerebral Hemorrhage will be minimized 05/25/2019 1826 by Myriam Forehand, RN Outcome: Adequate for Discharge 05/25/2019 1826 by Myriam Forehand, RN Outcome: Adequate for Discharge   Problem: Ischemic Stroke/TIA Tissue Perfusion: Goal: Complications of ischemic stroke/TIA will be minimized 05/25/2019 1826 by Myriam Forehand, RN Outcome: Adequate for Discharge 05/25/2019 1826 by Myriam Forehand, RN Outcome: Adequate for Discharge   Problem: Spontaneous Subarachnoid Hemorrhage Tissue Perfusion: Goal: Complications of Spontaneous Subarachnoid Hemorrhage will be minimized 05/25/2019 1826 by Myriam Forehand, RN Outcome: Adequate for Discharge 05/25/2019 1826 by Myriam Forehand, RN Outcome: Adequate for Discharge

## 2019-05-25 NOTE — Progress Notes (Signed)
Inpatient Rehab Admissions:  Inpatient Rehab Consult received.  I met with patient and her sister at the bedside for rehabilitation assessment and to discuss goals and expectations of an inpatient rehab admission.  They are both hopeful for CIR admission to potentially reach mod I/indep level with short LOS.  Will have 24/7 support at discharge if needed. I will open a case with insurance for potential admission later today pending approval.   Signed: Shann Medal, PT, DPT Admissions Coordinator [phone] 05/25/19  11:07 AM

## 2019-05-25 NOTE — PMR Pre-admission (Signed)
PMR Admission Coordinator Pre-Admission Assessment  Patient: Carol Johnson is an 53 y.o., female MRN: [id number] DOB: [birth year] Height: 5' 3"  (160 cm) Weight: 105.2 kg  Insurance Information HMO:     PPO: yes     PCP:      IPA:      80/20:      [facility]  [facility]      Policy#: [id number]      Subscriber: patient CM Name: Dominick      Phone#: [phone]     Fax#: no fax Pre-Cert#: Case [id number]; Josem Kaufmann provided by [facility] with updates due to him on 9/29 if pt still in house.        Employer:  Benefits:  Phone #: [phone]     Name:  Eff. Date: 04/25/2017     Deduct: $700 ($180.60 met)      Out of Pocket Max: $4000 ($577.71 met)      Life Max: n/a CIR: 80%      SNF: 80% Outpatient: 80%     Co-Pay: 20% Home Health: 80%      Co-Pay: 20% DME: 80%     Co-Pay: 20% Providers: preferred network  SECONDARY:       Policy#:       Subscriber:  CM Name:       Phone#:      Fax#:  Pre-Cert#:       Employer:  Benefits:  Phone #:      Name:  Eff. Date:      Deduct:       Out of Pocket Max:       Life Max:  CIR:       SNF:  Outpatient:      Co-Pay:  Home Health:       Co-Pay:  DME:      Co-Pay:   Medicaid Application Date:       Case Manager:  Disability Application Date:       Case Worker:   The "Data Collection Information Summary" for patients in Inpatient Rehabilitation Facilities with attached "Privacy Act [place] Records" was provided and verbally reviewed with: N/A  Emergency Contact Information Contact Information    Name Relation Home Work [place] F Spouse [phone]        Current Medical History  Patient Admitting Diagnosis: CVA  History of Present Illness: Carol Johnson is a 53 year old right-handed female history of hypertension, ulcerative colitis, B12 deficiency, DVT 2016 left posterior tibial vein contributed to estrogen use and smoking. She was on Xarelto and has been since completed, hyperlipidemia, asthma, and quit  smoking 17 months ago.  Presented 05/23/2019 with left-sided weakness.  Cranial CT scan negative.  MRI showed a 2 cm acute infarct right corona radiata internal capsule extending into the right basal ganglia.  MRA head with no LVO but concerning for possible 2 mm aneurysm from right A2 division of the anterior cerebral artery.  MRA of neck unremarkable for any acute occlusion.  Echocardiogram with ejection fraction of 65%.  Neurology follow-up placed on aspirin and Plavix x3 weeks then aspirin alone.  Subcutaneous Lovenox for DVT prophylaxis.  Recommendations for 30-day cardiac event monitor as outpatient.  Tolerating a regular consistency diet.  Therapy evaluations completed and patient was recommended for a comprehensive rehab program Complete NIHSS TOTAL: 5  Patient's medical record from [facility] has been reviewed by the rehabilitation admission coordinator and physician.  Past Medical History  Past Medical History:  Diagnosis Date  . Abdominal pain, unspecified site   . Allergic rhinitis   . Anxiety   . Asthma   . Depression   . DVT (deep venous thrombosis) ([place]) 2016  . Fibroid uterus   . GERD (gastroesophageal reflux disease)   . Hypercholesterolemia   . Hypertension   . Internal hemorrhoid   . Irritable bowel syndrome   . Lymphocytic colitis 01/02/2015  . Pneumonia 1998  . Ulcerative colitis ([place])   . URI (upper respiratory infection)   . Uterine fibroid   . Vitamin B12 deficiency     Family History   family history includes Colon cancer in her paternal aunt; Heart disease in her mother, paternal aunt, and paternal uncle; Heart failure in her maternal grandmother; Hyperlipidemia in her mother; Kidney disease in her cousin; Mitral valve prolapse in her mother; Pulmonary fibrosis in her father; Uterine cancer in her paternal grandmother.  Prior Rehab/Hospitalizations Has the patient had prior rehab or hospitalizations prior to admission? No  Has the patient had major  surgery during 100 days prior to admission? No   Current Medications  Current Facility-Administered Medications:  .  acetaminophen (TYLENOL) tablet 650 mg, 650 mg, Oral, Q4H PRN, 650 mg at 05/25/19 0823 **OR** acetaminophen (TYLENOL) solution 650 mg, 650 mg, Per Tube, Q4H PRN **OR** acetaminophen (TYLENOL) suppository 650 mg, 650 mg, Rectal, Q4H PRN, Smith, Rondell A, MD .  albuterol (PROVENTIL) (2.5 MG/3ML) 0.083% nebulizer solution 2.5 mg, 2.5 mg, Nebulization, Q6H PRN, Tamala Julian, Rondell A, MD, 2.5 mg at 05/25/19 1214 .  aspirin EC tablet 81 mg, 81 mg, Oral, Daily, Rosalin Hawking, MD, 81 mg at 05/25/19 1127 .  atorvastatin (LIPITOR) tablet 80 mg, 80 mg, Oral, q1800, Fuller Plan A, MD, 80 mg at 05/24/19 1729 .  buPROPion (WELLBUTRIN XL) 24 hr tablet 300 mg, 300 mg, Oral, Daily, Smith, Rondell A, MD, 300 mg at 05/25/19 1126 .  citalopram (CELEXA) tablet 20 mg, 20 mg, Oral, Daily, Smith, Rondell A, MD, 20 mg at 05/25/19 1127 .  clopidogrel (PLAVIX) tablet 75 mg, 75 mg, Oral, Daily, Rosalin Hawking, MD, 75 mg at 05/25/19 1127 .  enoxaparin (LOVENOX) injection 40 mg, 40 mg, Subcutaneous, Q24H, Smith, Rondell A, MD, 40 mg at 05/24/19 1727 .  labetalol (NORMODYNE) injection 5 mg, 5 mg, Intravenous, Q2H PRN, Smith, Rondell A, MD .  loratadine (CLARITIN) tablet 10 mg, 10 mg, Oral, Daily, Alekh, Kshitiz, MD, 10 mg at 05/25/19 1128 .  mesalamine (LIALDA) EC tablet 2.4 g, 2.4 g, Oral, BID, Smith, Rondell A, MD, 2.4 g at 05/25/19 1126 .  pantoprazole (PROTONIX) EC tablet 40 mg, 40 mg, Oral, Daily, Tamala Julian, Rondell A, MD, 40 mg at 05/25/19 1127 .  sodium chloride flush (NS) 0.9 % injection 3 mL, 3 mL, Intravenous, Once, Delo, Douglas, MD .  zolpidem (AMBIEN) tablet 5 mg, 5 mg, Oral, QHS PRN,MR X 1, Smith, Rondell A, MD, 5 mg at 05/24/19 0215  Patients Current Diet:  Diet Order            Diet - low sodium heart healthy        Diet Heart Room service appropriate? Yes; Fluid consistency: Thin  Diet effective now               Precautions / Restrictions Precautions Precautions: Fall, Other (comment) Precaution Comments: L weakness Restrictions Weight Bearing Restrictions: No   Has the patient had 2 or more falls or a fall with injury in the past year? Yes  Prior  Activity Level Community (5-7x/wk): worked from home, still driving, raising children  Prior Functional Level Self Care: Did the patient need help bathing, dressing, using the toilet or eating? Independent  Indoor Mobility: Did the patient need assistance with walking from room to room (with or without device)? Independent  Stairs: Did the patient need assistance with internal or external stairs (with or without device)? Independent  Functional Cognition: Did the patient need help planning regular tasks such as shopping or remembering to take medications? Independent  Home Assistive Devices / Equipment Home Assistive Devices/Equipment: None Home Equipment: None  Prior Device Use: Indicate devices/aids used by the patient prior to current illness, exacerbation or injury? None of the above  Current Functional Level Cognition  Arousal/Alertness: Awake/alert Overall Cognitive Status: Within Functional Limits for tasks assessed Orientation Level: Oriented X4 Attention: Focused, Sustained Focused Attention: Appears intact(Vigilance WNL: 1/1) Sustained Attention: Appears intact(Serial 7s: 3/3) Memory: Appears intact(Immediate: 5/5; delayed: 5/5) Awareness: Appears intact Executive Function: Reasoning, Sequencing, Organizing Reasoning: Appears intact(Abstraction: 2/2) Sequencing: Impaired Sequencing Impairment: Verbal complex(Clock drawing: 2/3) Organizing: Appears intact(Backward digit span: 1/1)    Extremity Assessment (includes Sensation/Coordination)  Upper Extremity Assessment: Generalized weakness, LUE deficits/detail LUE Deficits / Details: 3-/5 MM grade and poor coordination; poor grasp  LUE Coordination: decreased  fine motor, decreased gross motor  Lower Extremity Assessment: Defer to PT evaluation RLE Deficits / Details: WNL RLE Sensation: WNL RLE Coordination: WNL LLE Deficits / Details: quad 4+/5, dorsiflexors 4+/5, hip flexor 4/5 LLE Sensation: decreased proprioception LLE Coordination: decreased gross motor, decreased fine motor    ADLs  Overall ADL's : Needs assistance/impaired Eating/Feeding: Set up, Sitting Eating/Feeding Details (indicate cue type and reason): with RUE mostly Grooming: Min guard, Standing, Cueing for safety, Cueing for sequencing Upper Body Bathing: Moderate assistance, Standing Lower Body Bathing: Maximal assistance Upper Body Dressing : Moderate assistance, Standing Lower Body Dressing: Maximal assistance, Sitting/lateral leans, Sit to/from stand Toilet Transfer: Minimal assistance, Ambulation, RW Toileting- Clothing Manipulation and Hygiene: Maximal assistance, Cueing for safety, Cueing for sequencing, Sitting/lateral lean, Sit to/from stand Functional mobility during ADLs: Minimal assistance, Rolling walker, Cueing for safety, Cueing for sequencing General ADL Comments: Pt limited by LUE weakness, poor mobility and decreased ability to care for self.    Mobility  Overal bed mobility: Needs Assistance Bed Mobility: Supine to Sit Supine to sit: Min assist General bed mobility comments: MinA to bring legs around and trunk up to upright    Transfers  Overall transfer level: Needs assistance Equipment used: Rolling walker (2 wheeled) Transfers: Sit to/from Stand Sit to Stand: Min assist, Min guard, +2 safety/equipment General transfer comment: MinA to minguardA for LLE taking larger steps due to lack of coordination    Ambulation / Gait / Stairs / Wheelchair Mobility  Ambulation/Gait Ambulation/Gait assistance: Min guard, +2 safety/equipment Gait Distance (Feet): 25 Feet(25f + 159fwith standing rest break in between) Assistive device: Rolling walker (2  wheeled) Gait Pattern/deviations: Step-to pattern, Decreased step length - right, Decreased stance time - left, Decreased stride length, Decreased dorsiflexion - left, Decreased weight shift to left, Trunk flexed General Gait Details: definite loss of proprioception L LE and extended time to walk gait distances, easily fatigued, min guard x2 for safety but could ambulate with min guard of +1 Gait velocity: decreased    Posture / Balance Balance Overall balance assessment: Needs assistance Sitting-balance support: Feet supported, Bilateral upper extremity supported Sitting balance-Leahy Scale: Good Standing balance support: Bilateral upper extremity supported, During functional activity Standing balance-Leahy  Scale: Fair Standing balance comment: reliant on external support; support for R hand to stay on RW    Special needs/care consideration BiPAP/CPAP no CPM no Continuous Drip IV no Dialysis no        Days n/a Life Vest no Oxygen no Special Bed no Trach Size no Wound Vac (area) no      Location no Skin intact                              Locati Bowel mgmt: continent, last bbm 9/22 Bladder mgmt: continent Diabetic mgmt: no Behavioral consideration no Chemo/radiation no   Previous Home Environment (from acute therapy documentation) Living Arrangements: Other relatives  Lives With: Family Available Help at Discharge: Family, Available 24 hours/day Type of Home: House Home Layout: Two level, Bed/bath upstairs Alternate Level Stairs-Rails: Can reach both Alternate Level Stairs-Number of Steps: flight B railings Home Access: Stairs to enter Entrance Stairs-Rails: Can reach both Entrance Stairs-Number of Steps: 6 steps to enter, bedroom on second floor, has bilateral railings on each side Bathroom Shower/Tub: Government social research officer Accessibility: Yes Home Care Services: No Additional Comments: very independent with bathing/dressing/grooming, was  driving  Discharge Living Setting Plans for Discharge Living Setting: Patient's home Type of Home at Discharge: House Discharge Home Layout: Two level, Bed/bath upstairs Alternate Level Stairs-Rails: Can reach both Alternate Level Stairs-Number of Steps: full flight Discharge Home Access: Stairs to enter Entrance Stairs-Rails: Can reach both Entrance Stairs-Number of Steps: 6 Discharge Bathroom Shower/Tub: Tub/shower unit Discharge Bathroom Toilet: Standard Discharge Bathroom Accessibility: Yes Does the patient have any problems obtaining your medications?: No  Social/Family/Support Systems Patient Roles: Parent Anticipated Caregiver: mod I goals, pt's sister Coralyn Mark) in the room during assessment and states family can support pt if she does not reach mod I Anticipated Caregiver's Contact Information: Jerrye Noble [phone] Ability/Limitations of Caregiver: works Building control surveyor Availability: 24/7 Discharge Plan Discussed with Primary Caregiver: Yes Is Caregiver In Agreement with Plan?: Yes Does Caregiver/Family have Issues with Lodging/Transportation while Pt is in Rehab?: No  Goals/Additional Needs Patient/Family Goal for Rehab: PT/OT mod I Expected length of stay: 3-5 days Dietary Needs: heart healthy/thin Pt/Family Agrees to Admission and willing to participate: Yes Program Orientation Provided & Reviewed with Pt/Caregiver Including Roles  & Responsibilities: Yes  Decrease burden of Care through IP rehab admission: n/a  Possible need for SNF placement upon discharge: no  Patient Condition: I have reviewed medical records from [facility], spoken with CSW, and patient and family member. I met with patient at the bedside for inpatient rehabilitation assessment.  Patient will benefit from ongoing PT and OT, can actively participate in 3 hours of therapy a day 5 days of the week, and can make measurable gains during the admission.  Patient will also benefit from the  coordinated team approach during an Inpatient Acute Rehabilitation admission.  The patient will receive intensive therapy as well as Rehabilitation physician, nursing, social worker, and care management interventions.  Due to safety, medication administration, pain management and patient education the patient requires 24 hour a day rehabilitation nursing.  The patient is currently min assist with mobility and basic ADLs.  Discharge setting and therapy post discharge at home with home health is anticipated.  Patient has agreed to participate in the Acute Inpatient Rehabilitation Program and will admit today.  Preadmission Screen Completed By:  Michel Santee, PT, DPT 05/25/2019 12:47 PM ______________________________________________________________________  Discussed status with Dr. Naaman Plummer on 05/25/19  at 12:58 PM  and received approval for admission today.  Admission Coordinator:  Michel Santee, PT, DPT  time 12:59 PM Sudie Grumbling 05/25/19    Assessment/Plan: Diagnosis: right CR/IC infarct 1. Does the need for close, 24 hr/day Medical supervision in concert with the patient's rehab needs make it unreasonable for this patient to be served in a less intensive setting? Yes 2. Co-Morbidities requiring supervision/potential complications: HTN, ulcerative colitis 3. Due to bladder management, bowel management, safety, skin/wound care, disease management, medication administration, pain management and patient education, does the patient require 24 hr/day rehab nursing? Yes 4. Does the patient require coordinated care of a physician, rehab nurse, PT (1-2 hrs/day, 5 days/week) and OT (1-2 hrs/day, 5 days/week) to address physical and functional deficits in the context of the above medical diagnosis(es)? Yes Addressing deficits in the following areas: balance, endurance, locomotion, strength, transferring, bowel/bladder control, bathing, dressing, feeding, grooming, toileting and psychosocial support 5. Can the  patient actively participate in an intensive therapy program of at least 3 hrs of therapy 5 days a week? Yes 6. The potential for patient to make measurable gains while on inpatient rehab is excellent 7. Anticipated functional outcomes upon discharge from inpatients are: modified independent PT, modified independent OT, n/a SLP 8. Estimated rehab length of stay to reach the above functional goals is: 3-5 days 9. Anticipated D/C setting: Home 10. Anticipated post D/C treatments: [place] therapy 11. Overall Rehab/Functional Prognosis: excellent  MD Signature: Meredith Staggers, MD, [place] [facility] 05/25/2019

## 2019-05-25 NOTE — Plan of Care (Signed)
  Problem: Education: Goal: Knowledge of disease or condition will improve Outcome: Adequate for Discharge Goal: Knowledge of secondary prevention will improve Outcome: Adequate for Discharge Goal: Knowledge of patient specific risk factors addressed and post discharge goals established will improve Outcome: Adequate for Discharge   Problem: Coping: Goal: Will verbalize positive feelings about self Outcome: Adequate for Discharge Goal: Will identify appropriate support needs Outcome: Adequate for Discharge   Problem: Health Behavior/Discharge Planning: Goal: Ability to manage health-related needs will improve Outcome: Adequate for Discharge   Problem: Self-Care: Goal: Ability to participate in self-care as condition permits will improve Outcome: Adequate for Discharge Goal: Verbalization of feelings and concerns over difficulty with self-care will improve Outcome: Adequate for Discharge Goal: Ability to communicate needs accurately will improve Outcome: Adequate for Discharge   Problem: Nutrition: Goal: Risk of aspiration will decrease Outcome: Adequate for Discharge Goal: Dietary intake will improve Outcome: Adequate for Discharge   Problem: Intracerebral Hemorrhage Tissue Perfusion: Goal: Complications of Intracerebral Hemorrhage will be minimized Outcome: Adequate for Discharge   Problem: Ischemic Stroke/TIA Tissue Perfusion: Goal: Complications of ischemic stroke/TIA will be minimized Outcome: Adequate for Discharge   Problem: Spontaneous Subarachnoid Hemorrhage Tissue Perfusion: Goal: Complications of Spontaneous Subarachnoid Hemorrhage will be minimized Outcome: Adequate for Discharge

## 2019-05-25 NOTE — Progress Notes (Signed)
Inpatient Rehab Admissions Coordinator:    I have insurance authorization and a bed available for pt to admit to CIR today. I will let pt/family, RN, and CM know.   Shann Medal, PT, DPT Admissions Coordinator [phone] 05/25/19  12:02 PM

## 2019-05-25 NOTE — TOC Transition Note (Signed)
Transition of Care [facility]) - CM/SW Discharge Note   Patient Details  Name: Carol Johnson MRN: [id number] Date of Birth: [birth year]  [facility] CM/SW Contact:  Pollie Friar, RN Phone Number: 05/25/2019, 12:15 PM   Clinical Narrative:    Pt is discharging to CIR today. CM is signing off.    Final next level of care: IP Rehab Facility Barriers to Discharge: No Barriers Identified   Patient Goals and CMS Choice        Discharge Placement                       Discharge Plan and Services                                     Social Determinants of Health (SDOH) Interventions     Readmission Risk Interventions No flowsheet data found.

## 2019-05-25 NOTE — Plan of Care (Signed)
  Problem: Education: Goal: Knowledge of disease or condition will improve 05/25/2019 1827 by Myriam Forehand, RN Outcome: Adequate for Discharge 05/25/2019 1826 by Myriam Forehand, RN Outcome: Adequate for Discharge 05/25/2019 1826 by Myriam Forehand, RN Outcome: Adequate for Discharge Goal: Knowledge of secondary prevention will improve 05/25/2019 1827 by Myriam Forehand, RN Outcome: Adequate for Discharge 05/25/2019 1826 by Myriam Forehand, RN Outcome: Adequate for Discharge 05/25/2019 1826 by Myriam Forehand, RN Outcome: Adequate for Discharge Goal: Knowledge of patient specific risk factors addressed and post discharge goals established will improve 05/25/2019 1827 by Myriam Forehand, RN Outcome: Adequate for Discharge 05/25/2019 1826 by Myriam Forehand, RN Outcome: Adequate for Discharge 05/25/2019 1826 by Myriam Forehand, RN Outcome: Adequate for Discharge   Problem: Coping: Goal: Will verbalize positive feelings about self 05/25/2019 1827 by Myriam Forehand, RN Outcome: Adequate for Discharge 05/25/2019 1826 by Myriam Forehand, RN Outcome: Adequate for Discharge 05/25/2019 1826 by Myriam Forehand, RN Outcome: Adequate for Discharge Goal: Will identify appropriate support needs 05/25/2019 1827 by Myriam Forehand, RN Outcome: Adequate for Discharge 05/25/2019 1826 by Myriam Forehand, RN Outcome: Adequate for Discharge 05/25/2019 1826 by Myriam Forehand, RN Outcome: Adequate for Discharge   Problem: Health Behavior/Discharge Planning: Goal: Ability to manage health-related needs will improve 05/25/2019 1827 by Myriam Forehand, RN Outcome: Adequate for Discharge 05/25/2019 1826 by Myriam Forehand, RN Outcome: Adequate for Discharge 05/25/2019 1826 by Myriam Forehand, RN Outcome: Adequate for Discharge   Problem: Self-Care: Goal: Ability to participate in self-care as condition permits will improve 05/25/2019 1827 by Myriam Forehand, RN Outcome: Adequate for Discharge 05/25/2019 1826 by Myriam Forehand, RN Outcome:  Adequate for Discharge 05/25/2019 1826 by Myriam Forehand, RN Outcome: Adequate for Discharge Goal: Verbalization of feelings and concerns over difficulty with self-care will improve 05/25/2019 1827 by Myriam Forehand, RN Outcome: Adequate for Discharge 05/25/2019 1826 by Myriam Forehand, RN Outcome: Adequate for Discharge 05/25/2019 1826 by Myriam Forehand, RN Outcome: Adequate for Discharge Goal: Ability to communicate needs accurately will improve 05/25/2019 1827 by Myriam Forehand, RN Outcome: Adequate for Discharge 05/25/2019 1826 by Myriam Forehand, RN Outcome: Adequate for Discharge 05/25/2019 1826 by Myriam Forehand, RN Outcome: Adequate for Discharge   Problem: Nutrition: Goal: Risk of aspiration will decrease 05/25/2019 1827 by Myriam Forehand, RN Outcome: Adequate for Discharge 05/25/2019 1826 by Myriam Forehand, RN Outcome: Adequate for Discharge 05/25/2019 1826 by Myriam Forehand, RN Outcome: Adequate for Discharge Goal: Dietary intake will improve 05/25/2019 1827 by Myriam Forehand, RN Outcome: Adequate for Discharge 05/25/2019 1826 by Myriam Forehand, RN Outcome: Adequate for Discharge 05/25/2019 1826 by Myriam Forehand, RN Outcome: Adequate for Discharge   Problem: Intracerebral Hemorrhage Tissue Perfusion: Goal: Complications of Intracerebral Hemorrhage will be minimized 05/25/2019 1827 by Myriam Forehand, RN Outcome: Adequate for Discharge 05/25/2019 1826 by Myriam Forehand, RN Outcome: Adequate for Discharge 05/25/2019 1826 by Myriam Forehand, RN Outcome: Adequate for Discharge   Problem: Ischemic Stroke/TIA Tissue Perfusion: Goal: Complications of ischemic stroke/TIA will be minimized 05/25/2019 1827 by Myriam Forehand, RN Outcome: Adequate for Discharge 05/25/2019 1826 by Myriam Forehand, RN Outcome: Adequate for Discharge 05/25/2019 1826 by Myriam Forehand, RN Outcome: Adequate for Discharge   Problem: Spontaneous Subarachnoid Hemorrhage Tissue Perfusion: Goal: Complications of Spontaneous  Subarachnoid Hemorrhage will be minimized 05/25/2019 1827 by Myriam Forehand, RN Outcome: Adequate for Discharge 05/25/2019 1826 by Myriam Forehand, RN Outcome: Adequate for Discharge 05/25/2019 1826 by Myriam Forehand, RN Outcome: Adequate for Discharge

## 2019-05-25 NOTE — H&P (Signed)
Physical Medicine and Rehabilitation Admission H&P        Chief Complaint  Patient presents with   Weakness  : HPI: Carol Johnson is a 53 year old right-handed female history of hypertension, ulcerative colitis, B12 deficiency, DVT 2016 left posterior tibial vein contributed to estrogen use and smoking she was on Xarelto and has been since completed, hyperlipidemia, asthma, and quit smoking 17 months ago..  Per chart review patient independent prior to admission to level home with bed and bath upstairs 6 steps to entry.  She lives with family in good support.  Presented 05/23/2019 with left-sided weakness.  Cranial CT scan negative.  MRI showed a 2 cm acute infarct right corona radiata internal capsule extending into the right basal ganglia.  MRA head with no LVO but concerning for possible 2 mm aneurysm from right A2 division of the anterior cerebral artery.  MRA of neck unremarkable for any acute occlusion.  Echocardiogram with ejection fraction of 65%.  Neurology follow-up placed on aspirin and Plavix x3 weeks then aspirin alone.  Subcutaneous Lovenox for DVT prophylaxis.  Recommendations for 30-day cardiac event monitor as outpatient.  Tolerating a regular consistency diet.  Therapy evaluations completed and patient was admitted for a comprehensive rehab program   Review of Systems  Constitutional: Negative for chills and fever.  HENT: Negative for hearing loss.   Eyes: Negative for blurred vision and double vision.  Respiratory: Negative for cough and shortness of breath.   Cardiovascular: Negative for chest pain, palpitations and leg swelling.  Gastrointestinal: Positive for diarrhea. Negative for heartburn, nausea and vomiting.       GERD  Genitourinary: Negative for dysuria, flank pain and hematuria.  Musculoskeletal: Positive for myalgias. Negative for back pain.  Skin: Negative for rash.  Neurological: Positive for weakness and headaches.  Psychiatric/Behavioral: The  patient has insomnia.        Anxiety  All other systems reviewed and are negative.       Past Medical History:  Diagnosis Date   Abdominal pain, unspecified site     Allergic rhinitis     Anxiety     Asthma     Depression     DVT (deep venous thrombosis) ([place]) 2016   Fibroid uterus     GERD (gastroesophageal reflux disease)     Hypercholesterolemia     Hypertension     Internal hemorrhoid     Irritable bowel syndrome     Lymphocytic colitis 01/02/2015   Pneumonia 1998   Ulcerative colitis ([place])     URI (upper respiratory infection)     Uterine fibroid     Vitamin B12 deficiency          Past Surgical History:  Procedure Laterality Date   CESAREAN SECTION   2004   CHOLECYSTECTOMY        laparoscopic "gallstones"   ESOPHAGOGASTRODUODENOSCOPY (EGD) WITH PROPOFOL N/A 05/03/2014    Procedure: ESOPHAGOGASTRODUODENOSCOPY (EGD) WITH PROPOFOL;  Surgeon: Inda Castle, MD;  Location: [facility];  Service: Endoscopy;  Laterality: N/A;   MYOMECTOMY       UTERINE FIBROID SURGERY            Family History  Problem Relation Age of Onset   Pulmonary fibrosis Father     Colon cancer Paternal Aunt     Uterine cancer Paternal Grandmother          with mets to the colon    Heart failure Maternal Grandmother  Heart disease Paternal Uncle     Heart disease Paternal Aunt     Kidney disease Cousin     Hyperlipidemia Mother     Mitral valve prolapse Mother     Heart disease Mother          arrhythmia   Social History:  reports that she quit smoking about 17 months ago. Her smoking use included cigarettes. She has a 1.00 pack-year smoking history. She has never used smokeless tobacco. She reports current alcohol use of about 5.0 standard drinks of alcohol per week. She reports that she does not use drugs. Allergies:       Allergies  Allergen Reactions   Sertraline Hcl        REACTION: diarrhea   Zoloft [Sertraline Hcl] Diarrhea           Medications Prior to Admission  Medication Sig Dispense Refill   buPROPion (WELLBUTRIN XL) 300 MG 24 hr tablet Take 1 tablet (300 mg total) by mouth daily. 90 tablet 3   citalopram (CELEXA) 20 MG tablet Take 1 tablet (20 mg total) by mouth daily. 90 tablet 3   fexofenadine (ALLEGRA) 180 MG tablet Take 180 mg by mouth daily as needed.        hydrochlorothiazide (MICROZIDE) 12.5 MG capsule Take 1 capsule (12.5 mg total) by mouth daily. 90 capsule 3   losartan (COZAAR) 25 MG tablet Take 1 tablet (25 mg total) by mouth daily. Please make overdue appt with Dr. Saunders Revel before anymore refills. 2nd attempt (Patient taking differently: Take 25 mg by mouth daily. ) 15 tablet 0   meloxicam (MOBIC) 15 MG tablet TAKE 1 TABLET (15 MG TOTAL) BY MOUTH DAILY AS NEEDED FOR PAIN WITH FOOD (Patient taking differently: Take 15 mg by mouth as needed for pain. ) 30 tablet 3   mesalamine (LIALDA) 1.2 g EC tablet TAKE 2 TABLETS (2.4 G TOTAL) BY MOUTH 2 (TWO) TIMES DAILY. 60 tablet 2   pantoprazole (PROTONIX) 40 MG tablet Take 1 tablet (40 mg total) by mouth daily. 90 tablet 3   zolpidem (AMBIEN CR) 12.5 MG CR tablet TAKE 1 TABLET (12.5 MG TOTAL) BY MOUTH AT BEDTIME AS NEEDED FOR SLEEP (Patient taking differently: Take 2.25-12.5 mg by mouth at bedtime as needed for sleep. ) 30 tablet 3   albuterol (PROVENTIL HFA;VENTOLIN HFA) 108 (90 Base) MCG/ACT inhaler Inhale 2 puffs into the lungs every 4 (four) hours as needed for wheezing or shortness of breath (cough, shortness of breath or wheezing.). (Patient not taking: Reported on 05/23/2019) 1 Inhaler 1   atorvastatin (LIPITOR) 10 MG tablet Take 1 tablet (10 mg total) daily by mouth. (Patient not taking: Reported on 05/23/2019) 30 tablet 11   fluticasone (FLONASE) 50 MCG/ACT nasal spray Place 2 sprays into both nostrils daily. (Patient not taking: Reported on 05/23/2019) 16 g 6   metoprolol succinate (TOPROL-XL) 25 MG 24 hr tablet Take 1 tablet (25 mg total) by mouth daily.  Take with or immediately following a meal. (Patient not taking: Reported on 05/23/2019) 90 tablet 3   predniSONE (DELTASONE) 20 MG tablet Take 1 tablet (20 mg total) by mouth daily with breakfast. (Patient not taking: Reported on 05/23/2019) 10 tablet 0     Drug Regimen Review Drug regimen was reviewed and remains appropriate with no significant issues identified   Home: Home Living Family/patient expects to be discharged to:: Private residence Living Arrangements: Other relatives Available Help at Discharge: Family, Available 24 hours/day Type of Home: [place]  Access: Stairs to enter [employer] of Steps: 6 steps to enter, bedroom on second floor, has bilateral railings on each side Entrance Stairs-Rails: Can reach both Home Layout: Two level, Bed/bath upstairs Alternate Level Stairs-Number of Steps: flight B railings Alternate Level Stairs-Rails: Can reach both Bathroom Shower/Tub: Chiropodist: Standard Bathroom Accessibility: Yes Home Equipment: None Additional Comments: very independent with bathing/dressing/grooming, was driving  Lives With: Family   Functional History: Prior Function Level of Independence: Independent   Functional Status:  Mobility: Bed Mobility Overal bed mobility: Needs Assistance Bed Mobility: Supine to Sit Supine to sit: Min assist General bed mobility comments: MinA to bring legs around and trunk up to upright Transfers Overall transfer level: Needs assistance Equipment used: Rolling walker (2 wheeled) Transfers: Sit to/from Stand Sit to Stand: Min assist, Min guard, +2 safety/equipment General transfer comment: MinA to minguardA for LLE taking larger steps due to lack of coordination Ambulation/Gait Ambulation/Gait assistance: Min guard, +2 safety/equipment Gait Distance (Feet): 25 Feet(53f + 122fwith standing rest break in between) Assistive device: Rolling walker (2 wheeled) Gait Pattern/deviations:  Step-to pattern, Decreased step length - right, Decreased stance time - left, Decreased stride length, Decreased dorsiflexion - left, Decreased weight shift to left, Trunk flexed General Gait Details: definite loss of proprioception L LE and extended time to walk gait distances, easily fatigued, min guard x2 for safety but could ambulate with min guard of +1 Gait velocity: decreased     ADL: ADL Overall ADL's : Needs assistance/impaired Eating/Feeding: Set up, Sitting Eating/Feeding Details (indicate cue type and reason): with RUE mostly Grooming: Min guard, Standing, Cueing for safety, Cueing for sequencing Upper Body Bathing: Moderate assistance, Standing Lower Body Bathing: Maximal assistance Upper Body Dressing : Moderate assistance, Standing Lower Body Dressing: Maximal assistance, Sitting/lateral leans, Sit to/from stand Toilet Transfer: Minimal assistance, Ambulation, RW Toileting- Clothing Manipulation and Hygiene: Maximal assistance, Cueing for safety, Cueing for sequencing, Sitting/lateral lean, Sit to/from stand Functional mobility during ADLs: Minimal assistance, Rolling walker, Cueing for safety, Cueing for sequencing General ADL Comments: Pt limited by LUE weakness, poor mobility and decreased ability to care for self.   Cognition: Cognition Overall Cognitive Status: Within Functional Limits for tasks assessed Arousal/Alertness: Awake/alert Orientation Level: Oriented X4 Attention: Focused, Sustained Focused Attention: Appears intact(Vigilance WNL: 1/1) Sustained Attention: Appears intact(Serial 7s: 3/3) Memory: Appears intact(Immediate: 5/5; delayed: 5/5) Awareness: Appears intact Executive Function: Reasoning, Sequencing, Organizing Reasoning: Appears intact(Abstraction: 2/2) Sequencing: Impaired Sequencing Impairment: Verbal complex(Clock drawing: 2/3) Organizing: Appears intact(Backward digit span: 1/1) Cognition Arousal/Alertness: Awake/alert Behavior During  Therapy: Flat affect Overall Cognitive Status: Within Functional Limits for tasks assessed   Physical Exam: Blood pressure 132/77, pulse 91, temperature 97.7 F (36.5 C), temperature source Oral, resp. rate 18, height 5' 3"  (1.6 m), weight 105.2 kg, last menstrual period 11/29/2015, SpO2 95 %. Physical Exam  Constitutional: She is oriented to person, place, and time. She appears well-developed and well-nourished. She appears distressed.  obese  HENT:  Head: Normocephalic and atraumatic.  Eyes: Pupils are equal, round, and reactive to light. EOM are normal.  Neck: Normal range of motion. No thyromegaly present.  Cardiovascular: Normal rate and regular rhythm. Exam reveals no friction rub.  No murmur heard. Respiratory: Effort normal. No respiratory distress. She has no wheezes.  GI: Soft. There is no abdominal tenderness.  Musculoskeletal: Normal range of motion.        General: No edema.  Neurological: She is alert and oriented to person, place, and time.  Normal insight and awareness. Functional memory and language. Left central 7 and left facial sensory loss. RUE and RLE 5/5. LUE 4/5 prox to 4-/5 distally. LLE 3+ to 4/ HF, KE, APF, ADF 3/5. DTR's 3+ left. Occasional catch left finger flexors. Sensory 1/2 LUE and LLE.   Skin: Skin is warm and dry.  Psychiatric: She has a normal mood and affect. Her behavior is normal. Judgment and thought content normal.      Lab Results Last 48 Hours  Results for orders placed or performed during the hospital encounter of 05/23/19 (from the past 48 hour(s))  SARS CORONAVIRUS 2 (TAT 6-24 HRS) Nasopharyngeal Nasopharyngeal Swab     Status: None    Collection Time: 05/23/19  3:30 PM    Specimen: Nasopharyngeal Swab  Result Value Ref Range    SARS Coronavirus 2 NEGATIVE NEGATIVE      Comment: (NOTE) SARS-CoV-2 target nucleic acids are NOT DETECTED. The SARS-CoV-2 RNA is generally detectable in upper and lower respiratory specimens during the acute  phase of infection. Negative results do not preclude SARS-CoV-2 infection, do not rule out co-infections with other pathogens, and should not be used as the sole basis for treatment or other patient management decisions. Negative results must be combined with clinical observations, patient history, and epidemiological information. The expected result is Negative. Fact Sheet for Patients: SugarRoll.be Fact Sheet for Healthcare Providers: https://www.woods-mathews.com/ This test is not yet approved or cleared by the [place] FDA and  has been authorized for detection and/or diagnosis of SARS-CoV-2 by FDA under an Emergency Use Authorization (EUA). This EUA will remain  in effect (meaning this test can be used) for the duration of the COVID-19 declaration under Section 56 4(b)(1) of the Act, 21 U.S.C. section 360bbb-3(b)(1), unless the authorization is terminated or revoked sooner. Performed at [facility], [place] [address], [place], [place] [address]    Hemoglobin A1c     Status: Abnormal    Collection Time: 05/24/19  4:05 AM  Result Value Ref Range    Hgb A1c MFr Bld 6.2 (H) 4.8 - 5.6 %      Comment: (NOTE) Pre diabetes:          5.7%-6.4% Diabetes:              >6.4% Glycemic control for   <7.0% adults with diabetes      Mean Plasma Glucose 131.24 mg/dL      Comment: Performed at [facility] [address] [place], [place] [address]  Lipid panel     Status: Abnormal    Collection Time: 05/24/19  4:05 AM  Result Value Ref Range    Cholesterol 228 (H) 0 - 200 mg/dL    Triglycerides 151 (H) <150 mg/dL    HDL 32 (L) >40 mg/dL    Total CHOL/HDL Ratio 7.1 RATIO    VLDL 30 0 - 40 mg/dL    LDL Cholesterol 166 (H) 0 - 99 mg/dL      Comment:        Total Cholesterol/HDL:CHD Risk Coronary Heart Disease Risk Table                     Men   Women  1/2 Average Risk   3.4   3.3  Average Risk       5.0   4.4  2 X Average Risk    9.6   7.1  3 X Average Risk  23.4   11.0  Use the calculated Patient Ratio above and the CHD Risk Table to determine the patient's CHD Risk.        ATP III CLASSIFICATION (LDL):  <100     mg/dL   Optimal  100-129  mg/dL   Near or Above                    Optimal  130-159  mg/dL   Borderline  160-189  mg/dL   High  >190     mg/dL   Very High Performed at [facility] [address], [place], [place] [address]    CBC     Status: Abnormal    Collection Time: 05/24/19  4:05 AM  Result Value Ref Range    WBC 10.7 (H) 4.0 - 10.5 K/uL    RBC 4.94 3.87 - 5.11 MIL/uL    Hemoglobin 12.5 12.0 - 15.0 g/dL    HCT 40.1 36.0 - 46.0 %    MCV 81.2 80.0 - 100.0 fL    MCH 25.3 (L) 26.0 - 34.0 pg    MCHC 31.2 30.0 - 36.0 g/dL    RDW 15.0 11.5 - 15.5 %    Platelets 499 (H) 150 - 400 K/uL    nRBC 0.0 0.0 - 0.2 %      Comment: Performed at [facility], [place] [address], [place], [place] [address]  Basic metabolic panel     Status: Abnormal    Collection Time: 05/24/19  4:05 AM  Result Value Ref Range    Sodium 139 135 - 145 mmol/L    Potassium 3.6 3.5 - 5.1 mmol/L    Chloride 105 98 - 111 mmol/L    CO2 21 (L) 22 - 32 mmol/L    Glucose, Bld 117 (H) 70 - 99 mg/dL    BUN 7 6 - 20 mg/dL    Creatinine, Ser 0.83 0.44 - 1.00 mg/dL    Calcium 9.8 8.9 - 10.3 mg/dL    GFR calc non Af Amer >60 >60 mL/min    GFR calc Af Amer >60 >60 mL/min    Anion gap 13 5 - 15      Comment: Performed at [facility], [place] [address] [place], [place] [address]  HIV Antibody  (Routine Testing)     Status: None    Collection Time: 05/24/19  4:05 AM  Result Value Ref Range    HIV Screen 4th Generation wRfx Non Reactive Non Reactive      Comment: (NOTE) Performed At: [facility] [place], [place] [id number] Rush Farmer MD CB:[id number]        Imaging Results (Last 48 hours)  Mr Angio Head Wo Contrast   Result Date: 05/23/2019 CLINICAL DATA:  Cerebral aneurysm,  subarachnoid hemorrhage, cerebral vasospasm, evaluate left-sided weakness, left facial droop. EXAM: MRI HEAD WITHOUT CONTRAST MRA HEAD WITHOUT CONTRAST TECHNIQUE: Multiplanar, multiecho pulse sequences of the brain and surrounding structures were obtained without intravenous contrast. Angiographic images of the head were obtained using MRA technique without contrast. COMPARISON:  Noncontrast head CT 05/23/2019 FINDINGS: MRI HEAD FINDINGS Brain: Multiple sequences are motion degraded. Acute infarct measuring 2.1 x 0.7 cm involving the right corona radiata/internal capsule and extending inferiorly into the right basal ganglia. Associated T2/FLAIR hyperintensity at this site. Mild scattered T2/FLAIR hyperintensity within the cerebral white matter is nonspecific, but consistent chronic small vessel ischemic disease. No evidence of intracranial mass. No midline shift or extra-axial fluid collection. No  chronic intracranial blood products. Cerebral volume is normal for age. Partially empty sella turcica. Vascular: Reported separately Skull and upper cervical spine: Normal marrow signal. Sinuses/Orbits: Open orbits visualized paranasal sinuses are clear. Visualized mastoid air cells are clear. MRA HEAD FINDINGS The examination is motion degraded. Bilateral ICA siphons patent without high-grade stenosis. Right middle and anterior cerebral arteries patent without high-grade proximal stenosis. Left middle and anterior cerebral arteries patent without high-grade proximal stenosis. Possible 2 mm aneurysm arising from the A2 right anterior cerebral artery on source images (series 3, image 61). However, evaluation is somewhat limited due to motion artifact. Visualized intracranial vertebral arteries are patent without high-grade stenosis. Basilar artery is patent without high-grade stenosis. Bilateral posterior cerebral arteries patent without high-grade proximal stenosis. A right posterior communicating artery is present. Left  posterior communicating is poorly delineated and may be hypoplastic or absent. These results were called by telephone at the time of interpretation on 05/23/2019 at 3:09 pm to provider Dr. Tyrone Nine, Who verbally acknowledged these results. IMPRESSION: MRI brain: 1. Motion degraded examination. 2. 2.1 x 0.7 cm acute infarct involving right corona radiata/internal capsule and extending into the right basal ganglia. 3. Mild chronic small vessel ischemic disease. MRA head: 1. Motion degraded examination. 2. No evidence of intracranial large vessel occlusion or high-grade proximal arterial stenosis. 3. A 2 mm aneurysm arising the A2 right anterior cerebral artery is questioned, although evaluation is somewhat limited due to motion degradation. Consider follow-up CT or MR angiography. Electronically Signed   By: Kellie Simmering   On: 05/23/2019 15:10    Mr Angio Neck W Wo Contrast   Result Date: 05/23/2019 CLINICAL DATA:  Initial evaluation for acute stroke. EXAM: MRA NECK WITHOUT AND WITH CONTRAST TECHNIQUE: Multiplanar and multiecho pulse sequences of the neck were obtained without and with intravenous contrast. Angiographic images of the neck were obtained using MRA technique without and with intravenous contrast. CONTRAST:  100m GADAVIST GADOBUTROL 1 MMOL/ML IV SOLN COMPARISON:  Prior MRI and brain MRA from earlier the same day. FINDINGS: Examination mildly limited by motion artifact. Source images reviewed. AORTIC ARCH: Visualized aortic arch of normal caliber with normal branch pattern. No hemodynamically significant stenosis seen about the origin of the great vessels. Visualized subclavian arteries widely patent. RIGHT CAROTID SYSTEM: Evaluation of the proximal right CCA mildly limited by motion. Visualized right CCA patent to the bifurcation without stenosis. No significant atheromatous narrowing seen about the right bifurcation. Right ICA mildly tortuous but widely patent distally to the circle-of-Willis without  stenosis or occlusion. LEFT CAROTID SYSTEM: Left CCA patent to the bifurcation without stenosis. Evaluation of the left bifurcation mildly limited by motion, with no definite significant stenosis seen. Left ICA mildly tortuous but widely patent distally to the circle-of-Willis without stenosis or occlusion. VERTEBRAL ARTERIES: Both vertebral arteries arise from the subclavian arteries. Vertebral arteries widely patent within the neck without stenosis or vascular occlusion. IMPRESSION: Negative MRA of the neck. No hemodynamically significant or critical flow limiting stenosis. Electronically Signed   By: BJeannine BogaM.D.   On: 05/23/2019 20:11    Mr Brain Wo Contrast (neuro Protocol)   Result Date: 05/23/2019 CLINICAL DATA:  Cerebral aneurysm, subarachnoid hemorrhage, cerebral vasospasm, evaluate left-sided weakness, left facial droop. EXAM: MRI HEAD WITHOUT CONTRAST MRA HEAD WITHOUT CONTRAST TECHNIQUE: Multiplanar, multiecho pulse sequences of the brain and surrounding structures were obtained without intravenous contrast. Angiographic images of the head were obtained using MRA technique without contrast. COMPARISON:  Noncontrast head CT 05/23/2019 FINDINGS: MRI  HEAD FINDINGS Brain: Multiple sequences are motion degraded. Acute infarct measuring 2.1 x 0.7 cm involving the right corona radiata/internal capsule and extending inferiorly into the right basal ganglia. Associated T2/FLAIR hyperintensity at this site. Mild scattered T2/FLAIR hyperintensity within the cerebral white matter is nonspecific, but consistent chronic small vessel ischemic disease. No evidence of intracranial mass. No midline shift or extra-axial fluid collection. No chronic intracranial blood products. Cerebral volume is normal for age. Partially empty sella turcica. Vascular: Reported separately Skull and upper cervical spine: Normal marrow signal. Sinuses/Orbits: Open orbits visualized paranasal sinuses are clear. Visualized  mastoid air cells are clear. MRA HEAD FINDINGS The examination is motion degraded. Bilateral ICA siphons patent without high-grade stenosis. Right middle and anterior cerebral arteries patent without high-grade proximal stenosis. Left middle and anterior cerebral arteries patent without high-grade proximal stenosis. Possible 2 mm aneurysm arising from the A2 right anterior cerebral artery on source images (series 3, image 61). However, evaluation is somewhat limited due to motion artifact. Visualized intracranial vertebral arteries are patent without high-grade stenosis. Basilar artery is patent without high-grade stenosis. Bilateral posterior cerebral arteries patent without high-grade proximal stenosis. A right posterior communicating artery is present. Left posterior communicating is poorly delineated and may be hypoplastic or absent. These results were called by telephone at the time of interpretation on 05/23/2019 at 3:09 pm to provider Dr. Tyrone Nine, Who verbally acknowledged these results. IMPRESSION: MRI brain: 1. Motion degraded examination. 2. 2.1 x 0.7 cm acute infarct involving right corona radiata/internal capsule and extending into the right basal ganglia. 3. Mild chronic small vessel ischemic disease. MRA head: 1. Motion degraded examination. 2. No evidence of intracranial large vessel occlusion or high-grade proximal arterial stenosis. 3. A 2 mm aneurysm arising the A2 right anterior cerebral artery is questioned, although evaluation is somewhat limited due to motion degradation. Consider follow-up CT or MR angiography. Electronically Signed   By: Kellie Simmering   On: 05/23/2019 15:10    Dg Chest Port 1 View   Result Date: 05/23/2019 CLINICAL DATA:  Hemiparesis, shortness of breath EXAM: PORTABLE CHEST 1 VIEW COMPARISON:  None. FINDINGS: The heart size and mediastinal contours are within normal limits. Both lungs are clear. The visualized skeletal structures are unremarkable. IMPRESSION: No active  disease. Electronically Signed   By: Zetta Bills M.D.   On: 05/23/2019 16:49            Medical Problem List and Plan: 1.  Left-sided weakness and hemi-sensory loss secondary to right posterior corona radiata/PL IC infarction likely secondary small vessel disease.  Plan 30-day event monitor as outpatient             -admit to inpatient rehab             -pt reports ?new sensory loss left face/peri-oral. Given SV stroke and symptoms in left arm and leg, this may represent evolution of the infarct. Reassured patient and told her we would observe this weekend.   2.  Antithrombotics: -DVT/anticoagulation: Lovenox             -antiplatelet therapy: Aspirin 81 mg daily, Plavix 75 mg daily x3 weeks then aspirin alone 3. Pain Management: Tylenol as needed 4. Mood: Celexa 20 mg daily, Wellbutrin 300 mg daily             -antipsychotic agents: N/A 5. Neuropsych: This patient is capable of making decisions on her own behalf. 6. Skin/Wound Care: Routine skin checks 7. Fluids/Electrolytes/Nutrition: Routine in and outs with follow-up chemistries  8.  Ulcerative colitis.Lialda 2.4 mg twice daily             -hasn't had a bm for >day which is unusual for her. 9.  Hyperlipidemia.  Lipitor 10.  History of asthma and remote tobacco abuse.  Albuterol nebulizer as needed.       Lavon Paganini Angiulli, PA-C 05/25/2019  I have personally performed a face to face diagnostic evaluation of this patient and formulated the key components of the plan.  Additionally, I have personally reviewed laboratory data, imaging studies, as well as relevant notes and concur with the physician assistant's documentation above.  Meredith Staggers, MD, [facility]

## 2019-05-26 ENCOUNTER — Other Ambulatory Visit: Payer: Self-pay

## 2019-05-26 ENCOUNTER — Inpatient Hospital Stay (HOSPITAL_COMMUNITY): Payer: BC Managed Care – PPO | Admitting: Occupational Therapy

## 2019-05-26 ENCOUNTER — Inpatient Hospital Stay (HOSPITAL_COMMUNITY): Payer: BC Managed Care – PPO

## 2019-05-26 ENCOUNTER — Encounter (HOSPITAL_COMMUNITY): Payer: Self-pay | Admitting: *Deleted

## 2019-05-26 LAB — COMPREHENSIVE METABOLIC PANEL
ALT: 33 U/L (ref 0–44)
AST: 33 U/L (ref 15–41)
Albumin: 3.4 g/dL — ABNORMAL LOW (ref 3.5–5.0)
Alkaline Phosphatase: 78 U/L (ref 38–126)
Anion gap: 8 (ref 5–15)
BUN: 6 mg/dL (ref 6–20)
CO2: 24 mmol/L (ref 22–32)
Calcium: 8.8 mg/dL — ABNORMAL LOW (ref 8.9–10.3)
Chloride: 105 mmol/L (ref 98–111)
Creatinine, Ser: 0.81 mg/dL (ref 0.44–1.00)
GFR calc Af Amer: >60 mL/min (ref >60)
GFR calc non Af Amer: >60 mL/min (ref >60)
Glucose, Bld: 107 mg/dL — ABNORMAL HIGH (ref 70–99)
Potassium: 3.2 mmol/L — ABNORMAL LOW (ref 3.5–5.1)
Sodium: 137 mmol/L (ref 135–145)
Total Bilirubin: 0.4 mg/dL (ref 0.3–1.2)
Total Protein: 6.7 g/dL (ref 6.5–8.1)

## 2019-05-26 LAB — CBC WITH DIFFERENTIAL/PLATELET
Abs Immature Granulocytes: 0.03 10*3/uL (ref 0.00–0.07)
Basophils Absolute: 0.1 10*3/uL (ref 0.0–0.1)
Basophils Relative: 1 %
Eosinophils Absolute: 0.7 10*3/uL — ABNORMAL HIGH (ref 0.0–0.5)
Eosinophils Relative: 10 %
HCT: 36.1 % (ref 36.0–46.0)
Hemoglobin: 11.5 g/dL — ABNORMAL LOW (ref 12.0–15.0)
Immature Granulocytes: 0 %
Lymphocytes Relative: 22 %
Lymphs Abs: 1.6 10*3/uL (ref 0.7–4.0)
MCH: 26 pg (ref 26.0–34.0)
MCHC: 31.9 g/dL (ref 30.0–36.0)
MCV: 81.7 fL (ref 80.0–100.0)
Monocytes Absolute: 0.8 10*3/uL (ref 0.1–1.0)
Monocytes Relative: 11 %
Neutro Abs: 4 10*3/uL (ref 1.7–7.7)
Neutrophils Relative %: 56 %
Platelets: 335 10*3/uL (ref 150–400)
RBC: 4.42 MIL/uL (ref 3.87–5.11)
RDW: 15.4 % (ref 11.5–15.5)
WBC: 7.1 10*3/uL (ref 4.0–10.5)
nRBC: 0 % (ref 0.0–0.2)

## 2019-05-26 MED ORDER — FLUTICASONE PROPIONATE 50 MCG/ACT NA SUSP
1.0000 | Freq: Every day | NASAL | Status: DC
  Administered 2019-05-26 – 2019-06-05 (×11): 1 via NASAL
  Filled 2019-05-26: qty 16

## 2019-05-26 MED ORDER — METHOCARBAMOL 500 MG PO TABS
500.0000 mg | ORAL_TABLET | Freq: Four times a day (QID) | ORAL | Status: DC | PRN
  Administered 2019-05-26 – 2019-06-05 (×25): 500 mg via ORAL
  Filled 2019-05-26 (×28): qty 1

## 2019-05-26 MED ORDER — POTASSIUM CHLORIDE CRYS ER 20 MEQ PO TBCR
20.0000 meq | EXTENDED_RELEASE_TABLET | Freq: Every day | ORAL | Status: DC
  Administered 2019-05-26 – 2019-06-06 (×12): 20 meq via ORAL
  Filled 2019-05-26 (×12): qty 1

## 2019-05-26 MED ORDER — GUAIFENESIN-DM 100-10 MG/5ML PO SYRP
10.0000 mL | ORAL_SOLUTION | Freq: Four times a day (QID) | ORAL | Status: DC | PRN
  Administered 2019-05-26 – 2019-05-28 (×7): 10 mL via ORAL
  Filled 2019-05-26 (×9): qty 10

## 2019-05-26 NOTE — Evaluation (Signed)
Physical Therapy Assessment and Plan  Patient Details  Name: Carol Johnson MRN: [id number] Date of Birth: October 27, 1965  PT Diagnosis: Abnormal posture, Abnormality of gait, Coordination disorder, Difficulty walking, Edema, Hemiparesis non-dominant, Hemiplegia non-dominant, Hypertonia, Impaired sensation, Muscle spasms and Muscle weakness Rehab Potential: Excellent ELOS: 2-2.5 weeks   Today's Date: 05/26/2019 PT Individual Time: 8756-4332 PT Individual Time Calculation (min): 63 min    Problem List:  Patient Active Problem List   Diagnosis Date Noted  . Small vessel cerebrovascular accident (CVA) ([place]) 05/25/2019  . CVA (cerebral vascular accident) ([place]) 05/23/2019  . Thrombocytosis ([place]) 05/23/2019  . Coronary artery disease involving native coronary artery of native heart without angina pectoris 03/21/2018  . Coronary artery calcification 02/10/2018  . Morbid obesity ([place]) 02/10/2018  . Smoker 01/15/2018  . Dyspnea on exertion 12/21/2017  . Carpal tunnel syndrome 12/21/2017  . Stress incontinence 12/21/2017  . Paresthesia of both feet 07/04/2017  . Always thirsty 07/04/2017  . Abdominal bloating 12/01/2015  . Fatigue 12/01/2015  . Vitamin B12 deficiency 12/01/2015  . Lymphocytic colitis 01/16/2015  . Anemia, iron deficiency 01/16/2015  . Anxiety state 12/20/2014  . Diarrhea 11/27/2014  . Insomnia 11/27/2014  . History of DVT (deep vein thrombosis) 09/17/2014  . OSA (obstructive sleep apnea) 09/02/2014  . Gastritis, chronic 06/06/2014  . Unspecified gastritis and gastroduodenitis without mention of hemorrhage 05/03/2014  . Abdominal pain, epigastric 04/19/2014  . Internal hemorrhoids with other complication 95/18/8416  . Routine general medical examination at a health care facility 10/14/2011  . Thyroid nodule 05/16/2011  . Rectal bleeding 11/24/2010  . Hemorrhoids, internal 11/24/2010  . Irritable bowel syndrome (IBS) 11/24/2010  . ANEMIA 10/02/2009  . Hyperlipidemia  07/18/2009  . Essential hypertension 07/18/2009  . DYSPNEA ON EXERTION 07/18/2009  . THYROID CYST 01/31/2008  . ANXIETY 01/31/2008  . Depression with anxiety 01/31/2008  . GASTRIC ULCER, HX OF 01/31/2008  . FIBROIDS, UTERUS 11/13/2007  . ALLERGIC RHINITIS 11/13/2007  . ASTHMA 11/13/2007  . GERD 11/13/2007  . IRRITABLE BOWEL SYNDROME 11/13/2007  . INTERNAL HEMORRHOIDS 11/12/2003    Past Medical History:  Past Medical History:  Diagnosis Date  . Abdominal pain, unspecified site   . Allergic rhinitis   . Anxiety   . Asthma   . Depression   . DVT (deep venous thrombosis) ([place]) 2016  . Fibroid uterus   . GERD (gastroesophageal reflux disease)   . Hypercholesterolemia   . Hypertension   . Internal hemorrhoid   . Irritable bowel syndrome   . Lymphocytic colitis 01/02/2015  . Pneumonia 1998  . Stroke ([place])   . Ulcerative colitis ([place])   . URI (upper respiratory infection)   . Uterine fibroid   . Vitamin B12 deficiency    Past Surgical History:  Past Surgical History:  Procedure Laterality Date  . CESAREAN SECTION  2004  . CHOLECYSTECTOMY     laparoscopic "gallstones"  . ESOPHAGOGASTRODUODENOSCOPY (EGD) WITH PROPOFOL N/A 05/03/2014   Procedure: ESOPHAGOGASTRODUODENOSCOPY (EGD) WITH PROPOFOL;  Surgeon: Inda Castle, MD;  Location: [facility];  Service: Endoscopy;  Laterality: N/A;  . MYOMECTOMY    . UTERINE FIBROID SURGERY      Assessment & Plan Clinical Impression: Patient is a 53 y.o. year old right-handed female history of hypertension, ulcerative colitis, B12 deficiency, DVT 2016 left posterior tibial vein contributed to estrogen use and smoking she was on Xarelto and has been since completed, hyperlipidemia, asthma, and quit smoking 17 months ago.. Per chart review patient independent prior to admission  to level home with bed and bath upstairs 6 steps to entry. She lives with family in good support. Presented 05/23/2019 with left-sided weakness. Cranial CT scan  negative. MRI showed a 2 cm acute infarct right corona radiata internal capsule extending into the right basal ganglia. MRA head with no LVO but concerning for possible 2 mm aneurysm from right A2 division of the anterior cerebral artery. MRA of neck unremarkable for any acute occlusion. Echocardiogram with ejection fraction of 65%. Neurology follow-up placed on aspirin and Plavix x3 weeks then aspirin alone. Subcutaneous Lovenox for DVT prophylaxis. Recommendations for 30-day cardiac event monitor as outpatient. Tolerating a regular consistency diet. Therapy evaluations completed and patient was admitted for a comprehensive rehab program Patient transferred to [facility] on 05/25/2019 .   Patient currently requires min with mobility secondary to muscle weakness, decreased cardiorespiratoy endurance, impaired timing and sequencing, abnormal tone, unbalanced muscle activation, decreased coordination and decreased motor planning and decreased sitting balance, decreased standing balance, decreased postural control, hemiplegia and decreased balance strategies.  Prior to hospitalization, patient was independent  with mobility and lived with Family in a House home.  Home access is 6 steps to enter, bedroom on second floor, has bilateral railings on each side at her house, plan to d/c to sisters with level entry and bedroom on first floorStairs to enter, Level entry.  Patient will benefit from skilled PT intervention to maximize safe functional mobility, minimize fall risk and decrease caregiver burden for planned discharge home with 24 hour supervision.  Anticipate patient will benefit from follow up [place] at discharge.  PT - End of Session Activity Tolerance: Tolerates 30+ min activity with multiple rests Endurance Deficit: Yes Endurance Deficit Description: required rest breaks after minimal to moderate activity PT Assessment Rehab Potential (ACUTE/IP ONLY): Excellent PT Barriers to Discharge: Home environment  access/layout PT Barriers to Discharge Comments: Patient with multi level home and bedroom on second floor, states she is thinking she will stay with her sister who has a level entry and bedroom on the first floor and can provide 24/7 assistance PT Patient demonstrates impairments in the following area(s): Balance;Edema;Endurance;Motor;Nutrition;Pain;Perception;Safety;Sensory;Skin Integrity PT Transfers Functional Problem(s): Bed Mobility;Bed to Chair;Car;Furniture;Floor PT Locomotion Functional Problem(s): Ambulation;Wheelchair Mobility;Stairs PT Plan PT Intensity: Minimum of 1-2 x/day ,45 to 90 minutes PT Frequency: 5 out of 7 days PT Duration Estimated Length of Stay: 2-2.5 weeks PT Treatment/Interventions: Ambulation/gait training;Discharge planning;Functional mobility training;Psychosocial support;Therapeutic Activities;Balance/vestibular training;Disease management/prevention;Neuromuscular re-education;Skin care/wound management;Therapeutic Exercise;Wheelchair propulsion/positioning;DME/adaptive equipment instruction;Pain management;Splinting/orthotics;UE/LE Strength taining/ROM;Community reintegration;Functional electrical stimulation;Patient/family education;Stair training;UE/LE Coordination activities PT Transfers Anticipated Outcome(s): Supervision with LRAD PT Locomotion Anticipated Outcome(s): Supervision 150' with LRAD PT Recommendation Recommendations for Other Services: Therapeutic Recreation consult Therapeutic Recreation Interventions: Stress management Follow Up Recommendations: Home health PT Patient destination: Home Equipment Recommended: To be determined Equipment Details: Has no equipment at home, will continue to assess patient's needs as she progresses  Skilled Therapeutic Intervention In addition to the PT evaluation below, the patient performed the following skilled PT interventions: Patient in bed upon PT arrival. Patient alert and agreeable to PT session. Patient  denied pain, but reported increased chest discomfort and fatigue from chest congestion and cough, RN already aware.   Therapeutic Activity: Bed Mobility: Patient performed rolling R/L and supine to/from sit with supervision for safety. Provided verbal cues for scooting back before rolling to avoiding rolling close to the edge of bed. Transfers: Patient performed sit to/from stand x3, stand pivot x1, toilet transfer x1, and a car transfer x1 with min  A using a RW. Performed sit to/from stand x1 without RW with min A, patient expressed fear of falling in standing without RW. Provided verbal cues for leaning forward to stand, hand placement on RW, and reaching back to sit for safety and controlled descent. Required supervision for peri-care and min A for LB dressing during toileting.  Gait Training:  Patient ambulated 15 feet x2 to/from the bathroom and 95 feet x1 using RW with min A-CGA. Ambulated as described below, increased L LE deviations with fatigue. Provided verbal cues for erect posture, looking ahead, decreased step height on L, increased weight shift to L with manual facilitation, and progressing to step-to gait pattern as able (patient very reluctant to try this). Patient reports history of L knee buckling, no signs of buckling during ambulation today, however.  Patient went up/down 4-6" steps using B rails with min A, ascending leading with the R and descending leading with the L with step-to gait pattern. Provided cues for sequencing, weight shifting to the L, and L foot placement on descent.   Wheelchair Mobility:  Patient propelled wheelchair 105 feet, limited by fatigue, with supervision using B UEs x5 feet with poor control of L UE, then R UE and LE for remainder with improved speed and coordination with propulsion. Provided verbal cues for R hemi technique, turning technique, and attending to her L arm to avoid injury.  Patient in bed at end of session with breaks locked, bed alarm set,  and all needs within reach.   Instructed pt in results of PT evaluation as detailed below, PT POC, rehab potential, rehab goals, and discharge recommendations. Additionally discussed CIR's policies regarding fall safety and use of chair alarm and/or quick release belt. Pt verbalized understanding and in agreement. Will update pt's family members as they become available.    PT Evaluation Precautions/Restrictions Precautions Precautions: Fall;Other (comment) Precaution Comments: L weakness Restrictions Weight Bearing Restrictions: No General   Vital SignsTherapy Vitals Temp: 98.7 F (37.1 C) Pulse Rate: 91 Resp: 16 BP: 119/72 Patient Position (if appropriate): Lying Oxygen Therapy SpO2: 93 % O2 Device: Room Air Pain   Home Living/Prior Functioning Home Living Available Help at Discharge: Family;Available 24 hours/day(sister) Type of Home: House Home Access: Stairs to enter;Level entry Entrance Stairs-Number of Steps: 6 steps to enter, bedroom on second floor, has bilateral railings on each side at her house, plan to d/c to sisters with level entry and bedroom on first floor Entrance Stairs-Rails: Can reach both Home Layout: Two level;Bed/bath upstairs Alternate Level Stairs-Number of Steps: flight L rail (at her house) Alternate Level Stairs-Rails: Can reach both Bathroom Shower/Tub: Government social research officer Accessibility: Yes Additional Comments: very independent with bathing/dressing/grooming, was driving  Lives With: Family Prior Function  Able to Take Stairs?: Reciprically Driving: Yes Vision/Perception  Perception Perception: Within Functional Limits Praxis Praxis: Intact  Cognition Overall Cognitive Status: Within Functional Limits for tasks assessed Orientation Level: Oriented X4 Attention: Focused;Sustained Focused Attention: Appears intact Sustained Attention: Appears intact Memory: Appears intact Awareness: Appears  intact Problem Solving: Appears intact Reasoning: Appears intact Sequencing: Appears intact Organizing: Appears intact Safety/Judgment: Appears intact Sensation Sensation Light Touch: Impaired Detail Central sensation comments: tingling in L UE from finger tips to just proximal to the elbow Light Touch Impaired Details: Impaired LUE Proprioception: Impaired by gross assessment(decreased L LE proprioception with gait) Coordination Gross Motor Movements are Fluid and Coordinated: No Fine Motor Movements are Fluid and Coordinated: No Coordination and Movement Description: L hemi with poor  proprioception and motor planning and spasticity on the L UE and LE Finger Nose Finger Test: slow and deliberate on L Heel Shin Test: slow and deliberate on L Motor  Motor Motor: Hemiplegia;Abnormal tone;Clonus Motor - Skilled Clinical Observations: L hemi with poor motor planning and increased tone in L UE and LE, + for clonus on L 3 beats before fatigue  Mobility Bed Mobility Bed Mobility: Rolling Right;Rolling Left;Supine to Sit;Sit to Supine Rolling Right: Supervision/verbal cueing Rolling Left: Supervision/Verbal cueing Supine to Sit: Supervision/Verbal cueing Sit to Supine: Supervision/Verbal cueing Transfers Transfers: Sit to Stand;Stand to Sit;Stand Pivot Transfers Sit to Stand: Minimal Assistance - Patient > 75% Stand to Sit: Minimal Assistance - Patient > 75% Stand Pivot Transfers: Minimal Assistance - Patient > 75% Stand Pivot Transfer Details: Verbal cues for gait pattern;Verbal cues for safe use of DME/AE;Verbal cues for sequencing;Verbal cues for technique;Manual facilitation for weight shifting;Verbal cues for precautions/safety Transfer (Assistive device): Rolling walker Locomotion  Gait Ambulation: Yes Gait Assistance: Minimal Assistance - Patient > 75% Gait Distance (Feet): 95 Feet Assistive device: Rolling walker Gait Assistance Details: Verbal cues for gait pattern;Verbal  cues for technique;Visual cues for safe use of DME/AE;Verbal cues for precautions/safety Gait Gait: Yes Gait Pattern: Decreased step length - right;Decreased stance time - left;Step-to pattern;Decreased weight shift to left;Left steppage;Left flexed knee in stance;Left foot flat;Decreased trunk rotation;Narrow base of support;Trunk flexed(leading with L) Gait velocity: decreased Stairs / Additional Locomotion Stairs: Yes Stairs Assistance: Minimal Assistance - Patient > 75% Stair Management Technique: Two rails Number of Stairs: 4 Height of Stairs: 6 Wheelchair Mobility Wheelchair Mobility: Yes Wheelchair Assistance: Chartered loss adjuster: Right upper extremity;Right lower extremity Wheelchair Parts Management: Needs assistance Distance: 105'  Trunk/Postural Assessment  Cervical Assessment Cervical Assessment: Exceptions to WFL(forward head) Thoracic Assessment Thoracic Assessment: Exceptions to WFL(rounded shoulders) Lumbar Assessment Lumbar Assessment: Exceptions to WFL(posterior pelvic tilt) Postural Control Postural Control: Deficits on evaluation(decreased/delayed)  Balance Balance Balance Assessed: Yes Static Sitting Balance Static Sitting - Balance Support: No upper extremity supported;Feet supported Static Sitting - Level of Assistance: 5: Stand by assistance Dynamic Sitting Balance Dynamic Sitting - Balance Support: No upper extremity supported Dynamic Sitting - Level of Assistance: 5: Stand by assistance Dynamic Sitting - Balance Activities: Lateral lean/weight shifting;Forward lean/weight shifting;Reaching for objects Static Standing Balance Static Standing - Balance Support: Right upper extremity supported;Left upper extremity supported Static Standing - Level of Assistance: 4: Min assist Dynamic Standing Balance Dynamic Standing - Balance Support: Right upper extremity supported;Left upper extremity supported Dynamic Standing - Level of  Assistance: 4: Min assist Dynamic Standing - Balance Activities: Lateral lean/weight shifting;Reaching for objects;Forward lean/weight shifting Extremity Assessment      RLE Assessment RLE Assessment: Within Functional Limits Active Range of Motion (AROM) Comments: WFL for all functional mobility General Strength Comments: Grossly in sitting: 5/5 throughout LLE Assessment LLE Assessment: Exceptions to [facility] Active Range of Motion (AROM) Comments: Hip flexion limited to ~110 degrees in sitting, otherwise WFL for all functional mobility with increased time/effort to acheive full ROM General Strength Comments: Grossly in sitting: 4/5 througout    Refer to Care Plan for Long Term Goals  Recommendations for other services: Therapeutic Recreation  Stress management  Discharge Criteria: Patient will be discharged from PT if patient refuses treatment 3 consecutive times without medical reason, if treatment goals not met, if there is a change in medical status, if patient makes no progress towards goals or if patient is discharged from hospital.  The above assessment, treatment  plan, treatment alternatives and goals were discussed and mutually agreed upon: by patient  Doreene Burke PT, DPT  05/26/2019, 4:05 PM

## 2019-05-26 NOTE — Progress Notes (Signed)
[place] PHYSICAL MEDICINE & REHABILITATION PROGRESS NOTE   Subjective/Complaints: Pt with numerous complaints since admit including, cough, cold symptoms, muscle spasms, insomnia  ROS: Patient denies fever, rash, sore throat, blurred vision, nausea, vomiting, diarrhea,   shortness of breath or chest pain,  headache, or mood change.    Objective:   No results found. Recent Labs    05/24/19 0405 05/26/19 0601  WBC 10.7* 7.1  HGB 12.5 11.5*  HCT 40.1 36.1  PLT 499* 335   Recent Labs    05/24/19 0405 05/26/19 0601  NA 139 137  K 3.6 3.2*  CL 105 105  CO2 21* 24  GLUCOSE 117* 107*  BUN 7 6  CREATININE 0.83 0.81  CALCIUM 9.8 8.8*   No intake or output data in the 24 hours ending 05/26/19 0933   Physical Exam: Vital Signs Blood pressure 117/73, pulse 84, temperature 98.4 F (36.9 C), temperature source Oral, resp. rate 18, height 5' 3"  (1.6 m), weight 105.1 kg, last menstrual period 11/29/2015, SpO2 95 %. Constitutional: No distress . Vital signs reviewed. HEENT: EOMI, oral membranes moist. congested Neck: supple Cardiovascular: RRR without murmur. No JVD    Respiratory: CTA Bilaterally without wheezes or rales. Normal effort    GI: BS +, non-tender, non-distended  Musculoskeletal:Normal range of motion.  General: No edema.  Neurological: She isalertand oriented to person, place, and time. Normal insight and awareness. Functional memory and language. Left central 7 and left facial sensory loss. RUE and RLE 5/5. LUE 4/5 prox to 4-/5 distally. LLE 3+ to 4/ HF, KE, APF, ADF 3/5. DTR's 3+ left. Occasional catch left finger flexors. Sensory 1/2 LUE and LLE.--motor and sensory stable Skin: Skin iswarmand dry.  Psychiatric: flat    Assessment/Plan: 1. Functional deficits secondary to right  CR, PLIC infarct which require 3+ hours per day of interdisciplinary therapy in a comprehensive inpatient rehab setting.  Physiatrist is providing close team  supervision and 24 hour management of active medical problems listed below.  Physiatrist and rehab team continue to assess barriers to discharge/monitor patient progress toward functional and medical goals  Care Tool:  Bathing  Bathing activity did not occur: Safety/medical concerns           Bathing assist       Upper Body Dressing/Undressing Upper body dressing Upper body dressing/undressing activity did not occur (including orthotics): N/A(patient already have gown on)      Upper body assist      Lower Body Dressing/Undressing Lower body dressing    Lower body dressing activity did not occur: N/A(patient not using incontinence brief on, just bedpad)       Lower body assist       Toileting Toileting    Toileting assist Assist for toileting: Moderate Assistance - Patient 50 - 74%     Transfers Chair/bed transfer  Transfers assist           Locomotion Ambulation   Ambulation assist              Walk 10 feet activity   Assist           Walk 50 feet activity   Assist           Walk 150 feet activity   Assist           Walk 10 feet on uneven surface  activity   Assist           Wheelchair     Assist  Wheelchair 50 feet with 2 turns activity    Assist            Wheelchair 150 feet activity     Assist          Blood pressure 117/73, pulse 84, temperature 98.4 F (36.9 C), temperature source Oral, resp. rate 18, height 5' 3"  (1.6 m), weight 105.1 kg, last menstrual period 11/29/2015, SpO2 95 %.  Medical Problem List and Plan: 1.Left-sided weaknessand hemi-sensory losssecondary toright posterior corona radiata/PL IC infarction likely secondary small vessel disease. Plan 30-day event monitor as outpatient -Patient is beginning CIR therapies today including PT and OT    2.  Antithrombotics: -DVT/anticoagulation:Lovenox -antiplatelet therapy: Aspirin 81 mg daily, Plavix 75 mg daily x3 weeks then aspirin alone 3. Pain Management:Tylenol as needed  -add robaxin prn for spasms 4. Mood:Celexa 20 mg daily, Wellbutrin 300 mg daily -antipsychotic agents: N/A  -ambien for sleep 5. Neuropsych: This patientiscapable of making decisions on herown behalf. 6. Skin/Wound Care:Routine skin checks 7. Fluids/Electrolytes/Nutrition:Routine in and outs with follow-up chemistries 8. Ulcerative colitis.Lialda2.4 mg twice daily -hasn't had a bm for > 2 days ---follow today. 9. Hyperlipidemia. Lipitor 10. History of asthma and remote tobacco abuse. Albuterol nebulizer as needed.  -add nasonex and claritin for congestion  -continue robitussin for cough    LOS: 1 days A FACE TO FACE EVALUATION WAS PERFORMED  Meredith Staggers 05/26/2019, 9:33 AM

## 2019-05-26 NOTE — Progress Notes (Signed)
Upon assessment at 2300, pt c/o of congestion and chest pressure, pt has productive cough, runny nose, scratchy throat. Pt requesting medication to help get rid of the congestion. Pt denies any other pain. On call provider, Oval Linsey contacted for medication order. Verbal order for 10 ml Q6h Robitussin, 5 mg Ambien PRN HS. All VS stable. Call light in reach. Will continue to monitor.

## 2019-05-26 NOTE — Evaluation (Signed)
Occupational Therapy Assessment and Plan  Patient Details  Name: Carol Johnson MRN: [id number] Date of Birth: September 24, 1965  OT Diagnosis: abnormal posture, cognitive deficits, hemiplegia affecting non-dominant side and muscle weakness (generalized) Rehab Potential: Rehab Potential (ACUTE ONLY): Excellent ELOS: 14-16 days   Today's Date: 05/26/2019 OT Individual Time: 4098-1191 OT Individual Time Calculation (min): 60 min     Problem List:  Patient Active Problem List   Diagnosis Date Noted  . Small vessel cerebrovascular accident (CVA) ([place]) 05/25/2019  . CVA (cerebral vascular accident) ([place]) 05/23/2019  . Thrombocytosis ([place]) 05/23/2019  . Coronary artery disease involving native coronary artery of native heart without angina pectoris 03/21/2018  . Coronary artery calcification 02/10/2018  . Morbid obesity ([place]) 02/10/2018  . Smoker 01/15/2018  . Dyspnea on exertion 12/21/2017  . Carpal tunnel syndrome 12/21/2017  . Stress incontinence 12/21/2017  . Paresthesia of both feet 07/04/2017  . Always thirsty 07/04/2017  . Abdominal bloating 12/01/2015  . Fatigue 12/01/2015  . Vitamin B12 deficiency 12/01/2015  . Lymphocytic colitis 01/16/2015  . Anemia, iron deficiency 01/16/2015  . Anxiety state 12/20/2014  . Diarrhea 11/27/2014  . Insomnia 11/27/2014  . History of DVT (deep vein thrombosis) 09/17/2014  . OSA (obstructive sleep apnea) 09/02/2014  . Gastritis, chronic 06/06/2014  . Unspecified gastritis and gastroduodenitis without mention of hemorrhage 05/03/2014  . Abdominal pain, epigastric 04/19/2014  . Internal hemorrhoids with other complication 47/82/9562  . Routine general medical examination at a health care facility 10/14/2011  . Thyroid nodule 05/16/2011  . Rectal bleeding 11/24/2010  . Hemorrhoids, internal 11/24/2010  . Irritable bowel syndrome (IBS) 11/24/2010  . ANEMIA 10/02/2009  . Hyperlipidemia 07/18/2009  . Essential hypertension 07/18/2009  . DYSPNEA ON  EXERTION 07/18/2009  . THYROID CYST 01/31/2008  . ANXIETY 01/31/2008  . Depression with anxiety 01/31/2008  . GASTRIC ULCER, HX OF 01/31/2008  . FIBROIDS, UTERUS 11/13/2007  . ALLERGIC RHINITIS 11/13/2007  . ASTHMA 11/13/2007  . GERD 11/13/2007  . IRRITABLE BOWEL SYNDROME 11/13/2007  . INTERNAL HEMORRHOIDS 11/12/2003    Past Medical History:  Past Medical History:  Diagnosis Date  . Abdominal pain, unspecified site   . Allergic rhinitis   . Anxiety   . Asthma   . Depression   . DVT (deep venous thrombosis) ([place]) 2016  . Fibroid uterus   . GERD (gastroesophageal reflux disease)   . Hypercholesterolemia   . Hypertension   . Internal hemorrhoid   . Irritable bowel syndrome   . Lymphocytic colitis 01/02/2015  . Pneumonia 1998  . Stroke ([place])   . Ulcerative colitis ([place])   . URI (upper respiratory infection)   . Uterine fibroid   . Vitamin B12 deficiency    Past Surgical History:  Past Surgical History:  Procedure Laterality Date  . CESAREAN SECTION  2004  . CHOLECYSTECTOMY     laparoscopic "gallstones"  . ESOPHAGOGASTRODUODENOSCOPY (EGD) WITH PROPOFOL N/A 05/03/2014   Procedure: ESOPHAGOGASTRODUODENOSCOPY (EGD) WITH PROPOFOL;  Surgeon: Inda Castle, MD;  Location: [facility];  Service: Endoscopy;  Laterality: N/A;  . MYOMECTOMY    . UTERINE FIBROID SURGERY      Assessment & Plan Clinical Impression: Patient is a 53 y.o. year old female with recent admission to the hospital on  05/23/2019 with left-sided weakness. Cranial CT scan negative. MRI showed a 2 cm acute infarct right corona radiata internal capsule extending into the right basal ganglia. MRA head with no LVO but concerning for possible 2 mm aneurysm from right A2 division  of the anterior cerebral artery.   Patient transferred to CIR on 05/25/2019 .    Patient currently requires mod with basic self-care skills secondary to muscle weakness, impaired timing and sequencing, abnormal tone, unbalanced muscle  activation and decreased coordination, decreased safety awareness and decreased sitting balance, decreased standing balance, decreased postural control, hemiplegia and decreased balance strategies.  Prior to hospitalization, patient could complete ADLs and IADLs with independent .  Patient will benefit from skilled intervention to decrease level of assist with basic self-care skills and increase independence with basic self-care skills prior to discharge home with care partner.  Anticipate patient will require intermittent supervision and follow up home health.  OT - End of Session Activity Tolerance: Decreased this session Endurance Deficit: Yes Endurance Deficit Description: Pt with increased fatigue after ADL tasks OT Assessment Rehab Potential (ACUTE ONLY): Excellent OT Patient demonstrates impairments in the following area(s): Balance;Endurance;Motor;Safety OT Basic ADL's Functional Problem(s): Grooming;Bathing;Dressing;Toileting;Eating OT Advanced ADL's Functional Problem(s): Simple Meal Preparation OT Transfers Functional Problem(s): Tub/Shower;Toilet OT Additional Impairment(s): Fuctional Use of Upper Extremity OT Plan OT Intensity: Minimum of 1-2 x/day, 45 to 90 minutes OT Frequency: 5 out of 7 days OT Duration/Estimated Length of Stay: 14-16 days OT Treatment/Interventions: Balance/vestibular training;Cognitive remediation/compensation;Discharge planning;DME/adaptive equipment instruction;Disease mangement/prevention;Functional mobility training;Pain management;Self Care/advanced ADL retraining;Therapeutic Activities;UE/LE Coordination activities;Patient/family education;Therapeutic Exercise;Neuromuscular re-education;UE/LE Strength taining/ROM OT Self Feeding Anticipated Outcome(s): independent OT Basic Self-Care Anticipated Outcome(s): supervision OT Toileting Anticipated Outcome(s): supervision OT Bathroom Transfers Anticipated Outcome(s): supervision OT  Recommendation Recommendations for Other Services: Neuropsych consult Patient destination: Home Follow Up Recommendations: 24 hour supervision/assistance Equipment Recommended: To be determined   Skilled Therapeutic Intervention Pt worked on selfcare retraining sit to stand from the wheelchair during session.  She was able to complete UB bathing with min assist overall and max assist for hand over hand assistance to wash the RUE using the LUE.  She exhibited increased flexor tone in the digits as well as extensor tone in the elbow extensors.  She was able to demonstrate AROM at all joints of the LUE but demonstrates greater distal movement compared to the shoulder.  Min assist for LB bathing sit to stand.  Mod assist for donning pants and slip on shoes.  Mod facilitation was needed for functional mobility from the bed to the toilet with hand held assist.  Slight impulsivity noted initially with sit to stand as pt did not wait for therapist to be beside of her.  Finished session with pt sitting in the wheelchair with call button and phone in reach and NT in to take pt back to the bathroom.      OT Evaluation Precautions/Restrictions  Precautions Precautions: Fall;Other (comment) Precaution Comments: L weakness Restrictions Weight Bearing Restrictions: No  Pain  No report of pain  Home Living/Prior Functioning Home Living Family/patient expects to be discharged to:: Private residence Living Arrangements: Other relatives Available Help at Discharge: Family, Available 24 hours/day Type of Home: House Home Access: Stairs to enter [employer] of Steps: 6 steps to enter, bedroom on second floor, has bilateral railings on each side at her house, plan to d/c to sisters with level entry and bedroom on first floor Entrance Stairs-Rails: Can reach both Home Layout: Two level, Bed/bath upstairs Alternate Level Stairs-Number of Steps: flight L rail (at her house) Alternate Level  Stairs-Rails: Can reach both Bathroom Shower/Tub: Chiropodist: Standard Bathroom Accessibility: Yes Additional Comments: very independent with bathing/dressing/grooming, was driving  Lives With: Family IADL History Homemaking Responsibilities: Yes  Current License: Yes Occupation: Full time employment Type of Occupation: Web designer for [employer] Prior Function Level of Independence: Independent with basic ADLs  Able to Take Stairs?: Reciprically Driving: Yes ADL ADL Eating: Set up Where Assessed-Eating: Wheelchair Grooming: Minimal assistance Where Assessed-Grooming: Standing at sink Upper Body Bathing: Minimal assistance Where Assessed-Upper Body Bathing: Wheelchair Lower Body Bathing: Minimal assistance Where Assessed-Lower Body Bathing: Wheelchair Upper Body Dressing: Minimal assistance Where Assessed-Upper Body Dressing: Wheelchair Lower Body Dressing: Moderate assistance Where Assessed-Lower Body Dressing: Wheelchair Toileting: Minimal assistance Where Assessed-Toileting: Glass blower/designer: Moderate assistance Toilet Transfer Method: Ambulating Vision Baseline Vision/History: Wears glasses Wears Glasses: At all times Patient Visual Report: No change from baseline Vision Assessment?: No apparent visual deficits(Will be further examined in OT treatments) Perception  Perception: Within Functional Limits Praxis Praxis: Intact Cognition Overall Cognitive Status: Impaired/Different from baseline Arousal/Alertness: Awake/alert Orientation Level: Person;Place;Situation Person: Oriented Place: Oriented Situation: Oriented Year: 2020 Month: September Day of Week: Correct Memory: Appears intact Immediate Memory Recall: Sock;Blue;Bed Memory Recall Sock: Without Cue Memory Recall Blue: Without Cue Memory Recall Bed: Without Cue Attention: Focused;Sustained;Selective Focused Attention: Appears intact Sustained Attention:  Appears intact Selective Attention: Appears intact Awareness: Appears intact Problem Solving: Appears intact Reasoning: Appears intact Sequencing: Appears intact Organizing: Appears intact Safety/Judgment: Impaired Comments: Pt impulsive during session at times with sit to stand, not having therapist next to her initially.  Pt also resistant to wearing brief, even though she reported bladder incontinence with coughing and sneezing. Sensation Sensation Light Touch: Impaired Detail Central sensation comments: tingling in L UE from finger tips to just proximal to the elbow Light Touch Impaired Details: Impaired LUE Proprioception: Appears Intact(Proprioception intact in LUE.) Stereognosis: Not tested Additional Comments: Pt was able to detect light touch in the LUE and arm but reports less acuity compared to the right. Coordination Gross Motor Movements are Fluid and Coordinated: No Fine Motor Movements are Fluid and Coordinated: No Coordination and Movement Description: LUE currently Brunnstrum stage IV in the arm and stage V in the hand.  More proximal weakness in the shoulder noted compared to the fingers.  She needed max hand over hand to integrate as an active assist with bathing tasks. Finger Nose Finger Test: slow and deliberate on L Heel Shin Test: slow and deliberate on L Motor  Motor Motor: Hemiplegia Motor - Skilled Clinical Observations: left hemiparesis Mobility  Bed Mobility Bed Mobility: Supine to Sit Rolling Right: Supervision/verbal cueing Rolling Left: Supervision/Verbal cueing Supine to Sit: Contact Guard/Touching assist Sit to Supine: Supervision/Verbal cueing Transfers Sit to Stand: Minimal Assistance - Patient > 75% Stand to Sit: Minimal Assistance - Patient > 75%  Trunk/Postural Assessment  Cervical Assessment Cervical Assessment: Exceptions to WFL(forward head) Thoracic Assessment Thoracic Assessment: Exceptions to WFL(rounded shoulders) Lumbar  Assessment Lumbar Assessment: Exceptions to WFL(posterior pelvic tilt) Postural Control Postural Control: Deficits on evaluation(decreased/delayed)  Balance Balance Balance Assessed: Yes Static Sitting Balance Static Sitting - Balance Support: Feet supported Static Sitting - Level of Assistance: 5: Stand by assistance Dynamic Sitting Balance Dynamic Sitting - Balance Support: During functional activity Dynamic Sitting - Level of Assistance: 5: Stand by assistance Dynamic Sitting - Balance Activities: Lateral lean/weight shifting;Forward lean/weight shifting;Reaching for objects Static Standing Balance Static Standing - Balance Support: During functional activity Static Standing - Level of Assistance: 4: Min assist Dynamic Standing Balance Dynamic Standing - Balance Support: During functional activity Dynamic Standing - Level of Assistance: 3: Mod assist Dynamic Standing - Balance Activities: Lateral lean/weight shifting;Reaching for objects;Forward  lean/weight shifting Extremity/Trunk Assessment RUE Assessment RUE Assessment: Within Functional Limits LUE Assessment LUE Assessment: Exceptions to [facility] General Strength Comments: Brunnstrum stage IV in the arm and stage V in the hand.  She needs max hand over hand to wash the RUE with the LUE.  Gross digit flexion and extension present as well as ability to oppose the thumb to all digits.  She does exhibit greater active movement in the left elbow, wrist, and hand compared to the shoulder. LUE Body System: Neuro LUE Tone LUE Tone: Mild;Modified Ashworth Body Part - Modified Ashworth Scale: Fingers Fingers - Modified Ashworth Scale for Grading Hypertonia LUE: Slight increase in muscle tone, manifested by a catch and release or by minimal resistance at the end of the range of motion when the affected part(s) is moved in flexion or extension     Refer to Care Plan for Long Term Goals  Recommendations for other services:  Neuropsych   Discharge Criteria: Patient will be discharged from OT if patient refuses treatment 3 consecutive times without medical reason, if treatment goals not met, if there is a change in medical status, if patient makes no progress towards goals or if patient is discharged from hospital.  The above assessment, treatment plan, treatment alternatives and goals were discussed and mutually agreed upon: by patient  Gem Conkle OTR/L 05/26/2019, 5:27 PM

## 2019-05-26 NOTE — Progress Notes (Signed)
Occupational Therapy Session Note  Patient Details  Name: Carol Johnson MRN: [id number] Date of Birth: 1966/03/16  Today's Date: 05/26/2019 OT Individual Time: 1443-1536 OT Individual Time Calculation (min): 53 min    Short Term Goals: Week 1:  OT Short Term Goal 1 (Week 1): Pt will complete LB bathing sit to stand with min guard assist. OT Short Term Goal 2 (Week 1): Pt will use the LUE as an active assist for bathing tasks with no more than mod assist. OT Short Term Goal 3 (Week 1): Pt will complete toilet transfer with min guard assist using the RW for support. OT Short Term Goal 4 (Week 1): Pt will complete LB dressing with min assist sit to stand.  Skilled Therapeutic Interventions/Progress Updates:   Pt in bed to start session.  Had her complete transfer to sitting with supervision from elevated bed of approximately 20 degrees.  She was able to complete stand pivot transfers to the wheelchair as well as the mat down in the therapy gym with min assist.  Worked on education of self AAROM exercises for the LUE with handout provided.  Pt able to demonstrate appropriate completion with min assist for shoulder flexion, internal and external rotation, and elbow flexion.  Pt demonstrates AROM for wrist extension and digit flexion/extension.  Had pt work on reciprical scooting with LUE in weightbearing as well as functional reaching with the LUE on a built up surface and pt maintaining squat position.  She was able to remove 2" plastic pegs with the LUE at knee level with min assist.  Returned to the room at end of the session with transfer back to the bed with min assist.  Call button and phone in reach with safety belt in place.    Therapy Documentation Precautions:  Precautions Precautions: Fall, Other (comment) Precaution Comments: L weakness Restrictions Weight Bearing Restrictions: No  Pain: Pain Assessment Pain Score: 0-No pain   Therapy/Group: Individual Therapy  Chantay Whitelock  OTR/L 05/26/2019, 5:48 PM

## 2019-05-27 NOTE — Progress Notes (Signed)
[facility] PHYSICAL MEDICINE & REHABILITATION PROGRESS NOTE   Subjective/Complaints: Pt had a reasonable night. Cough/cold symptoms better. Therapy went well. Left arm still feels tight/heavy  ROS: Patient denies fever, rash, sore throat, blurred vision, nausea, vomiting, diarrhea,   shortness of breath or chest pain, joint or back pain, headache, or mood change.    Objective:   No results found. Recent Labs    05/26/19 0601  WBC 7.1  HGB 11.5*  HCT 36.1  PLT 335   Recent Labs    05/26/19 0601  NA 137  K 3.2*  CL 105  CO2 24  GLUCOSE 107*  BUN 6  CREATININE 0.81  CALCIUM 8.8*    Intake/Output Summary (Last 24 hours) at 05/27/2019 0936 Last data filed at 05/26/2019 1850 Gross per 24 hour  Intake 463 ml  Output -  Net 463 ml     Physical Exam: Vital Signs Blood pressure 122/65, pulse 83, temperature 97.6 F (36.4 C), temperature source Oral, resp. rate 20, height 5' 3"  (1.6 m), weight 105.1 kg, last menstrual period 11/29/2015, SpO2 98 %. Constitutional: No distress . Vital signs reviewed. HEENT: EOMI, oral membranes moist Neck: supple Cardiovascular: RRR without murmur. No JVD    Respiratory: CTA Bilaterally without wheezes or rales. Normal effort    GI: BS +, non-tender, non-distended   Musculoskeletal:Normal range of motion.  General: No edema.  Neurological: She isalertand oriented to person, place, and time. Normal insight and awareness. Functional memory and language. Left central 7 and left facial sensory loss. RUE and RLE 5/5. LUE 4/5 prox to 3+/5 distally. LLE 3+ to 4/ HF, KE, APF, ADF 3/5. DTR's 3+ left. Occasional catch left finger flexors/wrist/elbow. Sensory 1/2 LUE and LLE.--motor and sensory stable Skin: Skin iswarmand dry.  Psychiatric: flat    Assessment/Plan: 1. Functional deficits secondary to right  CR, PLIC infarct which require 3+ hours per day of interdisciplinary therapy in a comprehensive inpatient rehab  setting.  Physiatrist is providing close team supervision and 24 hour management of active medical problems listed below.  Physiatrist and rehab team continue to assess barriers to discharge/monitor patient progress toward functional and medical goals  Care Tool:  Bathing  Bathing activity did not occur: Safety/medical concerns Body parts bathed by patient: Left arm, Chest, Abdomen, Front perineal area, Right upper leg, Left upper leg, Right lower leg, Left lower leg, Face   Body parts bathed by helper: Buttocks, Right arm     Bathing assist Assist Level: Minimal Assistance - Patient > 75%     Upper Body Dressing/Undressing Upper body dressing Upper body dressing/undressing activity did not occur (including orthotics): N/A(patient already have gown on) What is the patient wearing?: Pull over shirt    Upper body assist Assist Level: Minimal Assistance - Patient > 75%    Lower Body Dressing/Undressing Lower body dressing    Lower body dressing activity did not occur: N/A(patient not using incontinence brief on, just bedpad) What is the patient wearing?: Pants     Lower body assist Assist for lower body dressing: Moderate Assistance - Patient 50 - 74%     Toileting Toileting    Toileting assist Assist for toileting: Moderate Assistance - Patient 50 - 74%     Transfers Chair/bed transfer  Transfers assist     Chair/bed transfer assist level: Contact Guard/Touching assist     Locomotion Ambulation   Ambulation assist      Assist level: Moderate Assistance - Patient 50 - 74% Assistive device: Hand  held assist Max distance: 10'   Walk 10 feet activity   Assist     Assist level: Minimal Assistance - Patient > 75% Assistive device: Walker-rolling   Walk 50 feet activity   Assist    Assist level: Minimal Assistance - Patient > 75% Assistive device: Walker-rolling    Walk 150 feet activity   Assist Walk 150 feet activity did not occur:  Safety/medical concerns(decreased strength/activity tolerance)         Walk 10 feet on uneven surface  activity   Assist Walk 10 feet on uneven surfaces activity did not occur: Safety/medical concerns(decreased strength/activity tolerance)         Wheelchair     Assist   Type of Wheelchair: Manual    Wheelchair assist level: Set up assist, Supervision/Verbal cueing Max wheelchair distance: 105'    Wheelchair 50 feet with 2 turns activity    Assist        Assist Level: Set up assist, Supervision/Verbal cueing   Wheelchair 150 feet activity     Assist  Wheelchair 150 feet activity did not occur: Safety/medical concerns(decreased strength/activity tolerance)       Blood pressure 122/65, pulse 83, temperature 97.6 F (36.4 C), temperature source Oral, resp. rate 20, height 5' 3"  (1.6 m), weight 105.1 kg, last menstrual period 11/29/2015, SpO2 98 %.  Medical Problem List and Plan: 1.Left-sided weaknessand hemi-sensory losssecondary toright posterior corona radiata/PL IC infarction likely secondary small vessel disease. Plan 30-day event monitor as outpatient -Patient is beginning CIR therapies today including PT and OT  -sl flexor tone in LUE, don't think she needs splinting as she has enough strength to oppose tightness. Continue to monitor. Encouraged her to continue AROM/PROM exercises  2. Antithrombotics: -DVT/anticoagulation:Lovenox -antiplatelet therapy: Aspirin 81 mg daily, Plavix 75 mg daily x3 weeks then aspirin alone 3. Pain Management:Tylenol as needed  -added robaxin prn for spasms 4. Mood:Celexa 20 mg daily, Wellbutrin 300 mg daily -antipsychotic agents: N/A  -ambien for sleep 5. Neuropsych: This patientiscapable of making decisions on herown behalf. 6. Skin/Wound Care:Routine skin checks 7. Fluids/Electrolytes/Nutrition:Routine in and outs with follow-up chemistries 8.  Ulcerative colitis.Lialda2.4 mg twice daily -hasn't had a bm for > 2 days ---follow today. 9. Hyperlipidemia. Lipitor 10. History of asthma and remote tobacco abuse. Albuterol nebulizer as needed.  -added nasonex and claritin for congestion   -continue robitussin for cough   -improved today    LOS: 2 days A FACE TO FACE EVALUATION WAS PERFORMED  Meredith Staggers 05/27/2019, 9:36 AM

## 2019-05-28 ENCOUNTER — Inpatient Hospital Stay (HOSPITAL_COMMUNITY): Payer: BC Managed Care – PPO

## 2019-05-28 ENCOUNTER — Other Ambulatory Visit: Payer: Self-pay | Admitting: Cardiology

## 2019-05-28 ENCOUNTER — Inpatient Hospital Stay (HOSPITAL_COMMUNITY): Payer: BC Managed Care – PPO | Admitting: Physical Therapy

## 2019-05-28 DIAGNOSIS — I63031 Cerebral infarction due to thrombosis of right carotid artery: Secondary | ICD-10-CM

## 2019-05-28 NOTE — Progress Notes (Signed)
Inpatient Rehabilitation  Patient information reviewed and entered into eRehab system by Devyne Hauger M. Girtie Wiersma, M.A., CCC/SLP, PPS Coordinator.  Information including medical coding, functional ability and quality indicators will be reviewed and updated through discharge.    

## 2019-05-28 NOTE — Progress Notes (Signed)
Responded to Spiritual Consult for HCPOA.  Carol Johnson had just returned from PT and had hoped to rest during this time.  I will return with HCPOA later today.  De Burrs Chaplain Resident Pager [phone]

## 2019-05-28 NOTE — Progress Notes (Signed)
[facility] PHYSICAL MEDICINE & REHABILITATION PROGRESS NOTE   Subjective/Complaints: Pt reports her LUE>LLE still feel tight- just took Baclofen (actually Robaxin)- which she reports is q6 hours prn. She reports it makes her sleepy but helps.  Has had a "cold" for "ages" and feels she needs a CXR or "Antibiotics" since been sick "so long". Explained I'm happy to do CXR, however I don't think it's appropriate to treat unless it's (+).    ROS: Patient denies fever, rash, sore throat, blurred vision, nausea, vomiting, diarrhea,   shortness of breath or chest pain, joint or back pain, headache, or mood change.    Objective:   Dg Chest 2 View  Result Date: 05/28/2019 CLINICAL DATA:  Cough, congestion EXAM: CHEST - 2 VIEW COMPARISON:  05/23/2019 FINDINGS: The heart size and mediastinal contours are within normal limits. Both lungs are clear. The visualized skeletal structures are unremarkable. IMPRESSION: No active cardiopulmonary disease. Electronically Signed   By: Davina Poke M.D.   On: 05/28/2019 12:34   Recent Labs    05/26/19 0601  WBC 7.1  HGB 11.5*  HCT 36.1  PLT 335   Recent Labs    05/26/19 0601  NA 137  K 3.2*  CL 105  CO2 24  GLUCOSE 107*  BUN 6  CREATININE 0.81  CALCIUM 8.8*    Intake/Output Summary (Last 24 hours) at 05/28/2019 1259 Last data filed at 05/28/2019 0754 Gross per 24 hour  Intake 966 ml  Output -  Net 966 ml     Physical Exam: Vital Signs Blood pressure 131/70, pulse 66, temperature 97.8 F (36.6 C), temperature source Oral, resp. rate 18, height 5' 3"  (1.6 m), weight 105.1 kg, last menstrual period 11/29/2015, SpO2 96 %. Constitutional: No distress . Vital signs and CXR reviewed. Sitting up in bed; rubbing LUE. NAD; sniffing a little, but no other cold Sx's. HEENT: EOMI, oral membranes moist Neck: supple Cardiovascular: RRR without murmur. No JVD    Respiratory: CTA Bilaterally without wheezes or rales. Normal effort    GI: BS +,  non-tender, non-distended   Musculoskeletal:Normal range of motion.  General: No edema.  Neurological: She isalertand oriented to person, place, and time. Normal insight and awareness. Functional memory and language. Left central 7 and left facial sensory loss. RUE and RLE 5/5. LUE 4/5 prox to 3+/5 distally. LLE 3+ to 4/ HF, KE, APF, ADF 3/5. DTR's 3+ left. Occasional catch left finger flexors/wrist/elbow. Sensory 1/2 LUE and LLE.--MAS of 1+ to 2 in LUE- esp elbow and wrist and MAS of 1 in LLE. Skin: Skin iswarmand dry.  Psychiatric: flat    Assessment/Plan: 1. Functional deficits secondary to right  CR, PLIC infarct which require 3+ hours per day of interdisciplinary therapy in a comprehensive inpatient rehab setting.  Physiatrist is providing close team supervision and 24 hour management of active medical problems listed below.  Physiatrist and rehab team continue to assess barriers to discharge/monitor patient progress toward functional and medical goals  Care Tool:  Bathing  Bathing activity did not occur: Safety/medical concerns Body parts bathed by patient: Left arm, Chest, Abdomen, Front perineal area, Right upper leg, Left upper leg, Right lower leg, Left lower leg, Face   Body parts bathed by helper: Buttocks, Right arm     Bathing assist Assist Level: Minimal Assistance - Patient > 75%     Upper Body Dressing/Undressing Upper body dressing Upper body dressing/undressing activity did not occur (including orthotics): N/A(patient already have gown on) What is the  patient wearing?: Dress    Upper body assist Assist Level: Minimal Assistance - Patient > 75%    Lower Body Dressing/Undressing Lower body dressing    Lower body dressing activity did not occur: N/A(patient not using incontinence brief on, just bedpad) What is the patient wearing?: Pants     Lower body assist Assist for lower body dressing: Minimal Assistance - Patient > 75%      Toileting Toileting    Toileting assist Assist for toileting: Supervision/Verbal cueing     Transfers Chair/bed transfer  Transfers assist     Chair/bed transfer assist level: Minimal Assistance - Patient > 75%     Locomotion Ambulation   Ambulation assist      Assist level: Minimal Assistance - Patient > 75% Assistive device: Walker-rolling Max distance: 50 ft   Walk 10 feet activity   Assist     Assist level: Minimal Assistance - Patient > 75% Assistive device: Walker-rolling   Walk 50 feet activity   Assist    Assist level: Minimal Assistance - Patient > 75% Assistive device: Walker-rolling    Walk 150 feet activity   Assist Walk 150 feet activity did not occur: Safety/medical concerns(decreased strength/activity tolerance)         Walk 10 feet on uneven surface  activity   Assist Walk 10 feet on uneven surfaces activity did not occur: Safety/medical concerns(decreased strength/activity tolerance)         Wheelchair     Assist   Type of Wheelchair: Manual    Wheelchair assist level: Set up assist, Supervision/Verbal cueing Max wheelchair distance: 105'    Wheelchair 50 feet with 2 turns activity    Assist        Assist Level: Set up assist, Supervision/Verbal cueing   Wheelchair 150 feet activity     Assist  Wheelchair 150 feet activity did not occur: Safety/medical concerns(decreased strength/activity tolerance)       Blood pressure 131/70, pulse 66, temperature 97.8 F (36.6 C), temperature source Oral, resp. rate 18, height 5' 3"  (1.6 m), weight 105.1 kg, last menstrual period 11/29/2015, SpO2 96 %.  Medical Problem List and Plan: 1.Left-sided weaknessand hemi-sensory losssecondary toright posterior corona radiata/PL IC infarction likely secondary small vessel disease. Plan 30-day event monitor as outpatient -Patient is beginning CIR therapies today including PT and OT  -sl  flexor tone in LUE, don't think she needs splinting as she has enough strength to oppose tightness. Continue to monitor. Encouraged her to continue AROM/PROM exercises   9/28- con't Robaxin prn 2. Antithrombotics: -DVT/anticoagulation:Lovenox -antiplatelet therapy: Aspirin 81 mg daily, Plavix 75 mg daily x3 weeks then aspirin alone 3. Pain Management:Tylenol as needed  -added robaxin prn for spasms 4. Mood:Celexa 20 mg daily, Wellbutrin 300 mg daily -antipsychotic agents: N/A  -ambien for sleep 5. Neuropsych: This patientiscapable of making decisions on herown behalf. 6. Skin/Wound Care:Routine skin checks 7. Fluids/Electrolytes/Nutrition:Routine in and outs with follow-up chemistries 8. Ulcerative colitis.Lialda2.4 mg twice daily -hasn't had a bm for > 2 days ---follow today. 9. Hyperlipidemia. Lipitor 10. History of asthma and remote tobacco abuse. Albuterol nebulizer as needed.  -added nasonex and claritin for congestion   -continue robitussin for cough   -improved today  9/28- CXR (-) for anything acute- con't regimen.    LOS: 3 days A FACE TO FACE EVALUATION WAS PERFORMED  Mikyle Sox 05/28/2019, 12:59 PM

## 2019-05-28 NOTE — Progress Notes (Signed)
Occupational Therapy Session Note  Patient Details  Name: Carol Johnson MRN: [id number] Date of Birth: 02/09/1966  Today's Date: 05/28/2019 OT Individual Time: 1410-1504 OT Individual Time Calculation (min): 54 min    Short Term Goals: Week 1:  OT Short Term Goal 1 (Week 1): Pt will complete LB bathing sit to stand with min guard assist. OT Short Term Goal 2 (Week 1): Pt will use the LUE as an active assist for bathing tasks with no more than mod assist. OT Short Term Goal 3 (Week 1): Pt will complete toilet transfer with min guard assist using the RW for support. OT Short Term Goal 4 (Week 1): Pt will complete LB dressing with min assist sit to stand.   Skilled Therapeutic Interventions/Progress Updates:    Pt received sitting EOB talking on the telephone with no c/o pain and agreeable to tx with OT. Pt completes EOB <> w/c transfer with RW and min A for balance. Pt transported to therapy apartment and completed functional reaching task while standing. Pt completes task with CGA and min VC for weight shifting and L knee extension. Pt able to complete functional reaching to above head cabinet, approx 110 degrees, with L UE and reaching across midline to grasp items on counter. Pt engaged in functional  Painting task while standing for 10 minutes with CGA for balance and min VC for weight shifting to increase activity tolerance and increase use of L hand. Pt completed painting using small paint brush in L hand to promote increased fine motor grasp and dexterity. End of session, pt requested to remain in w/c; safety belt on and all needs met.   Therapy Documentation Precautions:  Precautions Precautions: Fall, Other (comment) Precaution Comments: L weakness Restrictions Weight Bearing Restrictions: No      Therapy/Group: Individual Therapy  Lan Entsminger 05/28/2019, 5:08 PM

## 2019-05-28 NOTE — Progress Notes (Signed)
Social Work Assessment and Plan   Patient Details  Name: Carol Johnson MRN: [id number] Date of Birth: August 15, 1966  Today's Date: 05/28/2019  Problem List:  Patient Active Problem List   Diagnosis Date Noted  . Small vessel cerebrovascular accident (CVA) ([place]) 05/25/2019  . CVA (cerebral vascular accident) ([place]) 05/23/2019  . Thrombocytosis ([place]) 05/23/2019  . Coronary artery disease involving native coronary artery of native heart without angina pectoris 03/21/2018  . Coronary artery calcification 02/10/2018  . Morbid obesity ([place]) 02/10/2018  . Smoker 01/15/2018  . Dyspnea on exertion 12/21/2017  . Carpal tunnel syndrome 12/21/2017  . Stress incontinence 12/21/2017  . Paresthesia of both feet 07/04/2017  . Always thirsty 07/04/2017  . Abdominal bloating 12/01/2015  . Fatigue 12/01/2015  . Vitamin B12 deficiency 12/01/2015  . Lymphocytic colitis 01/16/2015  . Anemia, iron deficiency 01/16/2015  . Anxiety state 12/20/2014  . Diarrhea 11/27/2014  . Insomnia 11/27/2014  . History of DVT (deep vein thrombosis) 09/17/2014  . OSA (obstructive sleep apnea) 09/02/2014  . Gastritis, chronic 06/06/2014  . Unspecified gastritis and gastroduodenitis without mention of hemorrhage 05/03/2014  . Abdominal pain, epigastric 04/19/2014  . Internal hemorrhoids with other complication 56/38/[id number]  . Routine general medical examination at a health care facility 10/14/2011  . Thyroid nodule 05/16/2011  . Rectal bleeding 11/24/2010  . Hemorrhoids, internal 11/24/2010  . Irritable bowel syndrome (IBS) 11/24/2010  . ANEMIA 10/02/2009  . Hyperlipidemia 07/18/2009  . Essential hypertension 07/18/2009  . DYSPNEA ON EXERTION 07/18/2009  . THYROID CYST 01/31/2008  . ANXIETY 01/31/2008  . Depression with anxiety 01/31/2008  . GASTRIC ULCER, HX OF 01/31/2008  . FIBROIDS, UTERUS 11/13/2007  . ALLERGIC RHINITIS 11/13/2007  . ASTHMA 11/13/2007  . GERD 11/13/2007  . IRRITABLE BOWEL SYNDROME 11/13/2007   . INTERNAL HEMORRHOIDS 11/12/2003   Past Medical History:  Past Medical History:  Diagnosis Date  . Abdominal pain, unspecified site   . Allergic rhinitis   . Anxiety   . Asthma   . Depression   . DVT (deep venous thrombosis) ([place]) 2016  . Fibroid uterus   . GERD (gastroesophageal reflux disease)   . Hypercholesterolemia   . Hypertension   . Internal hemorrhoid   . Irritable bowel syndrome   . Lymphocytic colitis 01/02/2015  . Pneumonia 1998  . Stroke ([place])   . Ulcerative colitis ([place])   . URI (upper respiratory infection)   . Uterine fibroid   . Vitamin B12 deficiency    Past Surgical History:  Past Surgical History:  Procedure Laterality Date  . CESAREAN SECTION  2004  . CHOLECYSTECTOMY     laparoscopic "gallstones"  . ESOPHAGOGASTRODUODENOSCOPY (EGD) WITH PROPOFOL N/A 05/03/2014   Procedure: ESOPHAGOGASTRODUODENOSCOPY (EGD) WITH PROPOFOL;  Surgeon: Inda Castle, MD;  Location: [facility];  Service: Endoscopy;  Laterality: N/A;  . MYOMECTOMY    . UTERINE FIBROID SURGERY     Social History:  reports that she quit smoking about 17 months ago. Her smoking use included cigarettes. She has a 1.00 pack-year smoking history. She has never used smokeless tobacco. She reports current alcohol use of about 5.0 standard drinks of alcohol per week. She reports that she does not use drugs.  Family / Support Systems Marital Status: (but "estranged" per pt) Patient Roles: Parent Spouse/Significant Other: Pt notes that she is estranged from spouse and does not wish him to be contacted. Children: son, (16 yrs) Rock Nephew Other Supports: sister, Dutch Gray @ [phone];  brother Lorenza Evangelist Anticipated Caregiver: Hildred Laser,  to provide primary support Ability/Limitations of Caregiver: NA Caregiver Availability: 24/7 Family Dynamics: Pt notes that she is very close with her son and siblings.  She and son were living with her brother and his family PTA.  Plan to go to sister's home  at d/c due to better accessibility.  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 Legal History/Current Legal Issues: None Guardian/Conservator: None - per MD, pt is capable of making decisions on her own behalf.   Abuse/Neglect Abuse/Neglect Assessment Can Be Completed: Yes Physical Abuse: Denies Verbal Abuse: Denies Sexual Abuse: Denies Exploitation of patient/patient's resources: Denies Self-Neglect: Denies  Emotional Status Pt's affect, behavior and adjustment status: Pt sitting up in w/c and completes assessment interview without much difficulty.  She is tearful when speaking about her son and wanting him to be allowed to visit.  She admits frustraion and concerns with a stroke at her age and focused on taking any steps need to prevent another. She denies any significant emotional distress, however, feel will benefit from additional support with neuropsychology consult. Recent Psychosocial Issues: Marital stressors/ separation Psychiatric History: Pt confirms h/o depression and anxiety.  No formal counseling.  Meds via primary MD. Substance Abuse History: NA  Patient / Family Perceptions, Expectations & Goals Pt/Family understanding of illness & functional limitations: Pt and family with good understanding of her stroke and current functional limitations/ need for CIR. Premorbid pt/family roles/activities: Pt completely independent and working f/t. Anticipated changes in roles/activities/participation: Per goals of supervision, sister to assume primary caregivre role. Pt/family expectations/goals: "I just want to get through this and get home to my son."  [employer]: None Premorbid Home Care/DME Agencies: None Transportation available at discharge: yes Resource referrals recommended: Neuropsychology, Support group (specify)  Discharge Planning Living Arrangements: Children, Other relatives Support Systems: Children, Other relatives Type of Residence: Private residence Insurance Resources: Multimedia programmer (specify)([place] BCBS) Museum/gallery curator Resources: Employment Museum/gallery curator Screen Referred: No Living Expenses: Lives with family Money Management: Patient Does the patient have any problems obtaining your medications?: No Home Management: Pt and family share responsibilities. Patient/Family Preliminary Plans: Pt to d/c home with sister in [place] which is a better accessibility and she will provide all needed supervision Social Work Anticipated Follow Up Needs: HH/OP Expected length of stay: 14-16 days  Clinical Impression Unfortunate woman here following a CVA but making good progress and supervision goals set.  Good support from her siblings and her 79 yr old son.  Some tearfulness when speaking of not being able to see her son due to restrictions.  She is motivated for therapies.  Will involve neuropsychology while here.  Denessa Cavan 05/28/2019, 3:45 PM

## 2019-05-28 NOTE — IPOC Note (Signed)
Overall Plan of Care [facility]) Patient Details Name: Carol Johnson MRN: [id number] DOB: 07-12-1966  Admitting Diagnosis: Small vessel cerebrovascular accident (CVA) [facility])  Hospital Problems: Principal Problem:   Small vessel cerebrovascular accident (CVA) ([place])     Functional Problem List: Nursing    PT Balance, Edema, Endurance, Motor, Nutrition, Pain, Perception, Safety, Sensory, Skin Integrity  OT Balance, Endurance, Motor, Safety  SLP    TR         Basic ADL's: OT Grooming, Bathing, Dressing, Toileting, Eating     Advanced  ADL's: OT Simple Meal Preparation     Transfers: PT Bed Mobility, Bed to Chair, Car, Furniture, Floor  OT Tub/Shower, Agricultural engineer: PT Ambulation, Emergency planning/management officer, Stairs     Additional Impairments: OT Fuctional Use of Upper Extremity  SLP        TR      Anticipated Outcomes Item Anticipated Outcome  Self Feeding independent  Swallowing      Basic self-care  supervision  Toileting  supervision   Bathroom Transfers supervision  Bowel/Bladder     Transfers  Supervision with LRAD  Locomotion  Supervision 150' with LRAD  Communication     Cognition     Pain     Safety/Judgment      Therapy Plan: PT Intensity: Minimum of 1-2 x/day ,45 to 90 minutes PT Frequency: 5 out of 7 days PT Duration Estimated Length of Stay: 2-2.5 weeks OT Intensity: Minimum of 1-2 x/day, 45 to 90 minutes OT Frequency: 5 out of 7 days OT Duration/Estimated Length of Stay: 14-16 days     Due to the current state of emergency, patients may not be receiving their 3-hours of Medicare-mandated therapy.   Team Interventions: Nursing Interventions    PT interventions Ambulation/gait training, Discharge planning, Functional mobility training, Psychosocial support, Therapeutic Activities, Balance/vestibular training, Disease management/prevention, Neuromuscular re-education, Skin care/wound management, Therapeutic Exercise, Wheelchair  propulsion/positioning, DME/adaptive equipment instruction, Pain management, Splinting/orthotics, UE/LE Strength taining/ROM, Community reintegration, Technical sales engineer stimulation, Patient/family education, IT trainer, UE/LE Coordination activities  OT Interventions Training and development officer, Cognitive remediation/compensation, Discharge planning, DME/adaptive equipment instruction, Disease mangement/prevention, Functional mobility training, Pain management, Self Care/advanced ADL retraining, Therapeutic Activities, UE/LE Coordination activities, Patient/family education, Therapeutic Exercise, Neuromuscular re-education, UE/LE Strength taining/ROM  SLP Interventions    TR Interventions    SW/CM Interventions Discharge Planning, Psychosocial Support, Patient/Family Education   Barriers to Discharge MD  Medical stability, Home enviroment access/loayout, Neurogenic bowel and bladder, Medication compliance and Behavior  Nursing      PT Home environment access/layout pt with multilevel home but now planning to d/c to sister's house who has a multilevel but all needed rooms on main level with level entry to house  OT      SLP      SW       Team Discharge Planning: Destination: PT-Home ,OT- Home , SLP-  Projected Follow-up: PT-Home health PT, OT-  24 hour supervision/assistance, SLP-  Projected Equipment Needs: PT-To be determined, OT- To be determined, SLP-  Equipment Details: PT-Has no equipment at home, will continue to assess patient's needs as she progresses, OT-  Patient/family involved in discharge planning: PT- Patient,  OT-Patient, SLP-   MD ELOS: 14-17 days Medical Rehab Prognosis:  Good Assessment: Pt is a 53 yr old female with L hemiparesis due to R PL IC stroke due to small vessel disease, with associated spasticity, ulcerative colitis, and asthma with a possible upper respiratory viral syndrome currently that we are  treating symptomatically. She also has  depression.  The goals upon d/c are supervision for going home.    See Team Conference Notes for weekly updates to the plan of care

## 2019-05-28 NOTE — Plan of Care (Signed)
  Problem: Consults Goal: RH STROKE PATIENT EDUCATION Description: See Patient Education module for education specifics  Outcome: Progressing   Problem: RH SKIN INTEGRITY Goal: RH STG MAINTAIN SKIN INTEGRITY WITH ASSISTANCE Description: STG Maintain Skin Integrity With min Assistance. Outcome: Progressing Goal: RH STG ABLE TO PERFORM INCISION/WOUND CARE W/ASSISTANCE Description: STG Able To Perform Wound Care With min Assistance. Outcome: Progressing   Problem: RH SAFETY Goal: RH STG ADHERE TO SAFETY PRECAUTIONS W/ASSISTANCE/DEVICE Description: STG Adhere to Safety Precautions With min Assistance and appropriate assistive Device. Outcome: Progressing   Problem: RH PAIN MANAGEMENT Goal: RH STG PAIN MANAGED AT OR BELOW PT'S PAIN GOAL Description: <3 on a 0-10 pain scale Outcome: Progressing   Problem: RH KNOWLEDGE DEFICIT Goal: RH STG INCREASE KNOWLEDGE OF HYPERTENSION Description: Patient will demonstrate knowledge of HTN medications, dietary restrictions, follow-up care with the MD post discharge with min assist from staff. Outcome: Progressing Goal: RH STG INCREASE KNOWLEGDE OF HYPERLIPIDEMIA Description: Patient will demonstrate knowledge of HLD medications, dietary restrictions, follow-up care with the MD post discharge with min assist from staff.  Outcome: Progressing Goal: RH STG INCREASE KNOWLEDGE OF STROKE PROPHYLAXIS Description: Patient will demonstrate knowledge of Stroke prevention medications, dietary restrictions, follow-up care with the MD post discharge with min assist from staff.  Outcome: Progressing

## 2019-05-28 NOTE — Progress Notes (Signed)
Occupational Therapy Session Note  Patient Details  Name: Carol Johnson MRN: [id number] Date of Birth: 02-21-1966  Today's Date: 05/28/2019 OT Individual Time: 1045-1200 OT Individual Time Calculation (min): 75 min    Short Term Goals: Week 1:  OT Short Term Goal 1 (Week 1): Pt will complete LB bathing sit to stand with min guard assist. OT Short Term Goal 2 (Week 1): Pt will use the LUE as an active assist for bathing tasks with no more than mod assist. OT Short Term Goal 3 (Week 1): Pt will complete toilet transfer with min guard assist using the RW for support. OT Short Term Goal 4 (Week 1): Pt will complete LB dressing with min assist sit to stand.  Skilled Therapeutic Interventions/Progress Updates:    Pt received in bed supine with no c/o pain but did report she is still fighting the head cold. Pt comes EOB with (S) and sit<>stand with min A for balance. Pt ambulates with RW to shower with min A for balance. Pt completed UB bathing with (S) and LB bathing with min A with OT assisting with balance during sit <>stand to wash buttocks. Pt cued to use L hand in bathing task to hold washcloth and use HOH A for washing R UE and armpit. Pt is min A for UB dressing and required cuing to use hemi technique. Pt is mod A for donning shoes requiring A to force heel into slippers. Pt performed oral care while seated in w/c at sink level with (S) and demonstrated use of L UE to hold and squeeze toothpaste. Pt used hairdryer to style hair with min A to dry back of head. Pt encouraged to use HOH A with L hand but reports fatigue in B UE. Pt transported to therapy gym and completed Dynavision while seated in w/c. Pt required min facilitation for shoulder flexion and no significant difference was noted in L versus R UE. Education provided regarding compensatory movement. End of session, pt went down for X-ray.    Therapy Documentation Precautions:  Precautions Precautions: Fall, Other (comment) Precaution  Comments: L weakness Restrictions Weight Bearing Restrictions: No Pain: Pain Assessment Pain Scale: 0-10 Pain Score: 4  Pain Type: Acute pain Pain Location: Arm Pain Orientation: Left Pain Descriptors / Indicators: Spasm Pain Frequency: Intermittent Pain Onset: On-going Patients Stated Pain Goal: 2 Pain Intervention(s): Medication (See eMAR)   Therapy/Group: Individual Therapy  Tully Burgo 05/28/2019, 12:30 PM

## 2019-05-28 NOTE — Progress Notes (Signed)
Physical Therapy Session Note  Patient Details  Name: Carol Johnson MRN: [id number] Date of Birth: 1966/06/14  Today's Date: 05/28/2019 PT Individual Time: 0807-0900 PT Individual Time Calculation (min): 53 min   Short Term Goals: Week 1:  PT Short Term Goal 1 (Week 1): Patient will perform transfer with CGA. PT Short Term Goal 2 (Week 1): Patient will ambulate >100 feet with CGA using LRAD. PT Short Term Goal 3 (Week 1): Patient will perform dynamic standing balance with CGA without UE support.  Skilled Therapeutic Interventions/Progress Updates:  Pt received in bed, reporting "feeling like crap" 2/2 chest cold/congestion but agreeable to tx. Pt transfers supine>sitting EOB with supervision, HOB elevated, bed rails. Therapist assisted pt with donning shorts, ted hose & shoes for time management with pt requiring min assist for standing balance. Pt transfers bed>w/c with min assist stand pivot. Transported pt to/from gym via w/c dependent assist for time management. Pt transfers sit<>stand with min assist and cuing for hand placement on armrests. Pt ambulates 40 ft with RW & min assist with stiffness & ataxia noted in LLE. Pt utilized nu-step on level 2 x 5 minutes with BLE only to attempt to loosen up LLE joints. Pt then ambulates 25 ft with RW & min assist with less stiffness noted in LLE but more ataxia. Pt with c/o ongoing cramping in LUE & LLE & notes she's receiving muscle relaxers for this; tested for tone and none noted in LLE in sitting. Pt ambulates additional 50 ft with RW & min assist. Pt utilized kinetron in sitting, 1 minute x 3 with task focusing on LLE strengthening & NMR via forced use. Progressed to using kinetron in standing with BUE support & min assist for balance with increasing height of footplates for increased challenge. Pt returned to room, and stand pivot w/c>bed with CGA, supine>sitting with supervision & hospital bed features. Pt left in bed with alarm set & all needs at  hand.  Therapy Documentation Precautions:  Precautions Precautions: Fall, Other (comment) Precaution Comments: L weakness Restrictions Weight Bearing Restrictions: No    Therapy/Group: Individual Therapy  Waunita Schooner 05/28/2019, 9:01 AM

## 2019-05-28 NOTE — Plan of Care (Signed)
  Problem: RH PAIN MANAGEMENT Goal: RH STG PAIN MANAGED AT OR BELOW PT'S PAIN GOAL Description: <3 on a 0-10 pain scale Outcome: Progressing   Problem: RH SAFETY Goal: RH STG ADHERE TO SAFETY PRECAUTIONS W/ASSISTANCE/DEVICE Description: STG Adhere to Safety Precautions With min Assistance and appropriate assistive Device. Outcome: Progressing

## 2019-05-29 ENCOUNTER — Inpatient Hospital Stay (HOSPITAL_COMMUNITY): Payer: BC Managed Care – PPO | Admitting: Physical Therapy

## 2019-05-29 ENCOUNTER — Inpatient Hospital Stay (HOSPITAL_COMMUNITY): Payer: BC Managed Care – PPO

## 2019-05-29 ENCOUNTER — Encounter (HOSPITAL_COMMUNITY): Payer: BC Managed Care – PPO | Admitting: Psychology

## 2019-05-29 MED ORDER — SACCHAROMYCES BOULARDII 250 MG PO CAPS
250.0000 mg | ORAL_CAPSULE | Freq: Two times a day (BID) | ORAL | Status: DC
  Administered 2019-05-29 – 2019-06-06 (×17): 250 mg via ORAL
  Filled 2019-05-29 (×17): qty 1

## 2019-05-29 MED ORDER — LOPERAMIDE HCL 2 MG PO CAPS
2.0000 mg | ORAL_CAPSULE | ORAL | Status: DC | PRN
  Administered 2019-06-01 – 2019-06-05 (×5): 2 mg via ORAL
  Filled 2019-05-29 (×5): qty 1

## 2019-05-29 NOTE — Progress Notes (Signed)
Carol Johnson PHYSICAL MEDICINE & REHABILITATION PROGRESS NOTE   Subjective/Complaints: C/o ongoing cold symptoms (admits they are improved). CXR negative yesterday. Concerned about recent stool recurrence, soft to loose   ROS: Patient denies fever, rash, sore throat, blurred vision, nausea, vomiting,   shortness of breath or chest pain, joint or back pain, headache, or mood change.      Objective:   Dg Chest 2 View  Result Date: 05/28/2019 CLINICAL DATA:  Cough, congestion EXAM: CHEST - 2 VIEW COMPARISON:  05/23/2019 FINDINGS: The heart size and mediastinal contours are within normal limits. Both lungs are clear. The visualized skeletal structures are unremarkable. IMPRESSION: No active cardiopulmonary disease. Electronically Signed   By: Davina Poke M.D.   On: 05/28/2019 12:34   No results for input(s): WBC, HGB, HCT, PLT in the last 72 hours. No results for input(s): NA, K, CL, CO2, GLUCOSE, BUN, CREATININE, CALCIUM in the last 72 hours.  Intake/Output Summary (Last 24 hours) at 05/29/2019 0905 Last data filed at 05/29/2019 0838 Gross per 24 hour  Intake 460 ml  Output -  Net 460 ml     Physical Exam: Vital Signs Blood pressure 114/67, pulse 66, temperature 97.9 F (36.6 C), temperature source Oral, resp. rate 18, height 5' 3"  (1.6 m), weight 105.1 kg, last menstrual period 11/29/2015, SpO2 96 %. Constitutional: No distress . Vital signs reviewed. HEENT: EOMI, oral membranes moist.does not sound congested Neck: supple Cardiovascular: RRR without murmur. No JVD    Respiratory: CTA Bilaterally without wheezes or rales. Normal effort    GI: BS +, non-tender, non-distended  Musculoskeletal:Normal range of motion.  General: No edema.  Neurological: She isalertand oriented to person, place, and time. Normal insight and awareness. Functional memory and language. Left central 7 and left facial sensory loss. RUE and RLE 5/5. LUE 4/5 prox to 3+/5 distally. LLE 3+ to 4/ HF,  KE, APF, ADF 3/5. DTR's remain 3+ left. Occasional catch left finger flexors/wrist/elbow. Sensory 1/2 LUE and LLE.--MAS of 1+in LUE- esp elbow and wrist and MAS of 1 in LLE.---able to move thru tone Skin: Skin iswarmand dry.  Psychiatric: appropriate    Assessment/Plan: 1. Functional deficits secondary to right  CR, PLIC infarct which require 3+ hours per day of interdisciplinary therapy in a comprehensive inpatient rehab setting.  Physiatrist is providing close team supervision and 24 hour management of active medical problems listed below.  Physiatrist and rehab team continue to assess barriers to discharge/monitor patient progress toward functional and medical goals  Care Tool:  Bathing  Bathing activity did not occur: Safety/medical concerns Body parts bathed by patient: Right arm, Left arm, Chest, Abdomen, Front perineal area, Buttocks, Right upper leg, Left upper leg, Right lower leg, Left lower leg, Face   Body parts bathed by helper: Buttocks, Right arm     Bathing assist Assist Level: Minimal Assistance - Patient > 75%     Upper Body Dressing/Undressing Upper body dressing Upper body dressing/undressing activity did not occur (including orthotics): N/A(patient already have gown on) What is the patient wearing?: Dress    Upper body assist Assist Level: Supervision/Verbal cueing    Lower Body Dressing/Undressing Lower body dressing    Lower body dressing activity did not occur: N/A What is the patient wearing?: Pants     Lower body assist Assist for lower body dressing: Minimal Assistance - Patient > 75%     Toileting Toileting    Toileting assist Assist for toileting: Supervision/Verbal cueing     Transfers Chair/bed  transfer  Transfers assist     Chair/bed transfer assist level: Supervision/Verbal cueing     Locomotion Ambulation   Ambulation assist      Assist level: Minimal Assistance - Patient > 75% Assistive device: Walker-rolling Max  distance: 50 ft   Walk 10 feet activity   Assist     Assist level: Minimal Assistance - Patient > 75% Assistive device: Walker-rolling   Walk 50 feet activity   Assist    Assist level: Minimal Assistance - Patient > 75% Assistive device: Walker-rolling    Walk 150 feet activity   Assist Walk 150 feet activity did not occur: Safety/medical concerns(decreased strength/activity tolerance)         Walk 10 feet on uneven surface  activity   Assist Walk 10 feet on uneven surfaces activity did not occur: Safety/medical concerns(decreased strength/activity tolerance)         Wheelchair     Assist   Type of Wheelchair: Manual    Wheelchair assist level: Set up assist, Supervision/Verbal cueing Max wheelchair distance: 105'    Wheelchair 50 feet with 2 turns activity    Assist        Assist Level: Set up assist, Supervision/Verbal cueing   Wheelchair 150 feet activity     Assist  Wheelchair 150 feet activity did not occur: Safety/medical concerns(decreased strength/activity tolerance)       Blood pressure 114/67, pulse 66, temperature 97.9 F (36.6 C), temperature source Oral, resp. rate 18, height 5' 3"  (1.6 m), weight 105.1 kg, last menstrual period 11/29/2015, SpO2 96 %.  Medical Problem List and Plan: 1.Left-sided weaknessand hemi-sensory losssecondary toright posterior corona radiata/PL IC infarction likely secondary small vessel disease. Plan 30-day event monitor as outpatient -continue CIR therapies today including PT and OT, team conf today -sl flexor tone in LUE   9/28- con't Robaxin prn 2. Antithrombotics: -DVT/anticoagulation:Lovenox -antiplatelet therapy: Aspirin 81 mg daily, Plavix 75 mg daily x3 weeks then aspirin alone 3. Pain Management:Tylenol as needed  -added robaxin prn for spasms 4. Mood:Celexa 20 mg daily, Wellbutrin 300 mg daily -antipsychotic agents:  N/A  -ambien for sleep 5. Neuropsych: This patientiscapable of making decisions on herown behalf. 6. Skin/Wound Care:Routine skin checks 7. Fluids/Electrolytes/Nutrition:Routine in and outs with follow-up chemistries 8. Ulcerative colitis.Lialda2.4 mg twice daily -moved bowels twice over last 12 hours, pt concerned about increase  -add probiotic  -prn imodium 9. Hyperlipidemia. Lipitor 10. History of asthma and remote tobacco abuse. Albuterol nebulizer as needed.  -added nasonex and claritin for congestion   -continue robitussin for cough   -still with sx but improving   -lungs clear  9/28- CXR (-)      LOS: 4 days A FACE TO FACE EVALUATION WAS PERFORMED  Carol Johnson 05/29/2019, 9:05 AM

## 2019-05-29 NOTE — Progress Notes (Signed)
Physical Therapy Session Note  Patient Details  Name: Carol Johnson MRN: [id number] Date of Birth: September 22, 1965  Today's Date: 05/29/2019 PT Individual Time: 1002-1048 PT Individual Time Calculation (min): 46 min   Short Term Goals: Week 1:  PT Short Term Goal 1 (Week 1): Patient will perform transfer with CGA. PT Short Term Goal 2 (Week 1): Patient will ambulate >100 feet with CGA using LRAD. PT Short Term Goal 3 (Week 1): Patient will perform dynamic standing balance with CGA without UE support.  Skilled Therapeutic Interventions/Progress Updates:   Bed mobility with supervision to come to EOB with set up assist for donning of shoes. Pt slightly impulsive with transfers throughout session noted decreased safety awareness. Pt initially with difficulty with sit <> stand from EOB requiring several attempts and min assist and then min assist to perform safe transfer to w/c due to decreased awareness of positioning of LLE. Seated LLE stretching including propped on support for hamstrings and heel cord stretches with towel x 3 reps each x 30-40 sec hold. NMR for dynamic standing balance, functional use and coordination of LUE, and overall postural control re-training to reach across midline with LUE and place horseshoe overhead x 2 trials with min assist overall for balance. Gait training with RW with focus on coordination of LLE during stepping (ataxia noted) and cues for safe positioning of RW with min assist x 70'. Pt with decreased overall safety awareness during mobility especially during turns.   Therapy Documentation Precautions:  Precautions Precautions: Fall, Other (comment) Precaution Comments: L weakness Restrictions Weight Bearing Restrictions: No    Pain: Pt reports tightness in her LUE and LLE. Performed stretching exercises during session and RN was notified for patient request for muscle relaxer.       Therapy/Group: Individual Therapy  Canary Brim Ivory Broad, PT, DPT, CBIS  05/29/2019, 11:14 AM

## 2019-05-29 NOTE — Progress Notes (Signed)
Occupational Therapy Session Note  Patient Details  Name: Carol Johnson MRN: [id number] Date of Birth: 05/16/1966  Today's Date: 05/29/2019 OT Individual Time: 1130-1200 and 1400-1500 OT Individual Time Calculation (min): 30 min and 60 mins   Short Term Goals: Week 1:  OT Short Term Goal 1 (Week 1): Pt will complete LB bathing sit to stand with min guard assist. OT Short Term Goal 2 (Week 1): Pt will use the LUE as an active assist for bathing tasks with no more than mod assist. OT Short Term Goal 3 (Week 1): Pt will complete toilet transfer with min guard assist using the RW for support. OT Short Term Goal 4 (Week 1): Pt will complete LB dressing with min assist sit to stand.     Skilled Therapeutic Interventions/Progress Updates:   Session 1: Pt received sitting in w/c with no c/o pain today. Pt performed oral care while seated at sink with (S) and demostrated use of L UE to grasp and squeeze toothpaste. Pt reports needing to urinate; ambulated with RW and min A to toilet and voided urine. Pt donned maxi dress with (S) and required min A for sit <>stand from w/c to pull down the dress. Pt completed arm exercises while seated in front of mirror to address movements of L shoulder and scapular protraction/retraction and elevation/depression. Input given on scapula and under shoulder for NMR. Pt educated on risks of shoulder sublaxation and Abduction of elbow when completing functional UE tasks.  Pt completed both exercises with 10 reps each and two trials. End of session pt left in w/c with safety belt on, call bell within reach and all needs met.   Session 2: Pt received sitting in w/c and complains of pain/soreness in L UE. Pt does not rate pain and reports she has requested tylenol from nurse. Pt ambulated to shower with RW and min A for balance. Pt is (S) for UB bathing and demonstrates functional use of L hand and UE to wash R side of body. Pt is min A for LB bathing for balance when standing  to wash buttocks. Pt completed LB bathing with use of long handled sponge. During shower, pt reporting she has clear/slightly yellow leakage from L breast and has tenderness in R breast. OT encouraged pt to let MD know this issue and OT added to pt sticky note. Pt completed UB and LB dressing with CGA. Pt completes hair styling at sink level while seated and uses L UE to help style hair. Pt transported to dayroom and completed fine motor tasks to address coordination of digits and AB/ADuction of L thumb to increase use of L hand in ADL tasks. Pt completed fine motor reaching task while standing with table for support with CGA; pt able to manipulate small items in hand demonstrating finger to palm translation and bring the items across midline to place them in designated area. Pt reporting soreness in L UE and bicep area, OT adminstered hot pack. Exited session with pt in w/c with safety belt on and all needs met.  Therapy Documentation Precautions:  Precautions Precautions: Fall, Other (comment) Precaution Comments: L weakness Restrictions Weight Bearing Restrictions: No    Pain: Pain Assessment Pain Scale: 0-10 Pain Score: 3    Therapy/Group: Individual Therapy  Frannie Shedrick 05/29/2019, 12:54 PM

## 2019-05-29 NOTE — Progress Notes (Signed)
Chaplain followed up on a prior chaplain's attempt to complete Advanced Directives for Carol Johnson. Emillie was in a PT session. Hazyl said her sister is scheduled to visit today. Azari does not understand what an advanced directive is. Yasemin said she is not interested at this time, and would have the nurse page chaplain services if she changes her mind. Chaplain remains available per request.  Chaplain Resident, Evelene Croon, [place]. Pager # [phone] personal Pager # [phone] On-call

## 2019-05-29 NOTE — Progress Notes (Signed)
Physical Therapy Session Note  Patient Details  Name: Carol Johnson MRN: [id number] Date of Birth: [birth year]  Today's Date: 05/29/2019 PT Individual Time: 0807-0902 PT Individual Time Calculation (min): 55 min   Short Term Goals: Week 1:  PT Short Term Goal 1 (Week 1): Patient will perform transfer with CGA. PT Short Term Goal 2 (Week 1): Patient will ambulate >100 feet with CGA using LRAD. PT Short Term Goal 3 (Week 1): Patient will perform dynamic standing balance with CGA without UE support.  Skilled Therapeutic Interventions/Progress Updates:  Pt received in w/c reporting urgent need to have BM so assisted pt into bathroom via w/c. Pt transfers w/c<>toilet with use of grab bar & supervision. Pt with continent BM on toilet, performing peri hygiene without assistance. Pt performs hand hygiene at sink with set up assist from w/c level. Transported pt to gym via w/c dependent assist for time management. Pt transfers to [facility] with close supervision. Pt completes Berg Balance Test & scores 27/56; educated pt on interpretation of score & current fall risk. Patient demonstrates increased fall risk as noted by score of 27/56 on Berg Balance Scale.  (<36= high risk for falls, close to 100%; 37-45 significant >80%; 46-51 moderate >50%; 52-55 lower >25%). Pt performed standing step taps (3" step) with min assist with task focusing on weight shifting L<>R, BLE single leg stance, dynamic balance, LLE coordination & NMR, and LLE hip flexor strengthening with rest breaks PRN. Pt reports she feels as though LLE will buckle but none noted during activity. At end of session pt returned to room, stand pivot w/c>bed and sit>supine with supervision. Pt left in bed with alarm set & all needs in reach.  Pain: No c/o pain reported but pt c/o L toes curling/locking up during activity.   Therapy Documentation Precautions:  Precautions Precautions: Fall, Other (comment) Precaution Comments: L  weakness Restrictions Weight Bearing Restrictions: No  Balance: Balance Balance Assessed: Yes Standardized Balance Assessment Standardized Balance Assessment: Berg Balance Test Berg Balance Test Sit to Stand: Needs minimal aid to stand or to stabilize(stabilizes with BLE against EOM) Standing Unsupported: Able to stand 2 minutes with supervision Sitting with Back Unsupported but Feet Supported on Floor or Stool: Able to sit safely and securely 2 minutes Stand to Sit: Sits safely with minimal use of hands Transfers: Able to transfer with verbal cueing and /or supervision Standing Unsupported with Eyes Closed: Able to stand 10 seconds with supervision Standing Ubsupported with Feet Together: Needs help to attain position and unable to hold for 15 seconds(LOB when attempting to attain position) From Standing, Reach Forward with Outstretched Arm: Reaches forward but needs supervision From Standing Position, Pick up Object from Floor: Unable to try/needs assist to keep balance From Standing Position, Turn to Look Behind Over each Shoulder: Looks behind from both sides and weight shifts well Turn 360 Degrees: Needs close supervision or verbal cueing Standing Unsupported, Alternately Place Feet on Step/Stool: Needs assistance to keep from falling or unable to try Standing Unsupported, One Foot in Front: Able to take small step independently and hold 30 seconds Standing on One Leg: Able to lift leg independently and hold equal to or more than 3 seconds(pt elects to lift LLE, stand on RLE) Total Score: 27    Therapy/Group: Individual Therapy  Waunita Schooner 05/29/2019, 9:05 AM

## 2019-05-30 ENCOUNTER — Inpatient Hospital Stay (HOSPITAL_COMMUNITY): Payer: BC Managed Care – PPO | Admitting: Physical Therapy

## 2019-05-30 ENCOUNTER — Inpatient Hospital Stay (HOSPITAL_COMMUNITY): Payer: BC Managed Care – PPO

## 2019-05-30 NOTE — Progress Notes (Signed)
Occupational Therapy Session Note  Patient Details  Name: Carol Johnson MRN: [id number] Date of Birth: Apr 15, 1966  Today's Date: 05/30/2019 OT Individual Time: 1415-1540 OT Individual Time Calculation (min): 85 min    Short Term Goals: Week 1:  OT Short Term Goal 1 (Week 1): Pt will complete LB bathing sit to stand with min guard assist. OT Short Term Goal 2 (Week 1): Pt will use the LUE as an active assist for bathing tasks with no more than mod assist. OT Short Term Goal 3 (Week 1): Pt will complete toilet transfer with min guard assist using the RW for support. OT Short Term Goal 4 (Week 1): Pt will complete LB dressing with min assist sit to stand.  Skilled Therapeutic Interventions/Progress Updates:    Pt received sitting up in the w/c with no c/o pain but c/o fatigue in the L UE from previous exercises. Pt was transported outside for increased mood. Extensive discussion re d/c home and potential vacation pt is planning right at d/c. Discussed home set up of vacation home, bathroom set up, transport of equipment, and safety considerations. Pt completed work Biochemist, clinical, practicing typing on a laptop. 75% accuracy with slow pace overall. Presented potential ADA compliant adaptations work could set up for pt and modified keyboards for use. Pt was transported back inside where she completed blocked practice of stair training. Pt completed 4 steps with 1 L rail and 1 step with no rail. Min A for 4 steps with railing and mod A for 1 step without a rail. Cueing required for hemi technique. Pt returned to room and discussed family edu session with son for navigating stairs. Pt was given coloring pencils and coloring sheets for increased [place] and accuracy with the LUE. Pt left sitting up. Discussed with RN re chair alarm discontinuation and updated safety plan.   Therapy Documentation Precautions:  Precautions Precautions: Fall, Other (comment) Precaution Comments: L  weakness Restrictions Weight Bearing Restrictions: No   Therapy/Group: Individual Therapy  Curtis Sites 05/30/2019, 7:13 AM

## 2019-05-30 NOTE — Progress Notes (Signed)
Occupational Therapy Session Note  Patient Details  Name: Carol Johnson MRN: [id number] Date of Birth: 01-05-1966  Today's Date: 05/30/2019 OT Individual Time: 3668-1594 OT Individual Time Calculation (min): 60 min    Short Term Goals: Week 1:  OT Short Term Goal 1 (Week 1): Pt will complete LB bathing sit to stand with min guard assist. OT Short Term Goal 2 (Week 1): Pt will use the LUE as an active assist for bathing tasks with no more than mod assist. OT Short Term Goal 3 (Week 1): Pt will complete toilet transfer with min guard assist using the RW for support. OT Short Term Goal 4 (Week 1): Pt will complete LB dressing with min assist sit to stand.     Skilled Therapeutic Interventions/Progress Updates:    Pt received sitting in w/c, no c/o pain and ready for OT tx. Pt walks to therapy gym with RW and CGA requires min cues for L heel strike and proper foot placement when ambulating. Pt completes laundry folding task while standing with CGA to address functional use of L UE and hand for IADL tasks. Pt able to engage L hand and demonstrate crossing midline with L hand to place laundry on R side. Pt stands on compliant foam wedge with RW in front for support and min A-CGA and completed throwing and catching ball to challenge dynamic and static standing balance for carryover to functional task and uneven surfaces in home and community. Pt completed exercises for L UE strength and decreasing compensatory strategies. Provided tactile input at elbow and scapula for NMR. Pt completed 3x/10 scapular protraction/retraction,tricep extension/flexion, and diagonal reaching on L and R side. Pt practices in therapy apartment simulation of walk in shower enter and exit in preparation for d/c to sisters home. OT encouraged pt to keep walker in close proximity while stepping into shower and maintaining a wide BOS when stepping over shower threshold to increase balance. End of session pt left in w/c with safety  belt on and all needs met.   Therapy Documentation Precautions:  Precautions Precautions: Fall, Other (comment) Precaution Comments: L weakness Restrictions Weight Bearing Restrictions: No    Pain: Pain Assessment Pain Scale: 0-10 Pain Score: 2       Therapy/Group: Individual Therapy  Sreekar Broyhill 05/30/2019, 12:16 PM

## 2019-05-30 NOTE — Progress Notes (Signed)
Physical Therapy Session Note  Patient Details  Name: Carol Johnson MRN: [id number] Date of Birth: 11/22/1965  Today's Date: 05/30/2019 PT Individual Time: 0824-0902 PT Individual Time Calculation (min): 38 min   Short Term Goals: Week 1:  PT Short Term Goal 1 (Week 1): Patient will perform transfer with CGA. PT Short Term Goal 2 (Week 1): Patient will ambulate >100 feet with CGA using LRAD. PT Short Term Goal 3 (Week 1): Patient will perform dynamic standing balance with CGA without UE support.  Skilled Therapeutic Interventions/Progress Updates:  Pt received sitting on EOB, eating breakfast & requesting to finish as she recently woke up for the day. Therapist returned later & pt agreeable to tx, but reporting need to use restroom & change clothes. Pt transfers sit>stand from EOB with CGA and ambulates into bathroom with RW & min assist with ongoing ataxia noted in LLE. Pt completes toilet transfer with CGA, managing clothing without assistance, with continent void & BM on toilet & performing peri hygiene without assistance. Pt performed grooming tasks (wash hands, brush hair, brush teeth) standing at sink with CGA>supervision assist for standing balance but assistance to brush hair in the back. Pt ambulates in room with CGA/min assist and RW to retreive clothing from [employer] and with instruction to sit EOB pt doffs current gown & dons bra & new dress with extra time but no assistance. Pt transferred to w/c & therapist transported pt to/from gym via w/c dependent assist for time management. Therapist provides demo for 1 step negotiation with RW with pt now reporting she actually has 2 STE without rails at her sister's house. Unable to demo or practice stairs in this manner 2/2 time constraints. Pt returned to room & left sitting in w/c with chair alarm donned & all needs in reach.  Therapy Documentation Precautions:  Precautions Precautions: Fall, Other (comment) Precaution Comments: L  weakness Restrictions Weight Bearing Restrictions: No   General: PT Amount of Missed Time (min): 24 Minutes PT Missed Treatment Reason: (breakfast)    Therapy/Group: Individual Therapy  Waunita Schooner 05/30/2019, 9:40 AM

## 2019-05-30 NOTE — Progress Notes (Signed)
[facility] [place] [facility] PHYSICAL MEDICINE & REHABILITATION PROGRESS NOTE   Subjective/Complaints: Cold symptoms better. Had questions about left breast which apparently has been draining. She also has noticed a lump. Was planning to see her GYN. Never made appt  ROS: Patient denies fever, rash, sore throat, blurred vision, nausea, vomiting, diarrhea, cough, shortness of breath or chest pain, joint or back pain, headache, or mood change.    Objective:   Dg Chest 2 View  Result Date: 05/28/2019 CLINICAL DATA:  Cough, congestion EXAM: CHEST - 2 VIEW COMPARISON:  05/23/2019 FINDINGS: The heart size and mediastinal contours are within normal limits. Both lungs are clear. The visualized skeletal structures are unremarkable. IMPRESSION: No active cardiopulmonary disease. Electronically Signed   By: Davina Poke M.D.   On: 05/28/2019 12:34   No results for input(s): WBC, HGB, HCT, PLT in the last 72 hours. No results for input(s): NA, K, CL, CO2, GLUCOSE, BUN, CREATININE, CALCIUM in the last 72 hours.  Intake/Output Summary (Last 24 hours) at 05/30/2019 0826 Last data filed at 05/29/2019 1900 Gross per 24 hour  Intake 720 ml  Output -  Net 720 ml     Physical Exam: Vital Signs Blood pressure 124/70, pulse 72, temperature 98 F (36.7 C), temperature source Oral, resp. rate 16, height 5' 3"  (1.6 m), weight 104.2 kg, last menstrual period 11/29/2015, SpO2 97 %. Constitutional: No distress . Vital signs reviewed. HEENT: EOMI, oral membranes moist Neck: supple Cardiovascular: RRR without murmur. No JVD    Respiratory: CTA Bilaterally without wheezes or rales. Normal effort    GI: BS +, non-tender, non-distended  GYN: ?small lump just below nipple left breast. Minimal tenderness Musculoskeletal:Normal range of motion.  General: No edema.  Neurological: She isalertand oriented to person, place, and time. Normal insight and awareness. Functional memory and language. Left central 7 and left  facial sensory loss. RUE and RLE 5/5. LUE 4/5 prox to 3+/5 distally. LLE 3+ to 4/ HF, KE, APF, ADF 3/5. DTR's remain 3+ left. Occasional catch left finger flexors/wrist/elbow--really improving . Sensory 1/2 LUE and LLE.--trace tone. ?apraxia   Skin: Skin iswarmand dry.  Psychiatric: pleasant    Assessment/Plan: 1. Functional deficits secondary to right  CR, PLIC infarct which require 3+ hours per day of interdisciplinary therapy in a comprehensive inpatient rehab setting.  Physiatrist is providing close team supervision and 24 hour management of active medical problems listed below.  Physiatrist and rehab team continue to assess barriers to discharge/monitor patient progress toward functional and medical goals  Care Tool:  Bathing  Bathing activity did not occur: Safety/medical concerns Body parts bathed by patient: Right arm, Left arm, Chest, Abdomen, Front perineal area, Right upper leg, Buttocks, Left upper leg, Right lower leg, Left lower leg, Face   Body parts bathed by helper: Buttocks, Right arm     Bathing assist Assist Level: Minimal Assistance - Patient > 75%(For balance while standing to wash buttocks)     Upper Body Dressing/Undressing Upper body dressing Upper body dressing/undressing activity did not occur (including orthotics): N/A(patient already have gown on) What is the patient wearing?: Dress    Upper body assist Assist Level: Supervision/Verbal cueing    Lower Body Dressing/Undressing Lower body dressing    Lower body dressing activity did not occur: N/A What is the patient wearing?: Pants     Lower body assist Assist for lower body dressing: Minimal Assistance - Patient > 75%     Toileting Toileting    Toileting assist Assist for toileting:  Supervision/Verbal cueing     Transfers Chair/bed transfer  Transfers assist     Chair/bed transfer assist level: Minimal Assistance - Patient > 75%     Locomotion Ambulation   Ambulation  assist      Assist level: Minimal Assistance - Patient > 75% Assistive device: Walker-rolling Max distance: 70'   Walk 10 feet activity   Assist     Assist level: Minimal Assistance - Patient > 75% Assistive device: Walker-rolling   Walk 50 feet activity   Assist    Assist level: Minimal Assistance - Patient > 75% Assistive device: Walker-rolling    Walk 150 feet activity   Assist Walk 150 feet activity did not occur: Safety/medical concerns(decreased strength/activity tolerance)         Walk 10 feet on uneven surface  activity   Assist Walk 10 feet on uneven surfaces activity did not occur: Safety/medical concerns(decreased strength/activity tolerance)         Wheelchair     Assist   Type of Wheelchair: Manual    Wheelchair assist level: Set up assist, Supervision/Verbal cueing Max wheelchair distance: 105'    Wheelchair 50 feet with 2 turns activity    Assist        Assist Level: Set up assist, Supervision/Verbal cueing   Wheelchair 150 feet activity     Assist  Wheelchair 150 feet activity did not occur: Safety/medical concerns(decreased strength/activity tolerance)       Blood pressure 124/70, pulse 72, temperature 98 F (36.7 C), temperature source Oral, resp. rate 16, height 5' 3"  (1.6 m), weight 104.2 kg, last menstrual period 11/29/2015, SpO2 97 %.  Medical Problem List and Plan: 1.Left-sided weaknessand hemi-sensory losssecondary toright posterior corona radiata/PL IC infarction likely secondary small vessel disease. Plan 30-day event monitor as outpatient -continue CIR therapies today including PT and OT  -sl flexor tone in LUE     2. Antithrombotics: -DVT/anticoagulation:Lovenox -antiplatelet therapy: Aspirin 81 mg daily, Plavix 75 mg daily x3 weeks then aspirin alone 3. Pain Management:Tylenol as needed  -continue robaxin prn for spasms   4. Mood:Celexa 20 mg daily,  Wellbutrin 300 mg daily -antipsychotic agents: N/A  -ambien for sleep 5. Neuropsych: This patientiscapable of making decisions on herown behalf. 6. Skin/Wound Care:Routine skin checks 7. Fluids/Electrolytes/Nutrition:Routine in and outs with follow-up chemistries 8. Ulcerative colitis.Lialda2.4 mg twice daily -moved bowels twice early yesterday. None since  -continue probiotic  -prn imodium 9. Hyperlipidemia. Lipitor 10. History of asthma and remote tobacco abuse. Albuterol nebulizer as needed.  -added nasonex and claritin for congestion   -continue robitussin for cough   -sx improving  9/28- CXR (-)   11. ?Breast lump left, drainage  -will inquire about availability of mammogram    LOS: 5 days A FACE TO FACE EVALUATION WAS PERFORMED  Meredith Staggers 05/30/2019, 8:26 AM

## 2019-05-31 ENCOUNTER — Inpatient Hospital Stay (HOSPITAL_COMMUNITY): Payer: BC Managed Care – PPO

## 2019-05-31 ENCOUNTER — Inpatient Hospital Stay (HOSPITAL_COMMUNITY): Payer: BC Managed Care – PPO | Admitting: Physical Therapy

## 2019-05-31 NOTE — Evaluation (Addendum)
Recreational Therapy Assessment and Plan  Patient Details  Name: Carol Johnson MRN: [id number] Date of Birth: 11-06-1965 Today's Date: 05/31/2019  Rehab Potential: Good ELOS: d/c 10/7   Assessment  Problem List:      Patient Active Problem List   Diagnosis Date Noted  . Small vessel cerebrovascular accident (CVA) ([place]) 05/25/2019  . CVA (cerebral vascular accident) ([place]) 05/23/2019  . Thrombocytosis ([place]) 05/23/2019  . Coronary artery disease involving native coronary artery of native heart without angina pectoris 03/21/2018  . Coronary artery calcification 02/10/2018  . Morbid obesity ([place]) 02/10/2018  . Smoker 01/15/2018  . Dyspnea on exertion 12/21/2017  . Carpal tunnel syndrome 12/21/2017  . Stress incontinence 12/21/2017  . Paresthesia of both feet 07/04/2017  . Always thirsty 07/04/2017  . Abdominal bloating 12/01/2015  . Fatigue 12/01/2015  . Vitamin B12 deficiency 12/01/2015  . Lymphocytic colitis 01/16/2015  . Anemia, iron deficiency 01/16/2015  . Anxiety state 12/20/2014  . Diarrhea 11/27/2014  . Insomnia 11/27/2014  . History of DVT (deep vein thrombosis) 09/17/2014  . OSA (obstructive sleep apnea) 09/02/2014  . Gastritis, chronic 06/06/2014  . Unspecified gastritis and gastroduodenitis without mention of hemorrhage 05/03/2014  . Abdominal pain, epigastric 04/19/2014  . Internal hemorrhoids with other complication 32/20/2542  . Routine general medical examination at a health care facility 10/14/2011  . Thyroid nodule 05/16/2011  . Rectal bleeding 11/24/2010  . Hemorrhoids, internal 11/24/2010  . Irritable bowel syndrome (IBS) 11/24/2010  . ANEMIA 10/02/2009  . Hyperlipidemia 07/18/2009  . Essential hypertension 07/18/2009  . DYSPNEA ON EXERTION 07/18/2009  . THYROID CYST 01/31/2008  . ANXIETY 01/31/2008  . Depression with anxiety 01/31/2008  . GASTRIC ULCER, HX OF 01/31/2008  . FIBROIDS, UTERUS 11/13/2007  . ALLERGIC RHINITIS 11/13/2007  . ASTHMA  11/13/2007  . GERD 11/13/2007  . IRRITABLE BOWEL SYNDROME 11/13/2007  . INTERNAL HEMORRHOIDS 11/12/2003    Past Medical History:      Past Medical History:  Diagnosis Date  . Abdominal pain, unspecified site   . Allergic rhinitis   . Anxiety   . Asthma   . Depression   . DVT (deep venous thrombosis) ([place]) 2016  . Fibroid uterus   . GERD (gastroesophageal reflux disease)   . Hypercholesterolemia   . Hypertension   . Internal hemorrhoid   . Irritable bowel syndrome   . Lymphocytic colitis 01/02/2015  . Pneumonia 1998  . Stroke ([place])   . Ulcerative colitis ([place])   . URI (upper respiratory infection)   . Uterine fibroid   . Vitamin B12 deficiency    Past Surgical History:       Past Surgical History:  Procedure Laterality Date  . CESAREAN SECTION  2004  . CHOLECYSTECTOMY     laparoscopic "gallstones"  . ESOPHAGOGASTRODUODENOSCOPY (EGD) WITH PROPOFOL N/A 05/03/2014   Procedure: ESOPHAGOGASTRODUODENOSCOPY (EGD) WITH PROPOFOL;  Surgeon: Inda Castle, MD;  Location: [facility] ENDOSCOPY;  Service: Endoscopy;  Laterality: N/A;  . MYOMECTOMY    . UTERINE FIBROID SURGERY      Assessment & Plan Clinical Impression: Patient is a 53 y.o. year old right-handed female history of hypertension, ulcerative colitis, B12 deficiency, DVT 2016 left posterior tibial vein contributed to estrogen use and smoking she was on Xarelto and has been since completed, hyperlipidemia, asthma, and quit smoking 17 months ago.. Per chart review patient independent prior to admission to level home with bed and bath upstairs 6 steps to entry. She lives with family in good support. Presented 05/23/2019 with left-sided  weakness. Cranial CT scan negative. MRI showed a 2 cm acute infarct right corona radiata internal capsule extending into the right basal ganglia. MRA head with no LVO but concerning for possible 2 mm aneurysm from right A2 division of the anterior cerebral artery. MRA of  neck unremarkable for any acute occlusion. Echocardiogram with ejection fraction of 65%. Neurology follow-up placed on aspirin and Plavix x3 weeks then aspirin alone. Subcutaneous Lovenox for DVT prophylaxis. Recommendations for 30-day cardiac event monitor as outpatient. Tolerating a regular consistency diet. Therapy evaluations completed and patient was admitted for a comprehensive rehab program Patient transferred to CIR on 05/25/2019.   Pt presents with decreased activity tolerance, decreased functional mobility, decreased balance, decreased coordination Limiting pt's independence with leisure/community pursuits.   Leisure History/Participation Premorbid leisure interest/current participation: Crafts - Painting;Community - Doctor, hospital - Grocery store;Crafts - Other (Comment)(wreath making) Leisure Participation Style: Alone;With Family/Friends Awareness of Community Resources: Good-identify 3 post discharge leisure resources Psychosocial / Spiritual Stress Management: Good Methods of Stress Management: crafting Social interaction - Mood/Behavior: Cooperative Academic librarian Appropriate for Education?: Yes Strengths/Weaknesses Patient Strengths/Abilities: Willingness to participate;Active premorbidly Patient weaknesses: Physical limitations TR Patient demonstrates impairments in the following area(s): Endurance;Motor  Plan Rec Therapy Plan Rehab Potential: Good Treatment times per week: Min 1 TR session >20 minutes during LOS Estimated Length of Stay: d/c 10/7 TR Treatment/Interventions: Adaptive equipment instruction;Community reintegration;Patient/family education;1:1 session;Functional mobility training;Balance/vestibular training;Recreation/leisure participation;Therapeutic activities  Recommendations for other services: None   Discharge Criteria: Patient will be discharged from TR if patient refuses treatment 3 consecutive times without medical reason.   If treatment goals not met, if there is a change in medical status, if patient makes no progress towards goals or if patient is discharged from hospital.  The above assessment, treatment plan, treatment alternatives and goals were discussed and mutually agreed upon: by patient   *06/01/2019  Eval deferred due to discharge of 10/7 and LRT not returning to work until that date.  [place] 05/31/2019, 3:22 PM

## 2019-05-31 NOTE — Patient Care Conference (Signed)
Inpatient RehabilitationTeam Conference and Plan of Care Update Date: 05/29/2019   Time: 10:20 AM    Patient Name: Carol Johnson      Medical Record Number: [id number]  Date of Birth: Sep 13, 1965 Sex: Female         Room/Bed: 4W03C/4W03C-01 Payor Info: Payor: [place] / Plan: [employer] COMM PPO / Product Type: *No Product type* /    Admit Date/Time:  05/25/2019  6:58 PM  Primary Diagnosis:  Small vessel cerebrovascular accident (CVA) [facility])  Patient Active Problem List   Diagnosis Date Noted  . Small vessel cerebrovascular accident (CVA) ([place]) 05/25/2019  . CVA (cerebral vascular accident) ([place]) 05/23/2019  . Thrombocytosis ([place]) 05/23/2019  . Coronary artery disease involving native coronary artery of native heart without angina pectoris 03/21/2018  . Coronary artery calcification 02/10/2018  . Morbid obesity ([place]) 02/10/2018  . Smoker 01/15/2018  . Dyspnea on exertion 12/21/2017  . Carpal tunnel syndrome 12/21/2017  . Stress incontinence 12/21/2017  . Paresthesia of both feet 07/04/2017  . Always thirsty 07/04/2017  . Abdominal bloating 12/01/2015  . Fatigue 12/01/2015  . Vitamin B12 deficiency 12/01/2015  . Lymphocytic colitis 01/16/2015  . Anemia, iron deficiency 01/16/2015  . Anxiety state 12/20/2014  . Diarrhea 11/27/2014  . Insomnia 11/27/2014  . History of DVT (deep vein thrombosis) 09/17/2014  . OSA (obstructive sleep apnea) 09/02/2014  . Gastritis, chronic 06/06/2014  . Unspecified gastritis and gastroduodenitis without mention of hemorrhage 05/03/2014  . Abdominal pain, epigastric 04/19/2014  . Internal hemorrhoids with other complication 37/94/3276  . Routine general medical examination at a health care facility 10/14/2011  . Thyroid nodule 05/16/2011  . Rectal bleeding 11/24/2010  . Hemorrhoids, internal 11/24/2010  . Irritable bowel syndrome (IBS) 11/24/2010  . ANEMIA 10/02/2009  . Hyperlipidemia 07/18/2009  . Essential hypertension 07/18/2009  .  DYSPNEA ON EXERTION 07/18/2009  . THYROID CYST 01/31/2008  . ANXIETY 01/31/2008  . Depression with anxiety 01/31/2008  . GASTRIC ULCER, HX OF 01/31/2008  . FIBROIDS, UTERUS 11/13/2007  . ALLERGIC RHINITIS 11/13/2007  . ASTHMA 11/13/2007  . GERD 11/13/2007  . IRRITABLE BOWEL SYNDROME 11/13/2007  . INTERNAL HEMORRHOIDS 11/12/2003    Expected Discharge Date: Expected Discharge Date: 06/09/19  Team Members Present: Physician leading conference: Dr. Alger Simons Social Worker Present: Lennart Pall, LCSW Nurse Present: Rayetta Humphrey, RN PT Present: Lavone Nian, PT OT Present: Mariane Masters, OT SLP Present: Weston Anna, SLP PPS Coordinator present : Ileana Ladd, PT     Current Status/Progress Goal Weekly Team Focus  Bowel/Bladder             Swallow/Nutrition/ Hydration             ADL's   min A transfers and ADLs overall. LUE at a brummstrom IV  Supervision ADLs and transfers  LUE NMR, ADL transfers, Hemi techniques during bathing/dressing   Mobility   min assist transfers & gait with RW, supervision for bed mobility with hospital bed features  supervision overall with LRAD  L NMR, strengthening, balance, gait, stair negotiation, transfers, bed mobility, d/c planning   Communication             Safety/Cognition/ Behavioral Observations            Pain             Skin              Rehab Goals Patient on target to meet rehab goals: Yes *See Care Plan and progress notes  for long and short-term goals.     Barriers to Discharge  Current Status/Progress Possible Resolutions Date Resolved   Nursing                  PT  Home environment access/layout  pt with multilevel home but now planning to d/c to sister's house who has a multilevel but all needed rooms on main level with level entry to house              OT                  SLP                SW                Discharge Planning/Teaching Needs:  Pt to d/c to sister's home and her 60 yr old son  there as well.  Teaching still to be planned.   Team Discussion:  Tone present but pt working through this.  Cont b/b.  Min A overall with PT/OT and amb short distances.  Planning for supervision goals.  Review of DME needs and recommending OP tx.  Revisions to Treatment Plan:  NA    Medical Summary Current Status: right BG/IC infarct with left hemiparesis and hemisensory deficits. cold, UC symptoms, bp controlled Weekly Focus/Goal: improve functional use of left side  Barriers to Discharge: Medical stability     Team conference was held via web/ teleconference due to [place] - 19   Continued Need for Acute Rehabilitation Level of Care: The patient requires daily medical management by a physician with specialized training in physical medicine and rehabilitation for the following reasons: Direction of a multidisciplinary physical rehabilitation program to maximize functional independence : Yes Medical management of patient stability for increased activity during participation in an intensive rehabilitation regime.: Yes Analysis of laboratory values and/or radiology reports with any subsequent need for medication adjustment and/or medical intervention. : Yes   I attest that I was present, lead the team conference, and concur with the assessment and plan of the team.   Neida Ellegood 06/01/2019, 8:12 AM

## 2019-05-31 NOTE — Progress Notes (Signed)
[place] PHYSICAL MEDICINE & REHABILITATION PROGRESS NOTE   Subjective/Complaints: Overall feeling well. Still has a catch in left arm. Denies pain  ROS: Patient denies fever, rash, sore throat, blurred vision, nausea, vomiting, diarrhea, cough, shortness of breath or chest pain, joint or back pain, headache, or mood change.    Objective:   No results found. No results for input(s): WBC, HGB, HCT, PLT in the last 72 hours. No results for input(s): NA, K, CL, CO2, GLUCOSE, BUN, CREATININE, CALCIUM in the last 72 hours.  Intake/Output Summary (Last 24 hours) at 05/31/2019 1008 Last data filed at 05/31/2019 0900 Gross per 24 hour  Intake 480 ml  Output -  Net 480 ml     Physical Exam: Vital Signs Blood pressure 122/76, pulse 80, temperature 97.7 F (36.5 C), temperature source Oral, resp. rate 16, height 5' 3"  (1.6 m), weight 104.7 kg, last menstrual period 11/29/2015, SpO2 95 %. Constitutional: No distress . Vital signs reviewed. HEENT: EOMI, oral membranes moist Neck: supple Cardiovascular: RRR without murmur. No JVD    Respiratory: CTA Bilaterally without wheezes or rales. Normal effort    GI: BS +, non-tender, non-distended  GYN: small lump just below nipple left breast. Minimally tender Musculoskeletal:Normal range of motion.  General: No edema.  Neurological: She isalertand oriented to person, place, and time. Normal insight and awareness. Functional memory and language. Left central 7 and left facial sensory loss. RUE and RLE 5/5. LUE 4/5 prox to 3+/5 distally. LLE 3+ to 4/ HF, KE, APF, ADF 3/5. DTR's remain 3+ left. Occasional catch left finger flexors/wrist/elbow--patient is easily able to power through it.  Sensory 1/2 LUE and LLE.  ? Motor apraxia   Skin: Skin iswarmand dry.  Psychiatric: pleasant    Assessment/Plan: 1. Functional deficits secondary to right  CR, PLIC infarct which require 3+ hours per day of interdisciplinary therapy in a  comprehensive inpatient rehab setting.  Physiatrist is providing close team supervision and 24 hour management of active medical problems listed below.  Physiatrist and rehab team continue to assess barriers to discharge/monitor patient progress toward functional and medical goals  Care Tool:  Bathing  Bathing activity did not occur: Safety/medical concerns Body parts bathed by patient: Right arm, Left arm, Chest, Abdomen, Front perineal area, Right upper leg, Buttocks, Left upper leg, Right lower leg, Left lower leg, Face   Body parts bathed by helper: Buttocks, Right arm     Bathing assist Assist Level: Minimal Assistance - Patient > 75%(For balance while standing to wash buttocks)     Upper Body Dressing/Undressing Upper body dressing Upper body dressing/undressing activity did not occur (including orthotics): N/A(patient already have gown on) What is the patient wearing?: Dress    Upper body assist Assist Level: Supervision/Verbal cueing    Lower Body Dressing/Undressing Lower body dressing    Lower body dressing activity did not occur: N/A What is the patient wearing?: Pants     Lower body assist Assist for lower body dressing: Minimal Assistance - Patient > 75%     Toileting Toileting    Toileting assist Assist for toileting: Supervision/Verbal cueing     Transfers Chair/bed transfer  Transfers assist     Chair/bed transfer assist level: Minimal Assistance - Patient > 75%     Locomotion Ambulation   Ambulation assist      Assist level: Minimal Assistance - Patient > 75% Assistive device: Walker-rolling Max distance: 100'   Walk 10 feet activity   Assist  Assist level: Contact Guard/Touching assist Assistive device: Walker-rolling   Walk 50 feet activity   Assist    Assist level: Minimal Assistance - Patient > 75% Assistive device: Walker-rolling    Walk 150 feet activity   Assist Walk 150 feet activity did not occur:  Safety/medical concerns(decreased strength/activity tolerance)         Walk 10 feet on uneven surface  activity   Assist Walk 10 feet on uneven surfaces activity did not occur: Safety/medical concerns(decreased strength/activity tolerance)         Wheelchair     Assist   Type of Wheelchair: Manual    Wheelchair assist level: Set up assist, Supervision/Verbal cueing Max wheelchair distance: 105'    Wheelchair 50 feet with 2 turns activity    Assist        Assist Level: Set up assist, Supervision/Verbal cueing   Wheelchair 150 feet activity     Assist  Wheelchair 150 feet activity did not occur: Safety/medical concerns(decreased strength/activity tolerance)       Blood pressure 122/76, pulse 80, temperature 97.7 F (36.5 C), temperature source Oral, resp. rate 16, height 5' 3"  (1.6 m), weight 104.7 kg, last menstrual period 11/29/2015, SpO2 95 %.  Medical Problem List and Plan: 1.Left-sided weaknessand hemi-sensory losssecondary toright posterior corona radiata/PL IC infarction likely secondary small vessel disease. Plan 30-day event monitor as outpatient -continue CIR therapies today including PT and OT  -sl flexor tone in LUE which she is easily able to work through    2. Antithrombotics: -DVT/anticoagulation:Lovenox -antiplatelet therapy: Aspirin 81 mg daily, Plavix 75 mg daily x3 weeks then aspirin alone 3. Pain Management:Tylenol as needed  -continue robaxin prn for spasms   4. Mood:Celexa 20 mg daily, Wellbutrin 300 mg daily -antipsychotic agents: N/A  -ambien for sleep 5. Neuropsych: This patientiscapable of making decisions on herown behalf. 6. Skin/Wound Care:Routine skin checks 7. Fluids/Electrolytes/Nutrition:Routine in and outs with follow-up chemistries 8. Ulcerative colitis.Lialda2.4 mg twice daily -moved bowels twice early yesterday. None since  -continue  probiotic  -prn imodium 9. Hyperlipidemia. Lipitor 10. History of asthma and remote tobacco abuse. Albuterol nebulizer as needed.  -continuenasonex and claritin for congestion   -robitussin for cough   -sx improving  9/28- CXR (-)   11. ?Breast lump left, drainage  -pt will seen GYN at end of the month    LOS: 6 days A FACE TO [place] 05/31/2019, 10:08 AM

## 2019-05-31 NOTE — Progress Notes (Signed)
Occupational Therapy Session Note  Patient Details  Name: Carol Johnson MRN: [id number] Date of Birth: 09/13/1965  Today's Date: 05/31/2019 OT Individual Time: Session 1: [phone] session 2 1130-1200 Session 1: 75 mins  Session 2: 30 mins   Short Term Goals: Week 1:  OT Short Term Goal 1 (Week 1): Pt will complete LB bathing sit to stand with min guard assist. OT Short Term Goal 2 (Week 1): Pt will use the LUE as an active assist for bathing tasks with no more than mod assist. OT Short Term Goal 3 (Week 1): Pt will complete toilet transfer with min guard assist using the RW for support. OT Short Term Goal 4 (Week 1): Pt will complete LB dressing with min assist sit to stand.      Skilled Therapeutic Interventions/Progress Updates:    Session 1: Pt received in w/c and ready to tx and no c/o pain. Pt ambulates to shower with RW and CGA; completed UB bathing with (S) and LB bathing with (S) and CGA while standing to wash buttocks. Pt uses long handled sponge to wash buttocks and B LE. Pt completes UB and LB dressing with (S) and performs oral care at sink level with (S). Pt completes hair styling with use of B UE and self HOH to engage L UE in task. Pt applies makeup with (S) while sitting in w/c at sink.Pt uses L hand to hold small makeup containers and stabilize makeup tools while applying. Pt dons tennis shoes with min A to tie L shoe. Pt reports she would like to practice typing on a computer; OT provides pt with online typing game resource. Pt able to return demonstrate understanding and agreeable to attempting game to increase functional use of L hand digits and endurance during fine motor tasks to carryover to work tasks. Pt left sitting up in the w/c with all needs met.   Session 2:  Pt received in w/c, no c/o pain and ready for tx. Pt transported to therapy gym and OT provided strategies for tying shoe laces. Pt opted to try shoe buttons on tennis shoes for easier and more functional shoe  fastening strategy. OT demonstrated use an purpose of shoe button; pt able to return demonstrate without complications and is agreeable to continue use. Pt completes functional mobility on stairs in preparation for d/c to home and community environment. Pt completes 4 stairs using 1 handrail with min A and min VC for placing R leg on stairs first going up. Pt completes 4 stair task 3x. Pt completes practicing on 1 stair step with no handrail use with mod A for balance and min VC for widening BOS when stepping backwards off step. Pt ambulates with RW back to room with CGA. Exited session with pt in w/c and all needs met.   Therapy Documentation Precautions:  Precautions Precautions: Fall, Other (comment) Precaution Comments: L weakness Restrictions Weight Bearing Restrictions: No   Therapy/Group: Individual Therapy  Laren Orama 05/31/2019, 10:07 AM

## 2019-05-31 NOTE — Progress Notes (Signed)
Physical Therapy Session Note  Patient Details  Name: Carol Johnson MRN: [id number] Date of Birth: 12-Mar-1966  Today's Date: 05/31/2019 PT Individual Time: 1607-3710 PT Individual Time Calculation (min): 45 min   Short Term Goals: Week 1:  PT Short Term Goal 1 (Week 1): Patient will perform transfer with CGA. PT Short Term Goal 2 (Week 1): Patient will ambulate >100 feet with CGA using LRAD. PT Short Term Goal 3 (Week 1): Patient will perform dynamic standing balance with CGA without UE support.  Skilled Therapeutic Interventions/Progress Updates:    Per patient directed goals, focused on SUV/truck height transfer training, stair entry for mountain house access, and functional gait training with RW. Reviewed what to do in case of fall and demonstrated floor recovery. Pt declined attempting to practice due to h/o of inability to get on/off floor due to knee issues but verbalized understanding of what to do and PT demonstrated technique.   Demonstrated and attempted various techniques to simulate access to tall truck (her son's) including using running board. Required mod assist to complete and at this time recommend they need to practice the real truck to determine if a safe option or not. This set up was not realistic enough and would not recommend at d/c. Pt would be able to perform car or SUV without increased assist or difficulty and would recommend this at this time unless able to safely practice real truck prior to d/c. Discussed planning to attempt it during fam edu scheduled for this coming Sunday at 11 am. Request that primary PT follow up with scheduling this tomorrow.   Stair negotiation with L rail x 4 steps to simulate access to mountain house with CGA for balance and intermittent cues for attention to L foot placement. Recommended she could also do sideways as to be able to hold on with both UE's for support also. Then practiced 2 steps backwards with RW with min assist. At this time  would recommend 4 steps with rails for improved safety and decreased assist needed. Will benefit from continued practice prior to d/c.   Pt able to gait x 150' back to room with RW with initially overall CGA and then more min assist due to L toe drag and decreased balance with turns and obstacle negotiation. Education provided on fall risk and importance of attention to L foot placement.   Therapy Documentation Precautions:  Precautions Precautions: Fall, Other (comment) Precaution Comments: L weakness Restrictions Weight Bearing Restrictions: No Pain:  Denies pain.    Therapy/Group: Individual Therapy and Co-Treatment with Recreational Therapy  Canary Brim Ivory Broad, PT, DPT, CBIS  05/31/2019, 3:14 PM

## 2019-05-31 NOTE — Progress Notes (Signed)
Social Work Patient ID: Carol Johnson, female   DOB: December 08, 1965, 53 y.o.   MRN: [id number]  Have met with pt to review team conference.  She is aware of targeted d/c date 10/10 and supervision goals.  She is hopeful the date might be moved earlier if possible.  Very happy with progress she is making and looking forward to upcoming trip with son.  Continue to follow.  Keilly Fatula, LCSW

## 2019-05-31 NOTE — Progress Notes (Signed)
Physical Therapy Session Note  Patient Details  Name: Carol Johnson MRN: [id number] Date of Birth: 12-15-1965  Today's Date: 05/31/2019 PT Individual Time: 1630-1730 PT Individual Time Calculation (min): 60 min   Short Term Goals: Week 1:  PT Short Term Goal 1 (Week 1): Patient will perform transfer with CGA. PT Short Term Goal 2 (Week 1): Patient will ambulate >100 feet with CGA using LRAD. PT Short Term Goal 3 (Week 1): Patient will perform dynamic standing balance with CGA without UE support.  Skilled Therapeutic Interventions/Progress Updates:  Pt sitting upright in wheelchair upon arrival for therapy. Pt agreeable to treatment. Pt transported to therapy gym in wheelchair. Pt ambulated 75 feetx2 with RW and CGA. Pt completed weightshifting task stepping up and over hockey stick with L leg x3 with RW and CGA and verbal cueing for task and form. Pt completed (2 sets of 4 bilaterally) cone taps x4 with CGA and RW. Pt walked 8 feet into the parallel bars and completed lateral side stepping up and over cone (1 set of 8 bilaterally) x4 requiring verbal cueing for technique. Then pt walked 8 feet into the parallel bars with RW and CGA and completed lateral stepping up and over ladder while using bilateral UE support while completing task. Pt ambulated 150 feet to day room with CGA and RW. Pt transferred to NuStep with CGA and RW. Pt completed 10 minutes total on the NuStep with a rest break at 5 minutes under the following parameters: Workload: 1, RPE: 13, Total steps: 375. Pt required verbal cueing for symmetry of movement while completing the NuStep: i.e. providing equal force with both R LE and L LE. Pt transported back to room in wheelchair, left sitting upright, call bell within reach, and all needs met at this time.      Therapy Documentation Pain: Denies Pain    Therapy/Group: Individual Therapy  Olena Leatherwood, SPT  05/31/2019, 5:44 PM

## 2019-06-01 ENCOUNTER — Inpatient Hospital Stay (HOSPITAL_COMMUNITY): Payer: BC Managed Care – PPO

## 2019-06-01 ENCOUNTER — Inpatient Hospital Stay (HOSPITAL_COMMUNITY): Payer: BC Managed Care – PPO | Admitting: Physical Therapy

## 2019-06-01 ENCOUNTER — Inpatient Hospital Stay (HOSPITAL_COMMUNITY): Payer: BC Managed Care – PPO | Admitting: Occupational Therapy

## 2019-06-01 NOTE — Progress Notes (Signed)
Carol Johnson PHYSICAL MEDICINE & REHABILITATION PROGRESS NOTE   Subjective/Complaints: Pt progressing. Having more loose stool (d/t UC) Flowsheets only document a bowel movement this morning and yesterday--pt states that RN's haven't seen all stools.   ROS: Patient denies fever, rash, sore throat, blurred vision, nausea, vomiting,   cough, shortness of breath or chest pain, joint or back pain, headache, or mood change.    Objective:   No results found. No results for input(s): WBC, HGB, HCT, PLT in the last 72 hours. No results for input(s): NA, K, CL, CO2, GLUCOSE, BUN, CREATININE, CALCIUM in the last 72 hours.  Intake/Output Summary (Last 24 hours) at 06/01/2019 0925 Last data filed at 06/01/2019 0800 Gross per 24 hour  Intake 360 ml  Output -  Net 360 ml     Physical Exam: Vital Signs Blood pressure 139/79, pulse 70, temperature 97.6 F (36.4 C), temperature source Oral, resp. rate 18, height 5' 3"  (1.6 m), weight 104.7 kg, last menstrual period 11/29/2015, SpO2 97 %. Constitutional: No distress . Vital signs reviewed. HEENT: EOMI, oral membranes moist Neck: supple Cardiovascular: RRR without murmur. No JVD    Respiratory: CTA Bilaterally without wheezes or rales. Normal effort    GI: BS +, non-tender, non-distended  GYN: small lump just below nipple left breast. No change Musculoskeletal:Normal range of motion.  General: No edema.  Neurological: She isalertand oriented to person, place, and time. Normal insight and awareness. Functional memory and language. Left central 7 and left facial sensory loss. RUE and RLE 5/5. LUE 4/5 prox to 4-/5 distally. LLE   4-/ HF, KE, APF, ADF 3+ to 4-/5. DTR's remain 3+ left. Occasional catch left finger flexors/wrist/elbow--patient is still easily able to power through it.  Sensory 1/2 LUE and LLE.    Skin: Skin iswarmand dry.  Psychiatric: pleasant and appropriate    Assessment/Plan: 1. Functional deficits secondary to  right  CR, PLIC infarct which require 3+ hours per day of interdisciplinary therapy in a comprehensive inpatient rehab setting.  Physiatrist is providing close team supervision and 24 hour management of active medical problems listed below.  Physiatrist and rehab team continue to assess barriers to discharge/monitor patient progress toward functional and medical goals  Care Tool:  Bathing  Bathing activity did not occur: Safety/medical concerns Body parts bathed by patient: Chest, Left arm, Right arm, Abdomen, Front perineal area, Buttocks, Right upper leg, Left upper leg, Right lower leg, Left lower leg, Face   Body parts bathed by helper: Buttocks, Right arm     Bathing assist Assist Level: Supervision/Verbal cueing     Upper Body Dressing/Undressing Upper body dressing Upper body dressing/undressing activity did not occur (including orthotics): N/A(patient already have gown on) What is the patient wearing?: Bra, Dress    Upper body assist Assist Level: Supervision/Verbal cueing    Lower Body Dressing/Undressing Lower body dressing    Lower body dressing activity did not occur: N/A What is the patient wearing?: Pants     Lower body assist Assist for lower body dressing: Supervision/Verbal cueing     Toileting Toileting    Toileting assist Assist for toileting: Supervision/Verbal cueing     Transfers Chair/bed transfer  Transfers assist     Chair/bed transfer assist level: Contact Guard/Touching assist     Locomotion Ambulation   Ambulation assist      Assist level: Contact Guard/Touching assist Assistive device: Walker-rolling Max distance: 150 feet   Walk 10 feet activity   Assist     Assist  level: Contact Guard/Touching assist Assistive device: Walker-rolling   Walk 50 feet activity   Assist    Assist level: Contact Guard/Touching assist Assistive device: Walker-rolling    Walk 150 feet activity   Assist Walk 150 feet activity did  not occur: Safety/medical concerns(decreased strength/activity tolerance)  Assist level: Contact Guard/Touching assist Assistive device: Walker-rolling    Walk 10 feet on uneven surface  activity   Assist Walk 10 feet on uneven surfaces activity did not occur: Safety/medical concerns(decreased strength/activity tolerance)         Wheelchair     Assist   Type of Wheelchair: Manual    Wheelchair assist level: Set up assist, Supervision/Verbal cueing Max wheelchair distance: 105'    Wheelchair 50 feet with 2 turns activity    Assist        Assist Level: Set up assist, Supervision/Verbal cueing   Wheelchair 150 feet activity     Assist  Wheelchair 150 feet activity did not occur: Safety/medical concerns(decreased strength/activity tolerance)       Blood pressure 139/79, pulse 70, temperature 97.6 F (36.4 C), temperature source Oral, resp. rate 18, height 5' 3"  (1.6 m), weight 104.7 kg, last menstrual period 11/29/2015, SpO2 97 %.  Medical Problem List and Plan: 1.Left-sided weaknessand hemi-sensory losssecondary toright posterior corona radiata/PL IC infarction likely secondary small vessel disease. Plan 30-day event monitor as outpatient -continue CIR therapies today including PT and OT  -sl flexor tone in LUE which she is easily able to work through    -no indication for splinting or scheduled anti-spasmodics  2. Antithrombotics: -DVT/anticoagulation:Lovenox -antiplatelet therapy: Aspirin 81 mg daily, Plavix 75 mg daily x3 weeks then aspirin alone 3. Pain Management:Tylenol as needed  -continue robaxin prn for spasms   4. Mood:Celexa 20 mg daily, Wellbutrin 300 mg daily -antipsychotic agents: N/A  -ambien for sleep 5. Neuropsych: This patientiscapable of making decisions on herown behalf. 6. Skin/Wound Care:Routine skin checks 7. Fluids/Electrolytes/Nutrition:Routine in and outs with  follow-up chemistries 8. Ulcerative colitis.Lialda2.4 mg twice daily -more loose stools?   -pt states it's a little more than baseline  -continue probiotic  -reminded pt that she has prn imodium ordered 9. Hyperlipidemia. Lipitor 10. History of asthma and remote tobacco abuse. Albuterol nebulizer as needed.  -continuenasonex and claritin for congestion   -robitussin for cough   -sx improved     11. ?Breast lump left, drainage  -pt will seen GYN at end of the month    LOS: 7 days A FACE TO FACE EVALUATION WAS PERFORMED  Carol Johnson 06/01/2019, 9:25 AM

## 2019-06-01 NOTE — Progress Notes (Signed)
Occupational Therapy Session Note  Patient Details  Name: Carol Johnson MRN: [id number] Date of Birth: 12-06-1965  Today's Date: 06/01/2019 OT Individual Time: 1300-1330 OT Individual Time Calculation (min): 30 min    Short Term Goals: Week 1:  OT Short Term Goal 1 (Week 1): Pt will complete LB bathing sit to stand with min guard assist. OT Short Term Goal 2 (Week 1): Pt will use the LUE as an active assist for bathing tasks with no more than mod assist. OT Short Term Goal 3 (Week 1): Pt will complete toilet transfer with min guard assist using the RW for support. OT Short Term Goal 4 (Week 1): Pt will complete LB dressing with min assist sit to stand.  Skilled Therapeutic Interventions/Progress Updates:    1:1 NMR with focus on coordination of left hand in functional tasks. In standing performed alternating task of reaching to the left to place tags on board promoting weight shift to the left and reaching to the end range with scapular stability and picking up (poker) chips and placing them in small slot.  Able to maintain standing balance without  UE support with contact guard to supervision. Also practiced opening different medicine bottles with use of left hand as dominant and non dominant. Pt ambulated from dayroom back to her room with min A at slow speed/ extra time for safety and focus on coordination and balance.   Therapy Documentation Precautions:  Precautions Precautions: Fall, Other (comment) Precaution Comments: L weakness Restrictions Weight Bearing Restrictions: No General:   Vital Signs: Therapy Vitals Temp: 97.7 F (36.5 C) Pulse Rate: 76 Resp: 18 BP: 136/81 Patient Position (if appropriate): Sitting Oxygen Therapy SpO2: 96 % O2 Device: Room Air Pain:  no c/o pain    Therapy/Group: Individual Therapy  [facility] 06/01/2019, 4:48 PM

## 2019-06-01 NOTE — Progress Notes (Signed)
Physical Therapy Weekly Progress Note  Patient Details  Name: Carol Johnson MRN: [id number] Date of Birth: [birth year]  Beginning of progress report period: May 26, 2019 End of progress report period: June 01, 2019  Today's Date: 06/01/2019 PT Individual Time: 0806-0900 PT Individual Time Calculation (min): 54 min   Patient has met 3 of 3 short term goals.  Pt is making good progress towards LTG's and can currently ambulate with RW & CGA and requires up to min assist for stair negotiation with RW. Pt with continued ongoing ataxia in LLE during gait and decreased safety awareness with mobility. Pt would benefit from continued skilled PT treatment to focus on stair negotiation, transfer to elevated truck height, balance training, gait, and caregiver training.  Patient continues to demonstrate the following deficits muscle weakness, decreased cardiorespiratoy endurance, ataxia and decreased coordination, decreased awareness and decreased safety awareness, and decreased standing balance, decreased postural control, decreased balance strategies and L hemiparesis and therefore will continue to benefit from skilled PT intervention to increase functional independence with mobility.  Patient progressing toward long term goals..  Continue plan of care.  PT Short Term Goals Week 1:  PT Short Term Goal 1 (Week 1): Patient will perform transfer with CGA. PT Short Term Goal 1 - Progress (Week 1): Met PT Short Term Goal 2 (Week 1): Patient will ambulate >100 feet with CGA using LRAD. PT Short Term Goal 2 - Progress (Week 1): Met PT Short Term Goal 3 (Week 1): Patient will perform dynamic standing balance with CGA without UE support. PT Short Term Goal 3 - Progress (Week 1): Met Week 2:  PT Short Term Goal 1 (Week 2): STG = LTG due to estimated d/c date.  Skilled Therapeutic Interventions/Progress Updates:  Pt received in bed, reporting need to use restroom & therapist providing (mod assist)  assistance with supine>sit from bed flat 2/2 urgent need to use restroom. Pt ambulates into bathroom with CGA with RW and performs toilet transfer with supervision, managing clothing without assistance. Pt has continent void & BM on toilet, performing peri hygiene without assistance. Pt stands at sink with supervision while performing hand hygiene. While sitting EOB pt doffs shirt & dons bra & dress without assistance. Pt ambulates room>gym with RW & CGA with LLE ataxia noted & intermittent foot drag. Trialed L foot up brace to determine if it assists with occasional foot drag but no significant improvements/changes noted, so foot up brace not recommended. Pt demonstrates decreased safety awareness as noted but increased speed with turns & overall gait with education to improve safety with mobility. Pt negotiates 4 steps with L rail & CGA then 2 steps backwards with RW & min assist with cuing for compensatory pattern. At end of session pt left in w/c with all needs in reach. Per safety plan the chair alarm has been discontinued (as of 9/30) so therapist reinforced need for pt to call when wanting to get OOB or out of w/c.   No c/o pain during session.  Therapy Documentation Precautions:  Precautions Precautions: Fall, Other (comment) Precaution Comments: L weakness Restrictions Weight Bearing Restrictions: No   Therapy/Group: Individual Therapy  Waunita Schooner 06/01/2019, 12:55 PM

## 2019-06-01 NOTE — Progress Notes (Signed)
Physical Therapy Session Note  Patient Details  Name: Carol Johnson MRN: [id number] Date of Birth: Apr 27, 1966  Today's Date: 06/01/2019 PT Individual Time: [phone] and 1003 to 1101 PT Individual Time Calculation (min): 50 min and 58 min  Short Term Goals: Week 1:  PT Short Term Goal 1 (Week 1): Patient will perform transfer with CGA. PT Short Term Goal 1 - Progress (Week 1): Met PT Short Term Goal 2 (Week 1): Patient will ambulate >100 feet with CGA using LRAD. PT Short Term Goal 2 - Progress (Week 1): Met PT Short Term Goal 3 (Week 1): Patient will perform dynamic standing balance with CGA without UE support. PT Short Term Goal 3 - Progress (Week 1): Met Week 2:  PT Short Term Goal 1 (Week 2): STG = LTG due to estimated d/c date. Week 3:     Skilled Therapeutic Interventions/Progress Updates:  Pain L upper traps 3/10, therapy to tolerance.      wc propulsion 20f w/bilat UE's and cues for efficient stroke w/LUE  Gait 729fw/RW and ataxic advancement of LLE, cga.  Maintains approx 20degrees knee flexion at initial contact on L.  In parallel bars Standing eyes open/closed w/feet together x 20 sec each cga , standing w/cervical nods/rotation/arm raises w/feet together cga heelraises x 15 w/cga to min assist w/absent L ankle strategy w/balance challenge noted sidestepping w/slow advancement L, clues for clearance L [id number] retrogait 49f62f1 w/RW and cga  Standing alternating/tapping 5inch step x 15 each w/min to max assist for balance w/single limb stance on L, decreased control/foot placement L  Ring toss using RUE, stepping forward w/L 2x5, LUE stepping forward w/R for balance and wt shifting , min to max assist for blance.  Performed to promote wt shift/balance challenge.  Gait [id number] as above, progressed to Gait without AD 175ft,30ft w101f assist and vc's for increasing step length on L due to shortened vs RW., cues to relax LUE and promote armswing.  NuStep x 5min L271m   RPE5/10 for cardiovascular conditioning and bipedal activity.  Assessment:  Pt able to ambulate without AD today w/min assist for balance and cues for step length.  Balance is primary deficit limiting independence w/mobility.  Significantly delayed or absent balance reactions on L  PM session: Pain:  R knee 2/10, states previous knee pain treatment to tolerance.  Pt initially stating she needed to use BR.  Sit to stand and gait 15ft x 274fW and cga including slight incline/decline at doorway. Commode transfer w/RW and cga and verbal cues for safety.  Pt able to void and perform hygiene without assist.  Attempted to stand from commode without assist.  Therapist reminded pt not to get up without assist due to fall risk.   Stood and raised pants w/min assist.  wc propulsion x 25ft w/bi33fLE's for strengthening and bipedal activity  Pt transported to gym in wc.   Stairs:  Ascended/descended 12 stairs w/single rail and min assist, min verbal cues for sequencing.  Balance:  Pt sit to stand x 2 for donning, x 1 for doffing of bariatric harness for maxi sky. While in harness in standing pt performed 15 min on dynamic balance activities including: Ball toss and catch w/yellow beach ball using bilat UE's and stepping out of base of support w/balance loss controlled maxi sky.  Ball kick w/both RLE and LLE including step forward and lateral to approach ball  Much greater excursions performed by pt  in this safety harness than in am session without due to pt feeling safer.  Able to elicit delayed/compromised balance reactions on pts L w/this activity.   Pt returned to room via wc following session and left in wc w/chair alarm set and needs in reach.     Therapy Documentation Precautions:  Precautions Precautions: Fall, Other (comment) Precaution Comments: L weakness Restrictions Weight Bearing Restrictions: No Pain L upper traps 3/10, therapy to tolerance.    Therapy/Group: Individual  Therapy  Callie Fielding, PT   Jerrilyn Cairo 06/01/2019, 4:03 PM

## 2019-06-02 NOTE — Progress Notes (Signed)
[facility] PHYSICAL MEDICINE & REHABILITATION PROGRESS NOTE   Subjective/Complaints:  Pt reports she has no therapy today and is "bored". Is cleaning up her room to keep herself occupied.  Also notes, recovering from cold she had earlier in week.   ROS: Patient denies fever, rash, sore throat, blurred vision, nausea, vomiting,   cough, shortness of breath or chest pain, joint or back pain, headache, or mood change.    Objective:   No results found. No results for input(s): WBC, HGB, HCT, PLT in the last 72 hours. No results for input(s): NA, K, CL, CO2, GLUCOSE, BUN, CREATININE, CALCIUM in the last 72 hours.  Intake/Output Summary (Last 24 hours) at 06/02/2019 1410 Last data filed at 06/02/2019 1324 Gross per 24 hour  Intake 680 ml  Output 120 ml  Net 560 ml     Physical Exam: Vital Signs Blood pressure 124/82, pulse 70, temperature (!) 97.4 F (36.3 C), resp. rate 19, height 5' 3"  (1.6 m), weight 104.7 kg, last menstrual period 11/29/2015, SpO2 95 %. Constitutional: No distress . Vital signs reviewed. In w/c cleaning up her room HEENT: EOMI, oral membranes moist Neck: supple Cardiovascular: RRR without murmur. No JVD    Respiratory: CTA Bilaterally without wheezes or rales. Normal effort    GI: BS +, non-tender, non-distended  GYN: small lump just below nipple left breast. No change Musculoskeletal:Normal range of motion.  General: No edema.  Neurological: She isalertand oriented to person, place, and time. Normal insight and awareness. Functional memory and language. Left central 7 and left facial sensory loss. RUE and RLE 5/5. LUE 4/5 prox to 4-/5 distally. LLE   4-/ HF, KE, APF, ADF 3+ to 4-/5. DTR's remain 3+ left. Occasional catch left finger flexors/wrist/elbow--patient is still easily able to power through it.  Sensory 1/2 LUE and LLE.    Skin: Skin iswarmand dry.  Psychiatric: pleasant and appropriate    Assessment/Plan: 1. Functional deficits  secondary to right  CR, PLIC infarct which require 3+ hours per day of interdisciplinary therapy in a comprehensive inpatient rehab setting.  Physiatrist is providing close team supervision and 24 hour management of active medical problems listed below.  Physiatrist and rehab team continue to assess barriers to discharge/monitor patient progress toward functional and medical goals  Care Tool:  Bathing  Bathing activity did not occur: Safety/medical concerns Body parts bathed by patient: Chest, Left arm, Right arm, Abdomen, Front perineal area, Buttocks, Right upper leg, Left upper leg, Right lower leg, Left lower leg, Face   Body parts bathed by helper: Buttocks, Right arm     Bathing assist Assist Level: Supervision/Verbal cueing     Upper Body Dressing/Undressing Upper body dressing Upper body dressing/undressing activity did not occur (including orthotics): N/A(patient already have gown on) What is the patient wearing?: Bra, Dress    Upper body assist Assist Level: Supervision/Verbal cueing    Lower Body Dressing/Undressing Lower body dressing    Lower body dressing activity did not occur: N/A What is the patient wearing?: Pants     Lower body assist Assist for lower body dressing: Supervision/Verbal cueing     Toileting Toileting    Toileting assist Assist for toileting: Supervision/Verbal cueing     Transfers Chair/bed transfer  Transfers assist     Chair/bed transfer assist level: Contact Guard/Touching assist     Locomotion Ambulation   Ambulation assist      Assist level: Contact Guard/Touching assist Assistive device: Walker-rolling Max distance: 75   Walk 10  feet activity   Assist     Assist level: Contact Guard/Touching assist Assistive device: Walker-rolling   Walk 50 feet activity   Assist    Assist level: Contact Guard/Touching assist Assistive device: Walker-rolling    Walk 150 feet activity   Assist Walk 150 feet  activity did not occur: Safety/medical concerns(decreased strength/activity tolerance)  Assist level: Minimal Assistance - Patient > 75% Assistive device: (no AD)    Walk 10 feet on uneven surface  activity   Assist Walk 10 feet on uneven surfaces activity did not occur: Safety/medical concerns(decreased strength/activity tolerance)         Wheelchair     Assist   Type of Wheelchair: Manual    Wheelchair assist level: Set up assist, Supervision/Verbal cueing Max wheelchair distance: 105'    Wheelchair 50 feet with 2 turns activity    Assist        Assist Level: Set up assist, Supervision/Verbal cueing   Wheelchair 150 feet activity     Assist  Wheelchair 150 feet activity did not occur: Safety/medical concerns(decreased strength/activity tolerance)       Blood pressure 124/82, pulse 70, temperature (!) 97.4 F (36.3 C), resp. rate 19, height 5' 3"  (1.6 m), weight 104.7 kg, last menstrual period 11/29/2015, SpO2 95 %.  Medical Problem List and Plan: 1.Left-sided weaknessand hemi-sensory losssecondary toright posterior corona radiata/PL IC infarction likely secondary small vessel disease. Plan 30-day event monitor as outpatient -continue CIR therapies today including PT and OT  -sl flexor tone in LUE which she is easily able to work through    -no indication for splinting or scheduled anti-spasmodics  2. Antithrombotics: -DVT/anticoagulation:Lovenox -antiplatelet therapy: Aspirin 81 mg daily, Plavix 75 mg daily x3 weeks then aspirin alone 3. Pain Management:Tylenol as needed  -continue robaxin prn for spasms   4. Mood:Celexa 20 mg daily, Wellbutrin 300 mg daily -antipsychotic agents: N/A  -ambien for sleep 5. Neuropsych: This patientiscapable of making decisions on herown behalf. 6. Skin/Wound Care:Routine skin checks 7. Fluids/Electrolytes/Nutrition:Routine in and outs with follow-up  chemistries 8. Ulcerative colitis.Lialda2.4 mg twice daily -more loose stools?   -pt states it's a little more than baseline  -continue probiotic  -reminded pt that she has prn imodium ordered 9. Hyperlipidemia. Lipitor 10. History of asthma and remote tobacco abuse. Albuterol nebulizer as needed.  -continuenasonex and claritin for congestion   -robitussin for cough   -sx improved     11. ?Breast lump left, drainage  -pt will seen GYN at end of the month    LOS: 8 days A FACE TO FACE EVALUATION WAS PERFORMED  Odell Fasching 06/02/2019, 2:10 PM

## 2019-06-03 ENCOUNTER — Encounter (HOSPITAL_COMMUNITY): Payer: BC Managed Care – PPO

## 2019-06-03 ENCOUNTER — Ambulatory Visit (HOSPITAL_COMMUNITY): Payer: BC Managed Care – PPO

## 2019-06-03 NOTE — Care Management (Signed)
Inpatient [facility] Individual Statement of Services  Patient Name:  Carol Johnson  Date:  05/29/2019  Welcome to the [facility].  Our goal is to provide you with an individualized program based on your diagnosis and situation, designed to meet your specific needs.  With this comprehensive rehabilitation program, you will be expected to participate in at least 3 hours of rehabilitation therapies Monday-Friday, with modified therapy programming on the weekends.  Your rehabilitation program will include the following services:  Physical Therapy (PT), Occupational Therapy (OT), Speech Therapy (ST), 24 hour per day rehabilitation nursing, Therapeutic Recreaction (TR), Neuropsychology, Case Management (Social Worker), Rehabilitation Medicine, Nutrition Services and Pharmacy Services  Weekly team conferences will be held on Tuesdays to discuss your progress.  Your Social Worker will talk with you frequently to get your input and to update you on team discussions.  Team conferences with you and your family in attendance may also be held.  Expected length of stay: 14-16 days   Overall anticipated outcome: supervision  Depending on your progress and recovery, your program may change. Your Social Worker will coordinate services and will keep you informed of any changes. Your Social Worker's name and contact numbers are listed  below.  The following services may also be recommended but are not provided by the [facility] will be made to provide these services after discharge if needed.  Arrangements include referral to agencies that provide these services.  Your insurance has been verified to be:  [facility] Your primary doctor is:  [facility]  Pertinent information will be shared with your doctor and your  insurance company.  Social Worker:  [place], [place] or ([phone]   Information discussed with and copy given to patient by: Lennart Pall, 05/29/2019, 2:48 PM

## 2019-06-03 NOTE — Progress Notes (Signed)
[facility] PHYSICAL MEDICINE & REHABILITATION PROGRESS NOTE   Subjective/Complaints:  Pt reports she's getting a shower now and has therapy today- her son is bringing his big pickup truck in and she's doing car transfers with PT.  No issues.   ROS: Patient denies fever, rash, sore throat, blurred vision, nausea, vomiting,   cough, shortness of breath or chest pain, joint or back pain, headache, or mood change.    Objective:   No results found. No results for input(s): WBC, HGB, HCT, PLT in the last 72 hours. No results for input(s): NA, K, CL, CO2, GLUCOSE, BUN, CREATININE, CALCIUM in the last 72 hours.  Intake/Output Summary (Last 24 hours) at 06/03/2019 1212 Last data filed at 06/03/2019 0823 Gross per 24 hour  Intake 1020 ml  Output -  Net 1020 ml     Physical Exam: Vital Signs Blood pressure 131/80, pulse 64, temperature 97.7 F (36.5 C), resp. rate 19, height 5' 3"  (1.6 m), weight 104.7 kg, last menstrual period 11/29/2015, SpO2 96 %. Constitutional: No distress . Vital signs reviewed. In shower in bathroom with NT in room, NAD HEENT: EOMI, oral membranes moist Neck: supple Cardiovascular: RRR without murmur. No JVD    Respiratory: CTA Bilaterally without wheezes or rales. Normal effort    GI: BS +, non-tender, non-distended  GYN: small lump just below nipple left breast. No change Musculoskeletal:Normal range of motion.  General: No edema.  Neurological: She isalertand oriented to person, place, and time. Normal insight and awareness. Functional memory and language. Left central 7 and left facial sensory loss. RUE and RLE 5/5. LUE 4/5 prox to 4-/5 distally. LLE   4-/ HF, KE, APF, ADF 3+ to 4-/5. DTR's remain 3+ left. Occasional catch left finger flexors/wrist/elbow--patient is still easily able to power through it.  Sensory 1/2 LUE and LLE.    Skin: Skin iswarmand dry.  Psychiatric: pleasant and appropriate    Assessment/Plan: 1. Functional deficits  secondary to right  CR, PLIC infarct which require 3+ hours per day of interdisciplinary therapy in a comprehensive inpatient rehab setting.  Physiatrist is providing close team supervision and 24 hour management of active medical problems listed below.  Physiatrist and rehab team continue to assess barriers to discharge/monitor patient progress toward functional and medical goals  Care Tool:  Bathing  Bathing activity did not occur: Safety/medical concerns Body parts bathed by patient: Chest, Left arm, Right arm, Abdomen, Front perineal area, Buttocks, Right upper leg, Left upper leg, Right lower leg, Left lower leg, Face   Body parts bathed by helper: Buttocks, Right arm     Bathing assist Assist Level: Supervision/Verbal cueing     Upper Body Dressing/Undressing Upper body dressing Upper body dressing/undressing activity did not occur (including orthotics): N/A(patient already have gown on) What is the patient wearing?: Bra, Dress    Upper body assist Assist Level: Supervision/Verbal cueing    Lower Body Dressing/Undressing Lower body dressing    Lower body dressing activity did not occur: N/A What is the patient wearing?: Pants     Lower body assist Assist for lower body dressing: Supervision/Verbal cueing     Toileting Toileting    Toileting assist Assist for toileting: Supervision/Verbal cueing     Transfers Chair/bed transfer  Transfers assist     Chair/bed transfer assist level: Contact Guard/Touching assist     Locomotion Ambulation   Ambulation assist      Assist level: Contact Guard/Touching assist Assistive device: Walker-rolling Max distance: 75   Walk  10 feet activity   Assist     Assist level: Contact Guard/Touching assist Assistive device: Walker-rolling   Walk 50 feet activity   Assist    Assist level: Contact Guard/Touching assist Assistive device: Walker-rolling    Walk 150 feet activity   Assist Walk 150 feet  activity did not occur: Safety/medical concerns(decreased strength/activity tolerance)  Assist level: Minimal Assistance - Patient > 75% Assistive device: (no AD)    Walk 10 feet on uneven surface  activity   Assist Walk 10 feet on uneven surfaces activity did not occur: Safety/medical concerns(decreased strength/activity tolerance)         Wheelchair     Assist   Type of Wheelchair: Manual    Wheelchair assist level: Set up assist, Supervision/Verbal cueing Max wheelchair distance: 105'    Wheelchair 50 feet with 2 turns activity    Assist        Assist Level: Set up assist, Supervision/Verbal cueing   Wheelchair 150 feet activity     Assist  Wheelchair 150 feet activity did not occur: Safety/medical concerns(decreased strength/activity tolerance)       Blood pressure 131/80, pulse 64, temperature 97.7 F (36.5 C), resp. rate 19, height 5' 3"  (1.6 m), weight 104.7 kg, last menstrual period 11/29/2015, SpO2 96 %.  Medical Problem List and Plan: 1.Left-sided weaknessand hemi-sensory losssecondary toright posterior corona radiata/PL IC infarction likely secondary small vessel disease. Plan 30-day event monitor as outpatient -continue CIR therapies today including PT and OT  -sl flexor tone in LUE which she is easily able to work through    -no indication for splinting or scheduled anti-spasmodics   - doing car transfers today 2. Antithrombotics: -DVT/anticoagulation:Lovenox -antiplatelet therapy: Aspirin 81 mg daily, Plavix 75 mg daily x3 weeks then aspirin alone 3. Pain Management:Tylenol as needed  -continue robaxin prn for spasms   4. Mood:Celexa 20 mg daily, Wellbutrin 300 mg daily -antipsychotic agents: N/A  -ambien for sleep 5. Neuropsych: This patientiscapable of making decisions on herown behalf. 6. Skin/Wound Care:Routine skin checks 7. Fluids/Electrolytes/Nutrition:Routine in  and outs with follow-up chemistries 8. Ulcerative colitis.Lialda2.4 mg twice daily -more loose stools?   -pt states it's a little more than baseline  -continue probiotic  -reminded pt that she has prn imodium ordered 9. Hyperlipidemia. Lipitor 10. History of asthma and remote tobacco abuse. Albuterol nebulizer as needed.  -continuenasonex and claritin for congestion   -robitussin for cough   -sx improved     11. ?Breast lump left, drainage  -pt will seen GYN at end of the month    LOS: 9 days A FACE TO FACE EVALUATION WAS PERFORMED  Abe Schools 06/03/2019, 12:12 PM

## 2019-06-03 NOTE — Progress Notes (Signed)
Occupational Therapy Weekly Progress Note  Patient Details  Name: Carol Johnson MRN: [id number] Date of Birth: 07/27/1966  Beginning of progress report period: May 26, 2019 End of progress report period: June 03, 2019  Today's Date: 06/03/2019 OT Individual Time: 1300-1409 OT Individual Time Calculation (min): 69 min    Patient has met 4 of 4 short term goals.  Pt has made excellent progress in her OT POC this reporting period. She is able to use her LUE at a [place] level IV-V with deficits remaining in strength and coordination, as well as shoulder flexion with scapular stability. Pt is able to bathe and dress at a (S) level. She completes ADL transfers with CGA. Family education is taking place this date with pt's son and equipment for d/c has been ordered.   Patient continues to demonstrate the following deficits: impaired timing and sequencing and decreased coordination and decreased standing balance, decreased postural control and hemiplegia and therefore will continue to benefit from skilled OT intervention to enhance overall performance with iADL and School/Education.  Patient progressing toward long term goals..  Continue plan of care.  OT Short Term Goals Week 1:  OT Short Term Goal 1 (Week 1): Pt will complete LB bathing sit to stand with min guard assist. OT Short Term Goal 1 - Progress (Week 1): Met OT Short Term Goal 2 (Week 1): Pt will use the LUE as an active assist for bathing tasks with no more than mod assist. OT Short Term Goal 2 - Progress (Week 1): Met OT Short Term Goal 3 (Week 1): Pt will complete toilet transfer with min guard assist using the RW for support. OT Short Term Goal 3 - Progress (Week 1): Met OT Short Term Goal 4 (Week 1): Pt will complete LB dressing with min assist sit to stand. OT Short Term Goal 4 - Progress (Week 1): Met Week 2:  OT Short Term Goal 1 (Week 2): STG= LTG d/t ELOS  Skilled Therapeutic Interventions/Progress Updates:   Pt received sitting up in w/c with no c/o pain. Reviewed family education with pt from previous PT session. Pt was discussing incontinence with coughing and pt was introduced to the idea of pelvic floor PT and told to discuss with her PCP. Pt requested to practice bed mobility. Pt completed 200 ft of functional mobility using her RW to the ADL apt. She transferred onto a low couch and required min A to complete sit > stand from it. Edu provided re safe transfer techniques for her son to use and strategies to make transfer from low furniture easier. Pt transferred onto the bed and into supine before completing bed mobility with (S). Edu pt on use of bed rails to increase ease and safety. Upon return to room pt was assisted in finding a bed rail online to purchase. Pt completed dynavision with LUE only, using entire board. Pt completed 3 min continuous with a reaction time of 1.98 sec. Pt with good scapular stability as she reached to 110 degrees of shoulder flexion. Pt returned to her room and completed toileting tasks with (S). Pt left sitting in her w/c with all needs met.   Therapy Documentation Precautions:  Precautions Precautions: Fall, Other (comment) Precaution Comments: L weakness Restrictions Weight Bearing Restrictions: No   Therapy/Group: Individual Therapy   H  06/03/2019, 7:23 AM   

## 2019-06-03 NOTE — Progress Notes (Signed)
Physical Therapy Session Note  Patient Details  Name: Carol Johnson MRN: [id number] Date of Birth: [birth year]  Today's Date: 06/03/2019 PT Individual Time: 1100-1200 PT Individual Time Calculation (min): 60 min   Short Term Goals: Week 2:  PT Short Term Goal 1 (Week 2): STG = LTG due to estimated d/c date.  Skilled Therapeutic Interventions/Progress Updates:     Patient in w/c with her son in the room upon PT arrival. Patient alert and agreeable to PT session. Patient denied pain throughout session. Patient's son participated in family education throughout session.   Therapeutic Activity: Transfers: Patient performed sit to/from stand x8 with close supervision using a RW with PT and son providing guarding throughout session. Provided verbal cues for her son to guard her on the L side as she is more likely to fall to the L.  Patient performed a car transfer into her son's trunk at the [place] [facility] entrance x2 with min A-CGA. She entered the trunk ascending a single step-up forwards leading with her R foot holding onto a sturdy handle then turning to sit in the seat. She exited the vehicle holding the handle again and lowering her self out backwards leading with her L foot. Required min cues for L foot placement on the ground by PT and her son each trial. Educated on risk of improper foot placement while exiting the vehicle due to ataxia in her L LE, patient and her son very receptive to education.   Gait Training:  Patient ambulated ~150 feet outside on unlevel sidewalk using RW with CGA-close supervision for safety/balance. Ambulated with mild ataxia of L LE with variable foot placement and intermittent steppage gait on L. Provided verbal cues for pushing RW rather than lifting RW for increased balance with ambulation. She ambulated 20 feet in the room without a RW, to demonstrate her progress for her son. Required CGA for safety with decreased gait speed,step length, and step height without AD  and intermittently reaching for furniture for balance. She also ambulated up/down a ramp, over 10' of multch, and up/down a curb using a RW with CGA for balance/safety with her son providing safe guarding on the L side throughout. Provided cues for lifting RW on mulch, patient's son being on the downhill side when guarding patient on the curb, and assisting with the RW on the curb for patient's safety.  She went up/down 2-6" steps using the RW, ascending backwards leading with the R foot and descending forwards leading with the L foot, with CGA x1 with PT and x1 with her son and min A x1 with her son due to minor LOB when stepping up. Patient recollected herself calmly after LOB and took her time completing the step. Patient's son provided safe guarding throughout.  She went up/down 4-6" steps with a L rail ascending and descending forwards with step to gait pattern leading with R ascending and L descending with CGA x1 from PT and x2 from her son. Patient and her son able to teach back which foot leads when ascending and descending.  PT provided demonstration and cues for proper technique throughout stair training.    Wheelchair Mobility:  Patient propelled wheelchair 50 feet with supervision outside on a side walk for community mobility. Provided verbal cues for propulsion and turning technique. Patient otherwise transported with total A by her son in the w/c for energy conservation to/form the [employer]	[place] during session.   Patient in w/c with her son in the room at end of  session with breaks locked and all needs within reach. Patient's son demonstrated proper technique and safe guarding with all mobility throughout session. Educated on fall risk/prevention and recovery throughout session. Patient and her son were very receptive to all education.    Therapy Documentation Precautions:  Precautions Precautions: Fall, Other (comment) Precaution Comments: L weakness Restrictions Weight Bearing  Restrictions: No    Therapy/Group: Individual Therapy  Tamarius Rosenfield L Ayaka Andes PT, DPT  06/03/2019, 3:44 PM

## 2019-06-04 ENCOUNTER — Inpatient Hospital Stay (HOSPITAL_COMMUNITY): Payer: BC Managed Care – PPO

## 2019-06-04 ENCOUNTER — Inpatient Hospital Stay (HOSPITAL_COMMUNITY): Payer: BC Managed Care – PPO | Admitting: Physical Therapy

## 2019-06-04 DIAGNOSIS — D62 Acute posthemorrhagic anemia: Secondary | ICD-10-CM

## 2019-06-04 DIAGNOSIS — E876 Hypokalemia: Secondary | ICD-10-CM

## 2019-06-04 DIAGNOSIS — K51919 Ulcerative colitis, unspecified with unspecified complications: Secondary | ICD-10-CM

## 2019-06-04 NOTE — Progress Notes (Signed)
Physical Therapy Session Note  Patient Details  Name: Carol Johnson MRN: [id number] Date of Birth: 1966/02/20  Today's Date: 06/04/2019 PT Individual Time: 1350-1445 PT Individual Time Calculation (min): 55 min   Short Term Goals: Week 2:  PT Short Term Goal 1 (Week 2): STG = LTG due to estimated d/c date.  Skilled Therapeutic Interventions/Progress Updates:  Pt received in room with sister present for caregiver training. Discussed DME recommendations (w/c for community use, RW) & OPPT f/u. Educated her on need for supervision at all times & to be close enough to assist pt if she beings losing her balance. Pt ambulates around unit with RW & supervision with decreased speed on linear paths & turning, and improved safety. Pt completes car transfer with supervision & cuing to sit & transfer BLE in/out of car vs stepping in/out. Pt negotiates ramp & mulch with RW & supervision & curb step with RW & CGA. Pt negotiates 2 steps backwards with RW & min assist with therapist then sister stabilizing RW then 4 steps with L rail & supervision with occasional cuing for compensatory pattern. Pt completes bed mobility in room with bed elevated to simulate her's at home, bed flat & no rails with supervision overall. Educated pt/family to remove or secure throw rugs to prevent tripping hazards. Pt & sister voice no concerns regarding training completed today. Pt left in w/c with sister present in room, call bell in reach.  Pt with c/o pain in R hand 2/2 pushing down on RW with therapist educating pt to lighten grip on AD.  Therapy Documentation Precautions:  Precautions Precautions: Fall, Other (comment) Precaution Comments: L weakness Restrictions Weight Bearing Restrictions: No   Therapy/Group: Individual Therapy  Waunita Schooner 06/04/2019, 3:58 PM

## 2019-06-04 NOTE — Progress Notes (Signed)
Occupational Therapy Session Note  Patient Details  Name: Carol Johnson MRN: [id number] Date of Birth: 1966-01-17  Today's Date: 06/04/2019 OT Individual Time: 7253-6644 OT Individual Time Calculation (min): 60 min   Session 2: OT Individual Time: 1045-1200 OT Individual Time Calculation (min): 75 min    Short Term Goals: Week 2:  OT Short Term Goal 1 (Week 2): STG= LTG d/t ELOS  Skilled Therapeutic Interventions/Progress Updates:    Session 1: Pt received supine with no c/o pain. Pt completed bed mobility to EOB with (S) using bed rails. Pt completed functional mobility into the bathroom and transferred onto low toilet with (S), using RW. Pt voided urine and then transferred into shower with close (S) onto bari BSC. Pt sat and completed all bathing with intermittent stand to wash LB with use of LH sponge, (S)- mod I overall. Pt has demonstrated improved safety awareness and requires less cueing for RW management and safe transfers overall. Pt had posterior LOB requiring min A to correct when standing from [facility] in shower. Pt used RW to return to EOB where she donned LB clothing with (S). Pt stood and donned sports bra and shirt. Edu provided re remaining seated for UB clothing donning. Pt stood at sink and completed grooming tasks with (S). Pt able to don shoes EOB with use of shoe buttons with set up assist. Pt completed 200 ft of functional mobility with the RW with (S). Pt required seated rest break and then completed 40 ft of functional mobility without the RW, requiring min A. Pt returned to her room and was left sitting up with all needs met.   Session 2: Pt received sitting up in the w/c with no c/o pain. Pt completed toileting tasks, reporting a small BM, with (S). Pt completed 100 ft of functional mobility to the dayroom. Pt played Wii Bowling, using her L hand to hold and manipulate the controller. Pt required intermittent facilitation to position remote. Pt completed 15+ minutes standing  with intermittent CGA for balance support. Pt then completed 300 ft of functional mobility to the elevators. She transitioned into the w/c and was transported off unit to the atrium. Discussed return to ADLs/IADLs, general health/well being, and safety. Pt completed 100 ft of functional mobility over uneven surfaces with (S). Pt was returned to her room and left sitting up with all needs met.   Therapy Documentation Precautions:  Precautions Precautions: Fall, Other (comment) Precaution Comments: L weakness Restrictions Weight Bearing Restrictions: No   Therapy/Group: Individual Therapy  Curtis Sites 06/04/2019, 7:12 AM

## 2019-06-04 NOTE — Progress Notes (Signed)
[place] PHYSICAL MEDICINE & REHABILITATION PROGRESS NOTE   Subjective/Complaints: Patient seen sitting up in bed this morning.  She states she did not sleep well overnight due to staff in/out of the room.  ROS: Denies CP, SOB, N/V/D  Objective:   No results found. No results for input(s): WBC, HGB, HCT, PLT in the last 72 hours. No results for input(s): NA, K, CL, CO2, GLUCOSE, BUN, CREATININE, CALCIUM in the last 72 hours.  Intake/Output Summary (Last 24 hours) at 06/04/2019 1147 Last data filed at 06/04/2019 0900 Gross per 24 hour  Intake 1280 ml  Output -  Net 1280 ml     Physical Exam: Vital Signs Blood pressure 127/74, pulse 66, temperature 97.7 F (36.5 C), temperature source Oral, resp. rate 19, height 5' 3"  (1.6 m), weight 104.7 kg, last menstrual period 11/29/2015, SpO2 97 %. Constitutional: No distress . Vital signs reviewed. HENT: Normocephalic.  Atraumatic. Eyes: EOMI. No discharge. Cardiovascular: No JVD. Respiratory: Normal effort.  No stridor. GI: Non-distended. Skin: Warm and dry.  Intact. Psych: Normal mood.  Normal behavior. Musc: No edema in extremities.  No tenderness in extremities. Neurological: Alert Motor: RUE/RLE: 5/5 proximal distal LUE/LLE: 4-4+/5 proximal to distal Sensation intact to light touch  Assessment/Plan: 1. Functional deficits secondary to right  CR, PLIC infarct which require 3+ hours per day of interdisciplinary therapy in a comprehensive inpatient rehab setting.  Physiatrist is providing close team supervision and 24 hour management of active medical problems listed below.  Physiatrist and rehab team continue to assess barriers to discharge/monitor patient progress toward functional and medical goals  Care Tool:  Bathing  Bathing activity did not occur: Safety/medical concerns Body parts bathed by patient: Chest, Left arm, Right arm, Abdomen, Front perineal area, Buttocks, Right upper leg, Left upper leg, Right lower leg,  Left lower leg, Face   Body parts bathed by helper: Buttocks, Right arm     Bathing assist Assist Level: Supervision/Verbal cueing     Upper Body Dressing/Undressing Upper body dressing Upper body dressing/undressing activity did not occur (including orthotics): N/A(patient already have gown on) What is the patient wearing?: Bra, Dress    Upper body assist Assist Level: Supervision/Verbal cueing    Lower Body Dressing/Undressing Lower body dressing    Lower body dressing activity did not occur: N/A What is the patient wearing?: Pants     Lower body assist Assist for lower body dressing: Supervision/Verbal cueing     Toileting Toileting    Toileting assist Assist for toileting: Supervision/Verbal cueing     Transfers Chair/bed transfer  Transfers assist     Chair/bed transfer assist level: Supervision/Verbal cueing     Locomotion Ambulation   Ambulation assist      Assist level: Contact Guard/Touching assist Assistive device: Walker-rolling Max distance: ~150 feet outside on unlevel surfaces   Walk 10 feet activity   Assist     Assist level: Contact Guard/Touching assist Assistive device: No Device   Walk 50 feet activity   Assist    Assist level: Contact Guard/Touching assist(outside) Assistive device: Walker-rolling    Walk 150 feet activity   Assist Walk 150 feet activity did not occur: Safety/medical concerns(decreased strength/activity tolerance)  Assist level: Contact Guard/Touching assist(outside) Assistive device: Walker-rolling    Walk 10 feet on uneven surface  activity   Assist Walk 10 feet on uneven surfaces activity did not occur: Safety/medical concerns(decreased strength/activity tolerance)   Assist level: Contact Guard/Touching assist Assistive device: Chemical engineer  Assist   Type of Wheelchair: Manual    Wheelchair assist level: Supervision/Verbal cueing Max wheelchair distance:  50'(outside on unlevel surfaces)    Wheelchair 50 feet with 2 turns activity    Assist        Assist Level: Supervision/Verbal cueing   Wheelchair 150 feet activity     Assist  Wheelchair 150 feet activity did not occur: Safety/medical concerns(decreased strength/activity tolerance)       Blood pressure 127/74, pulse 66, temperature 97.7 F (36.5 C), temperature source Oral, resp. rate 19, height 5' 3"  (1.6 m), weight 104.7 kg, last menstrual period 11/29/2015, SpO2 97 %.  Medical Problem List and Plan: 1.Left-sided weaknessand hemi-sensory losssecondary toright posterior corona radiata/PL IC infarction likely secondary small vessel disease. Plan 30-day event monitor as outpatient Continue CIR 2. Antithrombotics: -DVT/anticoagulation:Lovenox -antiplatelet therapy: Aspirin 81 mg daily, Plavix 75 mg daily x3 weeks then aspirin alone 3. Pain Management:Tylenol as needed  -continue robaxin prn for spasms   4. Mood:Celexa 20 mg daily, Wellbutrin 300 mg daily -antipsychotic agents: N/A  -ambien for sleep 5. Neuropsych: This patientiscapable of making decisions on herown behalf. 6. Skin/Wound Care:Routine skin checks 7. Fluids/Electrolytes/Nutrition:Routine in and outs 8. Ulcerative colitis.Lialda2.4 mg twice daily  -pt states it's a little more than baseline  -continue probiotic  Imodium as needed 9. Hyperlipidemia. Lipitor 10. History of asthma and remote tobacco abuse. Albuterol nebulizer as needed.  -continuenasonex and claritin for congestion   -robitussin for cough   -sx improved 11. ?Breast lump left, drainage  -pt will seen GYN at end of the month 12.  Acute blood loss anemia  Hemoglobin 11.5 on 9/26, labs ordered for tomorrow 13.  Hypokalemia  Potassium 3.2 on 9/26, labs ordered for tomorrow   LOS: 10 days A FACE TO FACE EVALUATION WAS PERFORMED  Justn Quale Lorie Phenix 06/04/2019, 11:47 AM

## 2019-06-05 ENCOUNTER — Inpatient Hospital Stay (HOSPITAL_COMMUNITY): Payer: BC Managed Care – PPO | Admitting: Physical Therapy

## 2019-06-05 ENCOUNTER — Inpatient Hospital Stay (HOSPITAL_COMMUNITY): Payer: BC Managed Care – PPO

## 2019-06-05 DIAGNOSIS — F329 Major depressive disorder, single episode, unspecified: Secondary | ICD-10-CM

## 2019-06-05 LAB — BASIC METABOLIC PANEL
Anion gap: 10 (ref 5–15)
BUN: 11 mg/dL (ref 6–20)
CO2: 25 mmol/L (ref 22–32)
Calcium: 9.3 mg/dL (ref 8.9–10.3)
Chloride: 105 mmol/L (ref 98–111)
Creatinine, Ser: 0.81 mg/dL (ref 0.44–1.00)
GFR calc Af Amer: >60 mL/min (ref >60)
GFR calc non Af Amer: >60 mL/min (ref >60)
Glucose, Bld: 99 mg/dL (ref 70–99)
Potassium: 4.1 mmol/L (ref 3.5–5.1)
Sodium: 140 mmol/L (ref 135–145)

## 2019-06-05 MED ORDER — ATORVASTATIN CALCIUM 80 MG PO TABS: 80.0000 mg | ORAL_TABLET | Freq: Every day | ORAL | 0 refills | Status: DC

## 2019-06-05 MED ORDER — CITALOPRAM HYDROBROMIDE 20 MG PO TABS: 20.0000 mg | ORAL_TABLET | Freq: Every day | ORAL | 3 refills | Status: DC

## 2019-06-05 MED ORDER — BUPROPION HCL ER (XL) 300 MG PO TB24: 300.0000 mg | ORAL_TABLET | Freq: Every day | ORAL | 3 refills | Status: DC

## 2019-06-05 MED ORDER — CLOPIDOGREL BISULFATE 75 MG PO TABS: 75.0000 mg | ORAL_TABLET | Freq: Every day | ORAL | 0 refills | Status: DC

## 2019-06-05 MED ORDER — LOPERAMIDE HCL 2 MG PO CAPS: 2.0000 mg | ORAL_CAPSULE | ORAL | 0 refills | Status: AC | PRN

## 2019-06-05 MED ORDER — SACCHAROMYCES BOULARDII 250 MG PO CAPS: 250.0000 mg | ORAL_CAPSULE | Freq: Two times a day (BID) | ORAL | 0 refills | Status: DC

## 2019-06-05 MED ORDER — PANTOPRAZOLE SODIUM 40 MG PO TBEC: 40.0000 mg | DELAYED_RELEASE_TABLET | Freq: Every day | ORAL | 3 refills | Status: DC

## 2019-06-05 MED ORDER — METHOCARBAMOL 500 MG PO TABS: 500.0000 mg | ORAL_TABLET | Freq: Four times a day (QID) | ORAL | 0 refills | Status: DC | PRN

## 2019-06-05 MED ORDER — ACETAMINOPHEN 325 MG PO TABS: 650.0000 mg | ORAL_TABLET | ORAL | Status: AC | PRN

## 2019-06-05 NOTE — Plan of Care (Signed)
  Problem: Consults Goal: RH STROKE PATIENT EDUCATION Description: See Patient Education module for education specifics  Outcome: Progressing   Problem: RH SKIN INTEGRITY Goal: RH STG MAINTAIN SKIN INTEGRITY WITH ASSISTANCE Description: STG Maintain Skin Integrity With min Assistance. Outcome: Progressing Flowsheets (Taken 06/05/2019 1348) STG: Maintain skin integrity with assistance: 5-Supervision/set up Goal: RH STG ABLE TO PERFORM INCISION/WOUND CARE W/ASSISTANCE Description: STG Able To Perform Wound Care With min Assistance. Outcome: Progressing Flowsheets (Taken 06/05/2019 1348) STG: Pt will be able to perform incision/wound care with assistance: 5-Supervision/set up   Problem: RH SAFETY Goal: RH STG ADHERE TO SAFETY PRECAUTIONS W/ASSISTANCE/DEVICE Description: STG Adhere to Safety Precautions With min Assistance and appropriate assistive Device. Outcome: Progressing Flowsheets (Taken 06/05/2019 1348) STG:Pt will adhere to safety precautions with assistance/device: 5-Supervision/set up   Problem: RH PAIN MANAGEMENT Goal: RH STG PAIN MANAGED AT OR BELOW PT'S PAIN GOAL Description: <3 on a 0-10 pain scale Outcome: Progressing   Problem: RH KNOWLEDGE DEFICIT Goal: RH STG INCREASE KNOWLEDGE OF HYPERTENSION Description: Patient will demonstrate knowledge of HTN medications, dietary restrictions, follow-up care with the MD post discharge with min assist from staff. Outcome: Progressing Goal: RH STG INCREASE KNOWLEGDE OF HYPERLIPIDEMIA Description: Patient will demonstrate knowledge of HLD medications, dietary restrictions, follow-up care with the MD post discharge with min assist from staff.  Outcome: Progressing Goal: RH STG INCREASE KNOWLEDGE OF STROKE PROPHYLAXIS Description: Patient will demonstrate knowledge of Stroke prevention medications, dietary restrictions, follow-up care with the MD post discharge with min assist from staff.  Outcome: Progressing

## 2019-06-05 NOTE — Progress Notes (Signed)
  Patient ID: Carol Johnson, female   DOB: 12-May-1966, 53 y.o.   MRN: [id number]     Diagnosis codes:  I69.354  Height:  5'3"              Weight:  231 lbs          Patient suffers from a right CVA with left hemiplegia which impairs their ability to perform daily activities like bathing, dressing and mobility in the home.  A walker will not resolve issue with performing activities of daily living.  A wheelchair will allow patient to safely perform daily activities.  Patient is not able to propel themselves in the home using a standard weight wheelchair due to hemiplegia and weakness.  Patient can self propel in the lightweight wheelchair.  Lauraine Rinne, PA-C

## 2019-06-05 NOTE — Progress Notes (Signed)
Physical Therapy Discharge Summary  Patient Details  Name: Carol Johnson MRN: [id number] Date of Birth: 05-08-1966  Today's Date: 06/05/2019 PT Individual Time: 1120-1200 and 8366-2947 PT Individual Time Calculation (min): 40 min and 71 min    Patient has met 9 of 9 long term goals due to improved activity tolerance, improved balance, improved postural control, increased strength, ability to compensate for deficits, functional use of  left upper extremity and left lower extremity, improved attention, improved awareness and improved coordination.  Patient to discharge at an ambulatory level supervision with RW for gait, min assist for stairs backwards with RW.   Patient's care partner is independent to provide the necessary physical assistance at discharge.  Reasons goals not met: n/a  Recommendation:  Patient will benefit from ongoing skilled PT services in outpatient setting to continue to advance safe functional mobility, address ongoing impairments in L NMR, transfers, gait, balance, endurance, strength, and minimize fall risk.  Equipment: RW, 20x18 w/c  Reasons for discharge: treatment goals met and discharge from hospital  Patient/family agrees with progress made and goals achieved: Yes  Skilled PT Treatment: Treatment 1: Pt received in w/c & agreeable to tx. Pt reports need to use restroom & ambulates in room/bathroom with RW & supervision with intermittent LLE foot drag in house slippers. Pt completes clothing management without assistance, toilet transfer with distant supervision & has continent void on toilet, performing peri hygiene without assistance. Pt performs hand hygiene standing at sink with distant supervision and from w/c level pt switches from slippers to tennis shoes without assistance & extra time. Pt ambulates room<>ortho gym with RW & decreased, safe gait speed with improved foot clearance with tennis shoes donned & some LLE ataxia. Pt completes car transfer at sedan  simulated height with supervision & cuing to sit then transfer BLE in/out vs stepping in/out of car. Pt negotiates ramp & mulch with RW & supervision and curb step with RW & very close supervision. At end of session pt left in w/c with all needs in reach. Pt voices no questions/concerns re: d/c tomorrow.  Pain: Pt denies c/o pain.   Treatment 2: Pt received in w/c & agreeable to tx. Pt denies c/o pain except for pressure she puts on R hand when holding to RW with therapist educating pt to loosen grip. Pt ambulates around bed & completes bed mobility from elevated bed to simulate her's at home with mod I. Pt ambulates room>gym>laundry room>room>dayroom>room with RW & supervision with less ataxia and more fluid step with LLE noted (improvement in heel strike). Pt negotiates 24 steps (6" + 3") with L ascending rail, R descending rail & supervision, & 2 steps (6") backwards with RW & therapist providing min assist to stabilize RW. Pt completes Berg Balance Test & scores 46/56; educated pt on interpretation & improvement in score compared to test on 9/29 (27/56), as well as current fall risk & safety recommendations. Patient demonstrates increased fall risk as noted by score of 46/56 on Berg Balance Scale.  (<36= high risk for falls, close to 100%; 37-45 significant >80%; 46-51 moderate >50%; 52-55 lower >25%). Pt retrieved clothing from washer, placing some in dryer with supervision. Provided pt with walker bag to transport delicate items back to room where pt put clothing on hangers while sitting in w/c with set up assist. Pt utilized nu-step on level 4 x 10 minutes with all four extremities with task focusing on global strengthening & coordination of reciprocal movements, as well as L NMR.  At end of session pt left sitting in w/c in room with call bell & all needs in reach.    PT Discharge Precautions/Restrictions Precautions Precautions: Fall Restrictions Weight Bearing Restrictions:  No  Vision/Perception  Pt wears glasses at all times. No changes in baseline vision. No apparent visual deficits. Perception appears WNL.  Cognition Overall Cognitive Status: Within Functional Limits for tasks assessed Arousal/Alertness: Awake/alert Orientation Level: Oriented X4 Attention: Focused;Sustained;Selective;Alternating Focused Attention: Appears intact Sustained Attention: Appears intact Selective Attention: Appears intact Alternating Attention: Appears intact Memory: Appears intact Awareness: Appears intact Problem Solving: Appears intact Executive Function: Sequencing;Organizing;Initiating;Decision Making;Self Correcting;Reasoning Reasoning: Appears intact Sequencing: Appears intact Organizing: Appears intact Decision Making: Appears intact Initiating: Appears intact Self Correcting: Appears intact Safety/Judgment: Appears intact Comments: Pt improved on safety/judgement and awareness.Pt demonstrated ability to self correction hand placement and position for safe transfers and sit <> stand. Pt no longer wearing brief and continent for urine and BM,   Sensation Sensation Light Touch: Not tested Central sensation comments: Pt reports no tingling in L UE, some in R hand d/t carpal tunnel Proprioception: Not tested Coordination Gross Motor Movements are Fluid and Coordinated: No Fine Motor Movements are Fluid and Coordinated: No Heel Shin Test: slightly impaired coordination LLE compared to RLE   Motor  Motor Motor: Abnormal postural alignment and control Motor - Skilled Clinical Observations: L hemiparesis, generalized deconditioning Motor - Discharge Observations: L hemiparesis, generalized deconditioning   Mobility Bed Mobility Bed Mobility: Rolling Right;Rolling Left;Sit to Supine;Supine to Sit(bed flat, no rails) Rolling Right: Independent with assistive device Rolling Left: Independent with assistive device Supine to Sit: Independent with assistive  device Sit to Supine: Independent with assistive device Transfers Transfers: Sit to Stand;Stand to Sit Sit to Stand: Mod I  Stand to Sit: Mod I  Transfer (Assistive device): Rolling walker   Locomotion  Gait Ambulation: Yes Gait Assistance: Supervision/Verbal cueing Gait Distance (Feet): 150 Feet Assistive device: Rolling walker Gait Gait: Yes Gait Pattern: (intermittent decreased hip/knee flexion L knee, decreased dorsiflexion & heel strike LLE, ataxia LLE) Gait velocity: decreased Stairs / Additional Locomotion Stairs: Yes Stairs Assistance: Supervision/Verbal cueing Stair Management Technique: One rail Left ascending, R descending rail Number of Stairs: 24 Height of Stairs: 6" + 3" Pt also negotiates 2 steps (6") backwards with RW & min assist for stabilizing RW. Ramp: Supervision/Verbal cueing(ambulatory with RW) Curb: Contact Guard/Touching assist(with RW) Wheelchair Mobility Wheelchair Mobility: No   Trunk/Postural Assessment  Cervical Assessment Cervical Assessment: Exceptions to WFL(slight forward head) Thoracic Assessment Thoracic Assessment: Exceptions to WFL(rounded shoulders) Postural Control Postural Control: Deficits on evaluation Righting Reactions: slightly delayed Protective Responses: slightly delayed   Balance Balance Balance Assessed: Yes Standardized Balance Assessment Standardized Balance Assessment: Berg Balance Test Berg Balance Test Sit to Stand: Able to stand without using hands and stabilize independently Standing Unsupported: Able to stand safely 2 minutes Sitting with Back Unsupported but Feet Supported on Floor or Stool: Able to sit safely and securely 2 minutes Stand to Sit: Sits safely with minimal use of hands Transfers: Able to transfer safely, minor use of hands Standing Unsupported with Eyes Closed: Able to stand 10 seconds safely Standing Ubsupported with Feet Together: Able to place feet together independently and stand for 1  minute with supervision From Standing, Reach Forward with Outstretched Arm: Can reach confidently >25 cm (10") From Standing Position, Pick up Object from Floor: Able to pick up shoe, needs supervision From Standing Position, Turn to Look Behind Over each Shoulder: Looks behind from  both sides and weight shifts well Turn 360 Degrees: Able to turn 360 degrees safely but slowly Standing Unsupported, Alternately Place Feet on Step/Stool: Able to complete 4 steps without aid or supervision(completes 8 taps with close supervision without LOB noted) Standing Unsupported, One Foot in Front: Able to plae foot ahead of the other independently and hold 30 seconds Standing on One Leg: Tries to lift leg/unable to hold 3 seconds but remains standing independently(pt elects to lift LLE & stand on RLE) Total Score: 46   Extremity Assessment  Per OT Assessment: RUE Assessment RUE Assessment: Within Functional Limits Active Range of Motion (AROM) Comments: WFL; 180 degrees General Strength Comments: 5/5 MMT LUE Assessment LUE Assessment: Exceptions to [facility] Active Range of Motion (AROM) Comments: 175 degrees General Strength Comments: brunnstrum V; 4/5 MMT Brunstrum levels for arm and hand: Hand;Arm Brunstrum level for arm: Stage V Relative Independence from Synergy Brunstrum level for hand: Stage V Independence from basic synergies  BLE AROM WFL, LLE 4+/5.   Waunita Schooner 06/05/2019, 3:31 PM

## 2019-06-05 NOTE — Progress Notes (Signed)
[facility] PHYSICAL MEDICINE & REHABILITATION PROGRESS NOTE   Subjective/Complaints: Patient seen sitting up in her chair this morning.  She states she slept well overnight.  She is eager for discharge tomorrow.  She has questions regarding handicap placard, driving, ambulating.  ROS: Denies CP, SOB, N/V/D  Objective:   No results found. No results for input(s): WBC, HGB, HCT, PLT in the last 72 hours. Recent Labs    06/05/19 0640  NA 140  K 4.1  CL 105  CO2 25  GLUCOSE 99  BUN 11  CREATININE 0.81  CALCIUM 9.3    Intake/Output Summary (Last 24 hours) at 06/05/2019 1219 Last data filed at 06/05/2019 0845 Gross per 24 hour  Intake 480 ml  Output -  Net 480 ml     Physical Exam: Vital Signs Blood pressure 122/71, pulse (!) 59, temperature 98.3 F (36.8 C), resp. rate 16, height 5' 3"  (1.6 m), weight 104.7 kg, last menstrual period 11/29/2015, SpO2 96 %. Constitutional: No distress . Vital signs reviewed. HENT: Normocephalic.  Atraumatic. Eyes: EOMI. No discharge. Cardiovascular: No JVD. Respiratory: Normal effort.  No stridor. GI: Non-distended. Skin: Warm and dry.  Intact. Psych: Flat. Musc: No edema in extremities.  No tenderness in extremities. Neurological: Alert Motor: RUE/RLE: 5/5 proximal distal LUE/LLE: 4+/5 proximal to distal  Assessment/Plan: 1. Functional deficits secondary to right  CR, PLIC infarct which require 3+ hours per day of interdisciplinary therapy in a comprehensive inpatient rehab setting.  Physiatrist is providing close team supervision and 24 hour management of active medical problems listed below.  Physiatrist and rehab team continue to assess barriers to discharge/monitor patient progress toward functional and medical goals  Care Tool:  Bathing  Bathing activity did not occur: Safety/medical concerns Body parts bathed by patient: Chest, Left arm, Right arm, Abdomen, Front perineal area, Buttocks, Right upper leg, Left upper leg,  Right lower leg, Left lower leg, Face   Body parts bathed by helper: Buttocks, Right arm     Bathing assist Assist Level: Supervision/Verbal cueing     Upper Body Dressing/Undressing Upper body dressing Upper body dressing/undressing activity did not occur (including orthotics): N/A(patient already have gown on) What is the patient wearing?: Bra, Dress    Upper body assist Assist Level: Supervision/Verbal cueing    Lower Body Dressing/Undressing Lower body dressing    Lower body dressing activity did not occur: N/A What is the patient wearing?: Pants     Lower body assist Assist for lower body dressing: Supervision/Verbal cueing     Toileting Toileting    Toileting assist Assist for toileting: Supervision/Verbal cueing     Transfers Chair/bed transfer  Transfers assist     Chair/bed transfer assist level: Supervision/Verbal cueing     Locomotion Ambulation   Ambulation assist      Assist level: Supervision/Verbal cueing Assistive device: Walker-rolling Max distance: >150 ft   Walk 10 feet activity   Assist     Assist level: Supervision/Verbal cueing Assistive device: Walker-rolling   Walk 50 feet activity   Assist    Assist level: Supervision/Verbal cueing Assistive device: Walker-rolling    Walk 150 feet activity   Assist Walk 150 feet activity did not occur: Safety/medical concerns(decreased strength/activity tolerance)  Assist level: Supervision/Verbal cueing Assistive device: Walker-rolling    Walk 10 feet on uneven surface  activity   Assist Walk 10 feet on uneven surfaces activity did not occur: Safety/medical concerns(decreased strength/activity tolerance)   Assist level: Supervision/Verbal cueing Assistive device: Chemical engineer  Assist   Type of Wheelchair: Manual    Wheelchair assist level: Supervision/Verbal cueing Max wheelchair distance: 50'(outside on unlevel surfaces)    Wheelchair 50  feet with 2 turns activity    Assist        Assist Level: Supervision/Verbal cueing   Wheelchair 150 feet activity     Assist  Wheelchair 150 feet activity did not occur: Safety/medical concerns(decreased strength/activity tolerance)       Blood pressure 122/71, pulse (!) 59, temperature 98.3 F (36.8 C), resp. rate 16, height 5' 3"  (1.6 m), weight 104.7 kg, last menstrual period 11/29/2015, SpO2 96 %.  Medical Problem List and Plan: 1.Left-sided weaknessand hemi-sensory losssecondary toright posterior corona radiata/PL IC infarction likely secondary small vessel disease. Plan 30-day event monitor as outpatient  Continue CIR, plan for discharge tomorrow  Team conference today to discuss current and goals and coordination of care, home and environmental barriers, and discharge planning with nursing, case manager, and therapies.  2. Antithrombotics: -DVT/anticoagulation:Lovenox -antiplatelet therapy: Aspirin 81 mg daily, Plavix 75 mg daily x3 weeks then aspirin alone 3. Pain Management:Tylenol as needed  -continue robaxin prn for spasms   4. Mood:Celexa 20 mg daily, Wellbutrin 300 mg daily   Stable at present -antipsychotic agents: N/A  -ambien for sleep 5. Neuropsych: This patientiscapable of making decisions on herown behalf. 6. Skin/Wound Care:Routine skin checks 7. Fluids/Electrolytes/Nutrition:Routine in and outs  BMP within normal limits on 10/6 8. Ulcerative colitis.Lialda2.4 mg twice daily  -continue probiotic  Imodium as needed-controlled with meds 9. Hyperlipidemia. Lipitor 10. History of asthma and remote tobacco abuse. Albuterol nebulizer as needed.  -continuenasonex and claritin for congestion   Continues to improve 11. ?Breast lump left, drainage  -pt will seen GYN at end of the month 12.  Acute blood loss anemia  Hemoglobin 11.5 on 9/26, labs pending 13.  Hypokalemia  Potassium 4.1 on 10/6  LOS: 11  days A FACE TO FACE EVALUATION WAS PERFORMED  Ankit Lorie Phenix 06/05/2019, 12:19 PM

## 2019-06-05 NOTE — Progress Notes (Deleted)
Carol Kosel T. [name], MD Primary Care and [place] at [facility] [name], [address] Phone: [phone]  FAX: [phone]  Carol Johnson - 53 y.o. female  MRN [id number]  Date of Birth: 05-10-1966  Visit Date: 06/06/2019  PCP: Abner Greenspan, MD  Referred by: Tower, Wynelle Fanny, MD  No chief complaint on file.  Subjective:   Carol Johnson is a 53 y.o. very pleasant female patient with There is no height or weight on file to calculate BMI. who presents with the following:  At her last office visit the patient had some left posterior heel pain that I thought was Achilles tendinopathy which is been present for about 6 months.  She did have nodule near the insertion at the calcaneus that was tender to palpation.  She also has some bilateral knee pain going up and down stairs a been present for months, again approximately 6 months  I have prescribed her some oral steroids and additionally send her to formal physical therapy.  04/09/2019 Last OV with Owens Loffler, MD Carol Johnson is a 53 y.o. very pleasant female patient with Body mass index is 41.36 kg/m. who presents with the following:   Nice lady, Body mass index is 41.36 kg/m.  She presents with left-sided posterior heel pain going on for about 6 months.  She does have a bump there and it is tender to palpation.   She also has bilateral knee pain, notably pain with going downstairs for any number of months, approximately 6 months.  She is not had any prior injuries, surgery, fractures or anything of the sort.  She does take meloxicam occasionally.  Rare Tylenol.     Past Medical History, Surgical History, Social History, Family History, Problem List, Medications, and Allergies have been reviewed and updated if relevant.  Patient Active Problem List   Diagnosis Date Noted  . Hypokalemia   . Acute blood loss anemia   . Ulcerative colitis with complication ([place])    . Small vessel cerebrovascular accident (CVA) ([place]) 05/25/2019  . CVA (cerebral vascular accident) ([place]) 05/23/2019  . Thrombocytosis ([place]) 05/23/2019  . Coronary artery disease involving native coronary artery of native heart without angina pectoris 03/21/2018  . Coronary artery calcification 02/10/2018  . Morbid obesity ([place]) 02/10/2018  . Smoker 01/15/2018  . Dyspnea on exertion 12/21/2017  . Carpal tunnel syndrome 12/21/2017  . Stress incontinence 12/21/2017  . Paresthesia of both feet 07/04/2017  . Always thirsty 07/04/2017  . Abdominal bloating 12/01/2015  . Fatigue 12/01/2015  . Vitamin B12 deficiency 12/01/2015  . Lymphocytic colitis 01/16/2015  . Anemia, iron deficiency 01/16/2015  . Anxiety state 12/20/2014  . Diarrhea 11/27/2014  . Insomnia 11/27/2014  . History of DVT (deep vein thrombosis) 09/17/2014  . OSA (obstructive sleep apnea) 09/02/2014  . Gastritis, chronic 06/06/2014  . Unspecified gastritis and gastroduodenitis without mention of hemorrhage 05/03/2014  . Abdominal pain, epigastric 04/19/2014  . Internal hemorrhoids with other complication 07/01/1116  . Routine general medical examination at a health care facility 10/14/2011  . Thyroid nodule 05/16/2011  . Rectal bleeding 11/24/2010  . Hemorrhoids, internal 11/24/2010  . Irritable bowel syndrome (IBS) 11/24/2010  . ANEMIA 10/02/2009  . Hyperlipidemia 07/18/2009  . Essential hypertension 07/18/2009  . DYSPNEA ON EXERTION 07/18/2009  . THYROID CYST 01/31/2008  . ANXIETY 01/31/2008  . Depression with anxiety 01/31/2008  . GASTRIC ULCER, HX OF 01/31/2008  . FIBROIDS, UTERUS 11/13/2007  .  ALLERGIC RHINITIS 11/13/2007  . ASTHMA 11/13/2007  . GERD 11/13/2007  . IRRITABLE BOWEL SYNDROME 11/13/2007  . INTERNAL HEMORRHOIDS 11/12/2003    Past Medical History:  Diagnosis Date  . Abdominal pain, unspecified site   . Allergic rhinitis   . Anxiety   . Asthma   . Depression   . DVT (deep venous thrombosis)  ([place]) 2016  . Fibroid uterus   . GERD (gastroesophageal reflux disease)   . Hypercholesterolemia   . Hypertension   . Internal hemorrhoid   . Irritable bowel syndrome   . Lymphocytic colitis 01/02/2015  . Pneumonia 1998  . Stroke ([place])   . Ulcerative colitis ([place])   . URI (upper respiratory infection)   . Uterine fibroid   . Vitamin B12 deficiency     Past Surgical History:  Procedure Laterality Date  . CESAREAN SECTION  2004  . CHOLECYSTECTOMY     laparoscopic "gallstones"  . ESOPHAGOGASTRODUODENOSCOPY (EGD) WITH PROPOFOL N/A 05/03/2014   Procedure: ESOPHAGOGASTRODUODENOSCOPY (EGD) WITH PROPOFOL;  Surgeon: Inda Castle, MD;  Location: [facility] ENDOSCOPY;  Service: Endoscopy;  Laterality: N/A;  . MYOMECTOMY    . UTERINE FIBROID SURGERY      Social History   Socioeconomic History  . Marital status: Married    Spouse name: Not on file  . Number of children: 1  . Years of education: Not on file  . Highest education level: Not on file  Occupational History  . Occupation: Admin. assistant  Social Needs  . Financial resource strain: Not on file  . Food insecurity    Worry: Not on file    Inability: Not on file  . Transportation needs    Medical: Not on file    Non-medical: Not on file  Tobacco Use  . Smoking status: Former Smoker    Packs/day: 0.10    Years: 10.00    Pack years: 1.00    Types: Cigarettes    Quit date: 12/07/2017    Years since quitting: 1.4  . Smokeless tobacco: Never Used  Substance and Sexual Activity  . Alcohol use: Yes    Alcohol/week: 5.0 standard drinks    Types: 5 Cans of beer per week  . Drug use: No  . Sexual activity: Yes    Birth control/protection: Pill  Lifestyle  . Physical activity    Days per week: Not on file    Minutes per session: Not on file  . Stress: Not on file  Relationships  . Social Herbalist on phone: Not on file    Gets together: Not on file    Attends religious service: Not on file    Active member of  club or organization: Not on file    Attends meetings of clubs or organizations: Not on file    Relationship status: Not on file  . Intimate partner violence    Fear of current or ex partner: Not on file    Emotionally abused: Not on file    Physically abused: Not on file    Forced sexual activity: Not on file  Other Topics Concern  . Not on file  Social History Narrative  . Not on file    Family History  Problem Relation Age of Onset  . Pulmonary fibrosis Father   . Colon cancer Paternal Aunt   . Uterine cancer Paternal Grandmother        with mets to the colon   . Heart failure Maternal Grandmother   . Heart  disease Paternal Uncle   . Heart disease Paternal Aunt   . Kidney disease Cousin   . Hyperlipidemia Mother   . Mitral valve prolapse Mother   . Heart disease Mother        arrhythmia    Allergies  Allergen Reactions  . Sertraline Hcl     REACTION: diarrhea  . Zoloft [Sertraline Hcl] Diarrhea    Medication list reviewed and updated in full in [facility].  GEN: No fevers, chills. Nontoxic. Primarily MSK c/o today. MSK: Detailed in the HPI GI: tolerating PO intake without difficulty Neuro: No numbness, parasthesias, or tingling associated. Otherwise the pertinent positives of the ROS are noted above.   Objective:   LMP 11/29/2015   ***  Radiology: CLINICAL DATA:  Knee pain  EXAM: BILATERAL KNEES STANDING - 1 VIEW  COMPARISON:  None.  FINDINGS: No acute fracture or dislocation is noted. No significant joint space narrowing is noted. No soft tissue abnormality is seen.  IMPRESSION: No acute abnormality noted.   Electronically Signed   By: Inez Catalina M.D.   On: 04/09/2019 14:07  Assessment and Plan:   ***

## 2019-06-05 NOTE — Discharge Summary (Signed)
Physician Discharge Summary  Patient ID: Carol Johnson MRN: [id number] DOB/AGE: 05-Apr-1966 53 y.o.  Admit date: 05/25/2019 Discharge date: 06/06/2019  Discharge Diagnoses:  Principal Problem:   Small vessel cerebrovascular accident (CVA) ([place]) Active Problems:   Hypokalemia   Acute blood loss anemia   Ulcerative colitis with complication (HCC)   Major depressive disorder DVT prophylaxis History of asthma and remote tobacco abuse Question left breast lump   Discharged Condition: Stable  Significant Diagnostic Studies: Dg Chest 2 View  Result Date: 05/28/2019 CLINICAL DATA:  Cough, congestion EXAM: CHEST - 2 VIEW COMPARISON:  05/23/2019 FINDINGS: The heart size and mediastinal contours are within normal limits. Both lungs are clear. The visualized skeletal structures are unremarkable. IMPRESSION: No active cardiopulmonary disease. Electronically Signed   By: Davina Poke M.D.   On: 05/28/2019 12:34   Ct Head Wo Contrast  Result Date: 05/23/2019 CLINICAL DATA:  Left facial weakness EXAM: CT HEAD WITHOUT CONTRAST TECHNIQUE: Contiguous axial images were obtained from the base of the skull through the vertex without intravenous contrast. COMPARISON:  None. FINDINGS: Brain: Ventricles are normal in size and configuration. There is a degree of invagination of CSF into the sella. There is no intracranial mass, hemorrhage, extra-axial fluid collection, or midline shift. Brain parenchyma appears unremarkable. No acute infarct. Vascular: No hyperdense vessels. No appreciable vascular calcification. Skull: Bony calvarium appears intact. Sinuses/Orbits: There is mild mucosal thickening involving several ethmoid air cells. Other paranasal sinuses are clear. Orbits appear symmetric bilaterally. Other: Mastoid air cells are clear. IMPRESSION: 1. Invagination of CSF into the sella, a finding of doubtful clinical significance. Ventricles normal in size and configuration. 2.  Brain parenchyma appears  unremarkable.  No mass or hemorrhage. 3.  Mild mucosal thickening in several ethmoid air cells. Electronically Signed   By: Lowella Grip III M.D.   On: 05/23/2019 08:56   Mr Angio Head Wo Contrast  Result Date: 05/23/2019 CLINICAL DATA:  Cerebral aneurysm, subarachnoid hemorrhage, cerebral vasospasm, evaluate left-sided weakness, left facial droop. EXAM: MRI HEAD WITHOUT CONTRAST MRA HEAD WITHOUT CONTRAST TECHNIQUE: Multiplanar, multiecho pulse sequences of the brain and surrounding structures were obtained without intravenous contrast. Angiographic images of the head were obtained using MRA technique without contrast. COMPARISON:  Noncontrast head CT 05/23/2019 FINDINGS: MRI HEAD FINDINGS Brain: Multiple sequences are motion degraded. Acute infarct measuring 2.1 x 0.7 cm involving the right corona radiata/internal capsule and extending inferiorly into the right basal ganglia. Associated T2/FLAIR hyperintensity at this site. Mild scattered T2/FLAIR hyperintensity within the cerebral white matter is nonspecific, but consistent chronic small vessel ischemic disease. No evidence of intracranial mass. No midline shift or extra-axial fluid collection. No chronic intracranial blood products. Cerebral volume is normal for age. Partially empty sella turcica. Vascular: Reported separately Skull and upper cervical spine: Normal marrow signal. Sinuses/Orbits: Open orbits visualized paranasal sinuses are clear. Visualized mastoid air cells are clear. MRA HEAD FINDINGS The examination is motion degraded. Bilateral ICA siphons patent without high-grade stenosis. Right middle and anterior cerebral arteries patent without high-grade proximal stenosis. Left middle and anterior cerebral arteries patent without high-grade proximal stenosis. Possible 2 mm aneurysm arising from the A2 right anterior cerebral artery on source images (series 3, image 61). However, evaluation is somewhat limited due to motion artifact. Visualized  intracranial vertebral arteries are patent without high-grade stenosis. Basilar artery is patent without high-grade stenosis. Bilateral posterior cerebral arteries patent without high-grade proximal stenosis. A right posterior communicating artery is present. Left posterior communicating is poorly delineated and  may be hypoplastic or absent. These results were called by telephone at the time of interpretation on 05/23/2019 at 3:09 pm to provider Dr. Tyrone Nine, Who verbally acknowledged these results. IMPRESSION: MRI brain: 1. Motion degraded examination. 2. 2.1 x 0.7 cm acute infarct involving right corona radiata/internal capsule and extending into the right basal ganglia. 3. Mild chronic small vessel ischemic disease. MRA head: 1. Motion degraded examination. 2. No evidence of intracranial large vessel occlusion or high-grade proximal arterial stenosis. 3. A 2 mm aneurysm arising the A2 right anterior cerebral artery is questioned, although evaluation is somewhat limited due to motion degradation. Consider follow-up CT or MR angiography. Electronically Signed   By: Kellie Simmering   On: 05/23/2019 15:10   Mr Angio Neck W Wo Contrast  Result Date: 05/23/2019 CLINICAL DATA:  Initial evaluation for acute stroke. EXAM: MRA NECK WITHOUT AND WITH CONTRAST TECHNIQUE: Multiplanar and multiecho pulse sequences of the neck were obtained without and with intravenous contrast. Angiographic images of the neck were obtained using MRA technique without and with intravenous contrast. CONTRAST:  43m GADAVIST GADOBUTROL 1 MMOL/ML IV SOLN COMPARISON:  Prior MRI and brain MRA from earlier the same day. FINDINGS: Examination mildly limited by motion artifact. Source images reviewed. AORTIC ARCH: Visualized aortic arch of normal caliber with normal branch pattern. No hemodynamically significant stenosis seen about the origin of the great vessels. Visualized subclavian arteries widely patent. RIGHT CAROTID SYSTEM: Evaluation of the  proximal right CCA mildly limited by motion. Visualized right CCA patent to the bifurcation without stenosis. No significant atheromatous narrowing seen about the right bifurcation. Right ICA mildly tortuous but widely patent distally to the circle-of-Willis without stenosis or occlusion. LEFT CAROTID SYSTEM: Left CCA patent to the bifurcation without stenosis. Evaluation of the left bifurcation mildly limited by motion, with no definite significant stenosis seen. Left ICA mildly tortuous but widely patent distally to the circle-of-Willis without stenosis or occlusion. VERTEBRAL ARTERIES: Both vertebral arteries arise from the subclavian arteries. Vertebral arteries widely patent within the neck without stenosis or vascular occlusion. IMPRESSION: Negative MRA of the neck. No hemodynamically significant or critical flow limiting stenosis. Electronically Signed   By: BJeannine BogaM.D.   On: 05/23/2019 20:11   Mr Brain Wo Contrast (neuro Protocol)  Result Date: 05/23/2019 CLINICAL DATA:  Cerebral aneurysm, subarachnoid hemorrhage, cerebral vasospasm, evaluate left-sided weakness, left facial droop. EXAM: MRI HEAD WITHOUT CONTRAST MRA HEAD WITHOUT CONTRAST TECHNIQUE: Multiplanar, multiecho pulse sequences of the brain and surrounding structures were obtained without intravenous contrast. Angiographic images of the head were obtained using MRA technique without contrast. COMPARISON:  Noncontrast head CT 05/23/2019 FINDINGS: MRI HEAD FINDINGS Brain: Multiple sequences are motion degraded. Acute infarct measuring 2.1 x 0.7 cm involving the right corona radiata/internal capsule and extending inferiorly into the right basal ganglia. Associated T2/FLAIR hyperintensity at this site. Mild scattered T2/FLAIR hyperintensity within the cerebral white matter is nonspecific, but consistent chronic small vessel ischemic disease. No evidence of intracranial mass. No midline shift or extra-axial fluid collection. No chronic  intracranial blood products. Cerebral volume is normal for age. Partially empty sella turcica. Vascular: Reported separately Skull and upper cervical spine: Normal marrow signal. Sinuses/Orbits: Open orbits visualized paranasal sinuses are clear. Visualized mastoid air cells are clear. MRA HEAD FINDINGS The examination is motion degraded. Bilateral ICA siphons patent without high-grade stenosis. Right middle and anterior cerebral arteries patent without high-grade proximal stenosis. Left middle and anterior cerebral arteries patent without high-grade proximal stenosis. Possible 2 mm aneurysm arising  from the A2 right anterior cerebral artery on source images (series 3, image 61). However, evaluation is somewhat limited due to motion artifact. Visualized intracranial vertebral arteries are patent without high-grade stenosis. Basilar artery is patent without high-grade stenosis. Bilateral posterior cerebral arteries patent without high-grade proximal stenosis. A right posterior communicating artery is present. Left posterior communicating is poorly delineated and may be hypoplastic or absent. These results were called by telephone at the time of interpretation on 05/23/2019 at 3:09 pm to provider Dr. Tyrone Nine, Who verbally acknowledged these results. IMPRESSION: MRI brain: 1. Motion degraded examination. 2. 2.1 x 0.7 cm acute infarct involving right corona radiata/internal capsule and extending into the right basal ganglia. 3. Mild chronic small vessel ischemic disease. MRA head: 1. Motion degraded examination. 2. No evidence of intracranial large vessel occlusion or high-grade proximal arterial stenosis. 3. A 2 mm aneurysm arising the A2 right anterior cerebral artery is questioned, although evaluation is somewhat limited due to motion degradation. Consider follow-up CT or MR angiography. Electronically Signed   By: Kellie Simmering   On: 05/23/2019 15:10   Dg Chest Port 1 View  Result Date: 05/23/2019 CLINICAL DATA:   Hemiparesis, shortness of breath EXAM: PORTABLE CHEST 1 VIEW COMPARISON:  None. FINDINGS: The heart size and mediastinal contours are within normal limits. Both lungs are clear. The visualized skeletal structures are unremarkable. IMPRESSION: No active disease. Electronically Signed   By: Zetta Bills M.D.   On: 05/23/2019 16:49    Labs:  Basic Metabolic Panel: Recent Labs  Lab 06/05/19 0640  NA 140  K 4.1  CL 105  CO2 25  GLUCOSE 99  BUN 11  CREATININE 0.81  CALCIUM 9.3    CBC: No results for input(s): WBC, NEUTROABS, HGB, HCT, MCV, PLT in the last 168 hours.  CBG: No results for input(s): GLUCAP in the last 168 hours.  Family history.  Father with pulmonary fibrosis.  Paternal aunt with colon cancer.  Paternal grandmother with uterine cancer.  Maternal grandmother with heart failure.  Mother with hyperlipidemia.  Denies diabetes mellitus  Brief HPI:   Carol Johnson is a 53 y.o. right-handed female with history of hypertension, ulcerative colitis, DVT 2016 left posterior tibial vein contributed to estrogen use and smoking she was on Xarelto which has since been completed, hyperlipidemia, asthma, she quit smoking 17 months ago.  Per chart review independent prior to admission.  She lives with family in good support.  Presented 05/23/2019 with left-sided weakness.  Cranial CT scan negative.  MRI showed a 2 cm acute infarction right corona radiata internal capsule extending into the right basal ganglia.  MRA of the head with no LVO but concerning for possible 2 mm aneurysm from right A2 division of the anterior cerebral artery.  MRA of the neck unremarkable for any acute occlusion.  Echocardiogram with ejection fraction 65%.  Neurology consulted placed on aspirin and Plavix x3 weeks then aspirin alone.  Subcutaneous Lovenox for DVT prophylaxis.  Recommendations made for 30-day cardiac event monitor as outpatient.  Tolerating a regular diet.  Patient was admitted for a comprehensive rehab  program.   Hospital Course: TYMEKA PRIVETTE was admitted to rehab 05/25/2019 for inpatient therapies to consist of PT, ST and OT at least three hours five days a week. Past admission physiatrist, therapy team and rehab RN have worked together to provide customized collaborative inpatient rehab.  Pertaining to patient right posterior corona radiata PL IC infarction likely secondary to small vessel disease.  Arrangements  made for 30-day cardiac event monitor as outpatient.  Aspirin and Plavix x3 weeks then aspirin alone patient would follow neurology services.  Subcutaneous Lovenox for DVT prophylaxis no bleeding episodes.  Mood stabilization with the use of Celexa as well as Wellbutrin.  She did have a history of ulcerative colitis remained on lialda twice daily as well as using Imodium as needed.  Lipitor for hyperlipidemia.  She did have a history of asthma remote tobacco abuse oxygen saturations greater than 90% on room air.  Noted left breast lump small amount of drainage arrangements made for outpatient follow-up GYN for ongoing evaluation.   Blood pressures were monitored on TID basis and remained stable   She is continent of bowel and bladder.  She has made gains during rehab stay and is attending therapies  She will continue to receive follow up therapies   after discharge  Rehab course: During patient's stay in rehab weekly team conferences were held to monitor patient's progress, set goals and discuss barriers to discharge. At admission, patient required minimal guard ambulate 25 feet with rolling walker, minimal assist sit to stand, minimal assist supine to sit.  Minimal guard grooming, moderate assist upper body bathing, max is lower body bathing, moderate assist upper body dressing, max assist lower body dressing, minimal assist toilet transfers  Physical exam.  Blood pressure 132/77 pulse 91 temperature 97.7 respirations 18 oxygen saturations 95% room air Constitutional.  Alert and  oriented to person place and time.  Well-nourished HEENT Head.  Normocephalic and atraumatic Eyes.  Pupils round and reactive to light no discharge without nystagmus Neck.  Supple nontender normal range of motion no thyromegaly without JVD Cardiovascular normal rate and rhythm exam reveals no friction rub or murmur heard Respiratory.  Effort normal no respiratory distress without wheeze or rails GI.  Soft nontender without rebound positive bowel sounds Musculoskeletal normal range of motion no edema Neurological.  She is alert and oriented cooperative with exam normal insight and awareness.  Functional memory and language intact.  Left central 7 and left facial sensory loss.  Right upper extremity and right lower extremity 5 out of 5.  Left upper extremity 4 out of 5 proximal to 4- out of 5 distally.  Left lower extremity 3+ to 4 out of 5 hip flexors, knee extension, APF, ADF 3 out of 5.  DTRs 3+ on the left.  She  has had improvement in activity tolerance, balance, postural control as well as ability to compensate for deficits. She has had improvement in functional use RUE/LUE  and RLE/LLE as well as improvement in awareness.  Working with energy conservation techniques.  Patient ambulates around the unit with rolling walker and supervision with decreased speed.  Completes car transfers with supervision and cueing to sit and transfer bilateral lower extremities in and out of car.  Negotiates ramps and mulch with rolling walker and supervision and curb step with rolling walker and contact-guard assist.  Negotiates 2 steps backwards with rolling walker minimal assist with therapist.  Completes bed mobility in room with bed elevated to simulate her bed at home, bed flat and no rails with supervision overall.  She gather his belongings for activities the living and homemaking dressing grooming and hygiene.  Full family teaching was completed it was advised no driving and she was discharged to home        Disposition: Discharge disposition: 01-Home or Self Care     Discharge to home   Diet: Regular  Special Instructions: No  driving smoking or alcohol  Follow-up GYN in regards to left breast lump for further evaluation  Continue aspirin and Plavix x3 weeks then aspirin alone  Medications at discharge 1.  Tylenol as needed 2.  Aspirin 81 mg p.o. daily 3.  Lipitor 80 mg p.o. daily 4.  Wellbutrin XL 300 mg p.o. daily 5.  Celexa 20 mg p.o. daily 6.  Plavix 75 mg p.o. daily 7.  Imodium as needed 8.  Claritin 10 mg daily 9.Lialda 2.4 mg twice daily 10.  Robaxin 500 mg every 6 hours as needed muscle spasms 11.  Protonix 40 mg p.o. daily 12.  Florastor 250 mg p.o. twice daily 13.  Ambien as needed  Discharge Instructions    Ambulatory referral to Neurology   Complete by: As directed    An appointment is requested in approximately 4 weeks right posterior corona radiata/PLIC infarction   Ambulatory referral to Physical Medicine Rehab   Complete by: As directed    Moderate complexity follow-up 1 to 2 weeks right posterior corona radiata PL IC infarction     Allergies as of 06/06/2019      Reactions   Sertraline Hcl    REACTION: diarrhea   Zoloft [sertraline Hcl] Diarrhea      Medication List    STOP taking these medications   meloxicam 15 MG tablet Commonly known as: MOBIC     TAKE these medications   acetaminophen 325 MG tablet Commonly known as: TYLENOL Take 2 tablets (650 mg total) by mouth every 4 (four) hours as needed for mild pain (or temp > 37.5 C (99.5 F)).   albuterol 108 (90 Base) MCG/ACT inhaler Commonly known as: VENTOLIN HFA Inhale 2 puffs into the lungs every 4 (four) hours as needed for wheezing or shortness of breath (cough, shortness of breath or wheezing.).   aspirin 81 MG EC tablet Take 1 tablet (81 mg total) by mouth daily.   atorvastatin 80 MG tablet Commonly known as: LIPITOR Take 1 tablet (80 mg total) by mouth daily at 6 PM.    buPROPion 300 MG 24 hr tablet Commonly known as: WELLBUTRIN XL Take 1 tablet (300 mg total) by mouth daily.   citalopram 20 MG tablet Commonly known as: CELEXA Take 1 tablet (20 mg total) by mouth daily.   clopidogrel 75 MG tablet Commonly known as: PLAVIX Take 1 tablet (75 mg total) by mouth daily.   fexofenadine 180 MG tablet Commonly known as: ALLEGRA Take 180 mg by mouth daily as needed.   fluticasone 50 MCG/ACT nasal spray Commonly known as: FLONASE Place 2 sprays into both nostrils daily.   loperamide 2 MG capsule Commonly known as: IMODIUM Take 1 capsule (2 mg total) by mouth as needed for diarrhea or loose stools.   mesalamine 1.2 g EC tablet Commonly known as: LIALDA TAKE 2 TABLETS (2.4 G TOTAL) BY MOUTH 2 (TWO) TIMES DAILY.   methocarbamol 500 MG tablet Commonly known as: ROBAXIN Take 1 tablet (500 mg total) by mouth every 6 (six) hours as needed for muscle spasms.   pantoprazole 40 MG tablet Commonly known as: PROTONIX Take 1 tablet (40 mg total) by mouth daily.   saccharomyces boulardii 250 MG capsule Commonly known as: FLORASTOR Take 1 capsule (250 mg total) by mouth 2 (two) times daily.   zolpidem 12.5 MG CR tablet Commonly known as: AMBIEN CR TAKE 1 TABLET (12.5 MG TOTAL) BY MOUTH AT BEDTIME AS NEEDED FOR SLEEP What changed:   how much to take  reasons to take this  additional instructions      Follow-up Information    Meredith Staggers, MD Follow up.   Specialty: Physical Medicine and Rehabilitation Why: Office to call for appointment Contact information: [place] [phone] [phone]        [facility] Office Follow up.   Specialty: Cardiology Why: You will be called to arrange for a 30 day cardiac monitor. Please answer your phone for unknown numbers. Our office number starts with [phone].  Contact information: [address], [place]  [place]          Signed: Cathlyn Parsons 06/06/2019, 5:23 AM

## 2019-06-05 NOTE — Progress Notes (Signed)
Occupational Therapy Discharge Summary  Patient Details  Name: Carol Johnson MRN: [id number] Date of Birth: 05-09-1966  Today's Date: 06/05/2019 OT Individual Time: 0845-1000 OT Individual Time Calculation (min): 75 min    Patient has met 13 of 13 long term goals due to improved activity tolerance, improved balance, postural control, ability to compensate for deficits, functional use of  LEFT upper and LEFT lower extremity, improved attention, improved awareness and improved coordination.  Patient to discharge at overall Supervision level.  Patient's care partner is independent and can provide the necessary physical and cognitive assistance at discharge.    Reasons goals not met: N/A  Recommendation:  Patient will benefit from ongoing skilled OT services in outpatient setting to continue to advance functional skills in the area of BADL and iADL.  Equipment: BSC  Reasons for discharge: treatment goals met  Patient/family agrees with progress made and goals achieved: Yes   Skilled OT intervention Pt received in w/c and ready for tx with OT. Pt reporting needing to have a BM; ambulates with RW and voided continent BM. Pt completes toileting with (S). Pt completed UB and LB bathing with (S) and use of long handled sponge to wash buttocks and peri area. Pt facilitates use of L UE and hand during shower to wash hair and wash R side of body. Pt completed UB and LB dressing with (S) and completes oral care while standing at sink. Pt requested to apply makeup; completes task sitting EOB with mod I for increased time. Pt educated on how to transport w/c in the car and safety of w/c management in the community; anti-tippers purpose and importance of locking breaks. Pt verbalized understanding. Pt completes functional mobility approx 75' to complete laundry; pt completes with (S). MMT completed and scored 4/5 L UE and 5/5 R UE. Pt continues to have functional use of L UE and hand and is at a brunstrum V.  Pt reports she has no concerns for d/c as she has support from son and other family members.   OT Discharge Precautions/Restrictions  Precautions Precautions: Fall Precaution Comments: L LE weakness Restrictions Weight Bearing Restrictions: No   Pain Pain Assessment Pain Scale: 0-10 Pain Score: 0-No pain ADL ADL Equipment Provided: Long-handled sponge Eating: Modified independent(increaed time for B UE tasks) Where Assessed-Eating: Wheelchair Grooming: Supervision/safety Where Assessed-Grooming: Standing at sink Upper Body Bathing: Supervision/safety Where Assessed-Upper Body Bathing: Shower Lower Body Bathing: Supervision/safety Where Assessed-Lower Body Bathing: Shower Upper Body Dressing: Supervision/safety Where Assessed-Upper Body Dressing: Other (Comment)(Bariatric BSC) Lower Body Dressing: Supervision/safety Where Assessed-Lower Body Dressing: Other (Comment)(Bariatric BSC) Toileting: Supervision/safety Where Assessed-Toileting: Glass blower/designer: Close supervision Armed forces technical officer Method: Human resources officer: Close supervison Clinical cytogeneticist Method: Optometrist: Facilities manager: Close supervision Social research officer, government Method: Heritage manager: Printmaker TTB) Vision Baseline Vision/History: Wears glasses Wears Glasses: At all times Patient Visual Report: No change from baseline Vision Assessment?: No apparent visual deficits Perception  Perception: Within Functional Limits Praxis Praxis: Intact Cognition Overall Cognitive Status: Within Functional Limits for tasks assessed Arousal/Alertness: Awake/alert Orientation Level: Oriented X4 Attention: Focused;Sustained;Selective;Alternating Focused Attention: Appears intact Sustained Attention: Appears intact Selective Attention: Appears intact Alternating Attention: Appears intact Memory: Appears  intact Awareness: Appears intact Problem Solving: Appears intact Executive Function: Sequencing;Organizing;Initiating;Decision Making;Self Correcting;Reasoning Reasoning: Appears intact Sequencing: Appears intact Organizing: Appears intact Decision Making: Appears intact Initiating: Appears intact Self Correcting: Appears intact Safety/Judgment: Appears intact Comments: Pt improved on safety/judgement and awareness.Pt demonstrated ability  to self correction hand placement and position for safe transfers and sit <> stand. Pt no longer wearing brief and continent for urine and BM, Sensation Sensation Light Touch: Impaired Detail Central sensation comments: Pt reports no tingling in L UE, some in R hand d/t carpal tunnel Hot/Cold: Appears Intact Proprioception: Appears Intact Stereognosis: Not tested Coordination Gross Motor Movements are Fluid and Coordinated: No Fine Motor Movements are Fluid and Coordinated: No Coordination and Movement Description: Distal weakness noted in L UE and LE. Integrated L hand into ADL tasks and demonstrated ability to perform tasks with pincer and finger to palm translation with increased time. Motor  Motor Motor: Hemiplegia Motor - Skilled Clinical Observations: left hemiparesis Mobility  Bed Mobility Bed Mobility: Not assessed Rolling Right: Independent with assistive device Rolling Left: Independent with assistive device Supine to Sit: Independent with assistive device Sit to Supine: Independent with assistive device Transfers Sit to Stand: Supervision/Verbal cueing Stand to Sit: Supervision/Verbal cueing  Trunk/Postural Assessment  Cervical Assessment Cervical Assessment: Exceptions to WFL(Head tilted slightly forward while sitting) Thoracic Assessment Thoracic Assessment: Exceptions to WFL(rounded shoulders in sitting) Lumbar Assessment Lumbar Assessment: Exceptions to WFL(posterior pelvic tilt) Postural Control Postural Control: Within  Functional Limits  Balance Balance Balance Assessed: Yes Static Sitting Balance Static Sitting - Balance Support: Feet supported;Feet unsupported Static Sitting - Level of Assistance: 6: Modified independent (Device/Increase time) Dynamic Sitting Balance Dynamic Sitting - Balance Support: During functional activity Dynamic Sitting - Level of Assistance: 6: Modified independent (Device/Increase time) Dynamic Sitting - Balance Activities: Lateral lean/weight shifting;Forward lean/weight shifting;Reaching across midline Static Standing Balance Static Standing - Balance Support: During functional activity Static Standing - Level of Assistance: 5: Stand by assistance Dynamic Standing Balance Dynamic Standing - Balance Support: Left upper extremity supported;During functional activity Dynamic Standing - Level of Assistance: 5: Stand by assistance Dynamic Standing - Balance Activities: Reaching across midline Extremity/Trunk Assessment RUE Assessment RUE Assessment: Within Functional Limits Active Range of Motion (AROM) Comments: WFL; 180 degrees General Strength Comments: 5/5 MMT LUE Assessment LUE Assessment: Exceptions to [facility] Active Range of Motion (AROM) Comments: 175 degrees General Strength Comments: brunnstrum V; 4/5 MMT Brunstrum levels for arm and hand: Hand;Arm Brunstrum level for arm: Stage V Relative Independence from Synergy Brunstrum level for hand: Stage V Independence from basic synergies   Gurtaj Ruz 06/05/2019, 10:24 AM

## 2019-06-06 ENCOUNTER — Ambulatory Visit: Payer: BC Managed Care – PPO | Admitting: Family Medicine

## 2019-06-06 ENCOUNTER — Telehealth: Payer: Self-pay | Admitting: Family Medicine

## 2019-06-06 NOTE — Discharge Instructions (Signed)
Inpatient Rehab Discharge Instructions  Carol Johnson Discharge date and time: No discharge date for patient encounter.   Activities/Precautions/ Functional Status: Activity: activity as tolerated Diet: regular diet Wound Care: none needed Functional status:  ___ No restrictions     ___ Walk up steps independently ___ 24/7 supervision/assistance   ___ Walk up steps with assistance ___ Intermittent supervision/assistance  ___ Bathe/dress independently ___ Walk with walker     _x__ Bathe/dress with assistance ___ Walk Independently    ___ Shower independently ___ Walk with assistance    ___ Shower with assistance ___ No alcohol     ___ Return to work/school ________     COMMUNITY REFERRALS UPON DISCHARGE:    Outpatient: PT     OT                   Agency:  [facility] Phone: [phone]                Appointment Date/Time:  10/12 @ 11:00 am  (come in through the "medical mall" entrance and they will take you to the rehab clinic)  Medical Equipment/Items Ordered: wheelchair, walker, commode                                                      Agency/Supplier: [facility] @ [phone]      Special Instructions: No driving smoking or alcohol  Aspirin and Plavix x3 weeks then aspirin alone  Follow-up GYN for left breast mass and further evaluation  STROKE/TIA DISCHARGE INSTRUCTIONS SMOKING Cigarette smoking nearly doubles your risk of having a stroke & is the single most alterable risk factor  If you smoke or have smoked in the last 12 months, you are advised to quit smoking for your health.  Most of the excess cardiovascular risk related to smoking disappears within a year of stopping.  Ask you doctor about anti-smoking medications  [place] Quit Line: 1-800-QUIT NOW  Free Smoking Cessation Classes [phone]  CHOLESTEROL Know your levels; limit fat & cholesterol in your diet  Lipid Panel     Component Value Date/Time   CHOL 228 (H) 05/24/2019 0405   TRIG 151 (H) 05/24/2019 0405   HDL 32 (L) 05/24/2019 0405   CHOLHDL 7.1 05/24/2019 0405   VLDL 30 05/24/2019 0405   LDLCALC 166 (H) 05/24/2019 0405      Many patients benefit from treatment even if their cholesterol is at goal.  Goal: Total Cholesterol (CHOL) less than 160  Goal:  Triglycerides (TRIG) less than 150  Goal:  HDL greater than 40  Goal:  LDL (LDLCALC) less than 100   BLOOD PRESSURE American Stroke Association blood pressure target is less that 120/80 mm/Hg  Your discharge blood pressure is:  BP: 131/70  Monitor your blood pressure  Limit your salt and alcohol intake  Many individuals will require more than one medication for high blood pressure  DIABETES (A1c is a blood sugar average for last 3 months) Goal HGBA1c is under 7% (HBGA1c is blood sugar average for last 3 months)  Diabetes: No known diagnosis of diabetes    Lab Results  Component Value Date   HGBA1C 6.2 (H) 05/24/2019     Your HGBA1c can be lowered with medications, healthy diet, and exercise.  Check your blood sugar as directed by your  physician  Call your physician if you experience unexplained or low blood sugars.  PHYSICAL ACTIVITY/REHABILITATION Goal is 30 minutes at least 4 days per week  Activity: Increase activity slowly, Therapies: Physical Therapy: Home Health Return to work:   Activity decreases your risk of heart attack and stroke and makes your heart stronger.  It helps control your weight and blood pressure; helps you relax and can improve your mood.  Participate in a regular exercise program.  Talk with your doctor about the best form of exercise for you (dancing, walking, swimming, cycling).  DIET/WEIGHT Goal is to maintain a healthy weight  Your discharge diet is:  Diet Order            Diet Heart Room service appropriate? Yes; Fluid consistency: Thin  Diet effective now              liquids Your height is:  Height: 5' 3"  (160 cm) Your current weight is: Weight:  105.1 kg Your Body Mass Index (BMI) is:  BMI (Calculated): 41.05  Following the type of diet specifically designed for you will help prevent another stroke.  Your goal weight range is:    Your goal Body Mass Index (BMI) is 19-24.  Healthy food habits can help reduce 3 risk factors for stroke:  High cholesterol, hypertension, and excess weight.  RESOURCES Stroke/Support Group:  Call [phone]   STROKE EDUCATION PROVIDED/REVIEWED AND GIVEN TO PATIENT Stroke warning signs and symptoms How to activate emergency medical system (call 911). Medications prescribed at discharge. Need for follow-up after discharge. Personal risk factors for stroke. Pneumonia vaccine given:  Flu vaccine given:  My questions have been answered, the writing is legible, and I understand these instructions.  I will adhere to these goals & educational materials that have been provided to me after my discharge from the hospital.      My questions have been answered and I understand these instructions. I will adhere to these goals and the provided educational materials after my discharge from the hospital.  Patient/Caregiver Signature _______________________________ Date __________  Clinician Signature _______________________________________ Date __________  Please bring this form and your medication list with you to all your follow-up doctor's appointments.

## 2019-06-06 NOTE — Patient Care Conference (Signed)
Inpatient RehabilitationTeam Conference and Plan of Care Update Date: 06/05/2019   Time: 10:25 AM    Patient Name: Carol Johnson      Medical Record Number: [id number]  Date of Birth: Jun 28, 1966 Sex: Female         Room/Bed: 4W03C/4W03C-01 Payor Info: Payor: [place] / Plan: BCBS COMM PPO / Product Type: *No Product type* /    Admit Date/Time:  05/25/2019  6:58 PM  Primary Diagnosis:  Small vessel cerebrovascular accident (CVA) [facility])  Patient Active Problem List   Diagnosis Date Noted  . Major depressive disorder   . Hypokalemia   . Acute blood loss anemia   . Ulcerative colitis with complication ([place])   . Small vessel cerebrovascular accident (CVA) ([place]) 05/25/2019  . CVA (cerebral vascular accident) ([place]) 05/23/2019  . Thrombocytosis ([place]) 05/23/2019  . Coronary artery disease involving native coronary artery of native heart without angina pectoris 03/21/2018  . Coronary artery calcification 02/10/2018  . Morbid obesity ([place]) 02/10/2018  . Smoker 01/15/2018  . Dyspnea on exertion 12/21/2017  . Carpal tunnel syndrome 12/21/2017  . Stress incontinence 12/21/2017  . Paresthesia of both feet 07/04/2017  . Always thirsty 07/04/2017  . Abdominal bloating 12/01/2015  . Fatigue 12/01/2015  . Vitamin B12 deficiency 12/01/2015  . Lymphocytic colitis 01/16/2015  . Anemia, iron deficiency 01/16/2015  . Anxiety state 12/20/2014  . Diarrhea 11/27/2014  . Insomnia 11/27/2014  . History of DVT (deep vein thrombosis) 09/17/2014  . OSA (obstructive sleep apnea) 09/02/2014  . Gastritis, chronic 06/06/2014  . Unspecified gastritis and gastroduodenitis without mention of hemorrhage 05/03/2014  . Abdominal pain, epigastric 04/19/2014  . Internal hemorrhoids with other complication 15/17/6160  . Routine general medical examination at a health care facility 10/14/2011  . Thyroid nodule 05/16/2011  . Rectal bleeding 11/24/2010  . Hemorrhoids, internal 11/24/2010  . Irritable  bowel syndrome (IBS) 11/24/2010  . ANEMIA 10/02/2009  . Hyperlipidemia 07/18/2009  . Essential hypertension 07/18/2009  . DYSPNEA ON EXERTION 07/18/2009  . THYROID CYST 01/31/2008  . ANXIETY 01/31/2008  . Depression with anxiety 01/31/2008  . GASTRIC ULCER, HX OF 01/31/2008  . FIBROIDS, UTERUS 11/13/2007  . ALLERGIC RHINITIS 11/13/2007  . ASTHMA 11/13/2007  . GERD 11/13/2007  . IRRITABLE BOWEL SYNDROME 11/13/2007  . INTERNAL HEMORRHOIDS 11/12/2003    Expected Discharge Date: Expected Discharge Date: 06/06/19  Team Members Present: Physician leading conference: Dr. Courtney Heys Social Worker Present: Lennart Pall, LCSW Nurse Present: Blair Heys, RN PT Present: Lavone Nian, PT OT Present: Laverle Hobby, OT SLP Present: Weston Anna, SLP PPS Coordinator present : Gunnar Fusi, SLP     Current Status/Progress Goal Weekly Team Focus  Bowel/Bladder   cont B&B, LBM 10/5  pt independent with B&B  assess and assist prn   Swallow/Nutrition/ Hydration             ADL's   (S) transfers and (S) ADLs UB and LB. [place] V LUE  Supervision ADLs and transfers  LUE NMR, ADL transfers, IADLs   Mobility   supervision overall with RW, except min assist for stairs without rails with RW  supervision overall with LRAD  L NMR, strengthening, balance, gait, stair negotiation, transfers, bed mobility, d/c planning, family ed   Communication             Safety/Cognition/ Behavioral Observations            Pain   pt has occasional neck pain rated 4-7/10, tonight complained of pain in  left breast relieved by tylenol prn  pain remain controlled with tylenol prn  assess pain qshift and prn   Skin   skin intact  pt reposition self to keep skin intact and prevent pressure injuries  assess skin qshift      *See Care Plan and progress notes for long and short-term goals.     Barriers to Discharge  Current Status/Progress Possible Resolutions Date Resolved   Nursing                   PT                    OT                  SLP                SW                Discharge Planning/Teaching Needs:  Pt to d/c to sister's home and her 66 yr old son there as well.  Teaching completed   Team Discussion:  No new medical issues;  Cont bb.  Supervision with ADLs and mobility and improving function in arm! Ready for d/c tomorrow  Revisions to Treatment Plan:  NA    Medical Summary Current Status: supervision for PT/OT- family education has been done- d/c tomorrow Weekly Focus/Goal: d/c tomorrow  Barriers to Discharge: Behavior;Decreased family/caregiver support;Medication compliance;Weight  Barriers to Discharge Comments: n/a Possible Resolutions to Barriers: ready for d/c   Continued Need for Acute Rehabilitation Level of Care: The patient requires daily medical management by a physician with specialized training in physical medicine and rehabilitation for the following reasons: Direction of a multidisciplinary physical rehabilitation program to maximize functional independence : Yes Medical management of patient stability for increased activity during participation in an intensive rehabilitation regime.: Yes Analysis of laboratory values and/or radiology reports with any subsequent need for medication adjustment and/or medical intervention. : Yes   I attest that I was present, lead the team conference, and concur with the assessment and plan of the team.   Giulio Bertino 06/06/2019, 8:16 AM   Team conference was held via web/ teleconference due to [place] - 19

## 2019-06-06 NOTE — Progress Notes (Signed)
Pt. Got d/c instructions and equipment,pt. Is ready to go home with her family.

## 2019-06-06 NOTE — Progress Notes (Signed)
Social Work Discharge Note   The overall goal for the admission was met for:   Discharge location: Yes - home with sister  Length of Stay: Yes - 12 days  Discharge activity level: Yes - supervision  Home/community participation: Yes  Services provided included: MD, RD, PT, OT, SLP, RN, TR, Pharmacy, Neuropsych and SW  Financial Services: Private Insurance: [place]  Follow-up services arranged: Outpatient: PT, OT via [facility], DME: 20x16 lightweight w/c, cushion, rolling walker and 3n1 commode via [place] and Patient/Family has no preference for HH/DME agencies  Comments (or additional information):    Contact info:  Pt @ [phone]     Tora Duck [place] @ [phone]  Patient/Family verbalized understanding of follow-up arrangements: Yes  Individual responsible for coordination of the follow-up plan: pt  Confirmed correct DME delivered: Lennart Pall 06/06/2019    [place]aid Tiger, [place] Nick

## 2019-06-06 NOTE — Telephone Encounter (Signed)
appt is cancelled but will route to PCP so she is aware of stroke

## 2019-06-06 NOTE — Telephone Encounter (Signed)
Best number [phone] Pt called to cancel her appointment with dr copland.  She called to cancel stating she is in hospital @ [facility]  She had stroke

## 2019-06-06 NOTE — Progress Notes (Signed)
[place] PHYSICAL MEDICINE & REHABILITATION PROGRESS NOTE   Subjective/Complaints: Patient seen laying in bed this morning.  She states she slept well overnight.  She is ready for discharge.  ROS: Denies CP, SOB, N/V/D  Objective:   No results found. No results for input(s): WBC, HGB, HCT, PLT in the last 72 hours. Recent Labs    06/05/19 0640  NA 140  K 4.1  CL 105  CO2 25  GLUCOSE 99  BUN 11  CREATININE 0.81  CALCIUM 9.3    Intake/Output Summary (Last 24 hours) at 06/06/2019 0930 Last data filed at 06/06/2019 0807 Gross per 24 hour  Intake 700 ml  Output -  Net 700 ml     Physical Exam: Vital Signs Blood pressure 135/78, pulse 73, temperature 98 F (36.7 C), temperature source Oral, resp. rate 16, height 5' 3"  (1.6 m), weight 104.4 kg, last menstrual period 11/29/2015, SpO2 96 %. Constitutional: No distress . Vital signs reviewed. HENT: Normocephalic.  Atraumatic. Eyes: EOMI. No discharge. Cardiovascular: No JVD. Respiratory: Normal effort.  No stridor. GI: Non-distended. Skin: Warm and dry.  Intact. Psych: Flat. Musc: No edema in extremities.  No tenderness in extremities. Neurological: Alert Motor: RUE/RLE: 5/5 proximal distal LUE/LLE: 4+/5 proximal to distal  Assessment/Plan: 1. Functional deficits secondary to right  CR, PLIC infarct which require 3+ hours per day of interdisciplinary therapy in a comprehensive inpatient rehab setting.  Physiatrist is providing close team supervision and 24 hour management of active medical problems listed below.  Physiatrist and rehab team continue to assess barriers to discharge/monitor patient progress toward functional and medical goals  Care Tool:  Bathing  Bathing activity did not occur: Safety/medical concerns Body parts bathed by patient: Left arm, Right arm, Face, Chest, Abdomen, Front perineal area, Buttocks, Right upper leg, Left upper leg, Right lower leg, Left lower leg   Body parts bathed by helper:  Buttocks, Right arm     Bathing assist Assist Level: Supervision/Verbal cueing     Upper Body Dressing/Undressing Upper body dressing Upper body dressing/undressing activity did not occur (including orthotics): N/A(patient already have gown on) What is the patient wearing?: Bra, Pull over shirt    Upper body assist Assist Level: Supervision/Verbal cueing    Lower Body Dressing/Undressing Lower body dressing    Lower body dressing activity did not occur: N/A What is the patient wearing?: Pants     Lower body assist Assist for lower body dressing: Supervision/Verbal cueing     Toileting Toileting    Toileting assist Assist for toileting: Supervision/Verbal cueing     Transfers Chair/bed transfer  Transfers assist     Chair/bed transfer assist level: Supervision/Verbal cueing     Locomotion Ambulation   Ambulation assist      Assist level: Supervision/Verbal cueing Assistive device: Walker-rolling Max distance: >150 ft   Walk 10 feet activity   Assist     Assist level: Supervision/Verbal cueing Assistive device: Walker-rolling   Walk 50 feet activity   Assist    Assist level: Supervision/Verbal cueing Assistive device: Walker-rolling    Walk 150 feet activity   Assist Walk 150 feet activity did not occur: Safety/medical concerns(decreased strength/activity tolerance)  Assist level: Supervision/Verbal cueing Assistive device: Walker-rolling    Walk 10 feet on uneven surface  activity   Assist Walk 10 feet on uneven surfaces activity did not occur: Safety/medical concerns(decreased strength/activity tolerance)   Assist level: Supervision/Verbal cueing Assistive device: Aeronautical engineer Will patient use  wheelchair at discharge?: No Type of Wheelchair: Manual Wheelchair activity did not occur: N/A(pt ambulatory)  Wheelchair assist level: Supervision/Verbal cueing Max wheelchair distance: 50'(outside on  unlevel surfaces)    Wheelchair 50 feet with 2 turns activity    Assist    Wheelchair 50 feet with 2 turns activity did not occur: N/A   Assist Level: Supervision/Verbal cueing   Wheelchair 150 feet activity     Assist  Wheelchair 150 feet activity did not occur: N/A       Blood pressure 135/78, pulse 73, temperature 98 F (36.7 C), temperature source Oral, resp. rate 16, height 5' 3"  (1.6 m), weight 104.4 kg, last menstrual period 11/29/2015, SpO2 96 %.  Medical Problem List and Plan: 1.Left-sided weaknessand hemi-sensory losssecondary toright posterior corona radiata/PL IC infarction likely secondary small vessel disease. Plan 30-day event monitor as outpatient  DC today  Will see patient for transitional care management in 1-2 weeks post-discharge 2. Antithrombotics: -DVT/anticoagulation:Lovenox -antiplatelet therapy: Aspirin 81 mg daily, Plavix 75 mg daily x3 weeks then aspirin alone 3. Pain Management:Tylenol as needed  -continue robaxin prn for spasms   4. Mood:Celexa 20 mg daily, Wellbutrin 300 mg daily   Stable at present -antipsychotic agents: N/A  -ambien for sleep 5. Neuropsych: This patientiscapable of making decisions on herown behalf. 6. Skin/Wound Care:Routine skin checks 7. Fluids/Electrolytes/Nutrition:Routine in and outs  BMP within normal limits on 10/6 8. Ulcerative colitis.Lialda2.4 mg twice daily  -continue probiotic  Imodium as needed-controlled with meds  Stable at present 9. Hyperlipidemia. Lipitor 10. History of asthma and remote tobacco abuse. Albuterol nebulizer as needed.  -continuenasonex and claritin for congestion   Continues to improve 11. ?Breast lump left, drainage  -pt will seen GYN at end of the month 12.  Acute blood loss anemia  Hemoglobin 11.5 on 9/26 13.  Hypokalemia  Potassium 4.1 on 10/6  LOS: 12 days A FACE TO FACE EVALUATION WAS PERFORMED  Gil Ingwersen Lorie Phenix 06/06/2019, 9:30 AM

## 2019-06-08 ENCOUNTER — Telehealth: Payer: Self-pay

## 2019-06-08 NOTE — Telephone Encounter (Signed)
Transitional Care call-patient    1. Are you/is patient experiencing any problems since coming home? No Are there any questions regarding any aspect of care? No 2. Are there any questions regarding medications administration/dosing? No Are meds being taken as prescribed? Yes Patient should review meds with caller to confirm 3. Have there been any falls? No 4. Has Home Health been to the house and/or have they contacted you? Outpatient rehab If not, have you tried to contact them? Can we help you contact them? 5. Are bowels and bladder emptying properly? Yes Are there any unexpected incontinence issues? No If applicable, is patient following bowel/bladder programs? 6. Any fevers, problems with breathing, unexpected pain? No 7. Are there any skin problems or new areas of breakdown? No 8. Has the patient/family member arranged specialty MD follow up (ie cardiology/neurology/renal/surgical/etc)? Yes Can we help arrange? 9. Does the patient need any other services or support that we can help arrange? No 10. Are caregivers following through as expected in assisting the patient? Yes 11. Has the patient quit smoking, drinking alcohol, or using drugs as recommended? Yes  Appointment time 1:40 pm arrive time 1:20 pm and with Danella Sensing, NP on 06/19/2019 [place]

## 2019-06-11 ENCOUNTER — Ambulatory Visit: Payer: BC Managed Care – PPO | Attending: Physician Assistant | Admitting: Occupational Therapy

## 2019-06-11 ENCOUNTER — Other Ambulatory Visit: Payer: Self-pay

## 2019-06-11 DIAGNOSIS — M6281 Muscle weakness (generalized): Secondary | ICD-10-CM | POA: Diagnosis not present

## 2019-06-11 DIAGNOSIS — R278 Other lack of coordination: Secondary | ICD-10-CM | POA: Insufficient documentation

## 2019-06-11 DIAGNOSIS — I69354 Hemiplegia and hemiparesis following cerebral infarction affecting left non-dominant side: Secondary | ICD-10-CM | POA: Diagnosis not present

## 2019-06-11 DIAGNOSIS — I639 Cerebral infarction, unspecified: Secondary | ICD-10-CM | POA: Diagnosis not present

## 2019-06-11 NOTE — Addendum Note (Signed)
Addended by: Charyl Bigger on: 06/11/2019 03:04 PM   Modules accepted: Orders

## 2019-06-11 NOTE — Therapy (Signed)
[place] MAIN [facility] SERVICES [address] [place], [place], [address] Phone: [phone]   Fax:  [phone]  Occupational Therapy Evaluation  Patient Details  Name: Carol Johnson MRN: [id number] Date of Birth: May 29, 1966 Referring Provider (OT): Dr Loura Pardon at [facility]   Encounter Date: 06/11/2019  OT End of Session - 06/11/19 1317    Visit Number  1    Number of Visits  24    Date for OT Re-Evaluation  08/16/19    Authorization Type  BCBS    OT Start Time  1105    OT Stop Time  1205    OT Time Calculation (min)  60 min    Equipment Utilized During Treatment  green theraputty    Activity Tolerance  Patient tolerated treatment well    Behavior During Therapy  Flat affect;Anxious       Past Medical History:  Diagnosis Date  . Abdominal pain, unspecified site   . Allergic rhinitis   . Anxiety   . Asthma   . Depression   . DVT (deep venous thrombosis) ([place]) 2016  . Fibroid uterus   . GERD (gastroesophageal reflux disease)   . Hypercholesterolemia   . Hypertension   . Internal hemorrhoid   . Irritable bowel syndrome   . Lymphocytic colitis 01/02/2015  . Pneumonia 1998  . Stroke ([place])   . Ulcerative colitis ([place])   . URI (upper respiratory infection)   . Uterine fibroid   . Vitamin B12 deficiency     Past Surgical History:  Procedure Laterality Date  . CESAREAN SECTION  2004  . CHOLECYSTECTOMY     laparoscopic "gallstones"  . ESOPHAGOGASTRODUODENOSCOPY (EGD) WITH PROPOFOL N/A 05/03/2014   Procedure: ESOPHAGOGASTRODUODENOSCOPY (EGD) WITH PROPOFOL;  Surgeon: Inda Castle, MD;  Location: [facility] ENDOSCOPY;  Service: Endoscopy;  Laterality: N/A;  . MYOMECTOMY    . UTERINE FIBROID SURGERY      There were no vitals filed for this visit.  Subjective Assessment - 06/11/19 1117    Subjective   Pt reports that her L hand and leg are weaker and needs to take time with walking with her walker.    Patient is accompanied by:   Family member    Pertinent History  Pt recently discharged from [place] at [facility] after having a CVA in R corona radiata and basal ganglia and was in CIR from 05/25/19-06/06/19.    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    Currently in Pain?  No/denies        [facility] OT Assessment - 06/11/19 1121      Assessment   Medical Diagnosis  R CVA    Referring Provider (OT)  Dr Loura Pardon at [facility]    Onset Date/Surgical Date  05/23/19    Hand Dominance  Right    Prior Therapy  [facility] [place]   Has the patient fallen in the past 6 months  No    Has the patient had a decrease in activity level because of a fear of falling?   Yes    Is the patient reluctant to leave their home because of a fear of falling?   No      Home  Environment   Alternate Level Stairs - Number of Steps  --  2 steps at [place] and 14 at her home   [employer];Other (comment)   has shower chair with back   Adaptive equipment  --   elastic shoe laces   Lives With  Family      Prior Function   Level of Independence  --   Sister and 79 year old son   Vocation  Full time employment    Vocation Requirements  --   [employer], phone, [employer], scheduling, bills,    Leisure  --   Scientist, research (life sciences), Development worker, community     ADL   Eating/Feeding  Independent    Upper Body Bathing  Independent    Lower Body Bathing  Independent    Upper Body Dressing  Minimal assistance    Upper Body Dressing Details  --   assist for zippers and buttons   Lower Body Dressing  Independent    Aeronautical engineer  Regular height toilet    Toileting -  Control and instrumentation engineer  Independent    ADL comments  --   Indep ADLs but needs hep with fine motor skills      IADL   Shopping  Assistance for transportation    Prior Level of Function Light Housekeeping  --    was independent and now needs mod assist   Prior Level of Function Meal Prep  --   pt was indpendent and now can make a sandwich if needed--mod   Prior Level of Function Community Mobility  --   Uses FWW   Prior Level of Function Medication Managment  --   Independent and uses pill box   Prior Level of Function Financial Management  --   online bill pay with recurrent payments and [place]     Mobility   Mobility Status  Needs assist    Mobility Status Comments  --   FWW for balance     Written Expression   Dominant Hand  Right    Handwriting  100% legible      Vision - History   Baseline Vision  Wears glasses all the time    Patient Visual Report  Other (comment)    Additional Comments  --   Pt with no changes in vision but wears glasses at all times      Vision Assessment   Eye Alignment  Within Functional Limits      Activity Tolerance   Activity Tolerance  Tolerates 10-20 min activity with multiple rests      Cognition   Overall Cognitive Status  Within Functional Limits for tasks assessed    Attention  Focused;Sustained;Selective      Sensation   Light Touch  Appears Intact      Coordination   9 Hole Peg Test  Left    Left 9 Hole Peg Test  --   L 45 sec and R 26 sec   Coordination  Pt presents with decreased motor control with mild increase in flexor tone in LUE and hand. Pt under shoots with finger to nose assessment with decreased depth perception.      Tone   Assessment Location  Left Upper Extremity      ROM / Strength   AROM / PROM / Strength  AROM      AROM   Overall AROM   Within functional limits for tasks performed    Overall AROM Comments  --   under shooting with L hand; numbness and  tingl in both hands     Hand Function   Right Hand Gross Grasp  Functional    Right Hand Grip (lbs)  34#    Right Hand Lateral Pinch  --   17#   Right Hand 3 Point Pinch  --   16#   Left Hand Gross Grasp  Impaired   12#   Left Hand Grip (lbs)  28#    Left  Hand Lateral Pinch  --   12#   Left 3 point pinch  --   11#   Comment  2 pt pinch R 10# L 2 Pt pinch 9#      LUE Tone   LUE Tone  Mild                      OT Education - 06/11/19 1317    Education Details  Green theraputty exercises for L hand    Person(s) Educated  Patient    Methods  Explanation;Demonstration;Handout    Comprehension  Verbalized understanding;Returned demonstration;Need further instruction       OT Short Term Goals - 06/11/19 1332      OT SHORT TERM GOAL #1   Title  Pt will be able to use L hand with R for zippers and buttons for upper body dressing independently    Baseline  pt is unable to complete    Time  12    Period  Weeks    Status  New    Target Date  08/16/19      OT SHORT TERM GOAL #2   Title  Pt will increase strength in L hand to 34# for use during ADLs.    Baseline  grip strength 28#    Time  12    Period  Weeks    Status  New    Target Date  08/16/19      OT SHORT TERM GOAL #3   Title  Pt will increase fine motor skills in L hand to complete 9 hole peg test in 30 seconds.    Baseline  45 seconds is needed for 9 hold peg test.    Time  12    Period  Weeks    Status  New    Target Date  08/16/19      OT SHORT TERM GOAL #4   Title  Pt will complete typing test using L hand with R on laptop with no more than 5 errors.    Baseline  pt is unable to use L hand to type    Time  12    Period  Weeks    Status  New    Target Date  08/16/19      OT SHORT TERM GOAL #5   Title  Pt will incorporate energy conservation tech during ADLs and IADLs.    Baseline  Pt is currently not using.    Time  12    Period  Weeks    Status  New    Target Date  08/16/19      Additional Short Term Goals   Additional Short Term Goals  Yes      OT SHORT TERM GOAL #6   Title  Pt will be educated in HEP for LUE strength and coordination skills training.    Baseline  pt does not have one currently but was given theraputty exercises with  written exercises today.    Time  12    Period  Weeks    Status  New    Target Date  08/16/19     Pt seen for OT evaluation this date as well as therapeutic exercises for HEP.   Therapeutic exercise:   Pt. Instructed in HEP for L hand strengthening using green theraputty.  She performed hand strengthening with cues for proper technique. Pt. worked on gross grip loop, lateral pinch, 3pt. pinch, gross digit and  extension. Pt. required verbal and tactile cues for proper technique.  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  She is currently living with her 37 year old son at her sister's house until she can safely return to living with her brother who is not as available to help her.  She is an Web designer at [employer] and presents with decreased fine motor control in L hand with mildly increased flexor tone and discomfort wtih using R hand due to carpal tunnel symptoms.  She uses a FWW for ambulation and is able to complete all self care skills for grooming, feeding and bathing and dressing but needs extra time and help with buttons, zippers and IADLs like cooking and cleaning due to decreased balance and weakness in LLE as well as LUE.  Her goal is to return back to full time work as an Web designer and be able to live with her son independently and drive again.  She does not present with any vision changes.  She needs mod assist for grocery shopping, cooking and cleaning and transportation.    OT Occupational Profile and History  Comprehensive Assessment- Review of records and extensive additional review of physical, cognitive, psychosocial  history related to current functional performance    Occupational performance deficits (Please refer to evaluation for details):  ADL's;IADL's;Work;Leisure    Body Structure / Function / Physical Skills  ADL;Dexterity;Balance;Coordination;FMC;Tone;UE functional use;Endurance;IADL    Psychosocial Skills  Optometrist and Behaviors    Rehab Potential  Excellent    Clinical Decision Making  Several treatment options, min-mod task modification necessary    Comorbidities Affecting Occupational Performance:  May have comorbidities impacting occupational performance    Modification or Assistance to Complete Evaluation   Min-Moderate modification of tasks or assist with assess necessary to complete eval    OT Frequency  2x / week    OT Duration  12 weeks    OT Treatment/Interventions  Self-care/ADL training;Therapeutic exercise;Balance training;Psychosocial skills training;Coping strategies training;Therapeutic activities;Neuromuscular education;DME and/or AE instruction;Patient/family education    OT Home Exercise Plan  theraputty exercises with HEP with green putty given to pt    Consulted and Agree with Plan of Care  Patient       Patient will benefit from skilled therapeutic intervention in order to improve the following deficits and impairments:   Body Structure / Function / Physical Skills: ADL, Dexterity, Balance, Coordination, FMC, Tone, UE functional use, Endurance, IADL   Psychosocial Skills: Coping Strategies, Routines and Behaviors   Visit Diagnosis: Small vessel cerebrovascular accident (CVA) ([place])  Muscle weakness (generalized)  Hemiplegia and hemiparesis following cerebral infarction affecting left non-dominant side (HCC)  Problem List Patient Active Problem List   Diagnosis Date Noted  . Muscle weakness (generalized) 06/11/2019  . Hemiplegia and hemiparesis following cerebral infarction affecting left non-dominant side ([place]) 06/11/2019  . Major depressive  disorder   . Hypokalemia   . Acute blood loss anemia   . Ulcerative colitis with complication ([place])   . Small vessel cerebrovascular accident (CVA) ([place]) 05/25/2019  . CVA (cerebral vascular accident) ([place]) 05/23/2019  . Thrombocytosis ([place]) 05/23/2019  . Coronary artery disease involving native coronary artery of native heart without angina pectoris 03/21/2018  . Coronary artery calcification 02/10/2018  . Morbid obesity ([place]) 02/10/2018  . Smoker 01/15/2018  . Dyspnea on exertion 12/21/2017  . Carpal tunnel syndrome 12/21/2017  . Stress incontinence 12/21/2017  . Paresthesia of both feet 07/04/2017  . Always thirsty 07/04/2017  . Abdominal bloating 12/01/2015  . Fatigue 12/01/2015  . Vitamin B12 deficiency 12/01/2015  . Lymphocytic colitis 01/16/2015  . Anemia, iron deficiency 01/16/2015  . Anxiety state 12/20/2014  . Diarrhea 11/27/2014  . Insomnia 11/27/2014  . History of DVT (deep vein thrombosis) 09/17/2014  . OSA (obstructive sleep apnea) 09/02/2014  . Gastritis, chronic 06/06/2014  . Unspecified gastritis and gastroduodenitis without mention of hemorrhage 05/03/2014  . Abdominal pain, epigastric 04/19/2014  . Internal hemorrhoids with other complication 61/68/3729  . Routine general medical examination at a health care facility 10/14/2011  . Thyroid nodule 05/16/2011  . Rectal bleeding 11/24/2010  . Hemorrhoids, internal 11/24/2010  . Irritable bowel syndrome (IBS) 11/24/2010  . ANEMIA 10/02/2009  . Hyperlipidemia 07/18/2009  . Essential hypertension 07/18/2009  . DYSPNEA ON EXERTION 07/18/2009  . THYROID CYST 01/31/2008  . ANXIETY 01/31/2008  . Depression with anxiety 01/31/2008  . GASTRIC ULCER, HX OF 01/31/2008  . FIBROIDS, UTERUS 11/13/2007  . ALLERGIC RHINITIS 11/13/2007  . ASTHMA 11/13/2007  . GERD 11/13/2007  . IRRITABLE BOWEL SYNDROME 11/13/2007  . INTERNAL HEMORRHOIDS 11/12/2003    Chrys Racer, OTR/L, [facility] ascom [phone] 06/11/19, 2:46  PM  [place] MAIN [facility] SERVICES [address] [place], [place], [address] Phone: [phone]   Fax:  [phone]  Name: Carol Johnson MRN: [id number] Date of Birth: May 31, 1966

## 2019-06-13 ENCOUNTER — Telehealth: Payer: Self-pay

## 2019-06-13 NOTE — Telephone Encounter (Signed)
Spoke to pt, gave her monitor details. Verified address. 30 day [employer] Event monitor ordered.

## 2019-06-14 ENCOUNTER — Other Ambulatory Visit: Payer: Self-pay

## 2019-06-14 ENCOUNTER — Ambulatory Visit: Payer: BC Managed Care – PPO | Admitting: Occupational Therapy

## 2019-06-14 DIAGNOSIS — I69354 Hemiplegia and hemiparesis following cerebral infarction affecting left non-dominant side: Secondary | ICD-10-CM | POA: Diagnosis not present

## 2019-06-14 DIAGNOSIS — M6281 Muscle weakness (generalized): Secondary | ICD-10-CM

## 2019-06-14 DIAGNOSIS — I639 Cerebral infarction, unspecified: Secondary | ICD-10-CM | POA: Diagnosis not present

## 2019-06-14 DIAGNOSIS — R278 Other lack of coordination: Secondary | ICD-10-CM | POA: Diagnosis not present

## 2019-06-14 NOTE — Therapy (Signed)
[place] MAIN [facility] SERVICES [address] [place], [place], [address] Phone: [phone]   Fax:  [phone]  Occupational Therapy Treatment  Patient Details  Name: Carol Johnson MRN: [id number] Date of Birth: 1966/04/23 Referring Provider (OT): Dr [facility] at [facility]   Encounter Date: 06/14/2019  OT End of Session - 06/14/19 1239    Visit Number  2    Number of Visits  24    Date for OT Re-Evaluation  08/16/19    Authorization Type  BCBS    OT Start Time  1125    OT Stop Time  1210    OT Time Calculation (min)  45 min    Activity Tolerance  Patient tolerated treatment well    Behavior During Therapy  Flat affect;Anxious       Past Medical History:  Diagnosis Date  . Abdominal pain, unspecified site   . Allergic rhinitis   . Anxiety   . Asthma   . Depression   . DVT (deep venous thrombosis) ([place]) 2016  . Fibroid uterus   . GERD (gastroesophageal reflux disease)   . Hypercholesterolemia   . Hypertension   . Internal hemorrhoid   . Irritable bowel syndrome   . Lymphocytic colitis 01/02/2015  . Pneumonia 1998  . Stroke ([place])   . Ulcerative colitis ([place])   . URI (upper respiratory infection)   . Uterine fibroid   . Vitamin B12 deficiency     Past Surgical History:  Procedure Laterality Date  . CESAREAN SECTION  2004  . CHOLECYSTECTOMY     laparoscopic "gallstones"  . ESOPHAGOGASTRODUODENOSCOPY (EGD) WITH PROPOFOL N/A 05/03/2014   Procedure: ESOPHAGOGASTRODUODENOSCOPY (EGD) WITH PROPOFOL;  Surgeon: Inda Castle, MD;  Location: [facility];  Service: Endoscopy;  Laterality: N/A;  . MYOMECTOMY    . UTERINE FIBROID SURGERY      There were no vitals filed for this visit.  Subjective Assessment - 06/14/19 1237    Subjective   Pt reports that she is feeling pretty good today but a little more tired.    Patient is accompanied by:  Family member    Pertinent History  Pt recently discharged from [place] at [facility]  after having a CVA in R corona radiata and basal ganglia and was in CIR from 05/25/19-06/06/19.    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    Currently in Pain?  No/denies      Neuromuscular re-ed:     Patient seen for using L hand for fine motor skills training with and without visual assistance for activities.  She needed cues and extra time to move small items in L hand from palm to finger tips and tends to use pad of fingers vs finger tips for manipulating nuts, bolts and washers and line them up.  She has pain in R wrist intermittently with tingling due to carpal tunnel like symptoms and reviewed exercises for this and how to stretch both hands and UEs when needed.  Less flexor tone noted today in LUE and hand.  She did well for picking up pegs in Gro[place] pegboard with mod cues for placement of wrist and finger tips but less substitution noted in L shoulder today.  OT Education - 06/14/19 1238    Education Details  [place] and mgt of R hand carpal tunnel symptoms    Person(s) Educated  Patient    Methods  Explanation;Demonstration;Handout    Comprehension  Verbalized understanding;Returned demonstration;Need further instruction       OT Short Term Goals - 06/11/19 1505      OT SHORT TERM GOAL #1   Target Date  08/27/19      OT SHORT TERM GOAL #2   Target Date  08/27/19      OT SHORT TERM GOAL #3   Target Date  08/27/19      OT SHORT TERM GOAL #4   Target Date  08/27/19      OT SHORT TERM GOAL #5   Target Date  08/27/19      OT SHORT TERM GOAL #6   Target Date  08/27/19               Plan - 06/14/19 1240    Clinical Impression Statement  Carol Johnson continues to be motivated to regain independence and functional use of LUE and hand and presents with pain and tingling in R hand as well due to carpal tunnel like  symptoms.  She reported that she bought a brace at [facility] to help with pain in R wrist but has not been wearing it very often.  Rec she wear it at night and as needed during the day to allow it to rest and allow time for inflammation to reduce.  Also discussed not sleeping on this UE at night and to keep it up on pillow as well as try icing with rec time frame.  She continues to have decreased awareness of position on LUE and hand and did well with nuts and bolts bench with L hand underneath so she could not see it for manipualtives with min cues.  She continues to work on neuromuscular re-ed, fine Engineer, site and strengthening for LUE and hand in prep for use for ADLs and IADLs.    OT Occupational Profile and History  Comprehensive Assessment- Review of records and extensive additional review of physical, cognitive, psychosocial history related to current functional performance    Occupational performance deficits (Please refer to evaluation for details):  ADL's;IADL's;Work;Leisure    Body Structure / Function / Physical Skills  ADL;Dexterity;Balance;Coordination;FMC;Tone;UE functional use;Endurance;IADL    Psychosocial Skills  Optometrist and Behaviors    Rehab Potential  Excellent    Clinical Decision Making  Several treatment options, min-mod task modification necessary    Comorbidities Affecting Occupational Performance:  May have comorbidities impacting occupational performance    Modification or Assistance to Complete Evaluation   Min-Moderate modification of tasks or assist with assess necessary to complete eval    OT Frequency  2x / week    OT Duration  12 weeks    OT Treatment/Interventions  Self-care/ADL training;Therapeutic exercise;Balance training;Psychosocial skills training;Coping strategies training;Therapeutic activities;Neuromuscular education;DME and/or AE instruction;Patient/family education    Consulted and Agree with Plan of Care  Patient       Patient  will benefit from skilled therapeutic intervention in order to improve the following deficits and impairments:   Body Structure / Function / Physical Skills: ADL, Dexterity, Balance, Coordination, FMC, Tone, UE functional use, Endurance, IADL   Psychosocial Skills: Coping Strategies, Routines and Behaviors   Visit Diagnosis: Muscle weakness (generalized)  Hemiplegia and hemiparesis following cerebral infarction affecting left non-dominant side [facility])    Problem List Patient Active Problem  List   Diagnosis Date Noted  . Muscle weakness (generalized) 06/11/2019  . Hemiplegia and hemiparesis following cerebral infarction affecting left non-dominant side ([place]) 06/11/2019  . Major depressive disorder   . Hypokalemia   . Acute blood loss anemia   . Ulcerative colitis with complication ([place])   . Small vessel cerebrovascular accident (CVA) ([place]) 05/25/2019  . CVA (cerebral vascular accident) ([place]) 05/23/2019  . Thrombocytosis ([place]) 05/23/2019  . Coronary artery disease involving native coronary artery of native heart without angina pectoris 03/21/2018  . Coronary artery calcification 02/10/2018  . Morbid obesity ([place]) 02/10/2018  . Smoker 01/15/2018  . Dyspnea on exertion 12/21/2017  . Carpal tunnel syndrome 12/21/2017  . Stress incontinence 12/21/2017  . Paresthesia of both feet 07/04/2017  . Always thirsty 07/04/2017  . Abdominal bloating 12/01/2015  . Fatigue 12/01/2015  . Vitamin B12 deficiency 12/01/2015  . Lymphocytic colitis 01/16/2015  . Anemia, iron deficiency 01/16/2015  . Anxiety state 12/20/2014  . Diarrhea 11/27/2014  . Insomnia 11/27/2014  . History of DVT (deep vein thrombosis) 09/17/2014  . OSA (obstructive sleep apnea) 09/02/2014  . Gastritis, chronic 06/06/2014  . Unspecified gastritis and gastroduodenitis without mention of hemorrhage 05/03/2014  . Abdominal pain, epigastric 04/19/2014  . Internal hemorrhoids with other complication 26/71/2458  . Routine  general medical examination at a health care facility 10/14/2011  . Thyroid nodule 05/16/2011  . Rectal bleeding 11/24/2010  . Hemorrhoids, internal 11/24/2010  . Irritable bowel syndrome (IBS) 11/24/2010  . ANEMIA 10/02/2009  . Hyperlipidemia 07/18/2009  . Essential hypertension 07/18/2009  . DYSPNEA ON EXERTION 07/18/2009  . THYROID CYST 01/31/2008  . ANXIETY 01/31/2008  . Depression with anxiety 01/31/2008  . GASTRIC ULCER, HX OF 01/31/2008  . FIBROIDS, UTERUS 11/13/2007  . ALLERGIC RHINITIS 11/13/2007  . ASTHMA 11/13/2007  . GERD 11/13/2007  . IRRITABLE BOWEL SYNDROME 11/13/2007  . INTERNAL HEMORRHOIDS 11/12/2003    Wofford,Susan 06/14/2019, 12:47 PM  [place] MAIN [facility] SERVICES [address] [place], [place], [address] Phone: [phone]   Fax:  [phone]  Name: Carol Johnson MRN: [id number] Date of Birth: 05/10/1966

## 2019-06-18 ENCOUNTER — Ambulatory Visit: Payer: BC Managed Care – PPO | Admitting: Occupational Therapy

## 2019-06-18 ENCOUNTER — Other Ambulatory Visit: Payer: Self-pay

## 2019-06-18 DIAGNOSIS — R278 Other lack of coordination: Secondary | ICD-10-CM | POA: Diagnosis not present

## 2019-06-18 DIAGNOSIS — M6281 Muscle weakness (generalized): Secondary | ICD-10-CM

## 2019-06-18 DIAGNOSIS — I639 Cerebral infarction, unspecified: Secondary | ICD-10-CM | POA: Diagnosis not present

## 2019-06-18 DIAGNOSIS — I69354 Hemiplegia and hemiparesis following cerebral infarction affecting left non-dominant side: Secondary | ICD-10-CM | POA: Diagnosis not present

## 2019-06-18 NOTE — Therapy (Signed)
[place] MAIN [facility] SERVICES [address] [place], [place], [address] Phone: [phone]   Fax:  [phone]  Occupational Therapy Treatment  Patient Details  Name: Carol Johnson MRN: [id number] Date of Birth: 03/29/1966 Referring Provider (OT): Dr [facility]   Encounter Date: 06/18/2019  OT End of Session - 06/18/19 1132    Visit Number  3    Number of Visits  24    Date for OT Re-Evaluation  08/16/19    Authorization Type  BCBS    OT Start Time  1121    OT Stop Time  1206    OT Time Calculation (min)  45 min    Activity Tolerance  Patient tolerated treatment well    Behavior During Therapy  Flat affect;Anxious       Past Medical History:  Diagnosis Date  . Abdominal pain, unspecified site   . Allergic rhinitis   . Anxiety   . Asthma   . Depression   . DVT (deep venous thrombosis) ([place]) 2016  . Fibroid uterus   . GERD (gastroesophageal reflux disease)   . Hypercholesterolemia   . Hypertension   . Internal hemorrhoid   . Irritable bowel syndrome   . Lymphocytic colitis 01/02/2015  . Pneumonia 1998  . Stroke ([place])   . Ulcerative colitis ([place])   . URI (upper respiratory infection)   . Uterine fibroid   . Vitamin B12 deficiency     Past Surgical History:  Procedure Laterality Date  . CESAREAN SECTION  2004  . CHOLECYSTECTOMY     laparoscopic "gallstones"  . ESOPHAGOGASTRODUODENOSCOPY (EGD) WITH PROPOFOL N/A 05/03/2014   Procedure: ESOPHAGOGASTRODUODENOSCOPY (EGD) WITH PROPOFOL;  Surgeon: Inda Castle, MD;  [facility];  Service: Endoscopy;  Laterality: N/A;  . MYOMECTOMY    . UTERINE FIBROID SURGERY      There were no vitals filed for this visit.  Subjective Assessment - 06/18/19 1126    Subjective   Pt reports she feels weaker and has a pain in L shoulder.    Patient is accompanied by:  Family member    Pertinent History  Pt recently discharged from [place] at [facility] after having a CVA  in R corona radiata and basal ganglia and was in CIR from 05/25/19-06/06/19.    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    Currently in Pain?  Yes    Pain Score  6     Pain Location  Shoulder    Pain Orientation  Left    Pain Descriptors / Indicators  Sore;Sharp    Pain Type  Acute pain    Pain Radiating Towards  forearm    Pain Onset  Yesterday    Pain Frequency  Intermittent    Aggravating Factors   rolling in bed or raising her arm    Pain Relieving Factors  rest    Effect of Pain on Daily Activities  limited use of L shoulder    Multiple Pain Sites  No      Neuromuscular re-ed:     Patient seen for training and verbal cues for [employer] skills with 1 inch velcro foam blocks using gross grasp and 2pt and 3pt pinch patterns with L hand with forearm supported to decrease pulling and pain in L shoulder.  Pt with difficulty using  tip pinch for 2 pt pattern but did well with 3 pt but dropped 2 cubes out of 20 with extra time for placing.  Red, white and blue clothespins with 3 squeezes for strengthening and placing on wooden dowel held in R hand but only able to complete for lower half due to pain in L shoulder area.    Manual Therapy:  Patient seen for 10 minutes of moist heat to L shoulder to help decrease muscle pain in rotator cuff area with trigger point releases and stretching both active and passive with pain decreasing from 6/10 to 2/10.    Patient did well during session and is seeing her primary care doctor tomorrow and to discuss rec for other muscle relaxant to help with L shoulder pain and increase in muscle tightness in LUE and LLE.                    OT Education - 06/18/19 1131    Education Details  [place] control of L hand and pain relieving    Person(s) Educated  Patient    Methods  Explanation;Demonstration;Handout    Comprehension  Verbalized  understanding;Returned demonstration;Need further instruction       OT Short Term Goals - 06/11/19 1505      OT SHORT TERM GOAL #1   Target Date  08/27/19      OT SHORT TERM GOAL #2   Target Date  08/27/19      OT SHORT TERM GOAL #3   Target Date  08/27/19      OT SHORT TERM GOAL #4   Target Date  08/27/19      OT SHORT TERM GOAL #5   Target Date  08/27/19      OT SHORT TERM GOAL #6   Target Date  08/27/19               Plan - 06/18/19 1217    Clinical Impression Statement  Carol Johnson presented with pain in L posterior shoulder today and feels her current muscle relaxer is not working very well.  Encouraged her to talk to her doctor about this tomorrow. She continues to be motivated to regain independence and functional use of LUE and hand and presents with pain and tingling in R hand as well due to carpal tunnel like symptoms.  She reported that the R wrist brace she bought at [facility] to help with pain in R wrist is causing numbness and tingling and rec she look at the position of her wrist and bring to therapy session to assess.    She continues to have decreased awareness of position on LUE and hand and is limited with any reaching activity now due to pain in L shoulder.  Worked on strengtening and [facility] skills with L hand w/o reaching while hot pack was placed on L shoulder with trigger point releases to rotator cuff muscles with good releif and pain decreased from 6/10 to 2/10.  She continues to work on neuromuscular re-ed, fine Engineer, site and strengthening for LUE and hand in prep for use for ADLs and IADLs.    OT Occupational Profile and History  Problem Focused Assessment - Including review of records relating to presenting problem    Occupational performance deficits (Please refer to evaluation for details):  ADL's;IADL's;Work;Leisure    Body Structure / Function / Physical Skills  ADL;Dexterity;Balance;Coordination;FMC;Tone;UE functional use;Endurance;IADL     Psychosocial Skills  Optometrist and Behaviors    Rehab Potential  Excellent    Clinical Decision Making  Several treatment options, min-mod task modification necessary    Comorbidities Affecting Occupational Performance:  May have comorbidities impacting occupational performance    OT Frequency  2x / week    OT Duration  12 weeks    OT Treatment/Interventions  Self-care/ADL training;Therapeutic exercise;Balance training;Psychosocial skills training;Coping strategies training;Therapeutic activities;Neuromuscular education;DME and/or AE instruction;Patient/family education    Consulted and Agree with Plan of Care  Patient       Patient will benefit from skilled therapeutic intervention in order to improve the following deficits and impairments:   Body Structure / Function / Physical Skills: ADL, Dexterity, Balance, Coordination, FMC, Tone, UE functional use, Endurance, IADL   Psychosocial Skills: Coping Strategies, Routines and Behaviors   Visit Diagnosis: Hemiplegia and hemiparesis following cerebral infarction affecting left non-dominant side ([facility])  Muscle weakness (generalized)    Problem List Patient Active Problem List   Diagnosis Date Noted  . Muscle weakness (generalized) 06/11/2019  . Hemiplegia and hemiparesis following cerebral infarction affecting left non-dominant side ([place]) 06/11/2019  . Major depressive disorder   . Hypokalemia   . Acute blood loss anemia   . Ulcerative colitis with complication ([place])   . Small vessel cerebrovascular accident (CVA) ([place]) 05/25/2019  . CVA (cerebral vascular accident) ([place]) 05/23/2019  . Thrombocytosis ([place]) 05/23/2019  . Coronary artery disease involving native coronary artery of native heart without angina pectoris 03/21/2018  . Coronary artery calcification 02/10/2018  . Morbid obesity ([place]) 02/10/2018  . Smoker 01/15/2018  . Dyspnea on exertion 12/21/2017  . Carpal tunnel syndrome 12/21/2017  . Stress  incontinence 12/21/2017  . Paresthesia of both feet 07/04/2017  . Always thirsty 07/04/2017  . Abdominal bloating 12/01/2015  . Fatigue 12/01/2015  . Vitamin B12 deficiency 12/01/2015  . Lymphocytic colitis 01/16/2015  . Anemia, iron deficiency 01/16/2015  . Anxiety state 12/20/2014  . Diarrhea 11/27/2014  . Insomnia 11/27/2014  . History of DVT (deep vein thrombosis) 09/17/2014  . OSA (obstructive sleep apnea) 09/02/2014  . Gastritis, chronic 06/06/2014  . Unspecified gastritis and gastroduodenitis without mention of hemorrhage 05/03/2014  . Abdominal pain, epigastric 04/19/2014  . Internal hemorrhoids with other complication 26/83/4196  . Routine general medical examination at a health care facility 10/14/2011  . Thyroid nodule 05/16/2011  . Rectal bleeding 11/24/2010  . Hemorrhoids, internal 11/24/2010  . Irritable bowel syndrome (IBS) 11/24/2010  . ANEMIA 10/02/2009  . Hyperlipidemia 07/18/2009  . Essential hypertension 07/18/2009  . DYSPNEA ON EXERTION 07/18/2009  . THYROID CYST 01/31/2008  . ANXIETY 01/31/2008  . Depression with anxiety 01/31/2008  . GASTRIC ULCER, HX OF 01/31/2008  . FIBROIDS, UTERUS 11/13/2007  . ALLERGIC RHINITIS 11/13/2007  . ASTHMA 11/13/2007  . GERD 11/13/2007  . IRRITABLE BOWEL SYNDROME 11/13/2007  . INTERNAL HEMORRHOIDS 11/12/2003    , 06/18/2019, 12:24 PM  [place] MAIN [facility] SERVICES [address] [place], [place], [address] Phone: [phone]   Fax:  [phone]  Name: Carol Johnson MRN: [id number] Date of Birth: 03-19-1966

## 2019-06-19 ENCOUNTER — Encounter: Payer: Self-pay | Admitting: Registered Nurse

## 2019-06-19 ENCOUNTER — Other Ambulatory Visit: Payer: Self-pay

## 2019-06-19 ENCOUNTER — Encounter: Payer: BC Managed Care – PPO | Attending: Registered Nurse | Admitting: Registered Nurse

## 2019-06-19 VITALS — BP 124/83 | HR 86 | Temp 97.7°F | Ht 63.0 in | Wt 226.0 lb

## 2019-06-19 DIAGNOSIS — I639 Cerebral infarction, unspecified: Secondary | ICD-10-CM | POA: Diagnosis not present

## 2019-06-19 DIAGNOSIS — F5101 Primary insomnia: Secondary | ICD-10-CM | POA: Diagnosis not present

## 2019-06-19 DIAGNOSIS — E7849 Other hyperlipidemia: Secondary | ICD-10-CM | POA: Diagnosis not present

## 2019-06-19 DIAGNOSIS — M62838 Other muscle spasm: Secondary | ICD-10-CM

## 2019-06-19 NOTE — Patient Instructions (Addendum)
[facility]: [phone]: To [facility] Follow up appointment.

## 2019-06-19 NOTE — Progress Notes (Signed)
Subjective:    Patient ID: Carol Johnson, female    DOB: Mar 22, 1966, 53 y.o.   MRN: [id number]  HPI: Carol Johnson is a 53 y.o. female who is here for transitional care visit in follow up of her small vessel CVA, hyperlipidemia, insomnia and muscle spasm. She presented to the emergency room with complaints of left sided weakness.  CT Head WO Contrast:  : 1. Invagination of CSF into the sella, a finding of doubtful clinical significance. Ventricles normal in size and configuration.  2.  Brain parenchyma appears unremarkable.  No mass or hemorrhage.  3.  Mild mucosal thickening in several ethmoid air cells. MR Brain WO Contrast: IMPRESSION: MRI brain: MR Angio Head WO Contrast  1. Motion degraded examination. 2. 2.1 x 0.7 cm acute infarct involving right corona radiata/internal capsule and extending into the right basal ganglia. 3. Mild chronic small vessel ischemic disease.  MRA head:  1. Motion degraded examination. 2. No evidence of intracranial large vessel occlusion or high-grade proximal arterial stenosis. 3. A 2 mm aneurysm arising the A2 right anterior cerebral artery is questioned, although evaluation is somewhat limited due to motion degradation. Consider follow-up CT or MR angiography. Neurology was consulted placed on aspirin and plavix X 3 weeks then aspirin alone.   Carol Johnson was admitted to inpatient therapy on 05/25/2019 and discharged home on 06/06/2019. She is receiving outpatient therapy at [facility]. She reports she has pain in her left arm, left shoulder and upper back mainly left side, she describes her pain as muscle spasms. She rates her pain 0. Also reports she has a good appetite.   Pain Inventory Average Pain 0 Pain Right Now 0 My pain is no pain  In the last 24 hours, has pain interfered with the following? General activity 0 Relation with others 0 Enjoyment of life 0 What TIME of day is your pain at its worst? no pain Sleep (in  general) NA  Pain is worse with: no pain Pain improves with: no pain Relief from Meds: no pain  Mobility walk with assistance use a walker ability to climb steps?  no do you drive?  no  Function disabled: date disabled .  Neuro/Psych trouble walking spasms  Prior Studies transitional care  Physicians involved in your care transitional care   Family History  Problem Relation Age of Onset  . Pulmonary fibrosis Father   . Colon cancer Paternal Aunt   . Uterine cancer Paternal Grandmother        with mets to the colon   . Heart failure Maternal Grandmother   . Heart disease Paternal Uncle   . Heart disease Paternal Aunt   . Kidney disease Cousin   . Hyperlipidemia Mother   . Mitral valve prolapse Mother   . Heart disease Mother        arrhythmia   Social History   Socioeconomic History  . Marital status: Married    Spouse name: Not on file  . Number of children: 1  . Years of education: Not on file  . Highest education level: Not on file  Occupational History  . Occupation: Admin. assistant  Social Needs  . Financial resource strain: Not on file  . Food insecurity    Worry: Not on file    Inability: Not on file  . Transportation needs    Medical: Not on file    Non-medical: Not on file  Tobacco Use  . Smoking status: Former Smoker  Packs/day: 0.10    Years: 10.00    Pack years: 1.00    Types: Cigarettes    Quit date: 12/07/2017    Years since quitting: 1.5  . Smokeless tobacco: Never Used  Substance and Sexual Activity  . Alcohol use: Yes    Alcohol/week: 5.0 standard drinks    Types: 5 Cans of beer per week  . Drug use: No  . Sexual activity: Yes    Birth control/protection: Pill  Lifestyle  . Physical activity    Days per week: Not on file    Minutes per session: Not on file  . Stress: Not on file  Relationships  . Social Herbalist on phone: Not on file    Gets together: Not on file    Attends religious service: Not on file     Active member of club or organization: Not on file    Attends meetings of clubs or organizations: Not on file    Relationship status: Not on file  Other Topics Concern  . Not on file  Social History Narrative  . Not on file   Past Surgical History:  Procedure Laterality Date  . CESAREAN SECTION  2004  . CHOLECYSTECTOMY     laparoscopic "gallstones"  . ESOPHAGOGASTRODUODENOSCOPY (EGD) WITH PROPOFOL N/A 05/03/2014   Procedure: ESOPHAGOGASTRODUODENOSCOPY (EGD) WITH PROPOFOL;  Surgeon: Inda Castle, MD;  Location: [facility];  Service: Endoscopy;  Laterality: N/A;  . MYOMECTOMY    . UTERINE FIBROID SURGERY     Past Medical History:  Diagnosis Date  . Abdominal pain, unspecified site   . Allergic rhinitis   . Anxiety   . Asthma   . Depression   . DVT (deep venous thrombosis) ([place]) 2016  . Fibroid uterus   . GERD (gastroesophageal reflux disease)   . Hypercholesterolemia   . Hypertension   . Internal hemorrhoid   . Irritable bowel syndrome   . Lymphocytic colitis 01/02/2015  . Pneumonia 1998  . Stroke ([place])   . Ulcerative colitis ([place])   . URI (upper respiratory infection)   . Uterine fibroid   . Vitamin B12 deficiency    BP 124/83   Pulse 86   Temp 97.7 F (36.5 C)   Ht 5' 3"  (1.6 m)   Wt 226 lb (102.5 kg)   LMP 11/29/2015   SpO2 95%   BMI 40.03 kg/m   Opioid Risk Score:   Fall Risk Score:  `1  Depression screen PHQ 2/9  Depression screen [facility] 2/9 04/06/2019 07/04/2017 06/27/2013  Decreased Interest 1 1 0  Down, Depressed, Hopeless 1 1 0  PHQ - 2 Score 2 2 0  Altered sleeping 1 1 -  Tired, decreased energy 1 1 -  Change in appetite 2 2 -  Feeling bad or failure about yourself  2 1 -  Trouble concentrating 1 0 -  Moving slowly or fidgety/restless 0 1 -  Suicidal thoughts 0 0 -  PHQ-9 Score 9 8 -  Some recent data might be hidden    Review of Systems  Constitutional: Negative.   HENT: Negative.   Eyes: Negative.   Respiratory: Negative.    Cardiovascular: Negative.   Gastrointestinal: Negative.   Endocrine: Negative.   Genitourinary: Negative.   Musculoskeletal: Positive for gait problem.       Spasms   Skin: Negative.   Allergic/Immunologic: Negative.   Hematological: Negative.   Psychiatric/Behavioral: Negative.   All other systems reviewed and are negative.  Objective:   Physical Exam Vitals signs and nursing note reviewed.  Constitutional:      Appearance: Normal appearance.  Neck:     Musculoskeletal: Normal range of motion and neck supple.  Cardiovascular:     Rate and Rhythm: Normal rate and regular rhythm.     Pulses: Normal pulses.     Heart sounds: Normal heart sounds.  Pulmonary:     Effort: Pulmonary effort is normal.     Breath sounds: Normal breath sounds.  Musculoskeletal:     Comments: Normal Muscle Bulk and Muscle Testing Reveals:  Upper Extremities: Full ROM and Muscle Strength 5/5  Lower Extremities: Full ROM and Muscle Strength 5/5  Arises from Table with ease using walker for support Narrow Based Gait  Skin:    General: Skin is warm and dry.  Neurological:     Mental Status: She is alert and oriented to person, place, and time.  Psychiatric:        Mood and Affect: Mood normal.        Behavior: Behavior normal.           Assessment & Plan:  1. Small vessel CVA: Continue Outpatient Therapy at [facility]. Continue to Monitor. Has a HFU appointment with Neurology. Continue current medication regimen. 2 Hyperlipidemia: Continue current medication regimen. PCP Following.  3.  Insomnia: Continue current medication regimen. PCP Following. 4. Muscle spasm.Continue current medication regimen. PCP Following.   20 minutes of face to face patient care time was spent during this visit. All questions were encouraged and answered.  F/U in 4- 6 weeks with Dr Posey Pronto

## 2019-06-20 DIAGNOSIS — Z6841 Body Mass Index (BMI) 40.0 and over, adult: Secondary | ICD-10-CM | POA: Diagnosis not present

## 2019-06-20 DIAGNOSIS — Z1329 Encounter for screening for other suspected endocrine disorder: Secondary | ICD-10-CM | POA: Diagnosis not present

## 2019-06-20 DIAGNOSIS — Z1231 Encounter for screening mammogram for malignant neoplasm of breast: Secondary | ICD-10-CM | POA: Diagnosis not present

## 2019-06-20 DIAGNOSIS — N6452 Nipple discharge: Secondary | ICD-10-CM | POA: Diagnosis not present

## 2019-06-20 DIAGNOSIS — Z01419 Encounter for gynecological examination (general) (routine) without abnormal findings: Secondary | ICD-10-CM | POA: Diagnosis not present

## 2019-06-20 DIAGNOSIS — Z1151 Encounter for screening for human papillomavirus (HPV): Secondary | ICD-10-CM | POA: Diagnosis not present

## 2019-06-21 ENCOUNTER — Other Ambulatory Visit: Payer: Self-pay

## 2019-06-21 ENCOUNTER — Telehealth: Payer: Self-pay | Admitting: Family Medicine

## 2019-06-21 ENCOUNTER — Ambulatory Visit: Payer: BC Managed Care – PPO | Admitting: Occupational Therapy

## 2019-06-21 DIAGNOSIS — M6281 Muscle weakness (generalized): Secondary | ICD-10-CM

## 2019-06-21 DIAGNOSIS — I639 Cerebral infarction, unspecified: Secondary | ICD-10-CM | POA: Diagnosis not present

## 2019-06-21 DIAGNOSIS — I69354 Hemiplegia and hemiparesis following cerebral infarction affecting left non-dominant side: Secondary | ICD-10-CM | POA: Diagnosis not present

## 2019-06-21 DIAGNOSIS — R278 Other lack of coordination: Secondary | ICD-10-CM | POA: Diagnosis not present

## 2019-06-21 MED ORDER — METHOCARBAMOL 500 MG PO TABS: 500.0000 mg | ORAL_TABLET | Freq: Four times a day (QID) | ORAL | 3 refills | Status: DC | PRN

## 2019-06-21 NOTE — Telephone Encounter (Signed)
I sent it  

## 2019-06-21 NOTE — Telephone Encounter (Signed)
Pt is requesting Robaxin refill sent to [facility] states that this was prescribed by the Hospital when she was admitted for a stroke.  Pt was advised by her PT/OT to contact PCP office for refills.   This helps with the muscle spasms in her left arm which are a result of the stroke.   Please advise, thanks.

## 2019-06-21 NOTE — Therapy (Signed)
[place] MAIN [facility] SERVICES [address] [place], [place], [address] Phone: [phone]   Fax:  [phone]  Occupational Therapy Treatment  Patient Details  Name: Carol Johnson MRN: [id number] Date of Birth: 16-Jan-1966 Referring Provider (OT): Dr Loura Pardon at [facility]   Encounter Date: 06/21/2019  OT End of Session - 06/21/19 0956    Visit Number  4    Number of Visits  24    Date for OT Re-Evaluation  08/16/19    Authorization Type  BCBS    OT Start Time  0940    OT Stop Time  1015    OT Time Calculation (min)  35 min    Activity Tolerance  Patient tolerated treatment well    Behavior During Therapy  Flat affect;Anxious       Past Medical History:  Diagnosis Date  . Abdominal pain, unspecified site   . Allergic rhinitis   . Anxiety   . Asthma   . Depression   . DVT (deep venous thrombosis) ([place]) 2016  . Fibroid uterus   . GERD (gastroesophageal reflux disease)   . Hypercholesterolemia   . Hypertension   . Internal hemorrhoid   . Irritable bowel syndrome   . Lymphocytic colitis 01/02/2015  . Pneumonia 1998  . Stroke ([place])   . Ulcerative colitis ([place])   . URI (upper respiratory infection)   . Uterine fibroid   . Vitamin B12 deficiency     Past Surgical History:  Procedure Laterality Date  . CESAREAN SECTION  2004  . CHOLECYSTECTOMY     laparoscopic "gallstones"  . ESOPHAGOGASTRODUODENOSCOPY (EGD) WITH PROPOFOL N/A 05/03/2014   Procedure: ESOPHAGOGASTRODUODENOSCOPY (EGD) WITH PROPOFOL;  Surgeon: Inda Castle, MD;  Location: [facility];  Service: Endoscopy;  Laterality: N/A;  . MYOMECTOMY    . UTERINE FIBROID SURGERY      There were no vitals filed for this visit.  Subjective Assessment - 06/21/19 0945    Subjective   Pt is worried that her back to work date is 07-21-19.  her L shoulder is better today and is excited about PT starting next week.    Patient is accompanied by:  Family member    Pertinent  History  Pt recently discharged from [place] [facility] after having a CVA in R corona radiata and basal ganglia and was in CIR from 05/25/19-06/06/19.    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    Currently in Pain?  Yes    Pain Score  6     Pain Location  Shoulder    Pain Orientation  Left    Pain Descriptors / Indicators  Sore    Pain Type  Acute pain    Pain Radiating Towards  up my arm toward neck    Pain Onset  In the past 7 days    Pain Frequency  Intermittent    Aggravating Factors   reaching back with IR or ER    Pain Relieving Factors  rest and no reaching back    Effect of Pain on Daily Activities  unable to move arm back behind me    Multiple Pain Sites  No          Neuromuscular re-ed:     Patient seen for training and verbal cues for [facility] skills with tweezers and  variety of toothpick sizes using 2 pt and translatory movements in L hand .  Verbal cues needed for technique and position of L hand.  Red, white and blue clothespins with 3 squeezes for strengthening and placing on wooden dowel held in R hand but only able to complete for lower half due to pain in L shoulder area.    Manual Therapy:  Patient seen for 10 minutes of moist heat to L shoulder to help decrease muscle pain in rotator cuff area with trigger point releases and stretching both active and passive with pain decreasing from 6/10 to 2/10.    Patient reported that her doctor wants her to wear halter monitor for a month but has not started to wear it yet.                      OT Education - 06/21/19 0950    Education Details  [place] control of L hand and pain relieving for L shoulder    Person(s) Educated  Patient    Methods  Explanation;Demonstration;Handout    Comprehension  Verbalized understanding;Returned demonstration;Need further instruction       OT Short Term  Goals - 06/11/19 1505      OT SHORT TERM GOAL #1   Target Date  08/27/19      OT SHORT TERM GOAL #2   Target Date  08/27/19      OT SHORT TERM GOAL #3   Target Date  08/27/19      OT SHORT TERM GOAL #4   Target Date  08/27/19      OT SHORT TERM GOAL #5   Target Date  08/27/19      OT SHORT TERM GOAL #6   Target Date  08/27/19               Plan - 06/21/19 0956    Clinical Impression Statement  Carol Johnson is concerned that her return to work date is Nov 21 and does not feel she will be ready 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  She presented with less pain in L posterior shoulder today at rest which was 0 but with extension and ER it increased to 6/10.  She is almost out of her musce relaxer and discussed calling her primary care doc to refill it since the PA at the hospital and feels her current muscle relaxer is not working very well.  She continues to be motivated to regain independence and functional use of LUE and hand and presents with pain and tingling in R hand as well due to carpal tunnel like symptoms which have improved since she is positioning it differently at night now after education.  She continues to have decreased awareness of position on LUE and hand and is limited with any reaching activity now due to pain in L shoulder.  Worked on strengtening and [facility] skills with L hand w/o reaching while hot pack was placed on L shoulder with trigger point releases to rotator cuff muscles with good releif and pain decreased from 6/10 to 2/10.  She continues to work on neuromuscular re-ed, fine Engineer, site and strengthening for LUE and hand in prep for use for ADLs and IADLs.    OT Occupational Profile and History  Problem Focused Assessment - Including review of records relating to presenting problem    Occupational performance deficits (Please refer to evaluation for details):  ADL's;IADL's;Work;Leisure    Body Structure / Function / Physical Skills  ADL;Dexterity;Balance;Coordination;FMC;Tone;UE functional use;Endurance;IADL    Psychosocial Skills  Optometrist and Behaviors    Rehab Potential  Excellent    Clinical Decision Making  Several treatment options, min-mod task modification necessary    Comorbidities Affecting Occupational Performance:  May have comorbidities impacting occupational performance    Modification or Assistance to Complete Evaluation   Min-Moderate modification of tasks or assist with assess necessary to complete eval    OT Frequency  2x / week    OT Duration  12 weeks    OT Treatment/Interventions  Self-care/ADL training;Therapeutic exercise;Balance training;Psychosocial skills training;Coping strategies training;Therapeutic activities;Neuromuscular education;DME and/or AE instruction;Patient/family education    OT Home Exercise Plan  Rec she continue typing at home on keyboard and is currrently up to 20 minutes.    Consulted and Agree with Plan of Care  Patient       Patient will benefit from skilled therapeutic intervention in order to improve the following deficits and impairments:   Body Structure / Function / Physical Skills: ADL, Dexterity, Balance, Coordination, FMC, Tone, UE functional use, Endurance, IADL   Psychosocial Skills: Coping Strategies, Routines and Behaviors   Visit Diagnosis: Muscle weakness (generalized)    Problem List Patient Active Problem List   Diagnosis Date Noted  . Muscle weakness (generalized) 06/11/2019  . Hemiplegia and hemiparesis following cerebral infarction affecting left non-dominant side ([place]) 06/11/2019  . Major depressive disorder   . Hypokalemia   . Acute blood loss anemia   . Ulcerative colitis with complication ([place])   . Small vessel cerebrovascular accident (CVA) ([place]) 05/25/2019  . CVA (cerebral vascular accident) ([place]) 05/23/2019  . Thrombocytosis ([place]) 05/23/2019  . Coronary artery disease involving native coronary artery of native heart  without angina pectoris 03/21/2018  . Coronary artery calcification 02/10/2018  . Morbid obesity ([place]) 02/10/2018  . Smoker 01/15/2018  . Dyspnea on exertion 12/21/2017  . Carpal tunnel syndrome 12/21/2017  . Stress incontinence 12/21/2017  . Paresthesia of both feet 07/04/2017  . Always thirsty 07/04/2017  . Abdominal bloating 12/01/2015  . Fatigue 12/01/2015  . Vitamin B12 deficiency 12/01/2015  . Lymphocytic colitis 01/16/2015  . Anemia, iron deficiency 01/16/2015  . Anxiety state 12/20/2014  . Diarrhea 11/27/2014  . Insomnia 11/27/2014  . History of DVT (deep vein thrombosis) 09/17/2014  . OSA (obstructive sleep apnea) 09/02/2014  . Gastritis, chronic 06/06/2014  . Unspecified gastritis and gastroduodenitis without mention of hemorrhage 05/03/2014  . Abdominal pain, epigastric 04/19/2014  . Internal hemorrhoids with other complication 28/36/6294  . Routine general medical examination at a health care facility 10/14/2011  . Thyroid nodule 05/16/2011  . Rectal bleeding 11/24/2010  . Hemorrhoids, internal 11/24/2010  . Irritable bowel syndrome (IBS) 11/24/2010  . ANEMIA 10/02/2009  . Hyperlipidemia 07/18/2009  . Essential hypertension 07/18/2009  . DYSPNEA ON EXERTION 07/18/2009  . THYROID CYST 01/31/2008  . ANXIETY 01/31/2008  . Depression with anxiety 01/31/2008  . GASTRIC ULCER, HX OF 01/31/2008  . FIBROIDS, UTERUS 11/13/2007  . ALLERGIC RHINITIS 11/13/2007  . ASTHMA 11/13/2007  . GERD 11/13/2007  . IRRITABLE BOWEL SYNDROME 11/13/2007  . INTERNAL HEMORRHOIDS 11/12/2003    Chrys Racer, OTR/L, [facility] ascom [phone] 06/21/19, 10:12 AM  [place] MAIN [facility] SERVICES [address] [place], [place], [address] Phone: [phone]   Fax:  [phone]  Name: ATOYA ANDREW MRN: [id number] Date of Birth: 23-Sep-1965

## 2019-06-24 ENCOUNTER — Ambulatory Visit (INDEPENDENT_AMBULATORY_CARE_PROVIDER_SITE_OTHER): Payer: BC Managed Care – PPO

## 2019-06-24 DIAGNOSIS — I639 Cerebral infarction, unspecified: Secondary | ICD-10-CM

## 2019-06-24 DIAGNOSIS — I4891 Unspecified atrial fibrillation: Secondary | ICD-10-CM

## 2019-06-24 DIAGNOSIS — I63031 Cerebral infarction due to thrombosis of right carotid artery: Secondary | ICD-10-CM | POA: Diagnosis not present

## 2019-06-25 ENCOUNTER — Telehealth: Payer: Self-pay

## 2019-06-25 ENCOUNTER — Ambulatory Visit: Payer: BC Managed Care – PPO | Admitting: Occupational Therapy

## 2019-06-25 ENCOUNTER — Encounter: Payer: Self-pay | Admitting: Occupational Therapy

## 2019-06-25 ENCOUNTER — Other Ambulatory Visit: Payer: Self-pay

## 2019-06-25 ENCOUNTER — Other Ambulatory Visit: Payer: Self-pay | Admitting: Gastroenterology

## 2019-06-25 DIAGNOSIS — R278 Other lack of coordination: Secondary | ICD-10-CM | POA: Diagnosis not present

## 2019-06-25 DIAGNOSIS — I639 Cerebral infarction, unspecified: Secondary | ICD-10-CM | POA: Diagnosis not present

## 2019-06-25 DIAGNOSIS — M6281 Muscle weakness (generalized): Secondary | ICD-10-CM | POA: Diagnosis not present

## 2019-06-25 DIAGNOSIS — I69354 Hemiplegia and hemiparesis following cerebral infarction affecting left non-dominant side: Secondary | ICD-10-CM

## 2019-06-25 NOTE — Telephone Encounter (Signed)
Ptn called and states is having difficulty getting in touch with [employer]-- asked who needs documents to sign for out of work - advised to fax here.  Gave appt time here for AP.

## 2019-06-25 NOTE — Therapy (Signed)
[place] MAIN [facility] SERVICES [address] [place], [place], [address] Phone: [phone]   Fax:  [phone]  Occupational Therapy Treatment  Patient Details  Name: Carol Johnson MRN: [id number] Date of Birth: 1966/06/19 Referring Provider (OT): Dr Loura Pardon at [facility]   Encounter Date: 06/25/2019  OT End of Session - 06/26/19 0833    Visit Number  5    Number of Visits  24    Date for OT Re-Evaluation  08/16/19    Authorization Type  [employer]    OT Start Time  [phone]    OT Stop Time  1015    OT Time Calculation (min)  41 min    Activity Tolerance  Patient tolerated treatment well    Behavior During Therapy  Flat affect;Anxious       Past Medical History:  Diagnosis Date  . Abdominal pain, unspecified site   . Allergic rhinitis   . Anxiety   . Asthma   . Depression   . DVT (deep venous thrombosis) ([place]) 2016  . Fibroid uterus   . GERD (gastroesophageal reflux disease)   . Hypercholesterolemia   . Hypertension   . Internal hemorrhoid   . Irritable bowel syndrome   . Lymphocytic colitis 01/02/2015  . Pneumonia 1998  . Stroke ([place])   . Ulcerative colitis ([place])   . URI (upper respiratory infection)   . Uterine fibroid   . Vitamin B12 deficiency     Past Surgical History:  Procedure Laterality Date  . CESAREAN SECTION  2004  . CHOLECYSTECTOMY     laparoscopic "gallstones"  . ESOPHAGOGASTRODUODENOSCOPY (EGD) WITH PROPOFOL N/A 05/03/2014   Procedure: ESOPHAGOGASTRODUODENOSCOPY (EGD) WITH PROPOFOL;  Surgeon: Inda Castle, MD;  Location: [facility];  Service: Endoscopy;  Laterality: N/A;  . MYOMECTOMY    . UTERINE FIBROID SURGERY      There were no vitals filed for this visit.  Subjective Assessment - 06/26/19 0832    Subjective   Patient reports she has been working on exercises at home and is doing well.  Still has difficulty with typing and not as fast as she used to be. She denies pain in her left shoulder today.    Pertinent History  Pt recently discharged from CIR at [facility] after having a CVA in R corona radiata and basal ganglia and was in CIR from 05/25/19-06/06/19.    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        Therapeutic Exercises:   Patient seen for LUE reaching tasks with use of shape tower from seated position to place objects with graduated multi levels, cues for gripping patterns, weight shifting and reaching towards all levels, completed forwards and back.  Some tightness in shoulder with reach but denies pain, cues to decrease compensatory movement patterns at the shoulder during task.  Grip strengthening with 17# for 25 repetitions with cues for sustained gripping patterns on the left to grasp object with gripper and hold until placing into container.  Patient able to complete 2 sets this date.   Neuromuscular reeducation: Patient seen for typing test this date for 5 min test 22 WPM to establish current level of skill.  Patient demonstrating difficulty on the left side with coordination and proprioception, hitting the caps lock key repeatedly and having to correct mistakes.  Additional typing completed  and patient also feels this keyboard is a little more spread out than hers at home, encouraged her to work on typing at home and see if she has the same issue on her computer.  Her job requires a significant amount of time typing and use of a computer.        Response to tx:  Patient denies any pain in left shoulder this date and reports it has felt much better since last week.  She does report some tightness in the shoulder when performing reaching tasks this date.  She is demonstrating improved ROM and movement patterns with improved control when reaching overhead although she still demonstrates impairments with this.  Patient demonstrates difficulty with speed of typing and  WPM were 22 this date compared to her normal.  She had difficulty on the left with hitting the caps lock key repeatedly when attempting to complete a typing test secondary to decreased proprioception in left as well as decreased motor control and coordination.  Continue to work towards goals in plan of care to maximize safety and independence in necessary daily tasks.             OT Education - 06/26/19 [phone]    Education Details  typing, grip strength, reaching    Person(s) Educated  Patient    Methods  Explanation;Demonstration;Handout    Comprehension  Verbalized understanding;Returned demonstration;Need further instruction       OT Short Term Goals - 06/26/19 0839      OT SHORT TERM GOAL #1   Title  Pt will be able to use L hand with R for zippers and buttons for upper body dressing independently    Baseline  pt is unable to complete    Time  12    Period  Weeks    Status  New    Target Date  08/16/19      OT SHORT TERM GOAL #2   Title  Pt will increase strength in L hand to 34# for use during ADLs.    Baseline  grip strength 28#    Time  12    Period  Weeks    Status  New    Target Date  08/16/19      OT SHORT TERM GOAL #3   Title  Pt will increase fine motor skills in L hand to complete 9 hole peg test in 30 seconds.    Baseline  45 seconds is needed for 9 hold peg test.    Time  12    Period  Weeks    Status  New    Target Date  08/16/19      OT SHORT TERM GOAL #4   Title  Pt will complete typing test using L hand with R on laptop with no more than 5 errors.    Baseline  pt is unable to use L hand to type    Time  12    Period  Weeks    Status  New    Target Date  08/16/19      OT SHORT TERM GOAL #5   Title  Pt will incorporate energy conservation tech during ADLs and IADLs.    Baseline  Pt is currently not using.    Time  12    Period  Weeks    Status  New    Target Date  08/16/19      OT SHORT TERM GOAL #6   Title  Pt will be educated  in HEP for  LUE strength and coordination skills training.    Baseline  pt does not have one currently but was given theraputty exercises with written exercises today.    Time  12    Period  Weeks    Status  New    Target Date  08/16/19               Plan - 06/26/19 0834    Clinical Impression Statement  Patient denies any pain in left shoulder this date and reports it has felt much better since last week.  She does report some tightness in the shoulder when performing reaching tasks this date.  She is demonstrating improved ROM and movement patterns with improved control when reaching overhead although she still demonstrates impairments with this.  Patient demonstrates difficulty with speed of typing and WPM were 22 this date compared to her normal.  She had difficulty on the left with hitting the caps lock key repeatedly when attempting to complete a typing test secondary to decreased proprioception in left as well as decreased motor control and coordination.  Continue to work towards goals in plan of care to maximize safety and independence in necessary daily tasks.    OT Occupational Profile and History  Problem Focused Assessment - Including review of records relating to presenting problem    Occupational performance deficits (Please refer to evaluation for details):  ADL's;IADL's;Work;Leisure    Body Structure / Function / Physical Skills  ADL;Dexterity;Balance;Coordination;FMC;Tone;UE functional use;Endurance;IADL    Psychosocial Skills  Coping Strategies;Routines and Behaviors    Rehab Potential  Excellent    Clinical Decision Making  Several treatment options, min-mod task modification necessary    Comorbidities Affecting Occupational Performance:  May have comorbidities impacting occupational performance    OT Frequency  2x / week    OT Duration  12 weeks    OT Treatment/Interventions  Self-care/ADL training;Therapeutic exercise;Balance training;Psychosocial skills training;Coping  strategies training;Therapeutic activities;Neuromuscular education;DME and/or AE instruction;Patient/family education    Consulted and Agree with Plan of Care  Patient       Patient will benefit from skilled therapeutic intervention in order to improve the following deficits and impairments:   Body Structure / Function / Physical Skills: ADL, Dexterity, Balance, Coordination, FMC, Tone, UE functional use, Endurance, IADL   Psychosocial Skills: Coping Strategies, Routines and Behaviors   Visit Diagnosis: Muscle weakness (generalized)  Hemiplegia and hemiparesis following cerebral infarction affecting left non-dominant side (HCC)  Other lack of coordination    Problem List Patient Active Problem List   Diagnosis Date Noted  . Muscle weakness (generalized) 06/11/2019  . Hemiplegia and hemiparesis following cerebral infarction affecting left non-dominant side ([place]) 06/11/2019  . Major depressive disorder   . Hypokalemia   . Acute blood loss anemia   . Ulcerative colitis with complication ([place])   . Small vessel cerebrovascular accident (CVA) ([place]) 05/25/2019  . CVA (cerebral vascular accident) ([place]) 05/23/2019  . Thrombocytosis ([place]) 05/23/2019  . Coronary artery disease involving native coronary artery of native heart without angina pectoris 03/21/2018  . Coronary artery calcification 02/10/2018  . Morbid obesity ([place]) 02/10/2018  . Smoker 01/15/2018  . Dyspnea on exertion 12/21/2017  . Carpal tunnel syndrome 12/21/2017  . Stress incontinence 12/21/2017  . Paresthesia of both feet 07/04/2017  . Always thirsty 07/04/2017  . Abdominal bloating 12/01/2015  . Fatigue 12/01/2015  . Vitamin B12 deficiency 12/01/2015  . Lymphocytic colitis 01/16/2015  . Anemia, iron deficiency 01/16/2015  . Anxiety state 12/20/2014  .  Diarrhea 11/27/2014  . Insomnia 11/27/2014  . History of DVT (deep vein thrombosis) 09/17/2014  . OSA (obstructive sleep apnea) 09/02/2014  . Gastritis, chronic  06/06/2014  . Unspecified gastritis and gastroduodenitis without mention of hemorrhage 05/03/2014  . Abdominal pain, epigastric 04/19/2014  . Internal hemorrhoids with other complication 15/40/0867  . Routine general medical examination at a health care facility 10/14/2011  . Thyroid nodule 05/16/2011  . Rectal bleeding 11/24/2010  . Hemorrhoids, internal 11/24/2010  . Irritable bowel syndrome (IBS) 11/24/2010  . ANEMIA 10/02/2009  . Hyperlipidemia 07/18/2009  . Essential hypertension 07/18/2009  . DYSPNEA ON EXERTION 07/18/2009  . THYROID CYST 01/31/2008  . ANXIETY 01/31/2008  . Depression with anxiety 01/31/2008  . GASTRIC ULCER, HX OF 01/31/2008  . FIBROIDS, UTERUS 11/13/2007  . ALLERGIC RHINITIS 11/13/2007  . ASTHMA 11/13/2007  . GERD 11/13/2007  . IRRITABLE BOWEL SYNDROME 11/13/2007  . INTERNAL HEMORRHOIDS 11/12/2003    T Tomasita Morrow, OTR/L, CLT  , 06/27/2019, 8:40 AM  [place] MAIN [facility] SERVICES [place], [place], [address] Phone: [phone]   Fax:  [phone]  Name: ARTELIA GAME MRN: [id number] Date of Birth: 05-Jan-1966

## 2019-06-26 ENCOUNTER — Other Ambulatory Visit: Payer: Self-pay | Admitting: Gastroenterology

## 2019-06-27 ENCOUNTER — Ambulatory Visit: Payer: BC Managed Care – PPO | Admitting: Occupational Therapy

## 2019-06-27 ENCOUNTER — Encounter: Payer: Self-pay | Admitting: Physical Therapy

## 2019-06-27 ENCOUNTER — Ambulatory Visit: Payer: BC Managed Care – PPO | Admitting: Physical Therapy

## 2019-06-27 ENCOUNTER — Other Ambulatory Visit: Payer: Self-pay

## 2019-06-27 DIAGNOSIS — I639 Cerebral infarction, unspecified: Secondary | ICD-10-CM

## 2019-06-27 DIAGNOSIS — R278 Other lack of coordination: Secondary | ICD-10-CM

## 2019-06-27 DIAGNOSIS — M6281 Muscle weakness (generalized): Secondary | ICD-10-CM

## 2019-06-27 DIAGNOSIS — I69354 Hemiplegia and hemiparesis following cerebral infarction affecting left non-dominant side: Secondary | ICD-10-CM

## 2019-06-27 NOTE — Therapy (Signed)
[place] MAIN [facility] SERVICES [address] [place], [place], [address] Phone: [phone]   Fax:  [phone]  Physical Therapy Evaluation  Patient Details  Name: Carol Johnson MRN: [id number] Date of Birth: 04-08-1966 Referring Provider (PT): Lauraine Rinne   Encounter Date: 06/27/2019  PT End of Session - 06/27/19 1157    Visit Number  1    Number of Visits  25    Date for PT Re-Evaluation  09/19/19    PT Start Time  1150    PT Stop Time  1230    PT Time Calculation (min)  40 min    Equipment Utilized During Treatment  Gait belt    Activity Tolerance  Patient tolerated treatment well    Behavior During Therapy  [facility] for tasks assessed/performed       Past Medical History:  Diagnosis Date  . Abdominal pain, unspecified site   . Allergic rhinitis   . Anxiety   . Asthma   . Depression   . DVT (deep venous thrombosis) ([place]) 2016  . Fibroid uterus   . GERD (gastroesophageal reflux disease)   . Hypercholesterolemia   . Hypertension   . Internal hemorrhoid   . Irritable bowel syndrome   . Lymphocytic colitis 01/02/2015  . Pneumonia 1998  . Stroke ([place])   . Ulcerative colitis ([place])   . URI (upper respiratory infection)   . Uterine fibroid   . Vitamin B12 deficiency     Past Surgical History:  Procedure Laterality Date  . CESAREAN SECTION  2004  . CHOLECYSTECTOMY     laparoscopic "gallstones"  . ESOPHAGOGASTRODUODENOSCOPY (EGD) WITH PROPOFOL N/A 05/03/2014   Procedure: ESOPHAGOGASTRODUODENOSCOPY (EGD) WITH PROPOFOL;  Surgeon: Inda Castle, MD;  Location: [facility] ENDOSCOPY;  Service: Endoscopy;  Laterality: N/A;  . MYOMECTOMY    . UTERINE FIBROID SURGERY      There were no vitals filed for this visit.   Subjective Assessment - 06/27/19 1149    Subjective  Patient wants to know when she will be able to not use her RW. She also wants to know when she can drive a car again.    Pertinent History  Pt recently discharged from  CIR at [facility] after having a CVA in R corona radiata and basal ganglia and was in CIR from 05/25/19-06/06/19.    Currently in Pain?  No/denies    Pain Score  0-No pain    Pain Onset  In the past 7 days         [facility] PT Assessment - 06/27/19 1151      Assessment   Medical Diagnosis  cva    Referring Provider (PT)  Lauraine Rinne    Onset Date/Surgical Date  05/23/19    Hand Dominance  Right    Prior Therapy  hospital       Precautions   Precautions  None      Restrictions   Weight Bearing Restrictions  No      Balance Screen   Has the patient fallen in the past 6 months  No    Has the patient had a decrease in activity level because of a fear of falling?   Yes    Is the patient reluctant to leave their home because of a fear of falling?   No      Home Environment   Living Environment  Private residence    Living Arrangements  Other relatives    Available Help at  Discharge  Family    Type of [place] to enter    Entrance Stairs-Number of Steps  1    Entrance Stairs-Rails  None    Home Layout  One level    [place] - 2 wheels      Prior Function   Level of Independence  Independent with household mobility with device    Vocation  Full time employment    Vocation Requirements  typing, sitting, talkling on the phone    Leisure  paint, wreaths, children, shop       Cognition   Overall Cognitive Status  Within Functional Limits for tasks assessed      Standardized Balance Assessment   Standardized Balance Assessment  Berg Balance Test  (Pended)       Berg Balance Test   Sit to Stand  Able to stand without using hands and stabilize independently  (Pended)     Standing Unsupported  Able to stand safely 2 minutes  (Pended)     Sitting with Back Unsupported but Feet Supported on Floor or Stool  Able to sit safely and securely 2 minutes  (Pended)     Stand to Sit  Sits safely with minimal use of hands  (Pended)     Transfers   Able to transfer safely, minor use of hands  (Pended)     Standing Unsupported with Eyes Closed  Able to stand 10 seconds safely  (Pended)     Standing Unsupported with Feet Together  Able to place feet together independently and stand 1 minute safely  (Pended)     From Standing, Reach Forward with Outstretched Arm  Can reach forward >12 cm safely (5")  (Pended)     From Standing Position, Pick up Object from [place] to pick up shoe, needs supervision  (Pended)     From Standing Position, Turn to Look Behind Over each Shoulder  Looks behind from both sides and weight shifts well  (Pended)     Turn 360 Degrees  Able to turn 360 degrees safely in 4 seconds or less  (Pended)     Standing Unsupported, Alternately Place Feet on Step/Stool  Able to complete 4 steps without aid or supervision  (Pended)     Standing Unsupported, One Foot in [place] to place foot tandem independently and hold 30 seconds  (Pended)     Standing on One Leg  Able to lift leg independently and hold > 10 seconds  (Pended)     Total Score  52  (Pended)        PAIN: No reports of pain  POSTURE: WFL   PROM/AROM: WFL BLE, Patient reports that her right toes curl under in her sneaker  Formal testing deferred to next treatment due to time constrains   STRENGTH:  Graded on a 0-5 scale Muscle Group Left Right                          Hip Flex 3+/5 -4/5  Hip Abd 3+/5 -4/5  Hip Add -3/5 3/5  Hip Ext 3/5 3+/5      Knee Flex 5/5 5/5  Knee Ext 5/5 5/5  Ankle DF 5/5 5/5  Ankle PF 4/5 4/5   SENSATION: WNL BUE and BLE   FUNCTIONAL MOBILITY: Transfer sit to stand without UE assist Rolling supine to prone with MI  Coordination : Deferred  to next treatment due to time constraints    BALANCE: Static Standing Balance  Normal Able to maintain standing balance against maximal resistance   Good Able to maintain standing balance against moderate resistance   Good-/Fair+ Able to maintain standing balance against  minimal resistance x  Fair Able to stand unsupported without UE support and without LOB for 1-2 min   Fair- Requires Min A and UE support to maintain standing without loss of balance   Poor+ Requires mod A and UE support to maintain standing without loss of balance   Poor Requires max A and UE support to maintain standing balance without loss    Standing Dynamic Balance  Normal Stand independently unsupported, able to weight shift and cross midline maximally   Good Stand independently unsupported, able to weight shift and cross midline moderately x  Good-/Fair+ Stand independently unsupported, able to weight shift across midline minimally   Fair Stand independently unsupported, weight shift, and reach ipsilaterally, loss of balance when crossing midline   Poor+ Able to stand with Min A and reach ipsilaterally, unable to weight shift   Poor Able to stand with Mod A and minimally reach ipsilaterally, unable to cross midline.       GAIT Patient has decreased motor control of foot placement during gait, with decreased gait speed Patient is MI ascending/descending steps with railing, left toe got caught on the step x 1, patient educated to always hold the railings. :Patient ambulates with RW on level surfaces.  She was educated that she can transition to a spc for outdoors at next visit.  OUTCOME MEASURES: TEST Outcome Interpretation  5 times sit<>stand 22.80 sec >60 yo, >15 sec indicates increased risk for falls  10 meter walk test  .61                m/s <1.0 m/s indicates increased risk for falls; limited community ambulator  Timed up and Go   16.59              sec <14 sec indicates increased risk for falls  6 minute walk test                Feet deferred to next visit 1000 feet is community Water quality scientist 52/56 <36/56 (100% risk for falls), 37-45 (80% risk for falls); 46-51 (>50% risk for falls); 52-55 (lower risk <25% of falls)    HEP: Heel raises , single leg x  20 holding onto counter top         Objective measurements completed on examination: See above findings.              PT Education - 06/27/19 1156    Education Details  plan of care    Person(s) Educated  Patient    Methods  Explanation    Comprehension  Verbalized understanding       PT Short Term Goals - 06/27/19 1411      PT SHORT TERM GOAL #1   Title  Patient will be independent in home exercise program to improve strength/mobility for better functional independence with ADLs.    Time  6    Period  Weeks    Status  New    Target Date  08/08/19      PT SHORT TERM GOAL #2   Title  Patient (< 22 years old) will complete five times sit to stand test in < 10 seconds indicating an increased LE strength and improved balance.  Time  6    Period  Weeks    Status  New    Target Date  08/08/19        PT Long Term Goals - 06/27/19 1412      PT LONG TERM GOAL #1   Title  Patient will increase Berg Balance score by > 6 points to demonstrate decreased fall risk during functional activities.    Time  12    Period  Weeks    Status  New    Target Date  09/19/19      PT LONG TERM GOAL #2   Title  Patient will increase six minute walk test distance to >1200 for progression to community ambulator and improve gait ability    Time  12    Period  Weeks    Status  New    Target Date  09/19/19      PT LONG TERM GOAL #3   Title  Patient will increase 10 meter walk test to >1.73ms as to improve gait speed for better community ambulation and to reduce fall risk    Time  12    Period  Weeks    Status  New    Target Date  09/19/19      PT LONG TERM GOAL #4   Title  Patient will reduce timed up and go to <11 seconds to reduce fall risk and demonstrate improved transfer/gait ability.    Time  12    Period  Weeks    Status  New    Target Date  09/19/19             Plan - 06/27/19 1157    Clinical Impression Statement  Patient is s/p cva 05/23/19. She has  decreased BLE strength, decreased dynamic standing balance, decreased gait with Rw with decreased motor control of BLE. Her Berg balance test was 52/56 and was educated about next visit instruction with spc for outside safety. She is MI ascending and descending 12 steps with railing. She has been living with her sister due to needing supervision with ambulation. She was told today, based on her balance tests and gait on stairs,  that she is able to return home.  She will benefit from skilled PT to improve balance and strength and safety.    Examination-Activity Limitations  Carry    Examination-Participation Restrictions  Driving    Stability/Clinical Decision Making  Stable/Uncomplicated    Clinical Decision Making  Low    Rehab Potential  Good    PT Frequency  2x / week    PT Duration  12 weeks    PT Treatment/Interventions  Gait training;Stair training;Neuromuscular re-education;Balance training;Therapeutic exercise;Manual techniques;Dry needling;Passive range of motion       Patient will benefit from skilled therapeutic intervention in order to improve the following deficits and impairments:  Abnormal gait, Decreased strength, Decreased activity tolerance  Visit Diagnosis: Muscle weakness (generalized)  Hemiplegia and hemiparesis following cerebral infarction affecting left non-dominant side (HCC)  Other lack of coordination  Small vessel cerebrovascular accident (CVA) ([name]     Problem List Patient Active Problem List   Diagnosis Date Noted  . Muscle weakness (generalized) 06/11/2019  . Hemiplegia and hemiparesis following cerebral infarction affecting left non-dominant side ([place] 06/11/2019  . Major depressive disorder   . Hypokalemia   . Acute blood loss anemia   . Ulcerative colitis with complication ([place]   . Small vessel [place] ([place]A) ([place] 05/25/2019  . CVA (cerebral vascular  accident) ([place]) 05/23/2019  . Thrombocytosis ([place]) 05/23/2019  . Coronary  artery disease involving native coronary artery of native heart without angina pectoris 03/21/2018  . Coronary artery calcification 02/10/2018  . Morbid obesity ([place]) 02/10/2018  . Smoker 01/15/2018  . Dyspnea on exertion 12/21/2017  . Carpal tunnel syndrome 12/21/2017  . Stress incontinence 12/21/2017  . Paresthesia of both feet 07/04/2017  . Always thirsty 07/04/2017  . Abdominal bloating 12/01/2015  . Fatigue 12/01/2015  . Vitamin B12 deficiency 12/01/2015  . Lymphocytic colitis 01/16/2015  . Anemia, iron deficiency 01/16/2015  . Anxiety state 12/20/2014  . Diarrhea 11/27/2014  . Insomnia 11/27/2014  . History of DVT (deep vein thrombosis) 09/17/2014  . OSA (obstructive sleep apnea) 09/02/2014  . Gastritis, chronic 06/06/2014  . Unspecified gastritis and gastroduodenitis without mention of hemorrhage 05/03/2014  . Abdominal pain, epigastric 04/19/2014  . Internal hemorrhoids with other complication 62/82/4175  . Routine general medical examination at a health care facility 10/14/2011  . Thyroid nodule 05/16/2011  . Rectal bleeding 11/24/2010  . Hemorrhoids, internal 11/24/2010  . Irritable bowel syndrome (IBS) 11/24/2010  . ANEMIA 10/02/2009  . Hyperlipidemia 07/18/2009  . Essential hypertension 07/18/2009  . DYSPNEA ON EXERTION 07/18/2009  . THYROID CYST 01/31/2008  . ANXIETY 01/31/2008  . Depression with anxiety 01/31/2008  . GASTRIC ULCER, HX OF 01/31/2008  . FIBROIDS, UTERUS 11/13/2007  . ALLERGIC RHINITIS 11/13/2007  . ASTHMA 11/13/2007  . GERD 11/13/2007  . IRRITABLE BOWEL SYNDROME 11/13/2007  . INTERNAL HEMORRHOIDS 11/12/2003    Alanson Puls, PT DPT 06/27/2019, 2:27 PM  [place] MAIN [facility] SERVICES [address] [place], [place], [address] Phone: [phone]   Fax:  [phone]  Name: YANNA LEAKS MRN: [id number] Date of Birth: 1966-06-20

## 2019-06-28 ENCOUNTER — Telehealth: Payer: Self-pay | Admitting: Gastroenterology

## 2019-06-28 ENCOUNTER — Encounter: Payer: Self-pay | Admitting: Family Medicine

## 2019-06-28 MED ORDER — MESALAMINE 1.2 G PO TBEC: 2.4000 g | DELAYED_RELEASE_TABLET | Freq: Two times a day (BID) | ORAL | 1 refills | Status: DC

## 2019-06-28 NOTE — Therapy (Signed)
[place] MAIN [facility] SERVICES [address] [place], [place], [address] Phone: [phone]   Fax:  [phone]  Occupational Therapy Treatment  Patient Details  Name: Carol Johnson MRN: [id number] Date of Birth: 1966/01/20 Referring Provider (OT): Dr Loura Pardon at [facility]   Encounter Date: 06/27/2019  OT End of Session - 06/28/19 0859    Visit Number  6    Number of Visits  24    Date for OT Re-Evaluation  08/16/19    Authorization Type  [employer]    OT Start Time  1100    OT Stop Time  1144    OT Time Calculation (min)  44 min    Activity Tolerance  Patient tolerated treatment well    Behavior During Therapy  [facility] for tasks assessed/performed       Past Medical History:  Diagnosis Date  . Abdominal pain, unspecified site   . Allergic rhinitis   . Anxiety   . Asthma   . Depression   . DVT (deep venous thrombosis) ([place]) 2016  . Fibroid uterus   . GERD (gastroesophageal reflux disease)   . Hypercholesterolemia   . Hypertension   . Internal hemorrhoid   . Irritable bowel syndrome   . Lymphocytic colitis 01/02/2015  . Pneumonia 1998  . Stroke ([place])   . Ulcerative colitis ([place])   . URI (upper respiratory infection)   . Uterine fibroid   . Vitamin B12 deficiency     Past Surgical History:  Procedure Laterality Date  . CESAREAN SECTION  2004  . CHOLECYSTECTOMY     laparoscopic "gallstones"  . ESOPHAGOGASTRODUODENOSCOPY (EGD) WITH PROPOFOL N/A 05/03/2014   Procedure: ESOPHAGOGASTRODUODENOSCOPY (EGD) WITH PROPOFOL;  Surgeon: Inda Castle, MD;  Location: [facility] ENDOSCOPY;  Service: Endoscopy;  Laterality: N/A;  . MYOMECTOMY    . UTERINE FIBROID SURGERY      There were no vitals filed for this visit.  Subjective Assessment - 06/28/19 0856    Subjective   Patient reports she is doing well, was able to spend some time with her son the other day, he took her to run errands she needed to do.  She is glad he now has his drivers license  since this happened and she is not currently driving.  Discussed she will need to talk with her physician regarding return to driving clearance.    Pertinent History  Pt recently discharged from CIR at [facility] after having a CVA in R corona radiata and basal ganglia and was in CIR from 05/25/19-06/06/19.    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    Currently in Pain?  No/denies    Pain Score  0-No pain       Patient reports she is happy to have her PT evaluation today, she is hoping to get back home eventually and needs to be able to navigate 20 stairs.    Therapeutic Exercise:   Patient seen for UE strengthening on left for grip strength 17# for 1 set of 25 repetitions, 1 set of 10 repetitions with 23#, cues for grip on hand gripper, multiple cues to make adjustment of hand and for sustained gripping pattern to pick up object with gripper and move to container.  Reaching tasks to place items in elevated plane of motion to encourage overhead reach for obtaining items  from shelf or closet.     Neuromuscular Reeducation: Patient seen for manipulation of small 1/2 to 1 inch washers to pick up from tabletop and place onto hooks on magnetic board in elevated plane to further encourage reaching patterns.  Cues for separating small washers, prehension patterns to manipulate.  Patient seen for card flipping to focus on thumb and finger combinations, alternating movement patterns for these fingers, cues and therapist demo required.     Response to tx:  Patient with no complaints of pain this date and has not had any shoulder pain in the clinic during this week.  She has been working on improving strength and reaching so that she can hopefully return home soon, she has been staying at her sisters.  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  She has her PT evaluation this date and hopeful for some recommendations.  She has inquired about return to driving and advised her she will need to check with her physician for clearance.  Patient continues to demonstrate impairments in strength and coordination skills of the left hand but continues to improve in these areas and is performing more tasks at home on a daily basis.  She has not yet returned to cooking and cleaning tasks and would like to be able to prepare heart healthy meals when she returns home.                  OT Education - 06/28/19 [phone]    Education Details  fine motor coordination, reaching patterns    Person(s) Educated  Patient    Methods  Explanation;Demonstration    Comprehension  Verbalized understanding;Returned demonstration;Need further instruction       OT Short Term Goals - 06/26/19 0839      OT SHORT TERM GOAL #1   Title  Pt will be able to use L hand with R for zippers and buttons for upper body dressing independently    Baseline  pt is unable to complete    Time  12    Period  Weeks    Status  New    Target Date  08/16/19      OT SHORT TERM GOAL #2   Title  Pt will increase strength in L hand to 34# for use during ADLs.    Baseline  grip strength 28#    Time  12    Period  Weeks    Status  New    Target Date  08/16/19      OT SHORT TERM GOAL #3   Title  Pt will increase fine motor skills in L hand to complete 9 hole peg test in 30 seconds.    Baseline  45 seconds is needed for 9 hold peg test.    Time  12    Period  Weeks    Status  New    Target Date  08/16/19      OT SHORT TERM GOAL #4   Title  Pt will complete typing test using L hand with R on laptop with no more than 5 errors.    Baseline  pt is unable to use L hand to type    Time  12    Period  Weeks    Status  New    Target Date  08/16/19      OT SHORT TERM GOAL #5  Title  Pt will incorporate energy conservation tech during ADLs and  IADLs.    Baseline  Pt is currently not using.    Time  12    Period  Weeks    Status  New    Target Date  08/16/19      OT SHORT TERM GOAL #6   Title  Pt will be educated in HEP for LUE strength and coordination skills training.    Baseline  pt does not have one currently but was given theraputty exercises with written exercises today.    Time  12    Period  Weeks    Status  New    Target Date  08/16/19               Plan - 06/28/19 0859    Clinical Impression Statement  Patient with no complaints of pain this date and has not had any shoulder pain in the clinic during this week.  She has been working on improving strength and reaching so that she can hopefully return home soon, she has been staying at her sisters.  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  She has her PT evaluation this date and hopeful for some recommendations.  She has inquired about return to driving and advised her she will need to check with her physician for clearance.  Patient continues to demonstrate impairments in strength and coordination skills of the left hand but continues to improve in these areas and is performing more tasks at home on a daily basis.  She has not yet returned to cooking and cleaning tasks and would like to be able to prepare heart healthy meals when she returns home.    OT Occupational Profile and History  Problem Focused Assessment - Including review of records relating to presenting problem    Occupational performance deficits (Please refer to evaluation for details):  ADL's;IADL's;Work;Leisure    Body Structure / Function / Physical Skills  ADL;Dexterity;Balance;Coordination;FMC;Tone;UE functional use;Endurance;IADL    Psychosocial Skills  Optometrist and Behaviors    Rehab Potential  Excellent    Clinical Decision Making  Several treatment options, min-mod task modification necessary     Comorbidities Affecting Occupational Performance:  May have comorbidities impacting occupational performance    Modification or Assistance to Complete Evaluation   Min-Moderate modification of tasks or assist with assess necessary to complete eval    OT Frequency  2x / week    OT Duration  12 weeks    OT Treatment/Interventions  Self-care/ADL training;Therapeutic exercise;Balance training;Psychosocial skills training;Coping strategies training;Therapeutic activities;Neuromuscular education;DME and/or AE instruction;Patient/family education    Consulted and Agree with Plan of Care  Patient       Patient will benefit from skilled therapeutic intervention in order to improve the following deficits and impairments:   Body Structure / Function / Physical Skills: ADL, Dexterity, Balance, Coordination, FMC, Tone, UE functional use, Endurance, IADL   Psychosocial Skills: Coping Strategies, Routines and Behaviors   Visit Diagnosis: Muscle weakness (generalized)  Hemiplegia and hemiparesis following cerebral infarction affecting left non-dominant side (HCC)  Other lack of coordination    Problem List Patient Active Problem List   Diagnosis Date Noted  . Muscle weakness (generalized) 06/11/2019  . Hemiplegia and hemiparesis following cerebral infarction affecting left non-dominant side ([place]) 06/11/2019  . Major depressive disorder   . Hypokalemia   . Acute blood  loss anemia   . Ulcerative colitis with complication ([place])   . Small vessel cerebrovascular accident (CVA) ([place]) 05/25/2019  . CVA (cerebral vascular accident) ([place]) 05/23/2019  . Thrombocytosis ([place]) 05/23/2019  . Coronary artery disease involving native coronary artery of native heart without angina pectoris 03/21/2018  . Coronary artery calcification 02/10/2018  . Morbid obesity ([place]) 02/10/2018  . Smoker 01/15/2018  . Dyspnea on exertion 12/21/2017  . Carpal tunnel syndrome 12/21/2017  . Stress incontinence 12/21/2017  .  Paresthesia of both feet 07/04/2017  . Always thirsty 07/04/2017  . Abdominal bloating 12/01/2015  . Fatigue 12/01/2015  . Vitamin B12 deficiency 12/01/2015  . Lymphocytic colitis 01/16/2015  . Anemia, iron deficiency 01/16/2015  . Anxiety state 12/20/2014  . Diarrhea 11/27/2014  . Insomnia 11/27/2014  . History of DVT (deep vein thrombosis) 09/17/2014  . OSA (obstructive sleep apnea) 09/02/2014  . Gastritis, chronic 06/06/2014  . Unspecified gastritis and gastroduodenitis without mention of hemorrhage 05/03/2014  . Abdominal pain, epigastric 04/19/2014  . Internal hemorrhoids with other complication 20/80/2233  . Routine general medical examination at a health care facility 10/14/2011  . Thyroid nodule 05/16/2011  . Rectal bleeding 11/24/2010  . Hemorrhoids, internal 11/24/2010  . Irritable bowel syndrome (IBS) 11/24/2010  . ANEMIA 10/02/2009  . Hyperlipidemia 07/18/2009  . Essential hypertension 07/18/2009  . DYSPNEA ON EXERTION 07/18/2009  . THYROID CYST 01/31/2008  . ANXIETY 01/31/2008  . Depression with anxiety 01/31/2008  . GASTRIC ULCER, HX OF 01/31/2008  . FIBROIDS, UTERUS 11/13/2007  . ALLERGIC RHINITIS 11/13/2007  . ASTHMA 11/13/2007  . GERD 11/13/2007  . IRRITABLE BOWEL SYNDROME 11/13/2007  . INTERNAL HEMORRHOIDS 11/12/2003   Zollie Clemence T Tomasita Morrow, OTR/L, CLT  Kaveh Kissinger 06/28/2019, 10:13 AM  [place] MAIN [facility] SERVICES [place], [place], [address] Phone: [phone]   Fax:  [phone]  Name: Carol Johnson MRN: [id number] Date of Birth: July 13, 1966

## 2019-06-28 NOTE — Telephone Encounter (Signed)
Pt requested a refill for Lialda sent to [facility] on [address] in [place].

## 2019-06-28 NOTE — Telephone Encounter (Signed)
Dec 8th at 2:10pm  Patient has an appointment scheduled, discussed with patient that she must keep this appointment to keep receiving lialda refills   Has not been seen since 2017

## 2019-06-29 ENCOUNTER — Ambulatory Visit (INDEPENDENT_AMBULATORY_CARE_PROVIDER_SITE_OTHER): Payer: BC Managed Care – PPO | Admitting: Family Medicine

## 2019-06-29 ENCOUNTER — Encounter: Payer: Self-pay | Admitting: Family Medicine

## 2019-06-29 ENCOUNTER — Other Ambulatory Visit: Payer: Self-pay

## 2019-06-29 VITALS — BP 124/86 | HR 82 | Temp 97.7°F | Ht 63.0 in | Wt 219.3 lb

## 2019-06-29 DIAGNOSIS — E782 Mixed hyperlipidemia: Secondary | ICD-10-CM

## 2019-06-29 DIAGNOSIS — F418 Other specified anxiety disorders: Secondary | ICD-10-CM | POA: Diagnosis not present

## 2019-06-29 DIAGNOSIS — E669 Obesity, unspecified: Secondary | ICD-10-CM

## 2019-06-29 DIAGNOSIS — I10 Essential (primary) hypertension: Secondary | ICD-10-CM

## 2019-06-29 DIAGNOSIS — Z8673 Personal history of transient ischemic attack (TIA), and cerebral infarction without residual deficits: Secondary | ICD-10-CM

## 2019-06-29 DIAGNOSIS — I69354 Hemiplegia and hemiparesis following cerebral infarction affecting left non-dominant side: Secondary | ICD-10-CM

## 2019-06-29 NOTE — Progress Notes (Signed)
Subjective:    Patient ID: Carol Johnson, female    DOB: February 15, 1966, 53 y.o.   MRN: [id number]  HPI Here for f/u after hosp for cva   hosp from 9/25 to 10/7 Diagnosed with small vessel cva   She presented with L sided weakness MRI showed 2 cm acute infarct R internal capsule ext into R basal ganglia  Also poss 2 mm aneurysm of and cereb artery  No carotid occlusion and nl echo  Thought to be a small vessel cva  She was placed on asa and plavix 3 wk then asa alone  Also recommended 30 day cardiac event monitor   Still taking plavix until it runs out  On asa=will stay on that    She was d/c to inpt rehab  At some point also noted L breast lump - ref to gyn  Wt Readings from Last 3 Encounters:  06/29/19 219 lb 5 oz (99.5 kg)  06/19/19 226 lb (102.5 kg)  06/06/19 230 lb 2.6 oz (104.4 kg)  her sister has been helping with meals  Heart healthy now  Lots of exercise with therapy  38.85 kg/m   Making slow progress with home PT/OT now  Just moved from walker to cane  Has a hitch in her gait- L leg   Trying to get to where she can type again with L hand  Getting spasms at night  Worse when she is tired in general   Takes robaxin   Doing very well given what happened   Lab Results  Component Value Date   CREATININE 0.81 06/05/2019   BUN 11 06/05/2019   NA 140 06/05/2019   K 4.1 06/05/2019   CL 105 06/05/2019   CO2 25 06/05/2019   Lab Results  Component Value Date   WBC 7.1 05/26/2019   HGB 11.5 (L) 05/26/2019   HCT 36.1 05/26/2019   MCV 81.7 05/26/2019   PLT 335 05/26/2019    Lab Results  Component Value Date   CHOL 228 (H) 05/24/2019   HDL 32 (L) 05/24/2019   LDLCALC 166 (H) 05/24/2019   LDLDIRECT 193.0 08/04/2017   TRIG 151 (H) 05/24/2019   CHOLHDL 7.1 05/24/2019  taking lipitor 80 mg now  She is tolerating lipitor ok  No problems with that   Has appt with neurology upcoming   Walking outside- too uneven Is grocery shopping with cart   Will  check cholesterol today   Getting a new baby in the family  Nephew is having a baby  Her Tdap is utd 10/28    She is out of work-on short term disability and working towards getting back to work   Still on mesalamine - was out of it for a while- now has it again   Hanging in there emotionally On wellbutrin or celexa    Patient Active Problem List   Diagnosis Date Noted  . Muscle weakness (generalized) 06/11/2019  . Hemiplegia and hemiparesis following cerebral infarction affecting left non-dominant side ([place]) 06/11/2019  . Major depressive disorder   . Hypokalemia   . Acute blood loss anemia   . Ulcerative colitis with complication ([place])   . H/O: CVA (cerebrovascular accident) 05/23/2019  . Thrombocytosis ([place]) 05/23/2019  . Coronary artery disease involving native coronary artery of native heart without angina pectoris 03/21/2018  . Coronary artery calcification 02/10/2018  . Obesity (BMI 30-39.9) 02/10/2018  . Former smoker 01/15/2018  . Dyspnea on exertion 12/21/2017  . Carpal tunnel syndrome 12/21/2017  .  Stress incontinence 12/21/2017  . Paresthesia of both feet 07/04/2017  . Always thirsty 07/04/2017  . Abdominal bloating 12/01/2015  . Fatigue 12/01/2015  . Vitamin B12 deficiency 12/01/2015  . Lymphocytic colitis 01/16/2015  . Anemia, iron deficiency 01/16/2015  . Anxiety state 12/20/2014  . Diarrhea 11/27/2014  . Insomnia 11/27/2014  . History of DVT (deep vein thrombosis) 09/17/2014  . OSA (obstructive sleep apnea) 09/02/2014  . Gastritis, chronic 06/06/2014  . Unspecified gastritis and gastroduodenitis without mention of hemorrhage 05/03/2014  . Abdominal pain, epigastric 04/19/2014  . Internal hemorrhoids with other complication 27/01/2375  . Routine general medical examination at a health care facility 10/14/2011  . Thyroid nodule 05/16/2011  . Rectal bleeding 11/24/2010  . Hemorrhoids, internal 11/24/2010  . Irritable bowel syndrome (IBS) 11/24/2010  .  ANEMIA 10/02/2009  . Hyperlipidemia 07/18/2009  . Essential hypertension 07/18/2009  . DYSPNEA ON EXERTION 07/18/2009  . THYROID CYST 01/31/2008  . ANXIETY 01/31/2008  . Depression with anxiety 01/31/2008  . GASTRIC ULCER, HX OF 01/31/2008  . FIBROIDS, UTERUS 11/13/2007  . ALLERGIC RHINITIS 11/13/2007  . ASTHMA 11/13/2007  . GERD 11/13/2007  . IRRITABLE BOWEL SYNDROME 11/13/2007  . INTERNAL HEMORRHOIDS 11/12/2003   Past Medical History:  Diagnosis Date  . Abdominal pain, unspecified site   . Allergic rhinitis   . Anxiety   . Asthma   . Depression   . DVT (deep venous thrombosis) ([place]) 2016  . Fibroid uterus   . GERD (gastroesophageal reflux disease)   . Hypercholesterolemia   . Hypertension   . Internal hemorrhoid   . Irritable bowel syndrome   . Lymphocytic colitis 01/02/2015  . Pneumonia 1998  . Stroke ([place])   . Ulcerative colitis ([place])   . URI (upper respiratory infection)   . Uterine fibroid   . Vitamin B12 deficiency    Past Surgical History:  Procedure Laterality Date  . CESAREAN SECTION  2004  . CHOLECYSTECTOMY     laparoscopic "gallstones"  . ESOPHAGOGASTRODUODENOSCOPY (EGD) WITH PROPOFOL N/A 05/03/2014   Procedure: ESOPHAGOGASTRODUODENOSCOPY (EGD) WITH PROPOFOL;  Surgeon: Inda Castle, MD;  Location: [facility];  Service: Endoscopy;  Laterality: N/A;  . MYOMECTOMY    . UTERINE FIBROID SURGERY     Social History   Tobacco Use  . Smoking status: Former Smoker    Packs/day: 0.10    Years: 10.00    Pack years: 1.00    Types: Cigarettes    Quit date: 12/07/2017    Years since quitting: 1.5  . Smokeless tobacco: Never Used  Substance Use Topics  . Alcohol use: Yes    Alcohol/week: 5.0 standard drinks    Types: 5 Cans of beer per week  . Drug use: No   Family History  Problem Relation Age of Onset  . Pulmonary fibrosis Father   . Colon cancer Paternal Aunt   . Uterine cancer Paternal Grandmother        with mets to the colon   . Heart failure  Maternal Grandmother   . Heart disease Paternal Uncle   . Heart disease Paternal Aunt   . Kidney disease Cousin   . Hyperlipidemia Mother   . Mitral valve prolapse Mother   . Heart disease Mother        arrhythmia   Allergies  Allergen Reactions  . Sertraline Hcl     REACTION: diarrhea  . Zoloft [Sertraline Hcl] Diarrhea   Current Outpatient Medications on File Prior to Visit  Medication Sig Dispense Refill  .  acetaminophen (TYLENOL) 325 MG tablet Take 2 tablets (650 mg total) by mouth every 4 (four) hours as needed for mild pain (or temp > 37.5 C (99.5 F)).    Marland Kitchen albuterol (PROVENTIL HFA;VENTOLIN HFA) 108 (90 Base) MCG/ACT inhaler Inhale 2 puffs into the lungs every 4 (four) hours as needed for wheezing or shortness of breath (cough, shortness of breath or wheezing.). 1 Inhaler 1  . aspirin EC 81 MG EC tablet Take 1 tablet (81 mg total) by mouth daily. 30 tablet 0  . atorvastatin (LIPITOR) 80 MG tablet Take 1 tablet (80 mg total) by mouth daily at 6 PM. 30 tablet 0  . buPROPion (WELLBUTRIN XL) 300 MG 24 hr tablet Take 1 tablet (300 mg total) by mouth daily. 90 tablet 3  . citalopram (CELEXA) 20 MG tablet Take 1 tablet (20 mg total) by mouth daily. 90 tablet 3  . clopidogrel (PLAVIX) 75 MG tablet Take 1 tablet (75 mg total) by mouth daily. 10 tablet 0  . fexofenadine (ALLEGRA) 180 MG tablet Take 180 mg by mouth daily as needed.     . fluticasone (FLONASE) 50 MCG/ACT nasal spray Place 2 sprays into both nostrils daily. 16 g 6  . loperamide (IMODIUM) 2 MG capsule Take 1 capsule (2 mg total) by mouth as needed for diarrhea or loose stools. 30 capsule 0  . mesalamine (LIALDA) 1.2 g EC tablet Take 2 tablets (2.4 g total) by mouth 2 (two) times daily. 60 tablet 1  . methocarbamol (ROBAXIN) 500 MG tablet Take 1 tablet (500 mg total) by mouth every 6 (six) hours as needed for muscle spasms. 30 tablet 3  . pantoprazole (PROTONIX) 40 MG tablet Take 1 tablet (40 mg total) by mouth daily. 90 tablet 3   . saccharomyces boulardii (FLORASTOR) 250 MG capsule Take 1 capsule (250 mg total) by mouth 2 (two) times daily. 60 capsule 0  . zolpidem (AMBIEN CR) 12.5 MG CR tablet TAKE 1 TABLET (12.5 MG TOTAL) BY MOUTH AT BEDTIME AS NEEDED FOR SLEEP (Patient taking differently: Take 2.25-12.5 mg by mouth at bedtime as needed for sleep. ) 30 tablet 3   No current facility-administered medications on file prior to visit.     Review of Systems  Constitutional: Negative for activity change, appetite change, fatigue, fever and unexpected weight change.  HENT: Negative for congestion, ear pain, rhinorrhea, sinus pressure and sore throat.   Eyes: Negative for pain, redness and visual disturbance.  Respiratory: Negative for cough, shortness of breath and wheezing.   Cardiovascular: Negative for chest pain and palpitations.  Gastrointestinal: Negative for abdominal pain, blood in stool, constipation and diarrhea.  Endocrine: Negative for polydipsia and polyuria.  Genitourinary: Negative for dysuria, frequency and urgency.  Musculoskeletal: Negative for arthralgias, back pain and myalgias.  Skin: Negative for pallor and rash.  Allergic/Immunologic: Negative for environmental allergies.  Neurological: Positive for weakness. Negative for dizziness, syncope, facial asymmetry, speech difficulty, light-headedness, numbness and headaches.  Hematological: Negative for adenopathy. Does not bruise/bleed easily.  Psychiatric/Behavioral: Negative for decreased concentration and dysphoric mood. The patient is not nervous/anxious.        Objective:   Physical Exam Constitutional:      General: She is not in acute distress.    Appearance: Normal appearance. She is well-developed. She is obese. She is not ill-appearing or diaphoretic.  HENT:     Head: Normocephalic and atraumatic.     Comments: No facial droop Speech is normal    Mouth/Throat:  Mouth: Mucous membranes are moist.  Eyes:     General: No scleral  icterus.    Conjunctiva/sclera: Conjunctivae normal.     Pupils: Pupils are equal, round, and reactive to light.  Neck:     Musculoskeletal: Normal range of motion and neck supple.     Thyroid: No thyromegaly.     Vascular: No carotid bruit or JVD.  Cardiovascular:     Rate and Rhythm: Normal rate and regular rhythm.     Heart sounds: Normal heart sounds. No gallop.   Pulmonary:     Effort: Pulmonary effort is normal. No respiratory distress.     Breath sounds: Normal breath sounds. No wheezing or rales.     Comments: Good air exch Abdominal:     General: Bowel sounds are normal. There is no distension or abdominal bruit.     Palpations: Abdomen is soft. There is no mass.     Tenderness: There is no abdominal tenderness.  Musculoskeletal:     Right lower leg: No edema.     Left lower leg: No edema.  Lymphadenopathy:     Cervical: No cervical adenopathy.  Skin:    General: Skin is warm and dry.     Coloration: Skin is not pale.     Findings: No erythema or rash.  Neurological:     Mental Status: She is alert.     Cranial Nerves: No cranial nerve deficit.     Sensory: No sensory deficit.     Motor: Weakness present.     Coordination: Coordination normal.     Deep Tendon Reflexes: Reflexes are normal and symmetric. Reflexes normal.     Comments: Mild L sided weakness noted with gait/using cane  Good grip  No numbness Fine motor L hand movements slowed somewhat Nl speech  Psychiatric:        Mood and Affect: Mood normal.        Cognition and Memory: Cognition and memory normal.           Assessment & Plan:   Problem List Items Addressed This Visit      Cardiovascular and Mediastinum   Essential hypertension    Now s/p small vessel cva bp in fair control at this time  BP Readings from Last 1 Encounters:  06/29/19 124/86   No changes needed Most recent labs reviewed  Disc lifstyle change with low sodium diet and exercise          Nervous and Auditory    Hemiplegia and hemiparesis following cerebral infarction affecting left non-dominant side (H[facility])    Doing better after rehab and with PT/OT at home Wishes to get back to work after fine motor work so she can type         Other   Hyperlipidemia    Now on high dose atorvastatin s/p cva goald LDL of 70 or below  Better diet  Will soon be able to increase exercise  Lipid panel today      Relevant Orders   Lipid panel (Completed)   ALT (Completed)   AST (Completed)   Depression with anxiety    Continues celexa and wellbutrin  Doing well s/p CVA She remains positive Reviewed stressors/ coping techniques/symptoms/ support sources/ tx options and side effects in detail today       Obesity (BMI 30-39.9)    Discussed how this problem influences overall health and the risks it imposes  Reviewed plan for weight loss with lower calorie diet (via better  food choices and also portion control or program like weight watchers) and exercise building up to or more than 30 minutes 5 days per week including some aerobic activity   Wt loss noted  S/p recent cva      H/O: CVA (cerebrovascular accident) - Primary    Recent-small vessel (R internal capsule /R basal ganglia) Reviewed hospital records, lab results and studies in detail  Doing well s/p rehab/getting PT/OT at [facility] for L sided weakness Continues plavix and asa with plan to transition to asa alone  Has upcoming f/u with neurology Wearing heart monitor  Neg carotid studies and echo in the hospital  On high dose statin  bp is well controlled  Wt loss -making progress

## 2019-06-29 NOTE — Patient Instructions (Signed)
Let's check cholesterol today   Keep eating a healthy diet  Keep doing your therapy   Tdap is up up to date   Stop at check out to sign release so I can get a pap and mammogram report   Continue follow up with physical med and rehab Also see neurology as planned

## 2019-06-30 LAB — LIPID PANEL
Cholesterol: 129 mg/dL (ref <200)
HDL: 30 mg/dL — ABNORMAL LOW (ref >=50)
LDL Cholesterol (Calc): 80 mg/dL (calc)
Non-HDL Cholesterol (Calc): 99 mg/dL (calc) (ref <130)
Total CHOL/HDL Ratio: 4.3 (calc) (ref <5.0)
Triglycerides: 103 mg/dL (ref <150)

## 2019-06-30 LAB — AST: AST: 34 U/L (ref 10–35)

## 2019-06-30 LAB — ALT: ALT: 39 U/L — ABNORMAL HIGH (ref 6–29)

## 2019-06-30 NOTE — Assessment & Plan Note (Signed)
Recent-small vessel (R internal capsule /R basal ganglia) Reviewed hospital records, lab results and studies in detail  Doing well s/p rehab/getting PT/OT at [facility] for L sided weakness Continues plavix and asa with plan to transition to asa alone  Has upcoming f/u with neurology Wearing heart monitor  Neg carotid studies and echo in the hospital  On high dose statin  bp is well controlled  Wt loss -making progress

## 2019-06-30 NOTE — Assessment & Plan Note (Signed)
Continues celexa and wellbutrin  Doing well s/p CVA She remains positive Reviewed stressors/ coping techniques/symptoms/ support sources/ tx options and side effects in detail today

## 2019-06-30 NOTE — Assessment & Plan Note (Signed)
Now s/p small vessel cva bp in fair control at this time  BP Readings from Last 1 Encounters:  06/29/19 124/86   No changes needed Most recent labs reviewed  Disc lifstyle change with low sodium diet and exercise

## 2019-06-30 NOTE — Assessment & Plan Note (Signed)
Discussed how this problem influences overall health and the risks it imposes  Reviewed plan for weight loss with lower calorie diet (via better food choices and also portion control or program like weight watchers) and exercise building up to or more than 30 minutes 5 days per week including some aerobic activity   Wt loss noted  S/p recent cva

## 2019-06-30 NOTE — Assessment & Plan Note (Signed)
Doing better after rehab and with PT/OT at home Wishes to get back to work after fine motor work so she can type

## 2019-06-30 NOTE — Assessment & Plan Note (Signed)
Now on high dose atorvastatin s/p cva goald LDL of 70 or below  Better diet  Will soon be able to increase exercise  Lipid panel today

## 2019-07-02 ENCOUNTER — Other Ambulatory Visit: Payer: Self-pay

## 2019-07-02 ENCOUNTER — Ambulatory Visit: Payer: BC Managed Care – PPO | Attending: Physician Assistant

## 2019-07-02 ENCOUNTER — Encounter: Payer: BC Managed Care – PPO | Admitting: Occupational Therapy

## 2019-07-02 DIAGNOSIS — R278 Other lack of coordination: Secondary | ICD-10-CM | POA: Diagnosis not present

## 2019-07-02 DIAGNOSIS — R05 Cough: Secondary | ICD-10-CM | POA: Diagnosis not present

## 2019-07-02 DIAGNOSIS — I639 Cerebral infarction, unspecified: Secondary | ICD-10-CM | POA: Diagnosis not present

## 2019-07-02 DIAGNOSIS — M6281 Muscle weakness (generalized): Secondary | ICD-10-CM | POA: Diagnosis not present

## 2019-07-02 DIAGNOSIS — I69354 Hemiplegia and hemiparesis following cerebral infarction affecting left non-dominant side: Secondary | ICD-10-CM | POA: Insufficient documentation

## 2019-07-02 NOTE — Patient Instructions (Signed)
Access Code: [email or web address]  URL: [email or web address]  Date: 07/02/2019  Prepared by: Lieutenant Diego   Exercises Sit to Stand without Arm Support - 10 reps - 1 sets - 1x daily - 4x weekly Single Leg Heel Raise - 10 reps - 2 sets - 1x daily - 4x weekly Standing Hip Extension - 10 reps - 2 sets - 1x daily - 4x weekly Standing Hip Abduction - 10 reps - 2 sets - 1x daily - 4x weekly Standing March with Counter Support - 10 reps - 2 sets - 1x daily - 4x weekly

## 2019-07-02 NOTE — Therapy (Signed)
[place] MAIN [facility] SERVICES [address] [place], [place], [address] Phone: [phone]   Fax:  [phone]  Physical Therapy Treatment  Patient Details  Name: Carol Johnson MRN: [id number] Date of Birth: 07-04-1966 Referring Provider (PT): Lauraine Rinne   Encounter Date: 07/02/2019  PT End of Session - 07/02/19 1023    Visit Number  2    Number of Visits  25    Date for PT Re-Evaluation  09/19/19    PT Start Time  1024    PT Stop Time  1102    PT Time Calculation (min)  38 min    Equipment Utilized During Treatment  Gait belt    Activity Tolerance  Patient tolerated treatment well    Behavior During Therapy  [facility] for tasks assessed/performed       Past Medical History:  Diagnosis Date  . Abdominal pain, unspecified site   . Allergic rhinitis   . Anxiety   . Asthma   . Depression   . DVT (deep venous thrombosis) ([place]) 2016  . Fibroid uterus   . GERD (gastroesophageal reflux disease)   . Hypercholesterolemia   . Hypertension   . Internal hemorrhoid   . Irritable bowel syndrome   . Lymphocytic colitis 01/02/2015  . Pneumonia 1998  . Stroke ([place])   . Ulcerative colitis ([place])   . URI (upper respiratory infection)   . Uterine fibroid   . Vitamin B12 deficiency     Past Surgical History:  Procedure Laterality Date  . CESAREAN SECTION  2004  . CHOLECYSTECTOMY     laparoscopic "gallstones"  . ESOPHAGOGASTRODUODENOSCOPY (EGD) WITH PROPOFOL N/A 05/03/2014   Procedure: ESOPHAGOGASTRODUODENOSCOPY (EGD) WITH PROPOFOL;  Surgeon: Inda Castle, MD;  Location: [facility];  Service: Endoscopy;  Laterality: N/A;  . MYOMECTOMY    . UTERINE FIBROID SURGERY      There were no vitals filed for this visit.  Subjective Assessment - 07/02/19 1037    Subjective  Patient stated no pain at start of session, reported that at her evaluation she said that next time she would recieve HEP.    Pertinent History  Pt recently discharged from  CIR at [facility] after having a CVA in R corona radiata and basal ganglia and was in CIR from 05/25/19-06/06/19.    Currently in Pain?  No/denies          TREATMENT:  Therapeutic exercise:   Ambulation in hall 69f x2 no AD, cues for heel strike, decreased LUE arm swing, trunk rotation noted  Ambulation in hall with horizontal head turns, vertical head turns [id number] ea direction of head turns   CGA-minA provided for safety due to mild unsteadiness noted, cues for heel strike   Standing hip extension and hip abduction with BUE support 2x10 bilaterally, cues for upright posture/technique   Unilateral heel raises with BUE; BUE support increased to maximize LLE ROM, decreased from RLE to maximize balance challenge  NMR: Standing on foam feet together, EO, EC, horizontal head turns, vertical head turns 2x30sec ea , CGa, pt challenged by uneven surface, reported feeling nauseous/ has motion sickness.  Sit to stands x10 standard chair, equal weight bearing, eccentric control cues. No UE support   Pt response/clinical impression: Session focused on updating HEP, pt needed demo and multimodal cues for all, administered handout, no further HEP questions at this time. CGA-minA provided for balance activities this session. PT also adjusted SPC to appropriate height for proper use. The  patient would benefit from further skilled PT to continue to progress program as able to maximize safety, mobility, and to decrease risk of falls.     PT Education - 07/02/19 1037    Education Details  HEP, exercise form/technique    Person(s) Educated  Patient    Methods  Explanation;Demonstration;Verbal cues;Tactile cues    Comprehension  Returned demonstration;Verbal cues required;Need further instruction;Verbalized understanding;Tactile cues required       PT Short Term Goals - 06/27/19 1411      PT SHORT TERM GOAL #1   Title  Patient will be independent in home exercise program to improve  strength/mobility for better functional independence with ADLs.    Time  6    Period  Weeks    Status  New    Target Date  08/08/19      PT SHORT TERM GOAL #2   Title  Patient (< 40 years old) will complete five times sit to stand test in < 10 seconds indicating an increased LE strength and improved balance.    Time  6    Period  Weeks    Status  New    Target Date  08/08/19        PT Long Term Goals - 06/27/19 1412      PT LONG TERM GOAL #1   Title  Patient will increase Berg Balance score by > 6 points to demonstrate decreased fall risk during functional activities.    Time  12    Period  Weeks    Status  New    Target Date  09/19/19      PT LONG TERM GOAL #2   Title  Patient will increase six minute walk test distance to >1200 for progression to community ambulator and improve gait ability    Time  12    Period  Weeks    Status  New    Target Date  09/19/19      PT LONG TERM GOAL #3   Title  Patient will increase 10 meter walk test to >1.44ms as to improve gait speed for better community ambulation and to reduce fall risk    Time  12    Period  Weeks    Status  New    Target Date  09/19/19      PT LONG TERM GOAL #4   Title  Patient will reduce timed up and go to <11 seconds to reduce fall risk and demonstrate improved transfer/gait ability.    Time  12    Period  Weeks    Status  New    Target Date  09/19/19            Plan - 07/02/19 1110    Clinical Impression Statement  Session focused on updating HEP, pt needed demo and multimodal cues for all, administered handout, no further HEP questions at this time. CGA-minA provided for balance activities this session. PT also adjusted SPC to appropriate height for proper use. The patient would benefit from further skilled PT to continue to progress program as able to maximize safety, mobility, and to decrease risk of falls.    Examination-Activity Limitations  Carry    Examination-Participation Restrictions   Driving    Rehab Potential  Good    PT Frequency  2x / week    PT Duration  12 weeks    PT Treatment/Interventions  Gait training;Stair training;Neuromuscular re-education;Balance training;Therapeutic exercise;Manual techniques;Dry needling;Passive range of motion    PT  Next Visit Plan  continue to progress program    PT Home Exercise Plan  HEP updated this session, HEP code: [id number]    Consulted and Agree with Plan of Care  Patient       Patient will benefit from skilled therapeutic intervention in order to improve the following deficits and impairments:  Abnormal gait, Decreased strength, Decreased activity tolerance  Visit Diagnosis: Muscle weakness (generalized)  Other lack of coordination  Hemiplegia and hemiparesis following cerebral infarction affecting left non-dominant side (HCC)  Small vessel cerebrovascular accident (CVA) [facility])     Problem List Patient Active Problem List   Diagnosis Date Noted  . Muscle weakness (generalized) 06/11/2019  . Hemiplegia and hemiparesis following cerebral infarction affecting left non-dominant side ([place]) 06/11/2019  . Major depressive disorder   . Hypokalemia   . Acute blood loss anemia   . Ulcerative colitis with complication ([place])   . H/O: CVA (cerebrovascular accident) 05/23/2019  . Thrombocytosis ([place]) 05/23/2019  . Coronary artery disease involving native coronary artery of native heart without angina pectoris 03/21/2018  . Coronary artery calcification 02/10/2018  . Obesity (BMI 30-39.9) 02/10/2018  . Former smoker 01/15/2018  . Dyspnea on exertion 12/21/2017  . Carpal tunnel syndrome 12/21/2017  . Stress incontinence 12/21/2017  . Paresthesia of both feet 07/04/2017  . Always thirsty 07/04/2017  . Abdominal bloating 12/01/2015  . Fatigue 12/01/2015  . Vitamin B12 deficiency 12/01/2015  . Lymphocytic colitis 01/16/2015  . Anemia, iron deficiency 01/16/2015  . Anxiety state 12/20/2014  . Diarrhea 11/27/2014  .  Insomnia 11/27/2014  . History of DVT (deep vein thrombosis) 09/17/2014  . OSA (obstructive sleep apnea) 09/02/2014  . Gastritis, chronic 06/06/2014  . Unspecified gastritis and gastroduodenitis without mention of hemorrhage 05/03/2014  . Abdominal pain, epigastric 04/19/2014  . Internal hemorrhoids with other complication 16/05/9603  . Routine general medical examination at a health care facility 10/14/2011  . Thyroid nodule 05/16/2011  . Rectal bleeding 11/24/2010  . Hemorrhoids, internal 11/24/2010  . Irritable bowel syndrome (IBS) 11/24/2010  . ANEMIA 10/02/2009  . Hyperlipidemia 07/18/2009  . Essential hypertension 07/18/2009  . DYSPNEA ON EXERTION 07/18/2009  . THYROID CYST 01/31/2008  . ANXIETY 01/31/2008  . Depression with anxiety 01/31/2008  . GASTRIC ULCER, HX OF 01/31/2008  . FIBROIDS, UTERUS 11/13/2007  . ALLERGIC RHINITIS 11/13/2007  . ASTHMA 11/13/2007  . GERD 11/13/2007  . IRRITABLE BOWEL SYNDROME 11/13/2007  . INTERNAL HEMORRHOIDS 11/12/2003    Lieutenant Diego PT, DPT 11:15 AM,07/02/19 [place] MAIN [facility] SERVICES [place], [place], [address] Phone: [phone]   Fax:  [phone]  Name: Carol Johnson MRN: [id number] Date of Birth: 1966-02-13

## 2019-07-04 ENCOUNTER — Ambulatory Visit: Payer: BC Managed Care – PPO | Admitting: Occupational Therapy

## 2019-07-04 ENCOUNTER — Encounter: Payer: Self-pay | Admitting: Occupational Therapy

## 2019-07-04 ENCOUNTER — Other Ambulatory Visit: Payer: Self-pay

## 2019-07-04 DIAGNOSIS — I639 Cerebral infarction, unspecified: Secondary | ICD-10-CM | POA: Diagnosis not present

## 2019-07-04 DIAGNOSIS — I69354 Hemiplegia and hemiparesis following cerebral infarction affecting left non-dominant side: Secondary | ICD-10-CM | POA: Diagnosis not present

## 2019-07-04 DIAGNOSIS — R05 Cough: Secondary | ICD-10-CM | POA: Diagnosis not present

## 2019-07-04 DIAGNOSIS — R278 Other lack of coordination: Secondary | ICD-10-CM | POA: Diagnosis not present

## 2019-07-04 DIAGNOSIS — M6281 Muscle weakness (generalized): Secondary | ICD-10-CM

## 2019-07-04 NOTE — Therapy (Signed)
[place] MAIN [facility] SERVICES [address] [place], [place], [address] Phone: [phone]   Fax:  [phone]  Occupational Therapy Treatment  Patient Details  Name: Carol Johnson MRN: [id number] Date of Birth: [birth year] Referring Provider (OT): Dr Loura Pardon at [facility]   Encounter Date: 07/04/2019  OT End of Session - 07/04/19 2017    Visit Number  7    Number of Visits  24    Date for OT Re-Evaluation  08/16/19    Authorization Type  [employer]    OT Start Time  1126    OT Stop Time  1210    OT Time Calculation (min)  44 min    Activity Tolerance  Patient tolerated treatment well    Behavior During Therapy  [facility] for tasks assessed/performed       Past Medical History:  Diagnosis Date  . Abdominal pain, unspecified site   . Allergic rhinitis   . Anxiety   . Asthma   . Depression   . DVT (deep venous thrombosis) ([place]) 2016  . Fibroid uterus   . GERD (gastroesophageal reflux disease)   . Hypercholesterolemia   . Hypertension   . Internal hemorrhoid   . Irritable bowel syndrome   . Lymphocytic colitis 01/02/2015  . Pneumonia 1998  . Stroke ([place])   . Ulcerative colitis ([place])   . URI (upper respiratory infection)   . Uterine fibroid   . Vitamin B12 deficiency     Past Surgical History:  Procedure Laterality Date  . CESAREAN SECTION  2004  . CHOLECYSTECTOMY     laparoscopic "gallstones"  . ESOPHAGOGASTRODUODENOSCOPY (EGD) WITH PROPOFOL N/A 05/03/2014   Procedure: ESOPHAGOGASTRODUODENOSCOPY (EGD) WITH PROPOFOL;  Surgeon: Inda Castle, MD;  [facility];  Service: Endoscopy;  Laterality: N/A;  . MYOMECTOMY    . UTERINE FIBROID SURGERY      There were no vitals filed for this visit.  Subjective Assessment - 07/04/19 2016    Subjective   Patient states, "I am sorry I was late, I had to wait for my son to bring me."    Pertinent History  Pt recently discharged from [place] at [facility] after having a CVA in R corona  radiata and basal ganglia and was in CIR from 05/25/19-06/06/19.    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    Currently in Pain?  No/denies    Pain Score  0-No pain        Neuromuscular Reeducation: Patient seen for tasks to focus on proprioception of left hand.  Task required reaching to pick up objects from left side place with a barrier and unable to utilize vision to complete task.  Directed to pick up certain number of objects at a time and place them into board working on manipulation skills, using the hand for storage and translatory skills of the left hand with cues for prehension patterns.   Patient seen for manipulation of small objects less than 1/2 inch in size with tip to tip prehension and sorting into container.       Therapeutic Exercises: Patient seen for resistive pinch skills for lateral, 2 point and 3 point pinches combined with reaching patterns to place resistive pins onto yardstick placed in vertical position to encourage  graduated reach. Cues for pinch patterns, patient tends to utilize a  full grasp rather than pinch unless directed by therapist.   Response to tx:   Patient reports improved proprioception of left hand and starting to realize more of where her left hand is in space when she is doing tasks throughout the day.  She did have some difficulties with right/left discrimination and may be something to look at further in upcoming sessions. Some difficulty with lateral pinch with black resistive pins and tended to use full grasping pattern on left.  Patient becoming more efficient in picking up small objects, speed and dexterity improving.  Continue to work towards goals to improve left UE functional use to increase independence in daily tasks.              OT Education - 07/04/19 2017    Education Details  fine motor coordination,  reaching patterns    Person(s) Educated  Patient    Methods  Explanation;Demonstration    Comprehension  Verbalized understanding;Returned demonstration;Need further instruction       OT Short Term Goals - 06/26/19 0839      OT SHORT TERM GOAL #1   Title  Pt will be able to use L hand with R for zippers and buttons for upper body dressing independently    Baseline  pt is unable to complete    Time  12    Period  Weeks    Status  New    Target Date  08/16/19      OT SHORT TERM GOAL #2   Title  Pt will increase strength in L hand to 34# for use during ADLs.    Baseline  grip strength 28#    Time  12    Period  Weeks    Status  New    Target Date  08/16/19      OT SHORT TERM GOAL #3   Title  Pt will increase fine motor skills in L hand to complete 9 hole peg test in 30 seconds.    Baseline  45 seconds is needed for 9 hold peg test.    Time  12    Period  Weeks    Status  New    Target Date  08/16/19      OT SHORT TERM GOAL #4   Title  Pt will complete typing test using L hand with R on laptop with no more than 5 errors.    Baseline  pt is unable to use L hand to type    Time  12    Period  Weeks    Status  New    Target Date  08/16/19      OT SHORT TERM GOAL #5   Title  Pt will incorporate energy conservation tech during ADLs and IADLs.    Baseline  Pt is currently not using.    Time  12    Period  Weeks    Status  New    Target Date  08/16/19      OT SHORT TERM GOAL #6   Title  Pt will be educated in HEP for LUE strength and coordination skills training.    Baseline  pt does not have one currently but was given theraputty exercises with written exercises today.    Time  12    Period  Weeks    Status  New    Target Date  08/16/19               Plan - 07/04/19 2017  Clinical Impression Statement  Patient reports improved proprioception of left hand and starting to realize more of where her left hand is in space when she is doing tasks throughout the  day.  She did have some difficulties with right/left discrimination and may be something to look at further in upcoming sessions. Some difficulty with lateral pinch with black resistive pins and tended to use full grasping pattern on left.  Patient becoming more efficient in picking up small objects, speed and dexterity improving.  Continue to work towards goals to improve left UE functional use to increase independence in daily tasks.    OT Occupational Profile and History  Problem Focused Assessment - Including review of records relating to presenting problem    Occupational performance deficits (Please refer to evaluation for details):  ADL's;IADL's;Work;Leisure    Body Structure / Function / Physical Skills  ADL;Dexterity;Balance;Coordination;FMC;Tone;UE functional use;Endurance;IADL    Psychosocial Skills  Coping Strategies;Routines and Behaviors    Clinical Decision Making  Several treatment options, min-mod task modification necessary    Comorbidities Affecting Occupational Performance:  May have comorbidities impacting occupational performance    Modification or Assistance to Complete Evaluation   Min-Moderate modification of tasks or assist with assess necessary to complete eval    OT Frequency  2x / week    OT Duration  12 weeks    OT Treatment/Interventions  Self-care/ADL training;Therapeutic exercise;Balance training;Psychosocial skills training;Coping strategies training;Therapeutic activities;Neuromuscular education;DME and/or AE instruction;Patient/family education    OT Home Exercise Plan  Rec she continue typing at home on keyboard and is currrently up to 20 minutes.    Consulted and Agree with Plan of Care  Patient       Patient will benefit from skilled therapeutic intervention in order to improve the following deficits and impairments:   Body Structure / Function / Physical Skills: ADL, Dexterity, Balance, Coordination, FMC, Tone, UE functional use, Endurance, IADL    Psychosocial Skills: Coping Strategies, Routines and Behaviors   Visit Diagnosis: Muscle weakness (generalized)  Other lack of coordination  Hemiplegia and hemiparesis following cerebral infarction affecting left non-dominant side [facility])    Problem List Patient Active Problem List   Diagnosis Date Noted  . Muscle weakness (generalized) 06/11/2019  . Hemiplegia and hemiparesis following cerebral infarction affecting left non-dominant side ([place]) 06/11/2019  . Major depressive disorder   . Hypokalemia   . Acute blood loss anemia   . Ulcerative colitis with complication ([place])   . H/O: CVA (cerebrovascular accident) 05/23/2019  . Thrombocytosis ([place]) 05/23/2019  . Coronary artery disease involving native coronary artery of native heart without angina pectoris 03/21/2018  . Coronary artery calcification 02/10/2018  . Obesity (BMI 30-39.9) 02/10/2018  . Former smoker 01/15/2018  . Dyspnea on exertion 12/21/2017  . Carpal tunnel syndrome 12/21/2017  . Stress incontinence 12/21/2017  . Paresthesia of both feet 07/04/2017  . Always thirsty 07/04/2017  . Abdominal bloating 12/01/2015  . Fatigue 12/01/2015  . Vitamin B12 deficiency 12/01/2015  . Lymphocytic colitis 01/16/2015  . Anemia, iron deficiency 01/16/2015  . Anxiety state 12/20/2014  . Diarrhea 11/27/2014  . Insomnia 11/27/2014  . History of DVT (deep vein thrombosis) 09/17/2014  . OSA (obstructive sleep apnea) 09/02/2014  . Gastritis, chronic 06/06/2014  . Unspecified gastritis and gastroduodenitis without mention of hemorrhage 05/03/2014  . Abdominal pain, epigastric 04/19/2014  . Internal hemorrhoids with other complication 24/82/5003  . Routine general medical examination at a health care facility 10/14/2011  . Thyroid nodule 05/16/2011  . Rectal bleeding  11/24/2010  . Hemorrhoids, internal 11/24/2010  . Irritable bowel syndrome (IBS) 11/24/2010  . ANEMIA 10/02/2009  . Hyperlipidemia 07/18/2009  . Essential  hypertension 07/18/2009  . DYSPNEA ON EXERTION 07/18/2009  . THYROID CYST 01/31/2008  . ANXIETY 01/31/2008  . Depression with anxiety 01/31/2008  . GASTRIC ULCER, HX OF 01/31/2008  . FIBROIDS, UTERUS 11/13/2007  . ALLERGIC RHINITIS 11/13/2007  . ASTHMA 11/13/2007  . GERD 11/13/2007  . IRRITABLE BOWEL SYNDROME 11/13/2007  . INTERNAL HEMORRHOIDS 11/12/2003   Amy T Tomasita Morrow, OTR/L, CLT  Lovett,Amy 07/04/2019, 10:31 PM  [place] MAIN [facility] SERVICES [address] [place], [place], [address] Phone: [phone]   Fax:  [phone]  Name: Carol Johnson MRN: [id number] Date of Birth: 22-Jul-1966

## 2019-07-05 ENCOUNTER — Other Ambulatory Visit: Payer: Self-pay

## 2019-07-05 ENCOUNTER — Ambulatory Visit: Payer: BC Managed Care – PPO | Admitting: Occupational Therapy

## 2019-07-05 ENCOUNTER — Encounter: Payer: Self-pay | Admitting: Occupational Therapy

## 2019-07-05 ENCOUNTER — Encounter: Payer: Self-pay | Admitting: Physical Therapy

## 2019-07-05 ENCOUNTER — Ambulatory Visit: Payer: BC Managed Care – PPO | Admitting: Physical Therapy

## 2019-07-05 DIAGNOSIS — M6281 Muscle weakness (generalized): Secondary | ICD-10-CM | POA: Diagnosis not present

## 2019-07-05 DIAGNOSIS — R278 Other lack of coordination: Secondary | ICD-10-CM

## 2019-07-05 DIAGNOSIS — R05 Cough: Secondary | ICD-10-CM | POA: Diagnosis not present

## 2019-07-05 DIAGNOSIS — I69354 Hemiplegia and hemiparesis following cerebral infarction affecting left non-dominant side: Secondary | ICD-10-CM | POA: Diagnosis not present

## 2019-07-05 DIAGNOSIS — I639 Cerebral infarction, unspecified: Secondary | ICD-10-CM | POA: Diagnosis not present

## 2019-07-05 NOTE — Therapy (Signed)
[place] MAIN [facility] SERVICES [address] [place], [place], [address] Phone: [phone]   Fax:  [phone]  Occupational Therapy Treatment  Patient Details  Name: Carol Johnson MRN: [id number] Date of Birth: [birth year] Referring Provider (OT): Dr Loura Pardon at [facility]   Encounter Date: 07/05/2019  OT End of Session - 07/05/19 1028    Visit Number  8    Number of Visits  24    Date for OT Re-Evaluation  08/16/19    Authorization Type  [employer]    OT Start Time  1018    OT Stop Time  1100    OT Time Calculation (min)  42 min    Equipment Utilized During Treatment  green theraputty    Activity Tolerance  Patient tolerated treatment well    Behavior During Therapy  [facility] for tasks assessed/performed       Past Medical History:  Diagnosis Date  . Abdominal pain, unspecified site   . Allergic rhinitis   . Anxiety   . Asthma   . Depression   . DVT (deep venous thrombosis) ([place]) 2016  . Fibroid uterus   . GERD (gastroesophageal reflux disease)   . Hypercholesterolemia   . Hypertension   . Internal hemorrhoid   . Irritable bowel syndrome   . Lymphocytic colitis 01/02/2015  . Pneumonia 1998  . Stroke ([place])   . Ulcerative colitis ([place])   . URI (upper respiratory infection)   . Uterine fibroid   . Vitamin B12 deficiency     Past Surgical History:  Procedure Laterality Date  . CESAREAN SECTION  2004  . CHOLECYSTECTOMY     laparoscopic "gallstones"  . ESOPHAGOGASTRODUODENOSCOPY (EGD) WITH PROPOFOL N/A 05/03/2014   Procedure: ESOPHAGOGASTRODUODENOSCOPY (EGD) WITH PROPOFOL;  Surgeon: Inda Castle, MD;  Location: [facility];  Service: Endoscopy;  Laterality: N/A;  . MYOMECTOMY    . UTERINE FIBROID SURGERY      There were no vitals filed for this visit.  Subjective Assessment - 07/05/19 1024    Subjective   Pt. reports having had a long day yesterday.    Patient is accompanied by:  Family member    Pertinent History  Pt  recently discharged from [place] at [facility] after having a CVA in R corona radiata and basal ganglia and was in CIR from 05/25/19-06/06/19.    Currently in Pain?  No/denies      OT TREATMENT    Neuro muscular re-education:  Pt. worked on grasping, and manipulating 1/2", 3/4", and 1" washers from a magnetic dish positioned at the left. Pt. worked on reaching up, stabilizing, and sustaining shoulder elevation while placing the washer over a small precise target on vertical dowels positioned at various angles.   Selfcare:  Pt. Performed typing tasks with focus on incorporating the left 3rd, 4th, and 5th digits. 5 min. Typing test 24 wpm, with 95% accuracy, 654 characters. Pt. Worked on Typing training word tasks with 97% , and Tying sentence simulator with 94% Accuracy  Response to Treatment:  Pt. is making steady progress, and is improving with [facility] skills, speed, dexterity, and grasping items positioned to the left. Pt. continues to have difficulty incorporating her left hand 4th, and 5th digits duirng typing. Pt. continues to work on improving LUE functioning during ADLs, IADL tasks, and typing in preparation for work related typing tasks.  OT Education - 07/05/19 1028    Education Details  fine motor coordination, reaching patterns    Person(s) Educated  Patient    Methods  Explanation;Demonstration    Comprehension  Verbalized understanding;Returned demonstration;Need further instruction       OT Short Term Goals - 06/26/19 0839      OT SHORT TERM GOAL #1   Title  Pt will be able to use L hand with R for zippers and buttons for upper body dressing independently    Baseline  pt is unable to complete    Time  12    Period  Weeks    Status  New    Target Date  08/16/19      OT SHORT TERM GOAL #2   Title  Pt will increase strength in L hand to 34# for use during ADLs.    Baseline  grip strength 28#    Time  12    Period  Weeks    Status   New    Target Date  08/16/19      OT SHORT TERM GOAL #3   Title  Pt will increase fine motor skills in L hand to complete 9 hole peg test in 30 seconds.    Baseline  45 seconds is needed for 9 hold peg test.    Time  12    Period  Weeks    Status  New    Target Date  08/16/19      OT SHORT TERM GOAL #4   Title  Pt will complete typing test using L hand with R on laptop with no more than 5 errors.    Baseline  pt is unable to use L hand to type    Time  12    Period  Weeks    Status  New    Target Date  08/16/19      OT SHORT TERM GOAL #5   Title  Pt will incorporate energy conservation tech during ADLs and IADLs.    Baseline  Pt is currently not using.    Time  12    Period  Weeks    Status  New    Target Date  08/16/19      OT SHORT TERM GOAL #6   Title  Pt will be educated in HEP for LUE strength and coordination skills training.    Baseline  pt does not have one currently but was given theraputty exercises with written exercises today.    Time  12    Period  Weeks    Status  New    Target Date  08/16/19               Plan - 07/05/19 1030    Clinical Impression Statement Pt. is making steady progress, and is improving with [facility] skills, speed, dexterity, and grasping items positioned to the left. Pt. continues to have difficulty incorporating her left hand 4th, and 5th digits duirng typing. Pt. continues to work on improving LUE functioning during ADLs, IADL tasks, and typing in preparation for work related typing tasks.    Occupational performance deficits (Please refer to evaluation for details):  ADL's;IADL's;Work;Leisure    Body Structure / Function / Physical Skills  ADL;Dexterity;Balance;Coordination;FMC;Tone;UE functional use;Endurance;IADL    Psychosocial Skills  Optometrist and Behaviors    Rehab Potential  Excellent    Clinical Decision Making  Several treatment options, min-mod task modification necessary    Comorbidities Affecting  Occupational  Performance:  May have comorbidities impacting occupational performance    Modification or Assistance to Complete Evaluation   Min-Moderate modification of tasks or assist with assess necessary to complete eval    OT Frequency  2x / week    OT Duration  12 weeks    OT Treatment/Interventions  Self-care/ADL training;Therapeutic exercise;Balance training;Psychosocial skills training;Coping strategies training;Therapeutic activities;Neuromuscular education;DME and/or AE instruction;Patient/family education    OT Home Exercise Plan  Rec she continue typing at home on keyboard and is currrently up to 20 minutes.    Consulted and Agree with Plan of Care  Patient       Patient will benefit from skilled therapeutic intervention in order to improve the following deficits and impairments:   Body Structure / Function / Physical Skills: ADL, Dexterity, Balance, Coordination, FMC, Tone, UE functional use, Endurance, IADL   Psychosocial Skills: Coping Strategies, Routines and Behaviors   Visit Diagnosis: Muscle weakness (generalized)  Other lack of coordination    Problem List Patient Active Problem List   Diagnosis Date Noted  . Muscle weakness (generalized) 06/11/2019  . Hemiplegia and hemiparesis following cerebral infarction affecting left non-dominant side ([place]) 06/11/2019  . Major depressive disorder   . Hypokalemia   . Acute blood loss anemia   . Ulcerative colitis with complication ([place])   . H/O: CVA (cerebrovascular accident) 05/23/2019  . Thrombocytosis ([place]) 05/23/2019  . Coronary artery disease involving native coronary artery of native heart without angina pectoris 03/21/2018  . Coronary artery calcification 02/10/2018  . Obesity (BMI 30-39.9) 02/10/2018  . Former smoker 01/15/2018  . Dyspnea on exertion 12/21/2017  . Carpal tunnel syndrome 12/21/2017  . Stress incontinence 12/21/2017  . Paresthesia of both feet 07/04/2017  . Always thirsty 07/04/2017  .  Abdominal bloating 12/01/2015  . Fatigue 12/01/2015  . Vitamin B12 deficiency 12/01/2015  . Lymphocytic colitis 01/16/2015  . Anemia, iron deficiency 01/16/2015  . Anxiety state 12/20/2014  . Diarrhea 11/27/2014  . Insomnia 11/27/2014  . History of DVT (deep vein thrombosis) 09/17/2014  . OSA (obstructive sleep apnea) 09/02/2014  . Gastritis, chronic 06/06/2014  . Unspecified gastritis and gastroduodenitis without mention of hemorrhage 05/03/2014  . Abdominal pain, epigastric 04/19/2014  . Internal hemorrhoids with other complication 86/38/1771  . Routine general medical examination at a health care facility 10/14/2011  . Thyroid nodule 05/16/2011  . Rectal bleeding 11/24/2010  . Hemorrhoids, internal 11/24/2010  . Irritable bowel syndrome (IBS) 11/24/2010  . ANEMIA 10/02/2009  . Hyperlipidemia 07/18/2009  . Essential hypertension 07/18/2009  . DYSPNEA ON EXERTION 07/18/2009  . THYROID CYST 01/31/2008  . ANXIETY 01/31/2008  . Depression with anxiety 01/31/2008  . GASTRIC ULCER, HX OF 01/31/2008  . FIBROIDS, UTERUS 11/13/2007  . ALLERGIC RHINITIS 11/13/2007  . ASTHMA 11/13/2007  . GERD 11/13/2007  . IRRITABLE BOWEL SYNDROME 11/13/2007  . INTERNAL HEMORRHOIDS 11/12/2003    Harrel Carina, MS, OTR/L 07/05/2019, 12:38 PM  [place] MAIN [facility] SERVICES [address] [place], [place], [address] Phone: [phone]   Fax:  [phone]  Name: Carol Johnson MRN: [id number] Date of Birth: 1966-07-06

## 2019-07-05 NOTE — Therapy (Signed)
[place] MAIN [facility] SERVICES [address] [place], [place], [address] Phone: [phone]   Fax:  [phone]  Physical Therapy Treatment  Patient Details  Name: Carol Johnson MRN: [id number] Date of Birth: July 17, 1966 Referring Provider (PT): Lauraine Rinne   Encounter Date: 07/05/2019  PT End of Session - 07/05/19 0950    Visit Number  3    Number of Visits  25    Date for PT Re-Evaluation  09/19/19    PT Start Time  0936    PT Stop Time  1015    PT Time Calculation (min)  39 min    Equipment Utilized During Treatment  Gait belt    Activity Tolerance  Patient tolerated treatment well    Behavior During Therapy  [facility] for tasks assessed/performed       Past Medical History:  Diagnosis Date  . Abdominal pain, unspecified site   . Allergic rhinitis   . Anxiety   . Asthma   . Depression   . DVT (deep venous thrombosis) ([place]) 2016  . Fibroid uterus   . GERD (gastroesophageal reflux disease)   . Hypercholesterolemia   . Hypertension   . Internal hemorrhoid   . Irritable bowel syndrome   . Lymphocytic colitis 01/02/2015  . Pneumonia 1998  . Stroke ([place])   . Ulcerative colitis ([place])   . URI (upper respiratory infection)   . Uterine fibroid   . Vitamin B12 deficiency     Past Surgical History:  Procedure Laterality Date  . CESAREAN SECTION  2004  . CHOLECYSTECTOMY     laparoscopic "gallstones"  . ESOPHAGOGASTRODUODENOSCOPY (EGD) WITH PROPOFOL N/A 05/03/2014   Procedure: ESOPHAGOGASTRODUODENOSCOPY (EGD) WITH PROPOFOL;  Surgeon: Inda Castle, MD;  [facility];  Service: Endoscopy;  Laterality: N/A;  . MYOMECTOMY    . UTERINE FIBROID SURGERY      There were no vitals filed for this visit.  Subjective Assessment - 07/05/19 0940    Subjective  Patient reports some soreness in right knee which she states is from OA and has been there before.Patient reports, "I have noticed when I try to walk my left toes are curling  which is throwing off my balance."    Pertinent History  Pt recently discharged from [place] at [facility] after having a CVA in R corona radiata and basal ganglia and was in CIR from 05/25/19-06/06/19.    Currently in Pain?  Yes    Pain Score  3     Pain Location  Knee    Pain Orientation  Right;Medial    Pain Descriptors / Indicators  Aching;Sore    Pain Type  Chronic pain    Pain Onset  More than a month ago    Pain Frequency  Intermittent    Aggravating Factors   worse with weight bearing/standing    Pain Relieving Factors  rest    Effect of Pain on Daily Activities  decreased activity tolerance;         [facility] PT Assessment - 07/05/19 0001      6 Minute Walk- Baseline   6 Minute Walk- Baseline  yes    BP (mmHg)  124/82    HR (bpm)  83    02 Sat (%RA)  99 %      6 Minute walk- Post Test   6 Minute Walk Post Test  yes    BP (mmHg)  139/83    HR (bpm)  99  02 Sat (%RA)  98 %      6 minute walk test results    Aerobic Endurance Distance Walked  610    Endurance additional comments  with SPC, <1000 feet indicates limited community ambulator            TREATMENT: Patient instructed in 6 min walk test to address functional gait ability.  TREATMENT:  Therapeutic exercise:  Standing heel off step calf stretch 20 sec hold x2 reps bilaterally with B rail assist;   Standing with red tband around BLE: Hip abduction 2x10 bilaterally; Hip extension 2x10 bilaterally; Pt requires rail assist for balance and min VCs to avoid trunk lean for better hip strengthening and muscle activation;    NMR: Standing on foam feet together,  EO/EC, 15 sec hold unsupported x3 reps each with CGA and cues for better weight shift to midline for improved stance control; Standing on foam:  Modified tandem stance:   Unsupported standing 15 sec hold x1 rep each foot in front;    Head turns side/side, up/down x5 reps each with each foot in front   Requires CGA for safety and cues for better  weight shift and upper trunk control for improved stance control;  Standing on foam: Alternate toe taps to 6 inch step with 2-1-0 rail assist x15 reps bilaterally with cues to slow down weight shift and improve hip/knee extension on stance leg for better stance control; Standing one foot on airex, one foot on 6 inch step, unsupported BUE ball pass side/side x5 reps each direction with CGA for safety and cues to improve erect posture for better stance control;    Pt response: Pt tolerated well. She does report increased fatigue at end of session. She requires CGA to min A with advanced balance exercise especially with less rail assist while standing on compliant surfaces. She reports increased soreness in low back with prolonged standing requiring intermittent seated rest breaks;  Patient also complained of increased toe curling with prolonged standing/balance exercise. Instructed patient in heel off step calf stretch to challenge ankle flexibility and reduce toe curling;  Patient continues to have increased foot drag with prolonged ambulation. Reinforced HEP, patient verbalized understanding.                 PT Education - 07/05/19 0950    Education Details  LE strength, balance/HEP reinforced;    Person(s) Educated  Patient    Methods  Explanation;Verbal cues    Comprehension  Verbalized understanding;Returned demonstration;Verbal cues required;Need further instruction       PT Short Term Goals - 06/27/19 1411      PT SHORT TERM GOAL #1   Title  Patient will be independent in home exercise program to improve strength/mobility for better functional independence with ADLs.    Time  6    Period  Weeks    Status  New    Target Date  08/08/19      PT SHORT TERM GOAL #2   Title  Patient (< 21 years old) will complete five times sit to stand test in < 10 seconds indicating an increased LE strength and improved balance.    Time  6    Period  Weeks    Status  New    Target Date   08/08/19        PT Long Term Goals - 06/27/19 1412      PT LONG TERM GOAL #1   Title  Patient will increase Oceanographer  score by > 6 points to demonstrate decreased fall risk during functional activities.    Time  12    Period  Weeks    Status  New    Target Date  09/19/19      PT LONG TERM GOAL #2   Title  Patient will increase six minute walk test distance to >1200 for progression to community ambulator and improve gait ability    Time  12    Period  Weeks    Status  New    Target Date  09/19/19      PT LONG TERM GOAL #3   Title  Patient will increase 10 meter walk test to >1.33ms as to improve gait speed for better community ambulation and to reduce fall risk    Time  12    Period  Weeks    Status  New    Target Date  09/19/19      PT LONG TERM GOAL #4   Title  Patient will reduce timed up and go to <11 seconds to reduce fall risk and demonstrate improved transfer/gait ability.    Time  12    Period  Weeks    Status  New    Target Date  09/19/19            Plan - 07/05/19 1009    Clinical Impression Statement  Patient motivated and participated well within session; She does fatigue with prolonged standing with increased left toe curling and slight back discomfort requiring intermittent seated rest breaks. Patient requires min VCs for proper exercise technique including improving erect posture and weight shift for better stance control. CGA-min A requiring throughout session for safety; She would benefit from additional skilled PT intervention to improve strength, balance and gait safety;    Examination-Activity Limitations  Carry    Examination-Participation Restrictions  Driving    Rehab Potential  Good    PT Frequency  2x / week    PT Duration  12 weeks    PT Treatment/Interventions  Gait training;Stair training;Neuromuscular re-education;Balance training;Therapeutic exercise;Manual techniques;Dry needling;Passive range of motion    PT Next Visit Plan  continue  to progress program    PT Home Exercise Plan  HEP updated this session, HEP code: [id number]    Consulted and Agree with Plan of Care  Patient       Patient will benefit from skilled therapeutic intervention in order to improve the following deficits and impairments:  Abnormal gait, Decreased strength, Decreased activity tolerance  Visit Diagnosis: Muscle weakness (generalized)  Other lack of coordination     Problem List Patient Active Problem List   Diagnosis Date Noted  . Muscle weakness (generalized) 06/11/2019  . Hemiplegia and hemiparesis following cerebral infarction affecting left non-dominant side ([place] 06/11/2019  . Major depressive disorder   . Hypokalemia   . Acute blood loss anemia   . Ulcerative colitis with complication ([place]   . H/O: CVA (cerebrovascular accident) 05/23/2019  . Thrombocytosis ([place] 05/23/2019  . Coronary artery disease involving native coronary artery of native heart without angina pectoris 03/21/2018  . Coronary artery calcification 02/10/2018  . Obesity (BMI 30-39.9) 02/10/2018  . Former smoker 01/15/2018  . Dyspnea on exertion 12/21/2017  . Carpal tunnel syndrome 12/21/2017  . Stress incontinence 12/21/2017  . Paresthesia of both feet 07/04/2017  . Always thirsty 07/04/2017  . Abdominal bloating 12/01/2015  . Fatigue 12/01/2015  . Vitamin B12 deficiency 12/01/2015  . Lymphocytic colitis 01/16/2015  . Anemia, iron  deficiency 01/16/2015  . Anxiety state 12/20/2014  . Diarrhea 11/27/2014  . Insomnia 11/27/2014  . History of DVT (deep vein thrombosis) 09/17/2014  . OSA (obstructive sleep apnea) 09/02/2014  . Gastritis, chronic 06/06/2014  . Unspecified gastritis and gastroduodenitis without mention of hemorrhage 05/03/2014  . Abdominal pain, epigastric 04/19/2014  . Internal hemorrhoids with other complication 63/84/6659  . Routine general medical examination at a health care facility 10/14/2011  . Thyroid nodule 05/16/2011  . Rectal  bleeding 11/24/2010  . Hemorrhoids, internal 11/24/2010  . Irritable bowel syndrome (IBS) 11/24/2010  . ANEMIA 10/02/2009  . Hyperlipidemia 07/18/2009  . Essential hypertension 07/18/2009  . DYSPNEA ON EXERTION 07/18/2009  . THYROID CYST 01/31/2008  . ANXIETY 01/31/2008  . Depression with anxiety 01/31/2008  . GASTRIC ULCER, HX OF 01/31/2008  . FIBROIDS, UTERUS 11/13/2007  . ALLERGIC RHINITIS 11/13/2007  . ASTHMA 11/13/2007  . GERD 11/13/2007  . IRRITABLE BOWEL SYNDROME 11/13/2007  . INTERNAL HEMORRHOIDS 11/12/2003    Latori Beggs PT, DPT 07/05/2019, 10:13 AM  [place] MAIN [facility] SERVICES [address] [place], [place], [address] Phone: [phone]   Fax:  [phone]  Name: Carol Johnson MRN: [id number] Date of Birth: Jun 02, 1966

## 2019-07-07 DIAGNOSIS — R531 Weakness: Secondary | ICD-10-CM | POA: Diagnosis not present

## 2019-07-07 DIAGNOSIS — I639 Cerebral infarction, unspecified: Secondary | ICD-10-CM | POA: Diagnosis not present

## 2019-07-09 ENCOUNTER — Ambulatory Visit: Payer: BC Managed Care – PPO | Admitting: Occupational Therapy

## 2019-07-09 ENCOUNTER — Encounter: Payer: Self-pay | Admitting: Physical Therapy

## 2019-07-09 ENCOUNTER — Encounter: Payer: Self-pay | Admitting: Occupational Therapy

## 2019-07-09 ENCOUNTER — Other Ambulatory Visit: Payer: Self-pay

## 2019-07-09 ENCOUNTER — Ambulatory Visit: Payer: BC Managed Care – PPO | Admitting: Physical Therapy

## 2019-07-09 DIAGNOSIS — M6281 Muscle weakness (generalized): Secondary | ICD-10-CM

## 2019-07-09 DIAGNOSIS — R05 Cough: Secondary | ICD-10-CM | POA: Diagnosis not present

## 2019-07-09 DIAGNOSIS — R278 Other lack of coordination: Secondary | ICD-10-CM

## 2019-07-09 DIAGNOSIS — I69354 Hemiplegia and hemiparesis following cerebral infarction affecting left non-dominant side: Secondary | ICD-10-CM

## 2019-07-09 DIAGNOSIS — I639 Cerebral infarction, unspecified: Secondary | ICD-10-CM | POA: Diagnosis not present

## 2019-07-09 NOTE — Therapy (Signed)
[place] MAIN [facility] SERVICES [address] [place], [place], [address] Phone: [phone]   Fax:  [phone]  Physical Therapy Treatment  Patient Details  Name: Carol Johnson MRN: [id number] Date of Birth: 02-23-1966 Referring Provider (PT): Lauraine Rinne   Encounter Date: 07/09/2019  PT End of Session - 07/09/19 1150    Visit Number  4    Number of Visits  25    Date for PT Re-Evaluation  09/19/19    PT Start Time  1146    PT Stop Time  1230    PT Time Calculation (min)  44 min    Equipment Utilized During Treatment  Gait belt    Activity Tolerance  Patient tolerated treatment well    Behavior During Therapy  [facility] for tasks assessed/performed       Past Medical History:  Diagnosis Date  . Abdominal pain, unspecified site   . Allergic rhinitis   . Anxiety   . Asthma   . Depression   . DVT (deep venous thrombosis) ([place]) 2016  . Fibroid uterus   . GERD (gastroesophageal reflux disease)   . Hypercholesterolemia   . Hypertension   . Internal hemorrhoid   . Irritable bowel syndrome   . Lymphocytic colitis 01/02/2015  . Pneumonia 1998  . Stroke ([place])   . Ulcerative colitis ([place])   . URI (upper respiratory infection)   . Uterine fibroid   . Vitamin B12 deficiency     Past Surgical History:  Procedure Laterality Date  . CESAREAN SECTION  2004  . CHOLECYSTECTOMY     laparoscopic "gallstones"  . ESOPHAGOGASTRODUODENOSCOPY (EGD) WITH PROPOFOL N/A 05/03/2014   Procedure: ESOPHAGOGASTRODUODENOSCOPY (EGD) WITH PROPOFOL;  Surgeon: Inda Castle, MD;  Location: [facility];  Service: Endoscopy;  Laterality: N/A;  . MYOMECTOMY    . UTERINE FIBROID SURGERY      There were no vitals filed for this visit.  Subjective Assessment - 07/09/19 1149    Subjective  Patient reports feeling okay today; She reports her right leg was killing her last night but is okay today; She reports that she hasn't had a lot of time to do her HEP as she  moved from sister house to brothers house. She reports being tired after packing and moving; She reports her brothers house has stairs and so she had to go up/down the stairs a lot over the weekend which could have contributed to the pain;    Pertinent History  Pt recently discharged from [place] [facility] after having a CVA in R corona radiata and basal ganglia and was in CIR from 05/25/19-06/06/19.    Currently in Pain?  No/denies    Pain Onset  More than a month ago    Multiple Pain Sites  No             TREATMENT:  Warm up on cross trainer level 0 x4 min (Unbilled) Therapeutic exercise:  Standing heel off step calf stretch 20 sec hold x2 reps bilaterally with B rail assist;    Standing with red tband around BLE: Hip abduction 2x10 bilaterally; Hip extension 2x10 bilaterally; Pt requires rail assist for balance and min VCs to avoid trunk lean for better hip strengthening and muscle activation;   Seated ankle DF red tband 2x20 bilaterally with cues for proper positioning including to keep foot in neutral and avoid ankle IV for better ankle stability and strengthening;   Leg press: BLE leg press 90# x 12  with reports of increased right knee pain; patient required min VCs for proper positioning and exercise technique; Exercise discontinued due to knee discomfort;    NMR: Standing on foam feet together,  EO/EC, 30 sec hold unsupported x3 reps each with CGA and cues for better weight shift to midline for improved stance control; Standing on foam:             Modified tandem stance:                         Unsupported standing 15 sec hold x1 rep each foot in front;                          Head turns side/side, up/down x5 reps each with each foot in front                         Requires CGA for safety and cues for better weight shift and upper trunk control for improved stance control;   Standing on foam: Feet apart, heel/toe raises x1 rail assist x15 reps with cues for proper  positioning and to increase ankle ROM for better ankle strategies;  Alternate toe taps to 6 inch step with 2-1-0 rail assist x15 reps bilaterally with cues to slow down weight shift and improve hip/knee extension on stance leg for better stance control; Standing one foot on airex, one foot on 6 inch step, unsupported BUE ball pass side/side x5 reps each direction with CGA for safety and cues to improve erect posture for better stance control;    Pt response: Pt tolerated fair. She does report increased fatigue at end of session. She requires CGA to min A with advanced balance exercise especially with less rail assist while standing on compliant surfaces. She reports increased soreness in right leg with prolonged standing requiring intermittent seated rest breaks;  Patient also complained of increased toe curling with prolonged standing/balance exercise. Instructed patient in heel off step calf stretch to challenge ankle flexibility and reduce toe curling;  Reinforced HEP, patient verbalized understanding.                        PT Short Term Goals - 06/27/19 1411      PT SHORT TERM GOAL #1   Title  Patient will be independent in home exercise program to improve strength/mobility for better functional independence with ADLs.    Time  6    Period  Weeks    Status  New    Target Date  08/08/19      PT SHORT TERM GOAL #2   Title  Patient (< 54 years old) will complete five times sit to stand test in < 10 seconds indicating an increased LE strength and improved balance.    Time  6    Period  Weeks    Status  New    Target Date  08/08/19        PT Long Term Goals - 06/27/19 1412      PT LONG TERM GOAL #1   Title  Patient will increase Berg Balance score by > 6 points to demonstrate decreased fall risk during functional activities.    Time  12    Period  Weeks    Status  New    Target Date  09/19/19      PT LONG TERM GOAL #2  Title  Patient will increase six minute  walk test distance to >1200 for progression to community ambulator and improve gait ability    Time  12    Period  Weeks    Status  New    Target Date  09/19/19      PT LONG TERM GOAL #3   Title  Patient will increase 10 meter walk test to >1.46ms as to improve gait speed for better community ambulation and to reduce fall risk    Time  12    Period  Weeks    Status  New    Target Date  09/19/19      PT LONG TERM GOAL #4   Title  Patient will reduce timed up and go to <11 seconds to reduce fall risk and demonstrate improved transfer/gait ability.    Time  12    Period  Weeks    Status  New    Target Date  09/19/19            Plan - 07/09/19 1215    Clinical Impression Statement  Patient tolerated session fair. She did complain of increased right knee pain throughout session requiring short seated rest breaks. She reports this pain is related to OA and was probably exacerbated after moving over the weekend having to go up/down steps frequently. Instructed patient in advanced balance exercise, utilizing compliant surfaces to challenge stance control; Patient does require CGA to min A with all balance exercise for safety. She requires min VCs for proper exercise technique to improve motor control and strengthening. Patient would benefit from additional skilled PT intervention to improve strength, balance and gait safety;    Examination-Activity Limitations  Carry    Examination-Participation Restrictions  Driving    Rehab Potential  Good    PT Frequency  2x / week    PT Duration  12 weeks    PT Treatment/Interventions  Gait training;Stair training;Neuromuscular re-education;Balance training;Therapeutic exercise;Manual techniques;Dry needling;Passive range of motion    PT Next Visit Plan  continue to progress program    PT Home Exercise Plan  HEP updated this session, HEP code: [id number]    Consulted and Agree with Plan of Care  Patient       Patient will benefit from skilled  therapeutic intervention in order to improve the following deficits and impairments:  Abnormal gait, Decreased strength, Decreased activity tolerance  Visit Diagnosis: Muscle weakness (generalized)  Other lack of coordination     Problem List Patient Active Problem List   Diagnosis Date Noted  . Muscle weakness (generalized) 06/11/2019  . Hemiplegia and hemiparesis following cerebral infarction affecting left non-dominant side ([place] 06/11/2019  . Major depressive disorder   . Hypokalemia   . Acute blood loss anemia   . Ulcerative colitis with complication ([place]   . H/O: CVA (cerebrovascular accident) 05/23/2019  . Thrombocytosis ([place] 05/23/2019  . Coronary artery disease involving native coronary artery of native heart without angina pectoris 03/21/2018  . Coronary artery calcification 02/10/2018  . Obesity (BMI 30-39.9) 02/10/2018  . Former smoker 01/15/2018  . Dyspnea on exertion 12/21/2017  . Carpal tunnel syndrome 12/21/2017  . Stress incontinence 12/21/2017  . Paresthesia of both feet 07/04/2017  . Always thirsty 07/04/2017  . Abdominal bloating 12/01/2015  . Fatigue 12/01/2015  . Vitamin B12 deficiency 12/01/2015  . Lymphocytic colitis 01/16/2015  . Anemia, iron deficiency 01/16/2015  . Anxiety state 12/20/2014  . Diarrhea 11/27/2014  . Insomnia 11/27/2014  . History of DVT (  deep vein thrombosis) 09/17/2014  . OSA (obstructive sleep apnea) 09/02/2014  . Gastritis, chronic 06/06/2014  . Unspecified gastritis and gastroduodenitis without mention of hemorrhage 05/03/2014  . Abdominal pain, epigastric 04/19/2014  . Internal hemorrhoids with other complication 62/82/4175  . Routine general medical examination at a health care facility 10/14/2011  . Thyroid nodule 05/16/2011  . Rectal bleeding 11/24/2010  . Hemorrhoids, internal 11/24/2010  . Irritable bowel syndrome (IBS) 11/24/2010  . ANEMIA 10/02/2009  . Hyperlipidemia 07/18/2009  . Essential hypertension  07/18/2009  . DYSPNEA ON EXERTION 07/18/2009  . THYROID CYST 01/31/2008  . ANXIETY 01/31/2008  . Depression with anxiety 01/31/2008  . GASTRIC ULCER, HX OF 01/31/2008  . FIBROIDS, UTERUS 11/13/2007  . ALLERGIC RHINITIS 11/13/2007  . ASTHMA 11/13/2007  . GERD 11/13/2007  . IRRITABLE BOWEL SYNDROME 11/13/2007  . INTERNAL HEMORRHOIDS 11/12/2003    Norissa Bartee PT, DPT 07/09/2019, 12:25 PM  [place] MAIN [facility] SERVICES [address] [place], [place], [address] Phone: [phone]   Fax:  [phone]  Name: Carol Johnson MRN: [id number] Date of Birth: January 31, 1966

## 2019-07-09 NOTE — Therapy (Signed)
[place] MAIN [facility] SERVICES [address] [place], [place], [address] Phone: [phone]   Fax:  [phone]  Occupational Therapy Treatment  Patient Details  Name: Carol Johnson MRN: [id number] Date of Birth: 07-19-1966 Referring Provider (OT): Dr Loura Pardon at [facility]   Encounter Date: 07/09/2019  OT End of Session - 07/11/19 2008    Visit Number  9    Number of Visits  24    Date for OT Re-Evaluation  08/16/19    Authorization Type  [employer]    OT Start Time  1110    OT Stop Time  1145    OT Time Calculation (min)  35 min    Activity Tolerance  Patient tolerated treatment well    Behavior During Therapy  [facility] for tasks assessed/performed       Past Medical History:  Diagnosis Date  . Abdominal pain, unspecified site   . Allergic rhinitis   . Anxiety   . Asthma   . Depression   . DVT (deep venous thrombosis) ([place]) 2016  . Fibroid uterus   . GERD (gastroesophageal reflux disease)   . Hypercholesterolemia   . Hypertension   . Internal hemorrhoid   . Irritable bowel syndrome   . Lymphocytic colitis 01/02/2015  . Pneumonia 1998  . Stroke ([place])   . Ulcerative colitis ([place])   . URI (upper respiratory infection)   . Uterine fibroid   . Vitamin B12 deficiency     Past Surgical History:  Procedure Laterality Date  . CESAREAN SECTION  2004  . CHOLECYSTECTOMY     laparoscopic "gallstones"  . ESOPHAGOGASTRODUODENOSCOPY (EGD) WITH PROPOFOL N/A 05/03/2014   Procedure: ESOPHAGOGASTRODUODENOSCOPY (EGD) WITH PROPOFOL;  Surgeon: Inda Castle, MD;  Location: [facility];  Service: Endoscopy;  Laterality: N/A;  . MYOMECTOMY    . UTERINE FIBROID SURGERY      There were no vitals filed for this visit.  Subjective Assessment - 07/11/19 2007    Subjective   Patient reports she is doing well, she moved back to her brother's house over the weekend, which is where she was living when she had her stroke.  Her mom also lives there in the  apartment over the garage.  Patient reports she is a bit sore from trying to do a lot of packing, moving and cleaning.    Pertinent History  Pt recently discharged from CIR at [facility] after having a CVA in R corona radiata and basal ganglia and was in CIR from 05/25/19-06/06/19.    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    Currently in Pain?  No/denies    Pain Score  0-No pain    Pain Onset  Yesterday          Therapeutic Exercise:  Reasssessment of grip and pinch skills today see above Patient seen for use of 1# weight reaching for shape tower 4 levels forwards and back.  Rest breaks as needed. Cues for weight shifting to reach top level.   Neuromuscular Reeducation: Manipulation of Small earring  Backs with tweezers unable to feed onto small wire due to decreased vision, worked on sorting pile of mixed types of backs and sorting into container with use of tweezers to complete task.  Cues for sorting due to difficulty with vision.  Cues for prehension patterns of fingers  onto tweezers.  Dropping items frequently.    Response to tx:   Session shortened due to late arrival and another therapy following OT session.  Patient continues to progress with strengthening tasks.  Difficulty with vision to see details of small objects and requires assist with sorting items.  Frequently dropping of items when performing tweezers but responds well to cues.  She would benefit from continued focus on hand function to improve home and work tasks.  Improvements noted with measurements taken as noted in flowsheet above.   Reassess typing skills next session.                 OT Education - 07/11/19 2008    Education Details  reassessment of grip, pinch and coordination, progress    Person(s) Educated  Patient    Methods  Explanation;Demonstration    Comprehension  Verbalized  understanding;Returned demonstration;Need further instruction       OT Short Term Goals - 06/26/19 0839      OT SHORT TERM GOAL #1   Title  Pt will be able to use L hand with R for zippers and buttons for upper body dressing independently    Baseline  pt is unable to complete    Time  12    Period  Weeks    Status  New    Target Date  08/16/19      OT SHORT TERM GOAL #2   Title  Pt will increase strength in L hand to 34# for use during ADLs.    Baseline  grip strength 28#    Time  12    Period  Weeks    Status  New    Target Date  08/16/19      OT SHORT TERM GOAL #3   Title  Pt will increase fine motor skills in L hand to complete 9 hole peg test in 30 seconds.    Baseline  45 seconds is needed for 9 hold peg test.    Time  12    Period  Weeks    Status  New    Target Date  08/16/19      OT SHORT TERM GOAL #4   Title  Pt will complete typing test using L hand with R on laptop with no more than 5 errors.    Baseline  pt is unable to use L hand to type    Time  12    Period  Weeks    Status  New    Target Date  08/16/19      OT SHORT TERM GOAL #5   Title  Pt will incorporate energy conservation tech during ADLs and IADLs.    Baseline  Pt is currently not using.    Time  12    Period  Weeks    Status  New    Target Date  08/16/19      OT SHORT TERM GOAL #6   Title  Pt will be educated in HEP for LUE strength and coordination skills training.    Baseline  pt does not have one currently but was given theraputty exercises with written exercises today.    Time  12    Period  Weeks    Status  New    Target Date  08/16/19               Plan - 07/11/19 2009    Clinical Impression Statement  Session shortened due to late arrival  and another therapy following OT session.  Patient continues to progress with strengthening tasks.  Difficulty with vision to see details of small objects and requires assist with sorting items.  Frequently dropping of items when  performing tweezers but responds well to cues.  She would benefit from continued focus on hand function to improve home and work tasks.  Improvements noted with measurements taken as noted in flowsheet above.   Reassess typing skills next session.    OT Occupational Profile and History  Problem Focused Assessment - Including review of records relating to presenting problem    Occupational performance deficits (Please refer to evaluation for details):  ADL's;IADL's;Work;Leisure    Body Structure / Function / Physical Skills  ADL;Dexterity;Balance;Coordination;FMC;Tone;UE functional use;Endurance;IADL    Psychosocial Skills  Optometrist and Behaviors    Rehab Potential  Excellent    Clinical Decision Making  Several treatment options, min-mod task modification necessary    Comorbidities Affecting Occupational Performance:  May have comorbidities impacting occupational performance    Modification or Assistance to Complete Evaluation   Min-Moderate modification of tasks or assist with assess necessary to complete eval    OT Frequency  2x / week    OT Duration  12 weeks    OT Treatment/Interventions  Self-care/ADL training;Therapeutic exercise;Balance training;Psychosocial skills training;Coping strategies training;Therapeutic activities;Neuromuscular education;DME and/or AE instruction;Patient/family education    Consulted and Agree with Plan of Care  Patient       Patient will benefit from skilled therapeutic intervention in order to improve the following deficits and impairments:   Body Structure / Function / Physical Skills: ADL, Dexterity, Balance, Coordination, FMC, Tone, UE functional use, Endurance, IADL   Psychosocial Skills: Coping Strategies, Routines and Behaviors   Visit Diagnosis: Muscle weakness (generalized)  Other lack of coordination  Hemiplegia and hemiparesis following cerebral infarction affecting left non-dominant side [facility])    Problem List Patient  Active Problem List   Diagnosis Date Noted  . Muscle weakness (generalized) 06/11/2019  . Hemiplegia and hemiparesis following cerebral infarction affecting left non-dominant side ([place]) 06/11/2019  . Major depressive disorder   . Hypokalemia   . Acute blood loss anemia   . Ulcerative colitis with complication ([place])   . H/O: CVA (cerebrovascular accident) 05/23/2019  . Thrombocytosis ([place]) 05/23/2019  . Coronary artery disease involving native coronary artery of native heart without angina pectoris 03/21/2018  . Coronary artery calcification 02/10/2018  . Obesity (BMI 30-39.9) 02/10/2018  . Former smoker 01/15/2018  . Dyspnea on exertion 12/21/2017  . Carpal tunnel syndrome 12/21/2017  . Stress incontinence 12/21/2017  . Paresthesia of both feet 07/04/2017  . Always thirsty 07/04/2017  . Abdominal bloating 12/01/2015  . Fatigue 12/01/2015  . Vitamin B12 deficiency 12/01/2015  . Lymphocytic colitis 01/16/2015  . Anemia, iron deficiency 01/16/2015  . Anxiety state 12/20/2014  . Diarrhea 11/27/2014  . Insomnia 11/27/2014  . History of DVT (deep vein thrombosis) 09/17/2014  . OSA (obstructive sleep apnea) 09/02/2014  . Gastritis, chronic 06/06/2014  . Unspecified gastritis and gastroduodenitis without mention of hemorrhage 05/03/2014  . Abdominal pain, epigastric 04/19/2014  . Internal hemorrhoids with other complication 80/88/1103  . Routine general medical examination at a health care facility 10/14/2011  . Thyroid nodule 05/16/2011  . Rectal bleeding 11/24/2010  . Hemorrhoids, internal 11/24/2010  . Irritable bowel syndrome (IBS) 11/24/2010  . ANEMIA 10/02/2009  . Hyperlipidemia 07/18/2009  . Essential hypertension 07/18/2009  . DYSPNEA ON EXERTION 07/18/2009  . THYROID CYST 01/31/2008  . ANXIETY 01/31/2008  .  Depression with anxiety 01/31/2008  . GASTRIC ULCER, HX OF 01/31/2008  . FIBROIDS, UTERUS 11/13/2007  . ALLERGIC RHINITIS 11/13/2007  . ASTHMA 11/13/2007  . GERD  11/13/2007  . IRRITABLE BOWEL SYNDROME 11/13/2007  . INTERNAL HEMORRHOIDS 11/12/2003   Carol Johnson, OTR/L, CLT  Carol Johnson 07/11/2019, 8:17 PM  [place] MAIN [facility] SERVICES [address] [place], [place], [address] Phone: [phone]   Fax:  [phone]  Name: MARLI DIEGO MRN: [id number] Date of Birth: [birth year]

## 2019-07-11 ENCOUNTER — Encounter: Payer: BC Managed Care – PPO | Admitting: Occupational Therapy

## 2019-07-12 ENCOUNTER — Ambulatory Visit: Payer: BC Managed Care – PPO

## 2019-07-12 ENCOUNTER — Ambulatory Visit: Payer: BC Managed Care – PPO | Admitting: Occupational Therapy

## 2019-07-12 ENCOUNTER — Other Ambulatory Visit: Payer: Self-pay

## 2019-07-12 DIAGNOSIS — R278 Other lack of coordination: Secondary | ICD-10-CM | POA: Diagnosis not present

## 2019-07-12 DIAGNOSIS — I69354 Hemiplegia and hemiparesis following cerebral infarction affecting left non-dominant side: Secondary | ICD-10-CM | POA: Diagnosis not present

## 2019-07-12 DIAGNOSIS — M6281 Muscle weakness (generalized): Secondary | ICD-10-CM

## 2019-07-12 DIAGNOSIS — I639 Cerebral infarction, unspecified: Secondary | ICD-10-CM | POA: Diagnosis not present

## 2019-07-12 DIAGNOSIS — R05 Cough: Secondary | ICD-10-CM | POA: Diagnosis not present

## 2019-07-12 NOTE — Therapy (Signed)
[place] MAIN [facility] SERVICES [address] [place], [place], [address] Phone: [phone]   Fax:  [phone]  Physical Therapy Treatment  Patient Details  Name: Carol Johnson MRN: [id number] Date of Birth: 25-Nov-1965 Referring Provider (PT): Lauraine Rinne   Encounter Date: 07/12/2019  PT End of Session - 07/12/19 1208    Visit Number  5    Number of Visits  25    Date for PT Re-Evaluation  09/19/19    PT Start Time  1114    PT Stop Time  1145    PT Time Calculation (min)  31 min    Equipment Utilized During Treatment  Gait belt    Activity Tolerance  Patient tolerated treatment well    Behavior During Therapy  [facility] for tasks assessed/performed       Past Medical History:  Diagnosis Date  . Abdominal pain, unspecified site   . Allergic rhinitis   . Anxiety   . Asthma   . Depression   . DVT (deep venous thrombosis) ([place]) 2016  . Fibroid uterus   . GERD (gastroesophageal reflux disease)   . Hypercholesterolemia   . Hypertension   . Internal hemorrhoid   . Irritable bowel syndrome   . Lymphocytic colitis 01/02/2015  . Pneumonia 1998  . Stroke ([place])   . Ulcerative colitis ([place])   . URI (upper respiratory infection)   . Uterine fibroid   . Vitamin B12 deficiency     Past Surgical History:  Procedure Laterality Date  . CESAREAN SECTION  2004  . CHOLECYSTECTOMY     laparoscopic "gallstones"  . ESOPHAGOGASTRODUODENOSCOPY (EGD) WITH PROPOFOL N/A 05/03/2014   Procedure: ESOPHAGOGASTRODUODENOSCOPY (EGD) WITH PROPOFOL;  Surgeon: Inda Castle, MD;  Location: [facility];  Service: Endoscopy;  Laterality: N/A;  . MYOMECTOMY    . UTERINE FIBROID SURGERY      There were no vitals filed for this visit.  Subjective Assessment - 07/12/19 1138    Subjective  Patient reports no pain today, no falls since last session. Has been having difficulty with stairs, has knee pain at night not in the morning.    Pertinent History  Pt  recently discharged from CIR at [facility] after having a CVA in R corona radiata and basal ganglia and was in CIR from 05/25/19-06/06/19.    Currently in Pain?  No/denies    Pain Onset  More than a month ago         Airexpad: ; one foot on airex pad one foot on 7" step 30 second holds, 2x each LE  Standing with # 2.5 ankle weight: CGA for stability  -Hip extension with bilateral upper extremity support, cueing for neutral hip alignment, upright posture for optimal muscle recruitment, and sequencing, 10x each LE,  -Hip abduction with single upper extremity support, cueing for neutral foot alignment for correct muscle activation, 10x each LE -Hip flexion with single upper extremity support, cueing for body mechanics, speed of muscle recruitment for optimal strengthening and stabilization 10x each LE -Hamstring curl with single upper extremity support, cueing for knee alignment for recruitment of hamstring musculature, 10x each LE   Seated with # 2.5  ankle weights  -Seated marches with upright posture, back away from back of chair for abdominal/trunk activation/stabilization, 10x each LE -Seated LAQ with 3 second holds, 10x each LE, cueing for muscle activation and sequencing for neutral alignment -Seated IR/ER with cueing for stabilizing knee placement with lateral foot movement for optimal  muscle recruitment, 10x each LE   stair negation: use of SPC for eccentric control to reduce pain in R knee.     Access Code: [email or web address]  URL: [email or web address]  Date: 07/12/2019  Prepared by: Janna Arch   Exercises Seated Hamstring Stretch - 2 reps - 2 sets - 60 hold - 1x daily - 7x weekly Standing Hip Flexor Stretch - 2 reps - 2 sets - 60 hold - 1x daily - 7x weekly                  PT Education - 07/12/19 1147    Education Details  exercise technique, body mechanics, stair negotions    Person(s) Educated  Patient    Methods   Explanation;Demonstration;Tactile cues;Verbal cues    Comprehension  Verbalized understanding;Returned demonstration;Verbal cues required;Tactile cues required       PT Short Term Goals - 06/27/19 1411      PT SHORT TERM GOAL #1   Title  Patient will be independent in home exercise program to improve strength/mobility for better functional independence with ADLs.    Time  6    Period  Weeks    Status  New    Target Date  08/08/19      PT SHORT TERM GOAL #2   Title  Patient (< 76 years old) will complete five times sit to stand test in < 10 seconds indicating an increased LE strength and improved balance.    Time  6    Period  Weeks    Status  New    Target Date  08/08/19        PT Long Term Goals - 06/27/19 1412      PT LONG TERM GOAL #1   Title  Patient will increase Berg Balance score by > 6 points to demonstrate decreased fall risk during functional activities.    Time  12    Period  Weeks    Status  New    Target Date  09/19/19      PT LONG TERM GOAL #2   Title  Patient will increase six minute walk test distance to >1200 for progression to community ambulator and improve gait ability    Time  12    Period  Weeks    Status  New    Target Date  09/19/19      PT LONG TERM GOAL #3   Title  Patient will increase 10 meter walk test to >1.50ms as to improve gait speed for better community ambulation and to reduce fall risk    Time  12    Period  Weeks    Status  New    Target Date  09/19/19      PT LONG TERM GOAL #4   Title  Patient will reduce timed up and go to <11 seconds to reduce fall risk and demonstrate improved transfer/gait ability.    Time  12    Period  Weeks    Status  New    Target Date  09/19/19            Plan - 07/12/19 1211    Clinical Impression Statement  Patient presents slightly late to PT session limiting session duration. Fatigues quickly with standing interventions. Patient requires min VCs for proper exercise technique including  improving erect posture and weight shift for better stance control. CGA-min A requiring throughout session for safety; She would benefit from additional skilled PT intervention to improve strength, balance and  gait safety.    Examination-Activity Limitations  Carry    Examination-Participation Restrictions  Driving    Rehab Potential  Good    PT Frequency  2x / week    PT Duration  12 weeks    PT Treatment/Interventions  Gait training;Stair training;Neuromuscular re-education;Balance training;Therapeutic exercise;Manual techniques;Dry needling;Passive range of motion    PT Next Visit Plan  continue to progress program    PT Home Exercise Plan  HEP updated this session, HEP code: [email or web address]    Consulted and Agree with Plan of Care  Patient       Patient will benefit from skilled therapeutic intervention in order to improve the following deficits and impairments:  Abnormal gait, Decreased strength, Decreased activity tolerance  Visit Diagnosis: Muscle weakness (generalized)  Other lack of coordination  Hemiplegia and hemiparesis following cerebral infarction affecting left non-dominant side [facility])     Problem List Patient Active Problem List   Diagnosis Date Noted  . Muscle weakness (generalized) 06/11/2019  . Hemiplegia and hemiparesis following cerebral infarction affecting left non-dominant side ([place]) 06/11/2019  . Major depressive disorder   . Hypokalemia   . Acute blood loss anemia   . Ulcerative colitis with complication ([place])   . H/O: CVA (cerebrovascular accident) 05/23/2019  . Thrombocytosis ([place]) 05/23/2019  . Coronary artery disease involving native coronary artery of native heart without angina pectoris 03/21/2018  . Coronary artery calcification 02/10/2018  . Obesity (BMI 30-39.9) 02/10/2018  . Former smoker 01/15/2018  . Dyspnea on exertion 12/21/2017  . Carpal tunnel syndrome 12/21/2017  . Stress incontinence 12/21/2017  . Paresthesia of both feet 07/04/2017  .  Always thirsty 07/04/2017  . Abdominal bloating 12/01/2015  . Fatigue 12/01/2015  . Vitamin B12 deficiency 12/01/2015  . Lymphocytic colitis 01/16/2015  . Anemia, iron deficiency 01/16/2015  . Anxiety state 12/20/2014  . Diarrhea 11/27/2014  . Insomnia 11/27/2014  . History of DVT (deep vein thrombosis) 09/17/2014  . OSA (obstructive sleep apnea) 09/02/2014  . Gastritis, chronic 06/06/2014  . Unspecified gastritis and gastroduodenitis without mention of hemorrhage 05/03/2014  . Abdominal pain, epigastric 04/19/2014  . Internal hemorrhoids with other complication 32/44/0102  . Routine general medical examination at a health care facility 10/14/2011  . Thyroid nodule 05/16/2011  . Rectal bleeding 11/24/2010  . Hemorrhoids, internal 11/24/2010  . Irritable bowel syndrome (IBS) 11/24/2010  . ANEMIA 10/02/2009  . Hyperlipidemia 07/18/2009  . Essential hypertension 07/18/2009  . DYSPNEA ON EXERTION 07/18/2009  . THYROID CYST 01/31/2008  . ANXIETY 01/31/2008  . Depression with anxiety 01/31/2008  . GASTRIC ULCER, HX OF 01/31/2008  . FIBROIDS, UTERUS 11/13/2007  . ALLERGIC RHINITIS 11/13/2007  . ASTHMA 11/13/2007  . GERD 11/13/2007  . IRRITABLE BOWEL SYNDROME 11/13/2007  . INTERNAL HEMORRHOIDS 11/12/2003   Janna Arch, PT, DPT   07/12/2019, 12:12 PM  [place] MAIN [facility] SERVICES [address] [place], [place], [address] Phone: [phone]   Fax:  [phone]  Name: KYMONI LESPERANCE MRN: [id number] Date of Birth: February 25, 1966

## 2019-07-15 ENCOUNTER — Encounter: Payer: Self-pay | Admitting: Occupational Therapy

## 2019-07-15 NOTE — Therapy (Signed)
[place] MAIN [facility] SERVICES [address] [place], [place], [address] Phone: [phone]   Fax:  [phone]  Occupational Therapy Treatment/Progress note Reporting period from 06/11/2019 to 07/12/2019  Patient Details  Name: Carol Johnson MRN: [id number] Date of Birth: [birth year] Referring Provider (OT): Dr Loura Pardon at [facility]   Encounter Date: 07/12/2019  OT End of Session - 07/15/19 1644    Visit Number  10    Number of Visits  24    Date for OT Re-Evaluation  08/16/19    OT Start Time  1145    OT Stop Time  1230    OT Time Calculation (min)  45 min    Activity Tolerance  Patient tolerated treatment well    Behavior During Therapy  [facility] for tasks assessed/performed       Past Medical History:  Diagnosis Date  . Abdominal pain, unspecified site   . Allergic rhinitis   . Anxiety   . Asthma   . Depression   . DVT (deep venous thrombosis) ([place]) 2016  . Fibroid uterus   . GERD (gastroesophageal reflux disease)   . Hypercholesterolemia   . Hypertension   . Internal hemorrhoid   . Irritable bowel syndrome   . Lymphocytic colitis 01/02/2015  . Pneumonia 1998  . Stroke ([place])   . Ulcerative colitis ([place])   . URI (upper respiratory infection)   . Uterine fibroid   . Vitamin B12 deficiency     Past Surgical History:  Procedure Laterality Date  . CESAREAN SECTION  2004  . CHOLECYSTECTOMY     laparoscopic "gallstones"  . ESOPHAGOGASTRODUODENOSCOPY (EGD) WITH PROPOFOL N/A 05/03/2014   Procedure: ESOPHAGOGASTRODUODENOSCOPY (EGD) WITH PROPOFOL;  Surgeon: Inda Castle, MD;  [facility];  Service: Endoscopy;  Laterality: N/A;  . MYOMECTOMY    . UTERINE FIBROID SURGERY      There were no vitals filed for this visit.  Subjective Assessment - 07/15/19 1642    Subjective   Patient reports things are going well, she feels she could return to work and do most of her work tasks.  She is still a bit slower with her typing  speed but feels she can still perform and accommodate as needed.    Pertinent History  Pt recently discharged from CIR at [facility] after having a CVA in R corona radiata and basal ganglia and was in CIR from 05/25/19-06/06/19.    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    Currently in Pain?  No/denies    Pain Score  0-No pain    Multiple Pain Sites  No        [facility] OT Assessment - 07/15/19 1704      Coordination   Right 9 Hole Peg Test  19    Left 9 Hole Peg Test  33      Hand Function   Right Hand Grip (lbs)  42    Right Hand Lateral Pinch  14 lbs    Right Hand 3 Point Pinch  15 lbs    Left Hand Grip (lbs)  40    Left Hand Lateral Pinch  13 lbs    Left 3 point pinch  15 lbs        Grip, pinch, coordination progress noted in above flowsheet  Goals updated to reflect progress  ADL:   Patient is able to complete basic self care tasks with modified independence.  She has returned to light homemaking tasks and some cooking.   Patient seen for work related tasks, typing with multiple trials with words per minute 24 to 36 WPM.  Patient took time during tests to correct errors.  Typing drills to target left hand coordination skills.   Patient feels she is able to complete all work tasks she would be required to perform with return to work.  She would currently be working remotely from home.  Speed of typing and coordination remain slower than baseline however she feels she will be able to compensate as needed and still be able to complete tasks without difficulty.   She would like to know more about return to driving, recommend she follow up with physician regarding this area.     Response to tx: Patient has continued to make excellent progress.  She is now able to complete all basic self care tasks with modified independence.  She was staying at her sisters for a  while and now has been able to return home to her brother's house where she lived prior to CVA.  She is able to navigate and manage stairs to be able to access her bedroom, bathroom and office.  She has made good progress with grip, pinch and coordination skills.  Typing has improved but patient remains a little slower than her baseline.  Fine motor coordination and dexterity skills remain slightly impaired but continue to improve.  Continue OT to focus on work related tasks and coordination for greater independence to return to prior level of function.            OT Education - 07/15/19 1643    Education Details  HEP, work tasks    [employer]) Educated  Patient    Methods  Explanation;Demonstration    Comprehension  Verbalized understanding;Returned demonstration;Need further instruction       OT Short Term Goals - 07/15/19 1645      OT SHORT TERM GOAL #1   Title  Pt will be able to use L hand with R for zippers and buttons for upper body dressing independently    Time  12    Period  Weeks    Status  Achieved    Target Date  08/16/19      OT SHORT TERM GOAL #2   Title  Pt will increase strength in L hand to 34# for use during ADLs.    Time  12    Period  Weeks    Status  Achieved    Target Date  08/16/19      OT SHORT TERM GOAL #3   Title  Pt will increase fine motor skills in L hand to complete 9 hole peg test in 30 seconds.    Baseline  45 seconds is needed for 9 hold peg test.    Time  12    Period  Weeks    Status  On-going    Target Date  08/16/19      OT SHORT TERM GOAL #4   Title  Pt will complete typing test using L hand with R on laptop with no more than 5 errors.    Baseline  pt is unable to use L hand to type    Time  12    Period  Weeks    Status  On-going    Target Date  08/16/19  OT SHORT TERM GOAL #5   Title  Pt will incorporate energy conservation tech during ADLs and IADLs.    Baseline  Pt is currently not using.    Time  12    Period  Weeks     Status  Achieved    Target Date  08/16/19      OT SHORT TERM GOAL #6   Title  Pt will be educated in HEP for LUE strength and coordination skills training.    Baseline  pt does not have one currently but was given theraputty exercises with written exercises today.    Time  12    Period  Weeks    Status  On-going    Target Date  08/16/19               Plan - 07/15/19 1644    Clinical Impression Statement  Patient has continued to make excellent progress.  She is now able to complete all basic self care tasks with modified independence.  She was staying at her sisters for a while and now has been able to return home to her brother's house where she lived prior to CVA.  She is able to navigate and manage stairs to be able to access her bedroom, bathroom and office.  She has made good progress with grip, pinch and coordination skills.  Typing has improved but patient remains a little slower than her baseline.  Fine motor coordination and dexterity skills remain slightly impaired but continue to improve.  Continue OT to focus on work related tasks and coordination for greater independence to return to prior level of function.    OT Occupational Profile and History  Problem Focused Assessment - Including review of records relating to presenting problem    Occupational performance deficits (Please refer to evaluation for details):  ADL's;IADL's;Work;Leisure    Body Structure / Function / Physical Skills  ADL;Dexterity;Balance;Coordination;FMC;Tone;UE functional use;Endurance;IADL    Psychosocial Skills  Optometrist and Behaviors    Rehab Potential  Excellent    Clinical Decision Making  Several treatment options, min-mod task modification necessary    Comorbidities Affecting Occupational Performance:  May have comorbidities impacting occupational performance    Modification or Assistance to Complete Evaluation   Min-Moderate modification of tasks or assist with assess  necessary to complete eval    OT Frequency  2x / week    OT Duration  12 weeks    OT Treatment/Interventions  Self-care/ADL training;Therapeutic exercise;Balance training;Psychosocial skills training;Coping strategies training;Therapeutic activities;Neuromuscular education;DME and/or AE instruction;Patient/family education    Consulted and Agree with Plan of Care  Patient       Patient will benefit from skilled therapeutic intervention in order to improve the following deficits and impairments:   Body Structure / Function / Physical Skills: ADL, Dexterity, Balance, Coordination, FMC, Tone, UE functional use, Endurance, IADL   Psychosocial Skills: Coping Strategies, Routines and Behaviors   Visit Diagnosis: Muscle weakness (generalized)  Other lack of coordination  Hemiplegia and hemiparesis following cerebral infarction affecting left non-dominant side [facility])    Problem List Patient Active Problem List   Diagnosis Date Noted  . Muscle weakness (generalized) 06/11/2019  . Hemiplegia and hemiparesis following cerebral infarction affecting left non-dominant side ([place]) 06/11/2019  . Major depressive disorder   . Hypokalemia   . Acute blood loss anemia   . Ulcerative colitis with complication ([place])   . H/O: CVA (cerebrovascular accident) 05/23/2019  . Thrombocytosis ([place]) 05/23/2019  . Coronary artery disease involving  native coronary artery of native heart without angina pectoris 03/21/2018  . Coronary artery calcification 02/10/2018  . Obesity (BMI 30-39.9) 02/10/2018  . Former smoker 01/15/2018  . Dyspnea on exertion 12/21/2017  . Carpal tunnel syndrome 12/21/2017  . Stress incontinence 12/21/2017  . Paresthesia of both feet 07/04/2017  . Always thirsty 07/04/2017  . Abdominal bloating 12/01/2015  . Fatigue 12/01/2015  . Vitamin B12 deficiency 12/01/2015  . Lymphocytic colitis 01/16/2015  . Anemia, iron deficiency 01/16/2015  . Anxiety state 12/20/2014  . Diarrhea  11/27/2014  . Insomnia 11/27/2014  . History of DVT (deep vein thrombosis) 09/17/2014  . OSA (obstructive sleep apnea) 09/02/2014  . Gastritis, chronic 06/06/2014  . Unspecified gastritis and gastroduodenitis without mention of hemorrhage 05/03/2014  . Abdominal pain, epigastric 04/19/2014  . Internal hemorrhoids with other complication 03/47/4259  . Routine general medical examination at a health care facility 10/14/2011  . Thyroid nodule 05/16/2011  . Rectal bleeding 11/24/2010  . Hemorrhoids, internal 11/24/2010  . Irritable bowel syndrome (IBS) 11/24/2010  . ANEMIA 10/02/2009  . Hyperlipidemia 07/18/2009  . Essential hypertension 07/18/2009  . DYSPNEA ON EXERTION 07/18/2009  . THYROID CYST 01/31/2008  . ANXIETY 01/31/2008  . Depression with anxiety 01/31/2008  . GASTRIC ULCER, HX OF 01/31/2008  . FIBROIDS, UTERUS 11/13/2007  . ALLERGIC RHINITIS 11/13/2007  . ASTHMA 11/13/2007  . GERD 11/13/2007  . IRRITABLE BOWEL SYNDROME 11/13/2007  . INTERNAL HEMORRHOIDS 11/12/2003   Amy T Tomasita Morrow, OTR/L, CLT   Lovett,Amy 07/15/2019, 5:14 PM  [place] MAIN [facility] SERVICES [address] [place], [place], [address] Phone: [phone]   Fax:  [phone]  Name: Carol Johnson MRN: [id number] Date of Birth: 16-Jul-1966

## 2019-07-16 ENCOUNTER — Telehealth: Payer: Self-pay

## 2019-07-16 NOTE — Telephone Encounter (Signed)
Patient called stating her paper work states for her to return to work on 07/20/2019 but doesn't have an appt here until 07/24/2019 so patient needs to know if she can return to work and drive.

## 2019-07-17 ENCOUNTER — Encounter: Payer: Self-pay | Admitting: Physical Therapy

## 2019-07-17 ENCOUNTER — Encounter: Payer: Self-pay | Admitting: Occupational Therapy

## 2019-07-17 ENCOUNTER — Ambulatory Visit: Payer: BC Managed Care – PPO | Admitting: Occupational Therapy

## 2019-07-17 ENCOUNTER — Ambulatory Visit: Payer: BC Managed Care – PPO | Admitting: Physical Therapy

## 2019-07-17 ENCOUNTER — Other Ambulatory Visit: Payer: Self-pay

## 2019-07-17 DIAGNOSIS — M6281 Muscle weakness (generalized): Secondary | ICD-10-CM

## 2019-07-17 DIAGNOSIS — I69354 Hemiplegia and hemiparesis following cerebral infarction affecting left non-dominant side: Secondary | ICD-10-CM

## 2019-07-17 DIAGNOSIS — R278 Other lack of coordination: Secondary | ICD-10-CM

## 2019-07-17 DIAGNOSIS — R058 Other specified cough: Secondary | ICD-10-CM

## 2019-07-17 DIAGNOSIS — R05 Cough: Secondary | ICD-10-CM | POA: Diagnosis not present

## 2019-07-17 DIAGNOSIS — I639 Cerebral infarction, unspecified: Secondary | ICD-10-CM | POA: Diagnosis not present

## 2019-07-17 NOTE — Telephone Encounter (Signed)
Discussed with CMA.  I did not write or discuss for her to return to work or driving.  She she follow up with whomever released her.  Thanks.

## 2019-07-17 NOTE — Therapy (Signed)
[place] MAIN [facility] SERVICES [address] [place], [place], [address] Phone: [phone]   Fax:  [phone]  Occupational Therapy Treatment  Patient Details  Name: Carol Johnson MRN: [id number] Date of Birth: 10-Sep-1965 Referring Provider (OT): Dr Loura Pardon at [facility]   Encounter Date: 07/17/2019  OT End of Session - 07/17/19 1123    Visit Number  11    Number of Visits  24    Date for OT Re-Evaluation  08/16/19    OT Start Time  1110    OT Stop Time  1145    OT Time Calculation (min)  35 min    Activity Tolerance  Patient tolerated treatment well    Behavior During Therapy  [facility] for tasks assessed/performed       Past Medical History:  Diagnosis Date  . Abdominal pain, unspecified site   . Allergic rhinitis   . Anxiety   . Asthma   . Depression   . DVT (deep venous thrombosis) ([place]) 2016  . Fibroid uterus   . GERD (gastroesophageal reflux disease)   . Hypercholesterolemia   . Hypertension   . Internal hemorrhoid   . Irritable bowel syndrome   . Lymphocytic colitis 01/02/2015  . Pneumonia 1998  . Stroke ([place])   . Ulcerative colitis ([place])   . URI (upper respiratory infection)   . Uterine fibroid   . Vitamin B12 deficiency     Past Surgical History:  Procedure Laterality Date  . CESAREAN SECTION  2004  . CHOLECYSTECTOMY     laparoscopic "gallstones"  . ESOPHAGOGASTRODUODENOSCOPY (EGD) WITH PROPOFOL N/A 05/03/2014   Procedure: ESOPHAGOGASTRODUODENOSCOPY (EGD) WITH PROPOFOL;  Surgeon: Inda Castle, MD;  Location: [facility];  Service: Endoscopy;  Laterality: N/A;  . MYOMECTOMY    . UTERINE FIBROID SURGERY      There were no vitals filed for this visit.  Subjective Assessment - 07/17/19 1119    Subjective   Pt. reports being rattled today, about a family issue.    Patient is accompanied by:  Family member    Pertinent History  Pt recently discharged from [facility] after having a CVA in R corona  radiata and basal ganglia and was in CIR from 05/25/19-06/06/19.    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    Currently in Pain?  No/denies     OT TREATMENT    Neuro muscular re-education:  Pt. worked on [facility] skills grasping 1" sticks, 1/4" collars, and 1/4" washers. Pt. worked on storing the objects in the palm, and translatory skills moving the items from the palm of the hand to the tip of the 2nd digit, and thumb. Pt. worked on removing the pegs using bilateral alternating hand patterns. Pt. Presents with limited bilateral alternating hand movements.  Therapeutic Exercise:  Pt. performed gross gripping with grip strengthener. Pt. worked on sustaining grip while grasping pegs and reaching at various heights. The Gripper was set at 17.9# for grip strength resistive force.   Response to Treatment:  Pt. is making progress and is engaging her left hand more at home. Pt. required increased time to complete [facility] skills, translatory movements of the hand,  and hand function skills. Pt. has difficulty with dual tasking talking while performing [facility] skills, and drops several items. Pt. continues to work on improving these skills in order increase left UE functioning, and improve overall ADL, and IADL  functioning.                          OT Education - 07/17/19 1123    Education Details  HEP    Person(s) Educated  Patient    Methods  Explanation;Demonstration    Comprehension  Verbalized understanding;Returned demonstration;Need further instruction       OT Short Term Goals - 07/15/19 1645      OT SHORT TERM GOAL #1   Title  Pt will be able to use L hand with R for zippers and buttons for upper body dressing independently    Time  12    Period  Weeks    Status  Achieved    Target Date  08/16/19      OT SHORT TERM GOAL #2   Title  Pt will increase strength in L hand to 34# for use during ADLs.    Time  12    Period   Weeks    Status  Achieved    Target Date  08/16/19      OT SHORT TERM GOAL #3   Title  Pt will increase fine motor skills in L hand to complete 9 hole peg test in 30 seconds.    Baseline  45 seconds is needed for 9 hold peg test.    Time  12    Period  Weeks    Status  On-going    Target Date  08/16/19      OT SHORT TERM GOAL #4   Title  Pt will complete typing test using L hand with R on laptop with no more than 5 errors.    Baseline  pt is unable to use L hand to type    Time  12    Period  Weeks    Status  On-going    Target Date  08/16/19      OT SHORT TERM GOAL #5   Title  Pt will incorporate energy conservation tech during ADLs and IADLs.    Baseline  Pt is currently not using.    Time  12    Period  Weeks    Status  Achieved    Target Date  08/16/19      OT SHORT TERM GOAL #6   Title  Pt will be educated in HEP for LUE strength and coordination skills training.    Baseline  pt does not have one currently but was given theraputty exercises with written exercises today.    Time  12    Period  Weeks    Status  On-going    Target Date  08/16/19               Plan - 07/17/19 1124    Clinical Impression Statement Pt. is making progress and is engaging her left hand more at home. Pt. required increased time to complete [facility] skills, translatory movements of the hand,  and hand function skills. Pt. has difficulty with dual tasking talking while performing [facility] skills, and drops several items. Pt. continues to work on improving these skills in order increase left UE functioning, and improve overall ADL, and IADL functioning.    OT Occupational Profile and History  Problem Focused Assessment - Including review of records relating to presenting problem    Occupational performance deficits (Please refer to evaluation for details):  ADL's;IADL's;Work;Leisure    Body Structure / Function / Physical Skills  ADL;Dexterity;Balance;Coordination;FMC;Tone;UE functional  use;Endurance;IADL  Psychosocial Skills  Coping Strategies;Routines and Behaviors    Rehab Potential  Excellent    Clinical Decision Making  Several treatment options, min-mod task modification necessary    Comorbidities Affecting Occupational Performance:  May have comorbidities impacting occupational performance    Modification or Assistance to Complete Evaluation   Min-Moderate modification of tasks or assist with assess necessary to complete eval    OT Frequency  2x / week    OT Duration  12 weeks    OT Treatment/Interventions  Self-care/ADL training;Therapeutic exercise;Balance training;Psychosocial skills training;Coping strategies training;Therapeutic activities;Neuromuscular education;DME and/or AE instruction;Patient/family education    Consulted and Agree with Plan of Care  Patient       Patient will benefit from skilled therapeutic intervention in order to improve the following deficits and impairments:   Body Structure / Function / Physical Skills: ADL, Dexterity, Balance, Coordination, FMC, Tone, UE functional use, Endurance, IADL   Psychosocial Skills: Coping Strategies, Routines and Behaviors   Visit Diagnosis: Muscle weakness (generalized)  Other lack of coordination    Problem List Patient Active Problem List   Diagnosis Date Noted  . Muscle weakness (generalized) 06/11/2019  . Hemiplegia and hemiparesis following cerebral infarction affecting left non-dominant side ([place]) 06/11/2019  . Major depressive disorder   . Hypokalemia   . Acute blood loss anemia   . Ulcerative colitis with complication ([place])   . H/O: CVA (cerebrovascular accident) 05/23/2019  . Thrombocytosis ([place]) 05/23/2019  . Coronary artery disease involving native coronary artery of native heart without angina pectoris 03/21/2018  . Coronary artery calcification 02/10/2018  . Obesity (BMI 30-39.9) 02/10/2018  . Former smoker 01/15/2018  . Dyspnea on exertion 12/21/2017  . Carpal tunnel  syndrome 12/21/2017  . Stress incontinence 12/21/2017  . Paresthesia of both feet 07/04/2017  . Always thirsty 07/04/2017  . Abdominal bloating 12/01/2015  . Fatigue 12/01/2015  . Vitamin B12 deficiency 12/01/2015  . Lymphocytic colitis 01/16/2015  . Anemia, iron deficiency 01/16/2015  . Anxiety state 12/20/2014  . Diarrhea 11/27/2014  . Insomnia 11/27/2014  . History of DVT (deep vein thrombosis) 09/17/2014  . OSA (obstructive sleep apnea) 09/02/2014  . Gastritis, chronic 06/06/2014  . Unspecified gastritis and gastroduodenitis without mention of hemorrhage 05/03/2014  . Abdominal pain, epigastric 04/19/2014  . Internal hemorrhoids with other complication 16/08/930  . Routine general medical examination at a health care facility 10/14/2011  . Thyroid nodule 05/16/2011  . Rectal bleeding 11/24/2010  . Hemorrhoids, internal 11/24/2010  . Irritable bowel syndrome (IBS) 11/24/2010  . ANEMIA 10/02/2009  . Hyperlipidemia 07/18/2009  . Essential hypertension 07/18/2009  . DYSPNEA ON EXERTION 07/18/2009  . THYROID CYST 01/31/2008  . ANXIETY 01/31/2008  . Depression with anxiety 01/31/2008  . GASTRIC ULCER, HX OF 01/31/2008  . FIBROIDS, UTERUS 11/13/2007  . ALLERGIC RHINITIS 11/13/2007  . ASTHMA 11/13/2007  . GERD 11/13/2007  . IRRITABLE BOWEL SYNDROME 11/13/2007  . INTERNAL HEMORRHOIDS 11/12/2003    Harrel Carina, MS, OTR/L 07/17/2019, 11:33 AM  [place] MAIN [facility] SERVICES [address] [place], [place], [address] Phone: [phone]   Fax:  [phone]  Name: JAKAIYA NETHERLAND MRN: [id number] Date of Birth: Sep 02, 1965

## 2019-07-17 NOTE — Therapy (Signed)
[place] MAIN [facility] SERVICES [address] [place], [place], [address] Phone: [phone]   Fax:  [phone]  Physical Therapy Treatment  Patient Details  Name: Carol Johnson MRN: [id number] Date of Birth: 1966-06-11 Referring Provider (PT): Lauraine Rinne   Encounter Date: 07/17/2019  PT End of Session - 07/17/19 1131    Visit Number  6    Number of Visits  25    Date for PT Re-Evaluation  09/19/19    PT Start Time  1155    PT Stop Time  1150    PT Time Calculation (min)  5 min    Equipment Utilized During Treatment  Gait belt    Activity Tolerance  Patient tolerated treatment well    Behavior During Therapy  [facility] for tasks assessed/performed       Past Medical History:  Diagnosis Date  . Abdominal pain, unspecified site   . Allergic rhinitis   . Anxiety   . Asthma   . Depression   . DVT (deep venous thrombosis) ([place]) 2016  . Fibroid uterus   . GERD (gastroesophageal reflux disease)   . Hypercholesterolemia   . Hypertension   . Internal hemorrhoid   . Irritable bowel syndrome   . Lymphocytic colitis 01/02/2015  . Pneumonia 1998  . Stroke ([place])   . Ulcerative colitis ([place])   . URI (upper respiratory infection)   . Uterine fibroid   . Vitamin B12 deficiency     Past Surgical History:  Procedure Laterality Date  . CESAREAN SECTION  2004  . CHOLECYSTECTOMY     laparoscopic "gallstones"  . ESOPHAGOGASTRODUODENOSCOPY (EGD) WITH PROPOFOL N/A 05/03/2014   Procedure: ESOPHAGOGASTRODUODENOSCOPY (EGD) WITH PROPOFOL;  Surgeon: Inda Castle, MD;  [facility];  Service: Endoscopy;  Laterality: N/A;  . MYOMECTOMY    . UTERINE FIBROID SURGERY      There were no vitals filed for this visit.    TREATMENT:  Therapeutic exercise:   Ambulation in hall 55f x2 no AD, cues for heel strike, decreased LUE arm swing, trunk rotation noted  Ambulation in hall with horizontal head turns, vertical head turns [id number] ea  direction of head turns   CGA provided for safety due to mild unsteadiness noted, cues for heel strike  Leg press 90 lbs x 20 x 2   Standing hip extension and hip abduction with BUE support 2x10 bilaterally, cues for upright posture/technique   Unilateral heel raises with BUE; BUE support increased to maximize LLE ROM, decreased from RLE to maximize balance challenge  NMR: Standing on 1/2 foam, horizontal head turns, vertical head turns 2x30sec ea , CGa, pt challenged by uneven surface, reported feeling nauseous/ has motion sickness.  Standing on 1/2 foam,, equal weight bearing,  cues. No UE support  Standing on blue foam and tapping steps x 10  Patient performed with instruction, verbal cues, tactile cues of therapist: goal: increase tissue extensibility, promote proper posture, improve mobility   Pt response/clinical impression: . CGA-minA provided for balance activities this session.  The patient would benefit from further skilled PT to continue to progress program as able to maximize safety, mobility, and to decrease risk of falls                         PT Education - 07/17/19 1131    Education Details  HEP    Person(s) Educated  Patient    Methods  Explanation  Comprehension  Verbal cues required;Tactile cues required;Need further instruction       PT Short Term Goals - 06/27/19 1411      PT SHORT TERM GOAL #1   Title  Patient will be independent in home exercise program to improve strength/mobility for better functional independence with ADLs.    Time  6    Period  Weeks    Status  New    Target Date  08/08/19      PT SHORT TERM GOAL #2   Title  Patient (< 73 years old) will complete five times sit to stand test in < 10 seconds indicating an increased LE strength and improved balance.    Time  6    Period  Weeks    Status  New    Target Date  08/08/19        PT Long Term Goals - 06/27/19 1412      PT LONG TERM GOAL #1   Title   Patient will increase Berg Balance score by > 6 points to demonstrate decreased fall risk during functional activities.    Time  12    Period  Weeks    Status  New    Target Date  09/19/19      PT LONG TERM GOAL #2   Title  Patient will increase six minute walk test distance to >1200 for progression to community ambulator and improve gait ability    Time  12    Period  Weeks    Status  New    Target Date  09/19/19      PT LONG TERM GOAL #3   Title  Patient will increase 10 meter walk test to >1.23ms as to improve gait speed for better community ambulation and to reduce fall risk    Time  12    Period  Weeks    Status  New    Target Date  09/19/19      PT LONG TERM GOAL #4   Title  Patient will reduce timed up and go to <11 seconds to reduce fall risk and demonstrate improved transfer/gait ability.    Time  12    Period  Weeks    Status  New    Target Date  09/19/19            Plan - 07/17/19 1132    Clinical Impression Statement  Patient instructed in intermediate strengthening and balance exercise.  Patient requires min Vcs for correct exercise technique including to improve LE control with standing exercise. Patient demonstrates better quad control with SLS tasks with rail assist. Patient would benefit from additional skilled PT intervention to improve balance/gait safety and reduce fall risk.   Examination-Activity Limitations  Carry    Examination-Participation Restrictions  Driving    Rehab Potential  Good    PT Frequency  2x / week    PT Duration  12 weeks    PT Treatment/Interventions  Gait training;Stair training;Neuromuscular re-education;Balance training;Therapeutic exercise;Manual techniques;Dry needling;Passive range of motion    PT Next Visit Plan  continue to progress program    PT Home Exercise Plan  HEP updated this session, HEP code: [id number]    Consulted and Agree with Plan of Care  Patient       Patient will benefit from skilled therapeutic  intervention in order to improve the following deficits and impairments:  Abnormal gait, Decreased strength, Decreased activity tolerance  Visit Diagnosis: No diagnosis found.  Problem List Patient Active Problem List   Diagnosis Date Noted  . Muscle weakness (generalized) 06/11/2019  . Hemiplegia and hemiparesis following cerebral infarction affecting left non-dominant side ([place]) 06/11/2019  . Major depressive disorder   . Hypokalemia   . Acute blood loss anemia   . Ulcerative colitis with complication ([place])   . H/O: CVA (cerebrovascular accident) 05/23/2019  . Thrombocytosis ([place]) 05/23/2019  . Coronary artery disease involving native coronary artery of native heart without angina pectoris 03/21/2018  . Coronary artery calcification 02/10/2018  . Obesity (BMI 30-39.9) 02/10/2018  . Former smoker 01/15/2018  . Dyspnea on exertion 12/21/2017  . Carpal tunnel syndrome 12/21/2017  . Stress incontinence 12/21/2017  . Paresthesia of both feet 07/04/2017  . Always thirsty 07/04/2017  . Abdominal bloating 12/01/2015  . Fatigue 12/01/2015  . Vitamin B12 deficiency 12/01/2015  . Lymphocytic colitis 01/16/2015  . Anemia, iron deficiency 01/16/2015  . Anxiety state 12/20/2014  . Diarrhea 11/27/2014  . Insomnia 11/27/2014  . History of DVT (deep vein thrombosis) 09/17/2014  . OSA (obstructive sleep apnea) 09/02/2014  . Gastritis, chronic 06/06/2014  . Unspecified gastritis and gastroduodenitis without mention of hemorrhage 05/03/2014  . Abdominal pain, epigastric 04/19/2014  . Internal hemorrhoids with other complication 59/74/1638  . Routine general medical examination at a health care facility 10/14/2011  . Thyroid nodule 05/16/2011  . Rectal bleeding 11/24/2010  . Hemorrhoids, internal 11/24/2010  . Irritable bowel syndrome (IBS) 11/24/2010  . ANEMIA 10/02/2009  . Hyperlipidemia 07/18/2009  . Essential hypertension 07/18/2009  . DYSPNEA ON EXERTION 07/18/2009  . THYROID  CYST 01/31/2008  . ANXIETY 01/31/2008  . Depression with anxiety 01/31/2008  . GASTRIC ULCER, HX OF 01/31/2008  . FIBROIDS, UTERUS 11/13/2007  . ALLERGIC RHINITIS 11/13/2007  . ASTHMA 11/13/2007  . GERD 11/13/2007  . IRRITABLE BOWEL SYNDROME 11/13/2007  . INTERNAL HEMORRHOIDS 11/12/2003    Alanson Puls, PT DPT 07/17/2019, 11:33 AM  [place] MAIN [facility] SERVICES [address] [place], [place], [address] Phone: [phone]   Fax:  [phone]  Name: Carol Johnson MRN: [id number] Date of Birth: 04/23/1966

## 2019-07-18 NOTE — Telephone Encounter (Signed)
Spoke with patient.  Her employer had her set to return to work on the 20th. She has since had that extended to reflect her follow up with Dr. Posey Pronto.  She desires to discuss restoration of driving privileges.  She states her left leg and arm are getting stronger. She is right side dominant.  She was curious what steps need to be taking to get that process going

## 2019-07-18 NOTE — Telephone Encounter (Signed)
Thank you. Will discuss in length tomorrow.

## 2019-07-19 ENCOUNTER — Ambulatory Visit: Payer: BC Managed Care – PPO | Admitting: Occupational Therapy

## 2019-07-19 ENCOUNTER — Ambulatory Visit: Payer: BC Managed Care – PPO | Admitting: Physical Therapy

## 2019-07-24 ENCOUNTER — Encounter: Payer: Self-pay | Admitting: Physical Medicine & Rehabilitation

## 2019-07-24 ENCOUNTER — Other Ambulatory Visit: Payer: Self-pay

## 2019-07-24 ENCOUNTER — Encounter: Payer: BC Managed Care – PPO | Admitting: Occupational Therapy

## 2019-07-24 ENCOUNTER — Encounter: Payer: BC Managed Care – PPO | Attending: Registered Nurse | Admitting: Physical Medicine & Rehabilitation

## 2019-07-24 VITALS — BP 125/83 | HR 79 | Temp 97.9°F | Ht 63.0 in | Wt 213.0 lb

## 2019-07-24 DIAGNOSIS — M62838 Other muscle spasm: Secondary | ICD-10-CM | POA: Diagnosis not present

## 2019-07-24 DIAGNOSIS — F5101 Primary insomnia: Secondary | ICD-10-CM | POA: Insufficient documentation

## 2019-07-24 DIAGNOSIS — I639 Cerebral infarction, unspecified: Secondary | ICD-10-CM | POA: Insufficient documentation

## 2019-07-24 DIAGNOSIS — I69359 Hemiplegia and hemiparesis following cerebral infarction affecting unspecified side: Secondary | ICD-10-CM | POA: Diagnosis not present

## 2019-07-24 DIAGNOSIS — R269 Unspecified abnormalities of gait and mobility: Secondary | ICD-10-CM

## 2019-07-24 DIAGNOSIS — E7849 Other hyperlipidemia: Secondary | ICD-10-CM | POA: Insufficient documentation

## 2019-07-24 NOTE — Progress Notes (Signed)
Subjective:    Patient ID: Carol Johnson, female    DOB: March 20, 1966, 53 y.o.   MRN: [id number]  HPI: Carol Johnson is a 53 y.o. female right-handed  with history of hypertension, ulcerative colitis, DVT 2016 left posterior tibial vein contributed to estrogen use and smoking, hyperlipidemia, asthma, presents for follow up for  right posterior corona radiata/PL IC infarction likely secondary small vessel disease.    Last clinic visit on 06/19/2019.  Seen by NP, notes reviewed.  Since that time, communication exchanged regarding return to work and driving. She has not followed up with Cards regarding Loop. She notes her mood has been good. She is in outpatient therapies. She sees Neurology later this month.  Sleep has improved.  She saw GYN regarding left breast drainage and states there were no issues. She still has muscle spasms in her left arm, which are improving. She is on ASA alone.  She would like to return to work.   Pain Inventory Average Pain 0 Pain Right Now 0 My pain is no pain  In the last 24 hours, has pain interfered with the following? General activity 0 Relation with others 0 Enjoyment of life 0 What TIME of day is your pain at its worst? no pain Sleep (in general) Fair  Pain is worse with: no pain Pain improves with: no pain Relief from Meds: no pain  Mobility walk with assistance use a walker ability to climb steps?  no do you drive?  no  Function disabled: date disabled .  Neuro/Psych trouble walking spasms  Prior Studies transitional care  Physicians involved in your care transitional care   Family History  Problem Relation Age of Onset  . Pulmonary fibrosis Father   . Colon cancer Paternal Aunt   . Uterine cancer Paternal Grandmother        with mets to the colon   . Heart failure Maternal Grandmother   . Heart disease Paternal Uncle   . Heart disease Paternal Aunt   . Kidney disease Cousin   . Hyperlipidemia Mother   . Mitral valve  prolapse Mother   . Heart disease Mother        arrhythmia   Social History   Socioeconomic History  . Marital status: Married    Spouse name: Not on file  . Number of children: 1  . Years of education: Not on file  . Highest education level: Not on file  Occupational History  . Occupation: Admin. assistant  Social Needs  . Financial resource strain: Not on file  . Food insecurity    Worry: Not on file    Inability: Not on file  . Transportation needs    Medical: Not on file    Non-medical: Not on file  Tobacco Use  . Smoking status: Former Smoker    Packs/day: 0.10    Years: 10.00    Pack years: 1.00    Types: Cigarettes    Quit date: 12/07/2017    Years since quitting: 1.6  . Smokeless tobacco: Never Used  Substance and Sexual Activity  . Alcohol use: Yes    Alcohol/week: 5.0 standard drinks    Types: 5 Cans of beer per week  . Drug use: No  . Sexual activity: Yes    Birth control/protection: Pill  Lifestyle  . Physical activity    Days per week: Not on file    Minutes per session: Not on file  . Stress: Not on file  Relationships  .  Social Herbalist on phone: Not on file    Gets together: Not on file    Attends religious service: Not on file    Active member of club or organization: Not on file    Attends meetings of clubs or organizations: Not on file    Relationship status: Not on file  Other Topics Concern  . Not on file  Social History Narrative  . Not on file   Past Surgical History:  Procedure Laterality Date  . CESAREAN SECTION  2004  . CHOLECYSTECTOMY     laparoscopic "gallstones"  . ESOPHAGOGASTRODUODENOSCOPY (EGD) WITH PROPOFOL N/A 05/03/2014   Procedure: ESOPHAGOGASTRODUODENOSCOPY (EGD) WITH PROPOFOL;  Surgeon: Inda Castle, MD;  Location: [facility];  Service: Endoscopy;  Laterality: N/A;  . MYOMECTOMY    . UTERINE FIBROID SURGERY     Past Medical History:  Diagnosis Date  . Abdominal pain, unspecified site   . Allergic  rhinitis   . Anxiety   . Asthma   . Depression   . DVT (deep venous thrombosis) ([place]) 2016  . Fibroid uterus   . GERD (gastroesophageal reflux disease)   . Hypercholesterolemia   . Hypertension   . Internal hemorrhoid   . Irritable bowel syndrome   . Lymphocytic colitis 01/02/2015  . Pneumonia 1998  . Stroke ([place])   . Ulcerative colitis ([place])   . URI (upper respiratory infection)   . Uterine fibroid   . Vitamin B12 deficiency    BP 125/83   Pulse 79   Temp 97.9 F (36.6 C)   Ht 5' 3"  (1.6 m)   Wt 213 lb (96.6 kg)   LMP 11/29/2015   SpO2 97%   BMI 37.73 kg/m   Opioid Risk Score:   Fall Risk Score:  `1  Depression screen PHQ 2/9  Depression screen [facility] 2/9 04/06/2019 07/04/2017 06/27/2013  Decreased Interest 1 1 0  Down, Depressed, Hopeless 1 1 0  PHQ - 2 Score 2 2 0  Altered sleeping 1 1 -  Tired, decreased energy 1 1 -  Change in appetite 2 2 -  Feeling bad or failure about yourself  2 1 -  Trouble concentrating 1 0 -  Moving slowly or fidgety/restless 0 1 -  Suicidal thoughts 0 0 -  PHQ-9 Score 9 8 -  Some recent data might be hidden    Review of Systems  Constitutional: Negative.   HENT: Negative.   Eyes: Negative.   Respiratory: Negative.   Cardiovascular: Negative.   Gastrointestinal: Negative.   Endocrine: Negative.   Genitourinary: Negative.   Musculoskeletal: Positive for gait problem and myalgias.       Spasms   Skin: Negative.   Allergic/Immunologic: Negative.   Hematological: Negative.   Psychiatric/Behavioral: Negative.   All other systems reviewed and are negative.     Objective:   Physical Exam; Constitutional: No distress . Vital signs reviewed. HENT: Normocephalic.  Atraumatic. Eyes: EOMI. No discharge. Cardiovascular: No JVD. Respiratory: Normal effort.  No stridor. GI: Non-distended. Skin: Warm and dry.  Intact. Psych: Normal mood.  Normal behavior. Musc: No edema in extremities.  No tenderness in extremities. Neurological:  Alert Motor: RUE/RLE: 5/5 proximal distal LUE/LLE: 4+/5 proximal to distal    Assessment & Plan:  Female right-handed  with history of hypertension, ulcerative colitis, DVT 2016 left posterior tibial vein contributed to estrogen use and smoking, hyperlipidemia, asthma, presents for follow up for  right posterior corona radiata/PL IC infarction likely secondary small  vessel disease.    1. Left-sided weakness and hemi-sensory loss secondary to right posterior corona radiata/PLIC infarction likely secondary small vessel disease.    Cont therapies  Follow up with Neurology  Follow up with Cards - needs appointment, information provided  Discussed gradual return to driving with licenced driver  She may return to work - she is working at home on the computer/phone, which she states she is able to do  2. Muscle spasms  Cont Robaxin PRN, 566m BID PRN  3. Morbid obesity  Cont weight loss  States she does not feel she needs dietitian referral at present  4. Gait abnormality  Cont therapies  Cont cane for safety  Meds reviewed Referrals reviewed - Needs Cards appointment All questions answered  >25 minutes spent with patient, with >20 minutes in counseling regarding driving, work, and follow up appointments

## 2019-07-25 ENCOUNTER — Ambulatory Visit: Payer: BC Managed Care – PPO

## 2019-07-25 DIAGNOSIS — I69354 Hemiplegia and hemiparesis following cerebral infarction affecting left non-dominant side: Secondary | ICD-10-CM | POA: Diagnosis not present

## 2019-07-25 DIAGNOSIS — R278 Other lack of coordination: Secondary | ICD-10-CM

## 2019-07-25 DIAGNOSIS — R05 Cough: Secondary | ICD-10-CM

## 2019-07-25 DIAGNOSIS — R058 Other specified cough: Secondary | ICD-10-CM

## 2019-07-25 DIAGNOSIS — I639 Cerebral infarction, unspecified: Secondary | ICD-10-CM

## 2019-07-25 DIAGNOSIS — M6281 Muscle weakness (generalized): Secondary | ICD-10-CM

## 2019-07-25 NOTE — Therapy (Signed)
[place] MAIN [facility] SERVICES [address] [place], [place], [address] Phone: [phone]   Fax:  [phone]  Physical Therapy Treatment  Patient Details  Name: Carol Johnson MRN: [id number] Date of Birth: Jan 17, 1966 Referring Provider (PT): Lauraine Rinne   Encounter Date: 07/25/2019  PT End of Session - 07/25/19 1210    Visit Number  7    Number of Visits  25    Date for PT Re-Evaluation  09/19/19    PT Start Time  1151    PT Stop Time  1230    PT Time Calculation (min)  39 min    Equipment Utilized During Treatment  Gait belt    Activity Tolerance  Patient tolerated treatment well    Behavior During Therapy  [facility] for tasks assessed/performed       Past Medical History:  Diagnosis Date  . Abdominal pain, unspecified site   . Allergic rhinitis   . Anxiety   . Asthma   . Depression   . DVT (deep venous thrombosis) ([place]) 2016  . Fibroid uterus   . GERD (gastroesophageal reflux disease)   . Hypercholesterolemia   . Hypertension   . Internal hemorrhoid   . Irritable bowel syndrome   . Lymphocytic colitis 01/02/2015  . Pneumonia 1998  . Stroke ([place])   . Ulcerative colitis ([place])   . URI (upper respiratory infection)   . Uterine fibroid   . Vitamin B12 deficiency     Past Surgical History:  Procedure Laterality Date  . CESAREAN SECTION  2004  . CHOLECYSTECTOMY     laparoscopic "gallstones"  . ESOPHAGOGASTRODUODENOSCOPY (EGD) WITH PROPOFOL N/A 05/03/2014   Procedure: ESOPHAGOGASTRODUODENOSCOPY (EGD) WITH PROPOFOL;  Surgeon: Inda Castle, MD;  Location: [facility];  Service: Endoscopy;  Laterality: N/A;  . MYOMECTOMY    . UTERINE FIBROID SURGERY      There were no vitals filed for this visit.  Subjective Assessment - 07/25/19 1155    Subjective  Pt doing good today. HEP going well. No falls or medical updates. Reports conitnued laterla hip pain.    Pertinent History  Pt recently discharged from CIR at [facility] [address]  after having a CVA in R corona radiata and basal ganglia and was in CIR from 05/25/19-06/06/19.    Currently in Pain?  No/denies       INTERVENTION THIS DATE: TREATMENT:  Therapeutic exercise:  -Bilat Leg press 90 lbs x 1x20, seat hole 7 -unilateral leg press 1x15 RLE 75lbs (seat hole 7); 1x10 @105 ; LLE1x10 on 45 degrees -seated brace marching 1x15 bilat -seated LLE ankle dorsiflexion c 10lb AW 2x15   NMR: Standing on 1/2 foam, horizontal head turns, vertical head turns 2x30sec ea, CGa, pt challenged by uneven surface, reported feeling nauseous/ has motion sickness. -Ambulation in hall 32f x2 no AD, cues for heel strike, decreased LUE arm swing, trunk rotation noted -Ambulation in hall with horizontal head turns, vertical head turns [id number] ea direction of head turns  -AMB in hallway SPC gait training for 2 point gait to address Left Trendelenburg and trochanteric pain; [id number], 140f60fE (pt struggles with LUE sequencing, educated on importance of slowly attempting over time for utility)   PT Short Term Goals - 06/27/19 1411      PT SHORT TERM GOAL #1   Title  Patient will be independent in home exercise program to improve strength/mobility for better functional independence with ADLs.    Time  6  Period  Weeks    Status  New    Target Date  08/08/19      PT SHORT TERM GOAL #2   Title  Patient (< 52 years old) will complete five times sit to stand test in < 10 seconds indicating an increased LE strength and improved balance.    Time  6    Period  Weeks    Status  New    Target Date  08/08/19        PT Long Term Goals - 06/27/19 1412      PT LONG TERM GOAL #1   Title  Patient will increase Berg Balance score by > 6 points to demonstrate decreased fall risk during functional activities.    Time  12    Period  Weeks    Status  New    Target Date  09/19/19      PT LONG TERM GOAL #2   Title  Patient will increase six minute walk test distance to >1200 for progression  to community ambulator and improve gait ability    Time  12    Period  Weeks    Status  New    Target Date  09/19/19      PT LONG TERM GOAL #3   Title  Patient will increase 10 meter walk test to >1.49ms as to improve gait speed for better community ambulation and to reduce fall risk    Time  12    Period  Weeks    Status  New    Target Date  09/19/19      PT LONG TERM GOAL #4   Title  Patient will reduce timed up and go to <11 seconds to reduce fall risk and demonstrate improved transfer/gait ability.    Time  12    Period  Weeks    Status  New    Target Date  09/19/19            Plan - 07/25/19 1210    Clinical Impression Statement  In general, patient demonstrating good tolerance to therapy session this date, reasonable accommodations are alllowed in-session to allow adequate rest between activities as needed. All interventional executed without any exacerbation of pain or other symptoms.  Pt given intermittent multimodal cues to teach best possible form with exercises. Pt continues to make steady progress toward most goals. No home exercise updates made at this time. Extensive cues for [facility] training to address worsening Left trochanteric pain. Pt also moved to single leg leg press performance as strength differential is more easily addressed, >50% impairment on Left.     Stability/Clinical Decision Making  Stable/Uncomplicated    Clinical Decision Making  Low    Rehab Potential  Good    PT Frequency  2x / week    PT Duration  12 weeks    PT Treatment/Interventions  Gait training;Stair training;Neuromuscular re-education;Balance training;Therapeutic exercise;Manual techniques;Dry needling;Passive range of motion    PT Next Visit Plan  continue to progress program    PT Home Exercise Plan  HEP updated this session, HEP code: [id number]    Consulted and Agree with Plan of Care  Patient       Patient will benefit from skilled therapeutic intervention in order to improve  the following deficits and impairments:  Abnormal gait, Decreased strength, Decreased activity tolerance  Visit Diagnosis: Muscle weakness (generalized)  Other lack of coordination  Hemiplegia and hemiparesis following cerebral infarction affecting left non-dominant side (HCC)  Small vessel cerebrovascular accident (CVA) ([place])  Respiratory tract congestion with cough     Problem List Patient Active Problem List   Diagnosis Date Noted  . Hemiparesis affecting nondominant side as late effect of stroke ([place]) 07/24/2019  . Muscle weakness (generalized) 06/11/2019  . Hemiplegia and hemiparesis following cerebral infarction affecting left non-dominant side ([place]) 06/11/2019  . Major depressive disorder   . Hypokalemia   . Acute blood loss anemia   . Ulcerative colitis with complication ([place])   . H/O: CVA (cerebrovascular accident) 05/23/2019  . Thrombocytosis ([place]) 05/23/2019  . Coronary artery disease involving native coronary artery of native heart without angina pectoris 03/21/2018  . Coronary artery calcification 02/10/2018  . Obesity (BMI 30-39.9) 02/10/2018  . Former smoker 01/15/2018  . Dyspnea on exertion 12/21/2017  . Carpal tunnel syndrome 12/21/2017  . Stress incontinence 12/21/2017  . Paresthesia of both feet 07/04/2017  . Always thirsty 07/04/2017  . Abdominal bloating 12/01/2015  . Fatigue 12/01/2015  . Vitamin B12 deficiency 12/01/2015  . Lymphocytic colitis 01/16/2015  . Anemia, iron deficiency 01/16/2015  . Anxiety state 12/20/2014  . Diarrhea 11/27/2014  . Insomnia 11/27/2014  . History of DVT (deep vein thrombosis) 09/17/2014  . OSA (obstructive sleep apnea) 09/02/2014  . Gastritis, chronic 06/06/2014  . Unspecified gastritis and gastroduodenitis without mention of hemorrhage 05/03/2014  . Abdominal pain, epigastric 04/19/2014  . Internal hemorrhoids with other complication 79/72/8206  . Routine general medical examination at a health care facility  10/14/2011  . Thyroid nodule 05/16/2011  . Rectal bleeding 11/24/2010  . Hemorrhoids, internal 11/24/2010  . Irritable bowel syndrome (IBS) 11/24/2010  . ANEMIA 10/02/2009  . Hyperlipidemia 07/18/2009  . Essential hypertension 07/18/2009  . DYSPNEA ON EXERTION 07/18/2009  . THYROID CYST 01/31/2008  . ANXIETY 01/31/2008  . Depression with anxiety 01/31/2008  . GASTRIC ULCER, HX OF 01/31/2008  . FIBROIDS, UTERUS 11/13/2007  . ALLERGIC RHINITIS 11/13/2007  . ASTHMA 11/13/2007  . GERD 11/13/2007  . IRRITABLE BOWEL SYNDROME 11/13/2007  . INTERNAL HEMORRHOIDS 11/12/2003   12:32 PM, 07/25/19 Etta Grandchild, PT, DPT Physical Therapist - [place] [phone]     Etta Grandchild 07/25/2019, 12:14 PM  [place] MAIN [facility] SERVICES [address] [place], [place], [address] Phone: [phone]   Fax:  [phone]  Name: Carol Johnson MRN: [id number] Date of Birth: 02/15/1966

## 2019-07-27 ENCOUNTER — Other Ambulatory Visit: Payer: Self-pay | Admitting: Gastroenterology

## 2019-07-30 ENCOUNTER — Encounter: Payer: Self-pay | Admitting: Adult Health

## 2019-07-30 ENCOUNTER — Ambulatory Visit: Payer: BC Managed Care – PPO | Admitting: Adult Health

## 2019-07-30 ENCOUNTER — Other Ambulatory Visit: Payer: Self-pay

## 2019-07-30 VITALS — BP 126/78 | HR 81 | Temp 97.9°F | Ht 63.0 in | Wt 213.0 lb

## 2019-07-30 DIAGNOSIS — I10 Essential (primary) hypertension: Secondary | ICD-10-CM | POA: Diagnosis not present

## 2019-07-30 DIAGNOSIS — E785 Hyperlipidemia, unspecified: Secondary | ICD-10-CM | POA: Diagnosis not present

## 2019-07-30 DIAGNOSIS — I671 Cerebral aneurysm, nonruptured: Secondary | ICD-10-CM | POA: Diagnosis not present

## 2019-07-30 DIAGNOSIS — Z8673 Personal history of transient ischemic attack (TIA), and cerebral infarction without residual deficits: Secondary | ICD-10-CM | POA: Diagnosis not present

## 2019-07-30 NOTE — Patient Instructions (Addendum)
Continue aspirin 81 mg daily  and lipitor 20m  for secondary stroke prevention  Continue to follow up with PCP regarding cholesterol and blood pressure management   Continue to work with therapies for ongoing improvement   Continue to monitor blood pressure at home  Maintain strict control of hypertension with blood pressure goal below 130/90, diabetes with hemoglobin A1c goal below 6.5% and cholesterol with LDL cholesterol (bad cholesterol) goal below 70 mg/dL. I also advised the patient to eat a healthy diet with plenty of whole grains, cereals, fruits and vegetables, exercise regularly and maintain ideal body weight.  Followup in the future with me in 4 months or call earlier if needed       [facility]. I hope we have been able to provide you high quality care today.  You may receive a patient satisfaction survey over the next few weeks. We would appreciate your feedback and comments so that we may continue to improve ourselves and the health of our patients.     Cerebral Aneurysm  A cerebral aneurysm is a bulge that occurs in the blood vessel inside the brain. An aneurysm is caused when a weakened part of the blood vessel expands. The blood vessel expands due to the constant pressure from the flow of blood through the weakened blood vessel. As the weakened aneurysm expands, the walls of the aneurysm become weaker. Aneurysms are dangerous because they can leak or burst (rupture). When a cerebral aneurysm ruptures, it causes bleeding in the brain (subarachnoid hemorrhage). The blood flow to the area of the brain supplied by the artery is also reduced. This can cause stroke, seizures, or coma. A ruptured cerebral aneurysm is a medical emergency. This can cause permanent brain damage or death. What are the causes? The exact cause of this condition is not known. What increases the risk? This condition is more likely to develop in  people who:  Are older. The condition is most common in people between the ages of 573-60  Are female  Have a family history of aneurysm in two or more direct relatives.  Have certain conditions that are passed along from parent to child (inherited). They include: ? Autosomal dominant polycystic kidney disease. This is a condition in which small, fluid-filled sacs (cysts) develop in the kidney. ? Neurofibromatosis type 1. In this condition, flat spots develop under the skin (pigmentation) and tumors grow along nerves in the skin, brain, and other parts of the body. ? Ehlers-Danlos syndrome. This is a condition in which bad connective tissue causes loose or unstable joints and creates a very soft skin that bruises or tears easily.  Smoke.  Have high blood pressure (hypertension).  Abuse alcohol. What are the signs or symptoms? The signs and symptoms of a cerebral aneurysm that has not leaked or ruptured can depend on its size and rate of growth. A small, unchanging aneurysm generally does not cause symptoms. A larger aneurysm that is steadily growing can increase pressure on the brain or nerves.  The increased pressure from a cerebral aneurysm that has not leaked or ruptured can cause:  A headache.  Vision problems.  Numbness or weakness in an arm or leg.  Memory problems.  Problems speaking.  Seizures. If an aneurysm leaks or ruptures, it can cause a life-threatening condition, such as stroke. Symptoms may include:  A sudden, severe headache with no known cause. The headache is often described as the worst headache ever  experienced.  Stiff neck.  Nausea or vomiting, especially when combined with other symptoms, such as a headache.  Sudden weakness or numbness of the face, arm, or leg, especially on one side of the body.  Sudden trouble walking or difficulty moving the arms or legs.  Double vision.  Sudden trouble seeing in one or both eyes.  Trouble speaking or  understanding speech (aphasia).  Trouble swallowing.  Dizziness.  Loss of balance or coordination.  Intolerance to light.  Sudden confusion or loss of consciousness. How is this diagnosed? This condition is diagnosed using certain tests, including:  CT scan.  Computed tomographic angiogram (CTA). This test uses a dye and a scanner to produce images of your blood vessels.  Magnetic resonance angiogram (MRA). This test uses an MRI machine to produce images of your blood vessels.  Digital subtraction angiogram (DSA). This test involves placing a flexible, thin tube (catheter) into the artery in your thigh and guiding it up to the arteries in the brain. A dye is then injected into the area and X-rays are taken to create images of your blood vessels. How is this treated? Unruptured aneurysm Treatment is complex when an aneurysm is found and it is not causing problems. Treatment is individualized, as each case is different. Many factors must be considered, such as the size and exact location of your aneurysm, your age, your overall health, and your preferences. Small aneurysms in certain locations of the brain have a very low chance of bleeding or rupturing. These small aneurysms may not be treated.  In some cases, however, treatment may be required. Treatment depends on the size and location of the aneurysm. They may include:  Coiling. During this procedure, a catheter is inserted and advanced through a blood vessel. Once the catheter reaches the aneurysm, tiny coils are used to block blood flow into the aneurysm. This procedure is sometimes done at the same time as a DSA.  Surgical clipping. During surgery, a clip is placed at the base of the aneurysm. The clip prevents blood from continuing to enter the aneurysm.  Flow diversion. This procedure is used to divert blood flow around the aneurysm with a stent that is placed across the opening of an aneurysm. Ruptured aneurysm Immediate  emergency surgery or coiling may be needed to help prevent damage to the brain and to reduce the risk of rebleeding. The timing of treatment is an important factor in preventing complications. Successful early treatment of a ruptured aneurysm within the first 3 days of a bleed helps to prevent rebleeding and blood vessel spasm. In some cases, there may be a reason to treat 10-14 days after a rupture. Many factors are considered when making this decision, and each case is handled individually. Follow these instructions at home: If your aneurysm is not treated:  Take over-the-counter and prescription medicines only as told by your health care provider.  Follow a diet suggested by your health care provider. Certain dietary changes may be advised to address high blood pressure (hypertension), such as choosing foods that are low in salt (sodium), saturated fat, trans fat, and cholesterol.  Stay physically active. It is recommended that you get at least 30 minutes of activity on most or all days.  Do not use any products that contain nicotine or tobacco, such as cigarettes and e-cigarettes. If you need help quitting, ask your health care provider.  Limit alcohol intake to no more than 1 drink a day for nonpregnant women and 2 drinks a  day for men. One drink equals 12 oz of beer, 5 oz of wine, or 1 oz of hard liquor.  Do not use street drugs. If you need help quitting, ask your health care provider.  Keep all follow-up visits as told by your health care provider. This is important. This includes any referrals, imaging studies, and laboratory tests. Proper follow-up may prevent an aneurysm rupture or a stroke. Get help right away if:  You have a sudden, severe headache with no known cause. This may include a stiff neck.  You have sudden nausea or vomiting with a severe headache.  You have a seizure.  You have other symptoms of stroke. The acronym BEFAST is an easy way to remember the main warning  signs of stroke. ? B = Balance problems. Signs include dizziness, sudden trouble walking, or loss of balance. ? E = Eye problems. This includes trouble seeing or a sudden change in vision. ? F = Face changes. This includes sudden weakness or numbness of the face, or the face or eyelid drooping to one side. ? A = Arm weakness or numbness. This happens suddenly and usually on one side of the body. ? S = Speech problems. This includes trouble speaking or trouble understanding. ? T = Time. Time to call 911 or seek emergency care. Do not wait to see if symptoms will go away. Make note of the time your symptoms started. These symptoms may represent a serious problem that is an emergency. Do not wait to see if the symptoms will go away. Get medical help right away. Call your local emergency services (911 in the [place] Do not drive yourself to the hospital. Summary  An aneurysm is a bulge in an artery.  Aneurysms are dangerous because they can leak or burst (rupture). When a cerebral aneurysm ruptures, it causes bleeding in the brain.  Treatment depends on whether the aneurysm is ruptured. A ruptured aneurysm is a medical emergency.  Get help right away if you have symptoms of stroke. The acronym BEFAST is an easy way to remember the main warning signs of stroke. This information is not intended to replace advice given to you by your health care provider. Make sure you discuss any questions you have with your health care provider. Document Released: 05/08/2002 Document Revised: 05/05/2018 Document Reviewed: 09/23/2016 Elsevier Patient Education  2020 [employer].

## 2019-07-30 NOTE — Progress Notes (Signed)
[facility] [address] [place]. [place] [address] [phone]       HOSPITAL FOLLOW UP NOTE  Ms. Ermalene Searing Cantave Date of Birth:  [birth year] Medical Record Number:  [id number]   Reason for Referral:  hospital stroke follow up    CHIEF COMPLAINT:  Chief Complaint  Patient presents with   Hospitalization Follow-up    Alone. Rm 9. No new concerns at this time.     HPI: Rayneisha Bouza Baggettis being seen today for in office hospital follow-up regarding posterior corona radiata/PL IC infarct and AchA territory infarct on 05/23/2019.  History obtained from patient and chart review. Reviewed all radiology images and labs personally.  Ms. TEIRRA CARAPIA is a 53 y.o. female with history of of hypertension, depression, hypercholesterolemia, ulcerative colitis, irritable bowel, B12 deficiency, DVT in the past not on anticoagulation, asthma and concern for pulmonary fibrosis presented on 05/23/19 with L sided weakness.  Stroke work-up showed a posterior corona radiata/PL IC infarct and AchA territory infarct as evidenced on MRI likely secondary to small vessel disease source.  MRA head negative for LVO but did show right ACA A2 2 mm aneurysm.  MRA neck unremarkable.  2D echo unremarkable.  Due to AchA territory infarct, recommended 30-day cardiac event monitoring to rule out atrial fibrillation.  Recommended DAPT for 3 weeks and aspirin alone.  HTN stable.  LDL 166 and increase atorvastatin dose to 80 mg daily.  A1c 6.2 with a prior history of DM and recommended close PCP follow-up.  History of left posterior tibial vein DVT in 08/2014 contributing to estrogen use and smoking treated with Xarelto course.  Other stroke risk factors include former tobacco use, alcohol use and morbid obesity but no prior history of stroke.  Other active include thrombocytosis platelet 499, depression with anxiety and Wellbutrin and Celexa and ulcerative colitis on mesalamine.  Residual deficits of left hemiparesis  and discharged to CIR on 05/25/2019 for ongoing therapy.  Discharged home on 06/06/2019 with recommendation of outpatient therapy.   Ms. Raczynski is a 53 year old female who is being seen today for hospital follow-up.  Residual deficits of mild left-sided weakness but overall great improvement.  She continues to work with outpatient therapies.  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  She is able to ambulate with a cane for long distance but is unassisted at home.  Denies any recent falls.  Completed 3 weeks DAPT and continues on aspirin alone without bleeding or bruising.  Continues on atorvastatin 80 mg daily without myalgias.  Reports recent lab work by PCP lipid panel satisfactory.  Blood pressure today 126/78.  Returned cardiac event monitor to cardiology yesterday completed 3 weeks on a 4-week recommendation due to skin irritation.  Reports stable depression/anxiety with ongoing use of Wellbutrin and Celexa.  No further concerns at this time.   ROS:   14 system review of systems performed and negative with exception of weakness  PMH:  Past Medical History:  Diagnosis Date   Abdominal pain, unspecified site    Allergic rhinitis    Anxiety    Asthma    Depression    DVT (deep venous thrombosis) ([place]) 2016   Fibroid uterus    GERD (gastroesophageal reflux disease)    Hypercholesterolemia    Hypertension    Internal hemorrhoid    Irritable bowel syndrome    Lymphocytic colitis 01/02/2015   Pneumonia 1998   Stroke ([place])  Ulcerative colitis ([place])    URI (upper respiratory infection)    Uterine fibroid    Vitamin B12 deficiency     PSH:  Past Surgical History:  Procedure Laterality Date   CESAREAN SECTION  2004   CHOLECYSTECTOMY     laparoscopic "gallstones"   ESOPHAGOGASTRODUODENOSCOPY (EGD) WITH PROPOFOL N/A 05/03/2014   Procedure: ESOPHAGOGASTRODUODENOSCOPY (EGD) WITH PROPOFOL;  Surgeon:  Inda Castle, MD;  Location: [facility];  Service: Endoscopy;  Laterality: N/A;   MYOMECTOMY     UTERINE FIBROID SURGERY      Social History:  Social History   Socioeconomic History   Marital status: Married    Spouse name: Not on file   Number of children: 1   Years of education: Not on file   Highest education level: Not on file  Occupational History   Occupation: Admin. assistant  Social Needs   Emergency planning/management officer strain: Not on file   Food insecurity    Worry: Not on file    Inability: Not on file   Transportation needs    Medical: Not on file    Non-medical: Not on file  Tobacco Use   Smoking status: Former Smoker    Packs/day: 0.10    Years: 10.00    Pack years: 1.00    Types: Cigarettes    Quit date: 12/07/2017    Years since quitting: 1.6   Smokeless tobacco: Never Used  Substance and Sexual Activity   Alcohol use: Yes    Alcohol/week: 5.0 standard drinks    Types: 5 Cans of beer per week   Drug use: No   Sexual activity: Yes    Birth control/protection: Pill  Lifestyle   Physical activity    Days per week: Not on file    Minutes per session: Not on file   Stress: Not on file  Relationships   Social connections    Talks on phone: Not on file    Gets together: Not on file    Attends religious service: Not on file    Active member of club or organization: Not on file    Attends meetings of clubs or organizations: Not on file    Relationship status: Not on file   Intimate partner violence    Fear of current or ex partner: Not on file    Emotionally abused: Not on file    Physically abused: Not on file    Forced sexual activity: Not on file  Other Topics Concern   Not on file  Social History Narrative   Not on file    Family History:  Family History  Problem Relation Age of Onset   Pulmonary fibrosis Father    Colon cancer Paternal Aunt    Uterine cancer Paternal Grandmother        with mets to the colon    Heart  failure Maternal Grandmother    Heart disease Paternal Uncle    Heart disease Paternal Aunt    Kidney disease Cousin    Hyperlipidemia Mother    Mitral valve prolapse Mother    Heart disease Mother        arrhythmia    Medications:   Current Outpatient Medications on File Prior to Visit  Medication Sig Dispense Refill   acetaminophen (TYLENOL) 325 MG tablet Take 2 tablets (650 mg total) by mouth every 4 (four) hours as needed for mild pain (or temp > 37.5 C (99.5 F)). (Patient taking differently: Take 650 mg by mouth as needed  for mild pain (or temp > 37.5 C (99.5 F)). )     aspirin EC 81 MG EC tablet Take 1 tablet (81 mg total) by mouth daily. 30 tablet 0   atorvastatin (LIPITOR) 80 MG tablet Take 1 tablet (80 mg total) by mouth daily at 6 PM. 30 tablet 0   buPROPion (WELLBUTRIN XL) 300 MG 24 hr tablet Take 1 tablet (300 mg total) by mouth daily. 90 tablet 3   citalopram (CELEXA) 20 MG tablet Take 1 tablet (20 mg total) by mouth daily. 90 tablet 3   fexofenadine (ALLEGRA) 180 MG tablet Take 180 mg by mouth daily as needed.      loperamide (IMODIUM) 2 MG capsule Take 1 capsule (2 mg total) by mouth as needed for diarrhea or loose stools. 30 capsule 0   mesalamine (LIALDA) 1.2 g EC tablet TAKE 2 TABLETS (2.4 G TOTAL) BY MOUTH 2 (TWO) TIMES DAILY. 60 tablet 1   methocarbamol (ROBAXIN) 500 MG tablet Take 1 tablet (500 mg total) by mouth every 6 (six) hours as needed for muscle spasms. 30 tablet 3   pantoprazole (PROTONIX) 40 MG tablet Take 1 tablet (40 mg total) by mouth daily. 90 tablet 3   saccharomyces boulardii (FLORASTOR) 250 MG capsule Take 1 capsule (250 mg total) by mouth 2 (two) times daily. 60 capsule 0   zolpidem (AMBIEN CR) 12.5 MG CR tablet TAKE 1 TABLET (12.5 MG TOTAL) BY MOUTH AT BEDTIME AS NEEDED FOR SLEEP (Patient taking differently: Take 2.25-12.5 mg by mouth at bedtime as needed for sleep. ) 30 tablet 3   albuterol (PROVENTIL HFA;VENTOLIN HFA) 108 (90 Base)  MCG/ACT inhaler Inhale 2 puffs into the lungs every 4 (four) hours as needed for wheezing or shortness of breath (cough, shortness of breath or wheezing.). (Patient not taking: Reported on 07/30/2019) 1 Inhaler 1   clopidogrel (PLAVIX) 75 MG tablet Take 1 tablet (75 mg total) by mouth daily. (Patient not taking: Reported on 07/30/2019) 10 tablet 0   No current facility-administered medications on file prior to visit.     Allergies:   Allergies  Allergen Reactions   Sertraline Hcl     REACTION: diarrhea   Zoloft [Sertraline Hcl] Diarrhea     Physical Exam  Vitals:   07/30/19 1404  BP: 126/78  Pulse: 81  Temp: 97.9 F (36.6 C)  TempSrc: Oral  Weight: 213 lb (96.6 kg)  Height: 5' 3"  (1.6 m)   Body mass index is 37.73 kg/m. No exam data present  Depression screen [facility] 2/9 07/30/2019  Decreased Interest 0  Down, Depressed, Hopeless 0  PHQ - 2 Score 0  Altered sleeping -  Tired, decreased energy -  Change in appetite -  Feeling bad or failure about yourself  -  Trouble concentrating -  Moving slowly or fidgety/restless -  Suicidal thoughts -  PHQ-9 Score -  Some recent data might be hidden     General: well developed, well nourished,  pleasant middle-age Caucasian female, seated, in no evident distress Head: head normocephalic and atraumatic.   Neck: supple with no carotid or supraclavicular bruits Cardiovascular: regular rate and rhythm, no murmurs Musculoskeletal: no deformity Skin:  no rash/petichiae Vascular:  Normal pulses all extremities   Neurologic Exam Mental Status: Awake and fully alert. Oriented to place and time. Recent and remote memory intact. Attention span, concentration and fund of knowledge appropriate. Mood and affect appropriate.  Cranial Nerves: Fundoscopic exam reveals sharp disc margins. Pupils equal, briskly reactive to  light. Extraocular movements full without nystagmus. Visual fields full to confrontation. Hearing intact. Facial sensation  intact. Face, tongue, palate moves normally and symmetrically.  Motor: Normal bulk and tone. Normal strength in all tested extremity muscles. LUE: 4/5 deltoid and decreased left hand finger tapping LLE: Mildly weak hip flexor weakness and ankle dorsiflexion Full strength right upper and lower extremity Sensory.: intact to touch , pinprick , position and vibratory sensation.  Coordination: Rapid alternating movements normal in all extremities except slightly decreased left hand. Finger-to-nose and heel-to-shin performed accurately bilaterally. Gait and Station: Arises from chair without difficulty. Stance is normal. Gait demonstrates  hemiplegic and steppage gait Reflexes: 1+ and symmetric. Toes downgoing.     NIHSS  0 Modified Rankin  2    Diagnostic Data (Labs, Imaging, Testing)  CT HEAD WO CONTRAST 05/23/2019 IMPRESSION: 1. Invagination of CSF into the sella, a finding of doubtful clinical significance. Ventricles normal in size and configuration.  2.  Brain parenchyma appears unremarkable.  No mass or hemorrhage.  3.  Mild mucosal thickening in several ethmoid air cells.  MR BRAIN WO CONTRAST MR MRA HEAD 05/23/2019 IMPRESSION: MRI brain: 1. Motion degraded examination. 2. 2.1 x 0.7 cm acute infarct involving right corona radiata/internal capsule and extending into the right basal ganglia. 3. Mild chronic small vessel ischemic disease. MRA head: 1. Motion degraded examination. 2. No evidence of intracranial large vessel occlusion or high-grade proximal arterial stenosis. 3. A 2 mm aneurysm arising the A2 right anterior cerebral artery is questioned, although evaluation is somewhat limited due to motion degradation. Consider follow-up CT or MR angiography.  MR MRA NECK 05/23/2019 IMPRESSION: Negative MRA of the neck. No hemodynamically significant or critical flow limiting stenosis.  ECHOCARDIOGRAM 05/24/2019 IMPRESSIONS  1. Left ventricular ejection fraction, by  visual estimation, is 60 to 65%. The left ventricle has normal function. Normal left ventricular size. There is mildly increased left ventricular hypertrophy.  2. Left ventricular diastolic Doppler parameters are consistent with pseudonormalization pattern of LV diastolic filling.  3. The mitral valve is normal in structure. No evidence of mitral valve regurgitation. No evidence of mitral stenosis.  4. The aortic valve is tricuspid Aortic valve regurgitation was not visualized by color flow Doppler. Structurally normal aortic valve, with no evidence of sclerosis or stenosis.  5. Global right ventricle has normal systolic function.The right ventricular size is normal. No increase in right ventricular wall thickness.  6. Left atrial size was normal.  7. Right atrial size was normal.  8. The tricuspid valve is normal in structure. Tricuspid valve regurgitation was not visualized by color flow Doppler.  9. TR signal is inadequate for assessing pulmonary artery systolic pressure.    ASSESSMENT: ASHIMA SHRAKE is a 53 y.o. year old female presented with left-sided weakness on 05/23/2019 with stroke work-up showing right posterior corona radiata/PICA infarct and AchA territory infarct likely secondary to small vessel disease source but recommended 30 cardiac event monitor due to AchA territory infarct to rule out atrial fibrillation.  During imaging, imaging showed evidence of right ACA 2 mm aneurysm.  Vascular risk factors include HTN, HLD, history of DVT, history of tobacco use, alcohol use and obesity.  Residual deficits of mild left-sided weakness but overall greatly improving.    PLAN:  1. Ischemic stroke: Continue aspirin 81 mg daily  and atorvastatin 80 mg daily for secondary stroke prevention. Maintain strict control of hypertension with blood pressure goal below 130/90, diabetes with hemoglobin A1c goal below 6.5% and cholesterol with  LDL cholesterol (bad cholesterol) goal below 70 mg/dL.  I also  advised the patient to eat a healthy diet with plenty of whole grains, cereals, fruits and vegetables, exercise regularly with at least 30 minutes of continuous activity daily and maintain ideal body weight.  Cardiac event monitor completed total of 3 weeks due to skin irritation.  Was sent back yesterday currently awaiting results 2. HTN: Advised to continue current treatment regimen.  Today's BP 126/78.  Advised to continue to monitor at home along with continued follow-up with PCP for management 3. HLD: Advised to continue current treatment regimen along with continued follow-up with PCP for future prescribing and monitoring of lipid panel 4. Left hemiparesis: Ongoing participation with outpatient therapies for ongoing improvement 5. Right ACA 2 mm aneurysm: We will discuss repeat imaging at follow-up visit.  Additional information provided.  Due to aneurysm size and low risk of rupture without current tobacco use, uncontrolled HTN or family history, recommend surveillance monitoring at this time    Follow up in 4 months or call earlier if needed   Greater than 50% of time during this 45 minute visit was spent on counseling, explanation of diagnosis of ischemic stroke, reviewing risk factor management of HTN and HLD, planning of further management along with potential future management, and discussion with patient and family answering all questions.    Frann Rider, AGNP-BC  [facility] [address] [place], [place] [address]  Phone [phone] Fax [phone] Note: This document was prepared with digital dictation and possible smart phrase technology. Any transcriptional errors that result from this process are unintentional.

## 2019-07-31 ENCOUNTER — Encounter: Payer: Self-pay | Admitting: Physical Therapy

## 2019-07-31 ENCOUNTER — Other Ambulatory Visit: Payer: Self-pay

## 2019-07-31 ENCOUNTER — Ambulatory Visit: Payer: BC Managed Care – PPO | Attending: Physician Assistant | Admitting: Physical Therapy

## 2019-07-31 DIAGNOSIS — I639 Cerebral infarction, unspecified: Secondary | ICD-10-CM

## 2019-07-31 DIAGNOSIS — I69354 Hemiplegia and hemiparesis following cerebral infarction affecting left non-dominant side: Secondary | ICD-10-CM | POA: Diagnosis not present

## 2019-07-31 DIAGNOSIS — R278 Other lack of coordination: Secondary | ICD-10-CM | POA: Diagnosis not present

## 2019-07-31 DIAGNOSIS — R05 Cough: Secondary | ICD-10-CM | POA: Diagnosis not present

## 2019-07-31 DIAGNOSIS — M6281 Muscle weakness (generalized): Secondary | ICD-10-CM | POA: Insufficient documentation

## 2019-07-31 NOTE — Therapy (Signed)
[place] MAIN [facility] SERVICES [address] [place], [place], [address] Phone: [phone]   Fax:  [phone]  Physical Therapy Treatment  Patient Details  Name: Carol Johnson MRN: [id number] Date of Birth: March 06, 1966 Referring Provider (PT): Lauraine Rinne   Encounter Date: 07/31/2019  PT End of Session - 07/31/19 1200    Visit Number  8    Number of Visits  25    Date for PT Re-Evaluation  09/19/19    PT Start Time  2951    PT Stop Time  1225    PT Time Calculation (min)  26 min    Equipment Utilized During Treatment  Gait belt    Activity Tolerance  Patient tolerated treatment well    Behavior During Therapy  [facility] for tasks assessed/performed       Past Medical History:  Diagnosis Date  . Abdominal pain, unspecified site   . Allergic rhinitis   . Anxiety   . Asthma   . Depression   . DVT (deep venous thrombosis) ([place]) 2016  . Fibroid uterus   . GERD (gastroesophageal reflux disease)   . Hypercholesterolemia   . Hypertension   . Internal hemorrhoid   . Irritable bowel syndrome   . Lymphocytic colitis 01/02/2015  . Pneumonia 1998  . Stroke ([place])   . Ulcerative colitis ([place])   . URI (upper respiratory infection)   . Uterine fibroid   . Vitamin B12 deficiency     Past Surgical History:  Procedure Laterality Date  . CESAREAN SECTION  2004  . CHOLECYSTECTOMY     laparoscopic "gallstones"  . ESOPHAGOGASTRODUODENOSCOPY (EGD) WITH PROPOFOL N/A 05/03/2014   Procedure: ESOPHAGOGASTRODUODENOSCOPY (EGD) WITH PROPOFOL;  Surgeon: Inda Castle, MD;  Location: [facility];  Service: Endoscopy;  Laterality: N/A;  . MYOMECTOMY    . UTERINE FIBROID SURGERY      There were no vitals filed for this visit.  Subjective Assessment - 07/31/19 1159    Subjective  Pt doing good today. she assisted in cooking thanksgiving.    Pertinent History  Pt recently discharged from CIR at [facility] after having a CVA in R corona radiata and  basal ganglia and was in CIR from 05/25/19-06/06/19.    Currently in Pain?  No/denies    Pain Score  0-No pain    Pain Onset  More than a month ago         TREATMENT:  Therapeutic exercise:   Leg press 100 lbs x 3x20, seat hole 7 Leg press, heel raises 60 lbs x 20 x 3 Octane fitness x 5 mins L 2 TM x 5 mins . 9 miles / hour   Neuromuscular Re-education   Heel/toe raises without UE support 3s hold x 10 each 1/2 foam roll balance with flat side up 30s x 2 reps 1/2 foam roll balance with flat side down 30s x 2 reps 1/2 foam roll tandem balance alternating forward LE 30s x 2 each LE forward   Pt educated throughout session about proper posture and technique with exercises. Improved exercise technique, movement at target joints, use of target muscles after min to mod verbal, visual, tactile cues. CGA and Min to mod verbal cues used throughout with increased in postural sway and LOB most seen with narrow base of support and while on uneven surfaces. Continues to have balance deficits typical with diagnosis. Patient performs intermediate level exercises without pain behaviors and needs verbal cuing for postural alignment and head positioning  PT Education - 07/31/19 1200    Education Details  HEP    Person(s) Educated  Patient    Methods  Explanation    Comprehension  Verbalized understanding;Returned demonstration;Verbal cues required;Tactile cues required;Need further instruction       PT Short Term Goals - 06/27/19 1411      PT SHORT TERM GOAL #1   Title  Patient will be independent in home exercise program to improve strength/mobility for better functional independence with ADLs.    Time  6    Period  Weeks    Status  New    Target Date  08/08/19      PT SHORT TERM GOAL #2   Title  Patient (< 76 years old) will complete five times sit to stand test in < 10 seconds indicating an increased LE strength and improved balance.    Time  6     Period  Weeks    Status  New    Target Date  08/08/19        PT Long Term Goals - 06/27/19 1412      PT LONG TERM GOAL #1   Title  Patient will increase Berg Balance score by > 6 points to demonstrate decreased fall risk during functional activities.    Time  12    Period  Weeks    Status  New    Target Date  09/19/19      PT LONG TERM GOAL #2   Title  Patient will increase six minute walk test distance to >1200 for progression to community ambulator and improve gait ability    Time  12    Period  Weeks    Status  New    Target Date  09/19/19      PT LONG TERM GOAL #3   Title  Patient will increase 10 meter walk test to >1.6ms as to improve gait speed for better community ambulation and to reduce fall risk    Time  12    Period  Weeks    Status  New    Target Date  09/19/19      PT LONG TERM GOAL #4   Title  Patient will reduce timed up and go to <11 seconds to reduce fall risk and demonstrate improved transfer/gait ability.    Time  12    Period  Weeks    Status  New    Target Date  09/19/19            Plan - 07/31/19 1201    Clinical Impression Statement  Patient instructed in intermediate strengthening and balance exercise.  Patient requires min Vcs for correct exercise technique including to improve LE control with standing exercise. Patient demonstrates better quad control with SLS tasks with rail assist. Patient would benefit from additional skilled PT intervention to improve balance/gait safety and reduce fall risk.   Stability/Clinical Decision Making  Stable/Uncomplicated    Rehab Potential  Good    PT Frequency  2x / week    PT Duration  12 weeks    PT Treatment/Interventions  Gait training;Stair training;Neuromuscular re-education;Balance training;Therapeutic exercise;Manual techniques;Dry needling;Passive range of motion    PT Next Visit Plan  continue to progress program    PT Home Exercise Plan  HEP updated this session, HEP code: [id number]     Consulted and Agree with Plan of Care  Patient       Patient will benefit from skilled therapeutic intervention in order to  improve the following deficits and impairments:  Abnormal gait, Decreased strength, Decreased activity tolerance  Visit Diagnosis: Muscle weakness (generalized)  Other lack of coordination  Hemiplegia and hemiparesis following cerebral infarction affecting left non-dominant side (HCC)  Small vessel cerebrovascular accident (CVA) [facility])     Problem List Patient Active Problem List   Diagnosis Date Noted  . Hemiparesis affecting nondominant side as late effect of stroke ([place]) 07/24/2019  . Muscle weakness (generalized) 06/11/2019  . Hemiplegia and hemiparesis following cerebral infarction affecting left non-dominant side ([place]) 06/11/2019  . Major depressive disorder   . Hypokalemia   . Acute blood loss anemia   . Ulcerative colitis with complication ([place])   . H/O: CVA (cerebrovascular accident) 05/23/2019  . Thrombocytosis ([place]) 05/23/2019  . Coronary artery disease involving native coronary artery of native heart without angina pectoris 03/21/2018  . Coronary artery calcification 02/10/2018  . Obesity (BMI 30-39.9) 02/10/2018  . Former smoker 01/15/2018  . Dyspnea on exertion 12/21/2017  . Carpal tunnel syndrome 12/21/2017  . Stress incontinence 12/21/2017  . Paresthesia of both feet 07/04/2017  . Always thirsty 07/04/2017  . Abdominal bloating 12/01/2015  . Fatigue 12/01/2015  . Vitamin B12 deficiency 12/01/2015  . Lymphocytic colitis 01/16/2015  . Anemia, iron deficiency 01/16/2015  . Anxiety state 12/20/2014  . Diarrhea 11/27/2014  . Insomnia 11/27/2014  . History of DVT (deep vein thrombosis) 09/17/2014  . OSA (obstructive sleep apnea) 09/02/2014  . Gastritis, chronic 06/06/2014  . Unspecified gastritis and gastroduodenitis without mention of hemorrhage 05/03/2014  . Abdominal pain, epigastric 04/19/2014  . Internal hemorrhoids with other  complication [id number]/9528  . Routine general medical examination at a health care facility 10/14/2011  . Thyroid nodule 05/16/2011  . Rectal bleeding 11/24/2010  . Hemorrhoids, internal 11/24/2010  . Irritable bowel syndrome (IBS) 11/24/2010  . ANEMIA 10/02/2009  . Hyperlipidemia 07/18/2009  . Essential hypertension 07/18/2009  . DYSPNEA ON EXERTION 07/18/2009  . THYROID CYST 01/31/2008  . ANXIETY 01/31/2008  . Depression with anxiety 01/31/2008  . GASTRIC ULCER, HX OF 01/31/2008  . FIBROIDS, UTERUS 11/13/2007  . ALLERGIC RHINITIS 11/13/2007  . ASTHMA 11/13/2007  . GERD 11/13/2007  . IRRITABLE BOWEL SYNDROME 11/13/2007  . INTERNAL HEMORRHOIDS 11/12/2003    Alanson Puls, PT DPT 07/31/2019, 12:02 PM  [place] MAIN [facility] SERVICES [address] [place], [place], [address] Phone: [phone]   Fax:  [phone]  Name: WALSIE SMELTZ MRN: [id number] Date of Birth: 1966-03-17

## 2019-08-01 ENCOUNTER — Other Ambulatory Visit: Payer: Self-pay | Admitting: Family Medicine

## 2019-08-01 NOTE — Progress Notes (Signed)
I agree with the above plan 

## 2019-08-01 NOTE — Telephone Encounter (Signed)
Rx refilled.

## 2019-08-01 NOTE — Telephone Encounter (Signed)
Pharmacy called wanted to make sure we received request and to let us know patient is out of medicine.

## 2019-08-02 ENCOUNTER — Other Ambulatory Visit: Payer: Self-pay | Admitting: Cardiology

## 2019-08-02 ENCOUNTER — Ambulatory Visit: Payer: BC Managed Care – PPO | Admitting: Physical Therapy

## 2019-08-02 DIAGNOSIS — I639 Cerebral infarction, unspecified: Secondary | ICD-10-CM

## 2019-08-02 DIAGNOSIS — I63031 Cerebral infarction due to thrombosis of right carotid artery: Secondary | ICD-10-CM

## 2019-08-02 DIAGNOSIS — I4891 Unspecified atrial fibrillation: Secondary | ICD-10-CM

## 2019-08-07 ENCOUNTER — Encounter: Payer: Self-pay | Admitting: Gastroenterology

## 2019-08-07 ENCOUNTER — Encounter: Payer: Self-pay | Admitting: Physical Therapy

## 2019-08-07 ENCOUNTER — Ambulatory Visit: Payer: BC Managed Care – PPO | Admitting: Occupational Therapy

## 2019-08-07 ENCOUNTER — Other Ambulatory Visit: Payer: Self-pay

## 2019-08-07 ENCOUNTER — Encounter: Payer: Self-pay | Admitting: Occupational Therapy

## 2019-08-07 ENCOUNTER — Ambulatory Visit: Payer: BC Managed Care – PPO | Admitting: Physical Therapy

## 2019-08-07 ENCOUNTER — Ambulatory Visit: Payer: BC Managed Care – PPO | Admitting: Gastroenterology

## 2019-08-07 VITALS — BP 122/84 | HR 79 | Temp 98.5°F | Ht 63.0 in | Wt 209.5 lb

## 2019-08-07 DIAGNOSIS — M6281 Muscle weakness (generalized): Secondary | ICD-10-CM | POA: Diagnosis not present

## 2019-08-07 DIAGNOSIS — I69354 Hemiplegia and hemiparesis following cerebral infarction affecting left non-dominant side: Secondary | ICD-10-CM

## 2019-08-07 DIAGNOSIS — K580 Irritable bowel syndrome with diarrhea: Secondary | ICD-10-CM

## 2019-08-07 DIAGNOSIS — I639 Cerebral infarction, unspecified: Secondary | ICD-10-CM | POA: Diagnosis not present

## 2019-08-07 DIAGNOSIS — K52832 Lymphocytic colitis: Secondary | ICD-10-CM

## 2019-08-07 DIAGNOSIS — E739 Lactose intolerance, unspecified: Secondary | ICD-10-CM | POA: Diagnosis not present

## 2019-08-07 DIAGNOSIS — R278 Other lack of coordination: Secondary | ICD-10-CM | POA: Diagnosis not present

## 2019-08-07 DIAGNOSIS — R05 Cough: Secondary | ICD-10-CM | POA: Diagnosis not present

## 2019-08-07 MED ORDER — COLESTIPOL HCL 1 G PO TABS: 1.0000 g | ORAL_TABLET | Freq: Two times a day (BID) | ORAL | 1 refills | Status: DC

## 2019-08-07 NOTE — Progress Notes (Signed)
Carol Johnson    [id number]    1966/05/07  Primary Care Physician:Tower, Wynelle Fanny, MD  Referring Physician: Tower, Wynelle Fanny, MD [place],  [place] [address]   Chief complaint: Diarrhea  HPI: 53 year old female with history of lymphocytic colitis diagnosed in 2016.  She was treated with budesonide for a year by Dr. Deatra Ina, was tapered off in 2017.  Dr. Deatra Ina also started on Lialda for lymphocytic colitis.  She has been taking it since then 4.8 g daily. She was doing well until she had stroke in September 2020.  Subsequent to stroke her bowel frequency has changed and currently she is having 3-4 semiformed bowel movements daily.  She has to take Imodium once a day.  She has increased fecal urgency.  Denies any nocturnal symptoms. Denies any nausea, vomiting, abdominal pain, melena or bright red blood per rectum  No family history of colon cancer or IBD   Outpatient Encounter Medications as of 08/07/2019  Medication Sig  . acetaminophen (TYLENOL) 325 MG tablet Take 2 tablets (650 mg total) by mouth every 4 (four) hours as needed for mild pain (or temp > 37.5 C (99.5 F)). (Patient taking differently: Take 650 mg by mouth as needed for mild pain (or temp > 37.5 C (99.5 F)). )  . albuterol (PROVENTIL HFA;VENTOLIN HFA) 108 (90 Base) MCG/ACT inhaler Inhale 2 puffs into the lungs every 4 (four) hours as needed for wheezing or shortness of breath (cough, shortness of breath or wheezing.).  Marland Kitchen aspirin EC 81 MG EC tablet Take 1 tablet (81 mg total) by mouth daily.  Marland Kitchen atorvastatin (LIPITOR) 80 MG tablet TAKE 1 TABLET (80 MG TOTAL) BY MOUTH DAILY AT 6 PM.  . buPROPion (WELLBUTRIN XL) 300 MG 24 hr tablet Take 1 tablet (300 mg total) by mouth daily.  . citalopram (CELEXA) 20 MG tablet Take 1 tablet (20 mg total) by mouth daily.  . fexofenadine (ALLEGRA) 180 MG tablet Take 180 mg by mouth daily as needed.   . loperamide (IMODIUM) 2 MG capsule Take 1 capsule (2 mg total) by  mouth as needed for diarrhea or loose stools.  . mesalamine (LIALDA) 1.2 g EC tablet TAKE 2 TABLETS (2.4 G TOTAL) BY MOUTH 2 (TWO) TIMES DAILY. (Patient taking differently: Take 2.4 g by mouth 2 (two) times daily. Pt states she forgets to take evening dose at times)  . methocarbamol (ROBAXIN) 500 MG tablet Take 1 tablet (500 mg total) by mouth every 6 (six) hours as needed for muscle spasms.  . pantoprazole (PROTONIX) 40 MG tablet Take 1 tablet (40 mg total) by mouth daily.  Marland Kitchen saccharomyces boulardii (FLORASTOR) 250 MG capsule Take 1 capsule (250 mg total) by mouth 2 (two) times daily.  Marland Kitchen zolpidem (AMBIEN CR) 12.5 MG CR tablet TAKE 1 TABLET (12.5 MG TOTAL) BY MOUTH AT BEDTIME AS NEEDED FOR SLEEP (Patient taking differently: Take 2.25-12.5 mg by mouth at bedtime as needed for sleep. )  . [DISCONTINUED] clopidogrel (PLAVIX) 75 MG tablet Take 1 tablet (75 mg total) by mouth daily. (Patient not taking: Reported on 08/07/2019)   No facility-administered encounter medications on file as of 08/07/2019.     Allergies as of 08/07/2019 - Review Complete 08/07/2019  Allergen Reaction Noted  . Sertraline hcl  01/31/2008  . Zoloft [sertraline hcl] Diarrhea 04/19/2014    Past Medical History:  Diagnosis Date  . Abdominal pain, unspecified site   . Allergic  rhinitis   . Anxiety   . Asthma   . Depression   . DVT (deep venous thrombosis) ([place]) 2016  . Fibroid uterus   . GERD (gastroesophageal reflux disease)   . Hypercholesterolemia   . Hypertension   . Internal hemorrhoid   . Irritable bowel syndrome   . Lymphocytic colitis 01/02/2015  . Pneumonia 1998  . Stroke ([place])   . Ulcerative colitis ([place])   . URI (upper respiratory infection)   . Uterine fibroid   . Vitamin B12 deficiency     Past Surgical History:  Procedure Laterality Date  . CESAREAN SECTION  2004  . CHOLECYSTECTOMY     laparoscopic "gallstones"  . ESOPHAGOGASTRODUODENOSCOPY (EGD) WITH PROPOFOL N/A 05/03/2014   Procedure:  ESOPHAGOGASTRODUODENOSCOPY (EGD) WITH PROPOFOL;  Surgeon: Inda Castle, MD;  Location: [facility];  Service: Endoscopy;  Laterality: N/A;  . MYOMECTOMY    . UTERINE FIBROID SURGERY      Family History  Problem Relation Age of Onset  . Pulmonary fibrosis Father   . Colon cancer Paternal Aunt   . Uterine cancer Paternal Grandmother        with mets to the colon   . Heart failure Maternal Grandmother   . Heart disease Paternal Uncle   . Heart disease Paternal Aunt   . Kidney disease Cousin   . Hyperlipidemia Mother   . Mitral valve prolapse Mother   . Heart disease Mother        arrhythmia    Social History   Socioeconomic History  . Marital status: Legally Separated    Spouse name: Not on file  . Number of children: 1  . Years of education: Not on file  . Highest education level: Not on file  Occupational History  . Occupation: Admin. assistant  Social Needs  . Financial resource strain: Not on file  . Food insecurity    Worry: Not on file    Inability: Not on file  . Transportation needs    Medical: Not on file    Non-medical: Not on file  Tobacco Use  . Smoking status: Former Smoker    Packs/day: 0.10    Years: 10.00    Pack years: 1.00    Types: Cigarettes    Quit date: 12/07/2017    Years since quitting: 1.6  . Smokeless tobacco: Never Used  Substance and Sexual Activity  . Alcohol use: Not Currently    Alcohol/week: 5.0 standard drinks    Types: 5 Cans of beer per week  . Drug use: No  . Sexual activity: Yes    Birth control/protection: Pill  Lifestyle  . Physical activity    Days per week: Not on file    Minutes per session: Not on file  . Stress: Not on file  Relationships  . Social Herbalist on phone: Not on file    Gets together: Not on file    Attends religious service: Not on file    Active member of club or organization: Not on file    Attends meetings of clubs or organizations: Not on file    Relationship status: Not on file   . Intimate partner violence    Fear of current or ex partner: Not on file    Emotionally abused: Not on file    Physically abused: Not on file    Forced sexual activity: Not on file  Other Topics Concern  . Not on file  Social History Narrative  .  Not on file      Review of systems: Review of Systems  Constitutional: Negative for fever and chills.  HENT: Negative.   Eyes: Negative for blurred vision.  Respiratory: Negative for cough, shortness of breath and wheezing.   Cardiovascular: Negative for chest pain and palpitations.  Gastrointestinal: as per HPI Genitourinary: Negative for dysuria, urgency, frequency and hematuria.  Musculoskeletal: Positive for myalgias, back pain and joint pain.  Skin: Negative for itching and rash.  Neurological: Negative for dizziness, tremors, focal weakness, seizures and loss of consciousness.  Endo/Heme/Allergies: Positive for seasonal allergies.  Psychiatric/Behavioral: Negative for depression, suicidal ideas and hallucinations. Insomnia All other systems reviewed and are negative.   Physical Exam: Vitals:   08/07/19 1421  BP: 122/84  Pulse: 79  Temp: 98.5 F (36.9 C)   Body mass index is 37.11 kg/m. Gen:      No acute distress HEENT:  EOMI, sclera anicteric Neck:     No masses; no thyromegaly Lungs:    Clear to auscultation bilaterally; normal respiratory effort CV:         Regular rate and rhythm; no murmurs Abd:      + bowel sounds; soft, non-tender; no palpable masses, no distension Ext:    No edema; adequate peripheral perfusion Skin:      Warm and dry; no rash Neuro: alert and oriented x 3 Psych: normal mood and affect  Data Reviewed:  Reviewed labs, radiology imaging, old records and pertinent past GI work up   Assessment and Plan/Recommendations:  53 year old female with history of microscopic colitis and irritable bowel syndrome predominant diarrhea Advised patient to do a trial of lactose-free diet for 1 week to  exclude lactose intolerance exacerbating her symptoms We will plan to taper off Lialda, decreased to 2.4 g daily for 1 week, subsequently to 1.2 g for 1 week and stop.  No scientific data or evidence to support long-term use of Lialda or mesalamine based therapy for lymphocytic colitis.  If continues to have persistent diarrhea despite lactose-free diet, advised patient to start taking Colestid 1 g twice daily and will also do additional trial of Creon/pancreatic lipase 1 capsule 3 times daily with meals  Return in 4 to 6 weeks or sooner if needed  25 minutes was spent face-to-face with the patient. Greater than 50% of the time used for counseling as well as treatment plan and follow-up. She had multiple questions which were answered to her satisfaction  K. Denzil Magnuson , MD    CC: Tower, Wynelle Fanny, MD

## 2019-08-07 NOTE — Patient Instructions (Addendum)
Decrease Lialda to 2 tablets daily for 1 week then 1 tablet daily for 1 week  START Colestid 1 gm twice daily after stopping Lialda  Trail of Creon 1 capsule (36,000 units) with meals three times a day (Samples given)   Lactose-Free Diet, Adult If you have lactose intolerance, you are not able to digest lactose. Lactose is a natural sugar found mainly in dairy milk and dairy products. You may need to avoid all foods and beverages that contain lactose. A lactose-free diet can help you do this. Which foods have lactose? Lactose is found in dairy milk and dairy products, such as:  Yogurt.  Cheese.  Butter.  Margarine.  Sour cream.  Cream.  Whipped toppings and nondairy creamers.  Ice cream and other dairy-based desserts. Lactose is also found in foods or products made with dairy milk or milk ingredients. To find out whether a food contains dairy milk or a milk ingredient, look at the ingredients list. Avoid foods with the statement "May contain milk" and foods that contain:  Milk powder.  Whey.  Curd.  Caseinate.  Lactose.  Lactalbumin.  Lactoglobulin. What are alternatives to dairy milk and foods made with milk products?  Lactose-free milk.  Soy milk with added calcium and vitamin D.  Almond milk, coconut milk, rice milk, or other nondairy milk alternatives with added calcium and vitamin D. Note that these are low in protein.  Soy products, such as soy yogurt, soy cheese, soy ice cream, and soy-based sour cream.  Other nut milk products, such as almond yogurt, almond cheese, cashew yogurt, cashew cheese, cashew ice cream, coconut yogurt, and coconut ice cream. What are tips for following this plan?  Do not consume foods, beverages, vitamins, minerals, or medicines containing lactose. Read ingredient lists carefully.  Look for the words "lactose-free" on labels.  Use lactase enzyme drops or tablets as directed by your health care provider.  Use lactose-free  milk or a milk alternative, such as soy milk or almond milk, for drinking and cooking.  Make sure you get enough calcium and vitamin D in your diet. A lactose-free eating plan can be lacking in these important nutrients.  Take calcium and vitamin D supplements as directed by your health care provider. Talk to your health care provider about supplements if you are not able to get enough calcium and vitamin D from food. What foods can I eat?  Fruits All fresh, canned, frozen, or dried fruits that are not processed with lactose. Vegetables All fresh, frozen, and canned vegetables without cheese, cream, or butter sauces. Grains Any that are not made with dairy milk or dairy products. Meats and other proteins Any meat, fish, poultry, and other protein sources that are not made with dairy milk or dairy products. Soy cheese and yogurt. Fats and oils Any that are not made with dairy milk or dairy products. Beverages Lactose-free milk. Soy, rice, or almond milk with added calcium and vitamin D. Fruit and vegetable juices. Sweets and desserts Any that are not made with dairy milk or dairy products. Seasonings and condiments Any that are not made with dairy milk or dairy products. Calcium Calcium is found in many foods that contain lactose and is important for bone health. The amount of calcium you need depends on your age:  Adults younger than 50 years: 1,000 mg of calcium a day.  Adults older than 50 years: 1,200 mg of calcium a day. If you are not getting enough calcium, you may get it from  other sources, including:  Orange juice with calcium added. There are 300-350 mg of calcium in 1 cup of orange juice.  Calcium-fortified soy milk. There are 300-400 mg of calcium in 1 cup of calcium-fortified soy milk.  Calcium-fortified rice or almond milk. There are 300 mg of calcium in 1 cup of calcium-fortified rice or almond milk.  Calcium-fortified breakfast cereals. There are 100-1,000 mg of  calcium in calcium-fortified breakfast cereals.  Spinach, cooked. There are 145 mg of calcium in  cup of cooked spinach.  Edamame, cooked. There are 130 mg of calcium in  cup of cooked edamame.  Collard greens, cooked. There are 125 mg of calcium in  cup of cooked collard greens.  Kale, frozen or cooked. There are 90 mg of calcium in  cup of cooked or frozen kale.  Almonds. There are 95 mg of calcium in  cup of almonds.  Broccoli, cooked. There are 60 mg of calcium in 1 cup of cooked broccoli. The items listed above may not be a complete list of recommended foods and beverages. Contact a dietitian for more options. What foods are not recommended? Fruits None, unless they are made with dairy milk or dairy products. Vegetables None, unless they are made with dairy milk or dairy products. Grains Any grains that are made with dairy milk or dairy products. Meats and other proteins None, unless they are made with dairy milk or dairy products. Dairy All dairy products, including milk, goat's milk, buttermilk, kefir, acidophilus milk, flavored milk, evaporated milk, condensed milk, dulce de [place], eggnog, yogurt, cheese, and cheese spreads. Fats and oils Any that are made with milk or milk products. Margarines and salad dressings that contain milk or cheese. Cream. Half and half. Cream cheese. Sour cream. Chip dips made with sour cream or yogurt. Beverages Hot chocolate. Cocoa with lactose. Instant iced teas. Powdered fruit drinks. Smoothies made with dairy milk or yogurt. Sweets and desserts Any that are made with milk or milk products. Seasonings and condiments Chewing gum that has lactose. Spice blends if they contain lactose. Artificial sweeteners that contain lactose. Nondairy creamers. The items listed above may not be a complete list of foods and beverages to avoid. Contact a dietitian for more information. Summary  If you are lactose intolerant, it means that you have a hard  time digesting lactose, a natural sugar found in milk and milk products.  Following a lactose-free diet can help you manage this condition.  Calcium is important for bone health and is found in many foods that contain lactose. Talk with your health care provider about other sources of calcium. This information is not intended to replace advice given to you by your health care provider. Make sure you discuss any questions you have with your health care provider. Document Released: 02/05/2002 Document Revised: 09/13/2017 Document Reviewed: 09/13/2017 Elsevier Patient Education  [place].  I appreciate the  opportunity to care for you  Thank You   Harl Bowie , MD

## 2019-08-07 NOTE — Therapy (Addendum)
[place] MAIN [facility] SERVICES [address] [place], [place], [address] Phone: [phone]   Fax:  [phone]  Physical Therapy Treatment  Patient Details  Name: Carol Johnson MRN: [id number] Date of Birth: 02-10-1966 Referring Provider (PT): Lauraine Rinne   Encounter Date: 08/07/2019  PT End of Session - 08/07/19 1006    Visit Number  9    Number of Visits  25    Date for PT Re-Evaluation  09/19/19    PT Start Time  6950    PT Stop Time  1100    PT Time Calculation (min)  45 min    Equipment Utilized During Treatment  Gait belt    Activity Tolerance  Patient tolerated treatment well    Behavior During Therapy  [facility] for tasks assessed/performed       Past Medical History:  Diagnosis Date  . Abdominal pain, unspecified site   . Allergic rhinitis   . Anxiety   . Asthma   . Depression   . DVT (deep venous thrombosis) ([place]) 2016  . Fibroid uterus   . GERD (gastroesophageal reflux disease)   . Hypercholesterolemia   . Hypertension   . Internal hemorrhoid   . Irritable bowel syndrome   . Lymphocytic colitis 01/02/2015  . Pneumonia 1998  . Stroke ([place])   . Ulcerative colitis ([place])   . URI (upper respiratory infection)   . Uterine fibroid   . Vitamin B12 deficiency     Past Surgical History:  Procedure Laterality Date  . CESAREAN SECTION  2004  . CHOLECYSTECTOMY     laparoscopic "gallstones"  . ESOPHAGOGASTRODUODENOSCOPY (EGD) WITH PROPOFOL N/A 05/03/2014   Procedure: ESOPHAGOGASTRODUODENOSCOPY (EGD) WITH PROPOFOL;  Surgeon: Inda Castle, MD;  Location: [facility];  Service: Endoscopy;  Laterality: N/A;  . MYOMECTOMY    . UTERINE FIBROID SURGERY      There were no vitals filed for this visit.   TREATMENT: Oct[employer] fitness x 5 mins L 4  Therapeutic exercise:  Bilat Leg press 120 lbs x 1x20, seat hole 7 single leg press 1x15 RLE 75lbs (seat hole 7); 1x10 @105 ; LLE1x10 on 47.5 lbs  NMR: Standing on 1/2 foam,  horizontal head turns, vertical head turns 2x30sec ea, CGA, pt challenged by uneven surface  TM : Walking 1.0 miles/ hour x 10 mins   Pt educated throughout session about proper posture and technique with exercises. Improved exercise technique, movement at target joints, use of target muscles after min to mod verbal, visual, tactile cues.                       PT Education - 08/07/19 1006    Education Details  HEP    Person(s) Educated  Patient    Methods  Explanation;Demonstration;Tactile cues;Verbal cues    Comprehension  Verbal cues required;Tactile cues required;Need further instruction       PT Short Term Goals - 06/27/19 1411      PT SHORT TERM GOAL #1   Title  Patient will be independent in home exercise program to improve strength/mobility for better functional independence with ADLs.    Time  6    Period  Weeks    Status  New    Target Date  08/08/19      PT SHORT TERM GOAL #2   Title  Patient (< 20 years old) will complete five times sit to stand test in < 10 seconds indicating an increased LE strength  and improved balance.    Time  6    Period  Weeks    Status  New    Target Date  08/08/19        PT Long Term Goals - 06/27/19 1412      PT LONG TERM GOAL #1   Title  Patient will increase Berg Balance score by > 6 points to demonstrate decreased fall risk during functional activities.    Time  12    Period  Weeks    Status  New    Target Date  09/19/19      PT LONG TERM GOAL #2   Title  Patient will increase six minute walk test distance to >1200 for progression to community ambulator and improve gait ability    Time  12    Period  Weeks    Status  New    Target Date  09/19/19      PT LONG TERM GOAL #3   Title  Patient will increase 10 meter walk test to >1.78ms as to improve gait speed for better community ambulation and to reduce fall risk    Time  12    Period  Weeks    Status  New    Target Date  09/19/19      PT LONG TERM  GOAL #4   Title  Patient will reduce timed up and go to <11 seconds to reduce fall risk and demonstrate improved transfer/gait ability.    Time  12    Period  Weeks    Status  New    Target Date  09/19/19            Plan - 08/07/19 1007    Clinical Impression Statement  Patient demonstrates decreased gait speed occasional foot shuffle and requires verbal and tactile cueing to maintain speed during ambulation.  Patient also requires CGA during all dynamic standing balance activities. Patient requires consistent cueing to maintain correct position during gait activities. Patient demonstrates difficulty with gait and increased postural sway while on TM without UE support.  Patient will continue to benefit from skilled therapy in order to improve dynamic standing balance and increase endurance   Stability/Clinical Decision Making  Stable/Uncomplicated    Rehab Potential  Good    PT Frequency  2x / week    PT Duration  12 weeks    PT Treatment/Interventions  Gait training;Stair training;Neuromuscular re-education;Balance training;Therapeutic exercise;Manual techniques;Dry needling;Passive range of motion    PT Next Visit Plan  continue to progress program    PT Home Exercise Plan  HEP updated this session, HEP code: [id number]    Consulted and Agree with Plan of Care  Patient       Patient will benefit from skilled therapeutic intervention in order to improve the following deficits and impairments:  Abnormal gait, Decreased strength, Decreased activity tolerance  Visit Diagnosis: Other lack of coordination  Muscle weakness (generalized)  Hemiplegia and hemiparesis following cerebral infarction affecting left non-dominant side (HCC)  Small vessel cerebrovascular accident (CVA) ([facility]     Problem List Patient Active Problem List   Diagnosis Date Noted  . Hemiparesis affecting nondominant side as late effect of stroke ([place] 07/24/2019  . Muscle weakness (generalized) 06/11/2019  .  Hemiplegia and hemiparesis following cerebral infarction affecting left non-dominant side ([place] 06/11/2019  . Major depressive disorder   . Hypokalemia   . Acute blood loss anemia   . Ulcerative colitis with complication ([place]   . H/O: CVA (  cerebrovascular accident) 05/23/2019  . Thrombocytosis ([place]) 05/23/2019  . Coronary artery disease involving native coronary artery of native heart without angina pectoris 03/21/2018  . Coronary artery calcification 02/10/2018  . Obesity (BMI 30-39.9) 02/10/2018  . Former smoker 01/15/2018  . Dyspnea on exertion 12/21/2017  . Carpal tunnel syndrome 12/21/2017  . Stress incontinence 12/21/2017  . Paresthesia of both feet 07/04/2017  . Always thirsty 07/04/2017  . Abdominal bloating 12/01/2015  . Fatigue 12/01/2015  . Vitamin B12 deficiency 12/01/2015  . Lymphocytic colitis 01/16/2015  . Anemia, iron deficiency 01/16/2015  . Anxiety state 12/20/2014  . Diarrhea 11/27/2014  . Insomnia 11/27/2014  . History of DVT (deep vein thrombosis) 09/17/2014  . OSA (obstructive sleep apnea) 09/02/2014  . Gastritis, chronic 06/06/2014  . Unspecified gastritis and gastroduodenitis without mention of hemorrhage 05/03/2014  . Abdominal pain, epigastric 04/19/2014  . Internal hemorrhoids with other complication 94/17/4081  . Routine general medical examination at a health care facility 10/14/2011  . Thyroid nodule 05/16/2011  . Rectal bleeding 11/24/2010  . Hemorrhoids, internal 11/24/2010  . Irritable bowel syndrome (IBS) 11/24/2010  . ANEMIA 10/02/2009  . Hyperlipidemia 07/18/2009  . Essential hypertension 07/18/2009  . DYSPNEA ON EXERTION 07/18/2009  . THYROID CYST 01/31/2008  . ANXIETY 01/31/2008  . Depression with anxiety 01/31/2008  . GASTRIC ULCER, HX OF 01/31/2008  . FIBROIDS, UTERUS 11/13/2007  . ALLERGIC RHINITIS 11/13/2007  . ASTHMA 11/13/2007  . GERD 11/13/2007  . IRRITABLE BOWEL SYNDROME 11/13/2007  . INTERNAL HEMORRHOIDS 11/12/2003     Alanson Puls, PT DPT 08/07/2019, 10:08 AM  [place] MAIN [facility] SERVICES [address] [place], [place], [address] Phone: [phone]   Fax:  [phone]  Name: Carol Johnson MRN: [id number] Date of Birth: [birth year]

## 2019-08-07 NOTE — Therapy (Signed)
[place] MAIN [facility] SERVICES [address] [place], [place], [address] Phone: [phone]   Fax:  [phone]  Occupational Therapy Treatment  Patient Details  Name: Carol Johnson MRN: [id number] Date of Birth: 1966-03-14 Referring Provider (OT): Dr [facility]   Encounter Date: 08/07/2019  OT End of Session - 08/07/19 1003    Visit Number  12    Number of Visits  24    Date for OT Re-Evaluation  08/16/19    Authorization Type  BCBS    OT Start Time  0932    OT Stop Time  1015    OT Time Calculation (min)  43 min    Activity Tolerance  Patient tolerated treatment well    Behavior During Therapy  [facility] for tasks assessed/performed       Past Medical History:  Diagnosis Date  . Abdominal pain, unspecified site   . Allergic rhinitis   . Anxiety   . Asthma   . Depression   . DVT (deep venous thrombosis) ([place]) 2016  . Fibroid uterus   . GERD (gastroesophageal reflux disease)   . Hypercholesterolemia   . Hypertension   . Internal hemorrhoid   . Irritable bowel syndrome   . Lymphocytic colitis 01/02/2015  . Pneumonia 1998  . Stroke ([place])   . Ulcerative colitis ([place])   . URI (upper respiratory infection)   . Uterine fibroid   . Vitamin B12 deficiency     Past Surgical History:  Procedure Laterality Date  . CESAREAN SECTION  2004  . CHOLECYSTECTOMY     laparoscopic "gallstones"  . ESOPHAGOGASTRODUODENOSCOPY (EGD) WITH PROPOFOL N/A 05/03/2014   Procedure: ESOPHAGOGASTRODUODENOSCOPY (EGD) WITH PROPOFOL;  Surgeon: Inda Castle, MD;  Location: [facility];  Service: Endoscopy;  Laterality: N/A;  . MYOMECTOMY    . UTERINE FIBROID SURGERY      There were no vitals filed for this visit.  Subjective Assessment - 08/07/19 1001    Subjective   Patient reports she is still having some difficulty with her left arm noticing when driving.  Also with reaching back to put her seat belt on.    Pertinent History  Pt recently  discharged from CIR at [facility] after having a CVA in R corona radiata and basal ganglia and was in CIR from 05/25/19-06/06/19.    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    Currently in Pain?  Yes    Pain Score  6     Pain Location  Shoulder    Pain Orientation  Left    Pain Descriptors / Indicators  Aching    Pain Type  Acute pain    Multiple Pain Sites  No       Therapeutic Exercises: Patient seen for IR/ER exercises with and without red theraband.  Therapist demo and cues for technique. Patient seen for UB strengthening with UBE for 6 mins forwards, backwards, alternating directions and levels of resistance from 3.8 to 4.2 , therapist in constant attendance to ensure grip and adjust and direct settings. Patient seen for Wall stretches for ER, some pain noted. 1.5# dowel exercises for UE strengthening with shoulder flexion, ABD, ADD, chest press, forwards backwards circles and diagonal patterns Some slight pain with scaption  Patient seen for translatory skills of the hand with use of small stick to move fingers up and down with occasional cues for isolated finger movements.  Response to tx:  Patient continues to demonstrate weakness and decreased coordination in left UE, limitations in ER especially when attempting to put on her seatbelt.  Some pain with ER this date, encouraged stretches in pain free range and strengthening in available range.  Patient has returned to work with remotely and most of her work is on the computer and she has increased time to complete tasks.  Continue to work towards goals in plan of care to increase independence in necessary daily tasks.                     OT Education - 08/07/19 1002    Education Details  exercises for ER/IR rotation    Person(s) Educated  Patient    Methods  Explanation;Demonstration    Comprehension  Verbalized understanding;Returned demonstration;Need  further instruction       OT Short Term Goals - 07/15/19 1645      OT SHORT TERM GOAL #1   Title  Pt will be able to use L hand with R for zippers and buttons for upper body dressing independently    Time  12    Period  Weeks    Status  Achieved    Target Date  08/16/19      OT SHORT TERM GOAL #2   Title  Pt will increase strength in L hand to 34# for use during ADLs.    Time  12    Period  Weeks    Status  Achieved    Target Date  08/16/19      OT SHORT TERM GOAL #3   Title  Pt will increase fine motor skills in L hand to complete 9 hole peg test in 30 seconds.    Baseline  45 seconds is needed for 9 hold peg test.    Time  12    Period  Weeks    Status  On-going    Target Date  08/16/19      OT SHORT TERM GOAL #4   Title  Pt will complete typing test using L hand with R on laptop with no more than 5 errors.    Baseline  pt is unable to use L hand to type    Time  12    Period  Weeks    Status  On-going    Target Date  08/16/19      OT SHORT TERM GOAL #5   Title  Pt will incorporate energy conservation tech during ADLs and IADLs.    Baseline  Pt is currently not using.    Time  12    Period  Weeks    Status  Achieved    Target Date  08/16/19      OT SHORT TERM GOAL #6   Title  Pt will be educated in HEP for LUE strength and coordination skills training.    Baseline  pt does not have one currently but was given theraputty exercises with written exercises today.    Time  12    Period  Weeks    Status  On-going    Target Date  08/16/19               Plan - 08/07/19 1004    Clinical Impression Statement  Patient continues to demonstrate weakness and decreased coordination in left UE, limitations in ER especially when attempting to put on her seatbelt.  Some pain with ER this date, encouraged stretches in pain free  range and strengthening in available range.  Patient has returned to work with remotely and most of her work is on the computer and she has  increased time to complete tasks.  Continue to work towards goals in plan of care to increase independence in necessary daily tasks.    OT Occupational Profile and History  Problem Focused Assessment - Including review of records relating to presenting problem    Occupational performance deficits (Please refer to evaluation for details):  ADL's;IADL's;Work;Leisure    Body Structure / Function / Physical Skills  ADL;Dexterity;Balance;Coordination;FMC;Tone;UE functional use;Endurance;IADL    Psychosocial Skills  Optometrist and Behaviors    Rehab Potential  Excellent    Clinical Decision Making  Several treatment options, min-mod task modification necessary    Comorbidities Affecting Occupational Performance:  May have comorbidities impacting occupational performance    Modification or Assistance to Complete Evaluation   Min-Moderate modification of tasks or assist with assess necessary to complete eval    OT Frequency  2x / week    OT Duration  12 weeks    OT Treatment/Interventions  Self-care/ADL training;Therapeutic exercise;Balance training;Psychosocial skills training;Coping strategies training;Therapeutic activities;Neuromuscular education;DME and/or AE instruction;Patient/family education    Consulted and Agree with Plan of Care  Patient       Patient will benefit from skilled therapeutic intervention in order to improve the following deficits and impairments:   Body Structure / Function / Physical Skills: ADL, Dexterity, Balance, Coordination, FMC, Tone, UE functional use, Endurance, IADL   Psychosocial Skills: Coping Strategies, Routines and Behaviors   Visit Diagnosis: Muscle weakness (generalized)  Other lack of coordination  Hemiplegia and hemiparesis following cerebral infarction affecting left non-dominant side [facility])    Problem List Patient Active Problem List   Diagnosis Date Noted  . Hemiparesis affecting nondominant side as late effect of stroke ([place])  07/24/2019  . Muscle weakness (generalized) 06/11/2019  . Hemiplegia and hemiparesis following cerebral infarction affecting left non-dominant side ([place]) 06/11/2019  . Major depressive disorder   . Hypokalemia   . Acute blood loss anemia   . Ulcerative colitis with complication ([place])   . H/O: CVA (cerebrovascular accident) 05/23/2019  . Thrombocytosis ([place]) 05/23/2019  . Coronary artery disease involving native coronary artery of native heart without angina pectoris 03/21/2018  . Coronary artery calcification 02/10/2018  . Obesity (BMI 30-39.9) 02/10/2018  . Former smoker 01/15/2018  . Dyspnea on exertion 12/21/2017  . Carpal tunnel syndrome 12/21/2017  . Stress incontinence 12/21/2017  . Paresthesia of both feet 07/04/2017  . Always thirsty 07/04/2017  . Abdominal bloating 12/01/2015  . Fatigue 12/01/2015  . Vitamin B12 deficiency 12/01/2015  . Lymphocytic colitis 01/16/2015  . Anemia, iron deficiency 01/16/2015  . Anxiety state 12/20/2014  . Diarrhea 11/27/2014  . Insomnia 11/27/2014  . History of DVT (deep vein thrombosis) 09/17/2014  . OSA (obstructive sleep apnea) 09/02/2014  . Gastritis, chronic 06/06/2014  . Unspecified gastritis and gastroduodenitis without mention of hemorrhage 05/03/2014  . Abdominal pain, epigastric 04/19/2014  . Internal hemorrhoids with other complication 97/09/6376  . Routine general medical examination at a health care facility 10/14/2011  . Thyroid nodule 05/16/2011  . Rectal bleeding 11/24/2010  . Hemorrhoids, internal 11/24/2010  . Irritable bowel syndrome (IBS) 11/24/2010  . ANEMIA 10/02/2009  . Hyperlipidemia 07/18/2009  . Essential hypertension 07/18/2009  . DYSPNEA ON EXERTION 07/18/2009  . THYROID CYST 01/31/2008  . ANXIETY 01/31/2008  . Depression with anxiety 01/31/2008  . GASTRIC ULCER, HX OF 01/31/2008  . FIBROIDS,  UTERUS 11/13/2007  . ALLERGIC RHINITIS 11/13/2007  . ASTHMA 11/13/2007  . GERD 11/13/2007  . IRRITABLE BOWEL  SYNDROME 11/13/2007  . INTERNAL HEMORRHOIDS 11/12/2003   Hyun Reali T Tomasita Morrow, OTR/L, CLT  Niaomi Cartaya 08/07/2019, 11:35 AM  [place] MAIN [facility] SERVICES [place], [place], [address] Phone: [phone]   Fax:  [phone]  Name: MARLYS STEGMAIER MRN: [id number] Date of Birth: 16-Dec-1965

## 2019-08-10 ENCOUNTER — Encounter: Payer: Self-pay | Admitting: Occupational Therapy

## 2019-08-10 ENCOUNTER — Encounter: Payer: Self-pay | Admitting: Gastroenterology

## 2019-08-10 ENCOUNTER — Ambulatory Visit: Payer: BC Managed Care – PPO | Admitting: Occupational Therapy

## 2019-08-10 ENCOUNTER — Ambulatory Visit: Payer: BC Managed Care – PPO

## 2019-08-10 ENCOUNTER — Other Ambulatory Visit: Payer: Self-pay

## 2019-08-10 DIAGNOSIS — M6281 Muscle weakness (generalized): Secondary | ICD-10-CM | POA: Diagnosis not present

## 2019-08-10 DIAGNOSIS — I69354 Hemiplegia and hemiparesis following cerebral infarction affecting left non-dominant side: Secondary | ICD-10-CM

## 2019-08-10 DIAGNOSIS — I639 Cerebral infarction, unspecified: Secondary | ICD-10-CM | POA: Diagnosis not present

## 2019-08-10 DIAGNOSIS — R278 Other lack of coordination: Secondary | ICD-10-CM | POA: Diagnosis not present

## 2019-08-10 DIAGNOSIS — R05 Cough: Secondary | ICD-10-CM | POA: Diagnosis not present

## 2019-08-10 NOTE — Therapy (Signed)
[place] MAIN [facility] SERVICES [address] [place], [place], [address] Phone: [phone]   Fax:  [phone]  Occupational Therapy Treatment  Patient Details  Name: Carol Johnson MRN: [id number] Date of Birth: 1966/07/19 Referring Provider (OT): Dr [facility]   Encounter Date: 08/10/2019  OT End of Session - 08/15/19 0928    Visit Number  13    Number of Visits  24    Date for OT Re-Evaluation  08/16/19    Authorization Type  BCBS    OT Start Time  3845    OT Stop Time  1130    OT Time Calculation (min)  46 min    Activity Tolerance  Patient tolerated treatment well    Behavior During Therapy  [facility] for tasks assessed/performed       Past Medical History:  Diagnosis Date  . Abdominal pain, unspecified site   . Allergic rhinitis   . Anxiety   . Asthma   . Depression   . DVT (deep venous thrombosis) ([place]) 2016  . Fibroid uterus   . GERD (gastroesophageal reflux disease)   . Hypercholesterolemia   . Hypertension   . Internal hemorrhoid   . Irritable bowel syndrome   . Lymphocytic colitis 01/02/2015  . Pneumonia 1998  . Stroke ([place])   . Ulcerative colitis ([place])   . URI (upper respiratory infection)   . Uterine fibroid   . Vitamin B12 deficiency     Past Surgical History:  Procedure Laterality Date  . CESAREAN SECTION  2004  . CHOLECYSTECTOMY     laparoscopic "gallstones"  . ESOPHAGOGASTRODUODENOSCOPY (EGD) WITH PROPOFOL N/A 05/03/2014   Procedure: ESOPHAGOGASTRODUODENOSCOPY (EGD) WITH PROPOFOL;  Surgeon: Inda Castle, MD;  Location: [facility];  Service: Endoscopy;  Laterality: N/A;  . MYOMECTOMY    . UTERINE FIBROID SURGERY      There were no vitals filed for this visit.  Subjective Assessment - 08/15/19 0927    Subjective   Patient reports she was working today and could not finish up what she was doing in time to make it for physical therapy.    Pertinent History  Pt recently discharged from CIR at  [facility] after having a CVA in R corona radiata and basal ganglia and was in CIR from 05/25/19-06/06/19.    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    Currently in Pain?  Yes    Pain Score  5     Pain Location  Shoulder    Pain Orientation  Left    Pain Descriptors / Indicators  Aching    Pain Onset  More than a month ago    Pain Frequency  Intermittent       Patient with shoulder pain this date, patient seen for exercises on the mat table in sidelying for mobilization of left scapula for elevation, retraction and upwards rotation followed by active ROM of these same motions with therapist guiding through range.  In supine, patient seen for shoulder stabilization exercises for place and hold left shoulder at 90 degrees of shoulder flexion, added external perturbations.  ABC exercise for shoulder stability for 1 complete set.  Patient performing shoulder ROM for flexion, ABD, ADD, ER, triceps press.  Patient performing dowel exercises 1.5# for shoulder flexion, ABD/ADD, chest press, forwards and backwards circles, diagonal  patterns.  Reaching tasks in supine and in sitting.    Neuro:  Patient seen for unknotting exercise with use of bilateral hands but cues for use of left hand for the lead of the task.  Patient had difficulty with performing tighter knots and therapist had to loosen many of them.    Response to tx:   Patient with increased pain in shoulder this date at the beginning of session but decreased to 2/5 by end of session.  Encouraged patient to work on exercises for shoulder stability in supine at home over the next few days and report back.  Patient continues to demonstrate difficulty with fine motor coordination and hand strength as evidenced with task of unknotting medium nylon rope this date.  Continue to work towards goals in plan of care to increase independence  in daily tasks at home, work and in the community.                      OT Education - 08/15/19 [phone]    Education Details  exercises in supine for shoulder stability    Person(s) Educated  Patient    Methods  Explanation;Demonstration    Comprehension  Verbalized understanding;Returned demonstration;Need further instruction       OT Short Term Goals - 07/15/19 1645      OT SHORT TERM GOAL #1   Title  Pt will be able to use L hand with R for zippers and buttons for upper body dressing independently    Time  12    Period  Weeks    Status  Achieved    Target Date  08/16/19      OT SHORT TERM GOAL #2   Title  Pt will increase strength in L hand to 34# for use during ADLs.    Time  12    Period  Weeks    Status  Achieved    Target Date  08/16/19      OT SHORT TERM GOAL #3   Title  Pt will increase fine motor skills in L hand to complete 9 hole peg test in 30 seconds.    Baseline  45 seconds is needed for 9 hold peg test.    Time  12    Period  Weeks    Status  On-going    Target Date  08/16/19      OT SHORT TERM GOAL #4   Title  Pt will complete typing test using L hand with R on laptop with no more than 5 errors.    Baseline  pt is unable to use L hand to type    Time  12    Period  Weeks    Status  On-going    Target Date  08/16/19      OT SHORT TERM GOAL #5   Title  Pt will incorporate energy conservation tech during ADLs and IADLs.    Baseline  Pt is currently not using.    Time  12    Period  Weeks    Status  Achieved    Target Date  08/16/19      OT SHORT TERM GOAL #6   Title  Pt will be educated in HEP for LUE strength and coordination skills training.    Baseline  pt does not have one currently but was given theraputty exercises with written exercises today.    Time  12    Period  Weeks    Status  On-going    Target Date  08/16/19               Plan - 08/15/19 6010    Clinical Impression Statement  Patient with increased pain  in shoulder this date at the beginning of session but decreased to 2/5 by end of session.  Encouraged patient to work on exercises for shoulder stability in supine at home over the next few days and report back.  Patient continues to demonstrate difficulty with fine motor coordination and hand strength as evidenced with task of unknotting medium nylon rope this date.  Continue to work towards goals in plan of care to increase independence in daily tasks at home, work and in the community.    OT Occupational Profile and History  Problem Focused Assessment - Including review of records relating to presenting problem    Occupational performance deficits (Please refer to evaluation for details):  ADL's;IADL's;Work;Leisure    Body Structure / Function / Physical Skills  ADL;Dexterity;Balance;Coordination;FMC;Tone;UE functional use;Endurance;IADL    Psychosocial Skills  Optometrist and Behaviors    Rehab Potential  Excellent    Clinical Decision Making  Several treatment options, min-mod task modification necessary    Comorbidities Affecting Occupational Performance:  May have comorbidities impacting occupational performance    Modification or Assistance to Complete Evaluation   Min-Moderate modification of tasks or assist with assess necessary to complete eval    OT Frequency  2x / week    OT Duration  12 weeks    OT Treatment/Interventions  Self-care/ADL training;Therapeutic exercise;Balance training;Psychosocial skills training;Coping strategies training;Therapeutic activities;Neuromuscular education;DME and/or AE instruction;Patient/family education    Consulted and Agree with Plan of Care  Patient       Patient will benefit from skilled therapeutic intervention in order to improve the following deficits and impairments:   Body Structure / Function / Physical Skills: ADL, Dexterity, Balance, Coordination, FMC, Tone, UE functional use, Endurance, IADL   Psychosocial Skills: Coping  Strategies, Routines and Behaviors   Visit Diagnosis: Muscle weakness (generalized)  Hemiplegia and hemiparesis following cerebral infarction affecting left non-dominant side (HCC)  Other lack of coordination    Problem List Patient Active Problem List   Diagnosis Date Noted  . Hemiparesis affecting nondominant side as late effect of stroke ([place]) 07/24/2019  . Muscle weakness (generalized) 06/11/2019  . Hemiplegia and hemiparesis following cerebral infarction affecting left non-dominant side ([place]) 06/11/2019  . Major depressive disorder   . Hypokalemia   . Acute blood loss anemia   . Ulcerative colitis with complication ([place])   . H/O: CVA (cerebrovascular accident) 05/23/2019  . Thrombocytosis ([place]) 05/23/2019  . Coronary artery disease involving native coronary artery of native heart without angina pectoris 03/21/2018  . Coronary artery calcification 02/10/2018  . Obesity (BMI 30-39.9) 02/10/2018  . Former smoker 01/15/2018  . Dyspnea on exertion 12/21/2017  . Carpal tunnel syndrome 12/21/2017  . Stress incontinence 12/21/2017  . Paresthesia of both feet 07/04/2017  . Always thirsty 07/04/2017  . Abdominal bloating 12/01/2015  . Fatigue 12/01/2015  . Vitamin B12 deficiency 12/01/2015  . Lymphocytic colitis 01/16/2015  . Anemia, iron deficiency 01/16/2015  . Anxiety state 12/20/2014  . Diarrhea 11/27/2014  . Insomnia 11/27/2014  . History of DVT (deep vein thrombosis) 09/17/2014  . OSA (obstructive sleep apnea) 09/02/2014  . Gastritis, chronic 06/06/2014  . Unspecified gastritis and gastroduodenitis without mention of hemorrhage 05/03/2014  . Abdominal pain, epigastric 04/19/2014  . Internal hemorrhoids with other complication 93/23/5573  . Routine  general medical examination at a health care facility 10/14/2011  . Thyroid nodule 05/16/2011  . Rectal bleeding 11/24/2010  . Hemorrhoids, internal 11/24/2010  . Irritable bowel syndrome (IBS) 11/24/2010  . ANEMIA  10/02/2009  . Hyperlipidemia 07/18/2009  . Essential hypertension 07/18/2009  . DYSPNEA ON EXERTION 07/18/2009  . THYROID CYST 01/31/2008  . ANXIETY 01/31/2008  . Depression with anxiety 01/31/2008  . GASTRIC ULCER, HX OF 01/31/2008  . FIBROIDS, UTERUS 11/13/2007  . ALLERGIC RHINITIS 11/13/2007  . ASTHMA 11/13/2007  . GERD 11/13/2007  . IRRITABLE BOWEL SYNDROME 11/13/2007  . INTERNAL HEMORRHOIDS 11/12/2003   Carol Johnson, OTR/L, CLT  Carol Johnson 08/15/2019, 9:37 AM  [place] MAIN [facility] SERVICES [address] [place], [place], [address] Phone: [phone]   Fax:  [phone]  Name: Carol Johnson MRN: [id number] Date of Birth: [birth year]

## 2019-08-14 ENCOUNTER — Ambulatory Visit: Payer: BC Managed Care – PPO | Admitting: Physical Therapy

## 2019-08-14 ENCOUNTER — Ambulatory Visit: Payer: BC Managed Care – PPO | Admitting: Occupational Therapy

## 2019-08-17 ENCOUNTER — Ambulatory Visit: Payer: BC Managed Care – PPO | Admitting: Occupational Therapy

## 2019-08-17 ENCOUNTER — Ambulatory Visit: Payer: BC Managed Care – PPO

## 2019-08-21 ENCOUNTER — Ambulatory Visit: Payer: BC Managed Care – PPO | Admitting: Occupational Therapy

## 2019-08-21 ENCOUNTER — Encounter: Payer: Self-pay | Admitting: Family Medicine

## 2019-08-21 ENCOUNTER — Ambulatory Visit: Payer: BC Managed Care – PPO | Admitting: Physical Therapy

## 2019-08-23 ENCOUNTER — Encounter: Payer: BC Managed Care – PPO | Admitting: Occupational Therapy

## 2019-08-23 ENCOUNTER — Ambulatory Visit: Payer: BC Managed Care – PPO

## 2019-08-27 ENCOUNTER — Telehealth: Payer: BC Managed Care – PPO | Admitting: Family

## 2019-08-27 DIAGNOSIS — J019 Acute sinusitis, unspecified: Secondary | ICD-10-CM | POA: Diagnosis not present

## 2019-08-27 DIAGNOSIS — B9689 Other specified bacterial agents as the cause of diseases classified elsewhere: Secondary | ICD-10-CM

## 2019-08-27 MED ORDER — AMOXICILLIN-POT CLAVULANATE 875-125 MG PO TABS: 1.0000 | ORAL_TABLET | Freq: Two times a day (BID) | ORAL | 0 refills | Status: DC

## 2019-08-27 NOTE — Progress Notes (Signed)
We are sorry that you are not feeling well.  Here is how we plan to help!  Based on what you have shared with me it looks like you have sinusitis.  Sinusitis is inflammation and infection in the sinus cavities of the head.  Based on your presentation I believe you most likely have Acute Bacterial Sinusitis.  This is an infection caused by bacteria and is treated with antibiotics. I have prescribed Augmentin 885m/125mg one tablet twice daily with food, for 7 days. You may use an oral decongestant such as Mucinex D or if you have glaucoma or high blood pressure use plain Mucinex. Saline nasal spray help and can safely be used as often as needed for congestion.  If you develop worsening sinus pain, fever or notice severe headache and vision changes, or if symptoms are not better after completion of antibiotic, please schedule an appointment with a health care provider.    Sinus infections are not as easily transmitted as other respiratory infection, however we still recommend that you avoid close contact with loved ones, especially the very young and elderly.  Remember to wash your hands thoroughly throughout the day as this is the number one way to prevent the spread of infection!  Home Care:  Only take medications as instructed by your medical team.  Complete the entire course of an antibiotic.  Do not take these medications with alcohol.  A steam or ultrasonic humidifier can help congestion.  You can place a towel over your head and breathe in the steam from hot water coming from a faucet.  Avoid close contacts especially the very young and the elderly.  Cover your mouth when you cough or sneeze.  Always remember to wash your hands.  Get Help Right Away If:  You develop worsening fever or sinus pain.  You develop a severe head ache or visual changes.  Your symptoms persist after you have completed your treatment plan.  Make sure you  Understand these instructions.  Will watch your  condition.  Will get help right away if you are not doing well or get worse.  Your e-visit answers were reviewed by a board certified advanced clinical practitioner to complete your personal care plan.  Depending on the condition, your plan could have included both over the counter or prescription medications.  If there is a problem please reply  once you have received a response from your provider.  Your safety is important to [place]  If you have drug allergies check your prescription carefully.    You can use MyChart to ask questions about today's visit, request a non-urgent call back, or ask for a work or school excuse for 24 hours related to this e-Visit. If it has been greater than 24 hours you will need to follow up with your provider, or enter a new e-Visit to address those concerns.  You will get an e-mail in the next two days asking about your experience.  I hope that your e-visit has been valuable and will speed your recovery. Thank you for using e-visits.    Greater than 5 minutes, yet less than 10 minutes of time have been spent researching, coordinating, and implementing care for this patient today.  Thank you for the details you included in the comment boxes. Those details are very helpful in determining the best course of treatment for you and help [place] provide the best care.

## 2019-08-28 ENCOUNTER — Ambulatory Visit: Payer: BC Managed Care – PPO | Admitting: Occupational Therapy

## 2019-08-28 ENCOUNTER — Ambulatory Visit: Payer: BC Managed Care – PPO | Admitting: Physical Therapy

## 2019-08-28 ENCOUNTER — Other Ambulatory Visit: Payer: Self-pay

## 2019-08-28 DIAGNOSIS — R05 Cough: Secondary | ICD-10-CM

## 2019-08-28 DIAGNOSIS — R278 Other lack of coordination: Secondary | ICD-10-CM | POA: Diagnosis not present

## 2019-08-28 DIAGNOSIS — I69354 Hemiplegia and hemiparesis following cerebral infarction affecting left non-dominant side: Secondary | ICD-10-CM | POA: Diagnosis not present

## 2019-08-28 DIAGNOSIS — M6281 Muscle weakness (generalized): Secondary | ICD-10-CM

## 2019-08-28 DIAGNOSIS — R058 Other specified cough: Secondary | ICD-10-CM

## 2019-08-28 DIAGNOSIS — I639 Cerebral infarction, unspecified: Secondary | ICD-10-CM | POA: Diagnosis not present

## 2019-08-28 NOTE — Therapy (Signed)
[place] MAIN [facility] SERVICES [address] [place], [place], [address] Phone: [phone]   Fax:  [phone]  Physical Therapy Treatment Physical Therapy Progress Note Dates of reporting period  06/27/19   to   08/28/19  Patient Details  Name: Carol Johnson MRN: [id number] Date of Birth: 20-Nov-1965 Referring Provider (PT): Lauraine Rinne   Encounter Date: 08/28/2019  PT End of Session - 08/28/19 1030    Visit Number  10    Number of Visits  25    Date for PT Re-Evaluation  09/19/19    Equipment Utilized During Treatment  Gait belt    Activity Tolerance  Patient tolerated treatment well    Behavior During Therapy  [facility] for tasks assessed/performed       Past Medical History:  Diagnosis Date  . Abdominal pain, unspecified site   . Allergic rhinitis   . Anxiety   . Asthma   . Depression   . DVT (deep venous thrombosis) ([place]) 2016  . Fibroid uterus   . GERD (gastroesophageal reflux disease)   . Hypercholesterolemia   . Hypertension   . Internal hemorrhoid   . Irritable bowel syndrome   . Lymphocytic colitis 01/02/2015  . Pneumonia 1998  . Stroke ([place])   . Ulcerative colitis ([place])   . URI (upper respiratory infection)   . Uterine fibroid   . Vitamin B12 deficiency     Past Surgical History:  Procedure Laterality Date  . CESAREAN SECTION  2004  . CHOLECYSTECTOMY     laparoscopic "gallstones"  . ESOPHAGOGASTRODUODENOSCOPY (EGD) WITH PROPOFOL N/A 05/03/2014   Procedure: ESOPHAGOGASTRODUODENOSCOPY (EGD) WITH PROPOFOL;  Surgeon: Inda Castle, MD;  Location: [facility];  Service: Endoscopy;  Laterality: N/A;  . MYOMECTOMY    . UTERINE FIBROID SURGERY      There were no vitals filed for this visit.  Subjective Assessment - 08/28/19 1028    Subjective  Pt doing good today. she had a work meeting and missed her last visit .    Pertinent History  Pt recently discharged from CIR at [facility] after having a CVA in R corona  radiata and basal ganglia and was in CIR from 05/25/19-06/06/19.    Currently in Pain?  Yes    Pain Score  4     Pain Location  Hip    Pain Orientation  Right;Left    Pain Descriptors / Indicators  Aching    Pain Type  Chronic pain    Pain Onset  More than a month ago    Pain Frequency  Intermittent    Aggravating Factors   worse with weight bearing    Pain Relieving Factors  rest    Effect of Pain on Daily Activities  decreased activity tolerance    Multiple Pain Sites  No         [facility] PT Assessment - 08/28/19 0001      Balance   Balance Assessed  Yes      Standardized Balance Assessment   Standardized Balance Assessment  Berg Balance Test      Berg Balance Test   Sit to Stand  Able to stand without using hands and stabilize independently    Standing Unsupported  Able to stand safely 2 minutes    Sitting with Back Unsupported but Feet Supported on Floor or Stool  Able to sit safely and securely 2 minutes    Stand to Sit  Sits safely with minimal use of hands  Transfers  Able to transfer safely, definite need of hands    Standing Unsupported with Eyes Closed  Able to stand 10 seconds safely    Standing Unsupported with Feet Together  Able to place feet together independently and stand 1 minute safely    From Standing, Reach Forward with Outstretched Arm  Can reach forward >12 cm safely (5")    From Standing Position, Pick up Object from [place] to pick up shoe safely and easily    From Standing Position, Turn to Look Behind Over each Shoulder  Looks behind from both sides and weight shifts well    Turn 360 Degrees  Able to turn 360 degrees safely in 4 seconds or less    Standing Unsupported, Alternately Place Feet on Step/Stool  Able to stand independently and safely and complete 8 steps in 20 seconds    Standing Unsupported, One Foot in Front  Able to plae foot ahead of the other independently and hold 30 seconds    Standing on One Leg  Tries to lift leg/unable to hold 3  seconds but remains standing independently    Total Score  50          Therapeutic activities: Berg balance test performed 10 MW test TUG test 6 MW test Patient is making progress towards all goals.  Goals were reviewed with patient.                    PT Education - 08/28/19 1029    Education Details  HEP    Person(s) Educated  Patient    Methods  Explanation;Demonstration    Comprehension  Verbalized understanding;Returned demonstration;Verbal cues required;Tactile cues required;Need further instruction       PT Short Term Goals - 08/28/19 1115      PT SHORT TERM GOAL #1   Title  Patient will be independent in home exercise program to improve strength/mobility for better functional independence with ADLs.    Time  6    Period  Weeks    Status  Achieved    Target Date  08/08/19      PT SHORT TERM GOAL #2   Title  Patient (< 27 years old) will complete five times sit to stand test in < 10 seconds indicating an increased LE strength and improved balance.    Baseline  08/28/19=19.20 sec    Time  6    Period  Weeks    Status  Partially Met    Target Date  08/08/19        PT Long Term Goals - 08/28/19 1030      PT LONG TERM GOAL #1   Title  Patient will increase Berg Balance score by > 6 points to demonstrate decreased fall risk during functional activities.    Baseline  06/27/19 = 52/5612/29/20=50/56    Time  12    Period  Weeks    Status  On-going    Target Date  09/19/19      PT LONG TERM GOAL #2   Title  Patient will increase six minute walk test distance to >1200 for progression to community ambulator and improve gait ability    Baseline  06/27/19 deferred , 08/28/19= 675 ft    Time  12    Period  Weeks    Status  On-going    Target Date  09/19/19      PT LONG TERM GOAL #3   Title  Patient will increase 10  meter walk test to >1.7ms as to improve gait speed for better community ambulation and to reduce fall risk    Baseline  06/27/19=  .61 sec ,  08/28/19=.85 m/sec    Time  12    Period  Weeks    Status  On-going    Target Date  09/19/19      PT LONG TERM GOAL #4   Title  Patient will reduce timed up and go to <11 seconds to reduce fall risk and demonstrate improved transfer/gait ability.    Baseline  06/27/19= 22.80 sec ,  08/28/19=12.11 sec    Time  12    Period  Weeks    Status  Partially Met    Target Date  09/19/19            Plan - 08/28/19 1030    Clinical Impression Statement  Patient's condition has the potential to improve in response to therapy. Maximum improvement is yet to be obtained. The anticipated improvement is attainable and reasonable in a generally predictable time.  Patient reports that she is feeling better with her overall mobility. She made progress towards her goals and her balance is improving. She will continue to benefit from skilled PT to improve  mobility and decrease her falls risk.    Stability/Clinical Decision Making  Stable/Uncomplicated    Rehab Potential  Good    PT Frequency  2x / week    PT Duration  12 weeks    PT Treatment/Interventions  Gait training;Stair training;Neuromuscular re-education;Balance training;Therapeutic exercise;Manual techniques;Dry needling;Passive range of motion    PT Next Visit Plan  continue to progress program    PT Home Exercise Plan  HEP updated this session, HEP code: [id number]    Consulted and Agree with Plan of Care  Patient       Patient will benefit from skilled therapeutic intervention in order to improve the following deficits and impairments:  Abnormal gait, Decreased strength, Decreased activity tolerance  Visit Diagnosis: Hemiplegia and hemiparesis following cerebral infarction affecting left non-dominant side (HCC)  Muscle weakness (generalized)  Other lack of coordination  Small vessel cerebrovascular accident (CVA) ([place]  Respiratory tract congestion with cough     Problem List Patient Active Problem List   Diagnosis  Date Noted  . Hemiparesis affecting nondominant side as late effect of stroke ([place] 07/24/2019  . Muscle weakness (generalized) 06/11/2019  . Hemiplegia and hemiparesis following cerebral infarction affecting left non-dominant side ([place] 06/11/2019  . Major depressive disorder   . Hypokalemia   . Acute blood loss anemia   . Ulcerative colitis with complication ([place]   . H/O: CVA (cerebrovascular accident) 05/23/2019  . Thrombocytosis ([place] 05/23/2019  . Coronary artery disease involving native coronary artery of native heart without angina pectoris 03/21/2018  . Coronary artery calcification 02/10/2018  . Obesity (BMI 30-39.9) 02/10/2018  . Former smoker 01/15/2018  . Dyspnea on exertion 12/21/2017  . Carpal tunnel syndrome 12/21/2017  . Stress incontinence 12/21/2017  . Paresthesia of both feet 07/04/2017  . Always thirsty 07/04/2017  . Abdominal bloating 12/01/2015  . Fatigue 12/01/2015  . Vitamin B12 deficiency 12/01/2015  . Lymphocytic colitis 01/16/2015  . Anemia, iron deficiency 01/16/2015  . Anxiety state 12/20/2014  . Diarrhea 11/27/2014  . Insomnia 11/27/2014  . History of DVT (deep vein thrombosis) 09/17/2014  . OSA (obstructive sleep apnea) 09/02/2014  . Gastritis, chronic 06/06/2014  . Unspecified gastritis and gastroduodenitis without mention of hemorrhage 05/03/2014  . Abdominal pain, epigastric 04/19/2014  .  Internal hemorrhoids with other complication 56/38/9373  . Routine general medical examination at a health care facility 10/14/2011  . Thyroid nodule 05/16/2011  . Rectal bleeding 11/24/2010  . Hemorrhoids, internal 11/24/2010  . Irritable bowel syndrome (IBS) 11/24/2010  . ANEMIA 10/02/2009  . Hyperlipidemia 07/18/2009  . Essential hypertension 07/18/2009  . DYSPNEA ON EXERTION 07/18/2009  . THYROID CYST 01/31/2008  . ANXIETY 01/31/2008  . Depression with anxiety 01/31/2008  . GASTRIC ULCER, HX OF 01/31/2008  . FIBROIDS, UTERUS 11/13/2007  . ALLERGIC  RHINITIS 11/13/2007  . ASTHMA 11/13/2007  . GERD 11/13/2007  . IRRITABLE BOWEL SYNDROME 11/13/2007  . INTERNAL HEMORRHOIDS 11/12/2003    Alanson Puls, PT DPT 08/28/2019, 11:21 AM  [place] MAIN [facility] SERVICES [address] [place], [place], [address] Phone: [phone]   Fax:  [phone]  Name: Carol Johnson MRN: [id number] Date of Birth: 10-Oct-1965

## 2019-08-30 ENCOUNTER — Ambulatory Visit: Payer: BC Managed Care – PPO | Admitting: Occupational Therapy

## 2019-08-30 NOTE — Addendum Note (Signed)
Addended by: Garlon Hatchet T on: 08/30/2019 09:42 AM   Modules accepted: Orders

## 2019-08-30 NOTE — Therapy (Signed)
[place] MAIN [facility] SERVICES [address] [place], [place], [address] Phone: [phone]   Fax:  [phone]  Occupational Therapy Treatment  Patient Details  Name: Carol Johnson MRN: [id number] Date of Birth: 14-Aug-1966 Referring Provider (OT): Dr Loura Pardon at [facility]   Encounter Date: 08/28/2019  OT End of Session - 08/30/19 0851    Visit Number  14    Number of Visits  24    Date for OT Re-Evaluation  10/09/19    Authorization Type  BC[employer]    OT Start Time  1058    OT Stop Time  1145    OT Time Calculation (min)  47 min    Activity Tolerance  Patient tolerated treatment well    Behavior During Therapy  [facility] for tasks assessed/performed       Past Medical History:  Diagnosis Date  . Abdominal pain, unspecified site   . Allergic rhinitis   . Anxiety   . Asthma   . Depression   . DVT (deep venous thrombosis) ([place]) 2016  . Fibroid uterus   . GERD (gastroesophageal reflux disease)   . Hypercholesterolemia   . Hypertension   . Internal hemorrhoid   . Irritable bowel syndrome   . Lymphocytic colitis 01/02/2015  . Pneumonia 1998  . Stroke ([place])   . Ulcerative colitis ([place])   . URI (upper respiratory infection)   . Uterine fibroid   . Vitamin B12 deficiency     Past Surgical History:  Procedure Laterality Date  . CESAREAN SECTION  2004  . CHOLECYSTECTOMY     laparoscopic "gallstones"  . ESOPHAGOGASTRODUODENOSCOPY (EGD) WITH PROPOFOL N/A 05/03/2014   Procedure: ESOPHAGOGASTRODUODENOSCOPY (EGD) WITH PROPOFOL;  Surgeon: Inda Castle, MD;  Location: [facility];  Service: Endoscopy;  Laterality: N/A;  . MYOMECTOMY    . UTERINE FIBROID SURGERY      There were no vitals filed for this visit.  Subjective Assessment - 08/30/19 0849    Subjective   Patient reports she missed a few appts since having to work and the holidays.    Pertinent History  Pt recently discharged from CIR at [facility] after having a CVA in R corona  radiata and basal ganglia and was in CIR from 05/25/19-06/06/19.    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    Currently in Pain?  Yes    Pain Score  4     Pain Location  Shoulder    Pain Orientation  Left    Pain Descriptors / Indicators  Aching    Pain Type  Chronic pain    Pain Onset  More than a month ago    Pain Frequency  Intermittent    Multiple Pain Sites  No         [facility] OT Assessment - 08/30/19 0847      Coordination   9 Hole Peg Test  Right;Left    Right 9 Hole Peg Test  22    Left 9 Hole Peg Test  31      Hand Function   Right Hand Grip (lbs)  54    Right Hand Lateral Pinch  17 lbs    Right Hand 3 Point Pinch  15 lbs    Left Hand Grip (lbs)  40    Left Hand Lateral Pinch  13  lbs    Left 3 point pinch  14 lbs    Comment  2 point pinch R 11#, left 10#       ADLs/IADLs:  Patient reports her balance is still impaired but has gotten better over time, she can now lay on her left side for short periods of time but still has some shoulder pain intermittently on the left.  She can now demonstrate standing in the shower and completing bathing without assist or having to hold the bar.  She is able to navigate stairs to get to her bedroom on the 2nd floor.  She demonstrates greater control on the left with gross and fine motor coordination and reports improved control with driving.  She is independent with basic self care tasks.  She is able to complete homemaking tasks including cooking with modified independence.  She has returned to work (from home on the computer) and reports she is able to complete the duties of her job without difficulty.   Areas that are still problematic: Motor control in the left arm remains impaired but has improved She has difficulty with putting on shoes in standing balancing on one foot (discussed safety with sitting to complete this  task) Managing heavy pots and pans-they still feel really heavy on the left side. Her typing speed is not quite back to her baseline Coordination in left hand especially with small finger remains impaired (small finger hits the caps lock all the time)   Typing test completed this date 5 min test: 27 WPM with 97% accuracy with Adjusted WPM 26.  Patient continues to demo decreased grip and pinch strength, upgraded her resistive putty to blue and issued putty booklet with exercises, starred the exercises that she needs to work on most.   Issued and instructed on fine motor coordination activities for home program and patient able to demonstrate understanding.    Response to tx:   Patient would like to try to work on exercises from home and wants to hold in person therapy for a few weeks and work towards independent home program.  She demonstrates understanding of HEP, she has met most of her goals except with fine motor coordination skills.  She still has some areas of impairment as noted above and will continue to work towards these areas.  Patient to follow up with therapist as needed to make adjustments to home program as she continues to progress.                    OT Education - 08/30/19 0850    Education Details  HEP for [facility], putty exercises, handout    Person(s) Educated  Patient    Methods  Explanation;Demonstration    Comprehension  Verbalized understanding;Returned demonstration;Need further instruction       OT Short Term Goals - 08/30/19 9485      OT SHORT TERM GOAL #1   Title  Pt will be able to use L hand with R for zippers and buttons for upper body dressing independently    Time  12    Period  Weeks    Status  Achieved    Target Date  08/16/19      OT SHORT TERM GOAL #2   Title  Pt will increase strength in L hand to 34# for use during ADLs.    Time  12    Period  Weeks    Status  Achieved    Target Date  08/16/19  OT SHORT TERM GOAL #3   Title   Pt will increase fine motor skills in L hand to complete 9 hole peg test in 30 seconds.    Baseline  45 seconds is needed for 9 hold peg test.    Time  12    Period  Weeks    Status  On-going    Target Date  08/16/19      OT SHORT TERM GOAL #4   Title  Pt will complete typing test using L hand with R on laptop with no more than 5 errors.    Baseline  pt is unable to use L hand to type    Time  12    Period  Weeks    Status  Achieved    Target Date  08/16/19      OT SHORT TERM GOAL #5   Title  Pt will incorporate energy conservation tech during ADLs and IADLs.    Baseline  Pt is currently not using.    Time  12    Period  Weeks    Status  Achieved    Target Date  08/16/19      OT SHORT TERM GOAL #6   Title  Pt will be educated in HEP for LUE strength and coordination skills training.    Baseline  pt does not have one currently but was given theraputty exercises with written exercises today.    Time  12    Period  Weeks    Status  Achieved    Target Date  08/16/19               Plan - 08/30/19 2334    Clinical Impression Statement  Patient would like to try to work on exercises from home and wants to hold in person therapy for a few weeks and work towards independent home program.  She demonstrates understanding of HEP, she has met most of her goals except with fine motor coordination skills.  She still has some areas of impairment as noted above and will continue to work towards these areas.  Patient to follow up with therapist as needed to make adjustments to home program as she continues to progress.    OT Occupational Profile and History  Problem Focused Assessment - Including review of records relating to presenting problem    Occupational performance deficits (Please refer to evaluation for details):  ADL's;IADL's;Work;Leisure    Body Structure / Function / Physical Skills  ADL;Dexterity;Balance;Coordination;FMC;Tone;UE functional use;Endurance;IADL    Psychosocial  Skills  Optometrist and Behaviors    Rehab Potential  Excellent    Clinical Decision Making  Several treatment options, min-mod task modification necessary    Comorbidities Affecting Occupational Performance:  May have comorbidities impacting occupational performance    Modification or Assistance to Complete Evaluation   Min-Moderate modification of tasks or assist with assess necessary to complete eval    OT Frequency  1x / week    OT Duration  6 weeks    OT Treatment/Interventions  Self-care/ADL training;Therapeutic exercise;Balance training;Psychosocial skills training;Coping strategies training;Therapeutic activities;Neuromuscular education;DME and/or AE instruction;Patient/family education    Consulted and Agree with Plan of Care  Patient       Patient will benefit from skilled therapeutic intervention in order to improve the following deficits and impairments:   Body Structure / Function / Physical Skills: ADL, Dexterity, Balance, Coordination, FMC, Tone, UE functional use, Endurance, IADL   Psychosocial Skills: Coping Strategies, Routines and Behaviors  Visit Diagnosis: Muscle weakness (generalized) - Plan: Ot plan of care cert/re-cert  Other lack of coordination - Plan: Ot plan of care cert/re-cert  Hemiplegia and hemiparesis following cerebral infarction affecting left non-dominant side ([place]) - Plan: Ot plan of care cert/re-cert    Problem List Patient Active Problem List   Diagnosis Date Noted  . Hemiparesis affecting nondominant side as late effect of stroke ([place]) 07/24/2019  . Muscle weakness (generalized) 06/11/2019  . Hemiplegia and hemiparesis following cerebral infarction affecting left non-dominant side ([place]) 06/11/2019  . Major depressive disorder   . Hypokalemia   . Acute blood loss anemia   . Ulcerative colitis with complication ([place])   . H/O: CVA (cerebrovascular accident) 05/23/2019  . Thrombocytosis ([place]) 05/23/2019  . Coronary artery  disease involving native coronary artery of native heart without angina pectoris 03/21/2018  . Coronary artery calcification 02/10/2018  . Obesity (BMI 30-39.9) 02/10/2018  . Former smoker 01/15/2018  . Dyspnea on exertion 12/21/2017  . Carpal tunnel syndrome 12/21/2017  . Stress incontinence 12/21/2017  . Paresthesia of both feet 07/04/2017  . Always thirsty 07/04/2017  . Abdominal bloating 12/01/2015  . Fatigue 12/01/2015  . Vitamin B12 deficiency 12/01/2015  . Lymphocytic colitis 01/16/2015  . Anemia, iron deficiency 01/16/2015  . Anxiety state 12/20/2014  . Diarrhea 11/27/2014  . Insomnia 11/27/2014  . History of DVT (deep vein thrombosis) 09/17/2014  . OSA (obstructive sleep apnea) 09/02/2014  . Gastritis, chronic 06/06/2014  . Unspecified gastritis and gastroduodenitis without mention of hemorrhage 05/03/2014  . Abdominal pain, epigastric 04/19/2014  . Internal hemorrhoids with other complication 51/88/4166  . Routine general medical examination at a health care facility 10/14/2011  . Thyroid nodule 05/16/2011  . Rectal bleeding 11/24/2010  . Hemorrhoids, internal 11/24/2010  . Irritable bowel syndrome (IBS) 11/24/2010  . ANEMIA 10/02/2009  . Hyperlipidemia 07/18/2009  . Essential hypertension 07/18/2009  . DYSPNEA ON EXERTION 07/18/2009  . THYROID CYST 01/31/2008  . ANXIETY 01/31/2008  . Depression with anxiety 01/31/2008  . GASTRIC ULCER, HX OF 01/31/2008  . FIBROIDS, UTERUS 11/13/2007  . ALLERGIC RHINITIS 11/13/2007  . ASTHMA 11/13/2007  . GERD 11/13/2007  . IRRITABLE BOWEL SYNDROME 11/13/2007  . INTERNAL HEMORRHOIDS 11/12/2003  Dyan Creelman T Tomasita Morrow, OTR/L, CLT   Jennice Renegar 08/30/2019, 9:08 AM  [place] MAIN [facility] SERVICES [address] [place], [place], [address] Phone: [phone]   Fax:  [phone]  Name: Carol Johnson MRN: [id number] Date of Birth: Jul 12, 1966

## 2019-09-01 ENCOUNTER — Other Ambulatory Visit: Payer: Self-pay | Admitting: Gastroenterology

## 2019-09-04 ENCOUNTER — Ambulatory Visit: Payer: BC Managed Care – PPO | Admitting: Physical Therapy

## 2019-09-04 ENCOUNTER — Other Ambulatory Visit: Payer: Self-pay

## 2019-09-04 ENCOUNTER — Encounter: Payer: Self-pay | Admitting: Physical Medicine & Rehabilitation

## 2019-09-04 ENCOUNTER — Encounter: Payer: 59 | Attending: Registered Nurse | Admitting: Physical Medicine & Rehabilitation

## 2019-09-04 ENCOUNTER — Encounter: Payer: BC Managed Care – PPO | Admitting: Occupational Therapy

## 2019-09-04 VITALS — BP 137/89 | HR 80 | Temp 97.8°F | Ht 63.0 in | Wt 208.0 lb

## 2019-09-04 DIAGNOSIS — I639 Cerebral infarction, unspecified: Secondary | ICD-10-CM | POA: Diagnosis present

## 2019-09-04 DIAGNOSIS — E7849 Other hyperlipidemia: Secondary | ICD-10-CM | POA: Diagnosis present

## 2019-09-04 DIAGNOSIS — R269 Unspecified abnormalities of gait and mobility: Secondary | ICD-10-CM | POA: Diagnosis not present

## 2019-09-04 DIAGNOSIS — M62838 Other muscle spasm: Secondary | ICD-10-CM | POA: Diagnosis present

## 2019-09-04 DIAGNOSIS — I69359 Hemiplegia and hemiparesis following cerebral infarction affecting unspecified side: Secondary | ICD-10-CM | POA: Diagnosis not present

## 2019-09-04 DIAGNOSIS — F5101 Primary insomnia: Secondary | ICD-10-CM | POA: Insufficient documentation

## 2019-09-04 MED ORDER — METHOCARBAMOL 500 MG PO TABS: 1000.0000 mg | ORAL_TABLET | Freq: Two times a day (BID) | ORAL | 2 refills | Status: DC | PRN

## 2019-09-04 NOTE — Progress Notes (Signed)
Subjective:    Patient ID: Carol Johnson, female    DOB: Feb 19, 1966, 54 y.o.   MRN: [id number]   HPI: Carol Johnson is a 54 y.o. female right-handed  with history of hypertension, ulcerative colitis, DVT 2016 left posterior tibial vein contributed to estrogen use and smoking, hyperlipidemia, asthma, presents for follow up for  right posterior corona radiata/PL IC infarction likely secondary small vessel disease.    Last clinic visit on 07/24/2019.  Since that time, patient followed up with neurology with plans for monitoring aneurysm-notes reviewed.  She is also in therapies, noting improving balance-notes reviewed.  Since that time, patient states OT is on hold. She followed up with Cards. She has returned to driving.  She has returned to working.  No issues reported. She is using Robaxin ~1/day. Denies falls, no longer requires cane. Reviewed ED notes with patient, per patient request.  Pain Inventory Average Pain 0 Pain Right Now 5 My pain is dull and aching  In the last 24 hours, has pain interfered with the following? General activity 5 Relation with others 5 Enjoyment of life 5 What TIME of day is your pain at its worst? evening Sleep (in general) Fair  Pain is worse with: evening Pain improves with: rest and medication Relief from Meds: 5  Mobility walk with assistance use a walker ability to climb steps?  no do you drive?  no  Function disabled: date disabled .  Neuro/Psych trouble walking spasms  Prior Studies transitional care  Physicians involved in your care transitional care   Family History  Problem Relation Age of Onset  . Pulmonary fibrosis Father   . Colon cancer Paternal Aunt   . Uterine cancer Paternal Grandmother        with mets to the colon   . Heart failure Maternal Grandmother   . Heart disease Paternal Uncle   . Heart disease Paternal Aunt   . Kidney disease Cousin   . Hyperlipidemia Mother   . Mitral valve prolapse Mother   .  Heart disease Mother        arrhythmia   Social History   Socioeconomic History  . Marital status: Legally Separated    Spouse name: Not on file  . Number of children: 1  . Years of education: Not on file  . Highest education level: Not on file  Occupational History  . Occupation: Admin. assistant  Tobacco Use  . Smoking status: Former Smoker    Packs/day: 0.10    Years: 10.00    Pack years: 1.00    Types: Cigarettes    Quit date: 12/07/2017    Years since quitting: 1.7  . Smokeless tobacco: Never Used  Substance and Sexual Activity  . Alcohol use: Not Currently    Alcohol/week: 5.0 standard drinks    Types: 5 Cans of beer per week  . Drug use: No  . Sexual activity: Yes    Birth control/protection: Pill  Other Topics Concern  . Not on file  Social History Narrative  . Not on file   Social Determinants of Health   Financial Resource Strain:   . Difficulty of Paying Living Expenses: Not on file  Food Insecurity:   . Worried About Charity fundraiser in the Last Year: Not on file  . Ran Out of Food in the Last Year: Not on file  Transportation Needs:   . Lack of Transportation (Medical): Not on file  . Lack of Transportation (Non-Medical): Not on  file  Physical Activity:   . Days of Exercise per Week: Not on file  . Minutes of Exercise per Session: Not on file  Stress:   . Feeling of Stress : Not on file  Social Connections:   . Frequency of Communication with Friends and Family: Not on file  . Frequency of Social Gatherings with Friends and Family: Not on file  . Attends Religious Services: Not on file  . Active Member of Clubs or Organizations: Not on file  . Attends Archivist Meetings: Not on file  . Marital Status: Not on file   Past Surgical History:  Procedure Laterality Date  . CESAREAN SECTION  2004  . CHOLECYSTECTOMY     laparoscopic "gallstones"  . ESOPHAGOGASTRODUODENOSCOPY (EGD) WITH PROPOFOL N/A 05/03/2014   Procedure:  ESOPHAGOGASTRODUODENOSCOPY (EGD) WITH PROPOFOL;  Surgeon: Inda Castle, MD;  Location: [facility];  Service: Endoscopy;  Laterality: N/A;  . MYOMECTOMY    . UTERINE FIBROID SURGERY     Past Medical History:  Diagnosis Date  . Abdominal pain, unspecified site   . Allergic rhinitis   . Anxiety   . Asthma   . Depression   . DVT (deep venous thrombosis) ([place]) 2016  . Fibroid uterus   . GERD (gastroesophageal reflux disease)   . Hypercholesterolemia   . Hypertension   . Internal hemorrhoid   . Irritable bowel syndrome   . Lymphocytic colitis 01/02/2015  . Pneumonia 1998  . Stroke ([place])   . Ulcerative colitis ([place])   . URI (upper respiratory infection)   . Uterine fibroid   . Vitamin B12 deficiency    BP 137/89   Pulse 80   Temp 97.8 F (36.6 C)   Ht 5' 3"  (1.6 m)   Wt 208 lb (94.3 kg)   LMP 11/29/2015   SpO2 97%   BMI 36.85 kg/m   Opioid Risk Score:   Fall Risk Score:  `1  Depression screen PHQ 2/9  Depression screen [facility] 2/9 07/30/2019 04/06/2019 07/04/2017  Decreased Interest 0 1 1  Down, Depressed, Hopeless 0 1 1  PHQ - 2 Score 0 2 2  Altered sleeping - 1 1  Tired, decreased energy - 1 1  Change in appetite - 2 2  Feeling bad or failure about yourself  - 2 1  Trouble concentrating - 1 0  Moving slowly or fidgety/restless - 0 1  Suicidal thoughts - 0 0  PHQ-9 Score - 9 8  Some recent data might be hidden    Review of Systems  Constitutional: Negative.   HENT: Negative.   Eyes: Negative.   Respiratory: Negative.   Cardiovascular: Negative.   Gastrointestinal: Negative.   Endocrine: Negative.   Genitourinary: Negative.   Musculoskeletal: Positive for arthralgias, gait problem and myalgias.       Spasms   Skin: Negative.   Allergic/Immunologic: Negative.   Neurological: Positive for weakness. Negative for numbness.  Hematological: Negative.   Psychiatric/Behavioral: Negative.       Objective:   Physical Exam: Constitutional: No distress . Vital  signs reviewed. HENT: Normocephalic.  Atraumatic. Eyes: EOMI. No discharge. Cardiovascular: No JVD. Respiratory: Normal effort.  No stridor. GI: Non-distended. Skin: Warm and dry.  Intact. Psych: Normal mood.  Normal behavior. Musc: No edema in extremities.  No tenderness in extremities. Neurological: Alert HOH Motor: RUE/RLE: 5/5 proximal distal LUE/LLE: 4+-5/5 proximal to distal    Assessment & Plan:  Female right-handed  with history of hypertension, ulcerative colitis, DVT 2016  left posterior tibial vein contributed to estrogen use and smoking, hyperlipidemia, asthma, presents for follow up for  right posterior corona radiata/PL IC infarction likely secondary small vessel disease.    1. Left-sided weakness secondary to right posterior corona radiata/PLIC infarction likely secondary small vessel disease.    Cont therapies  Cont follow up with Neurology  Followed up with Cards - no need for follow up at present  Returned to driving  Returned to work  2. Muscle spasms  Increase Robaxin to 1012m BID PRN  3. Morbid obesity  Cont weight loss  States she does not feel she needs dietitian referral at present  4. Gait abnormality, with LOB  Cont therapies  No longer requires assistive device

## 2019-09-06 ENCOUNTER — Encounter: Payer: BC Managed Care – PPO | Admitting: Occupational Therapy

## 2019-09-06 ENCOUNTER — Ambulatory Visit: Payer: 59 | Admitting: Physical Therapy

## 2019-09-11 ENCOUNTER — Ambulatory Visit: Payer: BC Managed Care – PPO | Admitting: Physical Therapy

## 2019-09-11 ENCOUNTER — Encounter: Payer: BC Managed Care – PPO | Admitting: Occupational Therapy

## 2019-09-13 ENCOUNTER — Encounter: Payer: BC Managed Care – PPO | Admitting: Occupational Therapy

## 2019-09-13 ENCOUNTER — Ambulatory Visit: Payer: 59 | Admitting: Physical Therapy

## 2019-09-17 ENCOUNTER — Other Ambulatory Visit: Payer: Self-pay | Admitting: Gastroenterology

## 2019-09-18 ENCOUNTER — Ambulatory Visit: Payer: BC Managed Care – PPO | Admitting: Physical Therapy

## 2019-09-18 ENCOUNTER — Encounter: Payer: BC Managed Care – PPO | Admitting: Occupational Therapy

## 2019-09-19 ENCOUNTER — Ambulatory Visit: Payer: 59 | Admitting: Gastroenterology

## 2019-09-20 ENCOUNTER — Ambulatory Visit: Payer: 59 | Admitting: Physical Therapy

## 2019-09-20 ENCOUNTER — Encounter: Payer: BC Managed Care – PPO | Admitting: Occupational Therapy

## 2019-09-25 ENCOUNTER — Ambulatory Visit: Payer: BC Managed Care – PPO | Admitting: Physical Therapy

## 2019-09-25 ENCOUNTER — Encounter: Payer: BC Managed Care – PPO | Admitting: Occupational Therapy

## 2019-09-27 ENCOUNTER — Encounter: Payer: Self-pay | Admitting: Physical Therapy

## 2019-09-27 ENCOUNTER — Encounter: Payer: BC Managed Care – PPO | Admitting: Occupational Therapy

## 2019-09-27 ENCOUNTER — Ambulatory Visit: Payer: 59 | Admitting: Physical Therapy

## 2019-09-27 NOTE — Therapy (Signed)
[place] MAIN [facility] SERVICES [address] [place], [place], [address] Phone: [phone]   Fax:  [phone]  September 27, 2019   No Recipients  Physical Therapy Discharge Summary  Patient: Carol Johnson  MRN: [id number]  Date of Birth: 1966/01/28   Diagnosis: No diagnosis found. Referring Provider (PT): Angiulli, Quillian Quince   The above patient had been seen in Physical Therapy 10 times of 25 treatments scheduled with 0 no shows and 15 cancellations.  The treatment consisted of strengthening, balance, gait training and instruction in a HEP The patient is: Improved  Subjective: Patient has missed several appointments due to work schedule and conflicts. Her last appointment was on 08/28/19 with a progress note done that date. Please refer to last progress note on 08/28/19 for patient's last objective findings.    Goals Partially Met    Sincerely,   Trotter,Margaret, PT, DPT   CC No Recipients  [place] [facility] SERVICES [address] [place], [place], [address] Phone: [phone]   Fax:  [phone]  Patient: Carol Johnson  MRN: [id number]  Date of Birth: [birth year]

## 2019-09-28 ENCOUNTER — Other Ambulatory Visit: Payer: Self-pay | Admitting: Gastroenterology

## 2019-10-02 ENCOUNTER — Ambulatory Visit: Payer: BC Managed Care – PPO | Admitting: Physical Therapy

## 2019-10-02 ENCOUNTER — Encounter: Payer: BC Managed Care – PPO | Admitting: Occupational Therapy

## 2019-10-04 ENCOUNTER — Ambulatory Visit: Payer: 59 | Admitting: Physical Therapy

## 2019-10-04 ENCOUNTER — Encounter: Payer: BC Managed Care – PPO | Admitting: Occupational Therapy

## 2019-10-06 ENCOUNTER — Other Ambulatory Visit: Payer: Self-pay | Admitting: Family Medicine

## 2019-10-08 ENCOUNTER — Encounter: Payer: Self-pay | Admitting: Family Medicine

## 2019-10-08 ENCOUNTER — Ambulatory Visit: Payer: BC Managed Care – PPO | Admitting: Physical Therapy

## 2019-10-08 NOTE — Telephone Encounter (Signed)
Name of [place] Name of Pharmacy:[facility] [address] Henrietta Dine or Written Date and Quantity:05/03/19 #30 tabs with 3 refills Last Office Visit and Type:f/u on 06/29/19 Next Office Visit and Type:none scheduled Last Controlled Substance Agreement Date:12/01/15 Last UDS:12/01/15

## 2019-10-11 ENCOUNTER — Ambulatory Visit: Payer: BC Managed Care – PPO | Admitting: Physical Therapy

## 2019-10-11 ENCOUNTER — Encounter: Payer: BC Managed Care – PPO | Admitting: Occupational Therapy

## 2019-10-16 ENCOUNTER — Encounter: Payer: BC Managed Care – PPO | Admitting: Occupational Therapy

## 2019-10-16 ENCOUNTER — Ambulatory Visit: Payer: BC Managed Care – PPO | Admitting: Physical Therapy

## 2019-10-18 ENCOUNTER — Encounter: Payer: BC Managed Care – PPO | Admitting: Occupational Therapy

## 2019-10-18 ENCOUNTER — Ambulatory Visit: Payer: BC Managed Care – PPO | Admitting: Physical Therapy

## 2019-10-22 ENCOUNTER — Other Ambulatory Visit: Payer: Self-pay | Admitting: Gastroenterology

## 2019-10-23 ENCOUNTER — Encounter: Payer: BC Managed Care – PPO | Admitting: Occupational Therapy

## 2019-10-23 ENCOUNTER — Ambulatory Visit: Payer: BC Managed Care – PPO | Admitting: Physical Therapy

## 2019-10-25 ENCOUNTER — Encounter: Payer: BC Managed Care – PPO | Admitting: Occupational Therapy

## 2019-10-25 ENCOUNTER — Ambulatory Visit: Payer: BC Managed Care – PPO | Admitting: Physical Therapy

## 2019-11-06 ENCOUNTER — Other Ambulatory Visit: Payer: Self-pay | Admitting: Gastroenterology

## 2019-11-06 ENCOUNTER — Telehealth: Payer: Self-pay

## 2019-11-06 ENCOUNTER — Other Ambulatory Visit: Payer: Self-pay

## 2019-11-06 MED ORDER — MESALAMINE 1.2 G PO TBEC: 1.2000 g | DELAYED_RELEASE_TABLET | Freq: Every day | ORAL | 0 refills | Status: DC

## 2019-11-06 NOTE — Telephone Encounter (Signed)
See the patient advise request message for details- Left a message for the patient to call back. Need to discuss the plan of care with her.

## 2019-11-06 NOTE — Telephone Encounter (Signed)
Patient did call back. Reports she has been out of mesalamine for 3 days.  She was given instructions at her last visit on tapering Mesalamine and then starting Colestid. Patient never taped off the Mesalamine. She did not take the Creon samples given to her.She did not start taking Colestid.  Patient states she was confused by the pharmacy information on Colestid describing it as a medication used to treat cholesterol. States she is under treatment for cholesterol, therefore she did not take any Colestid. I explained the plan that was discussed at her office visit on 08/07/19 and the intended follow up to see how she was doing. Patient seems to not fully grasp why she is coming off Mesalamine and has questions. Expresses concerns about discontinuing mesalamine. She agrees to come in for follow up. Instructed that she will be taking Mesalamine 1.2 grams once daily until she comes back in on 11/22/19 at which time she will likely be stopped. She expresses understanding of this plan.

## 2019-11-07 NOTE — Telephone Encounter (Signed)
Okay I will discuss this further during follow-up visit.  Thank you

## 2019-11-09 ENCOUNTER — Telehealth: Payer: Self-pay

## 2019-11-09 ENCOUNTER — Encounter: Payer: Self-pay | Admitting: Family Medicine

## 2019-11-09 ENCOUNTER — Encounter: Payer: Self-pay | Admitting: Adult Health

## 2019-11-09 NOTE — Telephone Encounter (Signed)
Actually, I have never heard of blood clots from the vaccine---but they are common with COVID infection.  Please urge her to get the vaccine tomorrow as planned. If it is of any interest, everyone in my family eligible (and I) have had their vaccines----as well as tens of millions of other Americans

## 2019-11-09 NOTE — Telephone Encounter (Signed)
Pt notified of Dr. Alla German comments and recommendations and verbalized understanding

## 2019-11-09 NOTE — Telephone Encounter (Signed)
Called pt and no answer and pt's VM Box was full, couldn't leave a VM, will try to call back later

## 2019-11-09 NOTE — Telephone Encounter (Signed)
Pt left v/m that she has sent mychart note about concerns prior to taking covid vaccine that pt is scheduled for on 11/10/19. Pt request cb after reviewed by provider. Dr Glori Bickers out of office. Sending note to Dr Silvio Pate for review.  I'm asking again about the covid vaccine because I am scheduled to get my first dose tomorrow- but I am having doubts because I hear in some other country they are having people die of blood clots after - so much so they suspended giving it out in some places.  With my history of blood clots and suspicion of small blood disease - stroke and still a lingering aneurysm inside my head that they will scan again next month - I'm quite concerned.  With all my history and immunity disorders (ulcerative colitis, asthma), I am worried now about lasting or even deathly side effects of the vaccine.  What would you tell me to do if I were your sister? Mom? Family member you love?

## 2019-11-10 ENCOUNTER — Ambulatory Visit: Payer: 59 | Attending: Internal Medicine

## 2019-11-10 DIAGNOSIS — Z23 Encounter for immunization: Secondary | ICD-10-CM

## 2019-11-10 NOTE — Progress Notes (Signed)
   Covid-19 Vaccination Clinic  Name:  Carol Johnson    MRN: [id number] DOB: June 26, 1966  11/10/2019  [name] was observed post Covid-19 immunization for 15 minutes without incident. She was provided with Vaccine Information Sheet and instruction to access the V-Safe system.   [name] was instructed to call 911 with any severe reactions post vaccine: Marland Kitchen Difficulty breathing  . Swelling of face and throat  . A fast heartbeat  . A bad rash all over body  . Dizziness and weakness   Immunizations Administered    Name Date Dose VIS Date Route   Pfizer COVID-19 Vaccine 11/10/2019 11:16 AM 0.3 mL 08/10/2019 Intramuscular   Manufacturer: [place]   Lot: [id number]   [place]: [id number]

## 2019-11-13 ENCOUNTER — Telehealth: Payer: Self-pay

## 2019-11-13 ENCOUNTER — Ambulatory Visit: Payer: 59 | Admitting: Family Medicine

## 2019-11-13 ENCOUNTER — Other Ambulatory Visit: Payer: Self-pay

## 2019-11-13 ENCOUNTER — Encounter: Payer: Self-pay | Admitting: Family Medicine

## 2019-11-13 ENCOUNTER — Ambulatory Visit
Admission: RE | Admit: 2019-11-13 | Discharge: 2019-11-13 | Disposition: A | Discharge status: Home / Self Care | Payer: 59 | Source: Ambulatory Visit | Attending: Family Medicine | Admitting: Family Medicine

## 2019-11-13 VITALS — BP 140/76 | HR 79 | Temp 97.9°F | Ht 63.0 in | Wt 209.4 lb

## 2019-11-13 DIAGNOSIS — M79661 Pain in right lower leg: Secondary | ICD-10-CM | POA: Insufficient documentation

## 2019-11-13 DIAGNOSIS — Z8711 Personal history of peptic ulcer disease: Secondary | ICD-10-CM

## 2019-11-13 DIAGNOSIS — K51919 Ulcerative colitis, unspecified with unspecified complications: Secondary | ICD-10-CM | POA: Diagnosis not present

## 2019-11-13 DIAGNOSIS — Z86718 Personal history of other venous thrombosis and embolism: Secondary | ICD-10-CM | POA: Diagnosis not present

## 2019-11-13 DIAGNOSIS — I69354 Hemiplegia and hemiparesis following cerebral infarction affecting left non-dominant side: Secondary | ICD-10-CM

## 2019-11-13 MED ORDER — RIVAROXABAN (XARELTO) VTE STARTER PACK (15 & 20 MG): ORAL_TABLET | ORAL | 0 refills | Status: DC

## 2019-11-13 NOTE — Assessment & Plan Note (Signed)
H/o this, followed by PM&R and neurology. States has aneurysm that is being monitored.

## 2019-11-13 NOTE — Assessment & Plan Note (Signed)
Concerning R calf and posterior thigh tenderness in personal h/o LLE DVT 2016 s/p xarelto 6 mo treatment. Overall benign exam, but prudent to send for stat venous US r/o DVT. Provided with WASP for xarelto starter course in case this returns positive. ER precautions reviewed (chest pain, dyspnea). Pt agrees with plan.

## 2019-11-13 NOTE — Telephone Encounter (Signed)
[facility] - Client TELEPHONE ADVICE RECORD [facility] Patient Name: Carol Johnson Gender: Female DOB: 1966-02-26 Age: 54 Y 49 M 41 D Return Phone Number: [phone] (Primary) Address: City/State/Zip: [place] [address] Client [facility] - Client Client Site [place], Roque Lias - MD Contact Type Call Who Is Calling Patient / Member / Family / Caregiver Call Type Triage / Clinical Caller Name Apryll Relationship To Patient Self Return Phone Number [phone] (Primary) Chief Complaint NUMBNESS/TINGLING- sudden on one side of the body or face Reason for Call Symptomatic / Request for Health Information Initial Comment RN Jerene Pitch is transferring a former stroke pt. with a blot clot in her right calf and thigh who has pain, redness, and warm to touch on her right leg. Translation No Nurse Assessment Nurse: Alvis Lemmings, RN, Marcie Bal Date/Time [name] Time): 11/13/2019 11:19:38 AM Confirm and document reason for call. If symptomatic, describe symptoms. ---Caller states she is a former stroke(last yr)pt. with a blot clot in her right calf and thigh who has pain, redness, and warm to touch on her right leg. On ASA. Constant pain this a.m. Has the patient had close contact with a person known or suspected to have the novel coronavirus illness OR traveled / lives in area with major community spread (including international travel) in the last 14 days from the onset of symptoms? * If Asymptomatic, screen for exposure and travel within the last 14 days. ---No Does the patient have any new or worsening symptoms? ---Yes Will a triage be completed? ---Yes Related visit to physician within the last 2 weeks? ---No Does the PT have any chronic conditions? (i.e. diabetes, asthma, this includes High risk factors for pregnancy, etc.) ---Yes List chronic conditions. ---clots, stroke, asa, UC, asthma,  fibroids hx, chole, HTN, cholesterol, small vein disease Is this a behavioral health or substance abuse call? ---No Guidelines Guideline Title Affirmed Question Affirmed Notes Nurse Date/Time [name] Time) Leg Pain [1] Thigh or calf pain AND [2] only 1 side AND [3] present > 1 Alvis Lemmings, Jonelle Sidle 11/13/2019 11:23:21 AM PLEASE NOTE: All timestamps contained within this report are represented as [place] Standard Time. CONFIDENTIALTY NOTICE: This fax transmission is intended only for the addressee. It contains information that is legally privileged, confidential or otherwise protected from use or disclosure. If you are not the intended recipient, you are strictly prohibited from reviewing, disclosing, copying using or disseminating any of this information or taking any action in reliance on or regarding this information. If you have received this fax in error, please notify us immediately by telephone so that we can arrange for its return to [place]. Phone: [phone], Toll-Free: [phone], Fax: [phone] Page: 2 of 2 Call Id: [id number] Guidelines Guideline Title Affirmed Question Affirmed Notes Nurse Date/Time [name] Time) hour (Exception: chronic unchanged pain) Disp. Time [name] Time) Disposition Final User 11/13/2019 11:15:36 AM Send to Urgent Vassie Loll 11/13/2019 11:25:13 AM See HCP within 4 Hours (or PCP triage) Yes Alvis Lemmings, RN, Lenon Oms Disagree/Comply Comply Caller Understands Yes PreDisposition Call Doctor Care Advice Given Per Guideline SEE HCP WITHIN 4 HOURS (OR PCP TRIAGE): * IF OFFICE WILL BE OPEN: You need to be seen within the next 3 or 4 hours. Call your doctor (or NP/PA) now or as soon as the office opens. CARE ADVICE given per Leg Pain (Adult) guideline. CALL EMS IF: You develop any chest pain or shortness of breath. Referrals REFERRED TO PCP OFFICE

## 2019-11-13 NOTE — Patient Instructions (Addendum)
See Rosaria Ferries for leg ultrasound today.  We will be in touch with results. I have printed xarelto prescription in case we need to start it today.  If any shortness of breath or chest pain, seek ER care.

## 2019-11-13 NOTE — Telephone Encounter (Signed)
Attempted to contact pt.  Vm box is full.  Need to inform her we forgot to provide her with the After Visit Summary (AVS) and printed rx.  It's available for her to pick up.  [Placed AVS & rx at front office- blue folders.]

## 2019-11-13 NOTE — Telephone Encounter (Signed)
Per Raquel Sarna, pt returned call.  States she will need rx sent to [facility] since she will not be able to get back her to pick up printed rx.

## 2019-11-13 NOTE — Progress Notes (Signed)
This visit was conducted in person.  BP 140/76 (BP Location: Left Arm, Patient Position: Sitting, Cuff Size: Large)   Pulse 79   Temp 97.9 F (36.6 C) (Temporal)   Ht 5' 3"  (1.6 m)   Wt 209 lb 6 oz (95 kg)   LMP 11/29/2015   SpO2 97%   BMI 37.09 kg/m    CC: posterior R leg pain Subjective:    Patient ID: Carol Johnson, female    DOB: May 31, 1966, 54 y.o.   MRN: [id number]  HPI: Carol Johnson is a 54 y.o. female presenting on 11/13/2019 for Leg Pain (C/o posterior right leg pain.  Started last night.  )   Last night started having R posterior calf and leg pain. Denies inciting trauma/injury or falls. She does sit on hre computer all day - sedentary. Not on hormonal treatment.   Denies dyspnea or chest pain.  Stays fatigued with some daytime sleepiness.  She had negative evaluation for sleep apnea.   H/o blood clot to L leg 2016, treated treated with 6 months of xarelto.  Stroke 05/2019 - aneurysm burst. She is on aspirin. She sees Dr End and Dr Posey Pronto neurology.   No fmhx blood clots.  + fmhx strokes  She just got her 1st [place] covid vaccine 11/10/2019.      Relevant past medical, surgical, family and social history reviewed and updated as indicated. Interim medical history since our last visit reviewed. Allergies and medications reviewed and updated. Outpatient Medications Prior to Visit  Medication Sig Dispense Refill  . acetaminophen (TYLENOL) 325 MG tablet Take 2 tablets (650 mg total) by mouth every 4 (four) hours as needed for mild pain (or temp > 37.5 C (99.5 F)). (Patient taking differently: Take 650 mg by mouth as needed for mild pain (or temp > 37.5 C (99.5 F)). )    . albuterol (PROVENTIL HFA;VENTOLIN HFA) 108 (90 Base) MCG/ACT inhaler Inhale 2 puffs into the lungs every 4 (four) hours as needed for wheezing or shortness of breath (cough, shortness of breath or wheezing.). 1 Inhaler 1  . amoxicillin-clavulanate (AUGMENTIN) 875-125 MG tablet Take 1 tablet by mouth 2  (two) times daily. 14 tablet 0  . aspirin EC 81 MG EC tablet Take 1 tablet (81 mg total) by mouth daily. 30 tablet 0  . atorvastatin (LIPITOR) 80 MG tablet TAKE 1 TABLET (80 MG TOTAL) BY MOUTH DAILY AT 6 PM. 90 tablet 1  . buPROPion (WELLBUTRIN XL) 300 MG 24 hr tablet Take 1 tablet (300 mg total) by mouth daily. 90 tablet 3  . citalopram (CELEXA) 20 MG tablet Take 1 tablet (20 mg total) by mouth daily. 90 tablet 3  . colestipol (COLESTID) 1 g tablet TAKE 1 TABLET (1 G TOTAL) BY MOUTH 2 (TWO) TIMES DAILY. 60 tablet 1  . fexofenadine (ALLEGRA) 180 MG tablet Take 180 mg by mouth daily as needed.     . loperamide (IMODIUM) 2 MG capsule Take 1 capsule (2 mg total) by mouth as needed for diarrhea or loose stools. 30 capsule 0  . mesalamine (LIALDA) 1.2 g EC tablet Take 1 tablet (1.2 g total) by mouth daily with breakfast. Keep appointment on 11/22/19 20 tablet 0  . methocarbamol (ROBAXIN) 500 MG tablet Take 2 tablets (1,000 mg total) by mouth 2 (two) times daily as needed for muscle spasms. 120 tablet 2  . pantoprazole (PROTONIX) 40 MG tablet Take 1 tablet (40 mg total) by mouth daily. 90 tablet 3  .  saccharomyces boulardii (FLORASTOR) 250 MG capsule Take 1 capsule (250 mg total) by mouth 2 (two) times daily. 60 capsule 0  . zolpidem (AMBIEN CR) 12.5 MG CR tablet TAKE 1 TABLET (12.5 MG TOTAL) BY MOUTH AT BEDTIME AS NEEDED FOR SLEEP 30 tablet 3   No facility-administered medications prior to visit.     Per HPI unless specifically indicated in ROS section below Review of Systems Objective:    BP 140/76 (BP Location: Left Arm, Patient Position: Sitting, Cuff Size: Large)   Pulse 79   Temp 97.9 F (36.6 C) (Temporal)   Ht 5' 3"  (1.6 m)   Wt 209 lb 6 oz (95 kg)   LMP 11/29/2015   SpO2 97%   BMI 37.09 kg/m   Wt Readings from Last 3 Encounters:  11/13/19 209 lb 6 oz (95 kg)  09/04/19 208 lb (94.3 kg)  08/07/19 209 lb 8 oz (95 kg)    Physical Exam Vitals and nursing note reviewed.    Constitutional:      Appearance: Normal appearance. She is not ill-appearing.  Musculoskeletal:        General: Tenderness present. Normal range of motion.     Right lower leg: No edema.     Left lower leg: No edema.     Comments:  2+ DP bilaterally R calf circ 41cm L calf circ 41cm Tender to palpation at R calf and posterior thigh without palpable cords  Skin:    General: Skin is warm and dry.     Findings: No erythema or rash.  Neurological:     Mental Status: She is alert.  Psychiatric:        Mood and Affect: Mood normal.        Behavior: Behavior normal.       Lab Results  Component Value Date   CREATININE 0.81 06/05/2019   BUN 11 06/05/2019   NA 140 06/05/2019   K 4.1 06/05/2019   CL 105 06/05/2019   CO2 25 06/05/2019    Assessment & Plan:  This visit occurred during the SARS-CoV-2 public health emergency.  Safety protocols were in place, including screening questions prior to the visit, additional usage of staff PPE, and extensive cleaning of exam room while observing appropriate contact time as indicated for disinfecting solutions.   Problem List Items Addressed This Visit    Ulcerative colitis with complication ([place])    Endorses h/o this, on lialda and florastor      Right calf pain - Primary    Concerning R calf and posterior thigh tenderness in personal h/o LLE DVT 2016 s/p xarelto 6 mo treatment. Overall benign exam, but prudent to send for stat venous US r/o DVT. Provided with WASP for xarelto starter course in case this returns positive. ER precautions reviewed (chest pain, dyspnea). Pt agrees with plan.       Relevant Orders   US Venous Img Lower Bilateral   History of DVT (deep vein thrombosis)   Hemiplegia and hemiparesis following cerebral infarction affecting left non-dominant side [facility])    H/o this, followed by PM&R and neurology. States has aneurysm that is being monitored.       GASTRIC ULCER, HX OF    Continue protonix.           Meds  ordered this encounter  Medications  . DISCONTD: Rivaroxaban 15 & 20 MG TBPK    Sig: Follow package directions: Take one 52m tablet by mouth twice a day. On day 22, switch  to one 108m tablet once a day. Take with food.    Dispense:  51 each    Refill:  0  . Rivaroxaban 15 & 20 MG TBPK    Sig: Follow package directions: Take one 154mtablet by mouth twice a day. On day 22, switch to one 2084mablet once a day. Take with food.    Dispense:  51 each    Refill:  0   Orders Placed This Encounter  Procedures  . US Koreanous Img Lower Bilateral    Standing Status:   Future    Number of Occurrences:   1    Standing Expiration Date:   01/12/2021    Order Specific Question:   Reason for Exam (SYMPTOM  OR DIAGNOSIS REQUIRED)    Answer:   R calf and posterior thigh pain in h/o DVT    Order Specific Question:   Preferred imaging location?    Answer:   [facility]    Order Specific Question:   Call Results- Best Contact Number?    Answer:   [phone] GutDanise Minahold pt     Follow up plan: No follow-ups on file.  JavRia BushD

## 2019-11-13 NOTE — Assessment & Plan Note (Signed)
Continue protonix  

## 2019-11-13 NOTE — Assessment & Plan Note (Signed)
Endorses h/o this, on lialda and florastor

## 2019-11-13 NOTE — Telephone Encounter (Signed)
Pt already scheduled to see Dr Darnell Level 11/13/19 at 3:45.

## 2019-11-16 ENCOUNTER — Ambulatory Visit: Payer: 59 | Admitting: Gastroenterology

## 2019-11-22 ENCOUNTER — Ambulatory Visit: Payer: 59 | Admitting: Gastroenterology

## 2019-11-22 ENCOUNTER — Encounter: Payer: Self-pay | Admitting: Gastroenterology

## 2019-11-22 VITALS — BP 124/70 | HR 86 | Temp 97.9°F | Ht 63.0 in | Wt 209.0 lb

## 2019-11-22 DIAGNOSIS — K52832 Lymphocytic colitis: Secondary | ICD-10-CM

## 2019-11-22 DIAGNOSIS — I82402 Acute embolism and thrombosis of unspecified deep veins of left lower extremity: Secondary | ICD-10-CM

## 2019-11-22 DIAGNOSIS — K580 Irritable bowel syndrome with diarrhea: Secondary | ICD-10-CM | POA: Diagnosis not present

## 2019-11-22 DIAGNOSIS — Z7901 Long term (current) use of anticoagulants: Secondary | ICD-10-CM | POA: Diagnosis not present

## 2019-11-22 DIAGNOSIS — Z8673 Personal history of transient ischemic attack (TIA), and cerebral infarction without residual deficits: Secondary | ICD-10-CM

## 2019-11-22 DIAGNOSIS — K9089 Other intestinal malabsorption: Secondary | ICD-10-CM | POA: Diagnosis not present

## 2019-11-22 MED ORDER — COLESTIPOL HCL 1 G PO TABS: 2.0000 g | ORAL_TABLET | Freq: Every day | ORAL | 2 refills | Status: DC

## 2019-11-22 NOTE — Progress Notes (Signed)
Carol Johnson    [id number]    1965-09-07  Primary Care Physician:Tower, Wynelle Fanny, MD  Referring Physician: Tower, Wynelle Fanny, MD [place],  [place] [id number]   Chief complaint: Diarrhea, history of lymphocytic colitis HPI: 54 year old female with history of lymphocytic colitis here for follow-up visit for chronic diarrhea  Lymphocytic colitis diagnosed in 2016.  She was treated with budesonide for a year by Dr. Deatra Ina, was tapered off in 2017.  Dr. Deatra Ina also started her on Lialda at the time for lymphocytic colitis.  She has been taking 4.8 g daily since then.  Her symptoms were stable until she had stroke in September 2020, had a flare with worsening diarrhea.  She started having 3-4 watery to semiformed bowel movements daily. Recommended patient to taper off mesalamine as could be exacerbating her symptoms and plan was for her to come off mesalamine but she remained reluctant to completely come off, was worried of precipitating an acute flare of symptoms.  She is currently taking 1.2 g of mesalamine daily  Since starting Colestid 1 g daily her bowel habits have somewhat improved and the diarrhea is not as bad  Denies any nausea, vomiting, abdominal pain, melena or bright red blood per rectum  No family history of colon cancer or IBD  Recently started on anticoagulation with Xarelto for new DVT in lower extremity   Outpatient Encounter Medications as of 11/22/2019  Medication Sig  . acetaminophen (TYLENOL) 325 MG tablet Take 2 tablets (650 mg total) by mouth every 4 (four) hours as needed for mild pain (or temp > 37.5 C (99.5 F)). (Patient taking differently: Take 650 mg by mouth as needed for mild pain (or temp > 37.5 C (99.5 F)). )  . albuterol (PROVENTIL HFA;VENTOLIN HFA) 108 (90 Base) MCG/ACT inhaler Inhale 2 puffs into the lungs every 4 (four) hours as needed for wheezing or shortness of breath (cough, shortness of breath or wheezing.).  Marland Kitchen  aspirin EC 81 MG EC tablet Take 1 tablet (81 mg total) by mouth daily.  Marland Kitchen atorvastatin (LIPITOR) 80 MG tablet TAKE 1 TABLET (80 MG TOTAL) BY MOUTH DAILY AT 6 PM.  . buPROPion (WELLBUTRIN XL) 300 MG 24 hr tablet Take 1 tablet (300 mg total) by mouth daily.  . citalopram (CELEXA) 20 MG tablet Take 1 tablet (20 mg total) by mouth daily.  . colestipol (COLESTID) 1 g tablet TAKE 1 TABLET (1 G TOTAL) BY MOUTH 2 (TWO) TIMES DAILY.  . fexofenadine (ALLEGRA) 180 MG tablet Take 180 mg by mouth daily as needed.   . loperamide (IMODIUM) 2 MG capsule Take 1 capsule (2 mg total) by mouth as needed for diarrhea or loose stools.  . mesalamine (LIALDA) 1.2 g EC tablet Take 1 tablet (1.2 g total) by mouth daily with breakfast. Keep appointment on 11/22/19  . methocarbamol (ROBAXIN) 500 MG tablet Take 2 tablets (1,000 mg total) by mouth 2 (two) times daily as needed for muscle spasms.  . pantoprazole (PROTONIX) 40 MG tablet Take 1 tablet (40 mg total) by mouth daily.  . Rivaroxaban 15 & 20 MG TBPK Follow package directions: Take one 35m tablet by mouth twice a day. On day 22, switch to one 248mtablet once a day. Take with food.  . saccharomyces boulardii (FLORASTOR) 250 MG capsule Take 1 capsule (250 mg total) by mouth 2 (two) times daily.  . Marland Kitchenolpidem (AMBIEN CR) 12.5 MG CR tablet  TAKE 1 TABLET (12.5 MG TOTAL) BY MOUTH AT BEDTIME AS NEEDED FOR SLEEP  . [DISCONTINUED] amoxicillin-clavulanate (AUGMENTIN) 875-125 MG tablet Take 1 tablet by mouth 2 (two) times daily.   No facility-administered encounter medications on file as of 11/22/2019.    Allergies as of 11/22/2019 - Review Complete 11/22/2019  Allergen Reaction Noted  . Sertraline hcl  01/31/2008  . Zoloft [sertraline hcl] Diarrhea 04/19/2014    Past Medical History:  Diagnosis Date  . Abdominal pain, unspecified site   . Allergic rhinitis   . Anxiety   . Asthma   . Depression   . DVT (deep venous thrombosis) ([place]) 2016  . Fibroid uterus   . GERD  (gastroesophageal reflux disease)   . Hypercholesterolemia   . Hypertension   . Internal hemorrhoid   . Irritable bowel syndrome   . Lymphocytic colitis 01/02/2015  . Pneumonia 1998  . Stroke ([place])   . Ulcerative colitis ([place])   . URI (upper respiratory infection)   . Uterine fibroid   . Vitamin B12 deficiency     Past Surgical History:  Procedure Laterality Date  . CESAREAN SECTION  2004  . CHOLECYSTECTOMY     laparoscopic "gallstones"  . ESOPHAGOGASTRODUODENOSCOPY (EGD) WITH PROPOFOL N/A 05/03/2014   Procedure: ESOPHAGOGASTRODUODENOSCOPY (EGD) WITH PROPOFOL;  Surgeon: Inda Castle, MD;  Location: [facility] END[facility]COPY;  Service: Endoscopy;  Laterality: N/A;  . MYOMECTOMY    . UTERINE FIBROID SURGERY      Family History  Problem Relation Age of Onset  . Pulmonary fibrosis Father   . Colon cancer Paternal Aunt   . Uterine cancer Paternal Grandmother        with mets to the colon   . Heart failure Maternal Grandmother   . Heart disease Paternal Uncle   . Heart disease Paternal Aunt   . Kidney disease Cousin   . Hyperlipidemia Mother   . Mitral valve prolapse Mother   . Heart disease Mother        arrhythmia    Social History   Socioeconomic History  . Marital status: Legally Separated    Spouse name: Not on file  . Number of children: 1  . Years of education: Not on file  . Highest education level: Not on file  Occupational History  . Occupation: Admin. assistant  Tobacco Use  . Smoking status: Former Smoker    Packs/day: 0.10    Years: 10.00    Pack years: 1.00    Types: Cigarettes    Quit date: 12/07/2017    Years since quitting: 1.9  . Smokeless tobacco: Never Used  Substance and Sexual Activity  . Alcohol use: Not Currently    Alcohol/week: 5.0 standard drinks    Types: 5 Cans of beer per week  . Drug use: No  . Sexual activity: Yes    Birth control/protection: Pill  Other Topics Concern  . Not on file  Social History Narrative  . Not on file   Social  Determinants of Health   Financial Resource Strain:   . Difficulty of Paying Living Expenses:   Food Insecurity:   . Worried About Charity fundraiser in the Last Year:   . Arboriculturist in the Last Year:   Transportation Needs:   . Film/video editor (Medical):   Marland Kitchen Lack of Transportation (Non-Medical):   Physical Activity:   . Days of Exercise per Week:   . Minutes of Exercise per Session:   Stress:   .  Feeling of Stress :   Social Connections:   . Frequency of Communication with Friends and Family:   . Frequency of Social Gatherings with Friends and Family:   . Attends Religious Services:   . Active Member of Clubs or Organizations:   . Attends Archivist Meetings:   Marland Kitchen Marital Status:   Intimate Partner Violence:   . Fear of Current or Ex-Partner:   . Emotionally Abused:   Marland Kitchen Physically Abused:   . Sexually Abused:       Review of systems: Review of system positive for excessive hair loss All other review of systems negative except as mentioned in the HPI.   Physical Exam: Vitals:   11/22/19 1517  BP: 124/70  Pulse: 86  Temp: 97.9 F (36.6 C)   Body mass index is 37.02 kg/m. Gen:      No acute distress HEENT:  EOMI, sclera anicteric Neck:     No masses; no thyromegaly Lungs:    Clear to auscultation bilaterally; normal respiratory effort CV:         Regular rate and rhythm; no murmurs Abd:      + bowel sounds; soft, non-tender; no palpable masses, no distension Ext:    No edema; adequate peripheral perfusion Skin:      Warm and dry; no rash Neuro: alert and oriented x 3 Psych: normal mood and affect  Data Reviewed:  Reviewed labs, radiology imaging, old records and pertinent past GI work up   Assessment and Plan/Recommendations:  54 year old female with history of CVA, new DVT on Xarelto  History of microscopic lymphocytic colitis  Chronic diarrhea likely multifactorial Persistent microscopic colitis versus IBS versus bile salt  induced Given recent CVA, DVT and need for anticoagulation.  Will hold off repeat colonoscopy with biopsies for evaluation at this point  Encouraged patient to come off mesalamine as it is not recommended therapy for long-term for microscopic colitis.  It could be potentially causing some side effects especially hair loss Continue to monitor symptoms closely and call with any change in bowel habits  Increase Colestid to 2 g daily, advised patient to avoid taking it within 4 hours of other medication to avoid drug interaction  Return in 6 months or sooner if needed    This visit required 35 minutes of patient care (this includes precharting, chart review, review of results, face-to-face time used for counseling as well as treatment plan and follow-up. The patient was provided an opportunity to ask questions and all were answered. The patient agreed with the plan and demonstrated an understanding of the instructions.  Damaris Hippo , MD    CC: Tower, Wynelle Fanny, MD

## 2019-11-22 NOTE — Patient Instructions (Signed)
We have sent the following medications to your pharmacy for you to pick up at your convenience: Colestid - 2 grams once daily.   Stop Mesalamine(Lialda) when finished with current prescription.   Multi-vitamin - 1 capsule once daily.   Follow-up in 6 months.    If you are age 55 or younger, your body mass index should be between 19-25. Your Body mass index is 37.02 kg/m. If this is out of the aformentioned range listed, please consider follow up with your Primary Care Provider.   Thank you for choosing me and [facility].  Dr.Nandigam

## 2019-11-26 ENCOUNTER — Telehealth: Payer: Self-pay | Admitting: Family Medicine

## 2019-11-26 NOTE — Telephone Encounter (Signed)
I attempted to call patient.  Patient's voice mail is full. Dr.Tower requested please schedule f/u with me when I return"   (to discuss DVT), please schedule when able.  If patient returns my call, please schedule.

## 2019-11-26 NOTE — Telephone Encounter (Signed)
Patient scheduled appointment on 12/03/19.

## 2019-11-27 ENCOUNTER — Encounter: Payer: Self-pay | Admitting: Adult Health

## 2019-11-27 ENCOUNTER — Ambulatory Visit: Payer: 59 | Admitting: Adult Health

## 2019-11-27 ENCOUNTER — Telehealth: Payer: Self-pay | Admitting: Adult Health

## 2019-11-27 VITALS — BP 126/86 | HR 81 | Temp 97.6°F | Ht 62.0 in | Wt 209.0 lb

## 2019-11-27 DIAGNOSIS — E785 Hyperlipidemia, unspecified: Secondary | ICD-10-CM | POA: Diagnosis not present

## 2019-11-27 DIAGNOSIS — I671 Cerebral aneurysm, nonruptured: Secondary | ICD-10-CM | POA: Diagnosis not present

## 2019-11-27 DIAGNOSIS — I639 Cerebral infarction, unspecified: Secondary | ICD-10-CM | POA: Diagnosis not present

## 2019-11-27 DIAGNOSIS — G44209 Tension-type headache, unspecified, not intractable: Secondary | ICD-10-CM

## 2019-11-27 DIAGNOSIS — I82411 Acute embolism and thrombosis of right femoral vein: Secondary | ICD-10-CM

## 2019-11-27 DIAGNOSIS — I10 Essential (primary) hypertension: Secondary | ICD-10-CM

## 2019-11-27 MED ORDER — TOPIRAMATE 25 MG PO TABS: 25.0000 mg | ORAL_TABLET | Freq: Two times a day (BID) | ORAL | 3 refills | Status: DC

## 2019-11-27 NOTE — Progress Notes (Signed)
[facility] [address] [place]. [place] [address] [phone]       STROKE FOLLOW UP NOTE  [name] Date of Birth:  May 20, 1966 Medical Record Number:  [id number]   Reason for Referral: stroke follow up    CHIEF COMPLAINT:  Chief Complaint  Patient presents with  . Follow-up    pt alone, rm 9. pt states that she has been having a lot of headaches. she has had some tenderness on scalp of head.Marland Kitchen    HPI:  Today, 11/27/2019, Carol Johnson is being seen for stroke follow-up.  She has been stable from a stroke standpoint with residual mild left-sided weakness with ongoing improvement as well as ongoing headaches which have been present since her stroke.  Describes as pressure sensation in the back and right side of head typically occurs in the evening time.  Denies photophobia, phonophobia, N/V, visual changes or auras.  At times, headaches can be debilitating and has been using Tylenol daily with mild relief.  Recently found to have left femoral nonocclusive DVT on 11/13/2019 by PCP currently on Xarelto.  She is also continued on aspirin and denies bleeding or bruising.  Continues on atorvastatin without side effects.  Blood pressure today satisfactory at 126/86.  Initially elevated with concern of hypertension contributing to headaches but found inadequate blood pressure cuff cause of inaccurate blood pressure readings.  No further concerns at this time.     History: Provided for reference purposes only Stroke admission 05/23/2019: Carol Johnson is a 54 y.o. female with history of of hypertension, depression, hypercholesterolemia, ulcerative colitis, irritable bowel, B12 deficiency, DVT in the past not on anticoagulation, asthma and concern for pulmonary fibrosis presented on 05/23/19 with L sided weakness.  Stroke work-up showed a posterior corona radiata/PL IC infarct and AchA territory infarct as evidenced on MRI likely secondary to small vessel disease  source.  MRA head negative for LVO but did show right ACA A2 2 mm aneurysm.  MRA neck unremarkable.  2D echo unremarkable.  Due to AchA territory infarct, recommended 30-day cardiac event monitoring to rule out atrial fibrillation.  Recommended DAPT for 3 weeks and aspirin alone.  HTN stable.  LDL 166 and increase atorvastatin dose to 80 mg daily.  A1c 6.2 with a prior history of DM and recommended close PCP follow-up.  History of left posterior tibial vein DVT in 08/2014 contributing to estrogen use and smoking treated with Xarelto course.  Other stroke risk factors include former tobacco use, alcohol use and morbid obesity but no prior history of stroke.  Other active include thrombocytosis platelet 499, depression with anxiety and Wellbutrin and Celexa and ulcerative colitis on mesalamine.  Residual deficits of left hemiparesis and discharged to CIR on 05/25/2019 for ongoing therapy.  Discharged home on 06/06/2019 with recommendation of outpatient therapy.   Initial visit 07/30/2019 JM: Carol Johnson is a 54 year old female who is being seen today for hospital follow-up.  Residual deficits of mild left-sided weakness but overall great improvement.  She continues to work with outpatient therapies.  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  She is able to ambulate with a cane for long distance but is unassisted at home.  Denies any recent falls.  Completed 3 weeks DAPT and continues on aspirin alone without bleeding or bruising.  Continues on atorvastatin 80 mg daily without myalgias.  Reports recent lab work by PCP lipid panel satisfactory.  Blood pressure today 126/78.  Returned cardiac event monitor to cardiology yesterday completed 3 weeks on a 4-week recommendation due to skin irritation.  Reports stable depression/anxiety with ongoing use of Wellbutrin and Celexa.  No further concerns at this time.   ROS:   14 system review of systems performed  and negative with exception of headaches  PMH:  Past Medical History:  Diagnosis Date  . Abdominal pain, unspecified site   . Allergic rhinitis   . Anxiety   . Asthma   . Depression   . DVT (deep venous thrombosis) ([place]) 2016  . Fibroid uterus   . GERD (gastroesophageal reflux disease)   . Hypercholesterolemia   . Hypertension   . Internal hemorrhoid   . Irritable bowel syndrome   . Lymphocytic colitis 01/02/2015  . Pneumonia 1998  . Stroke ([place])   . Ulcerative colitis ([place])   . URI (upper respiratory infection)   . Uterine fibroid   . Vitamin B12 deficiency     PSH:  Past Surgical History:  Procedure Laterality Date  . CESAREAN SECTION  2004  . CHOLECYSTECTOMY     laparoscopic "gallstones"  . ESOPHAGOGASTRODUODENOSCOPY (EGD) WITH PROPOFOL N/A 05/03/2014   Procedure: ESOPHAGOGASTRODUODENOSCOPY (EGD) WITH PROPOFOL;  Surgeon: Inda Castle, MD;  Location: [facility];  Service: Endoscopy;  Laterality: N/A;  . MYOMECTOMY    . UTERINE FIBROID SURGERY      Social History:  Social History   Socioeconomic History  . Marital status: Legally Separated    Spouse name: Not on file  . Number of children: 1  . Years of education: Not on file  . Highest education level: Not on file  Occupational History  . Occupation: Admin. assistant  Tobacco Use  . Smoking status: Former Smoker    Packs/day: 0.10    Years: 10.00    Pack years: 1.00    Types: Cigarettes    Quit date: 12/07/2017    Years since quitting: 1.9  . Smokeless tobacco: Never Used  Substance and Sexual Activity  . Alcohol use: Not Currently    Alcohol/week: 5.0 standard drinks    Types: 5 Cans of beer per week  . Drug use: No  . Sexual activity: Yes    Birth control/protection: Pill  Other Topics Concern  . Not on file  Social History Narrative  . Not on file   Social Determinants of Health   Financial Resource Strain:   . Difficulty of Paying Living Expenses:   Food Insecurity:   . Worried About  Charity fundraiser in the Last Year:   . Arboriculturist in the Last Year:   Transportation Needs:   . Film/video editor (Medical):   Marland Kitchen Lack of Transportation (Non-Medical):   Physical Activity:   . Days of Exercise per Week:   . Minutes of Exercise per Session:   Stress:   . Feeling of Stress :   Social Connections:   . Frequency of Communication with Friends and Family:   . Frequency of Social Gatherings with Friends and Family:   . Attends Religious Services:   . Active Member of Clubs or Organizations:   . Attends Archivist Meetings:   Marland Kitchen Marital Status:   Intimate Partner Violence:   . Fear of Current or Ex-Partner:   . Emotionally Abused:   Marland Kitchen Physically Abused:   . Sexually Abused:     Family History:  Family History  Problem Relation Age of Onset  .  Pulmonary fibrosis Father   . Colon cancer Paternal Aunt   . Uterine cancer Paternal Grandmother        with mets to the colon   . Heart failure Maternal Grandmother   . Heart disease Paternal Uncle   . Heart disease Paternal Aunt   . Kidney disease Cousin   . Hyperlipidemia Mother   . Mitral valve prolapse Mother   . Heart disease Mother        arrhythmia    Medications:   Current Outpatient Medications on File Prior to Visit  Medication Sig Dispense Refill  . acetaminophen (TYLENOL) 325 MG tablet Take 2 tablets (650 mg total) by mouth every 4 (four) hours as needed for mild pain (or temp > 37.5 C (99.5 F)). (Patient taking differently: Take 650 mg by mouth as needed for mild pain (or temp > 37.5 C (99.5 F)). )    . albuterol (PROVENTIL HFA;VENTOLIN HFA) 108 (90 Base) MCG/ACT inhaler Inhale 2 puffs into the lungs every 4 (four) hours as needed for wheezing or shortness of breath (cough, shortness of breath or wheezing.). 1 Inhaler 1  . atorvastatin (LIPITOR) 80 MG tablet TAKE 1 TABLET (80 MG TOTAL) BY MOUTH DAILY AT 6 PM. 90 tablet 1  . buPROPion (WELLBUTRIN XL) 300 MG 24 hr tablet Take 1 tablet (300  mg total) by mouth daily. 90 tablet 3  . citalopram (CELEXA) 20 MG tablet Take 1 tablet (20 mg total) by mouth daily. 90 tablet 3  . colestipol (COLESTID) 1 g tablet Take 2 tablets (2 g total) by mouth daily. 30 tablet 2  . fexofenadine (ALLEGRA) 180 MG tablet Take 180 mg by mouth daily as needed.     . loperamide (IMODIUM) 2 MG capsule Take 1 capsule (2 mg total) by mouth as needed for diarrhea or loose stools. 30 capsule 0  . mesalamine (LIALDA) 1.2 g EC tablet Take 1 tablet (1.2 g total) by mouth daily with breakfast. Keep appointment on 11/22/19 20 tablet 0  . methocarbamol (ROBAXIN) 500 MG tablet Take 2 tablets (1,000 mg total) by mouth 2 (two) times daily as needed for muscle spasms. 120 tablet 2  . pantoprazole (PROTONIX) 40 MG tablet Take 1 tablet (40 mg total) by mouth daily. 90 tablet 3  . Rivaroxaban 15 & 20 MG TBPK Follow package directions: Take one 71m tablet by mouth twice a day. On day 22, switch to one 248mtablet once a day. Take with food. 51 each 0  . saccharomyces boulardii (FLORASTOR) 250 MG capsule Take 1 capsule (250 mg total) by mouth 2 (two) times daily. 60 capsule 0  . zolpidem (AMBIEN CR) 12.5 MG CR tablet TAKE 1 TABLET (12.5 MG TOTAL) BY MOUTH AT BEDTIME AS NEEDED FOR SLEEP 30 tablet 3   No current facility-administered medications on file prior to visit.    Allergies:   Allergies  Allergen Reactions  . Sertraline Hcl     REACTION: diarrhea  . Zoloft [Sertraline Hcl] Diarrhea     Physical Exam  Vitals:   11/27/19 1010 11/27/19 1055  BP: (!) 134/94 126/86  Pulse: 81   Temp: 97.6 F (36.4 C)   Weight: 209 lb (94.8 kg)   Height: 5' 2"  (1.575 m)    Body mass index is 38.23 kg/m. No exam data present  General: well developed, well nourished, pleasant middle-age Caucasian female, seated, in no evident distress Head: head normocephalic and atraumatic.   Neck: supple with no carotid or supraclavicular bruits  Cardiovascular: regular rate and rhythm, no  murmurs Musculoskeletal: no deformity Skin:  no rash/petichiae Vascular:  Normal pulses all extremities   Neurologic Exam Mental Status: Awake and fully alert.   Normal speech and language.  Oriented to place and time. Recent and remote memory intact. Attention span, concentration and fund of knowledge appropriate. Mood and affect appropriate.  Cranial Nerves: Fundoscopic exam reveals sharp disc margins. Pupils equal, briskly reactive to light. Extraocular movements full without nystagmus. Visual fields full to confrontation. Hearing intact. Facial sensation intact. Face, tongue, palate moves normally and symmetrically.  Motor: Normal bulk and tone. Normal strength in all tested extremity muscles except slightly decreased left hand dexterity. Sensory.: intact to touch , pinprick , position and vibratory sensation.  Coordination: Rapid alternating movements normal in all extremities except slightly decreased left hand. Finger-to-nose and heel-to-shin performed accurately bilaterally. Gait and Station: Arises from chair without difficulty. Stance is normal. Gait demonstrates  mild hemiplegic and steppage gait Reflexes: 1+ and symmetric. Toes downgoing.       ASSESSMENT: Carol Johnson is a 54 y.o. year old female presented with left-sided weakness on 05/23/2019 with stroke work-up showing right posterior corona radiata/PICA infarct and AchA territory infarct likely secondary to small vessel disease source but recommended 30 cardiac event monitor due to AchA territory infarct to rule out atrial fibrillation which was negative.  During stroke work-up, imaging showed evidence of right ACA 2 mm aneurysm.  Vascular risk factors include HTN, HLD, history of DVT, history of tobacco use, alcohol use and obesity.  Residual deficits of mild decrease left hand dexterity and gait impairment as well as poststroke vs tension headaches     PLAN:  1. Ischemic stroke:  -Continue Xarelto (rivaroxaban) daily   and atorvastatin 80 mg daily for secondary stroke prevention.  Advised to discontinue aspirin as not indicated while on Xarelto therapy for acute DVT.  Advised to restart aspirin once Xarelto therapy completed.  -Maintain strict control of hypertension with blood pressure goal below 130/90, diabetes with hemoglobin A1c goal below 6.5% and cholesterol with LDL cholesterol (bad cholesterol) goal below 70 mg/dL.  I also advised the patient to eat a healthy diet with plenty of whole grains, cereals, fruits and vegetables, exercise regularly with at least 30 minutes of continuous activity daily and maintain ideal body weight.  2. HTN: Stable.  Ongoing management by PCP 3. HLD: Stable.  Ongoing management by PCP 4. Headaches: Likely tension vascular headache.  Less likely migraine related.  Recommend initiating Topamax 25 mg twice daily and importance of stress reduction techniques.  Advised to decrease use of Tylenol due to concern of rebound headache.  Negative for concerning symptoms or red flags therefore no further work-up indicated at this time. 5. Right ACA 2 mm aneurysm: Repeat MRA head for surveillance monitoring 6. Acute DVT, right femoral: Recently found to have lower extremity ultrasound.  Continuation of Xarelto as instructed by PCP with ongoing monitoring and management    Follow up in 3 months or call earlier if needed   I spent 33 minutes of face-to-face and non-face-to-face time with patient.  This included previsit chart review, lab review, study review, order entry, electronic health record documentation, patient education regarding history of stroke, ongoing headaches, risk versus benefit of initiating Topamax, incidental finding of aneurysm and recently diagnosed DVT and answered all questions to patient satisfaction     Frann Rider, [facility]  [facility] [address] [place], [place] [address]  Phone [phone]  Fax [phone] Note: This  document was prepared with digital dictation and possible smart phrase technology. Any transcriptional errors that result from this process are unintentional.

## 2019-11-27 NOTE — Patient Instructions (Addendum)
Start topamax 31m twice daily for headaches  Repeat MRA head for monitoring of aneurysm - you will be called to schedule imaging   Continue Xarelto (rivaroxaban) daily  and lipitor  for secondary stroke prevention  Discontinue aspirin while on Xarelto   Continue to follow up with PCP regarding cholesterol and blood pressure management   Maintain strict control of hypertension with blood pressure goal below 130/90, diabetes with hemoglobin A1c goal below 6.5% and cholesterol with LDL cholesterol (bad cholesterol) goal below 70 mg/dL. I also advised the patient to eat a healthy diet with plenty of whole grains, cereals, fruits and vegetables, exercise regularly and maintain ideal body weight.  Followup in the future with me in 3 months or call earlier if needed       [facility] you for [facility]. I hope we have been able to provide you high quality care today.  You may receive a patient satisfaction survey over the next few weeks. We would appreciate your feedback and comments so that we may continue to improve ourselves and the health of our patients.    Topiramate tablets What is this medicine? TOPIRAMATE (toe PYRE a mate) is used to treat seizures in adults or children with epilepsy. It is also used for the prevention of migraine headaches. This medicine may be used for other purposes; ask your health care provider or pharmacist if you have questions. COMMON BRAND NAME(S): Topamax, Topiragen What should I tell my health care provider before I take this medicine? They need to know if you have any of these conditions:  bleeding disorders  kidney disease  lung or breathing disease, like asthma  suicidal thoughts, plans, or attempt; a previous suicide attempt by you or a family member  an unusual or allergic reaction to topiramate, other medicines, foods, dyes, or preservatives  pregnant or trying to get pregnant  breast-feeding How  should I use this medicine? Take this medicine by mouth with a glass of water. Follow the directions on the prescription label. Do not cut, crush or chew this medicine. Swallow the tablets whole. You can take it with or without food. If it upsets your stomach, take it with food. Take your medicine at regular intervals. Do not take it more often than directed. Do not stop taking except on your doctor's advice. A special MedGuide will be given to you by the pharmacist with each prescription and refill. Be sure to read this information carefully each time. Talk to your pediatrician regarding the use of this medicine in children. While this drug may be prescribed for children as young as 275years of age for selected conditions, precautions do apply. Overdosage: If you think you have taken too much of this medicine contact a poison control center or emergency room at once. NOTE: This medicine is only for you. Do not share this medicine with others. What if I miss a dose? If you miss a dose, take it as soon as you can. If your next dose is to be taken in less than 6 hours, then do not take the missed dose. Take the next dose at your regular time. Do not take double or extra doses. What may interact with this medicine? This medicine may interact with the following medications:  acetazolamide  alcohol  antihistamines for allergy, cough, and cold  aspirin and aspirin-like medicines  atropine  birth control pills  certain medicines for anxiety or sleep  certain medicines for bladder problems  like oxybutynin, tolterodine  certain medicines for depression like amitriptyline, fluoxetine, sertraline  certain medicines for seizures like carbamazepine, phenobarbital, phenytoin, primidone, valproic acid, zonisamide  certain medicines for stomach problems like dicyclomine, hyoscyamine  certain medicines for travel sickness like scopolamine  certain medicines for Parkinson's disease like benztropine,  trihexyphenidyl  certain medicines that treat or prevent blood clots like warfarin, enoxaparin, dalteparin, apixaban, dabigatran, and rivaroxaban  digoxin  general anesthetics like halothane, isoflurane, methoxyflurane, propofol  hydrochlorothiazide  ipratropium  lithium  medicines that relax muscles for surgery  metformin  narcotic medicines for pain  NSAIDs, medicines for pain and inflammation, like ibuprofen or naproxen  phenothiazines like chlorpromazine, mesoridazine, prochlorperazine, thioridazine  pioglitazone This list may not describe all possible interactions. Give your health care provider a list of all the medicines, herbs, non-prescription drugs, or dietary supplements you use. Also tell them if you smoke, drink alcohol, or use illegal drugs. Some items may interact with your medicine. What should I watch for while using this medicine? Visit your doctor or health care professional for regular checks on your progress. Tell your health care professional if your symptoms do not start to get better or if they get worse. Do not stop taking except on your health care professional's advice. You may develop a severe reaction. Your health care professional will tell you how much medicine to take. Wear a medical ID bracelet or chain. Carry a card that describes your disease and details of your medicine and dosage times. This medicine can reduce the response of your body to heat or cold. Dress warm in cold weather and stay hydrated in hot weather. If possible, avoid extreme temperatures like saunas, hot tubs, very hot or cold showers, or activities that can cause dehydration such as vigorous exercise. Check with your health care professional if you have severe diarrhea, nausea, and vomiting, or if you sweat a lot. The loss of too much body fluid may make it dangerous for you to take this medicine. You may get drowsy or dizzy. Do not drive, use machinery, or do anything that needs  mental alertness until you know how this medicine affects you. Do not stand up or sit up quickly, especially if you are an older patient. This reduces the risk of dizzy or fainting spells. Alcohol may interfere with the effect of this medicine. Avoid alcoholic drinks. Tell your health care professional right away if you have any change in your eyesight. Patients and their families should watch out for new or worsening depression or thoughts of suicide. Also watch out for sudden changes in feelings such as feeling anxious, agitated, panicky, irritable, hostile, aggressive, impulsive, severely restless, overly excited and hyperactive, or not being able to sleep. If this happens, especially at the beginning of treatment or after a change in dose, call your healthcare professional. This medicine may cause serious skin reactions. They can happen weeks to months after starting the medicine. Contact your health care provider right away if you notice fevers or flu-like symptoms with a rash. The rash may be red or purple and then turn into blisters or peeling of the skin. Or, you might notice a red rash with swelling of the face, lips or lymph nodes in your neck or under your arms. Birth control may not work properly while you are taking this medicine. Talk to your health care professional about using an extra method of birth control. Women should inform their health care professional if they wish to become pregnant or think  they might be pregnant. There is a potential for serious side effects and harm to an unborn child. Talk to your health care professional for more information. What side effects may I notice from receiving this medicine? Side effects that you should report to your doctor or health care professional as soon as possible:  allergic reactions like skin rash, itching or hives, swelling of the face, lips, or tongue  blood in the urine  changes in vision  confusion  loss of memory  pain in  lower back or side  pain when urinating  redness, blistering, peeling or loosening of the skin, including inside the mouth  signs and symptoms of bleeding such as bloody or black, tarry stools; red or dark brown urine; spitting up blood or brown material that looks like coffee grounds; red spots on the skin; unusual bruising or bleeding from the eyes, gums, or nose  signs and symptoms of increased acid in the body like breathing fast; fast heartbeat; headache; confusion; unusually weak or tired; nausea, vomiting  suicidal thoughts, mood changes  trouble speaking or understanding  unusual sweating  unusually weak or tired Side effects that usually do not require medical attention (report to your doctor or health care professional if they continue or are bothersome):  dizziness  drowsiness  fever  loss of appetite  nausea, vomiting  pain, tingling, numbness in the hands or feet  stomach pain  tiredness  upset stomach This list may not describe all possible side effects. Call your doctor for medical advice about side effects. You may report side effects to FDA at 1-800-FDA-1088. Where should I keep my medicine? Keep out of the reach of children. Store at room temperature between 15 and 30 degrees C (59 and 86 degrees F). Throw away any unused medicine after the expiration date. NOTE: This sheet is a summary. It may not cover all possible information. If you have questions about this medicine, talk to your doctor, pharmacist, or health care provider.  2020 Elsevier/Gold Standard (2019-03-15 15:07:20)

## 2019-11-27 NOTE — Telephone Encounter (Signed)
[employer] order sent to GI. They will obtain the auth and reach out to the patient to schedule.  

## 2019-11-30 NOTE — Progress Notes (Signed)
I agree with the above plan 

## 2019-12-03 ENCOUNTER — Other Ambulatory Visit: Payer: Self-pay

## 2019-12-03 ENCOUNTER — Ambulatory Visit: Payer: 59 | Admitting: Family Medicine

## 2019-12-03 ENCOUNTER — Encounter: Payer: Self-pay | Admitting: Family Medicine

## 2019-12-03 VITALS — BP 126/74 | HR 83 | Temp 97.3°F | Ht 62.0 in | Wt 208.4 lb

## 2019-12-03 DIAGNOSIS — I10 Essential (primary) hypertension: Secondary | ICD-10-CM | POA: Diagnosis not present

## 2019-12-03 DIAGNOSIS — Z8673 Personal history of transient ischemic attack (TIA), and cerebral infarction without residual deficits: Secondary | ICD-10-CM

## 2019-12-03 DIAGNOSIS — Z86718 Personal history of other venous thrombosis and embolism: Secondary | ICD-10-CM | POA: Diagnosis not present

## 2019-12-03 DIAGNOSIS — L659 Nonscarring hair loss, unspecified: Secondary | ICD-10-CM | POA: Diagnosis not present

## 2019-12-03 DIAGNOSIS — I671 Cerebral aneurysm, nonruptured: Secondary | ICD-10-CM

## 2019-12-03 DIAGNOSIS — E669 Obesity, unspecified: Secondary | ICD-10-CM

## 2019-12-03 DIAGNOSIS — D473 Essential (hemorrhagic) thrombocythemia: Secondary | ICD-10-CM

## 2019-12-03 DIAGNOSIS — D75839 Thrombocytosis, unspecified: Secondary | ICD-10-CM

## 2019-12-03 DIAGNOSIS — M79661 Pain in right lower leg: Secondary | ICD-10-CM

## 2019-12-03 LAB — CBC WITH DIFFERENTIAL/PLATELET
Basophils Absolute: 0.1 10*3/uL (ref 0.0–0.1)
Basophils Relative: 0.9 % (ref 0.0–3.0)
Eosinophils Absolute: 0.6 10*3/uL (ref 0.0–0.7)
Eosinophils Relative: 6.8 % — ABNORMAL HIGH (ref 0.0–5.0)
HCT: 38.5 % (ref 36.0–46.0)
Hemoglobin: 12.5 g/dL (ref 12.0–15.0)
Lymphocytes Relative: 16.7 % (ref 12.0–46.0)
Lymphs Abs: 1.5 10*3/uL (ref 0.7–4.0)
MCHC: 32.5 g/dL (ref 30.0–36.0)
MCV: 80.9 fl (ref 78.0–100.0)
Monocytes Absolute: 0.5 10*3/uL (ref 0.1–1.0)
Monocytes Relative: 6.2 % (ref 3.0–12.0)
Neutro Abs: 6.1 10*3/uL (ref 1.4–7.7)
Neutrophils Relative %: 69.4 % (ref 43.0–77.0)
Platelets: 379 10*3/uL (ref 150.0–400.0)
RBC: 4.76 Mil/uL (ref 3.87–5.11)
RDW: 15.7 % — ABNORMAL HIGH (ref 11.5–15.5)
WBC: 8.8 10*3/uL (ref 4.0–10.5)

## 2019-12-03 LAB — TSH: TSH: 1.81 u[IU]/mL (ref 0.35–4.50)

## 2019-12-03 MED ORDER — RIVAROXABAN 20 MG PO TABS: 20.0000 mg | ORAL_TABLET | Freq: Every day | ORAL | 3 refills | Status: DC

## 2019-12-03 NOTE — Assessment & Plan Note (Signed)
Cbc today  Pt has suffered a 2nd DVT Now on xarelto

## 2019-12-03 NOTE — Assessment & Plan Note (Signed)
R leg in 2016 (tx with 6 mo of anticoag) in setting of smoking and hormone This episode (now in L leg) is unprovoked  Give re occurrence and also unprovoked nature-will likely need lifelong tx  Coagulation labs done today

## 2019-12-03 NOTE — Assessment & Plan Note (Signed)
bp in fair control at this time  BP Readings from Last 1 Encounters:  12/03/19 126/74   No changes needed Most recent labs reviewed  Disc lifstyle change with low sodium diet and exercise

## 2019-12-03 NOTE — Assessment & Plan Note (Signed)
Discussed how this problem influences overall health and the risks it imposes  Reviewed plan for weight loss with lower calorie diet (via better food choices and also portion control or program like weight watchers) and exercise building up to or more than 30 minutes 5 days per week including some aerobic activity    

## 2019-12-03 NOTE — Assessment & Plan Note (Signed)
This is new  No known female patterned loss in family  A lot of stress over last year  TSH done today

## 2019-12-03 NOTE — Assessment & Plan Note (Signed)
Pt still notes L sided weakness with some gradual improvement  Currently on xarelto for DVT Taking statin and bp is therapeutic

## 2019-12-03 NOTE — Patient Instructions (Signed)
Continue current medicines - proceed to the maintenance dose of xarelto   When done with starter pack-I sent in px for continuing dose  If any new leg symptoms let us know  Labs today- for coagulation factors and thyroid and blood count   Take care of yourself

## 2019-12-03 NOTE — Progress Notes (Signed)
Subjective:    Patient ID: Carol Johnson, female    DOB: 1966/04/19, 54 y.o.   MRN: [id number]  This visit occurred during the SARS-CoV-2 public health emergency.  Safety protocols were in place, including screening questions prior to the visit, additional usage of staff PPE, and extensive cleaning of exam room while observing appropriate contact time as indicated for disinfecting solutions.    HPI Pt presents for f/u of DVT in L leg   Wt Readings from Last 3 Encounters:  12/03/19 208 lb 6 oz (94.5 kg)  11/27/19 209 lb (94.8 kg)  11/22/19 209 lb (94.8 kg)   38.11 kg/m    She saw Dr Darnell Level 11/13/19-diagnosed L DVT (oddly after presenting with R not L leg pain) but no swelling  Noted hx of R DVT in 2016 tx with 6 mo of xarelto   Also CVA 9/20 with aneurysm burst   She is a former smoker   Was told by neurology to stop aspirin since now on xarelto   Venous LE [place]: US Venous Img Lower Bilateral (Accession [id number]) (Order [id number]) Imaging Date: 11/13/2019 Department: [facility] Released By: Cristal Generous Authorizing: Ria Bush, MD  Exam Status  Status  Final [99]  PACS Intelerad Image Link  Show images for US Venous Img Lower Bilateral  Study Result  CLINICAL DATA:  RIGHT lower extremity pain, RIGHT calf and posterior thigh pain, history DVT  EXAM: BILATERAL LOWER EXTREMITY VENOUS DOPPLER ULTRASOUND  TECHNIQUE: Gray-scale sonography with compression, as well as color and duplex ultrasound, were performed to evaluate the deep venous system(s) from the level of the common femoral vein through the popliteal and proximal calf veins.  COMPARISON:  None  FINDINGS: VENOUS  Normal compressibility of the common femoral, superficial femoral, and popliteal veins, as well as the visualized calf veins in the RIGHT lower extremity. Visualized portions of profunda femoral vein and great saphenous vein unremarkable in RIGHT  lower extremity. No filling defects to suggest DVT on grayscale or color Doppler imaging. Doppler waveforms show normal direction of venous flow, normal respiratory phasicity and response to augmentation.  Limited views of the contralateral common femoral vein are unremarkable.  On the LEFT, nonocclusive thrombus identified in the mid LEFT femoral vein. Venous flow is diminished within the segment and this segment is noncompressible. Remaining deep venous system of the LEFT lower extremity is patent and compressible, including common femoral, remaining superficial femoral and popliteal veins. Doppler waveforms in remaining segments are unremarkable.  OTHER  None.  Limitations: none  IMPRESSION: Segmental nonocclusive deep venous thrombosis in the mid LEFT femoral vein.  Remaining deep venous systems of the lower extremities are patent and compressible bilaterally.  These results will be called to the ordering clinician or representative by the Radiologist Assistant, and communication documented in the PACS or [employer].   Electronically Signed   By: Lavonia Dana M.D.   On: 11/13/2019 17:58   Pt was started on a xarelto starter pack - will finish soon and adv to 10 mg daily  Tolerating it well   No smoking and no hormone tx   fam hx MGM had several strokes and CHF  No fam h/o DVT   She is not having any problems with xarelto  Past h/o thrombocytosis (not at last draw)  Baseline anemia  Lab Results  Component Value Date   WBC 7.1 05/26/2019   HGB 11.5 (L) 05/26/2019   HCT 36.1 05/26/2019   MCV  81.7 05/26/2019   PLT 335 05/26/2019     She has MRI scheduled 4/28 from neurology for R sided headaches  Her GI px topamax = has not taken any yet   Has noted hair loss all over head  No focal areas  Notes on her brush and bathroom floor This is worrisome to her  No recent wt change but has had significant stress in the past year   Lab Results    Component Value Date   TSH 2.67 04/06/2019   is menopausal since about 2018   In general she has felt a little tired/achey   Leg pain is improved today-notne at all   Baseline weakness in L hand- cannot lift as well / L leg is also a bit weaker Is walking w/o a cane (from prior stroke)  Patient Active Problem List   Diagnosis Date Noted  . Hair loss 12/03/2019  . Right calf pain 11/13/2019  . Abnormality of gait 09/04/2019  . Hemiparesis affecting nondominant side as late effect of stroke ([place]) 07/24/2019  . Muscle weakness (generalized) 06/11/2019  . Hemiplegia and hemiparesis following cerebral infarction affecting left non-dominant side ([place]) 06/11/2019  . Major depressive disorder   . Hypokalemia   . Acute blood loss anemia   . Ulcerative colitis with complication ([place])   . H/O: CVA (cerebrovascular accident) 05/23/2019  . Thrombocytosis ([place]) 05/23/2019  . Coronary artery disease involving native coronary artery of native heart without angina pectoris 03/21/2018  . Coronary artery calcification 02/10/2018  . Obesity (BMI 30-39.9) 02/10/2018  . Former smoker 01/15/2018  . Dyspnea on exertion 12/21/2017  . Carpal tunnel syndrome 12/21/2017  . Stress incontinence 12/21/2017  . Paresthesia of both feet 07/04/2017  . Always thirsty 07/04/2017  . Abdominal bloating 12/01/2015  . Fatigue 12/01/2015  . Vitamin B12 deficiency 12/01/2015  . Lymphocytic colitis 01/16/2015  . Anemia, iron deficiency 01/16/2015  . Anxiety state 12/20/2014  . Diarrhea 11/27/2014  . Insomnia 11/27/2014  . History of DVT (deep vein thrombosis) 09/17/2014  . OSA (obstructive sleep apnea) 09/02/2014  . Gastritis, chronic 06/06/2014  . Unspecified gastritis and gastroduodenitis without mention of hemorrhage 05/03/2014  . Abdominal pain, epigastric 04/19/2014  . Internal hemorrhoids with other complication 19/50/9326  . Routine general medical examination at a health care facility 10/14/2011  .  Thyroid nodule 05/16/2011  . Rectal bleeding 11/24/2010  . Hemorrhoids, internal 11/24/2010  . Irritable bowel syndrome (IBS) 11/24/2010  . ANEMIA 10/02/2009  . Hyperlipidemia 07/18/2009  . Essential hypertension 07/18/2009  . DYSPNEA ON EXERTION 07/18/2009  . THYROID CYST 01/31/2008  . ANXIETY 01/31/2008  . Depression with anxiety 01/31/2008  . GASTRIC ULCER, HX OF 01/31/2008  . FIBROIDS, UTERUS 11/13/2007  . ALLERGIC RHINITIS 11/13/2007  . ASTHMA 11/13/2007  . GERD 11/13/2007  . IRRITABLE BOWEL SYNDROME 11/13/2007  . INTERNAL HEMORRHOIDS 11/12/2003   Past Medical History:  Diagnosis Date  . Abdominal pain, unspecified site   . Allergic rhinitis   . Anxiety   . Asthma   . Depression   . DVT (deep venous thrombosis) ([place]) 2016  . Fibroid uterus   . GERD (gastroesophageal reflux disease)   . Hypercholesterolemia   . Hypertension   . Internal hemorrhoid   . Irritable bowel syndrome   . Lymphocytic colitis 01/02/2015  . Pneumonia 1998  . Stroke ([place])   . Ulcerative colitis ([place])   . URI (upper respiratory infection)   . Uterine fibroid   . Vitamin B12 deficiency  Past Surgical History:  Procedure Laterality Date  . CESAREAN SECTION  2004  . CHOLECYSTECTOMY     laparoscopic "gallstones"  . ESOPHAGOGASTRODUODENOSCOPY (EGD) WITH PROPOFOL N/A 05/03/2014   Procedure: ESOPHAGOGASTRODUODENOSCOPY (EGD) WITH PROPOFOL;  Surgeon: Inda Castle, MD;  Location: [facility];  Service: Endoscopy;  Laterality: N/A;  . MYOMECTOMY    . UTERINE FIBROID SURGERY     Social History   Tobacco Use  . Smoking status: Former Smoker    Packs/day: 0.10    Years: 10.00    Pack years: 1.00    Types: Cigarettes    Quit date: 12/07/2017    Years since quitting: 1.9  . Smokeless tobacco: Never Used  Substance Use Topics  . Alcohol use: Not Currently    Alcohol/week: 5.0 standard drinks    Types: 5 Cans of beer per week  . Drug use: No   Family History  Problem Relation Age of Onset   . Pulmonary fibrosis Father   . Colon cancer Paternal Aunt   . Uterine cancer Paternal Grandmother        with mets to the colon   . Heart failure Maternal Grandmother   . Heart disease Paternal Uncle   . Heart disease Paternal Aunt   . Kidney disease Cousin   . Hyperlipidemia Mother   . Mitral valve prolapse Mother   . Heart disease Mother        arrhythmia   Allergies  Allergen Reactions  . Sertraline Hcl     REACTION: diarrhea  . Zoloft [Sertraline Hcl] Diarrhea   Current Outpatient Medications on File Prior to Visit  Medication Sig Dispense Refill  . acetaminophen (TYLENOL) 325 MG tablet Take 2 tablets (650 mg total) by mouth every 4 (four) hours as needed for mild pain (or temp > 37.5 C (99.5 F)). (Patient taking differently: Take 650 mg by mouth as needed for mild pain (or temp > 37.5 C (99.5 F)). )    . albuterol (PROVENTIL HFA;VENTOLIN HFA) 108 (90 Base) MCG/ACT inhaler Inhale 2 puffs into the lungs every 4 (four) hours as needed for wheezing or shortness of breath (cough, shortness of breath or wheezing.). 1 Inhaler 1  . atorvastatin (LIPITOR) 80 MG tablet TAKE 1 TABLET (80 MG TOTAL) BY MOUTH DAILY AT 6 PM. 90 tablet 1  . buPROPion (WELLBUTRIN XL) 300 MG 24 hr tablet Take 1 tablet (300 mg total) by mouth daily. 90 tablet 3  . citalopram (CELEXA) 20 MG tablet Take 1 tablet (20 mg total) by mouth daily. 90 tablet 3  . colestipol (COLESTID) 1 g tablet Take 2 tablets (2 g total) by mouth daily. 30 tablet 2  . fexofenadine (ALLEGRA) 180 MG tablet Take 180 mg by mouth daily as needed.     . loperamide (IMODIUM) 2 MG capsule Take 1 capsule (2 mg total) by mouth as needed for diarrhea or loose stools. 30 capsule 0  . mesalamine (LIALDA) 1.2 g EC tablet Take 1 tablet (1.2 g total) by mouth daily with breakfast. Keep appointment on 11/22/19 20 tablet 0  . methocarbamol (ROBAXIN) 500 MG tablet Take 2 tablets (1,000 mg total) by mouth 2 (two) times daily as needed for muscle spasms. 120  tablet 2  . pantoprazole (PROTONIX) 40 MG tablet Take 1 tablet (40 mg total) by mouth daily. 90 tablet 3  . Rivaroxaban 15 & 20 MG TBPK Follow package directions: Take one 83m tablet by mouth twice a day. On day 22, switch to one 254m  tablet once a day. Take with food. 51 each 0  . saccharomyces boulardii (FLORASTOR) 250 MG capsule Take 1 capsule (250 mg total) by mouth 2 (two) times daily. 60 capsule 0  . topiramate (TOPAMAX) 25 MG tablet Take 1 tablet (25 mg total) by mouth 2 (two) times daily. 180 tablet 3  . zolpidem (AMBIEN CR) 12.5 MG CR tablet TAKE 1 TABLET (12.5 MG TOTAL) BY MOUTH AT BEDTIME AS NEEDED FOR SLEEP 30 tablet 3   No current facility-administered medications on file prior to visit.     Review of Systems  Constitutional: Negative for activity change, appetite change, fatigue, fever and unexpected weight change.  HENT: Negative for congestion, ear pain, rhinorrhea, sinus pressure and sore throat.   Eyes: Negative for pain, redness and visual disturbance.  Respiratory: Negative for cough, shortness of breath and wheezing.   Cardiovascular: Negative for chest pain and palpitations.  Gastrointestinal: Negative for abdominal pain, blood in stool, constipation and diarrhea.  Endocrine: Negative for polydipsia and polyuria.  Genitourinary: Negative for dysuria, frequency and urgency.  Musculoskeletal: Negative for arthralgias, back pain and myalgias.  Skin: Negative for pallor and rash.  Allergic/Immunologic: Negative for environmental allergies.  Neurological: Positive for weakness and headaches. Negative for dizziness and syncope.  Hematological: Negative for adenopathy. Does not bruise/bleed easily.  Psychiatric/Behavioral: Negative for decreased concentration and dysphoric mood. The patient is not nervous/anxious.        Objective:   Physical Exam Constitutional:      General: She is not in acute distress.    Appearance: Normal appearance. She is well-developed. She is  obese. She is not ill-appearing or diaphoretic.  HENT:     Head: Normocephalic and atraumatic.  Eyes:     General: No scleral icterus.    Conjunctiva/sclera: Conjunctivae normal.     Pupils: Pupils are equal, round, and reactive to light.  Neck:     Thyroid: No thyromegaly.     Vascular: No carotid bruit or JVD.  Cardiovascular:     Rate and Rhythm: Normal rate and regular rhythm.     Pulses: Normal pulses.     Heart sounds: Normal heart sounds. No murmur. No gallop.   Pulmonary:     Effort: Pulmonary effort is normal. No respiratory distress.     Breath sounds: Normal breath sounds. No wheezing or rales.  Abdominal:     General: Bowel sounds are normal. There is no distension or abdominal bruit.     Palpations: Abdomen is soft. There is no mass.     Tenderness: There is no abdominal tenderness.  Musculoskeletal:        General: No tenderness.     Cervical back: Normal range of motion and neck supple.     Right lower leg: No edema.     Left lower leg: No edema.     Comments: No LE edema/tenderness /warmth or palp cords Neg homan's sign bilaterally  Lymphadenopathy:     Cervical: No cervical adenopathy.  Skin:    General: Skin is warm and dry.     Coloration: Skin is not pale.     Findings: No erythema or rash.     Comments: No focal alopecia noted  Some shedding of hairs on clothing/back  Follicles appear normal   Neurological:     Mental Status: She is alert.     Sensory: No sensory deficit.     Motor: Weakness present.     Coordination: Coordination normal.     Deep  Tendon Reflexes: Reflexes are normal and symmetric. Reflexes normal.  Psychiatric:        Mood and Affect: Mood normal.           Assessment & Plan:   Problem List Items Addressed This Visit      Cardiovascular and Mediastinum   Essential hypertension    bp in fair control at this time  BP Readings from Last 1 Encounters:  12/03/19 126/74   No changes needed Most recent labs reviewed  Disc  lifstyle change with low sodium diet and exercise        Relevant Medications   rivaroxaban (XARELTO) 20 MG TABS tablet   Other Relevant Orders   TSH (Completed)   CBC with Differential/Platelet (Completed)     Hematopoietic and Hemostatic   Thrombocytosis ([place])    Cbc today  Pt has suffered a 2nd DVT Now on xarelto        Other   History of DVT (deep vein thrombosis) - Primary    R leg in 2016 (tx with 6 mo of anticoag) in setting of smoking and hormone This episode (now in L leg) is unprovoked  Give re occurrence and also unprovoked nature-will likely need lifelong tx  Coagulation labs done today       Relevant Orders   Recurrent Miscarriage Eval/Coag Pnl   CBC with Differential/Platelet (Completed)   Obesity (BMI 30-39.9)    Discussed how this problem influences overall health and the risks it imposes  Reviewed plan for weight loss with lower calorie diet (via better food choices and also portion control or program like weight watchers) and exercise building up to or more than 30 minutes 5 days per week including some aerobic activity         H/O: CVA (cerebrovascular accident)    Pt still notes L sided weakness with some gradual improvement  Currently on xarelto for DVT Taking statin and bp is therapeutic       Hair loss    This is new  No known female patterned loss in family  A lot of stress over last year  TSH done today      Relevant Orders   TSH (Completed)   RESOLVED: Right calf pain    This is now resolved  Venous doppler was nl on this side (oddly had dvt in other leg)

## 2019-12-03 NOTE — Assessment & Plan Note (Signed)
This is now resolved  Venous doppler was nl on this side (oddly had dvt in other leg)

## 2019-12-04 ENCOUNTER — Ambulatory Visit: Payer: 59 | Attending: Internal Medicine

## 2019-12-04 DIAGNOSIS — Z23 Encounter for immunization: Secondary | ICD-10-CM

## 2019-12-04 NOTE — Progress Notes (Signed)
   Covid-19 Vaccination Clinic  Name:  Carol Johnson    MRN: [id number] DOB: 03-07-1966  12/04/2019  [name] was observed post Covid-19 immunization for 15 minutes without incident. She was provided with Vaccine Information Sheet and instruction to access the V-Safe system.   [name] was instructed to call 911 with any severe reactions post vaccine: Marland Kitchen Difficulty breathing  . Swelling of face and throat  . A fast heartbeat  . A bad rash all over body  . Dizziness and weakness   Immunizations Administered    Name Date Dose VIS Date Route   Pfizer COVID-19 Vaccine 12/04/2019  1:03 PM 0.3 mL 08/10/2019 Intramuscular   Manufacturer: [place]   Lot: [id number]   [place]: [address]-8

## 2019-12-06 ENCOUNTER — Other Ambulatory Visit: Payer: Self-pay | Admitting: Gastroenterology

## 2019-12-10 ENCOUNTER — Encounter: Payer: 59 | Attending: Registered Nurse | Admitting: Physical Medicine & Rehabilitation

## 2019-12-10 DIAGNOSIS — E7849 Other hyperlipidemia: Secondary | ICD-10-CM | POA: Insufficient documentation

## 2019-12-10 DIAGNOSIS — I639 Cerebral infarction, unspecified: Secondary | ICD-10-CM | POA: Insufficient documentation

## 2019-12-10 DIAGNOSIS — F5101 Primary insomnia: Secondary | ICD-10-CM | POA: Insufficient documentation

## 2019-12-10 DIAGNOSIS — M62838 Other muscle spasm: Secondary | ICD-10-CM | POA: Insufficient documentation

## 2019-12-11 LAB — RECURRENT MISCARRIAGE EVAL/COAG PNL
AntiThromb III Func: 127 % normal (ref 80–135)
Anticardiolipin IgA: <11 [APL'U] (ref <=11)
Anticardiolipin IgG: <14 [GPL'U] (ref <=14)
Anticardiolipin IgM: <12 [MPL'U] (ref <=12)
Beta-2 Glyco 1 IgA: <9 SAU (ref <=20)
Beta-2 Glyco 1 IgM: <9 SMU (ref <=20)
Beta-2 Glyco I IgG: <9 SGU (ref <=20)
Homocysteine: 7.7 umol/L (ref <10.4)
PHOSPHATIDYLSERINE AB  (IGG): <10 U/mL (ref <10)
PHOSPHATIDYLSERINE AB  (IGM): <25 U/mL (ref <25)
PTT-LA Screen: 52 s — ABNORMAL HIGH (ref <=40)
Protein C Activity: >200 % — ABNORMAL HIGH (ref 70–180)
Protein S Ag, Free: 84 % normal (ref 50–147)
dRVVT: 57 s — ABNORMAL HIGH (ref <=45)

## 2019-12-11 LAB — RFLX HEXAGONAL PHASE CONFIRM: Hexagonal Phase Conf: POSITIVE — AB

## 2019-12-11 LAB — RFLX DRVVT CONFRIM: DRVVT CONFIRM: NEGATIVE

## 2019-12-18 ENCOUNTER — Encounter: Payer: Self-pay | Admitting: Family Medicine

## 2019-12-18 DIAGNOSIS — D6859 Other primary thrombophilia: Secondary | ICD-10-CM | POA: Insufficient documentation

## 2019-12-25 ENCOUNTER — Other Ambulatory Visit: Payer: Self-pay | Admitting: Gastroenterology

## 2019-12-26 ENCOUNTER — Ambulatory Visit
Admission: RE | Admit: 2019-12-26 | Discharge: 2019-12-26 | Disposition: A | Discharge status: Home / Self Care | Payer: 59 | Source: Ambulatory Visit | Attending: Adult Health | Admitting: Adult Health

## 2019-12-26 ENCOUNTER — Encounter: Payer: Self-pay | Admitting: Adult Health

## 2019-12-26 ENCOUNTER — Other Ambulatory Visit: Payer: Self-pay

## 2020-01-10 ENCOUNTER — Encounter: Payer: Self-pay | Admitting: Family Medicine

## 2020-01-10 ENCOUNTER — Ambulatory Visit: Payer: 59 | Admitting: Family Medicine

## 2020-01-10 ENCOUNTER — Telehealth: Payer: 59 | Admitting: Family

## 2020-01-10 ENCOUNTER — Other Ambulatory Visit: Payer: Self-pay

## 2020-01-10 DIAGNOSIS — J069 Acute upper respiratory infection, unspecified: Secondary | ICD-10-CM | POA: Diagnosis not present

## 2020-01-10 DIAGNOSIS — Z86718 Personal history of other venous thrombosis and embolism: Secondary | ICD-10-CM

## 2020-01-10 DIAGNOSIS — J4540 Moderate persistent asthma, uncomplicated: Secondary | ICD-10-CM

## 2020-01-10 DIAGNOSIS — D6859 Other primary thrombophilia: Secondary | ICD-10-CM

## 2020-01-11 ENCOUNTER — Telehealth: Payer: 59 | Admitting: Nurse Practitioner

## 2020-01-11 DIAGNOSIS — R0989 Other specified symptoms and signs involving the circulatory and respiratory systems: Secondary | ICD-10-CM

## 2020-01-11 MED ORDER — BENZONATATE 100 MG PO CAPS: 100.0000 mg | ORAL_CAPSULE | Freq: Three times a day (TID) | ORAL | 0 refills | Status: DC | PRN

## 2020-01-11 MED ORDER — PREDNISONE 10 MG (21) PO TBPK: ORAL_TABLET | ORAL | 0 refills | Status: DC

## 2020-01-11 MED ORDER — FLUTICASONE PROPIONATE 50 MCG/ACT NA SUSP: 2.0000 | Freq: Every day | NASAL | 6 refills | Status: AC

## 2020-01-11 NOTE — Telephone Encounter (Signed)
Dr Glori Bickers please review. I see patient was in for an office visit yesterday 01/10/20 but was not seen due to her acute symptoms. I was not sure how to advise patient on this. Thank you

## 2020-01-11 NOTE — Telephone Encounter (Signed)
See other message also.

## 2020-01-11 NOTE — Progress Notes (Signed)
I am so sorry for the delay in your care!!  We are sorry you are not feeling well.  Here is how we plan to help!  Based on what you have shared with me, it looks like you may have a viral upper respiratory infection.  Upper respiratory infections are caused by a large number of viruses; however, rhinovirus is the most common cause.   Symptoms vary from person to person, with common symptoms including sore throat, cough, fatigue or lack of energy and feeling of general discomfort.  A low-grade fever of up to 100.4 may present, but is often uncommon.  Symptoms vary however, and are closely related to a person's age or underlying illnesses.  The most common symptoms associated with an upper respiratory infection are nasal discharge or congestion, cough, sneezing, headache and pressure in the ears and face.  These symptoms usually persist for about 3 to 10 days, but can last up to 2 weeks.  It is important to know that upper respiratory infections do not cause serious illness or complications in most cases.    Upper respiratory infections can be transmitted from person to person, with the most common method of transmission being a person's hands.  The virus is able to live on the skin and can infect other persons for up to 2 hours after direct contact.  Also, these can be transmitted when someone coughs or sneezes; thus, it is important to cover the mouth to reduce this risk.  To keep the spread of the illness at [place], good hand hygiene is very important.  This is an infection that is most likely caused by a virus. There are no specific treatments other than to help you with the symptoms until the infection runs its course.  We are sorry you are not feeling well.  Here is how we plan to help!   For nasal congestion, you may use an oral decongestants such as Mucinex D or if you have glaucoma or high blood pressure use plain Mucinex.  Saline nasal spray or nasal drops can help and can safely be used as often  as needed for congestion.  For your congestion, I have prescribed Fluticasone nasal spray one spray in each nostril twice a day  If you do not have a history of heart disease, hypertension, diabetes or thyroid disease, prostate/bladder issues or glaucoma, you may also use Sudafed to treat nasal congestion.  It is highly recommended that you consult with a pharmacist or your primary care physician to ensure this medication is safe for you to take.     If you have a cough, you may use cough suppressants such as Delsym and Robitussin.  If you have glaucoma or high blood pressure, you can also use Coricidin HBP.   For cough I have prescribed for you A prescription cough medication called Tessalon Perles 100 mg. You may take 1-2 capsules every 8 hours as needed for cough and I have also sent in a prednisone dose pack. This should help clear up some of the inflammation.   If you have a sore or scratchy throat, use a saltwater gargle-  to  teaspoon of salt dissolved in a 4-ounce to 8-ounce glass of warm water.  Gargle the solution for approximately 15-30 seconds and then spit.  It is important not to swallow the solution.  You can also use throat lozenges/cough drops and Chloraseptic spray to help with throat pain or discomfort.  Warm or cold liquids can also be helpful  in relieving throat pain.  For headache, pain or general discomfort, you can use Ibuprofen or Tylenol as directed.   Some authorities believe that zinc sprays or the use of Echinacea may shorten the course of your symptoms.   HOME CARE . Only take medications as instructed by your medical team. . Be sure to drink plenty of fluids. Water is fine as well as fruit juices, sodas and electrolyte beverages. You may want to stay away from caffeine or alcohol. If you are nauseated, try taking small sips of liquids. How do you know if you are getting enough fluid? Your urine should be a pale yellow or almost colorless. . Get rest. . Taking a  steamy shower or using a humidifier may help nasal congestion and ease sore throat pain. You can place a towel over your head and breathe in the steam from hot water coming from a faucet. . Using a saline nasal spray works much the same way. . Cough drops, hard candies and sore throat lozenges may ease your cough. . Avoid close contacts especially the very young and the elderly . Cover your mouth if you cough or sneeze . Always remember to wash your hands.   GET HELP RIGHT AWAY IF: . You develop worsening fever. . If your symptoms do not improve within 10 days . You develop yellow or green discharge from your nose over 3 days. . You have coughing fits . You develop a severe head ache or visual changes. . You develop shortness of breath, difficulty breathing or start having chest pain . Your symptoms persist after you have completed your treatment plan  MAKE SURE YOU   Understand these instructions.  Will watch your condition.  Will get help right away if you are not doing well or get worse.  Your e-visit answers were reviewed by a board certified advanced clinical practitioner to complete your personal care plan. Depending upon the condition, your plan could have included both over the counter or prescription medications. Please review your pharmacy choice. If there is a problem, you may call our nursing hot line at and have the prescription routed to another pharmacy. Your safety is important to [place]. If you have drug allergies check your prescription carefully.   You can use MyChart to ask questions about today's visit, request a non-urgent call back, or ask for a work or school excuse for 24 hours related to this e-Visit. If it has been greater than 24 hours you will need to follow up with your provider, or enter a new e-Visit to address those concerns. You will get an e-mail in the next two days asking about your experience.  I hope that your e-visit has been valuable and will speed  your recovery. Thank you for using e-visits.    Approximately 5 minutes was spent documenting and reviewing patient's chart.

## 2020-01-12 DIAGNOSIS — R0981 Nasal congestion: Secondary | ICD-10-CM | POA: Insufficient documentation

## 2020-01-12 NOTE — Telephone Encounter (Signed)
Order done Will send to [facility]

## 2020-01-14 NOTE — Progress Notes (Signed)
E-visit completed on 01/10/20, medications were prescribed and sent to patient's preferred pharmacy.

## 2020-01-15 ENCOUNTER — Encounter: Payer: Self-pay | Admitting: Family Medicine

## 2020-01-15 ENCOUNTER — Telehealth: Payer: Self-pay | Admitting: Hematology and Oncology

## 2020-01-15 NOTE — Telephone Encounter (Signed)
Received a new hem referral from Dr. Glori Bickers at [facility] for hx of dvt. Carol Johnson has been cld and scheduled to see Dr. Lorenso Courier on 6/7 at 9am. Pt aware to arrive 15 minutes early.

## 2020-01-22 ENCOUNTER — Telehealth: Payer: Self-pay | Admitting: Hematology and Oncology

## 2020-01-22 NOTE — Telephone Encounter (Signed)
Received a voicemail from Ms. Pettinato to reschedule her appt. I was unable to leave a vm when I cld because the pt's voicemail is full.

## 2020-01-23 ENCOUNTER — Telehealth: Payer: Self-pay | Admitting: Hematology and Oncology

## 2020-01-23 NOTE — Telephone Encounter (Signed)
Carol Johnson cld to reschedule her new pt appt w/Dr. Jenel Lucks to 6/14 at 9am.

## 2020-01-27 ENCOUNTER — Other Ambulatory Visit: Payer: Self-pay | Admitting: Family Medicine

## 2020-02-04 ENCOUNTER — Encounter: Payer: 59 | Admitting: Hematology and Oncology

## 2020-02-04 ENCOUNTER — Telehealth: Payer: Self-pay

## 2020-02-04 ENCOUNTER — Other Ambulatory Visit: Payer: 59

## 2020-02-04 NOTE — Telephone Encounter (Signed)
Pt notified of Dr. Marliss Coots comments and verbalized understanding

## 2020-02-04 NOTE — Telephone Encounter (Signed)
I think that would be helpful. It may sedate a little and cause dry mouth-so just be aware

## 2020-02-04 NOTE — Telephone Encounter (Signed)
Patient contacted the office and states that she is wondering if she can take Dramamine? She states that she went on a trip and had to take a train ride the other day, and she felt that she had some motion sickness, as well as nausea. She states she has to take a train ride back home tomorrow, and she is wondering she can take Dramamine prior to this trip to prevent feeling sick? Please advise.

## 2020-02-10 NOTE — Progress Notes (Signed)
[place] Telephone:([phone]) [phone]   Fax:([phone]) [place] NOTE  Patient Care Team: Tower, Wynelle Fanny, MD as PCP - General End, Harrell Gave, MD as PCP - Cardiology (Cardiology)  Hematological/Oncological History # Recurrent LLE DVT 1) 09/15/2014: patient underwent LE [place] which showed findings consistent with acute deep vein thrombosis involving the posterior tibial vein of the left lower extremity. Thought to be provoked by OCPs vs smoking. Completed 6 months of Xarelto therapy.  2) 05/23/2019: patient had a CVA, acute infarct involving right corona radiata/internal capsule and extending into the right basal ganglia 3) 11/13/2019: patient underwent LE [place] which showed segmental nonocclusive deep venous thrombosis in the mid LEFT femoral vein. Restarted on Xarelto therapy 4) 12/03/2019: APS workup performed, negative for Anticardiolipin, Beta-2-glycoprotein. Lupus anticoagulant detected, though this was performed on Xarelto therapy.  5) 02/10/2020: establish care with Dr. Lorenso Courier   CHIEF COMPLAINTS/PURPOSE OF CONSULTATION:  "history of DVT "  HISTORY OF PRESENTING ILLNESS:  Carol Johnson 54 y.o. female with medical history significant for CVA, GERD, HTN, lymphocytic colitis, and recurrent DVT who presents for evaluation of her DVTs.   On review of the previous records Carol Johnson was initially diagnosed with a left lower extremity DVT on 09/15/2014 when she presented the emergency department with lower extremity pain.  She had a lower extremity ultrasound performed that time which had findings consistent with acute deep vein thrombosis involving the posterior tibial vein.  This was thought to be provoked by either OCPs or smoking.  She was started on Xarelto therapy and continue this for 6 months time.  On 11/13/2019 the patient underwent another left lower extremity ultrasound which showed a segmental nonocclusive deep vein thrombosis in the mid left femoral vein.  She was  restarted on Xarelto therapy.  As part of the work-up for that finding she underwent lupus anticoagulant testing on 12/03/2019.  She was noted to be negative for anticardiolipin and beta-2 glycoprotein antibodies, however she was found to have a positive lupus anticoagulant.  This was unfortunately tested while on Xarelto therapy.  Due to concern for this patient's recurrent VTE she was referred to hematology for further evaluation and management.  On exam today Mr. Vargo notes that she has been well since starting anticoagulation therapy for her most recent DVT.  When discussing her DVT in 2016 the patient notes that it was initially diagnosed while she was smoking but she was unsure if she was taking estrogen-containing pills at the time.  She reports taking 6 months of Xarelto and having no difficulties with that therapy at that time.  More recently in September 2020 she had an episode of CVA for which she now has some residual right-sided deficits including a right-sided limp.  She of note she was following with physical therapy previously but does not use any devices to help her with ability such as a cane or walker.  She is also taking medications for cholesterol and blood pressure to help with this.  She does note some neck soreness today which she thinks might be due to favoritism of her left side.  As for her most recent clot the patient quit smoking in 2018 and had no other provoking factors.  She presented with right-sided leg pain but was incidentally found to have the left-sided blood clot.  She was once again started on Xarelto therapy and reports that she has tolerated it well so far with no bleeding, bruising, or dark stools.  She currently denies  having any issues with fevers, chills, sweats, nausea, vomiting or diarrhea.  A full 10 point ROS is listed below.  MEDICAL HISTORY:  Past Medical History:  Diagnosis Date  . Abdominal pain, unspecified site   . Allergic rhinitis   . Anxiety   .  Asthma   . Depression   . DVT (deep venous thrombosis) ([facility]) 2016  . Fibroid uterus   . GERD (gastroesophageal reflux disease)   . Hypercholesterolemia   . Hypertension   . Internal hemorrhoid   . Irritable bowel syndrome   . Lymphocytic colitis 01/02/2015  . Pneumonia 1998  . Stroke ([place])   . Ulcerative colitis ([place])   . URI (upper respiratory infection)   . Uterine fibroid   . Vitamin B12 deficiency     SURGICAL HISTORY: Past Surgical History:  Procedure Laterality Date  . CESAREAN SECTION  2004  . CHOLECYSTECTOMY     laparoscopic "gallstones"  . ESOPHAGOGASTRODUODENOSCOPY (EGD) WITH PROPOFOL N/A 05/03/2014   Procedure: ESOPHAGOGASTRODUODENOSCOPY (EGD) WITH PROPOFOL;  Surgeon: Inda Castle, MD;  Location: [facility] ENDOSCOPY;  Service: Endoscopy;  Laterality: N/A;  . MYOMECTOMY    . UTERINE FIBROID SURGERY      SOCIAL HISTORY: Social History   Socioeconomic History  . Marital status: Legally Separated    Spouse name: Not on file  . Number of children: 1  . Years of education: Not on file  . Highest education level: Not on file  Occupational History  . Occupation: Admin. assistant  Tobacco Use  . Smoking status: Former Smoker    Packs/day: 0.10    Years: 10.00    Pack years: 1.00    Types: Cigarettes    Quit date: 12/07/2017    Years since quitting: 2.1  . Smokeless tobacco: Never Used  Vaping Use  . Vaping Use: Never used  Substance and Sexual Activity  . Alcohol use: Not Currently    Alcohol/week: 5.0 standard drinks    Types: 5 Cans of beer per week  . Drug use: No  . Sexual activity: Yes    Birth control/protection: Pill  Other Topics Concern  . Not on file  Social History Narrative  . Not on file   Social Determinants of Health   Financial Resource Strain:   . Difficulty of Paying Living Expenses:   Food Insecurity:   . Worried About Charity fundraiser in the Last Year:   . Arboriculturist in the Last Year:   Transportation Needs:   . Lexicographer (Medical):   Marland Kitchen Lack of Transportation (Non-Medical):   Physical Activity:   . Days of Exercise per Week:   . Minutes of Exercise per Session:   Stress:   . Feeling of Stress :   Social Connections:   . Frequency of Communication with Friends and Family:   . Frequency of Social Gatherings with Friends and Family:   . Attends Religious Services:   . Active Member of Clubs or Organizations:   . Attends Archivist Meetings:   Marland Kitchen Marital Status:   Intimate Partner Violence:   . Fear of Current or Ex-Partner:   . Emotionally Abused:   Marland Kitchen Physically Abused:   . Sexually Abused:     FAMILY HISTORY: Family History  Problem Relation Age of Onset  . Pulmonary fibrosis Father   . Colon cancer Paternal Aunt   . Uterine cancer Paternal Grandmother        with mets to the colon   .  Heart failure Maternal Grandmother   . Heart disease Paternal Uncle   . Heart disease Paternal Aunt   . Kidney disease Cousin   . Hyperlipidemia Mother   . Mitral valve prolapse Mother   . Heart disease Mother        arrhythmia    ALLERGIES:  is allergic to sertraline hcl and zoloft [sertraline hcl].  MEDICATIONS:  Current Outpatient Medications  Medication Sig Dispense Refill  . acetaminophen (TYLENOL) 325 MG tablet Take 2 tablets (650 mg total) by mouth every 4 (four) hours as needed for mild pain (or temp > 37.5 C (99.5 F)). (Patient taking differently: Take 650 mg by mouth as needed for mild pain (or temp > 37.5 C (99.5 F)). )    . albuterol (PROVENTIL HFA;VENTOLIN HFA) 108 (90 Base) MCG/ACT inhaler Inhale 2 puffs into the lungs every 4 (four) hours as needed for wheezing or shortness of breath (cough, shortness of breath or wheezing.). (Patient taking differently: Inhale 2 puffs into the lungs every 4 (four) hours as needed for wheezing or shortness of breath (cough, shortness of breath or wheezing.). Patient taking as needed.) 1 Inhaler 1  . atorvastatin (LIPITOR) 80 MG tablet  TAKE 1 TABLET (80 MG TOTAL) BY MOUTH DAILY AT 6 PM. 90 tablet 1  . buPROPion (WELLBUTRIN XL) 300 MG 24 hr tablet Take 1 tablet (300 mg total) by mouth daily. 90 tablet 3  . citalopram (CELEXA) 20 MG tablet Take 1 tablet (20 mg total) by mouth daily. 90 tablet 3  . colestipol (COLESTID) 1 g tablet TAKE 1 TABLET (1 G TOTAL) BY MOUTH 2 (TWO) TIMES DAILY. 60 tablet 1  . DERMA-SMOOTHE/FS SCALP 0.01 % OIL Apply 1 a small amount to skin three times a week  Part hair, apply to scalp, cover with plastic cap, wash out in the morning    . fexofenadine (ALLEGRA) 180 MG tablet Take 180 mg by mouth daily as needed.     . fluticasone (FLONASE) 50 MCG/ACT nasal spray Place 2 sprays into both nostrils daily. 16 g 6  . ketoconazole (NIZORAL) 2 % shampoo Apply 1 application topically 2 (two) times a week.    . loperamide (IMODIUM) 2 MG capsule Take 1 capsule (2 mg total) by mouth as needed for diarrhea or loose stools. 30 capsule 0  . methocarbamol (ROBAXIN) 500 MG tablet Take 2 tablets (1,000 mg total) by mouth 2 (two) times daily as needed for muscle spasms. 120 tablet 2  . pantoprazole (PROTONIX) 40 MG tablet Take 1 tablet (40 mg total) by mouth daily. 90 tablet 3  . rivaroxaban (XARELTO) 20 MG TABS tablet Take 1 tablet (20 mg total) by mouth daily with supper. 90 tablet 3  . zolpidem (AMBIEN CR) 12.5 MG CR tablet TAKE 1 TABLET (12.5 MG TOTAL) BY MOUTH AT BEDTIME AS NEEDED FOR SLEEP 30 tablet 3  . saccharomyces boulardii (FLORASTOR) 250 MG capsule Take 1 capsule (250 mg total) by mouth 2 (two) times daily. (Patient not taking: Reported on 02/11/2020) 60 capsule 0  . topiramate (TOPAMAX) 25 MG tablet Take 1 tablet (25 mg total) by mouth 2 (two) times daily. (Patient not taking: Reported on 02/11/2020) 180 tablet 3   No current facility-administered medications for this visit.    REVIEW OF SYSTEMS:   Constitutional: ( - ) fevers, ( - )  chills , ( - ) night sweats Eyes: ( - ) blurriness of vision, ( - ) double  vision, ( - ) watery eyes  Ears, nose, mouth, throat, and face: ( - ) mucositis, ( - ) sore throat Respiratory: ( - ) cough, ( - ) dyspnea, ( - ) wheezes Cardiovascular: ( - ) palpitation, ( - ) chest discomfort, ( - ) lower extremity swelling Gastrointestinal:  ( - ) nausea, ( - ) heartburn, ( - ) change in bowel habits Skin: ( - ) abnormal skin rashes Lymphatics: ( - ) new lymphadenopathy, ( - ) easy bruising Neurological: ( - ) numbness, ( - ) tingling, ( - ) new weaknesses Behavioral/Psych: ( - ) mood change, ( - ) new changes  All other systems were reviewed with the patient and are negative.  PHYSICAL EXAMINATION: ECOG PERFORMANCE STATUS: 1 - Symptomatic but completely ambulatory  Vitals:   02/11/20 0917  BP: 123/83  Pulse: 81  Resp: 18  Temp: 97.7 F (36.5 C)  SpO2: 99%   Filed Weights   02/11/20 0917  Weight: 207 lb 4.8 oz (94 kg)    GENERAL: well appearing middle aged Caucasian female in NAD  SKIN: skin color, texture, turgor are normal, no rashes or significant lesions EYES: conjunctiva are pink and non-injected, sclera clear LUNGS: clear to auscultation and percussion with normal breathing effort HEART: regular rate & rhythm and no murmurs and no lower extremity edema Musculoskeletal: no cyanosis of digits and no clubbing  PSYCH: alert & oriented x 3, fluent speech NEURO: no focal motor/sensory deficits  LABORATORY DATA:  I have reviewed the data as listed CBC Latest Ref Rng & Units 12/03/2019 05/26/2019 05/24/2019  WBC 4.0 - 10.5 K/uL 8.8 7.1 10.7(H)  Hemoglobin 12.0 - 15.0 g/dL 12.5 11.5(L) 12.5  Hematocrit 36 - 46 % 38.5 36.1 40.1  Platelets 150 - 400 K/uL 379.0 335 499(H)    CMP Latest Ref Rng & Units 06/29/2019 06/05/2019 05/26/2019  Glucose 70 - 99 mg/dL - 99 107(H)  BUN 6 - 20 mg/dL - 11 6  Creatinine 0.44 - 1.00 mg/dL - 0.81 0.81  Sodium 135 - 145 mmol/L - 140 137  Potassium 3.5 - 5.1 mmol/L - 4.1 3.2(L)  Chloride 98 - 111 mmol/L - 105 105  CO2 22 - 32  mmol/L - 25 24  Calcium 8.9 - 10.3 mg/dL - 9.3 8.8(L)  Total Protein 6.5 - 8.1 g/dL - - 6.7  Total Bilirubin 0.3 - 1.2 mg/dL - - 0.4  Alkaline Phos 38 - 126 U/L - - 78  AST 10 - 35 U/L 34 - 33  ALT 6 - 29 U/L 39(H) - 33    RADIOGRAPHIC STUDIES: No results found.  ASSESSMENT & PLAN Carol Johnson 54 y.o. female with medical history significant for CVA, GERD, HTN, lymphocytic colitis, and recurrent DVT who presents for evaluation of her DVTs.  After review of the labs, review the prior records, discussion with the patient the findings are most consistent with an unprovoked VTE in the setting of a prior history of provoked VTE.  Given these findings I would recommend lifelong anticoagulation with DOAC therapy.  The patient is currently on Xarelto therapy 20 mg p.o. daily and after 6 months of full-strength anticoagulation this can be replaced with 10 mg p.o. daily maintenance Xarelto as thromboprophylaxis in the future.  In terms of the lupus anticoagulant testing I am concerned that the lupus anticoagulant panel as well as the hexagonal phase confirmation tests have been altered by the presence of a factor Xa inhibitor.  Given this I do not believe these results to be genuine.  In the event that they were genuine would not alter our management course given this is not high risk APS and therefore Xarelto therapy could be considered appropriate.  It is also reassuring that the patient does not have positive antiphospholipid antibody antibodies such as cardiolipin or beta-2 glycoprotein.  As such I do not think that further hypercoagulable work-up is merited at this time.  # Recurrent LLE DVT # Stroke in Right Corona Radiata/Internal Capsule --recommend continuation of Xarelto 66m PO daily indefinitely. After 6 months of therapeutic dose Xarelto can transition to 133mPO daily as a maintenance dose (NDelane Gingerngl J Med 2017; 376:1211-1222) --patient had negative APS antibodies, though was noted to have  positive lupus anticoagulant study. This is likely a false positive in the setting of DOAC therapy. No further hypercoagulable studies are required. --strict return precautions for bleeding, leg swelling/pain, or shortness of breath/chest pain.  --RTC in Sept 2021 to discuss transition to maintenance Xarelto.   Orders Placed This Encounter  Procedures  . CBC with Differential (Cancer Center Only)    Standing Status:   Future    Number of Occurrences:   1    Standing Expiration Date:   02/10/2021  . CMP ([facility])    Standing Status:   Future    Number of Occurrences:   1    Standing Expiration Date:   02/10/2021    All questions were answered. The patient knows to call the clinic with any problems, questions or concerns.  A total of more than 45 minutes were spent on this encounter and over half of that time was spent on counseling and coordination of care as outlined above.   JoLedell PeoplesMD Department of Hematology/Oncology CoRiverviewt WeGraham County Hospitalhone: [phone]: [phone]: [email or web address]  02/11/2020 10:04 AM   Literature Support:  WeLorene DyLensing AWA, Prins MH, Bauersachs R, Beyer-Westendorf J, Bounameaux H, BrWaupacaCohen AT, DaMillcreekL, Decousus H, FrRachelCS, HoMobridgeKaOphiemHaMalottvaSouth River, Pap AF, BeAT&TVerhamme P, Wells PS, Prandoni P; [employer] CHOICE Investigators. Rivaroxaban or Aspirin for Extended Treatment of Venous Thromboembolism. N Alison Stalling Med. 2017 Mar 30;376(13):1211-1222.  --Among patients with venous thromboembolism in equipoise for continued anticoagulation, the risk of a recurrent event was significantly lower with rivaroxaban at either a treatment dose (20 mg) or a prophylactic dose (10 mg) than with aspirin, without a significant increase in bleeding rates.

## 2020-02-11 ENCOUNTER — Inpatient Hospital Stay
Payer: No Typology Code available for payment source | Attending: Hematology and Oncology | Admitting: Hematology and Oncology

## 2020-02-11 ENCOUNTER — Other Ambulatory Visit: Payer: Self-pay

## 2020-02-11 ENCOUNTER — Inpatient Hospital Stay: Payer: No Typology Code available for payment source

## 2020-02-11 ENCOUNTER — Encounter: Payer: Self-pay | Admitting: Hematology and Oncology

## 2020-02-11 VITALS — BP 123/83 | HR 81 | Temp 97.7°F | Resp 18 | Ht 62.0 in | Wt 207.3 lb

## 2020-02-11 DIAGNOSIS — I82402 Acute embolism and thrombosis of unspecified deep veins of left lower extremity: Secondary | ICD-10-CM

## 2020-02-11 DIAGNOSIS — K219 Gastro-esophageal reflux disease without esophagitis: Secondary | ICD-10-CM | POA: Insufficient documentation

## 2020-02-11 DIAGNOSIS — Z86718 Personal history of other venous thrombosis and embolism: Secondary | ICD-10-CM | POA: Insufficient documentation

## 2020-02-11 DIAGNOSIS — Z7901 Long term (current) use of anticoagulants: Secondary | ICD-10-CM | POA: Insufficient documentation

## 2020-02-11 DIAGNOSIS — I10 Essential (primary) hypertension: Secondary | ICD-10-CM | POA: Insufficient documentation

## 2020-02-11 DIAGNOSIS — Z8673 Personal history of transient ischemic attack (TIA), and cerebral infarction without residual deficits: Secondary | ICD-10-CM | POA: Diagnosis not present

## 2020-02-11 LAB — CBC WITH DIFFERENTIAL (CANCER CENTER ONLY)
Abs Immature Granulocytes: 0.02 10*3/uL (ref 0.00–0.07)
Basophils Absolute: 0.1 10*3/uL (ref 0.0–0.1)
Basophils Relative: 1 %
Eosinophils Absolute: 0.7 10*3/uL — ABNORMAL HIGH (ref 0.0–0.5)
Eosinophils Relative: 8 %
HCT: 41.7 % (ref 36.0–46.0)
Hemoglobin: 13.3 g/dL (ref 12.0–15.0)
Immature Granulocytes: 0 %
Lymphocytes Relative: 21 %
Lymphs Abs: 1.8 10*3/uL (ref 0.7–4.0)
MCH: 26.4 pg (ref 26.0–34.0)
MCHC: 31.9 g/dL (ref 30.0–36.0)
MCV: 82.9 fL (ref 80.0–100.0)
Monocytes Absolute: 0.6 10*3/uL (ref 0.1–1.0)
Monocytes Relative: 6 %
Neutro Abs: 5.6 10*3/uL (ref 1.7–7.7)
Neutrophils Relative %: 64 %
Platelet Count: 374 10*3/uL (ref 150–400)
RBC: 5.03 MIL/uL (ref 3.87–5.11)
RDW: 14.4 % (ref 11.5–15.5)
WBC Count: 8.7 10*3/uL (ref 4.0–10.5)
nRBC: 0 % (ref 0.0–0.2)

## 2020-02-11 LAB — CMP (CANCER CENTER ONLY)
ALT: 22 U/L (ref 0–44)
AST: 21 U/L (ref 15–41)
Albumin: 3.8 g/dL (ref 3.5–5.0)
Alkaline Phosphatase: 97 U/L (ref 38–126)
Anion gap: 12 (ref 5–15)
BUN: 9 mg/dL (ref 6–20)
CO2: 26 mmol/L (ref 22–32)
Calcium: 10.1 mg/dL (ref 8.9–10.3)
Chloride: 105 mmol/L (ref 98–111)
Creatinine: 0.92 mg/dL (ref 0.44–1.00)
GFR, Est AFR Am: >60 mL/min (ref >60)
GFR, Estimated: >60 mL/min (ref >60)
Glucose, Bld: 102 mg/dL — ABNORMAL HIGH (ref 70–99)
Potassium: 4.1 mmol/L (ref 3.5–5.1)
Sodium: 143 mmol/L (ref 135–145)
Total Bilirubin: 0.6 mg/dL (ref 0.3–1.2)
Total Protein: 7.7 g/dL (ref 6.5–8.1)

## 2020-02-12 ENCOUNTER — Telehealth: Payer: Self-pay | Admitting: Hematology and Oncology

## 2020-02-12 NOTE — Telephone Encounter (Signed)
Scheduled per los. Called, not able to leave msg. Mailed printout  

## 2020-02-13 ENCOUNTER — Encounter: Payer: Self-pay | Admitting: Family Medicine

## 2020-02-13 MED ORDER — ZOLPIDEM TARTRATE ER 12.5 MG PO TBCR: 12.5000 mg | EXTENDED_RELEASE_TABLET | Freq: Every evening | ORAL | 3 refills | Status: DC | PRN

## 2020-02-13 NOTE — Telephone Encounter (Signed)
Name of [place] Name of Pharmacy:[facility] [address] Henrietta Dine or Written Date and Quantity:10/08/19#30 tabs with 3 refills Last Office Visit and Type:f/u DVT on 12/03/19 Next Office Visit and Type:none scheduled Last Controlled Substance Agreement Date:12/01/15 Last UDS:12/01/15

## 2020-02-18 ENCOUNTER — Ambulatory Visit: Payer: 59 | Admitting: Adult Health

## 2020-02-18 ENCOUNTER — Encounter: Payer: Self-pay | Admitting: Adult Health

## 2020-02-18 NOTE — Progress Notes (Deleted)
[facility] [address] [place]. [place] [address] [phone]       STROKE FOLLOW UP NOTE  Carol Johnson Date of Birth:  1966-02-06 Medical Record Number:  [id number]   Reason for Referral: stroke follow up    CHIEF COMPLAINT:  No chief complaint on file.   HPI:  Today, 02/18/2020, Carol Johnson is being seen for stroke follow-up.  She has been stable from a stroke standpoint with residual mild left-sided weakness which has been stable.  Denies new stroke/TIA symptoms.  Prior complaints of headaches present since stroke initiated Topamax ***.  Recent evaluation by hematology for recurrent DVT and recommended lifelong use of anticoagulation and remains on Xarelto without bleeding or bruising.  Continues on atorvastatin without myalgias.  Blood pressure today ***.  During hospital admission, imaging showed right ACA aneurysm with recent repeat imaging on 12/26/2019 which did not show evidence of previously noted aneurysm and prior report describing possible aneurysm appear to correspond to the origin of a small branch in the proximal right A2/ACA.  No further concerns at this time.     History provided for reference purposes only Update 11/27/2019 JM: Carol Johnson is being seen for stroke follow-up.  She has been stable from a stroke standpoint with residual mild left-sided weakness with ongoing improvement as well as ongoing headaches which have been present since her stroke.  Describes as pressure sensation in the back and right side of head typically occurs in the evening time.  Denies photophobia, phonophobia, N/V, visual changes or auras.  At times, headaches can be debilitating and has been using Tylenol daily with mild relief.  Recently found to have left femoral nonocclusive DVT on 11/13/2019 by PCP currently on Xarelto.  She is also continued on aspirin and denies bleeding or bruising.  Continues on atorvastatin without side effects.  Blood pressure today  satisfactory at 126/86.  Initially elevated with concern of hypertension contributing to headaches but found inadequate blood pressure cuff cause of inaccurate blood pressure readings.  No further concerns at this time.  Initial visit 07/30/2019 JM: Carol Johnson is a 54 year old female who is being seen today for hospital follow-up.  Residual deficits of mild left-sided weakness but overall great improvement.  She continues to work with outpatient therapies.  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  She is able to ambulate with a cane for long distance but is unassisted at home.  Denies any recent falls.  Completed 3 weeks DAPT and continues on aspirin alone without bleeding or bruising.  Continues on atorvastatin 80 mg daily without myalgias.  Reports recent lab work by PCP lipid panel satisfactory.  Blood pressure today 126/78.  Returned cardiac event monitor to cardiology yesterday completed 3 weeks on a 4-week recommendation due to skin irritation.  Reports stable depression/anxiety with ongoing use of Wellbutrin and Celexa.  No further concerns at this time.  Stroke admission 05/23/2019: Carol Johnson is a 54 y.o. female with history of of hypertension, depression, hypercholesterolemia, ulcerative colitis, irritable bowel, B12 deficiency, DVT in the past not on anticoagulation, asthma and concern for pulmonary fibrosis presented on 05/23/19 with L sided weakness.  Stroke work-up showed a posterior corona radiata/PL IC infarct and AchA territory infarct as evidenced on MRI likely secondary to small vessel disease source.  MRA head negative for LVO but did show right ACA A2 2 mm aneurysm.  MRA neck unremarkable.  2D  echo unremarkable.  Due to AchA territory infarct, recommended 30-day cardiac event monitoring to rule out atrial fibrillation.  Recommended DAPT for 3 weeks and aspirin alone.  HTN stable.  LDL 166 and increase atorvastatin dose  to 80 mg daily.  A1c 6.2 with a prior history of DM and recommended close PCP follow-up.  History of left posterior tibial vein DVT in 08/2014 contributing to estrogen use and smoking treated with Xarelto course.  Other stroke risk factors include former tobacco use, alcohol use and morbid obesity but no prior history of stroke.  Other active include thrombocytosis platelet 499, depression with anxiety and Wellbutrin and Celexa and ulcerative colitis on mesalamine.  Residual deficits of left hemiparesis and discharged to CIR on 05/25/2019 for ongoing therapy.  Discharged home on 06/06/2019 with recommendation of outpatient therapy.    ROS:   14 system review of systems performed and negative with exception of headaches  PMH:  Past Medical History:  Diagnosis Date  . Abdominal pain, unspecified site   . Allergic rhinitis   . Anxiety   . Asthma   . Depression   . DVT (deep venous thrombosis) ([place]) 2016  . Fibroid uterus   . GERD (gastroesophageal reflux disease)   . Hypercholesterolemia   . Hypertension   . Internal hemorrhoid   . Irritable bowel syndrome   . Lymphocytic colitis 01/02/2015  . Pneumonia 1998  . Stroke ([place])   . Ulcerative colitis ([place])   . URI (upper respiratory infection)   . Uterine fibroid   . Vitamin B12 deficiency     PSH:  Past Surgical History:  Procedure Laterality Date  . CESAREAN SECTION  2004  . CHOLECYSTECTOMY     laparoscopic "gallstones"  . ESOPHAGOGASTRODUODENOSCOPY (EGD) WITH PROPOFOL N/A 05/03/2014   Procedure: ESOPHAGOGASTRODUODENOSCOPY (EGD) WITH PROPOFOL;  Surgeon: Inda Castle, MD;  Location: [facility]COPY;  Service: Endoscopy;  Laterality: N/A;  . MYOMECTOMY    . UTERINE FIBROID SURGERY      Social History:  Social History   Socioeconomic History  . Marital status: Legally Separated    Spouse name: Not on file  . Number of children: 1  . Years of education: Not on file  . Highest education level: Not on file  Occupational History  .  Occupation: Admin. assistant  Tobacco Use  . Smoking status: Former Smoker    Packs/day: 0.10    Years: 10.00    Pack years: 1.00    Types: Cigarettes    Quit date: 12/07/2017    Years since quitting: 2.2  . Smokeless tobacco: Never Used  Vaping Use  . Vaping Use: Never used  Substance and Sexual Activity  . Alcohol use: Not Currently    Alcohol/week: 5.0 standard drinks    Types: 5 Cans of beer per week  . Drug use: No  . Sexual activity: Yes    Birth control/protection: Pill  Other Topics Concern  . Not on file  Social History Narrative  . Not on file   Social Determinants of Health   Financial Resource Strain:   . Difficulty of Paying Living Expenses:   Food Insecurity:   . Worried About Charity fundraiser in the Last Year:   . Arboriculturist in the Last Year:   Transportation Needs:   . Film/video editor (Medical):   Marland Kitchen Lack of Transportation (Non-Medical):   Physical Activity:   . Days of Exercise per Week:   . Minutes of Exercise per Session:  Stress:   . Feeling of Stress :   Social Connections:   . Frequency of Communication with Friends and Family:   . Frequency of Social Gatherings with Friends and Family:   . Attends Religious Services:   . Active Member of Clubs or Organizations:   . Attends Archivist Meetings:   Marland Kitchen Marital Status:   Intimate Partner Violence:   . Fear of Current or Ex-Partner:   . Emotionally Abused:   Marland Kitchen Physically Abused:   . Sexually Abused:     Family History:  Family History  Problem Relation Age of Onset  . Pulmonary fibrosis Father   . Colon cancer Paternal Aunt   . Uterine cancer Paternal Grandmother        with mets to the colon   . Heart failure Maternal Grandmother   . Heart disease Paternal Uncle   . Heart disease Paternal Aunt   . Kidney disease Cousin   . Hyperlipidemia Mother   . Mitral valve prolapse Mother   . Heart disease Mother        arrhythmia    Medications:   Current Outpatient  Medications on File Prior to Visit  Medication Sig Dispense Refill  . acetaminophen (TYLENOL) 325 MG tablet Take 2 tablets (650 mg total) by mouth every 4 (four) hours as needed for mild pain (or temp > 37.5 C (99.5 F)). (Patient taking differently: Take 650 mg by mouth as needed for mild pain (or temp > 37.5 C (99.5 F)). )    . albuterol (PROVENTIL HFA;VENTOLIN HFA) 108 (90 Base) MCG/ACT inhaler Inhale 2 puffs into the lungs every 4 (four) hours as needed for wheezing or shortness of breath (cough, shortness of breath or wheezing.). (Patient taking differently: Inhale 2 puffs into the lungs every 4 (four) hours as needed for wheezing or shortness of breath (cough, shortness of breath or wheezing.). Patient taking as needed.) 1 Inhaler 1  . atorvastatin (LIPITOR) 80 MG tablet TAKE 1 TABLET (80 MG TOTAL) BY MOUTH DAILY AT 6 PM. 90 tablet 1  . buPROPion (WELLBUTRIN XL) 300 MG 24 hr tablet Take 1 tablet (300 mg total) by mouth daily. 90 tablet 3  . citalopram (CELEXA) 20 MG tablet Take 1 tablet (20 mg total) by mouth daily. 90 tablet 3  . colestipol (COLESTID) 1 g tablet TAKE 1 TABLET (1 G TOTAL) BY MOUTH 2 (TWO) TIMES DAILY. 60 tablet 1  . DERMA-SMOOTHE/FS SCALP 0.01 % OIL Apply 1 a small amount to skin three times a week  Part hair, apply to scalp, cover with plastic cap, wash out in the morning    . fexofenadine (ALLEGRA) 180 MG tablet Take 180 mg by mouth daily as needed.     . fluticasone (FLONASE) 50 MCG/ACT nasal spray Place 2 sprays into both nostrils daily. 16 g 6  . ketoconazole (NIZORAL) 2 % shampoo Apply 1 application topically 2 (two) times a week.    . loperamide (IMODIUM) 2 MG capsule Take 1 capsule (2 mg total) by mouth as needed for diarrhea or loose stools. 30 capsule 0  . methocarbamol (ROBAXIN) 500 MG tablet Take 2 tablets (1,000 mg total) by mouth 2 (two) times daily as needed for muscle spasms. 120 tablet 2  . pantoprazole (PROTONIX) 40 MG tablet Take 1 tablet (40 mg total) by mouth  daily. 90 tablet 3  . rivaroxaban (XARELTO) 20 MG TABS tablet Take 1 tablet (20 mg total) by mouth daily with supper. 90 tablet 3  .  saccharomyces boulardii (FLORASTOR) 250 MG capsule Take 1 capsule (250 mg total) by mouth 2 (two) times daily. (Patient not taking: Reported on 02/11/2020) 60 capsule 0  . topiramate (TOPAMAX) 25 MG tablet Take 1 tablet (25 mg total) by mouth 2 (two) times daily. (Patient not taking: Reported on 02/11/2020) 180 tablet 3  . zolpidem (AMBIEN CR) 12.5 MG CR tablet Take 1 tablet (12.5 mg total) by mouth at bedtime as needed. for sleep 30 tablet 3   No current facility-administered medications on file prior to visit.    Allergies:   Allergies  Allergen Reactions  . Sertraline Hcl     REACTION: diarrhea  . Zoloft [Sertraline Hcl] Diarrhea     Physical Exam  There were no vitals filed for this visit. There is no height or weight on file to calculate BMI. No exam data present  General: well developed, well nourished, pleasant middle-age Caucasian female, seated, in no evident distress Head: head normocephalic and atraumatic.   Neck: supple with no carotid or supraclavicular bruits Cardiovascular: regular rate and rhythm, no murmurs Musculoskeletal: no deformity Skin:  no rash/petichiae Vascular:  Normal pulses all extremities   Neurologic Exam Mental Status: Awake and fully alert.   Normal speech and language.  Oriented to place and time. Recent and remote memory intact. Attention span, concentration and fund of knowledge appropriate. Mood and affect appropriate.  Cranial Nerves: Pupils equal, briskly reactive to light. Extraocular movements full without nystagmus. Visual fields full to confrontation. Hearing intact. Facial sensation intact. Face, tongue, palate moves normally and symmetrically.  Motor: Normal bulk and tone. Normal strength in all tested extremity muscles except slightly decreased left hand dexterity. Sensory.: intact to touch , pinprick ,  position and vibratory sensation.  Coordination: Rapid alternating movements normal in all extremities except slightly decreased left hand. Finger-to-nose and heel-to-shin performed accurately bilaterally. Gait and Station: Arises from chair without difficulty. Stance is normal. Gait demonstrates  mild hemiplegic and steppage gait Reflexes: 1+ and symmetric. Toes downgoing.       ASSESSMENT: Carol Johnson is a 54 y.o. year old female presented with left-sided weakness on 05/23/2019 with stroke work-up showing right posterior corona radiata/PICA infarct and AchA territory infarct likely secondary to small vessel disease source but recommended 30 cardiac event monitor due to AchA territory infarct to rule out atrial fibrillation which was negative.  During stroke work-up, imaging showed evidence of right ACA 2 mm aneurysm.  Vascular risk factors include HTN, HLD, history of DVT, history of tobacco use, alcohol use and obesity.  Residual deficits of mild decrease left hand dexterity and gait impairment as well as poststroke vs tension headaches     PLAN:  1. Ischemic stroke:  -Continue Xarelto (rivaroxaban) daily  and atorvastatin 80 mg daily for secondary stroke prevention.  Advised to discontinue aspirin as not indicated while on Xarelto therapy for acute DVT.  Advised to restart aspirin once Xarelto therapy completed.  -Maintain strict control of hypertension with blood pressure goal below 130/90, diabetes with hemoglobin A1c goal below 6.5% and cholesterol with LDL cholesterol (bad cholesterol) goal below 70 mg/dL.  I also advised the patient to eat a healthy diet with plenty of whole grains, cereals, fruits and vegetables, exercise regularly with at least 30 minutes of continuous activity daily and maintain ideal body weight.  2. HTN: Stable.  Ongoing management by PCP 3. HLD: Stable.  Ongoing management by PCP 4. Headaches: Likely tension vascular headache.  Less likely migraine related.  Recommend initiating Topamax 25 mg twice daily and importance of stress reduction techniques.  Advised to decrease use of Tylenol due to concern of rebound headache.  Negative for concerning symptoms or red flags therefore no further work-up indicated at this time. 5. Right ACA 2 mm aneurysm: Repeat MRA head for surveillance monitoring 6. Acute DVT, right femoral: Recently found to have lower extremity ultrasound.  Continuation of Xarelto as instructed by PCP with ongoing monitoring and management    Follow up in 3 months or call earlier if needed   I spent 33 minutes of face-to-face and non-face-to-face time with patient.  This included previsit chart review, lab review, study review, order entry, electronic health record documentation, patient education regarding history of stroke, ongoing headaches, risk versus benefit of initiating Topamax, incidental finding of aneurysm and recently diagnosed DVT and answered all questions to patient satisfaction     Frann Rider, AGNP-BC  [facility] [address] [place], [place] [address]  Phone [phone] Fax [phone] Note: This document was prepared with digital dictation and possible smart phrase technology. Any transcriptional errors that result from this process are unintentional.

## 2020-03-04 ENCOUNTER — Ambulatory Visit: Payer: 59 | Admitting: Adult Health

## 2020-04-22 ENCOUNTER — Other Ambulatory Visit: Payer: Self-pay | Admitting: Gastroenterology

## 2020-05-10 ENCOUNTER — Other Ambulatory Visit: Payer: Self-pay | Admitting: Gastroenterology

## 2020-05-13 ENCOUNTER — Other Ambulatory Visit: Payer: Self-pay | Admitting: Hematology and Oncology

## 2020-05-13 DIAGNOSIS — I82402 Acute embolism and thrombosis of unspecified deep veins of left lower extremity: Secondary | ICD-10-CM

## 2020-05-13 NOTE — Progress Notes (Signed)
[place] [phone]   [phone]  PROGRESS NOTE  Patient Care Team: Tower, Wynelle Fanny, MD as PCP - General End, Harrell Gave, MD as PCP - Cardiology (Cardiology)  Hematological/Oncological History # Recurrent LLE DVT 1) 09/15/2014: patient underwent LE [place] which showed findings consistent with acute deep vein thrombosis involving the posterior tibial vein of the left lower extremity. Thought to be provoked by OCPs vs smoking. Completed 6 months of Xarelto therapy.  2) 05/23/2019: patient had a CVA, acute infarct involving right corona radiata/internal capsule and extending into the right basal ganglia 3) 11/13/2019: patient underwent LE [place] which showed segmental nonocclusive deep venous thrombosis in the mid LEFT femoral vein. Restarted on Xarelto therapy 4) 12/03/2019: APS workup performed, negative for Anticardiolipin, Beta-2-glycoprotein. Lupus anticoagulant detected, though this was performed on Xarelto therapy.  5) 02/11/2020: establish care with Dr. Lorenso Courier   Interval History:  Carol Johnson 54 y.o. female with medical history significant for recurrent LLE DVT who presents for a follow up visit. The patient's last visit was on 02/11/2020 at which time she established care. In the interim since the last visit she has had no hospitalizations, emergency rooms visits, or other changes in her health.   On exam today Carol Johnson notes she has been over well in the interim since our last visit. She reports that she has been having periodic nose bleeds as a result of "bumps that pop" inside her nose. She notes she has had some blood on the tissue paper when wiping after a BM with some occasional blood in the water of the toilet bowl. She notes her BMs can be painfulbut she denies constipation, reporting that she predominately has diarrhea. She does endorse fatigue. She has occasional pain in her right legs but no signs or symptoms consistent with recurrent VTE. A full  10 point ROS is listed below.   MEDICAL HISTORY:  Past Medical History:  Diagnosis Date  . Abdominal pain, unspecified site   . Allergic rhinitis   . Anxiety   . Asthma   . Depression   . DVT (deep venous thrombosis) ([place]) 2016  . Fibroid uterus   . GERD (gastroesophageal reflux disease)   . Hypercholesterolemia   . Hypertension   . Internal hemorrhoid   . Irritable bowel syndrome   . Lymphocytic colitis 01/02/2015  . Pneumonia 1998  . Stroke ([place])   . Ulcerative colitis ([place])   . URI (upper respiratory infection)   . Uterine fibroid   . Vitamin B12 deficiency     SURGICAL HISTORY: Past Surgical History:  Procedure Laterality Date  . CESAREAN SECTION  2004  . CHOLECYSTECTOMY     laparoscopic "gallstones"  . ESOPHAGOGASTRODUODENOSCOPY (EGD) WITH PROPOFOL N/A 05/03/2014   Procedure: ESOPHAGOGASTRODUODENOSCOPY (EGD) WITH PROPOFOL;  Surgeon: Inda Castle, MD;  Location: [facility];  Service: Endoscopy;  Laterality: N/A;  . MYOMECTOMY    . UTERINE FIBROID SURGERY      SOCIAL HISTORY: Social History   Socioeconomic History  . Marital status: Legally Separated    Spouse name: Not on file  . Number of children: 1  . Years of education: Not on file  . Highest education level: Not on file  Occupational History  . Occupation: Admin. assistant  Tobacco Use  . Smoking status: Former Smoker    Packs/day: 0.10    Years: 10.00    Pack years: 1.00    Types: Cigarettes    Quit date: 12/07/2017    Years  since quitting: 2.4  . Smokeless tobacco: Never Used  Vaping Use  . Vaping Use: Never used  Substance and Sexual Activity  . Alcohol use: Not Currently    Alcohol/week: 5.0 standard drinks    Types: 5 Cans of beer per week  . Drug use: No  . Sexual activity: Yes    Birth control/protection: Pill  Other Topics Concern  . Not on file  Social History Narrative  . Not on file   Social Determinants of Health   Financial Resource Strain:   . Difficulty of Paying  Living Expenses: Not on file  Food Insecurity:   . Worried About Charity fundraiser in the Last Year: Not on file  . Ran Out of Food in the Last Year: Not on file  Transportation Needs:   . Lack of Transportation (Medical): Not on file  . Lack of Transportation (Non-Medical): Not on file  Physical Activity:   . Days of Exercise per Week: Not on file  . Minutes of Exercise per Session: Not on file  Stress:   . Feeling of Stress : Not on file  Social Connections:   . Frequency of Communication with Friends and Family: Not on file  . Frequency of Social Gatherings with Friends and Family: Not on file  . Attends Religious Services: Not on file  . Active Member of Clubs or Organizations: Not on file  . Attends Archivist Meetings: Not on file  . Marital Status: Not on file  Intimate Partner Violence:   . Fear of Current or Ex-Partner: Not on file  . Emotionally Abused: Not on file  . Physically Abused: Not on file  . Sexually Abused: Not on file    FAMILY HISTORY: Family History  Problem Relation Age of Onset  . Pulmonary fibrosis Father   . Colon cancer Paternal Aunt   . Uterine cancer Paternal Grandmother        with mets to the colon   . Heart failure Maternal Grandmother   . Heart disease Paternal Uncle   . Heart disease Paternal Aunt   . Kidney disease Cousin   . Hyperlipidemia Mother   . Mitral valve prolapse Mother   . Heart disease Mother        arrhythmia    ALLERGIES:  is allergic to sertraline hcl and zoloft [sertraline hcl].  MEDICATIONS:  Current Outpatient Medications  Medication Sig Dispense Refill  . acetaminophen (TYLENOL) 325 MG tablet Take 2 tablets (650 mg total) by mouth every 4 (four) hours as needed for mild pain (or temp > 37.5 C (99.5 F)). (Patient taking differently: Take 650 mg by mouth as needed for mild pain (or temp > 37.5 C (99.5 F)). )    . albuterol (PROVENTIL HFA;VENTOLIN HFA) 108 (90 Base) MCG/ACT inhaler Inhale 2 puffs into  the lungs every 4 (four) hours as needed for wheezing or shortness of breath (cough, shortness of breath or wheezing.). (Patient taking differently: Inhale 2 puffs into the lungs every 4 (four) hours as needed for wheezing or shortness of breath (cough, shortness of breath or wheezing.). Patient taking as needed.) 1 Inhaler 1  . atorvastatin (LIPITOR) 80 MG tablet TAKE 1 TABLET (80 MG TOTAL) BY MOUTH DAILY AT 6 PM. 90 tablet 1  . buPROPion (WELLBUTRIN XL) 300 MG 24 hr tablet Take 1 tablet (300 mg total) by mouth daily. 90 tablet 3  . citalopram (CELEXA) 20 MG tablet Take 1 tablet (20 mg total) by mouth daily.  90 tablet 3  . colestipol (COLESTID) 1 g tablet TAKE 2 TABLETS (2 G TOTAL) BY MOUTH DAILY. 30 tablet 2  . DERMA-SMOOTHE/FS SCALP 0.01 % OIL Apply 1 a small amount to skin three times a week  Part hair, apply to scalp, cover with plastic cap, wash out in the morning    . fexofenadine (ALLEGRA) 180 MG tablet Take 180 mg by mouth daily as needed.     . fluticasone (FLONASE) 50 MCG/ACT nasal spray Place 2 sprays into both nostrils daily. 16 g 6  . ketoconazole (NIZORAL) 2 % shampoo Apply 1 application topically 2 (two) times a week.    . loperamide (IMODIUM) 2 MG capsule Take 1 capsule (2 mg total) by mouth as needed for diarrhea or loose stools. 30 capsule 0  . methocarbamol (ROBAXIN) 500 MG tablet Take 2 tablets (1,000 mg total) by mouth 2 (two) times daily as needed for muscle spasms. 120 tablet 2  . pantoprazole (PROTONIX) 40 MG tablet Take 1 tablet (40 mg total) by mouth daily. 90 tablet 3  . rivaroxaban (XARELTO) 20 MG TABS tablet Take 1 tablet (20 mg total) by mouth daily with supper. 90 tablet 3  . saccharomyces boulardii (FLORASTOR) 250 MG capsule Take 1 capsule (250 mg total) by mouth 2 (two) times daily. (Patient not taking: Reported on 02/11/2020) 60 capsule 0  . topiramate (TOPAMAX) 25 MG tablet Take 1 tablet (25 mg total) by mouth 2 (two) times daily. (Patient not taking: Reported on  02/11/2020) 180 tablet 3  . zolpidem (AMBIEN CR) 12.5 MG CR tablet Take 1 tablet (12.5 mg total) by mouth at bedtime as needed. for sleep 30 tablet 3   No current facility-administered medications for this visit.    REVIEW OF SYSTEMS:   Constitutional: ( - ) fevers, ( - )  chills , ( - ) night sweats Eyes: ( - ) blurriness of vision, ( - ) double vision, ( - ) watery eyes Ears, nose, mouth, throat, and face: ( - ) mucositis, ( - ) sore throat Respiratory: ( - ) cough, ( - ) dyspnea, ( - ) wheezes Cardiovascular: ( - ) palpitation, ( - ) chest discomfort, ( - ) lower extremity swelling Gastrointestinal:  ( - ) nausea, ( - ) heartburn, ( - ) change in bowel habits Skin: ( - ) abnormal skin rashes Lymphatics: ( - ) new lymphadenopathy, ( - ) easy bruising Neurological: ( - ) numbness, ( - ) tingling, ( - ) new weaknesses Behavioral/Psych: ( - ) mood change, ( - ) new changes  All other systems were reviewed with the patient and are negative.  PHYSICAL EXAMINATION: ECOG PERFORMANCE STATUS: 1 - Symptomatic but completely ambulatory  There were no vitals filed for this visit. There were no vitals filed for this visit.  GENERAL: well appearing middle aged Caucasian female. Alert, no distress and comfortable SKIN: skin color, texture, turgor are normal, no rashes or significant lesions Nose: no overt abnormalities of nares and nasal cavity bilaterally on exam.  EYES: conjunctiva are pink and non-injected, sclera clear LUNGS: clear to auscultation and percussion with normal breathing effort HEART: regular rate & rhythm and no murmurs and no lower extremity edema Musculoskeletal: no cyanosis of digits and no clubbing  PSYCH: alert & oriented x 3, fluent speech NEURO: no focal motor/sensory deficits  LABORATORY DATA:  I have reviewed the data as listed CBC Latest Ref Rng & Units 02/11/2020 12/03/2019 05/26/2019  WBC 4.0 - 10.5 K/uL  8.7 8.8 7.1  Hemoglobin 12.0 - 15.0 g/dL 13.3 12.5 11.5(L)   Hematocrit 36 - 46 % 41.7 38.5 36.1  Platelets 150 - 400 K/uL 374 379.0 335    CMP Latest Ref Rng & Units 02/11/2020 06/29/2019 06/05/2019  Glucose 70 - 99 mg/dL 102(H) - 99  BUN 6 - 20 mg/dL 9 - 11  Creatinine 0.44 - 1.00 mg/dL 0.92 - 0.81  Sodium 135 - 145 mmol/L 143 - 140  Potassium 3.5 - 5.1 mmol/L 4.1 - 4.1  Chloride 98 - 111 mmol/L 105 - 105  CO2 22 - 32 mmol/L 26 - 25  Calcium 8.9 - 10.3 mg/dL 10.1 - 9.3  Total Protein 6.5 - 8.1 g/dL 7.7 - -  Total Bilirubin 0.3 - 1.2 mg/dL 0.6 - -  Alkaline Phos 38 - 126 U/L 97 - -  AST 15 - 41 U/L 21 34 -  ALT 0 - 44 U/L 22 39(H) -    RADIOGRAPHIC STUDIES: No results found.  ASSESSMENT & PLAN Carol Johnson 54 y.o. female with medical history significant for CVA, GERD, HTN, lymphocytic colitis, and recurrent DVT who presents for evaluation of her DVTs.  After review of the labs, review the prior records, discussion with the patient the findings are most consistent with an unprovoked VTE in the setting of a prior history of provoked VTE.  Given these findings I would recommend lifelong anticoagulation with DOAC therapy.   Carol Johnson is currently on Xarelto therapy 20 mg p.o. daily and has completed 6 months of full-strength anticoagulation. At this time her current dose can replaced with 10 mg p.o. daily maintenance Xarelto as thromboprophylaxis. Today we discussed the transition to a lower dose, but she declined and wished to continue to the full dose. She reports she has minor bleeding with some nosebleeds and bright red blood on the toilet paper. Her Hgb and labs are otherwise stable.   In terms of the lupus anticoagulant testing I am concerned that the lupus anticoagulant panel as well as the hexagonal phase confirmation tests have been altered by the presence of a factor Xa inhibitor.  Given this I do not believe these results to be genuine.  In the event that they were genuine would not alter our management course given this is not high  risk APS and therefore Xarelto therapy could be considered appropriate.  It is also reassuring that the patient does not have positive antiphospholipid antibody antibodies such as cardiolipin or beta-2 glycoprotein.  As such I do not think that further hypercoagulable work-up is merited at this time.  # Recurrent LLE DVT # Stroke in Right Corona Radiata/Internal Capsule --recommend continuation of anticoagulation indefinitely. --She is currently s/p 6 months of therapeutic dose Xarelto. Today discussed transition to 6m PO daily as a maintenance dose (Alison StallingJ Med 2017; 376:1211-1222) --patient had negative APS antibodies, though was noted to have positive lupus anticoagulant study. This is likely a false positive in the setting of DOAC therapy. No further hypercoagulable studies are required. --strict return precautions for bleeding, leg swelling/pain, or shortness of breath/chest pain.  --RTC in 6 months to continue monitoring on anticoagulation.   No orders of the defined types were placed in this encounter.  All questions were answered. The patient knows to call the clinic with any problems, questions or concerns.  A total of more than 30 minutes were spent on this encounter and over half of that time was spent on counseling and coordination of care as  outlined above.   Ledell Peoples, MD Department of Hematology/Oncology [place] [facility] Phone: [phone] Pager: [phone] Email: [email or web address]  05/13/2020 5:46 PM

## 2020-05-14 ENCOUNTER — Other Ambulatory Visit: Payer: Self-pay

## 2020-05-14 ENCOUNTER — Inpatient Hospital Stay: Payer: No Typology Code available for payment source

## 2020-05-14 ENCOUNTER — Inpatient Hospital Stay
Payer: No Typology Code available for payment source | Attending: Hematology and Oncology | Admitting: Hematology and Oncology

## 2020-05-14 ENCOUNTER — Encounter: Payer: Self-pay | Admitting: Hematology and Oncology

## 2020-05-14 VITALS — BP 131/79 | HR 78 | Temp 99.3°F | Resp 18 | Ht 62.0 in | Wt 219.9 lb

## 2020-05-14 DIAGNOSIS — K219 Gastro-esophageal reflux disease without esophagitis: Secondary | ICD-10-CM | POA: Insufficient documentation

## 2020-05-14 DIAGNOSIS — Z8673 Personal history of transient ischemic attack (TIA), and cerebral infarction without residual deficits: Secondary | ICD-10-CM | POA: Diagnosis not present

## 2020-05-14 DIAGNOSIS — Z7901 Long term (current) use of anticoagulants: Secondary | ICD-10-CM | POA: Insufficient documentation

## 2020-05-14 DIAGNOSIS — I82402 Acute embolism and thrombosis of unspecified deep veins of left lower extremity: Secondary | ICD-10-CM | POA: Diagnosis not present

## 2020-05-14 DIAGNOSIS — Z86718 Personal history of other venous thrombosis and embolism: Secondary | ICD-10-CM | POA: Insufficient documentation

## 2020-05-14 DIAGNOSIS — I10 Essential (primary) hypertension: Secondary | ICD-10-CM | POA: Diagnosis not present

## 2020-05-14 LAB — CMP (CANCER CENTER ONLY)
ALT: 19 U/L (ref 0–44)
AST: 16 U/L (ref 15–41)
Albumin: 3.5 g/dL (ref 3.5–5.0)
Alkaline Phosphatase: 119 U/L (ref 38–126)
Anion gap: 5 (ref 5–15)
BUN: 10 mg/dL (ref 6–20)
CO2: 29 mmol/L (ref 22–32)
Calcium: 9.4 mg/dL (ref 8.9–10.3)
Chloride: 106 mmol/L (ref 98–111)
Creatinine: 0.88 mg/dL (ref 0.44–1.00)
GFR, Est AFR Am: >60 mL/min (ref >60)
GFR, Estimated: >60 mL/min (ref >60)
Glucose, Bld: 104 mg/dL — ABNORMAL HIGH (ref 70–99)
Potassium: 4.2 mmol/L (ref 3.5–5.1)
Sodium: 140 mmol/L (ref 135–145)
Total Bilirubin: 0.4 mg/dL (ref 0.3–1.2)
Total Protein: 7.2 g/dL (ref 6.5–8.1)

## 2020-05-14 LAB — CBC WITH DIFFERENTIAL (CANCER CENTER ONLY)
Abs Immature Granulocytes: 0.02 10*3/uL (ref 0.00–0.07)
Basophils Absolute: 0.1 10*3/uL (ref 0.0–0.1)
Basophils Relative: 1 %
Eosinophils Absolute: 0.6 10*3/uL — ABNORMAL HIGH (ref 0.0–0.5)
Eosinophils Relative: 7 %
HCT: 38.1 % (ref 36.0–46.0)
Hemoglobin: 12.1 g/dL (ref 12.0–15.0)
Immature Granulocytes: 0 %
Lymphocytes Relative: 19 %
Lymphs Abs: 1.7 10*3/uL (ref 0.7–4.0)
MCH: 26.2 pg (ref 26.0–34.0)
MCHC: 31.8 g/dL (ref 30.0–36.0)
MCV: 82.5 fL (ref 80.0–100.0)
Monocytes Absolute: 0.6 10*3/uL (ref 0.1–1.0)
Monocytes Relative: 6 %
Neutro Abs: 5.9 10*3/uL (ref 1.7–7.7)
Neutrophils Relative %: 67 %
Platelet Count: 382 10*3/uL (ref 150–400)
RBC: 4.62 MIL/uL (ref 3.87–5.11)
RDW: 14.4 % (ref 11.5–15.5)
WBC Count: 8.7 10*3/uL (ref 4.0–10.5)
nRBC: 0 % (ref 0.0–0.2)

## 2020-05-15 ENCOUNTER — Telehealth: Payer: Self-pay | Admitting: Hematology and Oncology

## 2020-05-15 NOTE — Telephone Encounter (Signed)
Scheduled per los. Called and left msg. Mailed printout  °

## 2020-05-20 ENCOUNTER — Other Ambulatory Visit: Payer: Self-pay | Admitting: Gastroenterology

## 2020-05-30 ENCOUNTER — Encounter: Payer: Self-pay | Admitting: Family Medicine

## 2020-05-31 ENCOUNTER — Other Ambulatory Visit: Payer: Self-pay | Admitting: Gastroenterology

## 2020-06-11 ENCOUNTER — Other Ambulatory Visit: Payer: Self-pay | Admitting: Gastroenterology

## 2020-06-13 ENCOUNTER — Encounter: Payer: Self-pay | Admitting: Family Medicine

## 2020-06-13 ENCOUNTER — Other Ambulatory Visit: Payer: Self-pay | Admitting: Family Medicine

## 2020-06-13 MED ORDER — ZOLPIDEM TARTRATE ER 12.5 MG PO TBCR
12.5000 mg | EXTENDED_RELEASE_TABLET | Freq: Every evening | ORAL | 3 refills | Status: DC | PRN
Start: 2020-06-13 — End: 2020-06-16

## 2020-06-13 NOTE — Telephone Encounter (Signed)
Name of Medication: Ambien CR   Name of Pharmacy: [facility] [address]   Henrietta Dine or Written Date and Quantity: 02/13/20 #30 tabs with 3 refills   Last Office Visit and Type: neck pain on 01/10/20   Next Office Visit and Type: none scheduled   Last Controlled Substance Agreement Date: 12/01/15   Last UDS:12/01/15

## 2020-06-13 NOTE — Telephone Encounter (Signed)
See separate refill request that was sent by pharmacy

## 2020-06-13 NOTE — Telephone Encounter (Signed)
I had to print it

## 2020-06-16 MED ORDER — ZOLPIDEM TARTRATE ER 12.5 MG PO TBCR
12.5000 mg | EXTENDED_RELEASE_TABLET | Freq: Every evening | ORAL | 3 refills | Status: DC | PRN
Start: 2020-06-16 — End: 2020-11-19

## 2020-06-16 NOTE — Telephone Encounter (Signed)
Rx was printed due to issues on Friday, if you would like to resend it electronically so pt doesn't have to pick up Rx, I can destroy the paper Rx

## 2020-06-16 NOTE — Telephone Encounter (Signed)
I sent it electronically

## 2020-06-16 NOTE — Telephone Encounter (Signed)
Pt calling checking on her ambiem rx.  She stated pharmacy is waiting on prior autho  Best number [phone]  Pt is out of her meds

## 2020-06-21 ENCOUNTER — Other Ambulatory Visit: Payer: Self-pay | Admitting: Gastroenterology

## 2020-06-23 ENCOUNTER — Encounter: Payer: Self-pay | Admitting: Family Medicine

## 2020-06-24 MED ORDER — OLOPATADINE HCL 0.1 % OP SOLN: 1.0000 [drp] | Freq: Two times a day (BID) | OPHTHALMIC | 11 refills | Status: DC

## 2020-07-07 ENCOUNTER — Other Ambulatory Visit: Payer: Self-pay

## 2020-07-07 ENCOUNTER — Telehealth: Payer: Self-pay

## 2020-07-07 ENCOUNTER — Encounter: Payer: Self-pay | Admitting: Family Medicine

## 2020-07-07 ENCOUNTER — Ambulatory Visit: Payer: No Typology Code available for payment source | Admitting: Family Medicine

## 2020-07-07 VITALS — BP 120/74 | HR 73 | Temp 98.3°F | Ht 62.0 in | Wt 219.8 lb

## 2020-07-07 DIAGNOSIS — M5412 Radiculopathy, cervical region: Secondary | ICD-10-CM | POA: Diagnosis not present

## 2020-07-07 DIAGNOSIS — Z23 Encounter for immunization: Secondary | ICD-10-CM

## 2020-07-07 DIAGNOSIS — R059 Cough, unspecified: Secondary | ICD-10-CM

## 2020-07-07 MED ORDER — ALBUTEROL SULFATE HFA 108 (90 BASE) MCG/ACT IN AERS: 2.0000 | INHALATION_SPRAY | RESPIRATORY_TRACT | 0 refills | Status: DC | PRN

## 2020-07-07 NOTE — Telephone Encounter (Signed)
[place] [facility] - Client TELEPHONE ADVICE RECORD [facility] Patient Name: Carol Johnson Gender: Female DOB: 05-20-1966 Age: 54 Y 65 M Return Phone Number: [phone] (Primary) Address: City/State/Zip: [place] [address] Client [name] - Client Client Site [place] - Day Contact Type Call Who Is Calling Patient / Member / Family / Caregiver Call Type Triage / Clinical Relationship To Patient Self Return Phone Number [phone] (Primary) Chief Complaint Tingling Reason for Call Symptomatic / Request for [place] states need numbness in hands and needs her cholesterol checked. Hands go numb when she sleeps or is doing something Translation No Nurse Assessment Nurse: Jearld Pies, RN, Lovena Le Date/Time [name] Time): 07/07/2020 10:03:04 AM Confirm and document reason for call. If symptomatic, describe symptoms. ---Pt has been having numbness in both hands on and off for weeks but seems to be getting work. Numbness occurs mostly when sleeping or using hands like folding laundry or driving. Eating, drinking, and urinating normally. Chest tightness comes and goes but not present now. Denies difficulty breathing, fever, dizziness, visual changes, or any other symptoms at this time. Does the patient have any new or worsening symptoms? ---Yes Will a triage be completed? ---Yes Related visit to physician within the last 2 weeks? ---No Does the PT have any chronic conditions? (i.e. diabetes, asthma, this includes High risk factors for pregnancy, etc.) ---Yes List chronic conditions. ---Stroke, Cholesterol, Asthma Is this a behavioral health or substance abuse call? ---No Guidelines Guideline Title Affirmed Question Affirmed Notes Nurse Date/Time [name] Time) Neurologic Deficit Back pain (and neurologic deficit) [name] 07/07/2020 10:05:31 AM Disp. Time [name]  Time) Disposition Final User 07/07/2020 10:10:00 AM See HCP within 4 Hours (or PCP triage) Yes Jearld Pies, RN, Lovena Le PLEASE NOTE: All timestamps contained within this report are represented as [place] Standard Time. CONFIDENTIALTY NOTICE: This fax transmission is intended only for the addressee. It contains information that is legally privileged, confidential or otherwise protected from use or disclosure. If you are not the intended recipient, you are strictly prohibited from reviewing, disclosing, copying using or disseminating any of this information or taking any action in reliance on or regarding this information. If you have received this fax in error, please notify us immediately by telephone so that we can arrange for its return to [place]. Phone: [phone], Toll-Free: [phone], Fax: [phone] Page: 2 of 2 Call Id: [id number] [place] Disagree/Comply Comply Caller Understands Yes PreDisposition Call Doctor Care Advice Given Per Guideline SEE HCP (OR PCP TRIAGE) WITHIN 4 HOURS: * You become worse CALL BACK IF: Comments User: Malissa Hippo, RN Date/Time (Eastern Time): 07/07/2020 10:10:47 AM Contacted back line @ [phone] for app. Scheduled for 2:40pm this afternoon. Referrals Warm transfer to backline

## 2020-07-07 NOTE — Progress Notes (Signed)
Carol Cygan T. Cataleyah Colborn, MD, [place] [facility] [place], [address]  Phone: [phone]  FAX: [phone]  Carol Johnson - 54 y.o. female  MRN [id number]  Date of Birth: 04/27/1966  Date: 07/07/2020  PCP: Abner Greenspan, MD  Referral: Abner Greenspan, MD  Chief Complaint  Patient presents with  . Numbness    Bilateral Hands    This visit occurred during the SARS-CoV-2 public health emergency.  Safety protocols were in place, including screening questions prior to the visit, additional usage of staff PPE, and extensive cleaning of exam room while observing appropriate contact time as indicated for disinfecting solutions.   Subjective:   Carol Johnson is a 54 y.o. very pleasant female patient with Body mass index is 40.19 kg/m. who presents with the following:  B hand numbness: Does have a history of prior stroke.  Her left side has been affected.  Over the last 4 weeks she has developed some intermittent right-sided numbness, and this is predominantly when she is lying down in bed.  It does occur at other times as well.  Often occurs when she is lying on her side in bed.  She also can get it when she is doing something monotonous when she is driving as well.  She denies any frank neck pain, shoulder blade pain, elbow pain, pain at the wrist or in the digits.  She has what she describes as global hand numbness and tingling without focal ulnar or medial distribution.  Off and on x 4 week    Review of Systems is noted in the HPI, as appropriate   Objective:   BP 120/74   Pulse 73   Temp 98.3 F (36.8 C) (Temporal)   Ht 5' 2"  (1.575 m)   Wt 219 lb 12 oz (99.7 kg)   LMP 11/29/2015   SpO2 96%   BMI 40.19 kg/m    B elbow Ecchymosis or edema: neg ROM: full flexion, extension, pronation, supination Shoulder ROM: Full Flexion: 5/5 Extension: 5/5 Supination: 5/5    Pronation: 5/5 Wrist ext: 5/5 Wrist flexion: 5/5 No gross bony abnormality Varus and Valgus stress: stable ECRB tenderness: neg Medial epicondyle: NT Lateral epicondyle, resisted wrist extension from wrist full pronation and flexion: NT grip: 5/5 sensation intact Tinel's, Elbow: negative   Hand: B Ecchymosis or edema: neg ROM wrist/hand/digits/elbow: full  Carpals, MCP's, digits: NT Distal Ulna and Radius: NT Finkelstein's test: neg Snuffbox tenderness: neg Scaphoid tubercle: NT Resisted supination: NT Full composite fist Axial load test: neg Phalen's: neg Tinel's: neg  CERVICAL SPINE EXAM Range of motion: Flexion, extension, lateral bending, and rotation: mild loss of motion only Pain with terminal motion: very mild Spinous Processes: NT SCM: NT Upper paracervical muscles: mild ttp Upper traps: NT Spurlings is negative  Radiology: No results found.  Assessment and Plan:     ICD-10-CM   1. Cervical radiculopathy, acute  M54.12   2. Cough  R05.9 albuterol (VENTOLIN HFA) 108 (90 Base) MCG/ACT inhaler  3. Need for influenza vaccination  Z23 Flu Vaccine QUAD 6+ mos PF IM (Fluarix Quad PF)   Bilateral numbness and tingling, by history most likely from cervical radiculopathy.  She has no inducible ulnar neuropathy, carpal tunnel, Guyon's canal impairment.  Spurling's is negative.  Social Determinants of Health  Physical Activity:  -This is somewhat limited secondary to her prior stroke  I made a number  of suggestions including range of motion as well as physical therapy and some oral prednisone.  At this point she would like to be minimally invasive and conservative as at all possible. I reviewed [facility] style rehab from [facility].  Meds ordered this encounter  Medications  . albuterol (VENTOLIN HFA) 108 (90 Base) MCG/ACT inhaler    Sig: Inhale 2 puffs into the lungs every 4 (four) hours as needed for wheezing or shortness of breath (cough, shortness of breath or  wheezing.).    Dispense:  8 g    Refill:  0   Medications Discontinued During This Encounter  Medication Reason  . topiramate (TOPAMAX) 25 MG tablet Completed Course  . saccharomyces boulardii (FLORASTOR) 250 MG capsule Completed Course  . albuterol (PROVENTIL HFA;VENTOLIN HFA) 108 (90 Base) MCG/ACT inhaler Reorder   Orders Placed This Encounter  Procedures  . Flu Vaccine QUAD 6+ mos PF IM (Fluarix Quad PF)    Follow-up: No follow-ups on file.  Signed,  Carol Deed. Hashir Deleeuw, MD   Outpatient Encounter Medications as of 07/07/2020  Medication Sig  . acetaminophen (TYLENOL) 325 MG tablet Take 2 tablets (650 mg total) by mouth every 4 (four) hours as needed for mild pain (or temp > 37.5 C (99.5 F)).  Marland Kitchen albuterol (VENTOLIN HFA) 108 (90 Base) MCG/ACT inhaler Inhale 2 puffs into the lungs every 4 (four) hours as needed for wheezing or shortness of breath (cough, shortness of breath or wheezing.).  Marland Kitchen atorvastatin (LIPITOR) 80 MG tablet Take 80 mg by mouth daily.  Marland Kitchen buPROPion (WELLBUTRIN XL) 300 MG 24 hr tablet Take 1 tablet (300 mg total) by mouth daily.  . citalopram (CELEXA) 20 MG tablet Take 1 tablet (20 mg total) by mouth daily.  . colestipol (COLESTID) 1 g tablet TAKE 2 TABLETS (2 G TOTAL) BY MOUTH DAILY.  Marland Kitchen DERMA-SMOOTHE/FS SCALP 0.01 % OIL Apply 1 a small amount to skin three times a week  Part hair, apply to scalp, cover with plastic cap, wash out in the morning  . fexofenadine (ALLEGRA) 180 MG tablet Take 180 mg by mouth daily as needed.   . fluticasone (FLONASE) 50 MCG/ACT nasal spray Place 2 sprays into both nostrils daily.  Marland Kitchen ketoconazole (NIZORAL) 2 % shampoo Apply 1 application topically 2 (two) times a week.  . loperamide (IMODIUM) 2 MG capsule Take 1 capsule (2 mg total) by mouth as needed for diarrhea or loose stools.  . methocarbamol (ROBAXIN) 500 MG tablet Take 2 tablets (1,000 mg total) by mouth 2 (two) times daily as needed for muscle spasms.  Marland Kitchen olopatadine (PATANOL) 0.1  % ophthalmic solution Place 1 drop into both eyes 2 (two) times daily.  . pantoprazole (PROTONIX) 40 MG tablet Take 1 tablet (40 mg total) by mouth daily.  . rivaroxaban (XARELTO) 20 MG TABS tablet Take 1 tablet (20 mg total) by mouth daily with supper.  . zolpidem (AMBIEN CR) 12.5 MG CR tablet Take 1 tablet (12.5 mg total) by mouth at bedtime as needed. for sleep  . [DISCONTINUED] albuterol (PROVENTIL HFA;VENTOLIN HFA) 108 (90 Base) MCG/ACT inhaler Inhale 2 puffs into the lungs every 4 (four) hours as needed for wheezing or shortness of breath (cough, shortness of breath or wheezing.).  . [DISCONTINUED] saccharomyces boulardii (FLORASTOR) 250 MG capsule Take 1 capsule (250 mg total) by mouth 2 (two) times daily. (Patient not taking: Reported on 02/11/2020)  . [DISCONTINUED] topiramate (TOPAMAX) 25 MG tablet Take 1 tablet (25 mg total) by mouth 2 (two) times daily. (  Patient not taking: Reported on 02/11/2020)   No facility-administered encounter medications on file as of 07/07/2020.

## 2020-07-07 NOTE — Telephone Encounter (Signed)
Per appt notes pt already has appt scheduled 07/07/20 at 2:40 with Dr Lorelei Pont.

## 2020-07-12 ENCOUNTER — Other Ambulatory Visit: Payer: Self-pay | Admitting: Gastroenterology

## 2020-07-18 ENCOUNTER — Other Ambulatory Visit: Payer: Self-pay | Admitting: Physical Medicine & Rehabilitation

## 2020-07-25 ENCOUNTER — Ambulatory Visit: Payer: No Typology Code available for payment source

## 2020-07-25 ENCOUNTER — Other Ambulatory Visit: Payer: Self-pay | Admitting: Gastroenterology

## 2020-07-28 ENCOUNTER — Other Ambulatory Visit: Payer: Self-pay | Admitting: Family Medicine

## 2020-07-29 NOTE — Telephone Encounter (Signed)
Please schedule f/u in 1-2 mo (we will do labs then) and refill until then Thanks

## 2020-07-29 NOTE — Telephone Encounter (Signed)
Med refilled once and Carrie will reach out to pt to try and get appt scheduled  

## 2020-07-29 NOTE — Telephone Encounter (Signed)
Pt hasn't had a lipid lab in over a year. Pt had a recent acute appt with another provider, and last f/u with PCP was 12/03/19 but no lipid labs were done at that time, please advise

## 2020-07-31 ENCOUNTER — Other Ambulatory Visit: Payer: Self-pay | Admitting: Family Medicine

## 2020-07-31 DIAGNOSIS — R059 Cough, unspecified: Secondary | ICD-10-CM

## 2020-08-07 ENCOUNTER — Other Ambulatory Visit: Payer: Self-pay | Admitting: Gastroenterology

## 2020-08-23 IMAGING — MR MR HEAD W/O CM
4 of 6 series · 18 of 48 positions shown · non-contrast
Comparison: Noncontrast head CT 05/23/2019

CLINICAL DATA: Cerebral aneurysm, subarachnoid hemorrhage, cerebral
vasospasm, evaluate left-sided weakness, left facial droop.

EXAM:
MRI HEAD WITHOUT CONTRAST
MRA HEAD WITHOUT CONTRAST
TECHNIQUE: Multiplanar, multiecho pulse sequences of the brain and surrounding
structures were obtained without intravenous contrast. Angiographic
images of the head were obtained using MRA technique without
contrast.

[Series 10: FLAIR · axial · 3.0mm · 0.47mm/px · z∈[-60,+76]mm · 6 of 24 slices shown (1 of 2)]
[im 1/24]
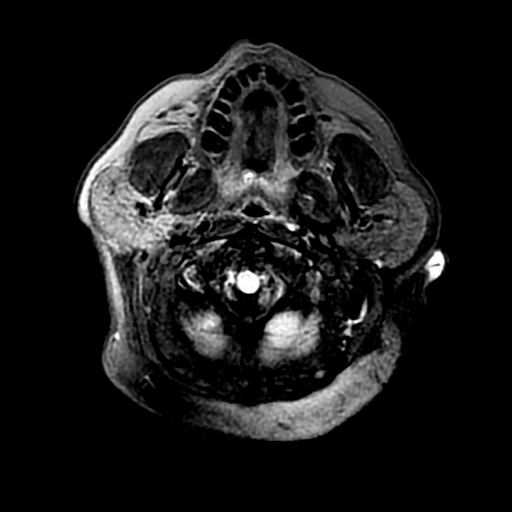
[im 5/24]
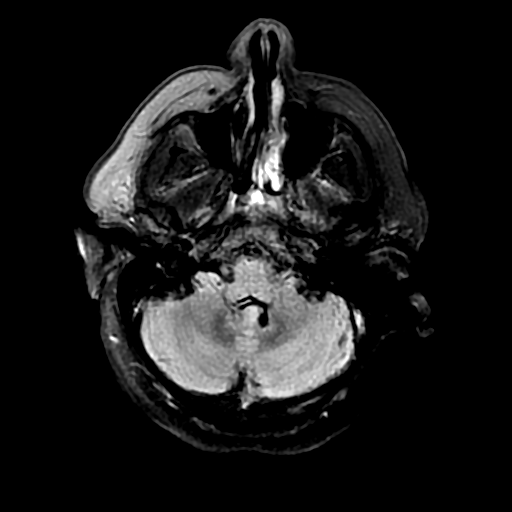
[im 10/24]
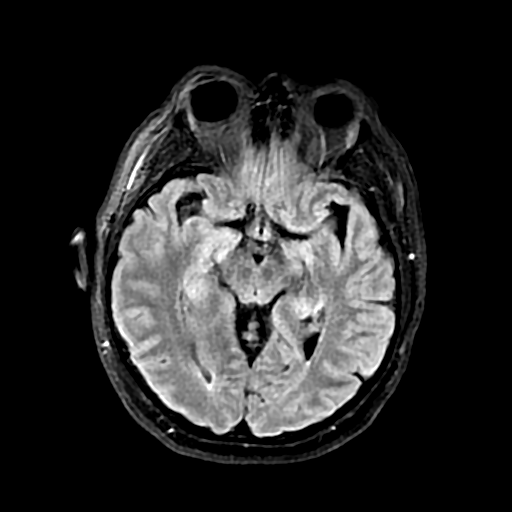
[im 14/24]
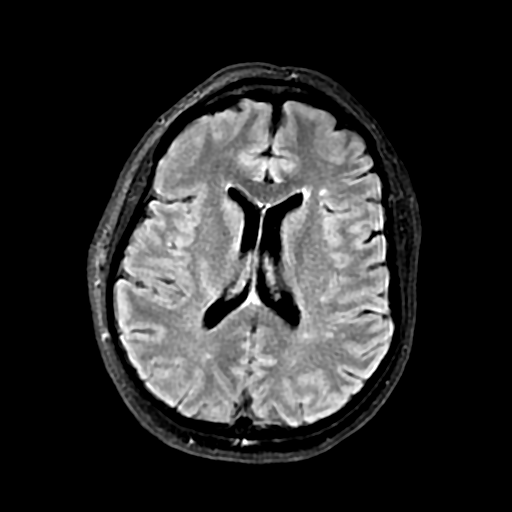
[im 19/24]
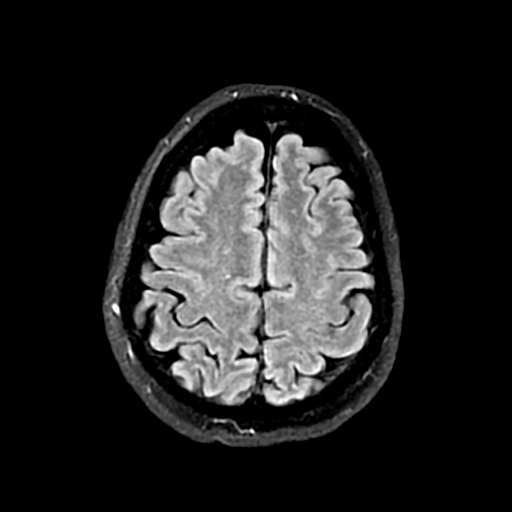
[im 24/24]
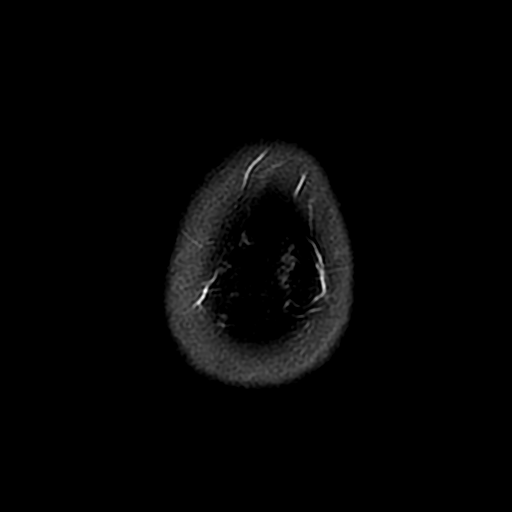

[Series 10: FLAIR · axial · 3.0mm · 0.47mm/px · z∈[-60,+76]mm · 6 of 24 slices shown (2 of 2)]
[im 1/24]
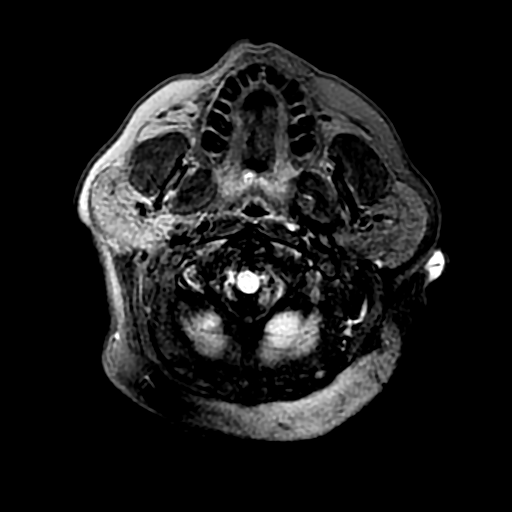
[im 5/24]
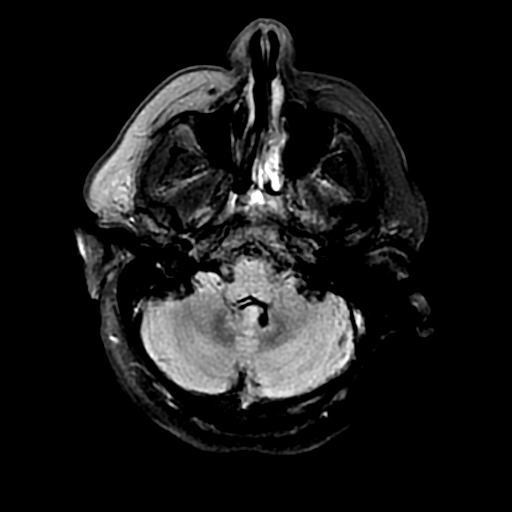
[im 10/24]
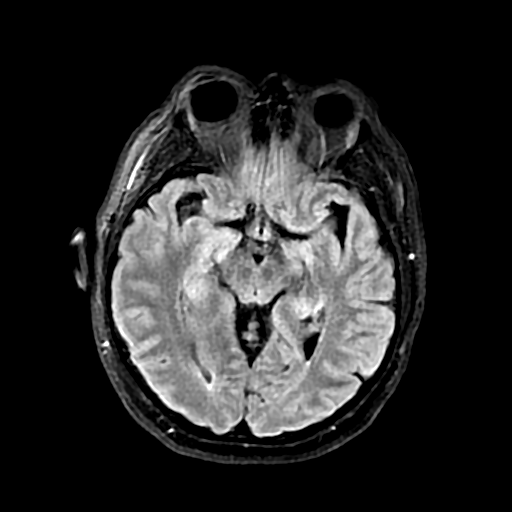
[im 14/24]
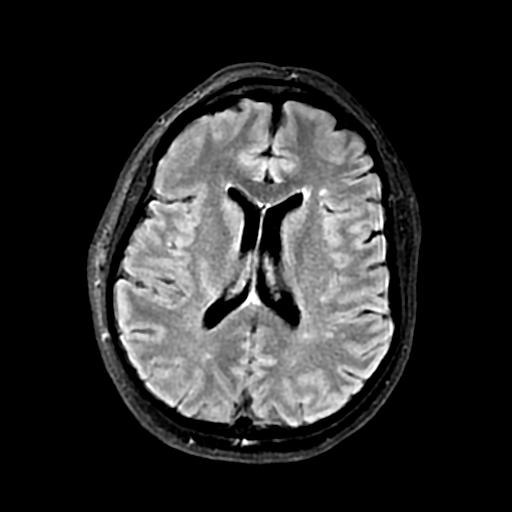
[im 19/24]
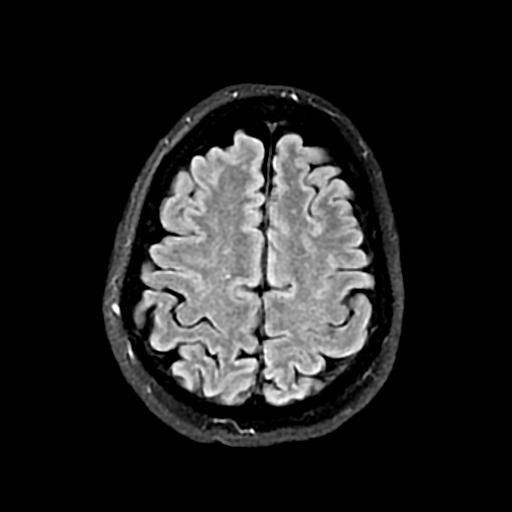
[im 24/24]
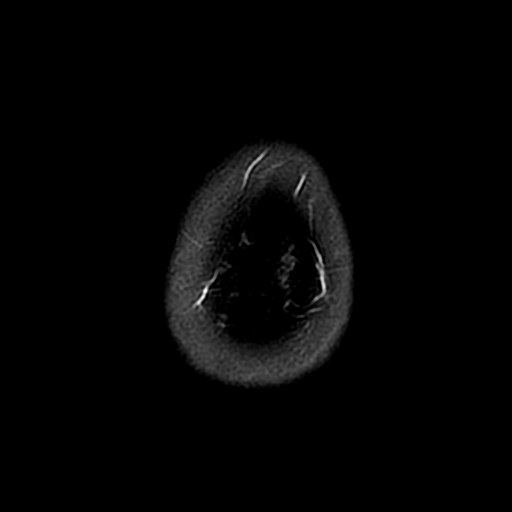

[Series 12: T2 · coronal · 5.0mm · 0.39mm/px · 3 of 26 slices shown (1 of 2)]
[im 6/26]
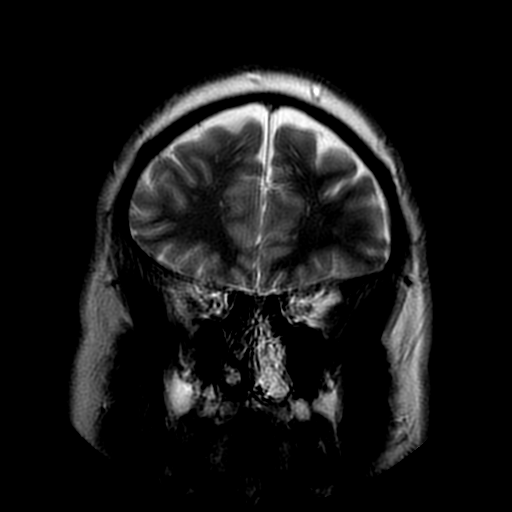
[im 16/26]
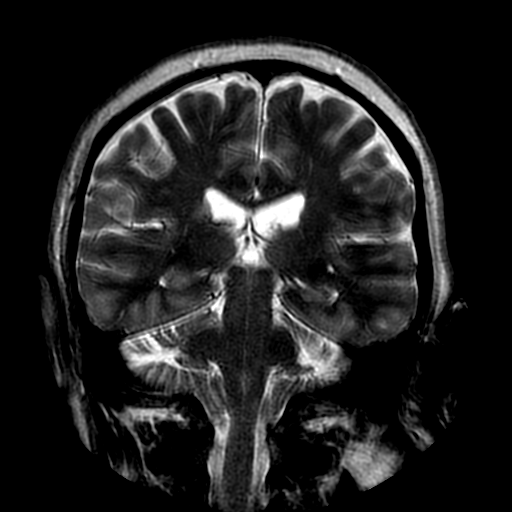
[im 26/26]
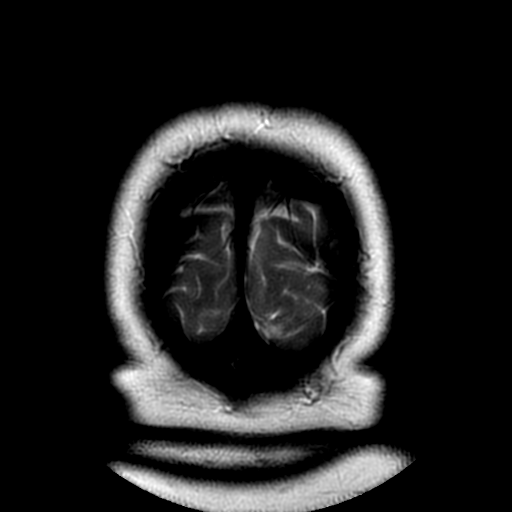

[Series 12: T2 · coronal · 5.0mm · 0.39mm/px · 3 of 26 slices shown (2 of 2)]
[im 6/26]
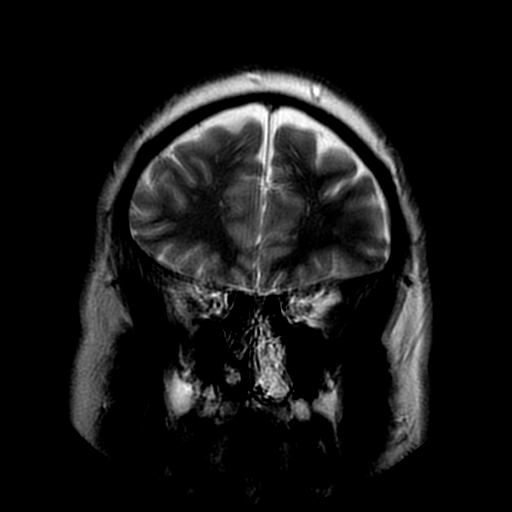
[im 16/26]
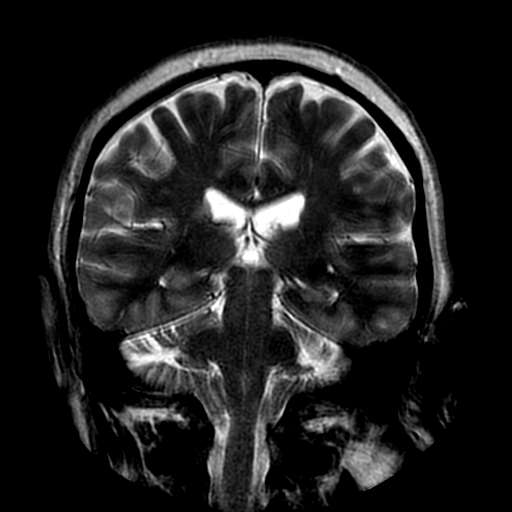
[im 26/26]
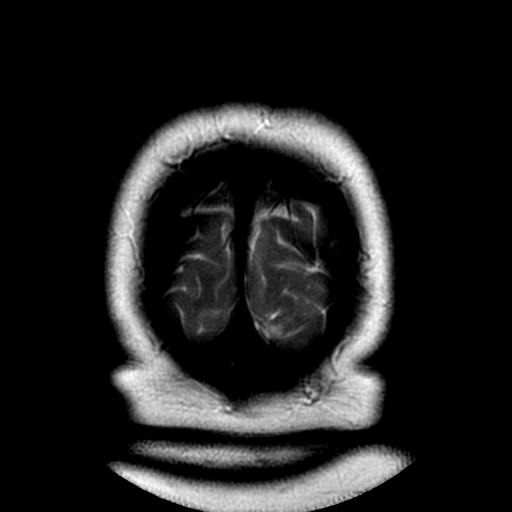

[18 of 48 positions shown; findings below may reference images not displayed]

FINDINGS: MRI HEAD FINDINGS

Brain:

Multiple sequences are motion degraded.

Acute infarct measuring 2.1 x 0.7 cm involving the right corona
radiata/internal capsule and extending inferiorly into the right
basal ganglia. Associated T2/FLAIR hyperintensity at this site.

Mild scattered T2/FLAIR hyperintensity within the cerebral white
matter is nonspecific, but consistent chronic small vessel ischemic
disease.

No evidence of intracranial mass. No midline shift or extra-axial
fluid collection. No chronic intracranial blood products. Cerebral
volume is normal for age. Partially empty sella turcica.

Vascular: Reported separately

Skull and upper cervical spine: Normal marrow signal.

Sinuses/Orbits: Open orbits visualized paranasal sinuses are clear.
Visualized mastoid air cells are clear.

MRA HEAD FINDINGS

The examination is motion degraded.

Bilateral ICA siphons patent without high-grade stenosis.

Right middle and anterior cerebral arteries patent without
high-grade proximal stenosis.

Left middle and anterior cerebral arteries patent without high-grade
proximal stenosis.

Possible 2 mm aneurysm arising from the A2 right anterior cerebral
artery on source images (series 3, image 61). However, evaluation is
somewhat limited due to motion artifact.

Visualized intracranial vertebral arteries are patent without
high-grade stenosis.

Basilar artery is patent without high-grade stenosis.

Bilateral posterior cerebral arteries patent without high-grade
proximal stenosis.

A right posterior communicating artery is present. Left posterior
communicating is poorly delineated and may be hypoplastic or absent.

These results were called by telephone at the time of interpretation
on 05/23/2019 at [DATE] to provider Dr. Bethel, Who verbally
acknowledged these results.
IMPRESSION: MRI brain:

1. Motion degraded examination.
2. 2.1 x 0.7 cm acute infarct involving right corona
radiata/internal capsule and extending into the right basal ganglia.
3. Mild chronic small vessel ischemic disease.

MRA head:

1. Motion degraded examination.
2. No evidence of intracranial large vessel occlusion or high-grade
proximal arterial stenosis.
3. A 2 mm aneurysm arising the A2 right anterior cerebral artery is
questioned, although evaluation is somewhat limited due to motion
degradation. Consider follow-up CT or MR angiography.

## 2020-08-23 IMAGING — MR MR MRA HEAD W/O CM
18 of 24 series · 27 of 48 positions shown · non-contrast
Comparison: Noncontrast head CT 05/23/2019

CLINICAL DATA: Cerebral aneurysm, subarachnoid hemorrhage, cerebral
vasospasm, evaluate left-sided weakness, left facial droop.

EXAM:
MRI HEAD WITHOUT CONTRAST
MRA HEAD WITHOUT CONTRAST
TECHNIQUE: Multiplanar, multiecho pulse sequences of the brain and surrounding
structures were obtained without intravenous contrast. Angiographic
images of the head were obtained using MRA technique without
contrast.

[Series 2: DWI · axial · 3.0mm · 0.94mm/px · z∈[-61,+78]mm · 2 of 96 slices shown (1 of 4)]
[im 1/96]
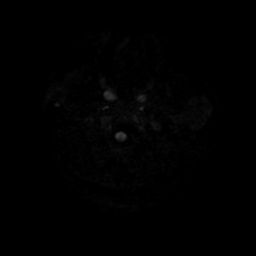
[im 96/96]
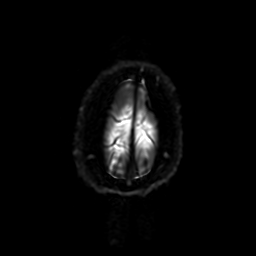

[Series 2: DWI · axial · 3.0mm · 0.94mm/px · z∈[-61,+78]mm · 2 of 96 slices shown (2 of 4)]
[im 1/96]
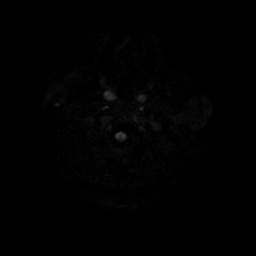
[im 96/96]
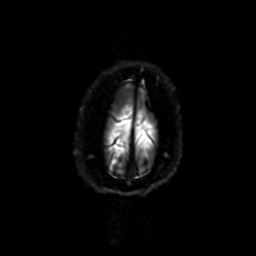

[Series 4: DWI · coronal · 4.0mm · 0.94mm/px · 2 of 62 slices shown (3 of 4)]
[im 1/62]
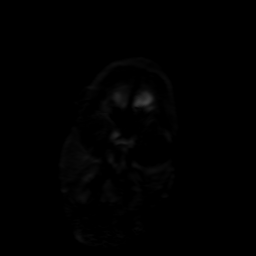
[im 62/62]
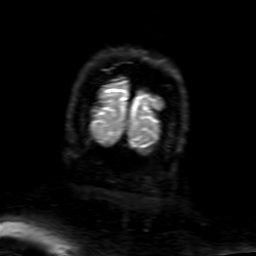

[Series 4: DWI · coronal · 4.0mm · 0.94mm/px · 2 of 62 slices shown (4 of 4)]
[im 1/62]
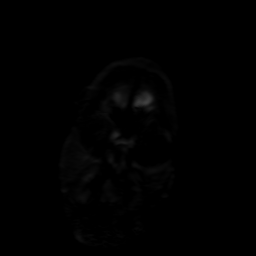
[im 62/62]
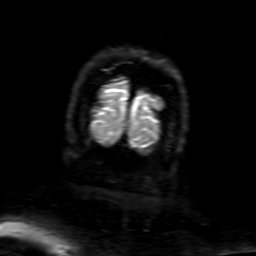

[Series 6: T2 · axial · 5.0mm · 0.47mm/px · 1 of 24 slices shown (1 of 4)]
[im 1/24]
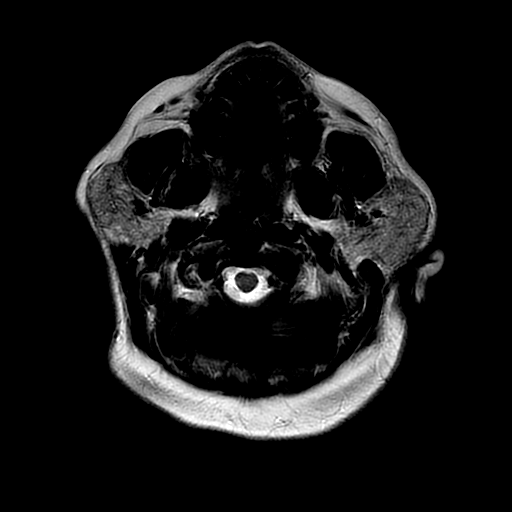

[Series 6: T2 · axial · 5.0mm · 0.47mm/px · 1 of 24 slices shown (2 of 4)]
[im 1/24]
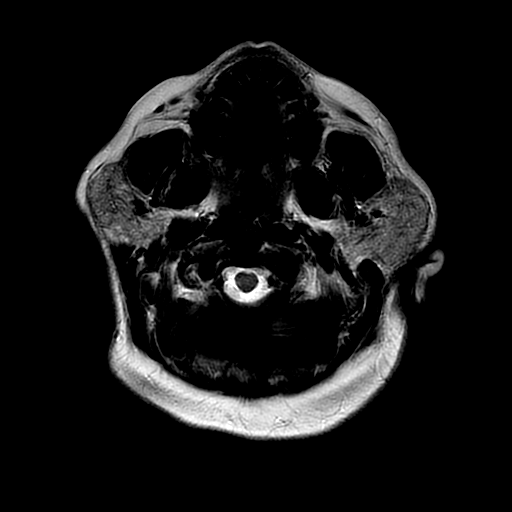

[Series 7: FLAIR · sagittal · 5.0mm · 0.47mm/px · 1 of 23 slices shown (1 of 4)]
[im 1/23]
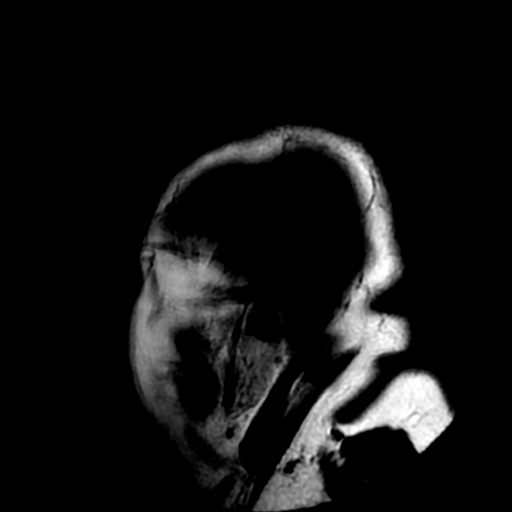

[Series 7: FLAIR · sagittal · 5.0mm · 0.47mm/px · 1 of 23 slices shown (2 of 4)]
[im 1/23]
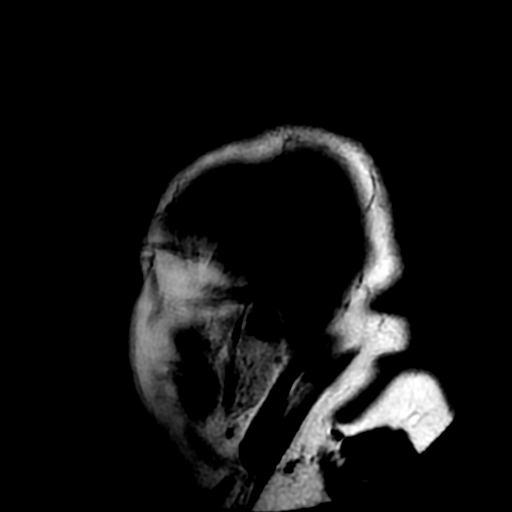

[Series 8: SWI · axial · 3.0mm · 0.47mm/px · z∈[-61,+79]mm · 3 of 96 slices shown (1 of 2)]
[im 1/96]
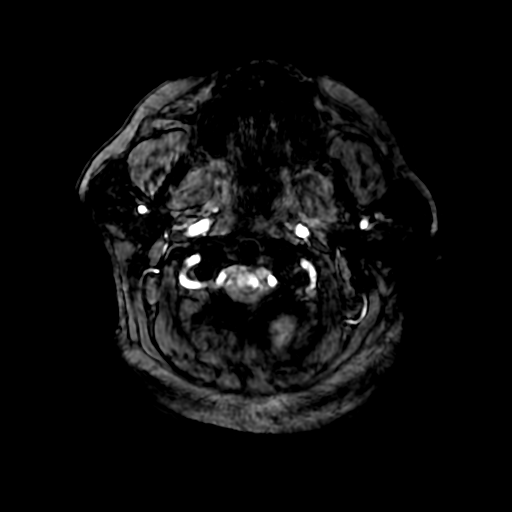
[im 48/96]
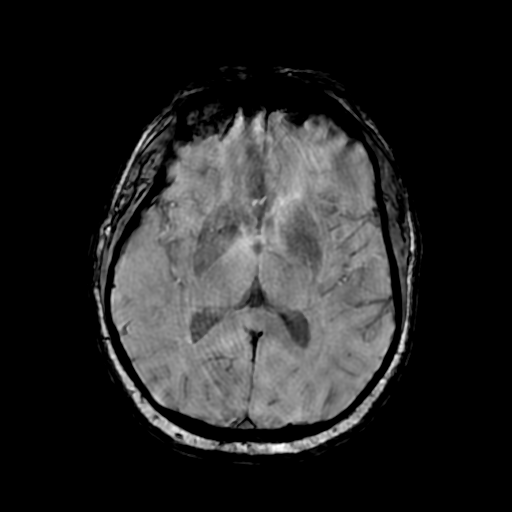
[im 96/96]
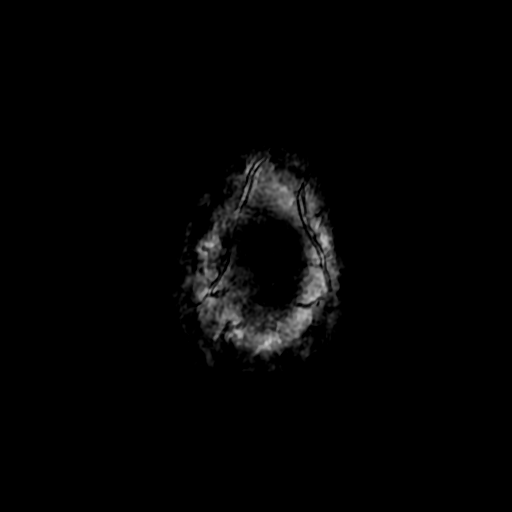

[Series 8: SWI · axial · 3.0mm · 0.47mm/px · z∈[-61,+8]mm · 2 of 96 slices shown (2 of 2)]
[im 1/96]
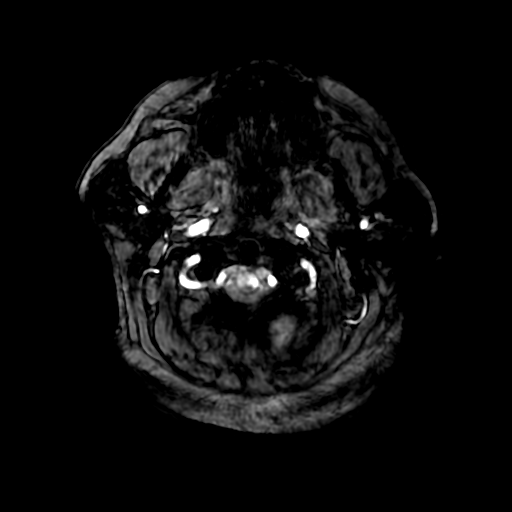
[im 48/96]
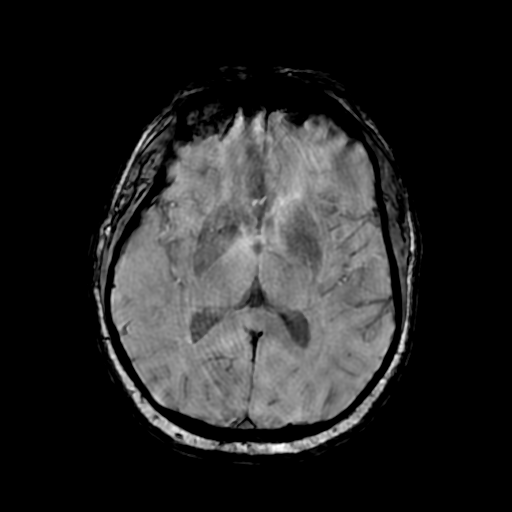

[Series 10: FLAIR · axial · 3.0mm · 0.47mm/px · 1 of 24 slices shown (3 of 4)]
[im 1/24]
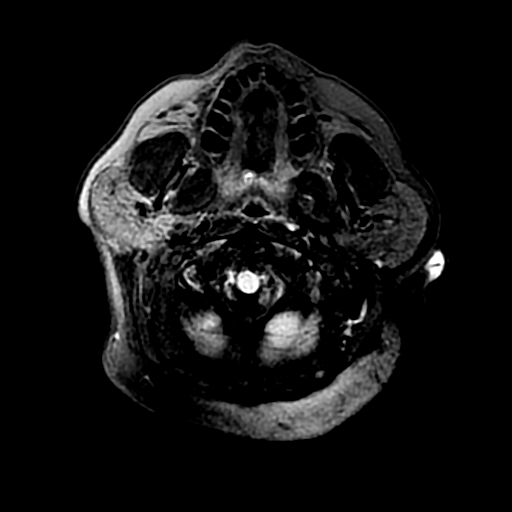

[Series 10: FLAIR · axial · 3.0mm · 0.47mm/px · 1 of 24 slices shown (4 of 4)]
[im 1/24]
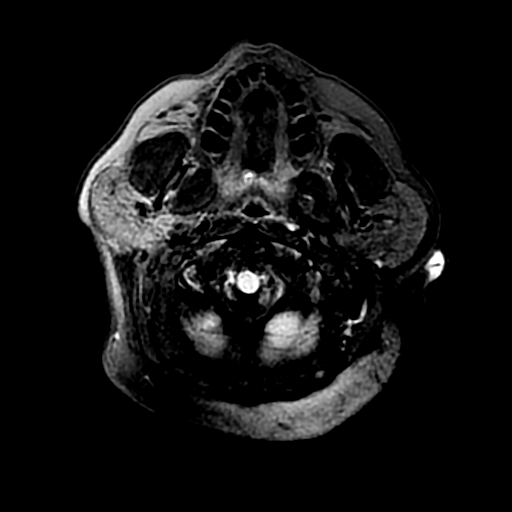

[Series 12: T2 · coronal · 5.0mm · 0.39mm/px · 1 of 26 slices shown (3 of 4)]
[im 1/26]
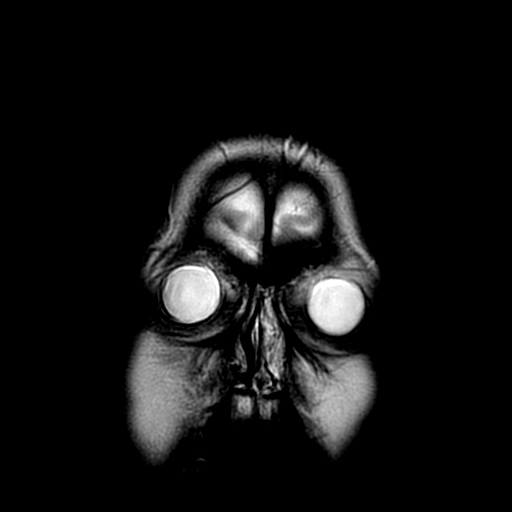

[Series 12: T2 · coronal · 5.0mm · 0.39mm/px · 1 of 26 slices shown (4 of 4)]
[im 1/26]
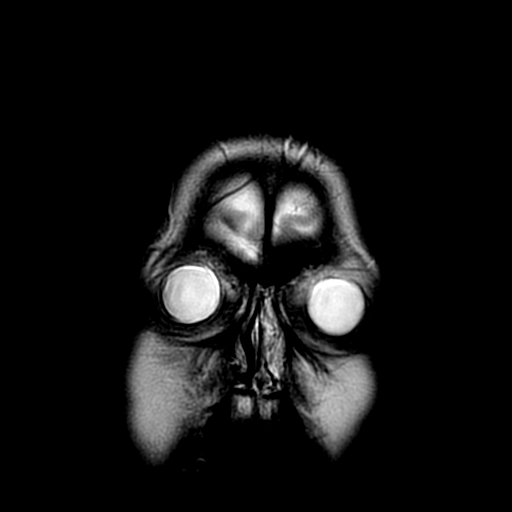

[Series 250: ADC · axial · 3.0mm · 0.94mm/px · z∈[-61,+78]mm · 2 of 48 slices shown (1 of 4)]
[im 1/48]
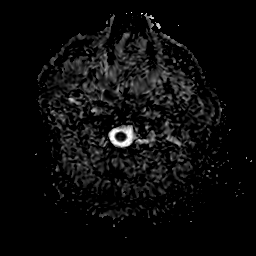
[im 48/48]
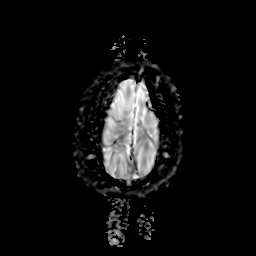

[Series 250: ADC · axial · 3.0mm · 0.94mm/px · z∈[-61,+78]mm · 2 of 48 slices shown (2 of 4)]
[im 1/48]
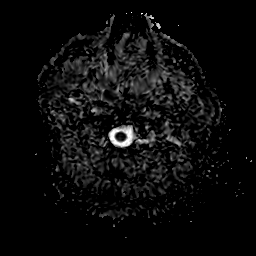
[im 48/48]
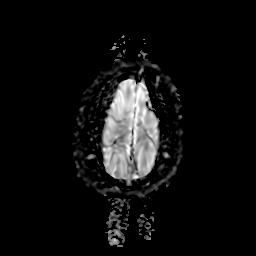

[Series 450: ADC · coronal · 4.0mm · 0.94mm/px · 1 of 31 slices shown (3 of 4)]
[im 1/31]
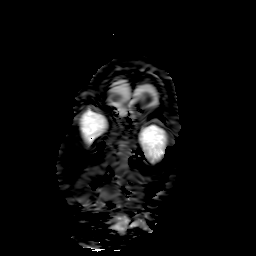

[Series 450: ADC · coronal · 4.0mm · 0.94mm/px · 1 of 31 slices shown (4 of 4)]
[im 1/31]
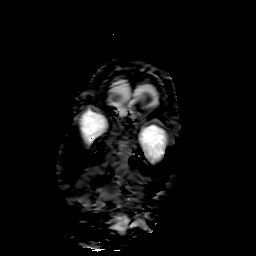

[27 of 48 positions shown; findings below may reference images not displayed]

FINDINGS: MRI HEAD FINDINGS

Brain:

Multiple sequences are motion degraded.

Acute infarct measuring 2.1 x 0.7 cm involving the right corona
radiata/internal capsule and extending inferiorly into the right
basal ganglia. Associated T2/FLAIR hyperintensity at this site.

Mild scattered T2/FLAIR hyperintensity within the cerebral white
matter is nonspecific, but consistent chronic small vessel ischemic
disease.

No evidence of intracranial mass. No midline shift or extra-axial
fluid collection. No chronic intracranial blood products. Cerebral
volume is normal for age. Partially empty sella turcica.

Vascular: Reported separately

Skull and upper cervical spine: Normal marrow signal.

Sinuses/Orbits: Open orbits visualized paranasal sinuses are clear.
Visualized mastoid air cells are clear.

MRA HEAD FINDINGS

The examination is motion degraded.

Bilateral ICA siphons patent without high-grade stenosis.

Right middle and anterior cerebral arteries patent without
high-grade proximal stenosis.

Left middle and anterior cerebral arteries patent without high-grade
proximal stenosis.

Possible 2 mm aneurysm arising from the A2 right anterior cerebral
artery on source images (series 3, image 61). However, evaluation is
somewhat limited due to motion artifact.

Visualized intracranial vertebral arteries are patent without
high-grade stenosis.

Basilar artery is patent without high-grade stenosis.

Bilateral posterior cerebral arteries patent without high-grade
proximal stenosis.

A right posterior communicating artery is present. Left posterior
communicating is poorly delineated and may be hypoplastic or absent.

These results were called by telephone at the time of interpretation
on 05/23/2019 at [DATE] to provider Dr. Bethel, Who verbally
acknowledged these results.
IMPRESSION: MRI brain:

1. Motion degraded examination.
2. 2.1 x 0.7 cm acute infarct involving right corona
radiata/internal capsule and extending into the right basal ganglia.
3. Mild chronic small vessel ischemic disease.

MRA head:

1. Motion degraded examination.
2. No evidence of intracranial large vessel occlusion or high-grade
proximal arterial stenosis.
3. A 2 mm aneurysm arising the A2 right anterior cerebral artery is
questioned, although evaluation is somewhat limited due to motion
degradation. Consider follow-up CT or MR angiography.

## 2020-09-03 ENCOUNTER — Other Ambulatory Visit: Payer: Self-pay | Admitting: Family

## 2020-09-03 DIAGNOSIS — J069 Acute upper respiratory infection, unspecified: Secondary | ICD-10-CM

## 2020-09-06 ENCOUNTER — Other Ambulatory Visit: Payer: Self-pay | Admitting: Gastroenterology

## 2020-09-10 ENCOUNTER — Encounter: Payer: Self-pay | Admitting: Family Medicine

## 2020-09-19 ENCOUNTER — Encounter: Payer: Self-pay | Admitting: Family Medicine

## 2020-09-19 ENCOUNTER — Telehealth (INDEPENDENT_AMBULATORY_CARE_PROVIDER_SITE_OTHER): Payer: No Typology Code available for payment source | Admitting: Family Medicine

## 2020-09-19 ENCOUNTER — Other Ambulatory Visit: Payer: No Typology Code available for payment source

## 2020-09-19 ENCOUNTER — Other Ambulatory Visit: Payer: Self-pay

## 2020-09-19 ENCOUNTER — Other Ambulatory Visit: Payer: Self-pay | Admitting: Family Medicine

## 2020-09-19 DIAGNOSIS — J069 Acute upper respiratory infection, unspecified: Secondary | ICD-10-CM

## 2020-09-19 DIAGNOSIS — I10 Essential (primary) hypertension: Secondary | ICD-10-CM

## 2020-09-19 DIAGNOSIS — E782 Mixed hyperlipidemia: Secondary | ICD-10-CM

## 2020-09-19 DIAGNOSIS — E538 Deficiency of other specified B group vitamins: Secondary | ICD-10-CM

## 2020-09-19 DIAGNOSIS — J22 Unspecified acute lower respiratory infection: Secondary | ICD-10-CM | POA: Insufficient documentation

## 2020-09-19 NOTE — Progress Notes (Signed)
Virtual Visit via Video Note  I connected with Carol Johnson on 09/19/20 at  8:00 AM EST by a video enabled telemedicine application and verified that I am speaking with the correct person using two identifiers.  Location: Patient: home Provider: office   I discussed the limitations of evaluation and management by telemedicine and the availability of in person appointments. The patient expressed understanding and agreed to proceed.  Parties involved in encounter  Patient: Estate manager/land Johnson  Provider:  Loura Pardon MD   History of Present Illness: Pt presents for f/u of chronic health problems and uri symptoms   In the last few weeks had some symptoms of covid  Mother had it  (was exposed to her)  Has been 2 weeks at this point   Jan 3-8th week  Cold symptoms  Sneezing and cough  Watery eyes and headache  Then sinus pressure/congestion and drainage  Then sore throat (pretty bad for a day)-had a fever that night  Tired  Feeling better now   She stayed out of work Still tired   Took sudafed and tylenol   Some issues with her colitis- trying to get a GI appt    HTN  (in the setting of cva hx)  No medications at this time  No problems with bp  occ tingling of hands at night  No new stroke symptoms   Hyperlipidemia  On atorvastatin 80 mg daily  Lab Results  Component Value Date   CHOL 129 06/29/2019   HDL 30 (L) 06/29/2019   LDLCALC 80 06/29/2019   LDLDIRECT 193.0 08/04/2017   TRIG 103 06/29/2019   CHOLHDL 4.3 06/29/2019   hypercoag state with past dvt and CVA Takes xarelto   Mood -pretty good , continues celexa wellbutrin   Lab Results  Component Value Date   VITAMINB12 382 04/06/2019    Due for labs   Patient Active Problem List   Diagnosis Date Noted  . Viral URI with cough 09/19/2020  . Sinus congestion 01/12/2020  . Hypercoagulable state ([place]) 12/18/2019  . Hair loss 12/03/2019  . Aneurysm of anterior cerebral artery 12/03/2019  . Abnormality of  gait 09/04/2019  . Hemiparesis affecting nondominant side as late effect of stroke ([place]) 07/24/2019  . Muscle weakness (generalized) 06/11/2019  . Hemiplegia and hemiparesis following cerebral infarction affecting left non-dominant side ([place]) 06/11/2019  . Major depressive disorder   . Hypokalemia   . Acute blood loss anemia   . Ulcerative colitis with complication ([place])   . H/O: CVA (cerebrovascular accident) 05/23/2019  . Thrombocytosis 05/23/2019  . Coronary artery disease involving native coronary artery of native heart without angina pectoris 03/21/2018  . Coronary artery calcification 02/10/2018  . Obesity (BMI 30-39.9) 02/10/2018  . Former smoker 01/15/2018  . Dyspnea on exertion 12/21/2017  . Carpal tunnel syndrome 12/21/2017  . Stress incontinence 12/21/2017  . Paresthesia of both feet 07/04/2017  . Always thirsty 07/04/2017  . Abdominal bloating 12/01/2015  . Fatigue 12/01/2015  . Vitamin B12 deficiency 12/01/2015  . Lymphocytic colitis 01/16/2015  . Anemia, iron deficiency 01/16/2015  . Anxiety state 12/20/2014  . Diarrhea 11/27/2014  . Insomnia 11/27/2014  . History of DVT (deep vein thrombosis) 09/17/2014  . OSA (obstructive sleep apnea) 09/02/2014  . Gastritis, chronic 06/06/2014  . Unspecified gastritis and gastroduodenitis without mention of hemorrhage 05/03/2014  . Abdominal pain, epigastric 04/19/2014  . Internal hemorrhoids with other complication 46/27/0350  . Routine general medical examination at a health care facility 10/14/2011  .  Thyroid nodule 05/16/2011  . Rectal bleeding 11/24/2010  . Hemorrhoids, internal 11/24/2010  . Irritable bowel syndrome (IBS) 11/24/2010  . ANEMIA 10/02/2009  . Hyperlipidemia 07/18/2009  . Essential hypertension 07/18/2009  . DYSPNEA ON EXERTION 07/18/2009  . THYROID CYST 01/31/2008  . ANXIETY 01/31/2008  . Depression with anxiety 01/31/2008  . GASTRIC ULCER, HX OF 01/31/2008  . FIBROIDS, UTERUS 11/13/2007  . ALLERGIC  RHINITIS 11/13/2007  . Asthma 11/13/2007  . GERD 11/13/2007  . IRRITABLE BOWEL SYNDROME 11/13/2007  . INTERNAL HEMORRHOIDS 11/12/2003   Past Medical History:  Diagnosis Date  . Abdominal pain, unspecified site   . Allergic rhinitis   . Anxiety   . Asthma   . Depression   . DVT (deep venous thrombosis) ([place]) 2016  . Fibroid uterus   . GERD (gastroesophageal reflux disease)   . Hypercholesterolemia   . Hypertension   . Internal hemorrhoid   . Irritable bowel syndrome   . Lymphocytic colitis 01/02/2015  . Pneumonia 1998  . Stroke ([place])   . Ulcerative colitis ([place])   . URI (upper respiratory infection)   . Uterine fibroid   . Vitamin B12 deficiency    Past Surgical History:  Procedure Laterality Date  . CESAREAN SECTION  2004  . CHOLECYSTECTOMY     laparoscopic "gallstones"  . ESOPHAGOGASTRODUODENOSCOPY (EGD) WITH PROPOFOL N/A 05/03/2014   Procedure: ESOPHAGOGASTRODUODENOSCOPY (EGD) WITH PROPOFOL;  Surgeon: Inda Castle, MD;  Location: [facility];  Service: Endoscopy;  Laterality: N/A;  . MYOMECTOMY    . UTERINE FIBROID SURGERY     Social History   Tobacco Use  . Smoking status: Former Smoker    Packs/day: 0.10    Years: 10.00    Pack years: 1.00    Types: Cigarettes    Quit date: 12/07/2017    Years since quitting: 2.7  . Smokeless tobacco: Never Used  Vaping Use  . Vaping Use: Never used  Substance Use Topics  . Alcohol use: Not Currently    Alcohol/week: 5.0 standard drinks    Types: 5 Cans of beer per week  . Drug use: No   Family History  Problem Relation Age of Onset  . Pulmonary fibrosis Father   . Colon cancer Paternal Aunt   . Uterine cancer Paternal Grandmother        with mets to the colon   . Heart failure Maternal Grandmother   . Heart disease Paternal Uncle   . Heart disease Paternal Aunt   . Kidney disease Cousin   . Hyperlipidemia Mother   . Mitral valve prolapse Mother   . Heart disease Mother        arrhythmia   Allergies   Allergen Reactions  . Zoloft [Sertraline Hcl] Diarrhea   Current Outpatient Medications on File Prior to Visit  Medication Sig Dispense Refill  . acetaminophen (TYLENOL) 325 MG tablet Take 2 tablets (650 mg total) by mouth every 4 (four) hours as needed for mild pain (or temp > 37.5 C (99.5 F)).    Marland Kitchen albuterol (VENTOLIN HFA) 108 (90 Base) MCG/ACT inhaler INHALE 2 PUFFS INTO THE LUNGS EVERY 4 (FOUR) HOURS AS NEEDED FOR WHEEZING, COUGH, OR SHORTNESS OF BREATH 6.7 each 3  . atorvastatin (LIPITOR) 80 MG tablet TAKE 1 TABLET (80 MG TOTAL) BY MOUTH DAILY AT 6 PM. 90 tablet 0  . buPROPion (WELLBUTRIN XL) 300 MG 24 hr tablet Take 1 tablet (300 mg total) by mouth daily. 90 tablet 3  . citalopram (CELEXA) 20 MG tablet  Take 1 tablet (20 mg total) by mouth daily. 90 tablet 3  . colestipol (COLESTID) 1 g tablet TAKE 2 TABLETS (2 G TOTAL) BY MOUTH DAILY. 30 tablet 2  . DERMA-SMOOTHE/FS SCALP 0.01 % OIL Apply 1 a small amount to skin three times a week  Part hair, apply to scalp, cover with plastic cap, wash out in the morning    . fexofenadine (ALLEGRA) 180 MG tablet Take 180 mg by mouth daily as needed.     . fluticasone (FLONASE) 50 MCG/ACT nasal spray Place 2 sprays into both nostrils daily. 16 g 6  . ketoconazole (NIZORAL) 2 % shampoo Apply 1 application topically 2 (two) times a week.    . loperamide (IMODIUM) 2 MG capsule Take 1 capsule (2 mg total) by mouth as needed for diarrhea or loose stools. 30 capsule 0  . methocarbamol (ROBAXIN) 500 MG tablet TAKE 2 TABLETS (1,000 MG TOTAL) BY MOUTH 2 (TWO) TIMES DAILY AS NEEDED FOR MUSCLE SPASMS. 120 tablet 2  . olopatadine (PATANOL) 0.1 % ophthalmic solution Place 1 drop into both eyes 2 (two) times daily. 5 mL 11  . pantoprazole (PROTONIX) 40 MG tablet Take 1 tablet (40 mg total) by mouth daily. 90 tablet 3  . rivaroxaban (XARELTO) 20 MG TABS tablet Take 1 tablet (20 mg total) by mouth daily with supper. 90 tablet 3  . zolpidem (AMBIEN CR) 12.5 MG CR tablet  Take 1 tablet (12.5 mg total) by mouth at bedtime as needed. for sleep 30 tablet 3   No current facility-administered medications on file prior to visit.   Review of Systems  Constitutional: Negative for chills, fever and malaise/fatigue.       Fatigue  HENT: Positive for congestion and sinus pain. Negative for ear pain and sore throat.   Eyes: Negative for blurred vision, discharge and redness.  Respiratory: Negative for cough, shortness of breath, wheezing and stridor.   Cardiovascular: Negative for chest pain, palpitations and leg swelling.  Gastrointestinal: Negative for abdominal pain, diarrhea, nausea and vomiting.  Musculoskeletal: Negative for myalgias.  Skin: Negative for rash.  Neurological: Positive for headaches. Negative for dizziness.     Observations/Objective: Patient appears well, in no distress Weight is baseline  No facial swelling or asymmetry Normal voice-not hoarse and no slurred speech No obvious tremor or mobility impairment Moving neck and UEs normally Able to hear the call well  No cough or shortness of breath during interview  Talkative and mentally sharp with no cognitive changes No skin changes on face or neck , no rash or pallor Affect is normal    Assessment and Plan: Problem List Items Addressed This Visit      Cardiovascular and Mediastinum   Essential hypertension    Per pt, bp has been stable , no medications needed at this time  Labs due-order done  Disc imp of healthy lifestyle/ limit sodium/exercise as tolerated        Respiratory   Viral URI with cough    Fairly mild early this month and better now  Watch for s/s of bacterial sinusitis  Fluids and rest        Other   Hyperlipidemia - Primary    In setting of past cva  Taking atorvastatin 80 mg daily  Disc goals for lipids and reasons to control them Rev last labs with pt Rev low sat fat diet in detail Due for labs-orders put in       Vitamin B12 deficiency  Due for  level           Follow Up Instructions: Drink fluids and rest  If cold symptoms worsen or you develop more sinus issues let us know   Call to set up a fasting lab appointment    I discussed the assessment and treatment plan with the patient. The patient was provided an opportunity to ask questions and all were answered. The patient agreed with the plan and demonstrated an understanding of the instructions.   The patient was advised to call back or seek an in-person evaluation if the symptoms worsen or if the condition fails to improve as anticipated.    Loura Pardon, MD

## 2020-09-21 NOTE — Assessment & Plan Note (Signed)
In setting of past cva  Taking atorvastatin 80 mg daily  Disc goals for lipids and reasons to control them Rev last labs with pt Rev low sat fat diet in detail Due for labs-orders put in

## 2020-09-21 NOTE — Assessment & Plan Note (Signed)
Fairly mild early this month and better now  Watch for s/s of bacterial sinusitis  Fluids and rest

## 2020-09-21 NOTE — Assessment & Plan Note (Signed)
Per pt, bp has been stable , no medications needed at this time  Labs due-order done  Disc imp of healthy lifestyle/ limit sodium/exercise as tolerated

## 2020-09-21 NOTE — Assessment & Plan Note (Signed)
Due for level

## 2020-09-21 NOTE — Patient Instructions (Signed)
Drink fluids and rest  If cold symptoms worsen or you develop more sinus issues let us know   Call to set up a fasting lab appointment

## 2020-09-23 ENCOUNTER — Other Ambulatory Visit: Payer: Self-pay | Admitting: Gastroenterology

## 2020-10-06 ENCOUNTER — Other Ambulatory Visit: Payer: Self-pay | Admitting: Family

## 2020-10-06 ENCOUNTER — Other Ambulatory Visit: Payer: Self-pay | Admitting: Family Medicine

## 2020-10-06 DIAGNOSIS — J069 Acute upper respiratory infection, unspecified: Secondary | ICD-10-CM

## 2020-10-07 ENCOUNTER — Other Ambulatory Visit: Payer: Self-pay | Admitting: Gastroenterology

## 2020-10-08 ENCOUNTER — Encounter: Payer: Self-pay | Admitting: Family Medicine

## 2020-10-08 MED ORDER — PANTOPRAZOLE SODIUM 40 MG PO TBEC: 40.0000 mg | DELAYED_RELEASE_TABLET | Freq: Every day | ORAL | 3 refills | Status: DC

## 2020-10-08 NOTE — Telephone Encounter (Signed)
See mychart, ?? If PCP fills med or GI, looks like we didn't fill Rx last time

## 2020-10-09 ENCOUNTER — Telehealth: Payer: Self-pay | Admitting: Gastroenterology

## 2020-10-09 NOTE — Telephone Encounter (Signed)
Patient is requesting pain medication to help her until her appointment

## 2020-10-10 NOTE — Telephone Encounter (Signed)
Ok, thank you

## 2020-10-10 NOTE — Telephone Encounter (Signed)
Yes, agree given her h/o CVA, she will need further evaluation to exclude recurrent stroke. Will also need to exclude Acute diverticulitis or acute bleed. She is on chronic anticoagulation with Xarelto. She will need to go to ER for evaluation. Thanks

## 2020-10-10 NOTE — Telephone Encounter (Signed)
Called patient. No answer. No voicemail.

## 2020-10-10 NOTE — Telephone Encounter (Signed)
Patient contacted on the phone. Pain in her middle lower abdomen that comes in waves but does not go away. Having a bowel movement will dull it temporarily. She states 5 bowel movements today described as soft and always with red blood.  Discussed possibility of diverticulitis, acute bleed and recurrent stroke with the symptoms she is describing. She agrees to go to the ED for evaluation.

## 2020-10-10 NOTE — Telephone Encounter (Signed)
Patient communicating through My Chart patient advise request.  She is complaining of lower middle abdominal pain, blood in her stool, dizziness and confusion.  She tells me the abdominal and bowels symptoms have been off and on "for a while." The confusion and dizziness are new. I have asked her to reach out to PCP or neuro with her hx of stroke. Should I send her to the ER? Please advise.

## 2020-10-16 ENCOUNTER — Other Ambulatory Visit (INDEPENDENT_AMBULATORY_CARE_PROVIDER_SITE_OTHER): Payer: No Typology Code available for payment source

## 2020-10-16 ENCOUNTER — Ambulatory Visit: Payer: No Typology Code available for payment source | Admitting: Nurse Practitioner

## 2020-10-16 ENCOUNTER — Encounter: Payer: Self-pay | Admitting: Nurse Practitioner

## 2020-10-16 VITALS — BP 118/72 | HR 78 | Ht 61.5 in | Wt 232.0 lb

## 2020-10-16 DIAGNOSIS — R197 Diarrhea, unspecified: Secondary | ICD-10-CM

## 2020-10-16 DIAGNOSIS — K648 Other hemorrhoids: Secondary | ICD-10-CM

## 2020-10-16 DIAGNOSIS — K52832 Lymphocytic colitis: Secondary | ICD-10-CM

## 2020-10-16 DIAGNOSIS — K625 Hemorrhage of anus and rectum: Secondary | ICD-10-CM

## 2020-10-16 LAB — CBC WITH DIFFERENTIAL/PLATELET
Basophils Absolute: 0.1 10*3/uL (ref 0.0–0.1)
Basophils Relative: 1.2 % (ref 0.0–3.0)
Eosinophils Absolute: 0.5 10*3/uL (ref 0.0–0.7)
Eosinophils Relative: 5.4 % — ABNORMAL HIGH (ref 0.0–5.0)
HCT: 36.9 % (ref 36.0–46.0)
Hemoglobin: 12.2 g/dL (ref 12.0–15.0)
Lymphocytes Relative: 24.3 % (ref 12.0–46.0)
Lymphs Abs: 2.1 10*3/uL (ref 0.7–4.0)
MCHC: 33 g/dL (ref 30.0–36.0)
MCV: 77.5 fl — ABNORMAL LOW (ref 78.0–100.0)
Monocytes Absolute: 0.6 10*3/uL (ref 0.1–1.0)
Monocytes Relative: 7.1 % (ref 3.0–12.0)
Neutro Abs: 5.5 10*3/uL (ref 1.4–7.7)
Neutrophils Relative %: 62 % (ref 43.0–77.0)
Platelets: 357 10*3/uL (ref 150.0–400.0)
RBC: 4.76 Mil/uL (ref 3.87–5.11)
RDW: 15.5 % (ref 11.5–15.5)
WBC: 8.8 10*3/uL (ref 4.0–10.5)

## 2020-10-16 LAB — COMPREHENSIVE METABOLIC PANEL
ALT: 19 U/L (ref 0–35)
AST: 17 U/L (ref 0–37)
Albumin: 3.9 g/dL (ref 3.5–5.2)
Alkaline Phosphatase: 99 U/L (ref 39–117)
BUN: 10 mg/dL (ref 6–23)
CO2: 28 mEq/L (ref 19–32)
Calcium: 9.5 mg/dL (ref 8.4–10.5)
Chloride: 103 mEq/L (ref 96–112)
Creatinine, Ser: 0.79 mg/dL (ref 0.40–1.20)
GFR: 83.85 mL/min (ref >60.00)
Glucose, Bld: 95 mg/dL (ref 70–99)
Potassium: 3.9 mEq/L (ref 3.5–5.1)
Sodium: 137 mEq/L (ref 135–145)
Total Bilirubin: 0.6 mg/dL (ref 0.2–1.2)
Total Protein: 7.3 g/dL (ref 6.0–8.3)

## 2020-10-16 LAB — C-REACTIVE PROTEIN: CRP: <1 mg/dL (ref 0.5–20.0)

## 2020-10-16 NOTE — Patient Instructions (Signed)
If you are age 56 or older, your body mass index should be between 23-30. Your Body mass index is 43.13 kg/m. If this is out of the aforementioned range listed, please consider follow up with your Primary Care Provider.  If you are age 37 or younger, your body mass index should be between 19-25. Your Body mass index is 43.13 kg/m. If this is out of the aformentioned range listed, please consider follow up with your Primary Care Provider.   LABS:   Labwork has been ordered for you today.  Press "B" on the elevator. The lab is located at the first door on the left as you exit the elevator.  HEALTHCARE LAWS AND MY CHART RESULTS: Due to recent changes in healthcare laws, you may see the results of your imaging and laboratory studies on MyChart before your provider has had a chance to review them.   We understand that in some cases there may be results that are confusing or concerning to you. Not all laboratory results come back in the same time frame and the provider may be waiting for multiple results in order to interpret others.  Please give [place] 48 hours in order for your provider to thoroughly review all the results before contacting the office for clarification of your results.   Colonoscopy to be determined after lab results.  OVER THE COUNTER MEDICATION  Please purchase the following medications over the counter and take as directed:   Desitin: Apply a small amount to the external anal area three times a day as needed.  Any probiotic of your choice once daily.  Please call our office if your symptoms worsen. It was great seeing you today!  Thank you for entrusting me with your care and choosing [facility].  Noralyn Pick, CRNP

## 2020-10-16 NOTE — Progress Notes (Signed)
10/16/2020 Carol Johnson [id number] 06-Apr-1966   Chief Complaint:  Diarrhea   History of Present Illness:  Carol Johnson is a 55 year old female with a past medical of anxiety, depression, hypertension, hypercholesterolemia, stroke 05/2019, LLE DVT 2016 on Xarelto, GERD and lymphocytic colitis.   She has a history of lymphocytic colitis confirmed by colonoscopy done by Dr. Deatra Ina in 2016. She was initially treated with Budesonide then transitioned to Lialda 4.8 g daily. She was previously instructed to wean off Lialda and to transition to Colestid as there is no data to support long-term use of Lialda with lymphocytic colitis. She was last seen in the office by Dr. Silverio Decamp 11/22/2019. At that time, she remained on Lialda 1.2 g once daily and Colestid 1 g daily. Her colitis and IBD symptoms were fairly well controlled and she was advised to stop mesalamine and increase Colestid to 2 g daily.  She called our office on 10/09/2020 with complaints of lower abdominal pian, bloody stools, dizziness and confusion. She was advised by Dr. Silverio Decamp to go to the ED with concerns of a possible recurrent stroke and possible diverticulitis with acute lower GI bleed, however, the patient did not go to the ED.   She presents to our office today for further evaluation regarding bloody diarrhea with associated lower abdominal pain. She denies having any confusion, dizziness, CP or SOB a this time. Last week she reported having 4 days of bloody diarrhea. She reported passing nine episodes of watery to mud-like brown diarrhea with bright red blood seen on the toilet tissue and extend the toilet water. She described having central lower abdominal pain without significant anorectal discomfort. He denies any antibiotics within the past 6 months. No new medications. Her diarrhea has decreased over the past few days. Yesterday she passed three mud-like stools with blood on the toilet tissue. Today she passed one mud-like  stool without any noticeable blood. Prior to the onset of her diarrhea she was passing softly formed bowel movements twice daily. Foods such as solid and ice cream trigger her IBS-D. She is on Pantoprazole 40 mg once daily without any dysphagia, heartburn or upper abdominal pain  Colonoscopy 01/02/2015: 1. Internal hemorrhoids 2. The examination was otherwise normal Surgical [P], random sites - SURFACE INTRAEPITHELIAL LYMPHOCYTOSIS, CONSISTENT WITH LYMPHOCYTIC COLITIS. - NO EVIDENCE OF INFLAMMATORY BOWEL DISEASE. - NEGATIVE FOR DYSPLASIA OR MALIGNANCY.  EGD 05/03/2014: Chronic gastritis REACTIVE GASTROPATHY WITH MILD CHRONIC INFLAMMATION. NO HELICOBACTER PYLORI, DYSPLASIA OR MALIGNANCY  Current Outpatient Medications on File Prior to Visit  Medication Sig Dispense Refill  . acetaminophen (TYLENOL) 325 MG tablet Take 2 tablets (650 mg total) by mouth every 4 (four) hours as needed for mild pain (or temp > 37.5 C (99.5 F)).    Marland Kitchen albuterol (VENTOLIN HFA) 108 (90 Base) MCG/ACT inhaler INHALE 2 PUFFS INTO THE LUNGS EVERY 4 (FOUR) HOURS AS NEEDED FOR WHEEZING, COUGH, OR SHORTNESS OF BREATH 6.7 each 3  . buPROPion (WELLBUTRIN XL) 300 MG 24 hr tablet Take 1 tablet (300 mg total) by mouth daily. 90 tablet 3  . citalopram (CELEXA) 20 MG tablet Take 1 tablet (20 mg total) by mouth daily. 90 tablet 3  . colestipol (COLESTID) 1 g tablet TAKE 2 TABLETS (2 G TOTAL) BY MOUTH DAILY. 180 tablet 0  . DERMA-SMOOTHE/FS SCALP 0.01 % OIL Apply 1 a small amount to skin three times a week  Part hair, apply to scalp, cover with plastic cap, wash out in the  morning    . fexofenadine (ALLEGRA) 180 MG tablet Take 180 mg by mouth daily as needed.     . fluticasone (FLONASE) 50 MCG/ACT nasal spray Place 2 sprays into both nostrils daily. 16 g 6  . loperamide (IMODIUM) 2 MG capsule Take 1 capsule (2 mg total) by mouth as needed for diarrhea or loose stools. 30 capsule 0  . methocarbamol (ROBAXIN) 500 MG tablet TAKE 2 TABLETS  (1,000 MG TOTAL) BY MOUTH 2 (TWO) TIMES DAILY AS NEEDED FOR MUSCLE SPASMS. 120 tablet 2  . pantoprazole (PROTONIX) 40 MG tablet Take 1 tablet (40 mg total) by mouth daily. 90 tablet 3  . rivaroxaban (XARELTO) 20 MG TABS tablet Take 1 tablet (20 mg total) by mouth daily with supper. 90 tablet 3  . zolpidem (AMBIEN CR) 12.5 MG CR tablet Take 1 tablet (12.5 mg total) by mouth at bedtime as needed. for sleep 30 tablet 3  . ketoconazole (NIZORAL) 2 % shampoo Apply 1 application topically 2 (two) times a week. (Patient not taking: Reported on 10/16/2020)     No current facility-administered medications on file prior to visit.   Allergies  Allergen Reactions  . Zoloft [Sertraline Hcl] Diarrhea     Current Medications, Allergies, Past Medical History, Past Surgical History, Family History and Social History were reviewed in [employer] record.   Review of Systems:   Constitutional: Negative for fever, sweats, chills or weight loss.  Respiratory: Negative for shortness of breath.   Cardiovascular: Negative for chest pain, palpitations and leg swelling.  Gastrointestinal: See HPI.  Musculoskeletal: Negative for back pain or muscle aches.  Neurological: See HPI.   Physical Exam: Ht 5' 3"  (1.6 m)   Wt 232 lb (105.2 kg)   LMP 11/29/2015   BMI 41.10 kg/m  General: 55 year old female in no acute distress. Head: Normocephalic and atraumatic. Eyes: No scleral icterus. Conjunctiva pink . PERRLA. Ears: Normal auditory acuity. Mouth: Dentition intact. No ulcers or lesions.  Lungs: Clear throughout to auscultation. Heart: Regular rate and rhythm, no murmur. Abdomen: Soft, nontender and nondistended. No masses or hepatomegaly. Normal bowel sounds x 4 quadrants.  Rectal: Inflamed external and anal hemorrhoids without active bleeding. No stool or blood in the rectal vault.  Musculoskeletal: Symmetrical with no gross deformities. Extremities: No edema. Neurological: Alert  oriented x 4. No focal deficits.  Psychological: Alert and cooperative. Normal mood and affect  Assessment and Recommendations:  85. 55 year old female with a history of lymphocytic colitis and IBS-D presents with lower abdominal pain with bloody diarrhea. -CBC, CMP, CRP and GI pathogen panel -CTAP deferred as abdominal exam negative on exam today. Patient will call office if abdominal pain recurs -Await the above lab results -Push fluids, bland diet -Continue Colestid 1gm two tabs po QD -Imodium as needed -Schedule a colonoscopy after the above lab results received   2. History of CVA 05/2019. Patient to follow up with her PCP and neurologist.  3. History of LLE DVT on Xarelto   4. GERD, stable on Pantoprazole 73m QD

## 2020-10-17 ENCOUNTER — Other Ambulatory Visit (INDEPENDENT_AMBULATORY_CARE_PROVIDER_SITE_OTHER): Payer: No Typology Code available for payment source

## 2020-10-17 ENCOUNTER — Other Ambulatory Visit: Payer: Self-pay

## 2020-10-17 DIAGNOSIS — E782 Mixed hyperlipidemia: Secondary | ICD-10-CM | POA: Diagnosis not present

## 2020-10-17 DIAGNOSIS — I10 Essential (primary) hypertension: Secondary | ICD-10-CM | POA: Diagnosis not present

## 2020-10-17 DIAGNOSIS — E538 Deficiency of other specified B group vitamins: Secondary | ICD-10-CM

## 2020-10-17 LAB — LIPID PANEL
Cholesterol: 132 mg/dL (ref 0–200)
HDL: 36.3 mg/dL — ABNORMAL LOW (ref >39.00)
LDL Cholesterol: 69 mg/dL (ref 0–99)
NonHDL: 95.74
Total CHOL/HDL Ratio: 4
Triglycerides: 136 mg/dL (ref 0.0–149.0)
VLDL: 27.2 mg/dL (ref 0.0–40.0)

## 2020-10-17 LAB — COMPREHENSIVE METABOLIC PANEL
ALT: 19 U/L (ref 0–35)
AST: 18 U/L (ref 0–37)
Albumin: 3.9 g/dL (ref 3.5–5.2)
Alkaline Phosphatase: 111 U/L (ref 39–117)
BUN: 8 mg/dL (ref 6–23)
CO2: 29 mEq/L (ref 19–32)
Calcium: 9.5 mg/dL (ref 8.4–10.5)
Chloride: 106 mEq/L (ref 96–112)
Creatinine, Ser: 0.82 mg/dL (ref 0.40–1.20)
GFR: 80.18 mL/min (ref >60.00)
Glucose, Bld: 134 mg/dL — ABNORMAL HIGH (ref 70–99)
Potassium: 4.1 mEq/L (ref 3.5–5.1)
Sodium: 141 mEq/L (ref 135–145)
Total Bilirubin: 0.2 mg/dL (ref 0.2–1.2)
Total Protein: 7.2 g/dL (ref 6.0–8.3)

## 2020-10-17 LAB — VITAMIN B12: Vitamin B-12: 284 pg/mL (ref 211–911)

## 2020-10-20 MED ORDER — VITAMIN B-12 100 MCG PO TABS: 100.0000 ug | ORAL_TABLET | Freq: Every day | ORAL | Status: DC

## 2020-10-20 MED ORDER — ATORVASTATIN CALCIUM 80 MG PO TABS: 80.0000 mg | ORAL_TABLET | Freq: Every day | ORAL | 0 refills | Status: DC

## 2020-10-20 NOTE — Addendum Note (Signed)
Addended by: Tammi Sou on: 10/20/2020 04:02 PM   Modules accepted: Orders

## 2020-10-22 ENCOUNTER — Ambulatory Visit (INDEPENDENT_AMBULATORY_CARE_PROVIDER_SITE_OTHER): Payer: No Typology Code available for payment source

## 2020-10-22 ENCOUNTER — Other Ambulatory Visit: Payer: Self-pay | Admitting: Family Medicine

## 2020-10-22 DIAGNOSIS — E538 Deficiency of other specified B group vitamins: Secondary | ICD-10-CM | POA: Diagnosis not present

## 2020-10-22 MED ORDER — CYANOCOBALAMIN 1000 MCG/ML IJ SOLN
1000.0000 ug | Freq: Once | INTRAMUSCULAR | Status: AC
Start: 2020-10-22 — End: 2020-10-22
  Administered 2020-10-22: 1000 ug via INTRAMUSCULAR

## 2020-10-22 NOTE — Progress Notes (Signed)
Per orders of Dr. Loura Pardon, injection of B12 given by Lurlean Nanny in Left deltoid  Patient tolerated injection well.

## 2020-10-23 ENCOUNTER — Telehealth: Payer: Self-pay

## 2020-10-23 NOTE — Telephone Encounter (Signed)
Pt left v/m that she has had dizziness and nausea and has no idea why; pt has to hold to furniture to walk; pt cannot read,look at computer or drive a car. I spoke with pt and starting on 10/22/20 she developed dizziness with room spinning. Never had dizziness before. No H/A, CP or SOB. No diet changes and no new meds. On 10/22/20 pt got B12 shot; pt said this is first B12 shot she has had in a very long time. If pt is laying completely still does not have dizziness. No way to ck BP. Pt said she does not want to go to another office or UC or ED. Pt request note sent to Dr Glori Bickers to see if could be added on to schedule on 10/24/20. Pt understands Dr Glori Bickers is out of office this afternoon and will wait for cb about appt. UC & ED precautions given and pt voiced understanding but does not want to go to either. Pt said she has hx of stroke. Sending note to Dr Glori Bickers who is not in office this afternoon and Dr Damita Dunnings who is in office this afternoon and [place] CMA.

## 2020-10-23 NOTE — Telephone Encounter (Signed)
Rec: eval either here or UC when possible.

## 2020-10-23 NOTE — Telephone Encounter (Signed)
I have to make sure I am going to be in the office tomorrow-(I think I will)   please check my schedule tomorrow am, thanks

## 2020-10-24 NOTE — Telephone Encounter (Signed)
Thanks, I will watch for correspondence

## 2020-10-24 NOTE — Telephone Encounter (Signed)
Patient returned call to office. Wanted to know if any appointments today. She did not want to go to be seen where they did not have her history. I have informed that any [facility] will have access to records. She agreed to go to one for evaluation today.

## 2020-10-25 ENCOUNTER — Other Ambulatory Visit: Payer: Self-pay | Admitting: Family

## 2020-10-25 DIAGNOSIS — J069 Acute upper respiratory infection, unspecified: Secondary | ICD-10-CM

## 2020-10-27 ENCOUNTER — Telehealth: Payer: Self-pay

## 2020-10-27 ENCOUNTER — Encounter: Payer: Self-pay | Admitting: Family Medicine

## 2020-10-27 ENCOUNTER — Encounter: Payer: Self-pay | Admitting: Adult Health

## 2020-10-27 MED ORDER — CITALOPRAM HYDROBROMIDE 20 MG PO TABS: 20.0000 mg | ORAL_TABLET | Freq: Every day | ORAL | 3 refills | Status: DC

## 2020-10-27 MED ORDER — BUPROPION HCL ER (XL) 300 MG PO TB24: 300.0000 mg | ORAL_TABLET | Freq: Every day | ORAL | 3 refills | Status: DC

## 2020-10-27 NOTE — Progress Notes (Signed)
Reviewed and agree with documentation and assessment and plan. K. Veena Chung Chagoya , MD   

## 2020-10-27 NOTE — Telephone Encounter (Signed)
Patient has an appointment with Dr. Glori Bickers on 10/29/20. Patient has also reached out to her neurologist.

## 2020-10-27 NOTE — Telephone Encounter (Signed)
See mychart message, doesn't look like PCP filled meds last so will route to her for review

## 2020-10-27 NOTE — Telephone Encounter (Signed)
[facility] - Client TELEPHONE ADVICE RECORD [facility] Patient Name: Carol Johnson Gender: Female DOB: [birth year] Age: 55 Y 20 D Return Phone Number: [phone] (Primary) Address: City/State/Zip: [place] [address] Client [facility] - Client Client Site [facility] Physician Glori Bickers, Roque Lias - MD Contact Type Call Who Is Calling Patient / Member / Family / Caregiver Call Type Triage / Clinical Relationship To Patient Self Return Phone Number [phone] (Primary) Chief Complaint Dizziness Reason for Call Symptomatic / Request for [name] states she has been experiencing dizzy spells. She loses balance a lot and feels nauseous. Translation No Nurse Assessment Nurse: Thad Ranger, RN, Denise Date/Time (Eastern Time): 10/27/2020 11:24:09 AM Confirm and document reason for call. If symptomatic, describe symptoms. ---Caller states she has been experiencing dizzy spells. She looses balance a lot w/o falling and feels nauseous. Does the patient have any new or worsening symptoms? ---Yes Will a triage be completed? ---Yes Related visit to physician within the last 2 weeks? ---No Does the PT have any chronic conditions? (i.e. diabetes, asthma, this includes High risk factors for pregnancy, etc.) ---Yes List chronic conditions. ---CVA, UC Is this a behavioral health or substance abuse call? ---No Guidelines Guideline Title Affirmed Question Affirmed Notes Nurse Date/Time (Eastern Time) Dizziness - Lightheadedness [1] MODERATE dizziness (e.g., interferes with normal activities) AND [2] has NOT been evaluated by physician for this (Exception: dizziness caused by heat exposure, sudden standing, or poor fluid intake) Carmon, RN, Langley Gauss 10/27/2020 11:25:49 AM Disp. Time [name] Time) Disposition Final User 10/27/2020 11:29:09 AM See PCP within 24 Hours Yes Carmon, RN,  Langley Gauss PLEASE NOTE: All timestamps contained within this report are represented as [place] Standard Time. CONFIDENTIALTY NOTICE: This fax transmission is intended only for the addressee. It contains information that is legally privileged, confidential or otherwise protected from use or disclosure. If you are not the intended recipient, you are strictly prohibited from reviewing, disclosing, copying using or disseminating any of this information or taking any action in reliance on or regarding this information. If you have received this fax in error, please notify us immediately by telephone so that we can arrange for its return to [place]. Phone: [phone], Toll-Free: [phone], Fax: [phone] Page: 2 of 2 Call Id: [id number] Caller Disagree/Comply Comply Caller Understands Yes PreDisposition Call Doctor Care Advice Given Per Guideline SEE PCP WITHIN 24 HOURS: DRINK FLUIDS: * Drink several glasses of fruit juice, other clear fluids or water. * This will improve hydration and blood glucose. LIE DOWN AND REST: * Lie down with feet elevated for 1 hour. * This will improve circulation and increase blood flow to the brain. CALL BACK IF: * Passes out (faints) * You become worse CARE ADVICE given per Dizziness (Adult) guideline. Referrals REFERRED TO PCP OFFICE

## 2020-10-28 ENCOUNTER — Other Ambulatory Visit: Payer: No Typology Code available for payment source

## 2020-10-28 DIAGNOSIS — K648 Other hemorrhoids: Secondary | ICD-10-CM

## 2020-10-28 DIAGNOSIS — K52832 Lymphocytic colitis: Secondary | ICD-10-CM

## 2020-10-28 DIAGNOSIS — R197 Diarrhea, unspecified: Secondary | ICD-10-CM

## 2020-10-29 ENCOUNTER — Encounter: Payer: Self-pay | Admitting: Family Medicine

## 2020-10-29 ENCOUNTER — Ambulatory Visit: Payer: No Typology Code available for payment source | Admitting: Family Medicine

## 2020-10-29 ENCOUNTER — Other Ambulatory Visit: Payer: Self-pay

## 2020-10-29 VITALS — BP 124/66 | HR 80 | Temp 96.9°F | Ht 61.5 in | Wt 232.5 lb

## 2020-10-29 DIAGNOSIS — J301 Allergic rhinitis due to pollen: Secondary | ICD-10-CM | POA: Diagnosis not present

## 2020-10-29 DIAGNOSIS — R42 Dizziness and giddiness: Secondary | ICD-10-CM | POA: Insufficient documentation

## 2020-10-29 DIAGNOSIS — H9190 Unspecified hearing loss, unspecified ear: Secondary | ICD-10-CM | POA: Insufficient documentation

## 2020-10-29 DIAGNOSIS — H9191 Unspecified hearing loss, right ear: Secondary | ICD-10-CM | POA: Diagnosis not present

## 2020-10-29 MED ORDER — MECLIZINE HCL 25 MG PO TABS: 25.0000 mg | ORAL_TABLET | Freq: Three times a day (TID) | ORAL | 0 refills | Status: DC | PRN

## 2020-10-29 NOTE — Assessment & Plan Note (Signed)
Unsure if linked to her vertigo  Some cerumen imp on L but not right Ref done to ENT

## 2020-10-29 NOTE — Assessment & Plan Note (Signed)
Positional vertigo worsened by moving head  Non focal neuro exam today (with baseline L sided weakness from past cva)  Disc imp of hydration  Px meclizine to use prn with caution of sedation  Handout given  Ref made to ENT for further eval and tx for this and hearing change

## 2020-10-29 NOTE — Patient Instructions (Addendum)
Get back on flonase  Try meclizine for dizziness with caution of sedation Do not change position quickly   If suddenly worse or any stroke symptoms go to ER and let us know   The office will call re: ENT referral

## 2020-10-29 NOTE — Progress Notes (Signed)
Subjective:    Patient ID: Carol Johnson, female    DOB: 1966/08/02, 55 y.o.   MRN: [id number]  This visit occurred during the SARS-CoV-2 public health emergency.  Safety protocols were in place, including screening questions prior to the visit, additional usage of staff PPE, and extensive cleaning of exam room while observing appropriate contact time as indicated for disinfecting solutions.    HPI Pt presents with c/o dizziness  Wt Readings from Last 3 Encounters:  10/29/20 232 lb 8 oz (105.5 kg)  10/16/20 232 lb (105.2 kg)  07/07/20 219 lb 12 oz (99.7 kg)   43.22 kg/m  Vertigo started last Thursday  Got some better and then worse this am   A spinning sensation  Worse with movement (when she turns her head to the R however it helps a bit)   No new weakness (L side is always weaker)   Lying down /closing eyes -room spins  Rush of dizziness when she gets up/changes position   Right now- stable /was able to drive  She has some chronic nasal congestion No ear pain or pressure  Also does not hear as well as she should  No vomiting  No double vision or change in vision  No headache  No trauma   Had a lot of stress the weekend before     H/o cerebral aneurysm and CVA in the past  Sees neurology   HTN BP Readings from Last 3 Encounters:  10/29/20 124/66  10/16/20 118/72  07/07/20 120/74   Pulse Readings from Last 3 Encounters:  10/29/20 80  10/16/20 78  07/07/20 73   Patient Active Problem List   Diagnosis Date Noted  . Vertigo 10/29/2020  . Hearing loss 10/29/2020  . Viral URI with cough 09/19/2020  . Hypercoagulable state ([place]) 12/18/2019  . Hair loss 12/03/2019  . Aneurysm of anterior cerebral artery 12/03/2019  . Abnormality of gait 09/04/2019  . Hemiparesis affecting nondominant side as late effect of stroke ([place]) 07/24/2019  . Muscle weakness (generalized) 06/11/2019  . Hemiplegia and hemiparesis following cerebral infarction affecting left  non-dominant side ([place]) 06/11/2019  . Major depressive disorder   . Hypokalemia   . Acute blood loss anemia   . Ulcerative colitis with complication ([place])   . H/O: CVA (cerebrovascular accident) 05/23/2019  . Thrombocytosis 05/23/2019  . Coronary artery disease involving native coronary artery of native heart without angina pectoris 03/21/2018  . Coronary artery calcification 02/10/2018  . Obesity (BMI 30-39.9) 02/10/2018  . Former smoker 01/15/2018  . Dyspnea on exertion 12/21/2017  . Carpal tunnel syndrome 12/21/2017  . Stress incontinence 12/21/2017  . Paresthesia of both feet 07/04/2017  . Always thirsty 07/04/2017  . Abdominal bloating 12/01/2015  . Fatigue 12/01/2015  . Vitamin B12 deficiency 12/01/2015  . Lymphocytic colitis 01/16/2015  . Anemia, iron deficiency 01/16/2015  . Anxiety state 12/20/2014  . Diarrhea 11/27/2014  . Insomnia 11/27/2014  . History of DVT (deep vein thrombosis) 09/17/2014  . OSA (obstructive sleep apnea) 09/02/2014  . Gastritis, chronic 06/06/2014  . Unspecified gastritis and gastroduodenitis without mention of hemorrhage 05/03/2014  . Abdominal pain, epigastric 04/19/2014  . Internal hemorrhoids with other complication 02/63/7858  . Routine general medical examination at a health care facility 10/14/2011  . Thyroid nodule 05/16/2011  . Rectal bleeding 11/24/2010  . Hemorrhoids, internal 11/24/2010  . Irritable bowel syndrome (IBS) 11/24/2010  . ANEMIA 10/02/2009  . Hyperlipidemia 07/18/2009  . Essential hypertension 07/18/2009  . DYSPNEA ON EXERTION  07/18/2009  . THYROID CYST 01/31/2008  . ANXIETY 01/31/2008  . Depression with anxiety 01/31/2008  . GASTRIC ULCER, HX OF 01/31/2008  . FIBROIDS, UTERUS 11/13/2007  . ALLERGIC RHINITIS 11/13/2007  . Asthma 11/13/2007  . GERD 11/13/2007  . IRRITABLE BOWEL SYNDROME 11/13/2007  . INTERNAL HEMORRHOIDS 11/12/2003   Past Medical History:  Diagnosis Date  . Abdominal pain, unspecified site   .  Allergic rhinitis   . Anxiety   . Asthma   . Depression   . DVT (deep venous thrombosis) ([place]) 2016  . DVT of deep femoral vein, left ([place])   . Fibroid uterus   . GERD (gastroesophageal reflux disease)   . Hypercholesterolemia   . Hypertension   . Internal hemorrhoid   . Irritable bowel syndrome   . Lymphocytic colitis 01/02/2015  . Pneumonia 1998  . Stroke ([place]) 2020  . Ulcerative colitis ([place])   . URI (upper respiratory infection)   . Uterine fibroid   . Vitamin B12 deficiency    Past Surgical History:  Procedure Laterality Date  . CESAREAN SECTION  2004  . CHOLECYSTECTOMY     laparoscopic "gallstones"  . ESOPHAGOGASTRODUODENOSCOPY (EGD) WITH PROPOFOL N/A 05/03/2014   Procedure: ESOPHAGOGASTRODUODENOSCOPY (EGD) WITH PROPOFOL;  Surgeon: Inda Castle, MD;  Location: [facility];  Service: Endoscopy;  Laterality: N/A;  . MYOMECTOMY    . UTERINE FIBROID SURGERY     Social History   Tobacco Use  . Smoking status: Former Smoker    Packs/day: 0.10    Years: 10.00    Pack years: 1.00    Types: Cigarettes    Quit date: 12/07/2017    Years since quitting: 2.8  . Smokeless tobacco: Never Used  Vaping Use  . Vaping Use: Never used  Substance Use Topics  . Alcohol use: Yes    Alcohol/week: 0.0 standard drinks    Comment: rarely  . Drug use: No   Family History  Problem Relation Age of [place]  . Pulmonary fibrosis Father   . Colon cancer Paternal Aunt   . Uterine cancer Paternal Grandmother        with mets to the colon   . Heart failure Maternal Grandmother   . Heart disease Paternal Uncle   . Heart disease Paternal Aunt   . Kidney disease Cousin   . Hyperlipidemia Mother   . Mitral valve prolapse Mother   . Heart disease Mother        arrhythmia   Allergies  Allergen Reactions  . Zoloft [Sertraline Hcl] Diarrhea   Current Outpatient Medications on File Prior to Visit  Medication Sig Dispense Refill  . acetaminophen (TYLENOL) 325 MG tablet Take 2 tablets  (650 mg total) by mouth every 4 (four) hours as needed for mild pain (or temp > 37.5 C (99.5 F)).    Marland Kitchen albuterol (VENTOLIN HFA) 108 (90 Base) MCG/ACT inhaler INHALE 2 PUFFS INTO THE LUNGS EVERY 4 (FOUR) HOURS AS NEEDED FOR WHEEZING, COUGH, OR SHORTNESS OF BREATH 6.7 each 3  . atorvastatin (LIPITOR) 80 MG tablet TAKE 1 TABLET (80 MG TOTAL) BY MOUTH DAILY AT 6 PM. 90 tablet 1  . buPROPion (WELLBUTRIN XL) 300 MG 24 hr tablet Take 1 tablet (300 mg total) by mouth daily. 90 tablet 3  . citalopram (CELEXA) 20 MG tablet Take 1 tablet (20 mg total) by mouth daily. 90 tablet 3  . colestipol (COLESTID) 1 g tablet TAKE 2 TABLETS (2 G TOTAL) BY MOUTH DAILY. 180 tablet 0  . DERMA-SMOOTHE/FS  SCALP 0.01 % OIL Apply 1 a small amount to skin three times a week  Part hair, apply to scalp, cover with plastic cap, wash out in the morning    . fexofenadine (ALLEGRA) 180 MG tablet Take 180 mg by mouth daily as needed.     . fluticasone (FLONASE) 50 MCG/ACT nasal spray Place 2 sprays into both nostrils daily. 16 g 6  . ketoconazole (NIZORAL) 2 % shampoo Apply 1 application topically 2 (two) times a week.    . loperamide (IMODIUM) 2 MG capsule Take 1 capsule (2 mg total) by mouth as needed for diarrhea or loose stools. 30 capsule 0  . methocarbamol (ROBAXIN) 500 MG tablet TAKE 2 TABLETS (1,000 MG TOTAL) BY MOUTH 2 (TWO) TIMES DAILY AS NEEDED FOR MUSCLE SPASMS. 120 tablet 2  . pantoprazole (PROTONIX) 40 MG tablet Take 1 tablet (40 mg total) by mouth daily. 90 tablet 3  . rivaroxaban (XARELTO) 20 MG TABS tablet Take 1 tablet (20 mg total) by mouth daily with supper. 90 tablet 3  . vitamin B-12 (CYANOCOBALAMIN) 100 MCG tablet Take 1 tablet (100 mcg total) by mouth daily. 1 tablet   . zolpidem (AMBIEN CR) 12.5 MG CR tablet Take 1 tablet (12.5 mg total) by mouth at bedtime as needed. for sleep 30 tablet 3   No current facility-administered medications on file prior to visit.    Review of Systems  Constitutional: Positive for  fatigue. Negative for activity change, appetite change, fever and unexpected weight change.  HENT: Negative for congestion, ear pain, rhinorrhea, sinus pressure and sore throat.   Eyes: Negative for pain, redness and visual disturbance.  Respiratory: Negative for cough, shortness of breath and wheezing.   Cardiovascular: Negative for chest pain and palpitations.  Gastrointestinal: Positive for nausea. Negative for abdominal pain, blood in stool, constipation, diarrhea and vomiting.  Endocrine: Negative for polydipsia and polyuria.  Genitourinary: Negative for dysuria, frequency and urgency.  Musculoskeletal: Negative for arthralgias, back pain and myalgias.  Skin: Negative for pallor and rash.  Allergic/Immunologic: Negative for environmental allergies.  Neurological: Positive for dizziness and weakness. Negative for syncope, facial asymmetry and headaches.       Baseline L sided weakness from cva  Hematological: Negative for adenopathy. Does not bruise/bleed easily.  Psychiatric/Behavioral: Negative for decreased concentration and dysphoric mood. The patient is not nervous/anxious.        Objective:   Physical Exam Constitutional:      General: She is not in acute distress.    Appearance: Normal appearance. She is well-developed and well-nourished. She is obese. She is not ill-appearing or diaphoretic.  HENT:     Head: Normocephalic and atraumatic.     Comments: No facial tenderness     Right Ear: Tympanic membrane, ear canal and external ear normal.     Left Ear: External ear normal. There is impacted cerumen.     Nose: Nose normal.     Comments: Nares are boggy    Mouth/Throat:     Mouth: Oropharynx is clear and moist. Mucous membranes are moist.     Pharynx: Oropharynx is clear. No oropharyngeal exudate.      Comments: No sinus tenderness No temporal tenderness  No TMJ tendernessEyes:     General: No scleral icterus.       Right eye: No discharge.        Left eye: No  discharge.     Extraocular Movements: EOM normal.     Conjunctiva/sclera: Conjunctivae normal.  Pupils: Pupils are equal, round, and reactive to light.     Comments: 2-3 beats of horizontal nystagmus bilat   Neck:     Thyroid: No thyromegaly.     Vascular: No carotid bruit or JVD.     Trachea: No tracheal deviation.  Cardiovascular:     Rate and Rhythm: Normal rate and regular rhythm.     Heart sounds: Normal heart sounds. No murmur heard.   Pulmonary:     Effort: Pulmonary effort is normal. No respiratory distress.     Breath sounds: Normal breath sounds. No wheezing or rales.  Abdominal:     General: Bowel sounds are normal. There is no distension.     Palpations: Abdomen is soft. There is no mass.     Tenderness: There is no abdominal tenderness.  Musculoskeletal:        General: No tenderness or edema.     Cervical back: Full passive range of motion without pain, normal range of motion and neck supple. No tenderness.     Right lower leg: No edema.     Left lower leg: No edema.  Lymphadenopathy:     Cervical: No cervical adenopathy.  Skin:    General: Skin is warm and dry.     Coloration: Skin is not pale.     Findings: No rash.  Neurological:     Mental Status: She is alert and oriented to person, place, and time.     Cranial Nerves: Cranial nerves are intact. No cranial nerve deficit, dysarthria or facial asymmetry.     Sensory: Sensation is intact. No sensory deficit.     Motor: Weakness present. No tremor, atrophy, abnormal muscle tone or pronator drift.     Coordination: She displays a negative Romberg sign. Romberg sign negative. Coordination normal. Finger-Nose-Finger Test normal.     Gait: Gait is intact. Gait normal.     Deep Tendon Reflexes: Strength normal and reflexes are normal and symmetric. Reflexes normal.     Comments: No focal cerebellar signs   Baseline mild L sided weakness  Gait is slow /guarded and steady   Some dizziness with rhomberg exam    Psychiatric:        Mood and Affect: Mood and affect and mood normal.        Behavior: Behavior normal.        Thought Content: Thought content normal.           Assessment & Plan:   Problem List Items Addressed This Visit      Respiratory   Allergic rhinitis    Recommend re starting flonase for sinus congestion that may worsen vertigo or cause ETD        Nervous and Auditory   Hearing loss    Unsure if linked to her vertigo  Some cerumen imp on L but not right Ref done to ENT        Other   Vertigo - Primary    Positional vertigo worsened by moving head  Non focal neuro exam today (with baseline L sided weakness from past cva)  Disc imp of hydration  Px meclizine to use prn with caution of sedation  Handout given  Ref made to ENT for further eval and tx for this and hearing change      Relevant Orders   Ambulatory referral to ENT

## 2020-10-29 NOTE — Assessment & Plan Note (Signed)
Recommend re starting flonase for sinus congestion that may worsen vertigo or cause ETD

## 2020-10-31 ENCOUNTER — Other Ambulatory Visit: Payer: Self-pay | Admitting: Family

## 2020-10-31 DIAGNOSIS — J069 Acute upper respiratory infection, unspecified: Secondary | ICD-10-CM

## 2020-11-01 LAB — GI PROFILE, STOOL, PCR

## 2020-11-11 ENCOUNTER — Other Ambulatory Visit: Payer: Self-pay | Admitting: Hematology and Oncology

## 2020-11-11 DIAGNOSIS — I82402 Acute embolism and thrombosis of unspecified deep veins of left lower extremity: Secondary | ICD-10-CM

## 2020-11-11 NOTE — Progress Notes (Signed)
No show

## 2020-11-12 ENCOUNTER — Inpatient Hospital Stay
Payer: No Typology Code available for payment source | Attending: Hematology and Oncology | Admitting: Hematology and Oncology

## 2020-11-12 ENCOUNTER — Inpatient Hospital Stay: Payer: No Typology Code available for payment source

## 2020-11-12 DIAGNOSIS — I82402 Acute embolism and thrombosis of unspecified deep veins of left lower extremity: Secondary | ICD-10-CM

## 2020-11-14 ENCOUNTER — Telehealth: Payer: Self-pay | Admitting: Hematology and Oncology

## 2020-11-14 NOTE — Telephone Encounter (Signed)
Scheduled appts per 3/17 sch msg. Called pt, no answer. Left vm with appts date and times.

## 2020-11-19 ENCOUNTER — Other Ambulatory Visit: Payer: Self-pay | Admitting: Family Medicine

## 2020-11-19 ENCOUNTER — Encounter: Payer: Self-pay | Admitting: Family Medicine

## 2020-11-19 ENCOUNTER — Other Ambulatory Visit: Payer: Self-pay | Admitting: Family

## 2020-11-19 DIAGNOSIS — J069 Acute upper respiratory infection, unspecified: Secondary | ICD-10-CM

## 2020-11-19 NOTE — Telephone Encounter (Signed)
Pt left v/m wanting to ck to make sure Ambien will be called in today before end of day. I spoke with  Bernadene Bell at [facility] and she said the refill for zolpidem was received; [facility] only has one pharmacist today but pts rx should be ready for pick up in 30 mins. Pt notified as instructed by [place] and voiced understanding and pt was appreciative. Nothing further needed at this time.

## 2020-11-19 NOTE — Telephone Encounter (Signed)
See refill request in separate refill encounter

## 2020-11-19 NOTE — Telephone Encounter (Signed)
Name of Medication: Zolpidem Name of Pharmacy: [facility] [address] Henrietta Dine or Written Date and Quantity: 06/16/20 #30 tabs with 3 refills Last Office Visit and Type: dizziness on 10/29/20 Next Office Visit and Type: none scheduled   xarelto last filled on 12/02/49 #90 tabs with 3 refills

## 2020-11-20 ENCOUNTER — Encounter: Payer: Self-pay | Admitting: Gastroenterology

## 2020-11-20 ENCOUNTER — Other Ambulatory Visit: Payer: Self-pay

## 2020-11-20 ENCOUNTER — Telehealth: Payer: Self-pay | Admitting: *Deleted

## 2020-11-20 ENCOUNTER — Ambulatory Visit: Payer: No Typology Code available for payment source | Admitting: Gastroenterology

## 2020-11-20 VITALS — BP 122/80 | HR 76 | Ht 61.5 in | Wt 226.0 lb

## 2020-11-20 DIAGNOSIS — Z7901 Long term (current) use of anticoagulants: Secondary | ICD-10-CM

## 2020-11-20 DIAGNOSIS — K52832 Lymphocytic colitis: Secondary | ICD-10-CM

## 2020-11-20 DIAGNOSIS — K529 Noninfective gastroenteritis and colitis, unspecified: Secondary | ICD-10-CM | POA: Diagnosis not present

## 2020-11-20 MED ORDER — PLENVU 140 G PO SOLR: ORAL | 0 refills | Status: DC

## 2020-11-20 NOTE — Progress Notes (Signed)
Carol Johnson    [id number]    [birth year]  Primary Care Physician:Tower, Wynelle Fanny, MD  Referring Physician: Tower, Wynelle Fanny, MD [place],  [place] [id number]   Chief complaint: Chronic diarrhea  HPI:  55 year old very pleasant female with history of anxiety, depression, hypertension, s/p CVA September 2020, DVT on chronic anticoagulation with Xarelto here for follow-up visit for chronic diarrhea and lymphocytic colitis   She was seen by Jaclyn Shaggy in the office last month for worsening diarrhea and rectal bleeding. GI pathogen panel positive for norovirus  She is doing better, no longer having diarrhea but continues to have semiformed to liquid bowel movements 3-4 times a day despite taking Colestid twice daily and Imodium as needed.  Denies any rectal bleeding or melena.   Lymphocytic colitis diagnosed in 2016. She was treated with budesonide for a year by Dr. Deatra Ina, was tapered off in 2017. Dr. Deatra Ina also started her on Lialda at the time for lymphocytic colitis.  She has since tapered off mesalamine and is currently taking Colestid 1 g twice daily.  No family history of colon cancer or IBD    Colonoscopy 01/02/2015: 1. Internal hemorrhoids 2. The examination was otherwise normal Surgical [P], random sites - SURFACE INTRAEPITHELIAL LYMPHOCYTOSIS, CONSISTENT WITH LYMPHOCYTIC COLITIS. - NO EVIDENCE OF INFLAMMATORY BOWEL DISEASE. - NEGATIVE FOR DYSPLASIA OR MALIGNANCY.  EGD 05/03/2014: Chronic gastritis REACTIVE GASTROPATHY WITH MILD CHRONIC INFLAMMATION. NO HELICOBACTER PYLORI, DYSPLASIA OR MALIGNANCY   Outpatient Encounter Medications as of 11/20/2020  Medication Sig  . acetaminophen (TYLENOL) 325 MG tablet Take 2 tablets (650 mg total) by mouth every 4 (four) hours as needed for mild pain (or temp > 37.5 C (99.5 F)).  Marland Kitchen albuterol (VENTOLIN HFA) 108 (90 Base) MCG/ACT inhaler INHALE 2 PUFFS INTO THE LUNGS EVERY 4 (FOUR) HOURS AS NEEDED FOR  WHEEZING, COUGH, OR SHORTNESS OF BREATH  . atorvastatin (LIPITOR) 80 MG tablet TAKE 1 TABLET (80 MG TOTAL) BY MOUTH DAILY AT 6 PM.  . buPROPion (WELLBUTRIN XL) 300 MG 24 hr tablet Take 1 tablet (300 mg total) by mouth daily.  . citalopram (CELEXA) 20 MG tablet Take 1 tablet (20 mg total) by mouth daily.  . colestipol (COLESTID) 1 g tablet TAKE 2 TABLETS (2 G TOTAL) BY MOUTH DAILY.  Marland Kitchen DERMA-SMOOTHE/FS SCALP 0.01 % OIL Apply 1 a small amount to skin three times a week  Part hair, apply to scalp, cover with plastic cap, wash out in the morning  . fexofenadine (ALLEGRA) 180 MG tablet Take 180 mg by mouth daily as needed.   . fluticasone (FLONASE) 50 MCG/ACT nasal spray Place 2 sprays into both nostrils daily.  Marland Kitchen ketoconazole (NIZORAL) 2 % shampoo Apply 1 application topically 2 (two) times a week.  . loperamide (IMODIUM) 2 MG capsule Take 1 capsule (2 mg total) by mouth as needed for diarrhea or loose stools.  . meclizine (ANTIVERT) 25 MG tablet Take 1 tablet (25 mg total) by mouth 3 (three) times daily as needed for dizziness.  . methocarbamol (ROBAXIN) 500 MG tablet TAKE 2 TABLETS (1,000 MG TOTAL) BY MOUTH 2 (TWO) TIMES DAILY AS NEEDED FOR MUSCLE SPASMS.  Marland Kitchen pantoprazole (PROTONIX) 40 MG tablet Take 1 tablet (40 mg total) by mouth daily.  . vitamin B-12 (CYANOCOBALAMIN) 100 MCG tablet Take 1 tablet (100 mcg total) by mouth daily.  Alveda Reasons 20 MG TABS tablet TAKE 1 TABLET (20 MG TOTAL) BY MOUTH  DAILY WITH SUPPER.  Marland Kitchen zolpidem (AMBIEN CR) 12.5 MG CR tablet TAKE 1 TABLET (12.5 MG TOTAL) BY MOUTH AT BEDTIME AS NEEDED. FOR SLEEP   No facility-administered encounter medications on file as of 11/20/2020.    Allergies as of 11/20/2020 - Review Complete 10/29/2020  Allergen Reaction Noted  . Zoloft [sertraline hcl] Diarrhea 04/19/2014    Past Medical History:  Diagnosis Date  . Abdominal pain, unspecified site   . Allergic rhinitis   . Anxiety   . Asthma   . Depression   . DVT (deep venous  thrombosis) ([place]) 2016  . DVT of deep femoral vein, left ([place])   . Fibroid uterus   . GERD (gastroesophageal reflux disease)   . Hypercholesterolemia   . Hypertension   . Internal hemorrhoid   . Irritable bowel syndrome   . Lymphocytic colitis 01/02/2015  . Pneumonia 1998  . Stroke ([facility]) 2020  . Ulcerative colitis ([place])   . URI (upper respiratory infection)   . Uterine fibroid   . Vitamin B12 deficiency     Past Surgical History:  Procedure Laterality Date  . CESAREAN SECTION  2004  . CHOLECYSTECTOMY     laparoscopic "gallstones"  . ESOPHAGOGASTRODUODENOSCOPY (EGD) WITH PROPOFOL N/A 05/03/2014   Procedure: ESOPHAGOGASTRODUODENOSCOPY (EGD) WITH PROPOFOL;  Surgeon: Inda Castle, MD;  Location: [facility];  Service: Endoscopy;  Laterality: N/A;  . MYOMECTOMY    . UTERINE FIBROID SURGERY      Family History  Problem Relation Age of Onset  . Pulmonary fibrosis Father   . Colon cancer Paternal Aunt   . Uterine cancer Paternal Grandmother        with mets to the colon   . Heart failure Maternal Grandmother   . Heart disease Paternal Uncle   . Heart disease Paternal Aunt   . Kidney disease Cousin   . Hyperlipidemia Mother   . Mitral valve prolapse Mother   . Heart disease Mother        arrhythmia    Social History   Socioeconomic History  . Marital status: Legally Separated    Spouse name: Not on file  . Number of children: 1  . Years of education: Not on file  . Highest education level: Not on file  Occupational History  . Occupation: Admin. assistant  Tobacco Use  . Smoking status: Former Smoker    Packs/day: 0.10    Years: 10.00    Pack years: 1.00    Types: Cigarettes    Quit date: 12/07/2017    Years since quitting: 2.9  . Smokeless tobacco: Never Used  Vaping Use  . Vaping Use: Never used  Substance and Sexual Activity  . Alcohol use: Yes    Alcohol/week: 0.0 standard drinks    Comment: rarely  . Drug use: No  . Sexual activity: Yes    Birth  control/protection: Pill  Other Topics Concern  . Not on file  Social History Narrative  . Not on file   Social Determinants of Health   Financial Resource Strain: Not on file  Food Insecurity: Not on file  Transportation Needs: Not on file  Physical Activity: Not on file  Stress: Not on file  Social Connections: Not on file  Intimate Partner Violence: Not on file      Review of systems: All other review of systems negative except as mentioned in the HPI.   Physical Exam: Vitals:   11/20/20 0955  BP: 122/80  Pulse: 76   Body mass  index is 42.01 kg/m. Gen:      No acute distress HEENT:  sclera anicteric Abd:      soft, non-tender; no palpable masses, no distension Ext:    No edema Neuro: alert and oriented x 3 Psych: normal mood and affect  Data Reviewed:  Reviewed labs, radiology imaging, old records and pertinent past GI work up   Assessment and Plan/Recommendations:  55 year old very pleasant female with history of DVT, CVA in 2020 on chronic anticoagulation with Xarelto History of lymphocytic colitis and chronic diarrhea Switch to Prevalite, advised patient to take half to 1 packet twice daily.  Avoid taking medications within 3 hours of Prevalite to prevent drug interaction We will schedule for colonoscopy with biopsies to exclude persistent lymphocytic colitis or in some scenario lymphocytic colitis can progress to ulcerative colitis.  Given her persistent symptoms will need to exclude UC  We will obtain clearance from neurology and prescribing provider to hold Xarelto for 2 days prior to the procedure  The risks and benefits as well as alternatives of endoscopic procedure(s) have been discussed and reviewed. All questions answered. The patient agrees to proceed.    The patient was provided an opportunity to ask questions and all were answered. The patient agreed with the plan and demonstrated an understanding of the instructions.  Damaris Hippo ,  MD    CC: Tower, Wynelle Fanny, MD

## 2020-11-20 NOTE — Telephone Encounter (Signed)
°  Request for surgical clearance:     Endoscopy Procedure  What type of surgery is being performed?     Colonoscopy   When is this surgery scheduled?     02/04/2021  What type of clearance is required ?   Pharmacy  Are there any medications that need to be held prior to surgery and how long? Xarelto   Practice name and name of physician performing surgery?      [facility]  Harl Bowie  What is your office phone and fax number?      Phone- [phone]  [phone]  Anesthesia type (None, local, MAC, general) ?       MAC

## 2020-11-20 NOTE — Patient Instructions (Signed)
You have been scheduled for a colonoscopy. Please follow written instructions given to you at your visit today.  Please pick up your prep supplies at the pharmacy within the next 1-3 days. If you use inhalers (even only as needed), please bring them with you on the day of your procedure.   Due to recent changes in healthcare laws, you may see the results of your imaging and laboratory studies on MyChart before your provider has had a chance to review them.  We understand that in some cases there may be results that are confusing or concerning to you. Not all laboratory results come back in the same time frame and the provider may be waiting for multiple results in order to interpret others.  Please give [place] 48 hours in order for your provider to thoroughly review all the results before contacting the office for clarification of your results.   If you are age 56 or older, your body mass index should be between 23-30. Your Body mass index is 42.01 kg/m. If this is out of the aforementioned range listed, please consider follow up with your Primary Care Provider.  If you are age 79 or younger, your body mass index should be between 19-25. Your Body mass index is 42.01 kg/m. If this is out of the aformentioned range listed, please consider follow up with your Primary Care Provider.    You will be contacted by our office prior to your procedure for directions on holding your Xarelto.  If you do not hear from our office 1 week prior to your scheduled procedure, please call [phone] to discuss.  STOP Colestid  Take Prevalite 1/2-1 packet twice daily  I appreciate the  opportunity to care for you  Thank You   Harl Bowie , MD

## 2020-11-24 NOTE — Telephone Encounter (Signed)
I checked in with her hematologist and this was the response:  Orson Slick, MD  Tayveon Lombardo, Wynelle Fanny, MD Thank you for reaching out. OK to hold anticoagulation prior to her procedure (hold 48-72 hours before, restart as soon as you are able post-procedure). No bridge required. Please let me know if you have any other questions or concerns regarding the care of this patient.   Best,   Ulice Dash    I think GI wanted to hold for 48 hours   thanks

## 2020-12-12 ENCOUNTER — Inpatient Hospital Stay: Payer: No Typology Code available for payment source | Attending: Hematology and Oncology

## 2020-12-12 ENCOUNTER — Inpatient Hospital Stay: Payer: No Typology Code available for payment source | Admitting: Hematology and Oncology

## 2020-12-26 ENCOUNTER — Other Ambulatory Visit: Payer: Self-pay | Admitting: Otolaryngology

## 2020-12-26 DIAGNOSIS — R42 Dizziness and giddiness: Secondary | ICD-10-CM

## 2020-12-28 ENCOUNTER — Other Ambulatory Visit: Payer: Self-pay | Admitting: Family

## 2020-12-28 DIAGNOSIS — J069 Acute upper respiratory infection, unspecified: Secondary | ICD-10-CM

## 2021-01-02 ENCOUNTER — Other Ambulatory Visit: Payer: Self-pay | Admitting: Gastroenterology

## 2021-01-02 ENCOUNTER — Ambulatory Visit
Admission: RE | Admit: 2021-01-02 | Discharge: 2021-01-02 | Disposition: A | Discharge status: Home / Self Care | Payer: No Typology Code available for payment source | Source: Ambulatory Visit | Attending: Otolaryngology | Admitting: Otolaryngology

## 2021-01-02 ENCOUNTER — Other Ambulatory Visit: Payer: Self-pay

## 2021-01-02 DIAGNOSIS — R42 Dizziness and giddiness: Secondary | ICD-10-CM | POA: Insufficient documentation

## 2021-01-02 MED ORDER — GADOBUTROL 1 MMOL/ML IV SOLN
10.0000 mL | Freq: Once | INTRAVENOUS | Status: AC | PRN
  Administered 2021-01-02: 10 mL via INTRAVENOUS

## 2021-01-06 ENCOUNTER — Encounter: Payer: Self-pay | Admitting: Family Medicine

## 2021-01-06 DIAGNOSIS — R197 Diarrhea, unspecified: Secondary | ICD-10-CM

## 2021-01-07 MED ORDER — MESALAMINE 1.2 G PO TBEC: 4.8000 g | DELAYED_RELEASE_TABLET | Freq: Every day | ORAL | 2 refills | Status: DC

## 2021-01-12 ENCOUNTER — Encounter: Payer: Self-pay | Admitting: Neurology

## 2021-01-12 ENCOUNTER — Ambulatory Visit: Payer: No Typology Code available for payment source | Admitting: Neurology

## 2021-01-12 VITALS — BP 137/91 | HR 77 | Ht 64.0 in | Wt 233.5 lb

## 2021-01-12 DIAGNOSIS — R269 Unspecified abnormalities of gait and mobility: Secondary | ICD-10-CM | POA: Diagnosis not present

## 2021-01-12 DIAGNOSIS — M542 Cervicalgia: Secondary | ICD-10-CM | POA: Diagnosis not present

## 2021-01-12 DIAGNOSIS — R42 Dizziness and giddiness: Secondary | ICD-10-CM

## 2021-01-12 DIAGNOSIS — I6339 Cerebral infarction due to thrombosis of other cerebral artery: Secondary | ICD-10-CM

## 2021-01-12 DIAGNOSIS — R0683 Snoring: Secondary | ICD-10-CM | POA: Insufficient documentation

## 2021-01-12 DIAGNOSIS — I639 Cerebral infarction, unspecified: Secondary | ICD-10-CM | POA: Insufficient documentation

## 2021-01-12 DIAGNOSIS — G8929 Other chronic pain: Secondary | ICD-10-CM | POA: Insufficient documentation

## 2021-01-12 MED ORDER — BUTALBITAL-APAP-CAFFEINE 50-325-40 MG PO TABS: 1.0000 | ORAL_TABLET | Freq: Four times a day (QID) | ORAL | 5 refills | Status: DC | PRN

## 2021-01-12 MED ORDER — TOPIRAMATE 100 MG PO TABS: 100.0000 mg | ORAL_TABLET | Freq: Two times a day (BID) | ORAL | 11 refills | Status: DC

## 2021-01-12 NOTE — Progress Notes (Addendum)
Chief Complaint  Patient presents with  . Consult    Consultation for intermittent numbness/tingling in hands and toes. Recent abnormal MRI brain. Hx of stroke. Currently on Xarelto 9m daily. Dizziness has resolved.      ASSESSMENT AND PLAN  Tyese MBINDU DOCTERis a 55y.o. female   History of right chronic radiata stroke with residual mild spastic left hemiparesis  Vascular risk factor of hypertension, obesity, hyperlipidemia, history of DVT  On Xarelto treatment  Was referred to physical therapy Probable obstructive sleep apnea  She also complains of frequent snoring, frequent awakening at nighttime,  Referral to sleep study  Frequent headache with migraine features  Topamax 100 mg twice a day as migraine prevention  Not a candidate for triptan due to history of stroke  Aricept as needed  DIAGNOSTIC DATA (LABS, IMAGING, TESTING) - I reviewed patient records, labs, notes, testing and imaging myself where available.  MRI brain w/wo on May 6th 2022. 1. No acute intracranial abnormality. 2. No cerebellopontine angle mass or abnormal contrast enhancement. Normal appearance of seventh and eighth cranial nerves bilaterally. 3. Remote lacunar infarcts in the right basal ganglia and corona radiata. Nonspecific T2 hyperintense white matter lesions with new focus on the right side of the pons, likely representing chronic small vessel disease.  MRA brain wo December 26 2019. No evidence of intracranial aneurysm. Previously described possible aneurysm appear to correspond to the origin of a small branch from the proximal right A2/ACA. MRA neck in Sept 2020, was normal.   MRI brain on Sept 23 2020  1. Motion degraded examination. 2. 2.1 x 0.7 cm acute infarct involving right corona radiata/internal capsule and extending into the right basal ganglia. 3. Mild chronic small vessel ischemic disease.  MRA head   1. Motion degraded examination. 2. No evidence of intracranial  large vessel occlusion or high-grade proximal arterial stenosis. 3. A 2 mm aneurysm arising the A2 right anterior cerebral artery is questioned, although evaluation is somewhat limited due to motion degradation. Consider follow-up CT or MR angiography.  Labs on October 17, 2020, LDL 69, normal CMP, creatinine 0.8, glucose 137, low normal B12 284, normal CBC, hemoglobin of 12.2, C-reactive protein less than 28 November 2009, normal TSH, A1c 6.02 May 2019     HISTORICAL  Miasha MCHASTA DESHPANDEis a 55year old female, seen in request by Dr. VCarloyn Manner for evaluation of bilateral hands and toes paresthesia, initial evaluation was on Jan 12, 2021.  Her primary care physician is Dr. TGlori Bickers MWynelle Fanny MD  I reviewed and summarized the referring note. PMHx. DVT, left calf, xarelto Ulceratives colitis Stroke, aneurysm, bleeding, in 2020. HLD Depression, chronic insomnia HTN Obesity  She is alone at today's clinical visit, she suffered a stroke in September 2020, presented with left-sided weakness,  Personally reviewed MRI of the brain, showed acute infarction involving right corona radiata, extending into the internal capsule, and right basal ganglia MRA of the brain with the possibility of 2 mm right A2 division aneurysm, MRA of the neck showed no large vessel disease Echocardiogram showed ejection fraction of 60 to 65%, no significant abnormality  She was discharged home with aspirin 81 mg,  She already have a history of lower extremity DVT, this happened in the setting of estrogen use, smoking, ulcerative colitis, taking Xarelto since 2016  Cardiac monitoring in December 2020 for 30 days, showed no significant abnormality  Most recent Doppler study of left lower extremity showed segmental nonocclusive deep venous thrombosis in  the mid left femoral vein,  She went through rehabilitation, was discharged home,  Today her main complaint is intermittent dizziness, started since  February, she complains sudden onset spinning sensation, with associated unsteady gait, nausea, blurry vision, oftentimes lateralized severe pounding headache it can last 3 to 4 days  Most recent episode was about 4 weeks ago, Tylenol only provide limited help, she denied hearing loss,  She had long history of headaches, few times each month, and will become this debilitating lateralize more severe headache with light noise sensitivity, Tylenol provide limited help  MRI of the brain with without contrast on Jan 02, 2021 showed no acute abnormality, remote infarction involving right basal ganglia, corona radiata, supratentorium small vessel disease, also had evidence of small vessel disease involving pons  She also complains of loud snoring, frequent awakening at nighttime, excessive daytime sleepiness fatigue  PHYSICAL EXAM:   Vitals:   01/12/21 1114  BP: (!) 137/91  Pulse: 77  Weight: 233 lb 8 oz (105.9 kg)  Height: 5' 4"  (1.626 m)   Not recorded     Body mass index is 40.08 kg/m.  PHYSICAL EXAMNIATION:  Gen: NAD, conversant, well nourised, well groomed                     Cardiovascular: Regular rate rhythm, no peripheral edema, warm, nontender. Eyes: Conjunctivae clear without exudates or hemorrhage Neck: Supple, no carotid bruits. Pulmonary: Clear to auscultation bilaterally   NEUROLOGICAL EXAM:  MENTAL STATUS: Speech:    Speech is normal; fluent and spontaneous with normal comprehension.  Cognition:     Orientation to time, place and person     Normal recent and remote memory     Normal Attention span and concentration     Normal Language, naming, repeating,spontaneous speech     Fund of knowledge   CRANIAL NERVES: Obese  CN II: Visual fields are full to confrontation. Pupils are round equal and briskly reactive to light. CN III, IV, VI: extraocular movement are normal. No ptosis. CN V: Facial sensation is intact to light touch CN VII: Face is symmetric with  normal eye closure  CN VIII: Hearing is normal to causal conversation. CN IX, X: Phonation is normal. CN XI: Head turning and shoulder shrug are intact CN XII: Narrow oropharyngeal space   MOTOR: Mild fixation of left arm upon rapid rotating movement  REFLEXES: Reflexes of left upper and lower extremity  SENSORY: Intact to light touch, pinprick and vibratory sensation are intact in fingers and toes.  COORDINATION: There is no trunk or limb dysmetria noted.  GAIT/STANCE: She needs push-up to get up from seated position, dragging left leg some mildly unsteady,  REVIEW OF SYSTEMS:  Full 14 system review of systems performed and notable only for as above All other review of systems were negative.   ALLERGIES: Allergies  Allergen Reactions  . Zoloft [Sertraline Hcl] Diarrhea    HOME MEDICATIONS: Current Outpatient Medications  Medication Sig Dispense Refill  . acetaminophen (TYLENOL) 325 MG tablet Take 2 tablets (650 mg total) by mouth every 4 (four) hours as needed for mild pain (or temp > 37.5 C (99.5 F)).    Marland Kitchen albuterol (VENTOLIN HFA) 108 (90 Base) MCG/ACT inhaler INHALE 2 PUFFS INTO THE LUNGS EVERY 4 (FOUR) HOURS AS NEEDED FOR WHEEZING, COUGH, OR SHORTNESS OF BREATH 6.7 each 3  . atorvastatin (LIPITOR) 80 MG tablet TAKE 1 TABLET (80 MG TOTAL) BY MOUTH DAILY AT 6 PM. 90 tablet 1  .  buPROPion (WELLBUTRIN XL) 300 MG 24 hr tablet Take 1 tablet (300 mg total) by mouth daily. 90 tablet 3  . citalopram (CELEXA) 20 MG tablet Take 1 tablet (20 mg total) by mouth daily. 90 tablet 3  . colestipol (COLESTID) 1 g tablet TAKE 2 TABLETS (2 G TOTAL) BY MOUTH DAILY. 180 tablet 0  . DERMA-SMOOTHE/FS SCALP 0.01 % OIL Apply 1 a small amount to skin three times a week  Part hair, apply to scalp, cover with plastic cap, wash out in the morning    . fexofenadine (ALLEGRA) 180 MG tablet Take 180 mg by mouth daily as needed.     . fluticasone (FLONASE) 50 MCG/ACT nasal spray Place 2 sprays into  both nostrils daily. 16 g 6  . ketoconazole (NIZORAL) 2 % shampoo Apply 1 application topically 2 (two) times a week.    . loperamide (IMODIUM) 2 MG capsule Take 1 capsule (2 mg total) by mouth as needed for diarrhea or loose stools. (Patient taking differently: Take 2 mg by mouth daily.) 30 capsule 0  . mesalamine (LIALDA) 1.2 g EC tablet Take 4 tablets (4.8 g total) by mouth daily with breakfast. 120 tablet 2  . methocarbamol (ROBAXIN) 500 MG tablet TAKE 2 TABLETS (1,000 MG TOTAL) BY MOUTH 2 (TWO) TIMES DAILY AS NEEDED FOR MUSCLE SPASMS. 120 tablet 2  . pantoprazole (PROTONIX) 40 MG tablet Take 1 tablet (40 mg total) by mouth daily. 90 tablet 3  . PEG-KCl-NaCl-NaSulf-Na Asc-C (PLENVU) 140 g SOLR Per prep instructions as directed 1 each 0  . vitamin B-12 (CYANOCOBALAMIN) 100 MCG tablet Take 1 tablet (100 mcg total) by mouth daily. 1 tablet   . XARELTO 20 MG TABS tablet TAKE 1 TABLET (20 MG TOTAL) BY MOUTH DAILY WITH SUPPER. 90 tablet 3  . zolpidem (AMBIEN CR) 12.5 MG CR tablet TAKE 1 TABLET (12.5 MG TOTAL) BY MOUTH AT BEDTIME AS NEEDED. FOR SLEEP 30 tablet 3   No current facility-administered medications for this visit.    PAST MEDICAL HISTORY: Past Medical History:  Diagnosis Date  . Abdominal pain, unspecified site   . Allergic rhinitis   . Anxiety   . Asthma   . Depression   . DVT (deep venous thrombosis) ([place]) 2016  . DVT of deep femoral vein, left ([place])   . Fibroid uterus   . GERD (gastroesophageal reflux disease)   . Hypercholesterolemia   . Hypertension   . Internal hemorrhoid   . Irritable bowel syndrome   . Lymphocytic colitis 01/02/2015  . Pneumonia 1998  . Stroke ([place]) 2020  . Ulcerative colitis ([place])   . URI (upper respiratory infection)   . Uterine fibroid   . Vitamin B12 deficiency     PAST SURGICAL HISTORY: Past Surgical History:  Procedure Laterality Date  . CESAREAN SECTION  2004  . CHOLECYSTECTOMY     laparoscopic "gallstones"  .  ESOPHAGOGASTRODUODENOSCOPY (EGD) WITH PROPOFOL N/A 05/03/2014   Procedure: ESOPHAGOGASTRODUODENOSCOPY (EGD) WITH PROPOFOL;  Surgeon: Inda Castle, MD;  Location: [facility];  Service: Endoscopy;  Laterality: N/A;  . MYOMECTOMY    . UTERINE FIBROID SURGERY      FAMILY HISTORY: Family History  Problem Relation Age of Onset  . Pulmonary fibrosis Father   . Colon cancer Paternal Aunt   . Uterine cancer Paternal Grandmother        with mets to the colon   . Heart failure Maternal Grandmother   . Heart disease Paternal Uncle   . Heart disease  Paternal Aunt   . Kidney disease Cousin   . Hyperlipidemia Mother   . Mitral valve prolapse Mother   . Heart disease Mother        arrhythmia    SOCIAL HISTORY: Social History   Socioeconomic History  . Marital status: Legally Separated    Spouse name: Not on file  . Number of children: 1  . Years of education: two year college  . Highest education level: Associate degree: academic program  Occupational History  . Occupation: Admin. assistant  Tobacco Use  . Smoking status: Former Smoker    Packs/day: 0.10    Years: 10.00    Pack years: 1.00    Types: Cigarettes    Quit date: 12/07/2017    Years since quitting: 3.1  . Smokeless tobacco: Never Used  Vaping Use  . Vaping Use: Never used  Substance and Sexual Activity  . Alcohol use: Yes    Alcohol/week: 0.0 standard drinks    Comment: rarely  . Drug use: No  . Sexual activity: Yes    Birth control/protection: Pill  Other Topics Concern  . Not on file  Social History Narrative   Lives alone. Son is there part of the time.   Right-handed.   Caffeine use: estimates seven cans of soda per day (12 ounces each).   Social Determinants of Health   Financial Resource Strain: Not on file  Food Insecurity: Not on file  Transportation Needs: Not on file  Physical Activity: Not on file  Stress: Not on file  Social Connections: Not on file  Intimate Partner Violence: Not on file       Marcial Pacas, M.D. Ph.D.  [facility] [address] [place], [place] [address] Ph: [phone] Fax: [phone]  CC:  Carloyn Manner, MD [address] [place],  [place] [address]  Tower, Wynelle Fanny, MD

## 2021-01-12 NOTE — Addendum Note (Signed)
Addended by: Marcial Pacas on: 01/12/2021 12:34 PM   Modules accepted: Orders

## 2021-01-14 ENCOUNTER — Telehealth: Payer: Self-pay | Admitting: Neurology

## 2021-01-14 LAB — ANA W/REFLEX IF POSITIVE
Anti JO-1: <0.2 AI (ref 0.0–0.9)
Anti Nuclear Antibody (ANA): POSITIVE — AB
Centromere Ab Screen: 0.2 AI (ref 0.0–0.9)
Chromatin Ab SerPl-aCnc: 0.7 AI (ref 0.0–0.9)
ENA RNP Ab: <0.2 AI (ref 0.0–0.9)
ENA SM Ab Ser-aCnc: <0.2 AI (ref 0.0–0.9)
ENA SSA (RO) Ab: <0.2 AI (ref 0.0–0.9)
ENA SSB (LA) Ab: 1.4 AI — ABNORMAL HIGH (ref 0.0–0.9)
Scleroderma (Scl-70) (ENA) Antibody, IgG: <0.2 AI (ref 0.0–0.9)
dsDNA Ab: <1 IU/mL (ref 0–9)

## 2021-01-14 LAB — C-REACTIVE PROTEIN: CRP: 3 mg/L (ref 0–10)

## 2021-01-14 LAB — FOLATE: Folate: 11.4 ng/mL (ref >3.0)

## 2021-01-14 LAB — HOMOCYSTEINE: Homocysteine: 6.6 umol/L (ref 0.0–14.5)

## 2021-01-14 LAB — RPR: RPR Ser Ql: NONREACTIVE

## 2021-01-14 LAB — TSH: TSH: 1.66 u[IU]/mL (ref 0.450–4.500)

## 2021-01-14 LAB — VITAMIN B12: Vitamin B-12: 474 pg/mL (ref 232–1245)

## 2021-01-14 LAB — HGB A1C W/O EAG: Hgb A1c MFr Bld: 6.3 % — ABNORMAL HIGH (ref 4.8–5.6)

## 2021-01-14 NOTE — Telephone Encounter (Signed)
Please call patient, laboratory evaluation showed positive ANA, with positive SSB,  Please check with her if she has any dry eye, dry mouth, above testing over indicate underlying connective tissue disorder, such as Sjogren's disease  Elevated A1c 6.3, indicate elevated glucose level over the past few months, she should start by diet, exercise  I have forward her lab results to her primary care Tower, Wynelle Fanny, MD

## 2021-01-14 NOTE — Telephone Encounter (Signed)
Left message requesting a return call.

## 2021-01-14 NOTE — Telephone Encounter (Signed)
The patient called back and I provided her with the lab results below. She verbalized understanding and will follow up with her PCP for further evaluation.

## 2021-01-19 ENCOUNTER — Ambulatory Visit: Payer: No Typology Code available for payment source | Admitting: Family Medicine

## 2021-01-30 ENCOUNTER — Encounter: Payer: Self-pay | Admitting: Gastroenterology

## 2021-02-02 ENCOUNTER — Ambulatory Visit: Payer: 59 | Admitting: Diagnostic Neuroimaging

## 2021-02-02 ENCOUNTER — Telehealth: Payer: Self-pay | Admitting: Gastroenterology

## 2021-02-02 NOTE — Telephone Encounter (Signed)
Pt has been having sinus issues and cold. She is wondering if she still can have procedure on Wednesday. Pls call her.

## 2021-02-02 NOTE — Telephone Encounter (Signed)
Dr Silverio Decamp, Please advise

## 2021-02-02 NOTE — Telephone Encounter (Signed)
ok 

## 2021-02-02 NOTE — Telephone Encounter (Signed)
It may be safer to do a covid test prior to the procedure given new symptoms of sinus congestion, can you please recommend patient to do Covid test, can be scheduled at any pharmacy or testing site. Thanks

## 2021-02-02 NOTE — Telephone Encounter (Signed)
Dr Silverio Decamp, Juluis Rainier- I called patient and informed her about the Covid test and she said she just felt too sick to come in. She rescheduled till 6/22 and aware of her Xarelto hold with the change of date

## 2021-02-04 ENCOUNTER — Encounter: Payer: Self-pay | Admitting: Family Medicine

## 2021-02-04 ENCOUNTER — Telehealth (INDEPENDENT_AMBULATORY_CARE_PROVIDER_SITE_OTHER): Payer: No Typology Code available for payment source | Admitting: Family Medicine

## 2021-02-04 ENCOUNTER — Encounter: Payer: No Typology Code available for payment source | Admitting: Gastroenterology

## 2021-02-04 DIAGNOSIS — J329 Chronic sinusitis, unspecified: Secondary | ICD-10-CM

## 2021-02-04 DIAGNOSIS — B9689 Other specified bacterial agents as the cause of diseases classified elsewhere: Secondary | ICD-10-CM

## 2021-02-04 DIAGNOSIS — H9201 Otalgia, right ear: Secondary | ICD-10-CM

## 2021-02-04 MED ORDER — CEFDINIR 300 MG PO CAPS: 300.0000 mg | ORAL_CAPSULE | Freq: Two times a day (BID) | ORAL | 0 refills | Status: DC

## 2021-02-04 NOTE — Progress Notes (Signed)
VIRTUAL VISIT VIA VIDEO  I connected with Carol Johnson on 02/04/21 at  3:00 PM EDT by elemedicine application and verified that I am speaking with the correct person using two identifiers. Location patient: Home Location provider: [facility], Office Persons participating in the virtual visit: Patient, Dr. Raoul Pitch and Darnell Level. Cesar, CMA  I discussed the limitations of evaluation and management by telemedicine and the availability of in person appointments. The patient expressed understanding and agreed to proceed.  Patient Care Team    Relationship Specialty Notifications Start End  Tower, Wynelle Fanny, MD PCP - General   08/12/10    Comment: Olena Heckle, MD PCP - Cardiology Cardiology Admissions 02/09/18   Carloyn Manner, MD Referring Physician Otolaryngology  01/12/21     SUBJECTIVE Chief Complaint  Patient presents with  . Cough    Pt c/o cough, ear pain, nasal congestion, HA, sweats, chills x 5 day    HPI: Carol Johnson is a 55 y.o. female pt present for acute illness work in.  Exposure: 3 family members ill. One tested negative to covid, others not tested.  Sx start: 6/3- nonproductive cough, ear pain -right, nasal congestion, headache, sinus pressure. Vaccine: covid pfizer x3 Pt endorses sweats, chills and fatigue.  Pt denies wheezing or shortness of breath,  Otc: Allegra, Mucinex, flonase GFR: >80, by lab review in EMR Anticoag: xarelto (stroke history) She seems to be more concerned over ear and her vertigo dx from ENT. She is worried this infection will also "go to her ear and cause ear to be off balanced." She states she does have a referral in to PT- vestibular rehab, but has to wait until this illness is over before she can go. She has a h/o asthma, but reports she is not have symptoms in her chest.    ROS: See pertinent positives and negatives per HPI.  Patient Active Problem List   Diagnosis Date Noted  . Cerebrovascular accident ([place]) 01/12/2021   . Chronic neck pain 01/12/2021  . Gait abnormality 01/12/2021  . Snores 01/12/2021  . Vertigo 10/29/2020  . Hearing loss 10/29/2020  . Viral URI with cough 09/19/2020  . Hypercoagulable state ([place]) 12/18/2019  . Hair loss 12/03/2019  . Aneurysm of anterior cerebral artery 12/03/2019  . Abnormality of gait 09/04/2019  . Hemiparesis affecting nondominant side as late effect of stroke ([place]) 07/24/2019  . Muscle weakness (generalized) 06/11/2019  . Hemiplegia and hemiparesis following cerebral infarction affecting left non-dominant side ([place]) 06/11/2019  . Major depressive disorder   . Hypokalemia   . Acute blood loss anemia   . Ulcerative colitis with complication ([place])   . H/O: CVA (cerebrovascular accident) 05/23/2019  . Thrombocytosis 05/23/2019  . Coronary artery disease involving native coronary artery of native heart without angina pectoris 03/21/2018  . Coronary artery calcification 02/10/2018  . Obesity (BMI 30-39.9) 02/10/2018  . Former smoker 01/15/2018  . Dyspnea on exertion 12/21/2017  . Carpal tunnel syndrome 12/21/2017  . Stress incontinence 12/21/2017  . Paresthesia of both feet 07/04/2017  . Always thirsty 07/04/2017  . Abdominal bloating 12/01/2015  . Fatigue 12/01/2015  . Vitamin B12 deficiency 12/01/2015  . Lymphocytic colitis 01/16/2015  . Anemia, iron deficiency 01/16/2015  . Anxiety state 12/20/2014  . Diarrhea 11/27/2014  . Insomnia 11/27/2014  . History of DVT (deep vein thrombosis) 09/17/2014  . OSA (obstructive sleep apnea) 09/02/2014  . Gastritis, chronic 06/06/2014  . Unspecified gastritis and gastroduodenitis without mention of hemorrhage 05/03/2014  .  Abdominal pain, epigastric 04/19/2014  . Internal hemorrhoids with other complication 68/06/5725  . Routine general medical examination at a health care facility 10/14/2011  . Thyroid nodule 05/16/2011  . Rectal bleeding 11/24/2010  . Hemorrhoids, internal 11/24/2010  . Irritable bowel syndrome  (IBS) 11/24/2010  . ANEMIA 10/02/2009  . Hyperlipidemia 07/18/2009  . Essential hypertension 07/18/2009  . DYSPNEA ON EXERTION 07/18/2009  . THYROID CYST 01/31/2008  . ANXIETY 01/31/2008  . Depression with anxiety 01/31/2008  . GASTRIC ULCER, HX OF 01/31/2008  . FIBROIDS, UTERUS 11/13/2007  . Allergic rhinitis 11/13/2007  . Asthma 11/13/2007  . GERD 11/13/2007  . IRRITABLE BOWEL SYNDROME 11/13/2007  . INTERNAL HEMORRHOIDS 11/12/2003    Social History   Tobacco Use  . Smoking status: Former Smoker    Packs/day: 0.10    Years: 10.00    Pack years: 1.00    Types: Cigarettes    Quit date: 12/07/2017    Years since quitting: 3.1  . Smokeless tobacco: Never Used  Substance Use Topics  . Alcohol use: Yes    Alcohol/week: 0.0 standard drinks    Comment: rarely    Current Outpatient Medications:  .  acetaminophen (TYLENOL) 325 MG tablet, Take 2 tablets (650 mg total) by mouth every 4 (four) hours as needed for mild pain (or temp > 37.5 C (99.5 F))., Disp:  , Rfl:  .  albuterol (VENTOLIN HFA) 108 (90 Base) MCG/ACT inhaler, INHALE 2 PUFFS INTO THE LUNGS EVERY 4 (FOUR) HOURS AS NEEDED FOR WHEEZING, COUGH, OR SHORTNESS OF BREATH, Disp: 6.7 each, Rfl: 3 .  atorvastatin (LIPITOR) 80 MG tablet, TAKE 1 TABLET (80 MG TOTAL) BY MOUTH DAILY AT 6 PM., Disp: 90 tablet, Rfl: 1 .  buPROPion (WELLBUTRIN XL) 300 MG 24 hr tablet, Take 1 tablet (300 mg total) by mouth daily., Disp: 90 tablet, Rfl: 3 .  butalbital-acetaminophen-caffeine (FIORICET) 50-325-40 MG tablet, Take 1 tablet by mouth every 6 (six) hours as needed for headache., Disp: 12 tablet, Rfl: 5 .  cefdinir (OMNICEF) 300 MG capsule, Take 1 capsule (300 mg total) by mouth 2 (two) times daily., Disp: 20 capsule, Rfl: 0 .  citalopram (CELEXA) 20 MG tablet, Take 1 tablet (20 mg total) by mouth daily., Disp: 90 tablet, Rfl: 3 .  colestipol (COLESTID) 1 g tablet, TAKE 2 TABLETS (2 G TOTAL) BY MOUTH DAILY., Disp: 180 tablet, Rfl: 0 .   DERMA-SMOOTHE/FS SCALP 0.01 % OIL, Apply 1 a small amount to skin three times a week  Part hair, apply to scalp, cover with plastic cap, wash out in the morning, Disp: , Rfl:  .  fexofenadine (ALLEGRA) 180 MG tablet, Take 180 mg by mouth daily as needed. , Disp: , Rfl:  .  fluticasone (FLONASE) 50 MCG/ACT nasal spray, Place 2 sprays into both nostrils daily., Disp: 16 g, Rfl: 6 .  ketoconazole (NIZORAL) 2 % shampoo, Apply 1 application topically 2 (two) times a week., Disp: , Rfl:  .  loperamide (IMODIUM) 2 MG capsule, Take 1 capsule (2 mg total) by mouth as needed for diarrhea or loose stools. (Patient taking differently: Take 2 mg by mouth daily.), Disp: 30 capsule, Rfl: 0 .  mesalamine (LIALDA) 1.2 g EC tablet, Take 4 tablets (4.8 g total) by mouth daily with breakfast., Disp: 120 tablet, Rfl: 2 .  methocarbamol (ROBAXIN) 500 MG tablet, TAKE 2 TABLETS (1,000 MG TOTAL) BY MOUTH 2 (TWO) TIMES DAILY AS NEEDED FOR MUSCLE SPASMS., Disp: 120 tablet, Rfl: 2 .  pantoprazole (PROTONIX)  40 MG tablet, Take 1 tablet (40 mg total) by mouth daily., Disp: 90 tablet, Rfl: 3 .  PEG-KCl-NaCl-NaSulf-Na Asc-C (PLENVU) 140 g SOLR, Per prep instructions as directed, Disp: 1 each, Rfl: 0 .  topiramate (TOPAMAX) 100 MG tablet, Take 1 tablet (100 mg total) by mouth 2 (two) times daily., Disp: 60 tablet, Rfl: 11 .  vitamin B-12 (CYANOCOBALAMIN) 100 MCG tablet, Take 1 tablet (100 mcg total) by mouth daily., Disp: 1 tablet, Rfl:  .  XARELTO 20 MG TABS tablet, TAKE 1 TABLET (20 MG TOTAL) BY MOUTH DAILY WITH SUPPER., Disp: 90 tablet, Rfl: 3 .  zolpidem (AMBIEN CR) 12.5 MG CR tablet, TAKE 1 TABLET (12.5 MG TOTAL) BY MOUTH AT BEDTIME AS NEEDED. FOR SLEEP, Disp: 30 tablet, Rfl: 3  Allergies  Allergen Reactions  . Zoloft [Sertraline Hcl] Diarrhea    OBJECTIVE: LMP 11/29/2015  Gen: No acute distress. Nontoxic in appearance.  HENT: AT. [place].  MMM.  Eyes:Pupils Equal Round Reactive to light, Extraocular movements intact,   Conjunctiva without redness, discharge or icterus. Chest: Cough very mild- present.  Skin: no rashes, purpura or petechiae.  Neuro: Alert. Oriented x3    ASSESSMENT AND PLAN: Carol Johnson is a 55 y.o. female present for  Bacterial sinusitis/Right ear pain Rest, hydrate.  Continue  flonase, mucinex, allegra, nettie pot or nasal saline.  omnicef BID prescribed, take until completed.  Offered short course of prednisone and pt declined.  Offered covid testing at her [place] office- pt declined and did not want to wait in a line.  F/U 2 weeks if not improved.    Howard Pouch, DO 02/04/2021   No follow-ups on file.  No orders of the defined types were placed in this encounter.  Meds ordered this encounter  Medications  . cefdinir (OMNICEF) 300 MG capsule    Sig: Take 1 capsule (300 mg total) by mouth 2 (two) times daily.    Dispense:  20 capsule    Refill:  0   Referral Orders  No referral(s) requested today

## 2021-02-04 NOTE — Patient Instructions (Signed)
Vertigo Vertigo is the feeling that you or the things around you are moving when they are not. This feeling can come and go at any time. Vertigo often goes away on its own. This condition can be dangerous if it happens when you are doing activities like driving or working with machines. Your doctor will do tests to find the cause of your vertigo. These tests will also help your doctor decide on the best treatment for you. Follow these instructions at home: Eating and drinking  Drink enough fluid to keep your pee (urine) pale yellow.  Do not drink alcohol.      Activity  Return to your normal activities as told by your doctor. Ask your doctor what activities are safe for you.  In the morning, first sit up on the side of the bed. When you feel okay, stand slowly while you hold onto something until you know that your balance is fine.  Move slowly. Avoid sudden body or head movements or certain positions, as told by your doctor.  Use a cane if you have trouble standing or walking.  Sit down right away if you feel dizzy.  Avoid doing any tasks or activities that can cause danger to you or others if you get dizzy.  Avoid bending down if you feel dizzy. Place items in your home so that they are easy for you to reach without leaning over.  Do not drive or use heavy machinery if you feel dizzy. General instructions  Take over-the-counter and prescription medicines only as told by your doctor.  Keep all follow-up visits as told by your doctor. This is important. Contact a doctor if:  Your medicine does not help your vertigo.  You have a fever.  Your problems get worse or you have new symptoms.  Your family or friends see changes in your behavior.  The feeling of being sick to your stomach gets worse.  Your vomiting gets worse.  You lose feeling (have numbness) in part of your body.  You feel prickling and tingling in a part of your body. Get help right away if:  You have  trouble moving or talking.  You are always dizzy.  You pass out (faint).  You get very bad headaches.  You feel weak in your hands, arms, or legs.  You have changes in your hearing.  You have changes in how you see (vision).  You get a stiff neck.  Bright light starts to bother you. Summary  Vertigo is the feeling that you or the things around you are moving when they are not.  Your doctor will do tests to find the cause of your vertigo.  You may be told to avoid some tasks, positions, or movements.  Contact a doctor if your medicine is not helping, or if you have a fever, new symptoms, or a change in behavior.  Get help right away if you get very bad headaches, or if you have changes in how you speak, hear, or see. This information is not intended to replace advice given to you by your health care provider. Make sure you discuss any questions you have with your health care provider. Document Revised: 07/10/2018 Document Reviewed: 07/10/2018 Elsevier Patient Education  2021 [employer].   Sinusitis, Adult Sinusitis is soreness and swelling (inflammation) of your sinuses. Sinuses are hollow spaces in the bones around your face. They are located:  Around your eyes.  In the middle of your forehead.  Behind your nose.  In  your cheekbones. Your sinuses and nasal passages are lined with a fluid called mucus. Mucus drains out of your sinuses. Swelling can trap mucus in your sinuses. This lets germs (bacteria, virus, or fungus) grow, which leads to infection. Most of the time, this condition is caused by a virus. What are the causes? This condition is caused by:  Allergies.  Asthma.  Germs.  Things that block your nose or sinuses.  Growths in the nose (nasal polyps).  Chemicals or irritants in the air.  Fungus (rare). What increases the risk? You are more likely to develop this condition if:  You have a weak body defense system (immune system).  You do a lot  of swimming or diving.  You use nasal sprays too much.  You smoke. What are the signs or symptoms? The main symptoms of this condition are pain and a feeling of pressure around the sinuses. Other symptoms include:  Stuffy nose (congestion).  Runny nose (drainage).  Swelling and warmth in the sinuses.  Headache.  Toothache.  A cough that may get worse at night.  Mucus that collects in the throat or the back of the nose (postnasal drip).  Being unable to smell and taste.  Being very tired (fatigue).  A fever.  Sore throat.  Bad breath. How is this diagnosed? This condition is diagnosed based on:  Your symptoms.  Your medical history.  A physical exam.  Tests to find out if your condition is short-term (acute) or long-term (chronic). Your doctor may: ? Check your nose for growths (polyps). ? Check your sinuses using a tool that has a light (endoscope). ? Check for allergies or germs. ? Do imaging tests, such as an MRI or CT scan. How is this treated? Treatment for this condition depends on the cause and whether it is short-term or long-term.  If caused by a virus, your symptoms should go away on their own within 10 days. You may be given medicines to relieve symptoms. They include: ? Medicines that shrink swollen tissue in the nose. ? Medicines that treat allergies (antihistamines). ? A spray that treats swelling of the nostrils. ? Rinses that help get rid of thick mucus in your nose (nasal saline washes).  If caused by bacteria, your doctor may wait to see if you will get better without treatment. You may be given antibiotic medicine if you have: ? A very bad infection. ? A weak body defense system.  If caused by growths in the nose, you may need to have surgery. Follow these instructions at home: Medicines  Take, use, or apply over-the-counter and prescription medicines only as told by your doctor. These may include nasal sprays.  If you were prescribed  an antibiotic medicine, take it as told by your doctor. Do not stop taking the antibiotic even if you start to feel better. Hydrate and humidify  Drink enough water to keep your pee (urine) pale yellow.  Use a cool mist humidifier to keep the humidity level in your home above 50%.  Breathe in steam for 10-15 minutes, 3-4 times a day, or as told by your doctor. You can do this in the bathroom while a hot shower is running.  Try not to spend time in cool or dry air.   Rest  Rest as much as you can.  Sleep with your head raised (elevated).  Make sure you get enough sleep each night. General instructions  Put a warm, moist washcloth on your face 3-4 times a day,  or as often as told by your doctor. This will help with discomfort.  Wash your hands often with soap and water. If there is no soap and water, use hand sanitizer.  Do not smoke. Avoid being around people who are smoking (secondhand smoke).  Keep all follow-up visits as told by your doctor. This is important.   Contact a doctor if:  You have a fever.  Your symptoms get worse.  Your symptoms do not get better within 10 days. Get help right away if:  You have a very bad headache.  You cannot stop throwing up (vomiting).  You have very bad pain or swelling around your face or eyes.  You have trouble seeing.  You feel confused.  Your neck is stiff.  You have trouble breathing. Summary  Sinusitis is swelling of your sinuses. Sinuses are hollow spaces in the bones around your face.  This condition is caused by tissues in your nose that become inflamed or swollen. This traps germs. These can lead to infection.  If you were prescribed an antibiotic medicine, take it as told by your doctor. Do not stop taking it even if you start to feel better.  Keep all follow-up visits as told by your doctor. This is important. This information is not intended to replace advice given to you by your health care provider. Make sure  you discuss any questions you have with your health care provider. Document Revised: 01/16/2018 Document Reviewed: 01/16/2018 Elsevier Patient Education  2021 [employer].

## 2021-02-17 ENCOUNTER — Encounter: Payer: Self-pay | Admitting: Certified Registered Nurse Anesthetist

## 2021-02-17 ENCOUNTER — Telehealth: Payer: Self-pay | Admitting: Gastroenterology

## 2021-02-17 NOTE — Telephone Encounter (Signed)
Returned patients call, answered all questions about prep...  She was concerned with the times to start the colon prep since she changed her appointment date

## 2021-02-17 NOTE — Telephone Encounter (Signed)
Inbound call from pt requesting a call back stating she needed to know when to take her prep for her colon for tomorrow. Please advise. Thanks

## 2021-02-18 ENCOUNTER — Other Ambulatory Visit: Payer: Self-pay

## 2021-02-18 ENCOUNTER — Encounter: Payer: Self-pay | Admitting: Gastroenterology

## 2021-02-18 ENCOUNTER — Ambulatory Visit (AMBULATORY_SURGERY_CENTER): Payer: No Typology Code available for payment source | Admitting: Gastroenterology

## 2021-02-18 VITALS — BP 130/76 | HR 75 | Temp 98.7°F | Resp 22 | Ht 61.5 in | Wt 226.0 lb

## 2021-02-18 DIAGNOSIS — D122 Benign neoplasm of ascending colon: Secondary | ICD-10-CM

## 2021-02-18 DIAGNOSIS — K52832 Lymphocytic colitis: Secondary | ICD-10-CM

## 2021-02-18 DIAGNOSIS — K648 Other hemorrhoids: Secondary | ICD-10-CM | POA: Diagnosis not present

## 2021-02-18 DIAGNOSIS — R197 Diarrhea, unspecified: Secondary | ICD-10-CM

## 2021-02-18 MED ORDER — SODIUM CHLORIDE 0.9 % IV SOLN
500.0000 mL | INTRAVENOUS | Status: DC
Start: 2021-02-18 — End: 2021-02-18

## 2021-02-18 NOTE — Patient Instructions (Signed)
Discharge instructions given. Handouts on polyps and hemorrhoids. Resume previous medications. YOU HAD AN ENDOSCOPIC PROCEDURE TODAY AT [facility]:   Refer to the procedure report that was given to you for any specific questions about what was found during the examination.  If the procedure report does not answer your questions, please call your gastroenterologist to clarify.  If you requested that your care partner not be given the details of your procedure findings, then the procedure report has been included in a sealed envelope for you to review at your convenience later.  YOU SHOULD EXPECT: Some feelings of bloating in the abdomen. Passage of more gas than usual.  Walking can help get rid of the air that was put into your GI tract during the procedure and reduce the bloating. If you had a lower endoscopy (such as a colonoscopy or flexible sigmoidoscopy) you may notice spotting of blood in your stool or on the toilet paper. If you underwent a bowel prep for your procedure, you may not have a normal bowel movement for a few days.  Please Note:  You might notice some irritation and congestion in your nose or some drainage.  This is from the oxygen used during your procedure.  There is no need for concern and it should clear up in a day or so.  SYMPTOMS TO REPORT IMMEDIATELY:  Following lower endoscopy (colonoscopy or flexible sigmoidoscopy):  Excessive amounts of blood in the stool  Significant tenderness or worsening of abdominal pains  Swelling of the abdomen that is new, acute  Fever of 100F or higher   For urgent or emergent issues, a gastroenterologist can be reached at any hour by calling [phone]. Do not use MyChart messaging for urgent concerns.    DIET:  We do recommend a small meal at first, but then you may proceed to your regular diet.  Drink plenty of fluids but you should avoid alcoholic beverages for 24 hours.  ACTIVITY:  You should plan to take it  easy for the rest of today and you should NOT DRIVE or use heavy machinery until tomorrow (because of the sedation medicines used during the test).    FOLLOW UP: Our staff will call the number listed on your records 48-72 hours following your procedure to check on you and address any questions or concerns that you may have regarding the information given to you following your procedure. If we do not reach you, we will leave a message.  We will attempt to reach you two times.  During this call, we will ask if you have developed any symptoms of COVID 19. If you develop any symptoms (ie: fever, flu-like symptoms, shortness of breath, cough etc.) before then, please call [phone].  If you test positive for Covid 19 in the 2 weeks post procedure, please call and report this information to [place].    If any biopsies were taken you will be contacted by phone or by letter within the next 1-3 weeks.  Please call us at [phone] if you have not heard about the biopsies in 3 weeks.    SIGNATURES/CONFIDENTIALITY: You and/or your care partner have signed paperwork which will be entered into your electronic medical record.  These signatures attest to the fact that that the information above on your After Visit Summary has been reviewed and is understood.  Full responsibility of the confidentiality of this discharge information lies with you and/or your care-partner.

## 2021-02-18 NOTE — Progress Notes (Signed)
Report given to PACU, vss 

## 2021-02-18 NOTE — Progress Notes (Signed)
Called to room to assist during endoscopic procedure.  Patient ID and intended procedure confirmed with present staff. Received instructions for my participation in the procedure from the performing physician.  

## 2021-02-18 NOTE — Op Note (Addendum)
[name] Procedure Date: 02/18/2021 2:01 PM MRN: [id number] Endoscopist: Mauri Pole , MD Age: 55 Referring MD:  Date of Birth: Feb 27, 1966 Gender: Female Account #: [id number] Procedure:                Colonoscopy Indications:              Obtain more precise diagnosis of inflammatory bowel                            disease, Clinically significant diarrhea of                            unexplained origin Medicines:                Monitored Anesthesia Care Procedure:                Pre-Anesthesia Assessment:                           - Prior to the procedure, a History and Physical                            was performed, and patient medications and                            allergies were reviewed. The patient's tolerance of                            previous anesthesia was also reviewed. The risks                            and benefits of the procedure and the sedation                            options and risks were discussed with the patient.                            All questions were answered, and informed consent                            was obtained. Prior Anticoagulants: The patient                            last took Xarelto (rivaroxaban) 2 days prior to the                            procedure. ASA Grade Assessment: III - A patient                            with severe systemic disease. After reviewing the                            risks and benefits, the patient was deemed in  satisfactory condition to undergo the procedure.                           After obtaining informed consent, the colonoscope                            was passed under direct vision. Throughout the                            procedure, the patient's blood pressure, pulse, and                            oxygen saturations were monitored continuously. The                            Olympus PCF-H190DL (#[id number])  Colonoscope was                            introduced through the anus and advanced to the the                            cecum, identified by appendiceal orifice and                            ileocecal valve. The colonoscopy was performed                            without difficulty. The patient tolerated the                            procedure well. The quality of the bowel                            preparation was excellent. The ileocecal valve,                            appendiceal orifice, and rectum were photographed. Scope In: 2:18:05 PM Scope Out: 2:31:11 PM Scope Withdrawal Time: 0 hours 8 minutes 19 seconds  Total Procedure Duration: 0 hours 13 minutes 6 seconds  Findings:                 The perianal and digital rectal examinations were                            normal.                           A less than 1 mm polyp was found in the ascending                            colon. The polyp was sessile. The polyp was removed                            with a cold biopsy forceps. Resection and retrieval  were complete.                           Normal mucosa was found in the entire colon.                            Biopsies for histology were taken with a cold                            forceps from the right colon and left colon for                            evaluation of microscopic colitis.                           Non-bleeding external and internal hemorrhoids were                            found during retroflexion. The hemorrhoids were                            large. Complications:            No immediate complications. Estimated Blood Loss:     Estimated blood loss was minimal. Impression:               - One less than 1 mm polyp in the ascending colon,                            removed with a cold biopsy forceps. Resected and                            retrieved.                           - Normal mucosa in the entire examined colon.                             Biopsied.                           - Non-bleeding external and internal hemorrhoids. Recommendation:           - Patient has a contact number available for                            emergencies. The signs and symptoms of potential                            delayed complications were discussed with the                            patient. Return to normal activities tomorrow.                            Written discharge instructions were provided to the  patient.                           - Resume previous diet.                           - Continue present medications.                           - Await pathology results.                           - Repeat colonoscopy in 5 years for surveillance                            based on pathology results.                           - Resume Xarelto (rivaroxaban) at prior dose                            tomorrow. Refer to managing physician for further                            adjustment of therapy. Mauri Pole, MD 02/18/2021 2:40:27 PM This report has been signed electronically.

## 2021-02-20 ENCOUNTER — Telehealth: Payer: Self-pay

## 2021-02-20 ENCOUNTER — Telehealth: Payer: Self-pay | Admitting: *Deleted

## 2021-02-20 NOTE — Telephone Encounter (Signed)
  Follow up Call-  Call back number 02/18/2021  Post procedure Call Back phone  # [phone]  Permission to leave phone message Yes  Some recent data might be hidden     Patient questions:  Message left to call us if necessary.

## 2021-02-20 NOTE — Telephone Encounter (Signed)
First attempt follow up call to pt, no answer.

## 2021-03-16 ENCOUNTER — Institutional Professional Consult (permissible substitution): Payer: No Typology Code available for payment source | Admitting: Neurology

## 2021-04-01 ENCOUNTER — Other Ambulatory Visit: Payer: Self-pay | Admitting: Family

## 2021-04-01 ENCOUNTER — Other Ambulatory Visit: Payer: Self-pay | Admitting: Gastroenterology

## 2021-04-01 DIAGNOSIS — J069 Acute upper respiratory infection, unspecified: Secondary | ICD-10-CM

## 2021-04-23 ENCOUNTER — Encounter: Payer: Self-pay | Admitting: Neurology

## 2021-04-23 ENCOUNTER — Ambulatory Visit: Payer: No Typology Code available for payment source | Admitting: Neurology

## 2021-04-23 VITALS — BP 133/85 | HR 97 | Ht 61.5 in | Wt 245.6 lb

## 2021-04-23 DIAGNOSIS — Z6841 Body Mass Index (BMI) 40.0 and over, adult: Secondary | ICD-10-CM

## 2021-04-23 DIAGNOSIS — R0683 Snoring: Secondary | ICD-10-CM

## 2021-04-23 DIAGNOSIS — Z8673 Personal history of transient ischemic attack (TIA), and cerebral infarction without residual deficits: Secondary | ICD-10-CM

## 2021-04-23 DIAGNOSIS — G4719 Other hypersomnia: Secondary | ICD-10-CM

## 2021-04-23 DIAGNOSIS — R351 Nocturia: Secondary | ICD-10-CM

## 2021-04-23 NOTE — Progress Notes (Signed)
Subjective:    Patient ID: Carol Johnson is a 55 y.o. female.  HPI    Star Age, MD, PhD [facility] [address], [address] P.O. [place],  [address]  Dear Carol Johnson,   I saw your patient, Carol Johnson, upon your kind request in my sleep clinic today for initial consultation of her sleep disorder, in particular, concern for underlying obstructive sleep apnea.  The patient is unaccompanied today.  As you know, Carol Johnson is a 55 year old right-handed woman with an underlying medical history of hypertension, hyperlipidemia, stroke, ulcerative colitis, vitamin B12 deficiency, DVT, reflux disease, irritable bowel syndrome, recurrent headaches, pneumonia, anxiety, depression, asthma, allergic rhinitis, and severe obesity with a BMI of over 45, who reports snoring and excessive daytime somnolence.  I reviewed your office note from 01/12/2021.  Her Epworth sleepiness score is 10 out of 24, fatigue severity score is 48 out of 63.  She lives with her son who just went off to college.  She works as an Scientist, water quality.  She goes to bed generally around 11 PM.  She takes Ambien CR for sleep.  She has been on it for some years.  This is prescribed by her primary care physician.  Rise time is generally around 7:15 AM.  She has nocturia about once per average night, denies recurrent morning headaches.  She had a home sleep test some 5 years ago and was not diagnosed with sleep apnea and was not offered CPAP therapy.  She drinks caffeine in the form of soda, about 5 cans/day and 1 cup of coffee in the morning.  She quit smoking some 4 years ago.  She drinks alcohol rarely.  Her weight has been fluctuating, she has had recent weight gain.   Her Past Medical History Is Significant For: Past Medical History:  Diagnosis Date   Abdominal pain, unspecified site    Allergic rhinitis    Anxiety    Asthma    Depression    DVT (deep venous thrombosis) ([place] [facility]) 2016   DVT of deep  femoral vein, left ([facility])    Fibroid uterus    GERD (gastroesophageal reflux disease)    Hypercholesterolemia    Hypertension    Internal hemorrhoid    Irritable bowel syndrome    Lymphocytic colitis 01/02/2015   Pneumonia 1998   Stroke ([place]) 2020   left sided weekness   Ulcerative colitis ([place])    URI (upper respiratory infection)    Uterine fibroid    Vitamin B12 deficiency     Her Past Surgical History Is Significant For: Past Surgical History:  Procedure Laterality Date   CESAREAN SECTION  08/30/2002   CHOLECYSTECTOMY     laparoscopic "gallstones"   ESOPHAGOGASTRODUODENOSCOPY (EGD) WITH PROPOFOL N/A 05/03/2014   Procedure: ESOPHAGOGASTRODUODENOSCOPY (EGD) WITH PROPOFOL;  Surgeon: Inda Castle, MD;  Location: [facility];  Service: Endoscopy;  Laterality: N/A;   UTERINE FIBROID SURGERY      Her Family History Is Significant For: Family History  Problem Relation Age of Onset   Hyperlipidemia Mother    Mitral valve prolapse Mother    Heart disease Mother        arrhythmia   Pulmonary fibrosis Father    Colon cancer Paternal Aunt    Heart disease Paternal Aunt    Heart disease Paternal Uncle    Heart failure Maternal Grandmother    Uterine cancer Paternal Grandmother        with mets to the colon  Kidney disease Cousin    Esophageal cancer Neg Hx    Stomach cancer Neg Hx    Rectal cancer Neg Hx     Her Social History Is Significant For: Social History   Socioeconomic History   Marital status: Legally Separated    Spouse name: Not on file   Number of children: 1   Years of education: two year college   Highest education level: Associate degree: academic program  Occupational History   Occupation: Admin. assistant  Tobacco Use   Smoking status: Former    Packs/day: 0.10    Years: 10.00    Pack years: 1.00    Types: Cigarettes    Quit date: 12/07/2017    Years since quitting: 3.3   Smokeless tobacco: Never  Vaping Use   Vaping Use: Never used   Substance and Sexual Activity   Alcohol use: Yes    Alcohol/week: 0.0 standard drinks    Comment: rarely   Drug use: No   Sexual activity: Yes    Birth control/protection: Pill  Other Topics Concern   Not on file  Social History Narrative   Lives alone. Son is there part of the time.   Right-handed.   Caffeine use: estimates seven cans of soda per day (12 ounces each).   Social Determinants of Health   Financial Resource Strain: Not on file  Food Insecurity: Not on file  Transportation Needs: Not on file  Physical Activity: Not on file  Stress: Not on file  Social Connections: Not on file    Her Allergies Are:  Allergies  Allergen Reactions   Zoloft [Sertraline Hcl] Diarrhea  :   Her Current Medications Are:  Outpatient Encounter Medications as of 04/23/2021  Medication Sig   acetaminophen (TYLENOL) 325 MG tablet Take 2 tablets (650 mg total) by mouth every 4 (four) hours as needed for mild pain (or temp > 37.5 C (99.5 F)).   albuterol (VENTOLIN HFA) 108 (90 Base) MCG/ACT inhaler INHALE 2 PUFFS INTO THE LUNGS EVERY 4 (FOUR) HOURS AS NEEDED FOR WHEEZING, COUGH, OR SHORTNESS OF BREATH   atorvastatin (LIPITOR) 80 MG tablet TAKE 1 TABLET (80 MG TOTAL) BY MOUTH DAILY AT 6 PM.   buPROPion (WELLBUTRIN XL) 300 MG 24 hr tablet Take 1 tablet (300 mg total) by mouth daily.   citalopram (CELEXA) 20 MG tablet Take 1 tablet (20 mg total) by mouth daily.   DERMA-SMOOTHE/FS SCALP 0.01 % OIL Apply 1 a small amount to skin three times a week  Part hair, apply to scalp, cover with plastic cap, wash out in the morning   fexofenadine (ALLEGRA) 180 MG tablet Take 180 mg by mouth daily as needed.    fluticasone (FLONASE) 50 MCG/ACT nasal spray Place 2 sprays into both nostrils daily.   ketoconazole (NIZORAL) 2 % shampoo Apply 1 application topically 2 (two) times a week.   loperamide (IMODIUM) 2 MG capsule Take 1 capsule (2 mg total) by mouth as needed for diarrhea or loose stools. (Patient  taking differently: Take 2 mg by mouth daily.)   mesalamine (LIALDA) 1.2 g EC tablet TAKE 4 TABLETS BY MOUTH DAILY WITH BREAKFAST.   methocarbamol (ROBAXIN) 500 MG tablet TAKE 2 TABLETS (1,000 MG TOTAL) BY MOUTH 2 (TWO) TIMES DAILY AS NEEDED FOR MUSCLE SPASMS.   pantoprazole (PROTONIX) 40 MG tablet Take 1 tablet (40 mg total) by mouth daily.   vitamin B-12 (CYANOCOBALAMIN) 100 MCG tablet Take 1 tablet (100 mcg total) by mouth daily.  XARELTO 20 MG TABS tablet TAKE 1 TABLET (20 MG TOTAL) BY MOUTH DAILY WITH SUPPER.   zolpidem (AMBIEN CR) 12.5 MG CR tablet TAKE 1 TABLET (12.5 MG TOTAL) BY MOUTH AT BEDTIME AS NEEDED. FOR SLEEP   [DISCONTINUED] butalbital-acetaminophen-caffeine (FIORICET) 50-325-40 MG tablet Take 1 tablet by mouth every 6 (six) hours as needed for headache.   [DISCONTINUED] colestipol (COLESTID) 1 g tablet TAKE 2 TABLETS (2 G TOTAL) BY MOUTH DAILY.   [DISCONTINUED] topiramate (TOPAMAX) 100 MG tablet Take 1 tablet (100 mg total) by mouth 2 (two) times daily.   No facility-administered encounter medications on file as of 04/23/2021.  :   Review of Systems:  Out of a complete 14 point review of systems, all are reviewed and negative with the exception of these symptoms as listed below:   Review of Systems  Neurological:        Issues with sleep, no being able to sleep, ambien, (has been on xanxa). Snores.  Hx stroke. Epworth Sleepiness Scale 0= would never doze 1= slight chance of dozing 2= moderate chance of dozing 3= high chance of dozing  Sitting and reading:1 Watching TV:1 Sitting inactive in a public place (ex. Theater or meeting):1 As a passenger in a car for an hour without a break:1 Lying down to rest in the afternoon:2 Sitting and talking to someone:1 Sitting quietly after lunch (no alcohol):2 In a car, while stopped in traffic:1 Total: 10 FSS: 48   Objective:  Neurological Exam  Physical Exam Physical Examination:   Vitals:   04/23/21 1323  BP: 133/85   Pulse: 97    General Examination: The patient is a very pleasant 55 y.o. female in no acute distress. She appears well-developed and well-nourished and well groomed.   HEENT: Normocephalic, atraumatic, pupils are equal, round and reactive to light, extraocular tracking is good without limitation to gaze excursion or nystagmus noted. Hearing is grossly intact. Face is symmetric with normal facial animation. Speech is clear with no dysarthria noted. There is no hypophonia. There is no lip, neck/head, jaw or voice tremor. Neck is supple with full range of passive and active motion. There are no carotid bruits on auscultation. Oropharynx exam reveals: mild mouth dryness, good dental hygiene and moderate airway crowding, due to smaller airway entry, tonsillar size small, Mallampati class III.  Neck circumference of 17 inches.  She has a mild to minimal overbite.  Tongue protrudes centrally and palate elevates symmetrically.  Chest: Clear to auscultation without wheezing, rhonchi or crackles noted.  Heart: S1+S2+0, regular and normal without murmurs, rubs or gallops noted.   Abdomen: Soft, non-tender and non-distended with normal bowel sounds appreciated on auscultation.  Extremities: There is no pitting edema in the distal lower extremities bilaterally.   Skin: Warm and dry without trophic changes noted.   Musculoskeletal: exam reveals no obvious joint deformities, tenderness or joint swelling or erythema.   Neurologically:  Mental status: The patient is awake, alert and oriented in all 4 spheres. Her immediate and remote memory, attention, language skills and fund of knowledge are appropriate. There is no evidence of aphasia, agnosia, apraxia or anomia. Speech is clear with normal prosody and enunciation. Thought process is linear. Mood is normal and affect is normal.  Cranial nerves II - XII are as described above under HEENT exam.  Motor exam: Normal bulk, fairly normal global strength, no  obvious focal weakness detected.  She reports that her left side is weaker since her stroke.  Fine motor skills are  grossly intact.   Cerebellar testing: No dysmetria or intention tremor. There is no truncal or gait ataxia.  Sensory exam: intact to light touch in the upper and lower extremities.  Gait, station and balance: She stands with mild difficulty.  She requires no assistance, she has no walking aid.  She walks slightly slowly and cautiously.  No shuffling, she has preserved arm swing.   Assessment and Plan:  In summary, Carol Johnson is a very pleasant 55 y.o.-year old female with an underlying medical history of hypertension, hyperlipidemia, stroke, ulcerative colitis, vitamin B12 deficiency, DVT, reflux disease, irritable bowel syndrome, recurrent headaches, pneumonia, anxiety, depression, asthma, allergic rhinitis, and severe obesity with a BMI of over 70, whose history and physical exam are concerning for obstructive sleep apnea (OSA). I had a long chat with the patient about my findings and the diagnosis of OSA, its prognosis and treatment options. We talked about medical treatments, surgical interventions and non-pharmacological approaches. I explained in particular the risks and ramifications of untreated moderate to severe OSA, especially with respect to developing cardiovascular disease down the Road, including congestive heart failure, difficult to treat hypertension, cardiac arrhythmias, or stroke. Even type 2 diabetes has, in part, been linked to untreated OSA. Symptoms of untreated OSA include daytime sleepiness, memory problems, mood irritability and mood disorder such as depression and anxiety, lack of energy, as well as recurrent headaches, especially morning headaches. We talked about trying to maintain a healthy lifestyle in general, as well as the importance of weight control. We also talked about the importance of good sleep hygiene. I recommended the following at this time:  sleep study.  I explained the difference between a laboratory attended sleep study versus home sleep test.  I explained the sleep test procedure to the patient and also outlined possible surgical and non-surgical treatment options of OSA, including the use of a custom-made dental device (which would require a referral to a specialist dentist or oral surgeon), upper airway surgical options, such as traditional UPPP or a novel less invasive surgical option in the form of Inspire hypoglossal nerve stimulation (which would involve a referral to an ENT surgeon). I also explained the CPAP treatment option to the patient, who indicated that she would be willing to try CPAP if the need arises.  We will pick up our discussion after testing and also keep her posted as to her test results by phone call.  I answered all her questions today and she was in agreement. Thank you very much for allowing me to participate in the care of this nice patient. If I can be of any further assistance to you please do not hesitate to talk to me.  Sincerely,   Star Age, MD, PhD

## 2021-04-23 NOTE — Patient Instructions (Signed)
It was nice to meet you today!   Based on your symptoms and your exam I believe you are at risk for obstructive sleep apnea (aka OSA), and I think we should proceed with a sleep study to determine whether you do or do not have OSA and how severe it is. Even, if you have mild OSA, I may want you to consider treatment with CPAP, as treatment of even borderline or mild sleep apnea can result and improvement of symptoms such as sleep disruption, daytime sleepiness, nighttime bathroom breaks, restless leg symptoms, improvement of headache syndromes, even improved mood disorder.   As explained, an attended sleep study meaning you get to stay overnight in the sleep lab, lets [place] monitor sleep-related behaviors such as sleep talking and leg movements in sleep, in addition to monitoring for sleep apnea.  A home sleep test is a screening tool for sleep apnea only, and unfortunately does not help with any other sleep-related diagnoses.  Please remember, the long-term risks and ramifications of untreated moderate to severe obstructive sleep apnea are: increased Cardiovascular disease, including congestive heart failure, stroke, difficult to control hypertension, treatment resistant obesity, arrhythmias, especially irregular heartbeat commonly known as A. Fib. (atrial fibrillation); even type 2 diabetes has been linked to untreated OSA.   Sleep apnea can cause disruption of sleep and sleep deprivation in most cases, which, in turn, can cause recurrent headaches, problems with memory, mood, concentration, focus, and vigilance. Most people with untreated sleep apnea report excessive daytime sleepiness, which can affect their ability to drive. Please do not drive if you feel sleepy. Patients with sleep apnea can also develop difficulty initiating and maintaining sleep (aka insomnia).   Having sleep apnea may increase your risk for other sleep disorders, including involuntary behaviors sleep such as sleep terrors, sleep  talking, sleepwalking.    Having sleep apnea can also increase your risk for restless leg syndrome and leg movements at night.   Please note that untreated obstructive sleep apnea may carry additional perioperative morbidity. Patients with significant obstructive sleep apnea (typically, in the moderate to severe degree) should receive, if possible, perioperative PAP (positive airway pressure) therapy and the surgeons and particularly the anesthesiologists should be informed of the diagnosis and the severity of the sleep disordered breathing.   I will likely see you back after your sleep study to go over the test results and where to go from there. We will call you after your sleep study to advise about the results (most likely, you will hear from [facility], my nurse) and to set up an appointment at the time, as necessary.    Our sleep lab administrative assistant will call you to schedule your sleep study and give you further instructions, regarding the check in process for the sleep study, arrival time, what to bring, when you can expect to leave after the study, etc., and to answer any other logistical questions you may have. If you don't hear back from her by about 2 weeks from now, please feel free to call her direct line at [phone] or you can call our general clinic number, or email [place] through My Chart.

## 2021-04-24 ENCOUNTER — Other Ambulatory Visit: Payer: Self-pay | Admitting: Family Medicine

## 2021-04-27 NOTE — Telephone Encounter (Signed)
Pt "no-showed" last OV on 01/19/21, no future appts., last filled on 10/22/20 #90 tabs with 1 refill

## 2021-05-06 ENCOUNTER — Other Ambulatory Visit: Payer: Self-pay | Admitting: Family Medicine

## 2021-05-06 ENCOUNTER — Encounter: Payer: Self-pay | Admitting: Family Medicine

## 2021-05-06 ENCOUNTER — Ambulatory Visit: Payer: No Typology Code available for payment source | Admitting: Gastroenterology

## 2021-05-06 NOTE — Telephone Encounter (Signed)
Name of Medication: Zolpidem Name of Pharmacy: [facility] [address] Henrietta Dine or Written Date and Quantity: 11/19/20 #30 tabs with 3 refills Last Office Visit and Type: dizziness on 10/29/20 Next Office Visit and Type: none scheduled

## 2021-05-06 NOTE — Telephone Encounter (Signed)
See separate refill request

## 2021-05-12 ENCOUNTER — Ambulatory Visit: Payer: No Typology Code available for payment source | Admitting: Neurology

## 2021-05-20 ENCOUNTER — Telehealth: Payer: Self-pay | Admitting: Family Medicine

## 2021-05-20 ENCOUNTER — Encounter: Payer: Self-pay | Admitting: Family Medicine

## 2021-05-20 NOTE — Telephone Encounter (Signed)
I sent pt a message saying call the office to schedule an appt but if you all have time pt wants an appt with Dr. Lorelei Pont for foot issues. Please call and schedule appt when able. thanks

## 2021-05-20 NOTE — Telephone Encounter (Signed)
I tried calling pt to schedule apt with dr. Lorelei Pont but no answer lvm

## 2021-05-21 ENCOUNTER — Ambulatory Visit (INDEPENDENT_AMBULATORY_CARE_PROVIDER_SITE_OTHER)
Admission: RE | Admit: 2021-05-21 | Discharge: 2021-05-21 | Disposition: A | Discharge status: Home / Self Care | Payer: No Typology Code available for payment source | Source: Ambulatory Visit | Attending: Family Medicine | Admitting: Family Medicine

## 2021-05-21 ENCOUNTER — Ambulatory Visit: Payer: No Typology Code available for payment source | Admitting: Family Medicine

## 2021-05-21 ENCOUNTER — Other Ambulatory Visit: Payer: Self-pay

## 2021-05-21 ENCOUNTER — Encounter: Payer: Self-pay | Admitting: Family Medicine

## 2021-05-21 VITALS — BP 110/70 | HR 82 | Temp 97.9°F | Ht 64.0 in | Wt 234.4 lb

## 2021-05-21 DIAGNOSIS — M79672 Pain in left foot: Secondary | ICD-10-CM

## 2021-05-21 DIAGNOSIS — M7662 Achilles tendinitis, left leg: Secondary | ICD-10-CM

## 2021-05-21 DIAGNOSIS — Z8673 Personal history of transient ischemic attack (TIA), and cerebral infarction without residual deficits: Secondary | ICD-10-CM | POA: Diagnosis not present

## 2021-05-21 DIAGNOSIS — R269 Unspecified abnormalities of gait and mobility: Secondary | ICD-10-CM | POA: Diagnosis not present

## 2021-05-21 MED ORDER — NITROGLYCERIN 0.2 MG/HR TD PT24: MEDICATED_PATCH | TRANSDERMAL | 2 refills | Status: DC

## 2021-05-21 NOTE — Telephone Encounter (Signed)
Pt scheduled appt

## 2021-05-21 NOTE — Progress Notes (Addendum)
Carol Fyock T. Daimien Patmon, MD, [place] at [facility] [place], [address]  Phone: [phone]  FAX: [phone]  Carol Johnson - 55 y.o. female  MRN [id number]  Date of Birth: 1965/11/12  Date: 05/21/2021  PCP: Abner Greenspan, MD  Referral: Abner Greenspan, MD  Chief Complaint  Patient presents with   Foot Pain    Left Heel    This visit occurred during the SARS-CoV-2 public health emergency.  Safety protocols were in place, including screening questions prior to the visit, additional usage of staff PPE, and extensive cleaning of exam room while observing appropriate contact time as indicated for disinfecting solutions.   Subjective:   Pleasant patient who presents with a 4-5 week history of posterior heel pain.  No occult, abrupt onset. Has been more insidious in character. There is a dull ache present and worse with activity:  History is significant for prior CVA effecting L weakness and altered gait.  She is also having some knee pain and hip pain.  The patient presents with some left-sided foot pain.  I recall that well, and I have seen her previously for a few other issues.  Obvious achilles bump.  Prior home rehab: She has done a lot of therapy post stroke, but she has not done any recent therapy or anything specific for her heel pain Prior meds: OTC only Orthosis / Braces: none   Review of Systems is noted in the HPI, as appropriate  Objective:   Blood pressure 110/70, pulse 82, temperature 97.9 F (36.6 C), temperature source Temporal, height 5' 4"  (1.626 m), weight 234 lb 6 oz (106.3 kg), last menstrual period 11/29/2015, SpO2 97 %.  There is no tenderness along the tibia or fibula.  Foot: Left Echymosis: no Edema: no ROM: full LE B Gait: heel toe, non-antalgic MT pain: no Callus pattern: none Lateral Mall: NT Medial Mall: NT Talus: NT Navicular: NT Cuboid: NT Calcaneous: NT Metatarsals:  NT 5th MT: NT Phalanges: NT Achilles: PAINFUL TO PALPATE AT INSERTION ON LEFT, SMALL NODULE Plantar Fascia: NT Fat Pad: NT Peroneals: NT Post Tib: NT Great Toe: Nml motion Ant Drawer: neg ATFL: NT CFL: NT Deltoid: NT Sensation: intact  Radiology: No results found.  Assessment and Plan:     ICD-10-CM   1. Tendonitis, Achilles, left  M76.62 Ambulatory referral to Physical Therapy    2. Pain of left heel  M79.672 DG Os Calcis Left    Ambulatory referral to Physical Therapy    3. Abnormality of gait  R26.9 Ambulatory referral to Physical Therapy    4. History of CVA (cerebrovascular accident)  Z86.73 Ambulatory referral to Physical Therapy     Achilles with acute on chronic gait disturbance and weakness after stroke distantly.  Additionally, I have given the patient the program emphasizing eccentric overloading detailed in the instructions based on Dr. Trudi Ida work and protocols.  Supportive footwear reviewed.   I think that this a lot may be driven by her post stroke changes in strength and gait.  I am also can add physical therapy to work on this with her when she is they are working on her heel.  I suspect she may have intermittent musculoskeletal flareups with multiple lower extremity joints, and I hope that by retraining some balance and movement this will decrease this risk.  Addendum: 05/25/21 10:50 AM  After review of the patient's x-rays again, she does have what appears to be  a stress fracture at the superior calcaneal spur.  I did place the patient in the pneumatic cam walker boot, and she will follow-up as scheduled.  Patient Instructions  Achilles Tendon Rehab  Start easy.  It is ok if there is mild discomfort, but if there is pain, then back off how much rehab you are doing.  For achilles rehab, you basically just need to concentrate on the ankle going up and down.  Start: Calf raises while seated First lower and then raise on both feet Assist lifting  with hands and then slowly lower  Begin with 3 sets of 10 repetitions Increase by 5 repetitions every 3 days  Goal is 3 sets of 30 repetitions  If feels good at 3 sets of 30 - add backpack with 5 lbs  Increase by 5 lbs per week to max of 30 lbs   Alternative: If you have weights at home, you can put a dumbbell upright on the knee of the effected heel.  Go up with assistance from your hands, and then lower slowly.  If this is easy, then change to calf lowers standing on a step.  A heel cup of any brand will elevate the heel and take stress off of the achilles tendon. I particularly like the brand called Tuli's heel cups.  They cup the back of the heel, too.  They can be hard to find unless you can order off of the computer like on [place]. Any heel cup is ok, though, including ones you can find at any pharmacy    Nitroglycerin Protocol  Apply 1/4 nitroglycerin patch to affected area daily. Change position of patch within the affected area every 24 hours. You may experience a headache during the first 1-2 weeks of using the patch, these should subside. If you experience headaches after beginning nitroglycerin patch treatment, you may take your preferred over the counter pain reliever. Another side effect of the nitroglycerin patch is skin irritation or rash related to patch adhesive. Please notify our office if you develop more severe headaches or rash, and stop the patch. Tendon healing with nitroglycerin patch may require 12 to 24 weeks depending on the extent of injury. Men should not use if taking Viagra, Cialis, or Levitra.  Do not use if you have migraines or rosacea.     Follow-up: Return in about 6 weeks (around 07/02/2021).  Meds ordered this encounter  Medications   nitroGLYCERIN (NITRODUR - DOSED IN MG/24 HR) 0.2 mg/hr patch    Sig: Apply 1/4 patch to affected area as directed by MD and change every 24 hours.    Dispense:  30 patch    Refill:  2   There are no  discontinued medications. Orders Placed This Encounter  Procedures   DG Os Calcis Left   Ambulatory referral to Physical Therapy    Signed,  Rihaan Barrack T. Ellamae Lybeck, MD   Outpatient Encounter Medications as of 05/21/2021  Medication Sig   acetaminophen (TYLENOL) 325 MG tablet Take 2 tablets (650 mg total) by mouth every 4 (four) hours as needed for mild pain (or temp > 37.5 C (99.5 F)).   albuterol (VENTOLIN HFA) 108 (90 Base) MCG/ACT inhaler INHALE 2 PUFFS INTO THE LUNGS EVERY 4 (FOUR) HOURS AS NEEDED FOR WHEEZING, COUGH, OR SHORTNESS OF BREATH   atorvastatin (LIPITOR) 80 MG tablet TAKE 1 TABLET BY MOUTH DAILY AT 6 PM.   buPROPion (WELLBUTRIN XL) 300 MG 24 hr tablet Take 1 tablet (300 mg total) by mouth  daily.   citalopram (CELEXA) 20 MG tablet Take 1 tablet (20 mg total) by mouth daily.   DERMA-SMOOTHE/FS SCALP 0.01 % OIL Apply 1 a small amount to skin three times a week  Part hair, apply to scalp, cover with plastic cap, wash out in the morning   fexofenadine (ALLEGRA) 180 MG tablet Take 180 mg by mouth daily as needed.    fluticasone (FLONASE) 50 MCG/ACT nasal spray Place 2 sprays into both nostrils daily.   ketoconazole (NIZORAL) 2 % shampoo Apply 1 application topically 2 (two) times a week.   loperamide (IMODIUM) 2 MG capsule Take 1 capsule (2 mg total) by mouth as needed for diarrhea or loose stools. (Patient taking differently: Take 2 mg by mouth daily.)   mesalamine (LIALDA) 1.2 g EC tablet TAKE 4 TABLETS BY MOUTH DAILY WITH BREAKFAST.   methocarbamol (ROBAXIN) 500 MG tablet TAKE 2 TABLETS (1,000 MG TOTAL) BY MOUTH 2 (TWO) TIMES DAILY AS NEEDED FOR MUSCLE SPASMS.   nitroGLYCERIN (NITRODUR - DOSED IN MG/24 HR) 0.2 mg/hr patch Apply 1/4 patch to affected area as directed by MD and change every 24 hours.   pantoprazole (PROTONIX) 40 MG tablet Take 1 tablet (40 mg total) by mouth daily.   vitamin B-12 (CYANOCOBALAMIN) 100 MCG tablet Take 1 tablet (100 mcg total) by mouth daily.   XARELTO  20 MG TABS tablet TAKE 1 TABLET (20 MG TOTAL) BY MOUTH DAILY WITH SUPPER.   zolpidem (AMBIEN CR) 12.5 MG CR tablet TAKE 1 TABLET (12.5 MG TOTAL) BY MOUTH AT BEDTIME AS NEEDED. FOR SLEEP   No facility-administered encounter medications on file as of 05/21/2021.

## 2021-05-21 NOTE — Patient Instructions (Addendum)
Achilles Tendon Rehab  Start easy.  It is ok if there is mild discomfort, but if there is pain, then back off how much rehab you are doing.  For achilles rehab, you basically just need to concentrate on the ankle going up and down.  Start: Calf raises while seated First lower and then raise on both feet Assist lifting with hands and then slowly lower  Begin with 3 sets of 10 repetitions Increase by 5 repetitions every 3 days  Goal is 3 sets of 30 repetitions  If feels good at 3 sets of 30 - add backpack with 5 lbs  Increase by 5 lbs per week to max of 30 lbs   Alternative: If you have weights at home, you can put a dumbbell upright on the knee of the effected heel.  Go up with assistance from your hands, and then lower slowly.  If this is easy, then change to calf lowers standing on a step.  A heel cup of any brand will elevate the heel and take stress off of the achilles tendon. I particularly like the brand called Tuli's heel cups.  They cup the back of the heel, too.  They can be hard to find unless you can order off of the computer like on [place]. Any heel cup is ok, though, including ones you can find at any pharmacy    Nitroglycerin Protocol  Apply 1/4 nitroglycerin patch to affected area daily. Change position of patch within the affected area every 24 hours. You may experience a headache during the first 1-2 weeks of using the patch, these should subside. If you experience headaches after beginning nitroglycerin patch treatment, you may take your preferred over the counter pain reliever. Another side effect of the nitroglycerin patch is skin irritation or rash related to patch adhesive. Please notify our office if you develop more severe headaches or rash, and stop the patch. Tendon healing with nitroglycerin patch may require 12 to 24 weeks depending on the extent of injury. Men should not use if taking Viagra, Cialis, or Levitra.  Do not use if you have migraines or  rosacea.

## 2021-05-25 ENCOUNTER — Other Ambulatory Visit: Payer: Self-pay

## 2021-05-25 ENCOUNTER — Ambulatory Visit (INDEPENDENT_AMBULATORY_CARE_PROVIDER_SITE_OTHER): Payer: No Typology Code available for payment source | Admitting: Family Medicine

## 2021-05-25 ENCOUNTER — Encounter: Payer: Self-pay | Admitting: Family Medicine

## 2021-05-25 DIAGNOSIS — M84375A Stress fracture, left foot, initial encounter for fracture: Secondary | ICD-10-CM

## 2021-05-25 NOTE — Progress Notes (Signed)
No charge  Med CAM walker boot   addendum to initial visit. Mrs. Carol Johnson fit the patient with a cam walker boot, and I personally also ensured to fit.

## 2021-06-09 ENCOUNTER — Telehealth: Payer: Self-pay

## 2021-06-09 NOTE — Telephone Encounter (Signed)
Patient returned call and declined wanting a sleep study.

## 2021-06-09 NOTE — Telephone Encounter (Signed)
LVM for pt to call me back to schedule sleep study  

## 2021-06-16 ENCOUNTER — Other Ambulatory Visit: Payer: Self-pay

## 2021-06-16 ENCOUNTER — Encounter: Payer: Self-pay | Admitting: Family Medicine

## 2021-06-16 ENCOUNTER — Telehealth (INDEPENDENT_AMBULATORY_CARE_PROVIDER_SITE_OTHER): Payer: No Typology Code available for payment source | Admitting: Family Medicine

## 2021-06-16 DIAGNOSIS — R059 Cough, unspecified: Secondary | ICD-10-CM | POA: Diagnosis not present

## 2021-06-16 DIAGNOSIS — J069 Acute upper respiratory infection, unspecified: Secondary | ICD-10-CM

## 2021-06-16 MED ORDER — AZITHROMYCIN 250 MG PO TABS: ORAL_TABLET | ORAL | 0 refills | Status: DC

## 2021-06-16 MED ORDER — ALBUTEROL SULFATE HFA 108 (90 BASE) MCG/ACT IN AERS: INHALATION_SPRAY | RESPIRATORY_TRACT | 3 refills | Status: DC

## 2021-06-16 NOTE — Patient Instructions (Signed)
Take the zithromax for ear infection  Albuterol for wheezing , if worse we can px prednisone  Drink fluids and rest  mucinex DM is good for cough and congestion  Nasal saline for congestion as needed  Tylenol for fever or pain or headache  Please alert [place] if symptoms worsen (if severe or short of breath please go to the ER)   Update if not starting to improve in a week or if worsening   Stay out of the office until Monday (return if symptoms are better) Consider doing a covid test at home or pharmacy

## 2021-06-16 NOTE — Assessment & Plan Note (Signed)
Day 8 (has not done covid testing) Congestion and prod cough (scant wheeze) and significant worsening R ear pain  Px zithromax for likely OM  Albuterol refill Declines prednisone since wheeze is mild (will watch closely and update if worse) Rest/fluids mucinex Tylenol  Enc her to get a home covid test or go to pharmacy (too far to drive to come here) Will excuse from work on Thursday Will stay home until improved ER precautions reviewed Update if not starting to improve in a week or if worsening

## 2021-06-16 NOTE — Progress Notes (Signed)
Virtual Visit via Video Note  I connected with Carol Johnson on 06/16/21 at 10:30 AM EDT by a video enabled telemedicine application and verified that I am speaking with the correct person using two identifiers.  Location: Patient: home Provider: office   I discussed the limitations of evaluation and management by telemedicine and the availability of in person appointments. The patient expressed understanding and agreed to proceed.  Parties involved in encounter  Patient: [name]  Provider:  Loura Pardon MD   History of Present Illness: Pt presents for uri symptoms  H/o asthma and allergies and CAD/CVA  Symptoms started a week ago Monday  Started with itchy throat and ear and a headache  Now throat-is sore now Right ear -hurts worse and worse/especially at night  It pops/no drainage  Cough prod of yellow/green mucous Feels like it is in upper chest/throat  Voice comes and goes -worse on Friday  A little wheezing (upper chest and throat) , but not sob  Some nasal symptoms worse in the am   No n/v/d   Has not done a covid test  Possible fever last week/some chills   Recently had a stress fracture of L calcaneus (spur) Sees Dr Lorelei Pont   Covid immunized   Otc; Mucinex cold and flu  Tylenol    Out of work today Next day is Thursday   Patient Active Problem List   Diagnosis Date Noted  . Cerebrovascular accident ([place]) 01/12/2021  . Chronic neck pain 01/12/2021  . Gait abnormality 01/12/2021  . Snores 01/12/2021  . Vertigo 10/29/2020  . Hearing loss 10/29/2020  . Viral URI with cough 09/19/2020  . Hypercoagulable state ([place]) 12/18/2019  . Hair loss 12/03/2019  . Aneurysm of anterior cerebral artery 12/03/2019  . Abnormality of gait 09/04/2019  . Hemiparesis affecting nondominant side as late effect of stroke ([place]) 07/24/2019  . Muscle weakness (generalized) 06/11/2019  . Hemiplegia and hemiparesis following cerebral infarction affecting left  non-dominant side ([place]) 06/11/2019  . Major depressive disorder   . Hypokalemia   . Acute blood loss anemia   . Ulcerative colitis with complication ([place])   . H/O: CVA (cerebrovascular accident) 05/23/2019  . Thrombocytosis 05/23/2019  . Coronary artery disease involving native coronary artery of native heart without angina pectoris 03/21/2018  . Coronary artery calcification 02/10/2018  . Obesity (BMI 30-39.9) 02/10/2018  . Former smoker 01/15/2018  . Dyspnea on exertion 12/21/2017  . Carpal tunnel syndrome 12/21/2017  . Stress incontinence 12/21/2017  . Paresthesia of both feet 07/04/2017  . Always thirsty 07/04/2017  . Abdominal bloating 12/01/2015  . Fatigue 12/01/2015  . Vitamin B12 deficiency 12/01/2015  . Lymphocytic colitis 01/16/2015  . Anemia, iron deficiency 01/16/2015  . Anxiety state 12/20/2014  . Diarrhea 11/27/2014  . Insomnia 11/27/2014  . History of DVT (deep vein thrombosis) 09/17/2014  . OSA (obstructive sleep apnea) 09/02/2014  . Gastritis, chronic 06/06/2014  . Unspecified gastritis and gastroduodenitis without mention of hemorrhage 05/03/2014  . Abdominal pain, epigastric 04/19/2014  . Internal hemorrhoids with other complication 41/96/2229  . Routine general medical examination at a health care facility 10/14/2011  . Thyroid nodule 05/16/2011  . Rectal bleeding 11/24/2010  . Hemorrhoids, internal 11/24/2010  . Irritable bowel syndrome (IBS) 11/24/2010  . ANEMIA 10/02/2009  . Hyperlipidemia 07/18/2009  . Essential hypertension 07/18/2009  . DYSPNEA ON EXERTION 07/18/2009  . THYROID CYST 01/31/2008  . ANXIETY 01/31/2008  . Depression with anxiety 01/31/2008  . GASTRIC ULCER, HX OF 01/31/2008  .  FIBROIDS, UTERUS 11/13/2007  . Allergic rhinitis 11/13/2007  . Asthma 11/13/2007  . GERD 11/13/2007  . IRRITABLE BOWEL SYNDROME 11/13/2007  . INTERNAL HEMORRHOIDS 11/12/2003   Past Medical History:  Diagnosis Date  . Abdominal pain, unspecified site   .  Allergic rhinitis   . Anxiety   . Asthma   . Depression   . DVT (deep venous thrombosis) ([place]) 2016  . DVT of deep femoral vein, left ([place])   . Fibroid uterus   . GERD (gastroesophageal reflux disease)   . Hypercholesterolemia   . Hypertension   . Internal hemorrhoid   . Irritable bowel syndrome   . Lymphocytic colitis 01/02/2015  . Pneumonia 1998  . Stroke [facility]) 2020   left sided weekness  . Ulcerative colitis ([place])   . URI (upper respiratory infection)   . Uterine fibroid   . Vitamin B12 deficiency    Past Surgical History:  Procedure Laterality Date  . CESAREAN SECTION  08/30/2002  . CHOLECYSTECTOMY     laparoscopic "gallstones"  . ESOPHAGOGASTRODUODENOSCOPY (EGD) WITH PROPOFOL N/A 05/03/2014   Procedure: ESOPHAGOGASTRODUODENOSCOPY (EGD) WITH PROPOFOL;  Surgeon: Inda Castle, MD;  Location: [facility] ENDOSCOPY;  Service: Endoscopy;  Laterality: N/A;  . UTERINE FIBROID SURGERY     Social History   Tobacco Use  . Smoking status: Former    Packs/day: 0.10    Years: 10.00    Pack years: 1.00    Types: Cigarettes    Quit date: 12/07/2017    Years since quitting: 3.5  . Smokeless tobacco: Never  Vaping Use  . Vaping Use: Never used  Substance Use Topics  . Alcohol use: Yes    Alcohol/week: 0.0 standard drinks    Comment: rarely  . Drug use: No   Family History  Problem Relation Age of Onset  . Hyperlipidemia Mother   . Mitral valve prolapse Mother   . Heart disease Mother        arrhythmia  . Pulmonary fibrosis Father   . Colon cancer Paternal Aunt   . Heart disease Paternal Aunt   . Heart disease Paternal Uncle   . Heart failure Maternal Grandmother   . Uterine cancer Paternal Grandmother        with mets to the colon   . Kidney disease Cousin   . Esophageal cancer Neg Hx   . Stomach cancer Neg Hx   . Rectal cancer Neg Hx    Allergies  Allergen Reactions  . Zoloft [Sertraline Hcl] Diarrhea   Current Outpatient Medications on File Prior to Visit   Medication Sig Dispense Refill  . acetaminophen (TYLENOL) 325 MG tablet Take 2 tablets (650 mg total) by mouth every 4 (four) hours as needed for mild pain (or temp > 37.5 C (99.5 F)).    Marland Kitchen atorvastatin (LIPITOR) 80 MG tablet TAKE 1 TABLET BY MOUTH DAILY AT 6 PM. 90 tablet 1  . buPROPion (WELLBUTRIN XL) 300 MG 24 hr tablet Take 1 tablet (300 mg total) by mouth daily. 90 tablet 3  . citalopram (CELEXA) 20 MG tablet Take 1 tablet (20 mg total) by mouth daily. 90 tablet 3  . DERMA-SMOOTHE/FS SCALP 0.01 % OIL Apply 1 a small amount to skin three times a week  Part hair, apply to scalp, cover with plastic cap, wash out in the morning    . fexofenadine (ALLEGRA) 180 MG tablet Take 180 mg by mouth daily as needed.     . fluticasone (FLONASE) 50 MCG/ACT nasal spray Place 2  sprays into both nostrils daily. 16 g 6  . ketoconazole (NIZORAL) 2 % shampoo Apply 1 application topically 2 (two) times a week.    . loperamide (IMODIUM) 2 MG capsule Take 1 capsule (2 mg total) by mouth as needed for diarrhea or loose stools. (Patient taking differently: Take 2 mg by mouth daily.) 30 capsule 0  . mesalamine (LIALDA) 1.2 g EC tablet TAKE 4 TABLETS BY MOUTH DAILY WITH BREAKFAST. 360 tablet 2  . nitroGLYCERIN (NITRODUR - DOSED IN MG/24 HR) 0.2 mg/hr patch Apply 1/4 patch to affected area as directed by MD and change every 24 hours. 30 patch 2  . pantoprazole (PROTONIX) 40 MG tablet Take 1 tablet (40 mg total) by mouth daily. 90 tablet 3  . vitamin B-12 (CYANOCOBALAMIN) 100 MCG tablet Take 1 tablet (100 mcg total) by mouth daily. 1 tablet   . XARELTO 20 MG TABS tablet TAKE 1 TABLET (20 MG TOTAL) BY MOUTH DAILY WITH SUPPER. 90 tablet 3  . zolpidem (AMBIEN CR) 12.5 MG CR tablet TAKE 1 TABLET (12.5 MG TOTAL) BY MOUTH AT BEDTIME AS NEEDED. FOR SLEEP 30 tablet 3   No current facility-administered medications on file prior to visit.   Review of Systems  Constitutional:  Positive for malaise/fatigue. Negative for chills and  fever.  HENT:  Positive for congestion, ear pain and sore throat. Negative for ear discharge and sinus pain.   Eyes:  Negative for blurred vision, discharge and redness.  Respiratory:  Positive for cough, sputum production and wheezing. Negative for shortness of breath and stridor.   Cardiovascular:  Negative for chest pain, palpitations and leg swelling.  Gastrointestinal:  Negative for abdominal pain, diarrhea, nausea and vomiting.  Musculoskeletal:  Negative for myalgias.  Skin:  Negative for rash.  Neurological:  Positive for headaches. Negative for dizziness.   Observations/Objective: Patient appears well, in no distress Weight is baseline  No facial swelling or asymmetry Voice is mildly hoarse No obvious tremor or mobility impairment Moving neck and UEs normally Able to hear the call well  No wheeze or shortness of breath during interview  Occ wet cough noted along with throat clearing  Talkative and mentally sharp with no cognitive changes No skin changes on face or neck , no rash or pallor Affect is normal    Assessment and Plan: Problem List Items Addressed This Visit       Respiratory   Viral URI with cough - Primary    Day 8 (has not done covid testing) Congestion and prod cough (scant wheeze) and significant worsening R ear pain  Px zithromax for likely OM  Albuterol refill Declines prednisone since wheeze is mild (will watch closely and update if worse) Rest/fluids mucinex Tylenol  Enc her to get a home covid test or go to pharmacy (too far to drive to come here) Will excuse from work on Thursday Will stay home until improved ER precautions reviewed Update if not starting to improve in a week or if worsening        Relevant Medications   azithromycin (ZITHROMAX Z-PAK) 250 MG tablet   Other Visit Diagnoses     Cough       Relevant Medications   albuterol (VENTOLIN HFA) 108 (90 Base) MCG/ACT inhaler        Follow Up Instructions: Take the  zithromax for ear infection  Albuterol for wheezing , if worse we can px prednisone  Drink fluids and rest  mucinex DM is good for cough and  congestion  Nasal saline for congestion as needed  Tylenol for fever or pain or headache  Please alert [place] if symptoms worsen (if severe or short of breath please go to the ER)   Update if not starting to improve in a week or if worsening   Stay out of the office until Monday (return if symptoms are better) Consider doing a covid test at home or pharmacy   I discussed the assessment and treatment plan with the patient. The patient was provided an opportunity to ask questions and all were answered. The patient agreed with the plan and demonstrated an understanding of the instructions.   The patient was advised to call back or seek an in-person evaluation if the symptoms worsen or if the condition fails to improve as anticipated.     Loura Pardon, MD

## 2021-07-01 ENCOUNTER — Ambulatory Visit: Payer: No Typology Code available for payment source | Admitting: Family Medicine

## 2021-07-02 ENCOUNTER — Other Ambulatory Visit: Payer: Self-pay

## 2021-07-02 ENCOUNTER — Ambulatory Visit: Payer: No Typology Code available for payment source | Admitting: Gastroenterology

## 2021-07-02 ENCOUNTER — Encounter: Payer: Self-pay | Admitting: Gastroenterology

## 2021-07-02 ENCOUNTER — Other Ambulatory Visit (INDEPENDENT_AMBULATORY_CARE_PROVIDER_SITE_OTHER): Payer: No Typology Code available for payment source

## 2021-07-02 ENCOUNTER — Ambulatory Visit (INDEPENDENT_AMBULATORY_CARE_PROVIDER_SITE_OTHER)
Admission: RE | Admit: 2021-07-02 | Discharge: 2021-07-02 | Disposition: A | Discharge status: Home / Self Care | Payer: No Typology Code available for payment source | Source: Ambulatory Visit | Attending: Family Medicine | Admitting: Family Medicine

## 2021-07-02 ENCOUNTER — Encounter: Payer: Self-pay | Admitting: Family Medicine

## 2021-07-02 ENCOUNTER — Ambulatory Visit: Payer: No Typology Code available for payment source | Admitting: Family Medicine

## 2021-07-02 VITALS — BP 130/90 | HR 75 | Temp 97.8°F | Ht 64.0 in | Wt 236.0 lb

## 2021-07-02 VITALS — BP 152/84 | HR 76 | Ht 62.0 in | Wt 235.0 lb

## 2021-07-02 DIAGNOSIS — M84375A Stress fracture, left foot, initial encounter for fracture: Secondary | ICD-10-CM

## 2021-07-02 DIAGNOSIS — M7662 Achilles tendinitis, left leg: Secondary | ICD-10-CM

## 2021-07-02 DIAGNOSIS — K219 Gastro-esophageal reflux disease without esophagitis: Secondary | ICD-10-CM

## 2021-07-02 DIAGNOSIS — R197 Diarrhea, unspecified: Secondary | ICD-10-CM

## 2021-07-02 DIAGNOSIS — K52832 Lymphocytic colitis: Secondary | ICD-10-CM | POA: Diagnosis not present

## 2021-07-02 LAB — CBC WITH DIFFERENTIAL/PLATELET
Basophils Absolute: 0 10*3/uL (ref 0.0–0.1)
Basophils Relative: 0.2 % (ref 0.0–3.0)
Eosinophils Absolute: 0.6 10*3/uL (ref 0.0–0.7)
Eosinophils Relative: 7.7 % — ABNORMAL HIGH (ref 0.0–5.0)
HCT: 35.7 % — ABNORMAL LOW (ref 36.0–46.0)
Hemoglobin: 11.4 g/dL — ABNORMAL LOW (ref 12.0–15.0)
Lymphocytes Relative: 23.1 % (ref 12.0–46.0)
Lymphs Abs: 1.8 10*3/uL (ref 0.7–4.0)
MCHC: 31.9 g/dL (ref 30.0–36.0)
MCV: 77.1 fl — ABNORMAL LOW (ref 78.0–100.0)
Monocytes Absolute: 0.6 10*3/uL (ref 0.1–1.0)
Monocytes Relative: 7.5 % (ref 3.0–12.0)
Neutro Abs: 4.8 10*3/uL (ref 1.4–7.7)
Neutrophils Relative %: 61.5 % (ref 43.0–77.0)
Platelets: 381 10*3/uL (ref 150.0–400.0)
RBC: 4.64 Mil/uL (ref 3.87–5.11)
RDW: 16.2 % — ABNORMAL HIGH (ref 11.5–15.5)
WBC: 7.7 10*3/uL (ref 4.0–10.5)

## 2021-07-02 LAB — COMPREHENSIVE METABOLIC PANEL
ALT: 18 U/L (ref 0–35)
AST: 12 U/L (ref 0–37)
Albumin: 3.9 g/dL (ref 3.5–5.2)
Alkaline Phosphatase: 89 U/L (ref 39–117)
BUN: 11 mg/dL (ref 6–23)
CO2: 28 mEq/L (ref 19–32)
Calcium: 9.3 mg/dL (ref 8.4–10.5)
Chloride: 107 mEq/L (ref 96–112)
Creatinine, Ser: 0.7 mg/dL (ref 0.40–1.20)
GFR: 96.47 mL/min (ref >60.00)
Glucose, Bld: 128 mg/dL — ABNORMAL HIGH (ref 70–99)
Potassium: 3.5 mEq/L (ref 3.5–5.1)
Sodium: 143 mEq/L (ref 135–145)
Total Bilirubin: 0.4 mg/dL (ref 0.2–1.2)
Total Protein: 6.9 g/dL (ref 6.0–8.3)

## 2021-07-02 NOTE — Progress Notes (Signed)
Carol Johnson    [id number]    July 18, 1966  Primary Care Physician:Tower, Wynelle Fanny, MD  Referring Physician: Tower, Wynelle Fanny, MD [place],  [place] [address]   Chief complaint:  Lymphocytic colitis  HPI:  55 year old very pleasant female with history of anxiety, depression, hypertension, s/p CVA September 2020, DVT on chronic anticoagulation with Xarelto here for follow-up visit for chronic diarrhea and lymphocytic colitis   She is taking daily 4.8 g Lialda and 1 Imodium, continues to have 4 semiformed bowel movements per day associated with fecal urgency and occasional watery diarrhea.  Denies any nocturnal episodes of diarrhea.  She developed stress fracture in L ankle and is wearing a stabilizing boot  She felt Colestid made her symptoms worse.  She does not want to take budesonide or any form of steroid.   Colonoscopy 02/18/21 - One less than 1 mm polyp in the ascending colon, removed with a cold biopsy forceps. Resected and retrieved. - Normal mucosa in the entire examined colon. Biopsied. - Non-bleeding external and internal hemorrhoids.  1. Surgical [P], colon, ascending, polyp (1) - DIMINUTIVE TUBULAR ADENOMA - NEGATIVE FOR HIGH-GRADE DYSPLASIA OR MALIGNANCY 2. Surgical [P], random right colon sites - LYMPHOCYTIC COLITIS. SEE NOTE 3. Surgical [P], random left colon sites - LYMPHOCYTIC COLITIS. SEE NOTE    Lymphocytic colitis diagnosed in 2016.  She was treated with budesonide for a year by Dr. Deatra Ina, was tapered off in 2017.  Dr. Deatra Ina also started her on Lialda at the time for lymphocytic colitis.  She has since tapered off mesalamine and is currently taking Colestid 1 g twice daily.   No family history of colon cancer or IBD       Colonoscopy 01/02/2015: 1. Internal hemorrhoids 2. The examination was otherwise normal Surgical [P], random sites - SURFACE INTRAEPITHELIAL LYMPHOCYTOSIS, CONSISTENT WITH LYMPHOCYTIC COLITIS. - NO  EVIDENCE OF INFLAMMATORY BOWEL DISEASE. - NEGATIVE FOR DYSPLASIA OR MALIGNANCY.   EGD 05/03/2014: Chronic gastritis REACTIVE GASTROPATHY WITH MILD CHRONIC INFLAMMATION. NO HELICOBACTER PYLORI, DYSPLASIA OR MALIGNANCY    Outpatient Encounter Medications as of 07/02/2021  Medication Sig   acetaminophen (TYLENOL) 325 MG tablet Take 2 tablets (650 mg total) by mouth every 4 (four) hours as needed for mild pain (or temp > 37.5 C (99.5 F)).   albuterol (VENTOLIN HFA) 108 (90 Base) MCG/ACT inhaler INHALE 2 PUFFS INTO THE LUNGS EVERY 4 (FOUR) HOURS AS NEEDED FOR WHEEZING, COUGH, OR SHORTNESS OF BREATH   atorvastatin (LIPITOR) 80 MG tablet TAKE 1 TABLET BY MOUTH DAILY AT 6 PM.   azithromycin (ZITHROMAX Z-PAK) 250 MG tablet Take 2 pills by mouth today and then 1 pill daily for 4 days   buPROPion (WELLBUTRIN XL) 300 MG 24 hr tablet Take 1 tablet (300 mg total) by mouth daily.   citalopram (CELEXA) 20 MG tablet Take 1 tablet (20 mg total) by mouth daily.   DERMA-SMOOTHE/FS SCALP 0.01 % OIL Apply 1 a small amount to skin three times a week  Part hair, apply to scalp, cover with plastic cap, wash out in the morning   fexofenadine (ALLEGRA) 180 MG tablet Take 180 mg by mouth daily as needed.    fluticasone (FLONASE) 50 MCG/ACT nasal spray Place 2 sprays into both nostrils daily.   ketoconazole (NIZORAL) 2 % shampoo Apply 1 application topically 2 (two) times a week.   loperamide (IMODIUM) 2 MG capsule Take 1 capsule (2 mg total)  by mouth as needed for diarrhea or loose stools. (Patient taking differently: Take 2 mg by mouth daily.)   mesalamine (LIALDA) 1.2 g EC tablet TAKE 4 TABLETS BY MOUTH DAILY WITH BREAKFAST.   nitroGLYCERIN (NITRODUR - DOSED IN MG/24 HR) 0.2 mg/hr patch Apply 1/4 patch to affected area as directed by MD and change every 24 hours.   pantoprazole (PROTONIX) 40 MG tablet Take 1 tablet (40 mg total) by mouth daily.   vitamin B-12 (CYANOCOBALAMIN) 100 MCG tablet Take 1 tablet (100 mcg  total) by mouth daily.   XARELTO 20 MG TABS tablet TAKE 1 TABLET (20 MG TOTAL) BY MOUTH DAILY WITH SUPPER.   zolpidem (AMBIEN CR) 12.5 MG CR tablet TAKE 1 TABLET (12.5 MG TOTAL) BY MOUTH AT BEDTIME AS NEEDED. FOR SLEEP   No facility-administered encounter medications on file as of 07/02/2021.    Allergies as of 07/02/2021 - Review Complete 07/02/2021  Allergen Reaction Noted   Zoloft [sertraline hcl] Diarrhea 04/19/2014    Past Medical History:  Diagnosis Date   Abdominal pain, unspecified site    Allergic rhinitis    Anxiety    Asthma    Depression    DVT (deep venous thrombosis) ([facility]) 2016   DVT of deep femoral vein, left ([facility]CC)    Fibroid uterus    GERD (gastroesophageal reflux disease)    Hypercholesterolemia    Hypertension    Internal hemorrhoid    Irritable bowel syndrome    Lymphocytic colitis 01/02/2015   Pneumonia 1998   Stroke ([facility]) 2020   left sided weekness   Ulcerative colitis ([facility])    URI (upper respiratory infection)    Uterine fibroid    Vitamin B12 deficiency     Past Surgical History:  Procedure Laterality Date   CESAREAN SECTION  08/30/2002   CHOLECYSTECTOMY     laparoscopic "gallstones"   ESOPHAGOGASTRODUODENOSCOPY (EGD) WITH PROPOFOL N/A 05/03/2014   Procedure: ESOPHAGOGASTRODUODENOSCOPY (EGD) WITH PROPOFOL;  Surgeon: Inda Castle, MD;  Location: [facility];  Service: Endoscopy;  Laterality: N/A;   UTERINE FIBROID SURGERY      Family History  Problem Relation Age of Onset   Hyperlipidemia Mother    Mitral valve prolapse Mother    Heart disease Mother        arrhythmia   Pulmonary fibrosis Father    Colon cancer Paternal Aunt    Heart disease Paternal Aunt    Heart disease Paternal Uncle    Heart failure Maternal Grandmother    Uterine cancer Paternal Grandmother        with mets to the colon    Kidney disease Cousin    Esophageal cancer Neg Hx    Stomach cancer Neg Hx    Rectal cancer Neg Hx     Social History    Socioeconomic History   Marital status: Legally Separated    Spouse name: Not on file   Number of children: 1   Years of education: two year college   Highest education level: Associate degree: academic program  Occupational History   Occupation: Admin. assistant  Tobacco Use   Smoking status: Former    Packs/day: 0.10    Years: 10.00    Pack years: 1.00    Types: Cigarettes    Quit date: 12/07/2017    Years since quitting: 3.5   Smokeless tobacco: Never  Vaping Use   Vaping Use: Never used  Substance and Sexual Activity   Alcohol use: Yes    Alcohol/week: 0.0 standard  drinks    Comment: rarely   Drug use: No   Sexual activity: Yes    Birth control/protection: Pill  Other Topics Concern   Not on file  Social History Narrative   Lives alone. Son is there part of the time.   Right-handed.   Caffeine use: estimates seven cans of soda per day (12 ounces each).   Social Determinants of Health   Financial Resource Strain: Not on file  Food Insecurity: Not on file  Transportation Needs: Not on file  Physical Activity: Not on file  Stress: Not on file  Social Connections: Not on file  Intimate Partner Violence: Not on file      Review of systems: All other review of systems negative except as mentioned in the HPI.   Physical Exam: Vitals:   07/02/21 0907  BP: (!) 152/84  Pulse: 76   Body mass index is 42.98 kg/m. Gen:      No acute distress, left foot in the boot HEENT:  sclera anicteric Abd:      soft, non-tender; no palpable masses, no distension Ext:    No edema Neuro: alert and oriented x 3 Psych: normal mood and affect  Data Reviewed:  Reviewed labs, radiology imaging, old records and pertinent past GI work up   Assessment and Plan/Recommendations:  55 year old very pleasant female with history of DVT, CVA in 2020 on chronic anticoagulation with Xarelto  Lymphocytic colitis and chronic diarrhea  Patient is reluctant to use any form of  steroids including budesonide  Continue mesalamine/Lialda 4.8 g daily ok to use Imodium as needed for symptom relief  We will start low-dose immunosuppressive therapy, Imuran 25 mg daily to improve colitis given she has persistent symptoms of diarrhea Check TPMT enzyme activity, CBC and CMP We will repeat CBC and CMP in 4 weeks  If she is tolerating Imuran well with no side effects, will plan to increase it to 50 mg daily at next follow-up visit  GERD: Continue pantoprazole and antireflux measures  Return in 2 months  The patient was provided an opportunity to ask questions and all were answered. The patient agreed with the plan and demonstrated an understanding of the instructions.  Damaris Hippo , MD    CC: Tower, Wynelle Fanny, MD

## 2021-07-02 NOTE — Patient Instructions (Addendum)
Your provider has requested that you go to the basement level for lab work before leaving today. Press "B" on the elevator. The lab is located at the first door on the left as you exit the elevator.   We will send Imuran to your pharmacy  Continue Mesalamine   Follow up labs again in 4 weeks  (CBC,CMET)  If you are age 56 or older, your body mass index should be between 23-30. Your Body mass index is 42.98 kg/m. If this is out of the aforementioned range listed, please consider follow up with your Primary Care Provider.  If you are age 11 or younger, your body mass index should be between 19-25. Your Body mass index is 42.98 kg/m. If this is out of the aformentioned range listed, please consider follow up with your Primary Care Provider.   ________________________________________________________  The [facility] providers would like to encourage you to use [facility] to communicate with providers for non-urgent requests or questions.  Due to long hold times on the telephone, sending your provider a message by [facility] may be a faster and more efficient way to get a response.  Please allow 48 business hours for a response.  Please remember that this is for non-urgent requests.  _______________________________________________________   Due to recent changes in healthcare laws, you may see the results of your imaging and laboratory studies on MyChart before your provider has had a chance to review them.  We understand that in some cases there may be results that are confusing or concerning to you. Not all laboratory results come back in the same time frame and the provider may be waiting for multiple results in order to interpret others.  Please give [place] 48 hours in order for your provider to thoroughly review all the results before contacting the office for clarification of your results.    I appreciate the  opportunity to care for you  Thank You   Harl Bowie , MD

## 2021-07-02 NOTE — Progress Notes (Signed)
Carol Mckesson T. Camil Hausmann, MD, [place] [facility] [place], [address]  Phone: [phone]  FAX: [phone]  LEEYA Johnson - 55 y.o. female  MRN [id number]  Date of Birth: [birth year]  Date: 07/02/2021  PCP: Abner Greenspan, MD  Referral: Abner Greenspan, MD  Chief Complaint  Patient presents with  . Follow-up    Stress Fx Left Calcaneus    This visit occurred during the SARS-CoV-2 public health emergency.  Safety protocols were in place, including screening questions prior to the visit, additional usage of staff PPE, and extensive cleaning of exam room while observing appropriate contact time as indicated for disinfecting solutions.   Subjective:   Carol Johnson is a 55 y.o. very pleasant female patient with Body mass index is 40.51 kg/m. who presents with the following:  F/u stress fx at the achilles insertion.  She has a well-known patient, and I saw her most recently with some heel pain.  On her calcaneus x-ray, it does look like she had a stress fracture.  This does coincide with her altered gait after stroke.  She also has some tenderness in the Achilles tendon itself, consistent with Achilles tendinitis and some calcific tendinitis.  Still wearing her boot almost all the time.  She will feel okay for some amount of time, but walking and weightbearing in the foot without immobilization does hurt.  There is no bruising or swelling.  05/23/2021 xr   Review of Systems is noted in the HPI, as appropriate   Objective:   BP 130/90   Pulse 75   Temp 97.8 F (36.6 C) (Temporal)   Ht 5' 4"  (1.626 m)   Wt 236 lb (107 kg) Comment: with cam walker  LMP 11/29/2015   SpO2 98%   BMI 40.51 kg/m   She does walk with a limp.  Noticeably at the beginning this was not all that bad, but after x-ray this was more pronounced.  She has nontender throughout the entirety of the forefoot, digits, and midfoot.  She is nontender  at the ankle including the talus.  Nontender at the tibia or fibula including the malleoli.  Ligamentous structures are intact.  Her calcaneal squeeze is negative.  She does have focal tenderness at a bump at the distal Achilles tendon and near its insertion.  Radiology: DG Os Calcis Left  Result Date: 07/03/2021 CLINICAL DATA:  Follow-up heel enthesopathy LEFT foot EXAM: LEFT OS CALCIS - 2+ VIEW COMPARISON:  05/21/2021 FINDINGS: Osseous mineralization normal. Joint spaces preserved. Plantar calcaneal spur formation. Bulky spurs/enthesopathy at Achilles insertion. Again identified ill-defined lucency at the medial aspect of the Achilles insertion spur/enthesopathy, consistent with fracture. IMPRESSION: Again identified subacute fracture through spurring/enthesopathy at Achilles insertion. Additional plantar calcaneal spur LEFT foot. Electronically Signed   By: Lavonia Dana M.D.   On: 07/03/2021 09:46     Assessment and Plan:     ICD-10-CM   1. Stress fracture of left calcaneus  M84.375A DG Os Calcis Left    2. Tendonitis, Achilles, left  M76.62 DG Os Calcis Left     On my review, I do think there is been a modest amount of healing compared to prior x-ray.  Nevertheless her pain is still significant.  I wonder how much for Achilles tendinitis is playing a role here, 2.  She is going to wear her cam walker boot for another 6 weeks and then follow-up with me.  Orders Placed This Encounter  Procedures  . DG Os Calcis Left    Follow-up: Return for 5 1/2 weeks  recheck foot/heel.  Dragon [employer] One speech-to-text software was used for transcription in this dictation.  Possible transcriptional errors can occur using Editor, commissioning.   Signed,  Maud Deed. Anjolaoluwa Siguenza, MD   Outpatient Encounter Medications as of 07/02/2021  Medication Sig  . acetaminophen (TYLENOL) 325 MG tablet Take 2 tablets (650 mg total) by mouth every 4 (four) hours as needed for mild pain (or temp > 37.5 C (99.5  F)).  Marland Kitchen albuterol (VENTOLIN HFA) 108 (90 Base) MCG/ACT inhaler INHALE 2 PUFFS INTO THE LUNGS EVERY 4 (FOUR) HOURS AS NEEDED FOR WHEEZING, COUGH, OR SHORTNESS OF BREATH  . atorvastatin (LIPITOR) 80 MG tablet TAKE 1 TABLET BY MOUTH DAILY AT 6 PM.  . buPROPion (WELLBUTRIN XL) 300 MG 24 hr tablet Take 1 tablet (300 mg total) by mouth daily.  . citalopram (CELEXA) 20 MG tablet Take 1 tablet (20 mg total) by mouth daily.  . DERMA-SMOOTHE/FS SCALP 0.01 % OIL Apply 1 a small amount to skin three times a week  Part hair, apply to scalp, cover with plastic cap, wash out in the morning  . fexofenadine (ALLEGRA) 180 MG tablet Take 180 mg by mouth daily as needed.   . fluticasone (FLONASE) 50 MCG/ACT nasal spray Place 2 sprays into both nostrils daily.  Marland Kitchen ketoconazole (NIZORAL) 2 % shampoo Apply 1 application topically 2 (two) times a week.  . loperamide (IMODIUM) 2 MG capsule Take 1 capsule (2 mg total) by mouth as needed for diarrhea or loose stools.  . mesalamine (LIALDA) 1.2 g EC tablet TAKE 4 TABLETS BY MOUTH DAILY WITH BREAKFAST.  . pantoprazole (PROTONIX) 40 MG tablet Take 1 tablet (40 mg total) by mouth daily.  . vitamin B-12 (CYANOCOBALAMIN) 100 MCG tablet Take 1 tablet (100 mcg total) by mouth daily.  Alveda Reasons 20 MG TABS tablet TAKE 1 TABLET (20 MG TOTAL) BY MOUTH DAILY WITH SUPPER.  Marland Kitchen zolpidem (AMBIEN CR) 12.5 MG CR tablet TAKE 1 TABLET (12.5 MG TOTAL) BY MOUTH AT BEDTIME AS NEEDED. FOR SLEEP  . nitroGLYCERIN (NITRODUR - DOSED IN MG/24 HR) 0.2 mg/hr patch Apply 1/4 patch to affected area as directed by MD and change every 24 hours. (Patient not taking: Reported on 07/02/2021)  . [DISCONTINUED] azithromycin (ZITHROMAX Z-PAK) 250 MG tablet Take 2 pills by mouth today and then 1 pill daily for 4 days   No facility-administered encounter medications on file as of 07/02/2021.

## 2021-07-07 ENCOUNTER — Other Ambulatory Visit: Payer: Self-pay | Admitting: Physical Medicine & Rehabilitation

## 2021-07-07 ENCOUNTER — Other Ambulatory Visit: Payer: Self-pay | Admitting: Gastroenterology

## 2021-07-10 LAB — THIOPURINE METHYLTRANSFERASE (TPMT), RBC: Thiopurine Methyltransferase, RBC: 10 nmol/hr/mL RBC — ABNORMAL LOW

## 2021-07-10 LAB — QUANTIFERON-TB GOLD PLUS
Mitogen-NIL: 9.96 IU/mL
NIL: 0.02 IU/mL
QuantiFERON-TB Gold Plus: NEGATIVE
TB1-NIL: 0.01 IU/mL
TB2-NIL: 0.01 IU/mL

## 2021-07-13 ENCOUNTER — Other Ambulatory Visit: Payer: Self-pay | Admitting: Family Medicine

## 2021-07-29 ENCOUNTER — Encounter: Payer: Self-pay | Admitting: Family Medicine

## 2021-08-03 ENCOUNTER — Other Ambulatory Visit: Payer: Self-pay | Admitting: Family Medicine

## 2021-08-03 ENCOUNTER — Other Ambulatory Visit: Payer: Self-pay

## 2021-08-03 ENCOUNTER — Encounter: Payer: Self-pay | Admitting: Family Medicine

## 2021-08-03 ENCOUNTER — Telehealth: Payer: Self-pay

## 2021-08-03 ENCOUNTER — Telehealth (INDEPENDENT_AMBULATORY_CARE_PROVIDER_SITE_OTHER): Payer: No Typology Code available for payment source | Admitting: Family Medicine

## 2021-08-03 VITALS — HR 79 | Ht 64.0 in | Wt 229.0 lb

## 2021-08-03 DIAGNOSIS — K51919 Ulcerative colitis, unspecified with unspecified complications: Secondary | ICD-10-CM | POA: Diagnosis not present

## 2021-08-03 DIAGNOSIS — J4541 Moderate persistent asthma with (acute) exacerbation: Secondary | ICD-10-CM | POA: Diagnosis not present

## 2021-08-03 DIAGNOSIS — J22 Unspecified acute lower respiratory infection: Secondary | ICD-10-CM

## 2021-08-03 MED ORDER — PREDNISONE 20 MG PO TABS: ORAL_TABLET | ORAL | 0 refills | Status: DC

## 2021-08-03 MED ORDER — OSELTAMIVIR PHOSPHATE 75 MG PO CAPS: 75.0000 mg | ORAL_CAPSULE | Freq: Two times a day (BID) | ORAL | 0 refills | Status: DC

## 2021-08-03 NOTE — Assessment & Plan Note (Signed)
2d h/o acute respiratory infection symptoms with wheezing in recent flu and strep exposures. Will start treatment with tamiflu given comorbidities and prednisone as per above. Home treatment measures also reviewed. Red flags to seek urgent care reviewed. I did ask her to come in tomorrow afternoon for [facility] testing (COVID, flu, strep). Pt agrees with plan.

## 2021-08-03 NOTE — Telephone Encounter (Signed)
[facility] - Client TELEPHONE ADVICE RECORD [facility] Patient Name: Carol Johnson Gender: Female DOB: 1965/11/05 Age: 55 Y 9 M 27 D Return Phone Number: [phone] (Primary) Address: City/ State/ Zip: [place]  [address] Client [facility] - Client Client Site [place] Provider Glori Bickers, Roque Lias - MD Contact Type Call Who Is Calling Patient / Member / Family / Caregiver Call Type Triage / Clinical Relationship To Patient Self Return Phone Number [phone] (Primary) Chief Complaint WHEEZING Reason for Call Symptomatic / Request for [name] that she has fever, chills, body ache, ear pain, wheezing, and trouble breathing. Translation No Nurse Assessment Nurse: Vallery Sa, RN, Cathy Date/Time (Eastern Time): 08/03/2021 9:18:28 AM Confirm and document reason for call. If symptomatic, describe symptoms. ---[name] states she developed sore throat, muscle aches, chills, fever moderate by touch), cold symptoms about 2 days ago. No severe breathing difficulty or blueness around her lips, but she developed wheezing yesterday. No chest pain. Alert and responsive. (Exposed to Flu by family member) Does the patient have any new or worsening symptoms? ---Yes Will a triage be completed? ---Yes Related visit to physician within the last 2 weeks? ---No Does the PT have any chronic conditions? (i.e. diabetes, asthma, this includes High risk factors for pregnancy, etc.) ---Yes List chronic conditions. ---High Cholesterol and Blood Pressure, Stroke (take blood thinners), Asthma Is this a behavioral health or substance abuse call? ---No Guidelines Guideline Title Affirmed Question Affirmed Notes Nurse Date/Time (Eastern Time) Influenza - Seasonal [1] Fever > 100.0 F (37.8 C) AND [2] diabetes mellitus or weak immune system (e.g., HIV positive,  cancer chemo, splenectomy, [place], RN, Cathy 08/03/2021 9:22:48 AM PLEASE NOTE: All timestamps contained within this report are represented as [place] Standard Time. CONFIDENTIALTY NOTICE: This fax transmission is intended only for the addressee. It contains information that is legally privileged, confidential or otherwise protected from use or disclosure. If you are not the intended recipient, you are strictly prohibited from reviewing, disclosing, copying using or disseminating any of this information or taking any action in reliance on or regarding this information. If you have received this fax in error, please notify us immediately by telephone so that we can arrange for its return to [place]. Phone: [phone], Toll-Free: [phone], Fax: [phone] Page: 2 of 3 Call Id: [id number] Guidelines Guideline Title Affirmed Question Affirmed Notes Nurse Date/Time [name] Time) organ transplant, chronic steroids) Asthma Attack [1] Influenza diagnosed or suspected (e.g., flu present in the community, known flu exposure) AND [2] FLU symptoms (e.g., cough with fever) Vallery Sa, RN, [facility] 08/03/2021 9:25:40 AM Disp. Time [name] Time) Disposition Final User 08/03/2021 9:15:13 AM Send to Urgent Queue Windy Canny 08/03/2021 9:25:22 AM See HCP within 4 Hours (or PCP triage) Vallery Sa, RN, [name] 08/03/2021 9:26:58 AM Call PCP Now Yes Vallery Sa, RN, [name] Disagree/Comply Comply Caller Understands Yes PreDisposition Call Doctor Care Advice Given Per Guideline SEE HCP (OR PCP TRIAGE) WITHIN 4 HOURS: * IF OFFICE WILL BE OPEN: You need to be seen within the next 3 or 4 hours. Call your doctor (or NP/PA) now or as soon as the office opens. * ACETAMINOPHEN - REGULAR STRENGTH TYLENOL: Take 650 mg (two 325 mg pills) by mouth every 4 to 6 hours as needed. Each Regular Strength Tylenol pill has 325 mg of acetaminophen. The most you should take is 10 pills a day (3,250 mg total). Note: In  [place], the maximum is  12 pills a day (3,900 mg total). * Treat fevers above 101 F (38.3 C). The goal of fever therapy is to bring the fever down to a comfortable level. Remember that fever medicine usually lowers fever 2 degrees F (1 - 1 1/2 degrees C). * For pain or fever relief, take either acetaminophen or ibuprofen. PAIN AND FEVER MEDICINES: CALL BACK IF: * You become worse CARE ADVICE given per Influenza - Seasonal (Adult) guideline. CALL PCP NOW: * You need to discuss this with your doctor (or NP/PA). * I'll page the on-call provider now. If you haven't heard from the provider (or me) within 30 minutes, call again. INFLUENZA AND MEDICINES: * You may have a higher risk of complications from asthma if you have influenza (flu). CARE ADVICE given per Asthma Attack (Adult) guideline. * You become worse CALL BACK IF: * Mild wheezing or other asthma symptoms come and go for more than 3 days * Mild asthma symptoms not better after 24 hours * An asthma attack is not better after 2 or 3 quick-relief treatments (such as albuterol by inhaler or nebulizer) 20 minutes apart. * Quick-relief asthma medicine (such as albuterol by inhaler or nebulizer) is needed more often than every 4 hours PLEASE NOTE: All timestamps contained within this report are represented as [place] Standard Time. CONFIDENTIALTY NOTICE: This fax transmission is intended only for the addressee. It contains information that is legally privileged, confidential or otherwise protected from use or disclosure. If you are not the intended recipient, you are strictly prohibited from reviewing, disclosing, copying using or disseminating any of this information or taking any action in reliance on or regarding this information. If you have received this fax in error, please notify us immediately by telephone so that we can arrange for its return to [place]. Phone: [phone], Toll-Free: [phone], Fax: [phone] Page: 3 of 3 Call Id:  [id number] Comments User: Berton Mount, RN Date/Time [name] Time): 08/03/2021 9:33:24 AM [name] states they don't have any appointments available within the next 4 hours. [name] advises that she go to an urgent care. [name] declined the Go to urgent care recommendation and states she has a 4pm appointment scheduled at the office that she plans to keep. User: Berton Mount, RN Date/Time [name] Time): 08/03/2021 9:37:21 AM [name] notified that Alleah plans to keep her 4pm appointment at the office. Encouraged Lydia to call back as needed or to go to urgent care sooner. Referrals REFERRED TO PCP OFFIC

## 2021-08-03 NOTE — Telephone Encounter (Signed)
Noted  

## 2021-08-03 NOTE — Telephone Encounter (Signed)
Juliann Pulse RN with Access nurse called for appt within 4 hrs; no available appts in 4 hr period. Pt already has video visit appt scheduled 08/03/21 at 4:[address] Danise Mina. Juliann Pulse said that pt declined UC appt and will keep appt with Dr Danise Mina this afternoon. Kathy reenforced UC precautions and pt voiced understanding to [employer] but pt thinks she will be OK to wait for appt this afternoon. Will send over access nurse note when available. Sending note to Dr Danise Mina and Lattie Haw CMA.

## 2021-08-03 NOTE — Telephone Encounter (Signed)
Please see last entry in this phone note and pt has VV appt with DR G today at [place]. Pt was given advice note by Nelida Gores for access nurse with UC precautions and pt voiced understanding per Juliann Pulse.

## 2021-08-03 NOTE — Progress Notes (Signed)
Patient ID: Carol Johnson, female    DOB: 25-Mar-1966, 55 y.o.   MRN: [id number]  Virtual visit completed through [employer], a video enabled telemedicine application. Due to national recommendations of social distancing due to COVID-19, a virtual visit is felt to be most appropriate for this patient at this time. Reviewed limitations, risks, security and privacy concerns of performing a virtual visit and the availability of in person appointments. I also reviewed that there may be a patient responsible charge related to this service. The patient agreed to proceed.   Patient location: home Provider location: [place] at [facility], office Persons participating in this virtual visit: patient, provider   If any vitals were documented, they were collected by patient at home unless specified below.    Pulse 79   Ht 5' 4"  (1.626 m)   Wt 229 lb (103.9 kg)   LMP 11/29/2015   SpO2 96% Comment: RA  BMI 39.31 kg/m    CC: cough, feverish, body aches Subjective:   HPI: Carol Johnson is a 55 y.o. female presenting on 08/03/2021 for Cough (C/o cough, congestion, fever [has not taken], body aches and bilateral ear pain, wheezing.  Sxs started 08/01/21.  Latest COVID shot- 06/19/20, no flu this season. )   2d h/o cough, ST, HA, body aches, head congestion, feverish, wheezing, bilateral muffled hearing R>L with ear pain.   No abd pain. No ear drainage.   H/o asthma.  She's been using her albuterol inhaler 2 puffs Q4 hours.  Not around smokers.   H/o UC on lialda with chronic nausea, diarrhea.   Nephew recently with influenza.  Son also diagnosed with strep throat.  She was around both of them.   No documented flu shot.      Relevant past medical, surgical, family and social history reviewed and updated as indicated. Interim medical history since our last visit reviewed. Allergies and medications reviewed and updated. Outpatient Medications Prior to Visit  Medication Sig Dispense  Refill   acetaminophen (TYLENOL) 325 MG tablet Take 2 tablets (650 mg total) by mouth every 4 (four) hours as needed for mild pain (or temp > 37.5 C (99.5 F)).     albuterol (VENTOLIN HFA) 108 (90 Base) MCG/ACT inhaler INHALE 2 PUFFS INTO THE LUNGS EVERY 4 (FOUR) HOURS AS NEEDED FOR WHEEZING, COUGH, OR SHORTNESS OF BREATH 6.7 each 3   atorvastatin (LIPITOR) 80 MG tablet TAKE 1 TABLET BY MOUTH DAILY AT 6 PM. 90 tablet 1   buPROPion (WELLBUTRIN XL) 300 MG 24 hr tablet Take 1 tablet (300 mg total) by mouth daily. 90 tablet 3   citalopram (CELEXA) 20 MG tablet Take 1 tablet (20 mg total) by mouth daily. 90 tablet 3   colestipol (COLESTID) 1 g tablet TAKE 2 TABLETS (2 G TOTAL) BY MOUTH DAILY. 180 tablet 0   DERMA-SMOOTHE/FS SCALP 0.01 % OIL Apply 1 a small amount to skin three times a week  Part hair, apply to scalp, cover with plastic cap, wash out in the morning     fexofenadine (ALLEGRA) 180 MG tablet Take 180 mg by mouth daily as needed.      fluticasone (FLONASE) 50 MCG/ACT nasal spray Place 2 sprays into both nostrils daily. 16 g 6   ketoconazole (NIZORAL) 2 % shampoo Apply 1 application topically 2 (two) times a week.     loperamide (IMODIUM) 2 MG capsule Take 1 capsule (2 mg total) by mouth as needed for diarrhea or loose stools. 30 capsule  0   mesalamine (LIALDA) 1.2 g EC tablet TAKE 4 TABLETS BY MOUTH DAILY WITH BREAKFAST. 360 tablet 2   nitroGLYCERIN (NITRODUR - DOSED IN MG/24 HR) 0.2 mg/hr patch Apply 1/4 patch to affected area as directed by MD and change every 24 hours. 30 patch 2   pantoprazole (PROTONIX) 40 MG tablet TAKE 1 TABLET BY MOUTH EVERY DAY 90 tablet 0   vitamin B-12 (CYANOCOBALAMIN) 100 MCG tablet Take 1 tablet (100 mcg total) by mouth daily. 1 tablet    XARELTO 20 MG TABS tablet TAKE 1 TABLET (20 MG TOTAL) BY MOUTH DAILY WITH SUPPER. 90 tablet 3   zolpidem (AMBIEN CR) 12.5 MG CR tablet TAKE 1 TABLET (12.5 MG TOTAL) BY MOUTH AT BEDTIME AS NEEDED. FOR SLEEP 30 tablet 3   No  facility-administered medications prior to visit.     Per HPI unless specifically indicated in ROS section below Review of Systems Objective:  Pulse 79   Ht 5' 4"  (1.626 m)   Wt 229 lb (103.9 kg)   LMP 11/29/2015   SpO2 96% Comment: RA  BMI 39.31 kg/m   Wt Readings from Last 3 Encounters:  08/03/21 229 lb (103.9 kg)  07/02/21 236 lb (107 kg)  07/02/21 235 lb (106.6 kg)       Physical exam: Gen: alert, NAD, not ill appearing Pulm: speaks in complete sentences without increased work of breathing, intermittent cough present, hoarse voice Psych: normal mood, normal thought content      Assessment & Plan:   Problem List Items Addressed This Visit     Asthma    Suspect acute mild asthma exacerbation due to current respiratory infection with increased wheezing noted, however no signs of respiratory distress on video visit. Will Rx prednisone 7d taper and discussed scheduled albuterol inhaler use. Advised seek in-person care if any worsening symptom. She states she normally does well with short prednisone courses.       Relevant Medications   predniSONE (DELTASONE) 20 MG tablet   Ulcerative colitis with complication ([place])   Acute respiratory infection - Primary    2d h/o acute respiratory infection symptoms with wheezing in recent flu and strep exposures. Will start treatment with tamiflu given comorbidities and prednisone as per above. Home treatment measures also reviewed. Red flags to seek urgent care reviewed. I did ask her to come in tomorrow afternoon for [facility] testing (COVID, flu, strep). Pt agrees with plan.       Relevant Medications   oseltamivir (TAMIFLU) 75 MG capsule     Meds ordered this encounter  Medications   oseltamivir (TAMIFLU) 75 MG capsule    Sig: Take 1 capsule (75 mg total) by mouth 2 (two) times daily.    Dispense:  10 capsule    Refill:  0   predniSONE (DELTASONE) 20 MG tablet    Sig: Take two tablets daily for 3 days followed by one tablet  daily for 4 days    Dispense:  10 tablet    Refill:  0    No orders of the defined types were placed in this encounter.   I discussed the assessment and treatment plan with the patient. The patient was provided an opportunity to ask questions and all were answered. The patient agreed with the plan and demonstrated an understanding of the instructions. The patient was advised to call back or seek an in-person evaluation if the symptoms worsen or if the condition fails to improve as anticipated.  Follow up plan: No  follow-ups on file.  Ria Bush, MD

## 2021-08-03 NOTE — Assessment & Plan Note (Addendum)
Suspect acute mild asthma exacerbation due to current respiratory infection with increased wheezing noted, however no signs of respiratory distress on video visit. Will Rx prednisone 7d taper and discussed scheduled albuterol inhaler use. Advised seek in-person care if any worsening symptom. She states she normally does well with short prednisone courses.

## 2021-08-03 NOTE — Progress Notes (Signed)
Ordering testing for tomorrow curbside at [facility].

## 2021-08-04 LAB — POC INFLUENZA A&B (BINAX/QUICKVUE)
Influenza A, POC: NEGATIVE
Influenza B, POC: NEGATIVE

## 2021-08-04 LAB — POCT RAPID STREP A (OFFICE): Rapid Strep A Screen: NEGATIVE

## 2021-08-04 LAB — POC COVID19 BINAXNOW: SARS Coronavirus 2 Ag: NEGATIVE

## 2021-08-04 NOTE — Progress Notes (Deleted)
    [name] [name], MD, [place] at [facility] [place], [address]  Phone: [phone]  FAX: [phone]  Carol Johnson - 55 y.o. female  MRN [id number]  Date of Birth: 1965/10/13  Date: 08/05/2021  PCP: Abner Greenspan, MD  Referral: Abner Greenspan, MD  No chief complaint on file.   This visit occurred during the SARS-CoV-2 public health emergency.  Safety protocols were in place, including screening questions prior to the visit, additional usage of staff PPE, and extensive cleaning of exam room while observing appropriate contact time as indicated for disinfecting solutions.   Subjective:   Carol Johnson is a 55 y.o. very pleasant female patient with There is no height or weight on file to calculate BMI. who presents with the following:  She presents with continued L heel pain, stress fx calcaneous with achilles tendinopathy and large insertional spur.    Review of Systems is noted in the HPI, as appropriate   Objective:   LMP 11/29/2015   GEN: No acute distress; alert,appropriate. PULM: Breathing comfortably in no respiratory distress PSYCH: Normally interactive.   Laboratory and Imaging Data:  Assessment and Plan:   ***

## 2021-08-05 ENCOUNTER — Telehealth: Payer: Self-pay | Admitting: Family Medicine

## 2021-08-05 ENCOUNTER — Ambulatory Visit: Payer: No Typology Code available for payment source | Admitting: Family Medicine

## 2021-08-05 DIAGNOSIS — M84375A Stress fracture, left foot, initial encounter for fracture: Secondary | ICD-10-CM

## 2021-08-05 DIAGNOSIS — M79672 Pain in left foot: Secondary | ICD-10-CM

## 2021-08-05 DIAGNOSIS — J22 Unspecified acute lower respiratory infection: Secondary | ICD-10-CM

## 2021-08-05 DIAGNOSIS — M7662 Achilles tendinitis, left leg: Secondary | ICD-10-CM

## 2021-08-05 MED ORDER — ALBUTEROL SULFATE (2.5 MG/3ML) 0.083% IN NEBU: 2.5000 mg | INHALATION_SOLUTION | Freq: Four times a day (QID) | RESPIRATORY_TRACT | 1 refills | Status: AC | PRN

## 2021-08-05 MED ORDER — AZITHROMYCIN 250 MG PO TABS: ORAL_TABLET | ORAL | 0 refills | Status: DC

## 2021-08-05 NOTE — Telephone Encounter (Signed)
PLEASE NOTE: All timestamps contained within this report are represented as [place] Standard Time. CONFIDENTIALTY NOTICE: This fax transmission is intended only for the addressee. It contains information that is legally privileged, confidential or otherwise protected from use or disclosure. If you are not the intended recipient, you are strictly prohibited from reviewing, disclosing, copying using or disseminating any of this information or taking any action in reliance on or regarding this information. If you have received this fax in error, please notify us immediately by telephone so that we can arrange for its return to [place]. Phone: [phone], Toll-Free: [phone], Fax: [phone] Page: 1 of 2 Call Id: [id number] [facility] - Client TELEPHONE ADVICE RECORD [facility] Patient Name: Carol Johnson ETT Gender: Female DOB: [birth year] Age: 55 Y 9 M 29 D Return Phone Number: [phone] (Primary) Address: City/ State/ Zip: [place]  [address] Client [place] [facility] - Client Client Site [place] Provider Glori Bickers, Roque Lias - MD Contact Type Call Who Is Calling Patient / Member / Family / Caregiver Call Type Triage / Clinical Relationship To Patient Self Return Phone Number [phone] (Primary) Chief Complaint BREATHING - shortness of breath or sounds breathless Reason for Call Symptomatic / Request for [place] states she has shortness of breath, and unable to breathe. She was dx with respiratory infection and needs abx. Translation No Nurse Assessment Nurse: Radford Pax, RN, Eugene Garnet Date/Time [name] Time): 08/05/2021 8:28:19 AM Confirm and document reason for call. If symptomatic, describe symptoms. ---Is SOB, very hard for nurse to hear. Has been dx with a respiratory infection. States she has been doing albuterol, and declines to do rescue treatment at this time.  Requesting abx. Does the patient have any new or worsening symptoms? ---Yes Will a triage be completed? ---Yes Related visit to physician within the last 2 weeks? ---No Does the PT have any chronic conditions? (i.e. diabetes, asthma, this includes High risk factors for pregnancy, etc.) ---Yes List chronic conditions. ---COPD Is this a behavioral health or substance abuse call? ---No Guidelines Guideline Title Affirmed Question Affirmed Notes Nurse Date/Time (Eastern Time) Asthma Attack SEVERE asthma attack (e.g., struggling for each breath, speaks in single words, loud wheezes, pulse >120) (RED Zone) Turner, RN, Eugene Garnet 08/05/2021 8:30:29 AM PLEASE NOTE: All timestamps contained within this report are represented as [place] Standard Time. CONFIDENTIALTY NOTICE: This fax transmission is intended only for the addressee. It contains information that is legally privileged, confidential or otherwise protected from use or disclosure. If you are not the intended recipient, you are strictly prohibited from reviewing, disclosing, copying using or disseminating any of this information or taking any action in reliance on or regarding this information. If you have received this fax in error, please notify us immediately by telephone so that we can arrange for its return to [place]. Phone: [phone], Toll-Free: [phone], Fax: [phone] Page: 2 of 2 Call Id: [id number] [place]. Time [name] Time) Disposition Final User 08/05/2021 8:27:04 AM Send to Urgent Joana Reamer 08/05/2021 8:44:30 AM Send To RN Personal Radford Pax, RN, Rebekah 08/05/2021 9:12:03 AM 911 Outcome Documentation Turner, RN, Eugene Garnet Reason: Was able to send message to office for PCP nurse that pt declined 911 and is requesting to be seen in office. Followed up with pt who was still SOB. Advised to call 911 but she continues to refuse. 08/05/2021 8:37:12 AM Call EMS 911 Now Yes Turner, RN, Sharion Settler Disagree/Comply  Disagree Caller Understands No PreDisposition  Call Doctor Care Advice Given Per Guideline CALL EMS 911 NOW: * Immediate medical attention is needed. You need to hang up and call 911 (or an ambulance). * Triager Discretion: I'll call you back in a few minutes to be sure you were able to reach them. CARE ADVICE given per Asthma Attack (Adult) guideline. Comments User: Susie Cassette, RN Date/Time [name] Time): 08/05/2021 8:37:07 AM Unable to reach office by backline after multiple attempts. Main number has been called. User: Susie Cassette, RN Date/Time [name] Time): 08/05/2021 9:11:46 AM Was able to send message to office for PCP nurse that pt declined 911 and is requesting to be seen in office. Followed up with pt who was still SOB. Advised to call 911 but she continues to refuse. Referrals GO TO FACILITY REFUSED

## 2021-08-05 NOTE — Telephone Encounter (Signed)
Pt notified of Dr. Marliss Coots comments and recommendations and Rxs for abx and nebulizer sent, pt wants to get xray at [facility], instructions given on where to get xray please put order in. ER precautions given

## 2021-08-05 NOTE — Addendum Note (Signed)
Addended by: Loura Pardon A on: 08/05/2021 10:34 AM   Modules accepted: Orders

## 2021-08-05 NOTE — Addendum Note (Signed)
Addended by: Loura Pardon A on: 08/05/2021 10:55 AM   Modules accepted: Orders

## 2021-08-05 NOTE — Telephone Encounter (Signed)
Carol Johnson with [facility] called stating that pt is having shortness of breath and would like an antibiotic called in. Carol Johnson told pt that she nee to call 911 pt refused. Pt would like a call back. Please advise.

## 2021-08-05 NOTE — Telephone Encounter (Signed)
Called pt she wanted to cancel her appt for today she didn't want to drive to [place]. Pt stated that she has an URI and wanted to be seen today for a breathing treatment. She was having SOB and sever congestion. I transferred her to [facility]

## 2021-08-05 NOTE — Telephone Encounter (Signed)
I put in the order and will watch for result  Thanks

## 2021-08-05 NOTE — Telephone Encounter (Signed)
PLEASE NOTE: All timestamps contained within this report are represented as [place] Standard Time. CONFIDENTIALTY NOTICE: This fax transmission is intended only for the addressee. It contains information that is legally privileged, confidential or otherwise protected from use or disclosure. If you are not the intended recipient, you are strictly prohibited from reviewing, disclosing, copying using or disseminating any of this information or taking any action in reliance on or regarding this information. If you have received this fax in error, please notify us immediately by telephone so that we can arrange for its return to [place]. Phone: [phone], Toll-Free: [phone], Fax: [phone] Page: 1 of 1 Call Id: [id number] [place] [facility] - Client Nonclinical Telephone Record  [facility] Client [place] [facility] - Client Client Site [place] Provider Loura Pardon - MD Contact Type Call Who Is Calling Patient / Member / Family / Caregiver Caller Name [place] Phone Number [phone] Patient Name Carol Johnson Patient DOB March 22, 1966 Call Type Message Only Information Provided Reason for Call Request to Schedule Office Appointment Initial Comment Caller states she needs to schedule an appt. Patient request to speak to RN No Additional Comment Decline triage. Office hours provided. Disp. Time Disposition Final User 08/05/2021 7:25:56 AM General Information Provided Yes Cathlean Sauer Call Closed By: Cathlean Sauer Transaction Date/Time: 08/05/2021 7:22:49 AM (ET)

## 2021-08-05 NOTE — Addendum Note (Signed)
Addended by: Loura Pardon A on: 08/05/2021 11:31 AM   Modules accepted: Orders

## 2021-08-05 NOTE — Telephone Encounter (Signed)
I sent in albuterol for nmt machine-let me know if any problems   She needs first available appt for the uri symptoms/UC if needed  May need covid or flu testing if not done already  Unsure if she will need cxr and/or prednisone as well  Cannot send abx w/o eval

## 2021-08-05 NOTE — Telephone Encounter (Signed)
Sorry, I did not realize she had seen Dr Darnell Level and I then discussed case with him   I sent in azithromycin to her pharmacy  I would like to get a cxr also -please let me know what location she prefers for that and I will put in order   If sob again or severe wheezing please alert [place] and get to the ER

## 2021-08-05 NOTE — Telephone Encounter (Signed)
Seen virtually Monday with negative COVID and flu and strep swabs.  Treated with tamiflu given recent flu exposure as well as prednisone taper for asthma exacerbation.  Was recommended at that time in-person urgent evaluation if not improving with treatment.  Agree with albuterol machine for nebulization treatments.  Will forward back to PCP who is addressing. Thank you.

## 2021-08-05 NOTE — Telephone Encounter (Signed)
Called and spoke to patient by telephone and was advised that she is having SOB and wheezing. Patient stated that she has been wheezing since Sunday .Patient stated that she has a history of pneumonia.  Patient stated that she knows that she needs a breathing treatment. Patient stated that she has called 911 and they are on the way. I stayed on the phone with the patient until she opened the door to let the paramedics in. Advised patient to call back and update Dr. Glori Bickers regarding the outcome of her treatment.

## 2021-08-05 NOTE — Telephone Encounter (Signed)
Patient called back stating that the paramedics did an EKG and gave her a breathing treatment which seemed to help some. Patient stated that she knows that she has an upper respiratory infection and needs an antibiotic sent in. Patient stated that she has a history of asthma. Patient stated that she has a nebulizer machine and needs medication for that to do breathing treatments herself. Patient was given ER precautions again and was advised that she is not going to the ER. Pharmacy [facility]

## 2021-08-05 NOTE — Telephone Encounter (Signed)
See other phone note.  Called and spoke to patient by telephone and was advised that she is having SOB and wheezing. Patient stated that she has been wheezing since Sunday .Patient stated that she has a history of pneumonia.  Patient stated that she knows that she needs a breathing treatment. Patient stated that she has called 911 and they are on the way. I stayed on the phone with the patient until she opened the door to let the paramedics in. Advised patient to call back and update Dr. Glori Bickers regarding the outcome of her treatment.

## 2021-08-07 ENCOUNTER — Telehealth: Payer: Self-pay | Admitting: Family Medicine

## 2021-08-07 MED ORDER — AMOXICILLIN-POT CLAVULANATE 875-125 MG PO TABS: 1.0000 | ORAL_TABLET | Freq: Two times a day (BID) | ORAL | 0 refills | Status: DC

## 2021-08-07 NOTE — Telephone Encounter (Signed)
Results have been relayed to the patient. The patient verbalized understanding. No questions at this time.

## 2021-08-07 NOTE — Telephone Encounter (Signed)
Suspicious for ear infection in setting of her uri  Sent in augmentin (this would also cover a bacterial sinus or throat infection if she had one)   If symptoms become severe please go to the ER Otherwise keep [place] posted  Follow up if not improving next week

## 2021-08-07 NOTE — Telephone Encounter (Signed)
She saw Dr Darnell Level on 12/5 for uri  Please triage: What ear or both? Pressure/pain or both? Sharp or dull? Any drainage from ear/s Any redness of ext ear?  Swelling of ears? Change in hearing? Any fever ?   Thanks

## 2021-08-07 NOTE — Telephone Encounter (Signed)
Pt called stating that she would like something stronger called in for her ear pain. Please advise.

## 2021-08-07 NOTE — Telephone Encounter (Signed)
What ear or both? Both ears, right is worse Pressure/pain or both? pain Sharp or dull? sharp Any drainage from ear/s no drainage Any redness of ext ear? No redness or heat Swelling of ears? Selling in neck under ears Change in hearing? No change Any fever ? Fever off and on the whole time. NO thermometer.  Sore throat is horrible unsure if PND thinks its more like strept.

## 2021-08-23 ENCOUNTER — Other Ambulatory Visit: Payer: Self-pay | Admitting: Family

## 2021-08-23 DIAGNOSIS — J069 Acute upper respiratory infection, unspecified: Secondary | ICD-10-CM

## 2021-09-07 ENCOUNTER — Other Ambulatory Visit: Payer: Self-pay

## 2021-09-07 ENCOUNTER — Encounter: Payer: Self-pay | Admitting: Family Medicine

## 2021-09-07 ENCOUNTER — Ambulatory Visit: Payer: No Typology Code available for payment source | Admitting: Family Medicine

## 2021-09-07 VITALS — BP 120/80 | HR 88 | Temp 98.1°F | Ht 64.0 in | Wt 237.1 lb

## 2021-09-07 DIAGNOSIS — G8929 Other chronic pain: Secondary | ICD-10-CM | POA: Diagnosis not present

## 2021-09-07 DIAGNOSIS — M84375A Stress fracture, left foot, initial encounter for fracture: Secondary | ICD-10-CM | POA: Diagnosis not present

## 2021-09-07 DIAGNOSIS — M79672 Pain in left foot: Secondary | ICD-10-CM

## 2021-09-07 NOTE — Progress Notes (Signed)
[name], MD, [place] at [facility] [place], [address]  Phone: [phone]   FAX: [phone]  [name] - 56 y.o. female   MRN [id number]   Date of Birth: 01-14-1966  Date: 09/07/2021   PCP: Abner Greenspan, MD   Referral: Abner Greenspan, MD  Chief Complaint  Patient presents with   Follow-up    Stress Fx Left Calcaneus    This visit occurred during the SARS-CoV-2 public health emergency.  Safety protocols were in place, including screening questions prior to the visit, additional usage of staff PPE, and extensive cleaning of exam room while observing appropriate contact time as indicated for disinfecting solutions.   Subjective:   Carol Johnson is a 56 y.o. very pleasant female patient with Body mass index is 40.7 kg/m. who presents with the following:  F/u L heel pain: Continued left heel pain with subacute, probable stress fracture at the enthesopathy at the Achilles insertion.  She continues to have some pain here, and she is having trouble with ambulation.  She never recovered with her normal gait after her prior stroke.  Her pain is located in the posterior most aspect of the calcaneus.  She has had difficulty transitioning out of her cam walker boot.  She has been immobilized now for several months.  Has been off and on with her cam walker.  Right now, does not feel good.   For a couple of weeks, it did feel ok, but now not so much.  Feels bad.  She thinks that it is at the very least is approaching the worst point. No additional trauma.  Extreme supination with walking  Review of Systems is noted in the HPI, as appropriate  Patient Active Problem List   Diagnosis Date Noted   Cerebrovascular accident ([place]) 01/12/2021   Chronic neck pain 01/12/2021   Gait abnormality 01/12/2021   Snores 01/12/2021   Vertigo 10/29/2020   Hearing loss 10/29/2020   Acute respiratory infection  09/19/2020   Hypercoagulable state ([place]) 12/18/2019   Hair loss 12/03/2019   Aneurysm of anterior cerebral artery 12/03/2019   Abnormality of gait 09/04/2019   Hemiparesis affecting nondominant side as late effect of stroke ([place]) 07/24/2019   Muscle weakness (generalized) 06/11/2019   Hemiplegia and hemiparesis following cerebral infarction affecting left non-dominant side ([place]) 06/11/2019   Major depressive disorder    Hypokalemia    Acute blood loss anemia    Ulcerative colitis with complication ([place])    H/O: CVA (cerebrovascular accident) 05/23/2019   Thrombocytosis 05/23/2019   Coronary artery disease involving native coronary artery of native heart without angina pectoris 03/21/2018   Coronary artery calcification 02/10/2018   Obesity (BMI 30-39.9) 02/10/2018   Former smoker 01/15/2018   Dyspnea on exertion 12/21/2017   Carpal tunnel syndrome 12/21/2017   Stress incontinence 12/21/2017   Paresthesia of both feet 07/04/2017   Always thirsty 07/04/2017   Abdominal bloating 12/01/2015   Fatigue 12/01/2015   Vitamin B12 deficiency 12/01/2015   Lymphocytic colitis 01/16/2015   Anemia, iron deficiency 01/16/2015   Anxiety state 12/20/2014   Diarrhea 11/27/2014   Insomnia 11/27/2014   History of DVT (deep vein thrombosis) 09/17/2014   OSA (obstructive sleep apnea) 09/02/2014   Gastritis, chronic 06/06/2014   Unspecified gastritis and gastroduodenitis without mention of hemorrhage 05/03/2014   Abdominal pain, epigastric 04/19/2014   Internal hemorrhoids with other complication 54/27/0623   Routine general medical  examination at a health care facility 10/14/2011   Thyroid nodule 05/16/2011   Rectal bleeding 11/24/2010   Hemorrhoids, internal 11/24/2010   Irritable bowel syndrome (IBS) 11/24/2010   ANEMIA 10/02/2009   Hyperlipidemia 07/18/2009   Essential hypertension 07/18/2009   DYSPNEA ON EXERTION 07/18/2009   THYROID CYST  01/31/2008   ANXIETY 01/31/2008   Depression with anxiety 01/31/2008   GASTRIC ULCER, HX OF 01/31/2008   FIBROIDS, UTERUS 11/13/2007   Allergic rhinitis 11/13/2007   Asthma 11/13/2007   GERD 11/13/2007   IRRITABLE BOWEL SYNDROME 11/13/2007   INTERNAL HEMORRHOIDS 11/12/2003    Past Medical History:  Diagnosis Date   Abdominal pain, unspecified site    Allergic rhinitis    Anxiety    Asthma    Depression    DVT (deep venous thrombosis) ([place]) 2016   DVT of deep femoral vein, left (HCC)    Fibroid uterus    GERD (gastroesophageal reflux disease)    Hypercholesterolemia    Hypertension    Internal hemorrhoid    Irritable bowel syndrome    Lymphocytic colitis 01/02/2015   Pneumonia 1998   Stroke ([place]) 2020   left sided weekness   Ulcerative colitis ([place])    URI (upper respiratory infection)    Uterine fibroid    Vitamin B12 deficiency     Past Surgical History:  Procedure Laterality Date   CESAREAN SECTION  08/30/2002   CHOLECYSTECTOMY     laparoscopic "gallstones"   ESOPHAGOGASTRODUODENOSCOPY (EGD) WITH PROPOFOL N/A 05/03/2014   Procedure: ESOPHAGOGASTRODUODENOSCOPY (EGD) WITH PROPOFOL;  Surgeon: Inda Castle, MD;  Location: [facility];  Service: Endoscopy;  Laterality: N/A;   UTERINE FIBROID SURGERY      Family History  Problem Relation Age of Onset   Hyperlipidemia Mother    Mitral valve prolapse Mother    Heart disease Mother        arrhythmia   Pulmonary fibrosis Father    Colon cancer Paternal Aunt    Heart disease Paternal Aunt    Heart disease Paternal Uncle    Heart failure Maternal Grandmother    Uterine cancer Paternal Grandmother        with mets to the colon    Kidney disease Cousin    Esophageal cancer Neg Hx    Stomach cancer Neg Hx    Rectal cancer Neg Hx      Objective:   BP 120/80    Pulse 88    Temp 98.1 F (36.7 C) (Temporal)    Ht 5' 4"  (1.626 m)    Wt 237 lb 2 oz (107.6 kg)    LMP  11/29/2015    SpO2 96%    BMI 40.70 kg/m   GEN: No acute distress; alert,appropriate. PULM: Breathing comfortably in no respiratory distress PSYCH: Normally interactive.    FEET: Left Echymosis: no Edema: no ROM: full LE B Gait: Notably antalgic with very dramatic supination  MT pain: no Callus pattern: none Lateral Mall: NT Medial Mall: NT Talus: NT Navicular: NT Cuboid: NT Calcaneous: Tender to palpation posteriorly Metatarsals: NT 5th MT: NT Phalanges: NT Achilles: Tender at the insertion Plantar Fascia: NT Fat Pad: NT Peroneals: NT Post Tib: NT Great Toe: Nml motion Ant Drawer: neg ATFL: NT CFL: NT Deltoid: NT  Sensation: intact   Laboratory and Imaging Data: DG Os Calcis Left  Result Date: 07/03/2021 CLINICAL DATA:  Follow-up heel enthesopathy LEFT foot EXAM: LEFT OS CALCIS - 2+ VIEW COMPARISON:  05/21/2021 FINDINGS: Osseous mineralization normal. Joint spaces  preserved. Plantar calcaneal spur formation. Bulky spurs/enthesopathy at Achilles insertion. Again identified ill-defined lucency at the medial aspect of the Achilles insertion spur/enthesopathy, consistent with fracture. IMPRESSION: Again identified subacute fracture through spurring/enthesopathy at Achilles insertion. Additional plantar calcaneal spur LEFT foot. Electronically Signed   By: Lavonia Dana M.D.   On: 07/03/2021 09:46     Assessment and Plan:     ICD-10-CM   1. Chronic heel pain, left  M79.672 MR HEEL LEFT WO CONTRAST   G89.29     2. Stress fracture of left calcaneus  M84.375A MR HEEL LEFT WO CONTRAST     Chronic heel pain with calcific tendinitis and stress fracture at the left calcaneus.  She continues to have pain in the posterior heel.  Obtain an MRI of the left calcaneus to assess for acute or acute on chronic stress fracture without healing.  Bone healing and edema will help delineate additional plans of care.  If a normal bone MRI of the heel then we can aggressively get her into  physical therapy and transition out of her cam walker boot.  She has been immobilized off and on for 4 months.  She continues to do poorly and has posterior ear pain.  She does have some altered gait secondary to prior stroke.  Social: Decidedly impacting her baseline walking   Medications Discontinued During This Encounter  Medication Reason   amoxicillin-clavulanate (AUGMENTIN) 875-125 MG tablet Completed Course   azithromycin (ZITHROMAX Z-PAK) 250 MG tablet Completed Course   oseltamivir (TAMIFLU) 75 MG capsule Completed Course   predniSONE (DELTASONE) 20 MG tablet Completed Course   colestipol (COLESTID) 1 g tablet Completed Course   ketoconazole (NIZORAL) 2 % shampoo Completed Course   Orders Placed This Encounter  Procedures   MR HEEL LEFT WO CONTRAST    Follow-up: No follow-ups on file.  [employer] One speech-to-text software was used for transcription in this dictation.  Possible transcriptional errors can occur using Editor, commissioning.   Signed,  Maud Deed. Donnis Phaneuf, MD   Outpatient Encounter Medications as of 09/07/2021  Medication Sig   acetaminophen (TYLENOL) 325 MG tablet Take 2 tablets (650 mg total) by mouth every 4 (four) hours as needed for mild pain (or temp > 37.5 C (99.5 F)).   albuterol (VENTOLIN HFA) 108 (90 Base) MCG/ACT inhaler INHALE 2 PUFFS INTO THE LUNGS EVERY 4 (FOUR) HOURS AS NEEDED FOR WHEEZING, COUGH, OR SHORTNESS OF BREATH   atorvastatin (LIPITOR) 80 MG tablet TAKE 1 TABLET BY MOUTH DAILY AT 6 PM.   buPROPion (WELLBUTRIN XL) 300 MG 24 hr tablet Take 1 tablet (300 mg total) by mouth daily.   citalopram (CELEXA) 20 MG tablet Take 1 tablet (20 mg total) by mouth daily.   DERMA-SMOOTHE/FS SCALP 0.01 % OIL Apply 1 a small amount to skin three times a week  Part hair, apply to scalp, cover with plastic cap, wash out in the morning   fexofenadine (ALLEGRA) 180 MG tablet Take 180 mg by mouth daily as needed.    fluticasone (FLONASE) 50  MCG/ACT nasal spray Place 2 sprays into both nostrils daily.   loperamide (IMODIUM) 2 MG capsule Take 1 capsule (2 mg total) by mouth as needed for diarrhea or loose stools.   mesalamine (LIALDA) 1.2 g EC tablet TAKE 4 TABLETS BY MOUTH DAILY WITH BREAKFAST.   pantoprazole (PROTONIX) 40 MG tablet TAKE 1 TABLET BY MOUTH EVERY DAY   vitamin B-12 (CYANOCOBALAMIN) 100 MCG tablet Take 1 tablet (100 mcg total) by  mouth daily.   XARELTO 20 MG TABS tablet TAKE 1 TABLET (20 MG TOTAL) BY MOUTH DAILY WITH SUPPER.   zolpidem (AMBIEN CR) 12.5 MG CR tablet TAKE 1 TABLET (12.5 MG TOTAL) BY MOUTH AT BEDTIME AS NEEDED. FOR SLEEP   [DISCONTINUED] colestipol (COLESTID) 1 g tablet TAKE 2 TABLETS (2 G TOTAL) BY MOUTH DAILY.   [DISCONTINUED] ketoconazole (NIZORAL) 2 % shampoo Apply 1 application topically 2 (two) times a week.   albuterol (PROVENTIL) (2.5 MG/3ML) 0.083% nebulizer solution Take 3 mLs (2.5 mg total) by nebulization every 6 (six) hours as needed for wheezing or shortness of breath. (Patient not taking: Reported on 09/07/2021)   nitroGLYCERIN (NITRODUR - DOSED IN MG/24 HR) 0.2 mg/hr patch Apply 1/4 patch to affected area as directed by MD and change every 24 hours. (Patient not taking: Reported on 09/07/2021)   [DISCONTINUED] amoxicillin-clavulanate (AUGMENTIN) 875-125 MG tablet Take 1 tablet by mouth 2 (two) times daily.   [DISCONTINUED] azithromycin (ZITHROMAX Z-PAK) 250 MG tablet Take 2 pills by mouth today and then 1 pill daily for 4 days   [DISCONTINUED] oseltamivir (TAMIFLU) 75 MG capsule Take 1 capsule (75 mg total) by mouth 2 (two) times daily.   [DISCONTINUED] predniSONE (DELTASONE) 20 MG tablet Take two tablets daily for 3 days followed by one tablet daily for 4 days   No facility-administered encounter medications on file as of 09/07/2021.

## 2021-09-14 ENCOUNTER — Encounter: Payer: Self-pay | Admitting: *Deleted

## 2021-09-29 ENCOUNTER — Other Ambulatory Visit: Payer: Self-pay | Admitting: Family Medicine

## 2021-09-29 ENCOUNTER — Telehealth (INDEPENDENT_AMBULATORY_CARE_PROVIDER_SITE_OTHER): Payer: No Typology Code available for payment source | Admitting: Family Medicine

## 2021-09-29 ENCOUNTER — Encounter: Payer: Self-pay | Admitting: Family Medicine

## 2021-09-29 ENCOUNTER — Other Ambulatory Visit: Payer: Self-pay

## 2021-09-29 ENCOUNTER — Telehealth: Payer: Self-pay

## 2021-09-29 DIAGNOSIS — U071 COVID-19: Secondary | ICD-10-CM | POA: Diagnosis not present

## 2021-09-29 NOTE — Telephone Encounter (Signed)
Name of Medication: Zolpidem Name of Pharmacy: [facility] [address] Henrietta Dine or Written Date and Quantity: 05/06/21 #30 tabs with 3 refills Last Office Visit and Type: today 09/29/21  covid + Next Office Visit and Type: none scheduled

## 2021-09-29 NOTE — Progress Notes (Signed)
Virtual Visit via Video Note  I connected with Carol Johnson on 09/29/21 at 12:00 PM EST by a video enabled telemedicine application and verified that I am speaking with the correct person using two identifiers.  Location: Patient: home Provider: office    I discussed the limitations of evaluation and management by telemedicine and the availability of in person appointments. The patient expressed understanding and agreed to proceed.  Parties involved in encounter  Patient: Carol Johnson  Provider:  Loura Pardon MD   History of Present Illness: Pt presents for covid 19 symptoms    She started with a scratchy throat on 1/28 and now cough with some wheezing Fever on Sunday - thinks it was quite high (chills Carol Johnson)  Covid test was positive yesterday   Cough is not as bad today  No fever today (since Monday)  Making improvement   Wheezing last night but not today  Ribs are sore today  Productive cough-rattling and not as deep /that is better  Some phlegm in throat- has not looked at it   Does not feel like she will need prednisone  Has nebulizer for albuterol   Some nasal congestion/not really bad  Throat feels dry /not sore   No sob  Headache on/off   Past med hx significant for asthma and cva   02 sat is 93% on RA   Lab Results  Component Value Date   CREATININE 0.70 07/02/2021   BUN 11 07/02/2021   NA 143 07/02/2021   K 3.5 07/02/2021   CL 107 07/02/2021   CO2 28 07/02/2021   Otc Tylenol  Mucinex  Drinking lots of fluids   Patient Active Problem List   Diagnosis Date Noted   Cerebrovascular accident ([place]) 01/12/2021   Chronic neck pain 01/12/2021   Gait abnormality 01/12/2021   Snores 01/12/2021   Vertigo 10/29/2020   Hearing loss 10/29/2020   Acute respiratory infection 09/19/2020   Hypercoagulable state ([place]) 12/18/2019   Hair loss 12/03/2019   Aneurysm of anterior cerebral artery 12/03/2019   Abnormality of gait 09/04/2019   Hemiparesis  affecting nondominant side as late effect of stroke ([place]) 07/24/2019   Muscle weakness (generalized) 06/11/2019   Hemiplegia and hemiparesis following cerebral infarction affecting left non-dominant side ([place]) 06/11/2019   Major depressive disorder    Hypokalemia    Acute blood loss anemia    Ulcerative colitis with complication ([place])    H/O: CVA (cerebrovascular accident) 05/23/2019   Thrombocytosis 05/23/2019   Coronary artery disease involving native coronary artery of native heart without angina pectoris 03/21/2018   Coronary artery calcification 02/10/2018   Obesity (BMI 30-39.9) 02/10/2018   Former smoker 01/15/2018   Dyspnea on exertion 12/21/2017   Carpal tunnel syndrome 12/21/2017   Stress incontinence 12/21/2017   Paresthesia of both feet 07/04/2017   Always thirsty 07/04/2017   Abdominal bloating 12/01/2015   Fatigue 12/01/2015   Vitamin B12 deficiency 12/01/2015   Lymphocytic colitis 01/16/2015   Anemia, iron deficiency 01/16/2015   Anxiety state 12/20/2014   Diarrhea 11/27/2014   Insomnia 11/27/2014   History of DVT (deep vein thrombosis) 09/17/2014   OSA (obstructive sleep apnea) 09/02/2014   Gastritis, chronic 06/06/2014   Unspecified gastritis and gastroduodenitis without mention of hemorrhage 05/03/2014   Abdominal pain, epigastric 04/19/2014   Internal hemorrhoids with other complication 86/57/8469   Routine general medical examination at a health care facility 10/14/2011   Thyroid nodule 05/16/2011   Rectal bleeding 11/24/2010   Hemorrhoids, internal 11/24/2010  Irritable bowel syndrome (IBS) 11/24/2010   ANEMIA 10/02/2009   Hyperlipidemia 07/18/2009   Essential hypertension 07/18/2009   DYSPNEA ON EXERTION 07/18/2009   THYROID CYST 01/31/2008   ANXIETY 01/31/2008   Depression with anxiety 01/31/2008   GASTRIC ULCER, HX OF 01/31/2008   FIBROIDS, UTERUS 11/13/2007   Allergic rhinitis 11/13/2007   Asthma 11/13/2007   GERD 11/13/2007   IRRITABLE BOWEL  SYNDROME 11/13/2007   INTERNAL HEMORRHOIDS 11/12/2003   Past Medical History:  Diagnosis Date   Abdominal pain, unspecified site    Allergic rhinitis    Anxiety    Asthma    Depression    DVT (deep venous thrombosis) ([place]) 2016   DVT of deep femoral vein, left (HCC)    Fibroid uterus    GERD (gastroesophageal reflux disease)    Hypercholesterolemia    Hypertension    Internal hemorrhoid    Irritable bowel syndrome    Lymphocytic colitis 01/02/2015   Pneumonia 1998   Stroke ([place]) 2020   left sided weekness   Ulcerative colitis ([place])    URI (upper respiratory infection)    Uterine fibroid    Vitamin B12 deficiency    Past Surgical History:  Procedure Laterality Date   CESAREAN SECTION  08/30/2002   CHOLECYSTECTOMY     laparoscopic "gallstones"   ESOPHAGOGASTRODUODENOSCOPY (EGD) WITH PROPOFOL N/A 05/03/2014   Procedure: ESOPHAGOGASTRODUODENOSCOPY (EGD) WITH PROPOFOL;  Surgeon: Carol Castle, MD;  Location: [facility];  Service: Endoscopy;  Laterality: N/A;   UTERINE FIBROID SURGERY     Social History   Tobacco Use   Smoking status: Former    Packs/day: 0.10    Years: 10.00    Pack years: 1.00    Types: Cigarettes    Quit date: 12/07/2017    Years since quitting: 3.8   Smokeless tobacco: Never  Vaping Use   Vaping Use: Never used  Substance Use Topics   Alcohol use: Yes    Alcohol/week: 0.0 standard drinks    Comment: rarely   Drug use: No   Family History  Problem Relation Age of Onset   Hyperlipidemia Mother    Mitral valve prolapse Mother    Heart disease Mother        arrhythmia   Pulmonary fibrosis Father    Colon cancer Paternal Aunt    Heart disease Paternal Aunt    Heart disease Paternal Uncle    Heart failure Maternal Grandmother    Uterine cancer Paternal Grandmother        with mets to the colon    Kidney disease Cousin    Esophageal cancer Neg Hx    Stomach cancer Neg Hx    Rectal cancer Neg Hx    Allergies  Allergen Reactions    Zoloft [Sertraline Hcl] Diarrhea   Current Outpatient Medications on File Prior to Visit  Medication Sig Dispense Refill   acetaminophen (TYLENOL) 325 MG tablet Take 2 tablets (650 mg total) by mouth every 4 (four) hours as needed for mild pain (or temp > 37.5 C (99.5 F)).     albuterol (PROVENTIL) (2.5 MG/3ML) 0.083% nebulizer solution Take 3 mLs (2.5 mg total) by nebulization every 6 (six) hours as needed for wheezing or shortness of breath. 150 mL 1   albuterol (VENTOLIN HFA) 108 (90 Base) MCG/ACT inhaler INHALE 2 PUFFS INTO THE LUNGS EVERY 4 (FOUR) HOURS AS NEEDED FOR WHEEZING, COUGH, OR SHORTNESS OF BREATH 6.7 each 3   atorvastatin (LIPITOR) 80 MG tablet TAKE 1 TABLET BY  MOUTH DAILY AT 6 PM. 90 tablet 1   buPROPion (WELLBUTRIN XL) 300 MG 24 hr tablet Take 1 tablet (300 mg total) by mouth daily. 90 tablet 3   citalopram (CELEXA) 20 MG tablet Take 1 tablet (20 mg total) by mouth daily. 90 tablet 3   fexofenadine (ALLEGRA) 180 MG tablet Take 180 mg by mouth daily as needed.      fluticasone (FLONASE) 50 MCG/ACT nasal spray Place 2 sprays into both nostrils daily. (Patient taking differently: Place 2 sprays into both nostrils daily as needed.) 16 g 6   loperamide (IMODIUM) 2 MG capsule Take 1 capsule (2 mg total) by mouth as needed for diarrhea or loose stools. 30 capsule 0   mesalamine (LIALDA) 1.2 g EC tablet TAKE 4 TABLETS BY MOUTH DAILY WITH BREAKFAST. 360 tablet 2   nitroGLYCERIN (NITRODUR - DOSED IN MG/24 HR) 0.2 mg/hr patch Apply 1/4 patch to affected area as directed by MD and change every 24 hours. 30 patch 2   pantoprazole (PROTONIX) 40 MG tablet TAKE 1 TABLET BY MOUTH EVERY DAY 90 tablet 0   vitamin B-12 (CYANOCOBALAMIN) 100 MCG tablet Take 1 tablet (100 mcg total) by mouth daily. 1 tablet    XARELTO 20 MG TABS tablet TAKE 1 TABLET (20 MG TOTAL) BY MOUTH DAILY WITH SUPPER. 90 tablet 3   DERMA-SMOOTHE/FS SCALP 0.01 % OIL Apply 1 a small amount to skin three times a week  Part hair, apply  to scalp, cover with plastic cap, wash out in the morning (Patient not taking: Reported on 09/29/2021)     No current facility-administered medications on file prior to visit.   Review of Systems  Constitutional:  Positive for malaise/fatigue. Negative for chills and fever.       Fever last night, not today  HENT:  Positive for congestion and sore throat. Negative for ear pain and sinus pain.   Eyes:  Negative for blurred vision, discharge and redness.  Respiratory:  Positive for cough, sputum production and wheezing. Negative for shortness of breath and stridor.   Cardiovascular:  Negative for chest pain, palpitations and leg swelling.  Gastrointestinal:  Negative for abdominal pain, diarrhea, nausea and vomiting.  Musculoskeletal:  Negative for myalgias.  Skin:  Negative for rash.  Neurological:  Positive for headaches. Negative for dizziness.   Observations/Objective: Patient appears well, in no distress Weight is baseline  No facial swelling or asymmetry Mildly hoarse with nasal voice  No obvious tremor or mobility impairment Moving neck and UEs normally Able to hear the call well  No cough or shortness of breath during interview (pt does occ clear throat) Talkative and mentally sharp with no cognitive changes No skin changes on face or neck , no rash or pallor Affect is normal    Assessment and Plan: Problem List Items Addressed This Visit       Other   COVID-19    Relatively mild today/improving  Elect to hold off on antiviral due to that  inst to call for prednisone if wheezing returns or worsens  Symptom control reviewed  ER precautions reviewed  Isolation protocol and masking reviewed   Update if not starting to improve in a week or if worsening          Follow Up Instructions: Drink fluids and rest  mucinex DM is good for cough and congestion  Nasal saline for congestion as needed  Tylenol for fever or pain or headache  Please alert [place] if symptoms worsen  (  if severe or short of breath please go to the ER)    Use albuterol treatment if needed  If worse wheeze let us know  I would hold off on anti viral medicine for now since you have improved a lot but if things worsen again please call   Isolate 5 days minimum or longer if still symptomatic Then mask for 10 days minimum    I discussed the assessment and treatment plan with the patient. The patient was provided an opportunity to ask questions and all were answered. The patient agreed with the plan and demonstrated an understanding of the instructions.   The patient was advised to call back or seek an in-person evaluation if the symptoms worsen or if the condition fails to improve as anticipated.     Carol Pardon, MD

## 2021-09-29 NOTE — Patient Instructions (Signed)
Drink fluids and rest  mucinex DM is good for cough and congestion  Nasal saline for congestion as needed  Tylenol for fever or pain or headache  Please alert [place] if symptoms worsen (if severe or short of breath please go to the ER)    Use albuterol treatment if needed  If worse wheeze let us know  I would hold off on anti viral medicine for now since you have improved a lot but if things worsen again please call   Isolate 5 days minimum or longer if still symptomatic Then mask for 10 days minimum

## 2021-09-29 NOTE — Telephone Encounter (Signed)
I spoke with pt; pt already has VV with Dr Glori Bickers scheduled for today at 12 noon.pt started with scratchy throat on 09/26/21 and now has cough and at times wheezing.+ covid test on 09/28/21. Pt said she was not having any difficulty in breathing now, no CP,SOB,H/A, or dizziness. Pt said she does not think she needs to go to [facility] or ED. Pt wants to keep her appt with Dr Glori Bickers today at 12 noon. UC & ED precautions given and pt voiced understanding. Sending note to Dr Glori Bickers and Rexene Agent CMA and will teams [place] also.

## 2021-09-29 NOTE — Telephone Encounter (Signed)
Agree with ER precautions  Will see her then

## 2021-09-29 NOTE — Telephone Encounter (Signed)
[place] Night - Client TELEPHONE ADVICE RECORD [facility] Patient Name: Carol Johnson ETT Gender: Female DOB: July 29, 1966 Age: 56 Y 11 M 23 D Return Phone Number: [phone] (Primary) Address: [place] City/ State/ Zip: [place]  [address] Client [place] Night - Client Client Site [place] Provider [facility], Roque Lias - MD Contact Type Call Who Is Calling Patient / Member / Family / Caregiver Call Type Triage / Clinical Relationship To Patient Self Return Phone Number [phone] (Primary) Chief Complaint BREATHING - fast, heavy or wheezing Reason for Call Symptomatic / Request for [place] states she has been sick since Saturday and positive covid19 test. She has asthma and needing to know what to do? Translation No Nurse Assessment Nurse: Tamala Julian, RN, Katie Date/Time [name] Time): 09/28/2021 10:12:42 PM Confirm and document reason for call. If symptomatic, describe symptoms. ---Caller states she has had a scratchy throat since Saturday and now has coughing and wheezing. Tested positive for Covid today. Does the patient have any new or worsening symptoms? ---Yes Will a triage be completed? ---Yes Related visit to physician within the last 2 weeks? ---No Does the PT have any chronic conditions? (i.e. diabetes, asthma, this includes High risk factors for pregnancy, etc.) ---Yes List chronic conditions. ---asthma, hx of stroke (on blood thinner), hyperlipidemia, UC, GERD, obesity Is this a behavioral health or substance abuse call? ---No Guidelines Guideline Title Affirmed Question Affirmed Notes Nurse Date/Time (Eastern Time) COVID-19 - Diagnosed or Suspected [1] HIGH RISK for severe COVID complications (e.g., weak immune system, age > 31 years, obesity with BMI 30 or higher, pregnant, chronic lung disease or other [name] 09/28/2021 10:14:54 PM PLEASE NOTE: All timestamps contained within this report are represented as [place] Standard Time. CONFIDENTIALTY NOTICE: This fax transmission is intended only for the addressee. It contains information that is legally privileged, confidential or otherwise protected from use or disclosure. If you are not the intended recipient, you are strictly prohibited from reviewing, disclosing, copying using or disseminating any of this information or taking any action in reliance on or regarding this information. If you have received this fax in error, please notify us immediately by telephone so that we can arrange for its return to [place]. Phone: [phone], Toll-Free: [phone], Fax: [phone] Page: 2 of 2 Call Id: [id number] Guidelines Guideline Title Affirmed Question Affirmed Notes Nurse Date/Time [name] Time) chronic medical condition) AND [2] COVID symptoms (e.g., cough, fever) (Exceptions: Already seen by PCP and no new or worsening symptoms.) Disp. Time [name] Time) Disposition Final User 09/28/2021 10:11:27 PM Send to Urgent Skip Estimable 09/28/2021 10:23:41 PM Call PCP within 24 Hours Yes Tamala Julian, RN, Joellen [place] Caller Disagree/Comply Comply Caller Understands Yes PreDisposition Call Doctor Care Advice Given Per Guideline CALL PCP WITHIN 24 HOURS: * You need to discuss this with your doctor (or NP/PA) within the next 24 hours. GENERAL CARE ADVICE FOR COVID-19 SYMPTOMS: * The symptoms are generally treated the same whether you have COVID-19, influenza or some other respiratory virus. * Cough: Use cough drops. * IF OFFICE WILL BE OPEN: Call the office when it opens tomorrow morning. * Feeling dehydrated: Drink extra liquids. If the air in your home is dry, use a humidifier. * Fever: For fever over 101 F (38.3 C), take acetaminophen every 4 to 6 hours (Adults 650 mg) OR ibuprofen every 6 to 8 hours (Adults 400 mg). Before taking any  medicine, read  all the instructions on the package. Do not take aspirin unless your doctor has prescribed it for you. * Muscle aches, headache, and other pains: Often this comes and goes with the fever. Take acetaminophen every 4 to 6 hours (Adults 650 mg) OR ibuprofen every 6 to 8 hours (Adults 400 mg). Before taking any medicine, read all the instructions on the package. * Sore throat: Try throat lozenges, hard candy or warm chicken broth. * COUGH DROPS: Over-the-counter cough drops can help a lot, especially for mild coughs. They soothe an irritated throat and remove the tickle sensation in the back of the throat. Cough drops are easy to carry with you. * COUGH SYRUP WITH DEXTROMETHORPHAN: An over-the-counter cough syrup can help your cough. The most common cough suppressant in over-the-counter cough medicines is dextromethorphan. * HOME REMEDY - HARD CANDY: Hard candy works just as well as over-the-counter cough drops. People who have diabetes should use sugar-free candy. * HOME REMEDY - HONEY: This old home remedy has been shown to help decrease coughing at night. The adult dosage is 2 teaspoons (10 ml) at bedtime. COUGH SYRUP WITH DEXTROMETHORPHAN: FEVER MEDICINES: * For fevers above 101 F (38.3 C) take either acetaminophen or ibuprofen. * They are over-the-counter (OTC) drugs that help treat both fever and pain. You can buy them at the drugstore. CALL BACK IF: * You become worse CARE ADVICE given per COVID-19 - DIAGNOSED OR SUSPECTED (Adult) guideline. Referrals REFERRED TO PCP OFFIC

## 2021-09-29 NOTE — Assessment & Plan Note (Signed)
Relatively mild today/improving  Elect to hold off on antiviral due to that  inst to call for prednisone if wheezing returns or worsens  Symptom control reviewed  ER precautions reviewed  Isolation protocol and masking reviewed   Update if not starting to improve in a week or if worsening

## 2021-10-03 ENCOUNTER — Other Ambulatory Visit: Payer: Self-pay | Admitting: Gastroenterology

## 2021-10-19 ENCOUNTER — Ambulatory Visit
Admission: RE | Admit: 2021-10-19 | Discharge: 2021-10-19 | Disposition: A | Discharge status: Home / Self Care | Payer: No Typology Code available for payment source | Source: Ambulatory Visit | Attending: Family Medicine | Admitting: Family Medicine

## 2021-10-19 ENCOUNTER — Inpatient Hospital Stay: Admission: RE | Admit: 2021-10-19 | Payer: No Typology Code available for payment source | Source: Ambulatory Visit

## 2021-10-19 ENCOUNTER — Other Ambulatory Visit: Payer: Self-pay

## 2021-10-19 DIAGNOSIS — M84375A Stress fracture, left foot, initial encounter for fracture: Secondary | ICD-10-CM

## 2021-10-19 DIAGNOSIS — G8929 Other chronic pain: Secondary | ICD-10-CM

## 2021-10-28 ENCOUNTER — Emergency Department (HOSPITAL_COMMUNITY)
Admission: EM | Admit: 2021-10-28 | Discharge: 2021-10-29 | Disposition: A | Discharge status: Home / Self Care | Payer: No Typology Code available for payment source | Attending: Emergency Medicine | Admitting: Emergency Medicine

## 2021-10-28 ENCOUNTER — Other Ambulatory Visit: Payer: Self-pay | Admitting: Family Medicine

## 2021-10-28 ENCOUNTER — Other Ambulatory Visit: Payer: Self-pay

## 2021-10-28 DIAGNOSIS — Z0000 Encounter for general adult medical examination without abnormal findings: Secondary | ICD-10-CM | POA: Insufficient documentation

## 2021-10-28 DIAGNOSIS — Z7901 Long term (current) use of anticoagulants: Secondary | ICD-10-CM | POA: Diagnosis not present

## 2021-10-28 DIAGNOSIS — B9689 Other specified bacterial agents as the cause of diseases classified elsewhere: Secondary | ICD-10-CM | POA: Insufficient documentation

## 2021-10-28 DIAGNOSIS — M79604 Pain in right leg: Secondary | ICD-10-CM

## 2021-10-28 DIAGNOSIS — D72829 Elevated white blood cell count, unspecified: Secondary | ICD-10-CM | POA: Insufficient documentation

## 2021-10-28 DIAGNOSIS — L03115 Cellulitis of right lower limb: Secondary | ICD-10-CM

## 2021-10-28 LAB — CBC WITH DIFFERENTIAL/PLATELET
Abs Immature Granulocytes: 0.08 10*3/uL — ABNORMAL HIGH (ref 0.00–0.07)
Basophils Absolute: 0.1 10*3/uL (ref 0.0–0.1)
Basophils Relative: 1 %
Eosinophils Absolute: 1.3 10*3/uL — ABNORMAL HIGH (ref 0.0–0.5)
Eosinophils Relative: 10 %
HCT: 38.1 % (ref 36.0–46.0)
Hemoglobin: 11.9 g/dL — ABNORMAL LOW (ref 12.0–15.0)
Immature Granulocytes: 1 %
Lymphocytes Relative: 17 %
Lymphs Abs: 2.3 10*3/uL (ref 0.7–4.0)
MCH: 24.6 pg — ABNORMAL LOW (ref 26.0–34.0)
MCHC: 31.2 g/dL (ref 30.0–36.0)
MCV: 78.7 fL — ABNORMAL LOW (ref 80.0–100.0)
Monocytes Absolute: 0.6 10*3/uL (ref 0.1–1.0)
Monocytes Relative: 4 %
Neutro Abs: 9.2 10*3/uL — ABNORMAL HIGH (ref 1.7–7.7)
Neutrophils Relative %: 67 %
Platelets: 410 10*3/uL — ABNORMAL HIGH (ref 150–400)
RBC: 4.84 MIL/uL (ref 3.87–5.11)
RDW: 15.3 % (ref 11.5–15.5)
WBC: 13.5 10*3/uL — ABNORMAL HIGH (ref 4.0–10.5)
nRBC: 0 % (ref 0.0–0.2)

## 2021-10-28 LAB — BASIC METABOLIC PANEL
Anion gap: 8 (ref 5–15)
BUN: 6 mg/dL (ref 6–20)
CO2: 26 mmol/L (ref 22–32)
Calcium: 9.3 mg/dL (ref 8.9–10.3)
Chloride: 107 mmol/L (ref 98–111)
Creatinine, Ser: 0.93 mg/dL (ref 0.44–1.00)
GFR, Estimated: >60 mL/min (ref >60)
Glucose, Bld: 109 mg/dL — ABNORMAL HIGH (ref 70–99)
Potassium: 3.2 mmol/L — ABNORMAL LOW (ref 3.5–5.1)
Sodium: 141 mmol/L (ref 135–145)

## 2021-10-28 NOTE — Telephone Encounter (Signed)
Please refill once and schedule PE ?

## 2021-10-28 NOTE — Telephone Encounter (Signed)
Med refilled once and will route to support pool to schedule appts (CPE with labs prior if possible) ?

## 2021-10-28 NOTE — ED Provider Triage Note (Signed)
Emergency Medicine Provider Triage Evaluation Note ? ?Carol Johnson , a 56 y.o. female  was evaluated in triage.  Pt complains of right leg pain beginning today.  Patient has history of blood clots, states she is on Xarelto.  Patient states that the area above her knee feels warm, tender to touch.  Patient recently had COVID 4 weeks ago. ? ?Review of Systems  ?Positive: Tender right leg ?Negative: Chest pain, shortness of breath, headache, fevers ? ?Physical Exam  ?BP (!) 171/91 (BP Location: Left Arm)   Pulse 95   Temp 98.1 ?F (36.7 ?C)   Resp 20   LMP 11/29/2015   SpO2 99%  ?Gen:   Awake, no distress   ?Resp:  Normal effort  ?MSK:   Moves extremities without difficulty  ?Other:   ? ?Medical Decision Making  ?Medically screening exam initiated at 8:39 PM.  Appropriate orders placed.  Carol Johnson was informed that the remainder of the evaluation will be completed by another provider, this initial triage assessment does not replace that evaluation, and the importance of remaining in the ED until their evaluation is complete. ? ? ?  ?Azucena Cecil, PA-C ?10/28/21 2040 ? ?

## 2021-10-28 NOTE — Telephone Encounter (Signed)
Pt has had multiple acute appts with other providers but no recent or future f/u or CPE appts and no recent labs done by PCP, please advise  ?

## 2021-10-28 NOTE — ED Triage Notes (Signed)
Pt here w/ R upper leg pain since yesterday. Pt has hx of DVT in L leg and is on blood thinners. Pt had covid dx approx 4 weeks ago. ?

## 2021-10-29 ENCOUNTER — Encounter: Payer: Self-pay | Admitting: Family Medicine

## 2021-10-29 ENCOUNTER — Other Ambulatory Visit (HOSPITAL_COMMUNITY): Payer: Self-pay

## 2021-10-29 ENCOUNTER — Emergency Department (HOSPITAL_COMMUNITY): Payer: No Typology Code available for payment source

## 2021-10-29 ENCOUNTER — Emergency Department (HOSPITAL_BASED_OUTPATIENT_CLINIC_OR_DEPARTMENT_OTHER): Payer: No Typology Code available for payment source

## 2021-10-29 DIAGNOSIS — M79604 Pain in right leg: Secondary | ICD-10-CM | POA: Diagnosis not present

## 2021-10-29 DIAGNOSIS — B9689 Other specified bacterial agents as the cause of diseases classified elsewhere: Secondary | ICD-10-CM | POA: Insufficient documentation

## 2021-10-29 DIAGNOSIS — L089 Local infection of the skin and subcutaneous tissue, unspecified: Secondary | ICD-10-CM | POA: Insufficient documentation

## 2021-10-29 MED ORDER — DEXTROSE 5 % IV SOLN
1500.0000 mg | Freq: Once | INTRAVENOUS | Status: AC
  Administered 2021-10-29: 1500 mg via INTRAVENOUS
  Filled 2021-10-29: qty 75

## 2021-10-29 NOTE — ED Provider Notes (Signed)
?[facility] DEPT ?[facility] Emergency Department ?Provider Note ?MRN:  [id number]  ?Arrival date & time: 10/29/21    ? ?Chief Complaint   ?Leg Pain ?  ?History of Present Illness   ?[name] is a 56 y.o. year-old female presents to the ED with chief complaint of right leg pain.  Concerned about DVT.  Has history of the same.  Is anticoagulated on Xarelto.  Reports increased warmth to the upper leg.  Denies fever.  Denies trauma. ? ?History provided by patient. ? ?Review of Systems  ?Pertinent review of systems noted in HPI.  ? ? ?Physical Exam  ? ?Vitals:  ? 10/28/21 2331 10/29/21 0146  ?BP: (!) 153/90 (!) 158/96  ?Pulse: 77 78  ?Resp: 18 14  ?Temp:  97.8 ?F (36.6 ?C)  ?SpO2: 98% 97%  ?  ?CONSTITUTIONAL:  well-appearing, NAD ?NEURO:  Alert and oriented x 3, CN 3-12 grossly intact ?EYES:  eyes equal and reactive ?ENT/NECK:  Supple, no stridor  ?CARDIO:  normal rate, regular rhythm, appears well-perfused  ?PULM:  No respiratory distress,  ?GI/GU:  non-distended,  ?MSK/SPINE:  No gross deformities, no edema, moves all extremities, able to range knee without much difficulty ?SKIN:  no rash, atraumatic, skin is warm with mild developing erythema to the superior anterior right knee, but without palpable effusion. ? ? ?*Additional and/or pertinent findings included in MDM below ? ?Diagnostic and Interventional Summary  ? ? EKG Interpretation ? ?Date/Time:    ?Ventricular Rate:    ?PR Interval:    ?QRS Duration:   ?QT Interval:    ?QTC Calculation:   ?R Axis:     ?Text Interpretation:   ?  ? ?  ? ?Labs Reviewed  ?CBC WITH DIFFERENTIAL/PLATELET - Abnormal; Notable for the following components:  ?    Result Value  ? WBC 13.5 (*)   ? Hemoglobin 11.9 (*)   ? MCV 78.7 (*)   ? MCH 24.6 (*)   ? Platelets 410 (*)   ? Neutro Abs 9.2 (*)   ? Eosinophils Absolute 1.3 (*)   ? Abs Immature Granulocytes 0.08 (*)   ? All other components within normal limits  ?BASIC METABOLIC PANEL - Abnormal; Notable for the following  components:  ? Potassium 3.2 (*)   ? Glucose, Bld 109 (*)   ? All other components within normal limits  ?  ?DG Knee Complete 4 Views Right  ?Final Result  ?  ?VAS [place] LOWER EXTREMITY VENOUS (DVT) (ONLY [facility] & [facility])    (Results Pending)  ?  ?Medications  ?dalbavancin (DALVANCE) 1,500 mg in dextrose 5 % 500 mL IVPB (has no administration in time range)  ?  ? ?Procedures  /  Critical Care ?Procedures ? ?ED Course and Medical Decision Making  ?I have reviewed the triage vital signs, the nursing notes, and pertinent available records from the EMR. ? ?Complexity of Problems Addressed: ?Moderate Complexity: Acute complicated illness or injury, requiring diagnostic workup as ordered and performed below. ? ?ED Course: ?After considering the following differential, DVT, cellulitis, bursitis, I ordered Dalvance to treat potential developing cellulitis.  I added R knee XR and agree with plan for R DVT study. ?I personally interpreted the labs which are notable for mild leukocytosis ?I visualized the knee x-ray which is notable for degenerative changes and trace effusion and agree with the radiologist interpretation.. ? ?  ? ?Social Determinants Affecting Care: ? ? ? ? ?Patient signed out to oncoming team, who will continue care. ? ?  Final Clinical Impressions(s) / ED Diagnoses  ? ?  ICD-10-CM   ?1. Routine general medical examination at a health care facility  Z00.00 Ambulatory referral to Infectious Disease  ? s/p Dalvance  ?  ?  ?ED Discharge Orders   ? ?      Ordered  ?  Ambulatory referral to Infectious Disease       ?Comments: Cellulitis patient:  Received dalbavancin on 10/29/2021.  ? 10/29/21 0310  ? ?  ?  ? ?  ?  ? ? ?Discharge Instructions Discussed with and Provided to Patient:  ? ?Discharge Instructions   ?None ?  ? ?  ?Montine Circle, PA-C ?10/29/21 2201 ? ?  ?Merrily Pew, MD ?10/29/21 2259 ? ?

## 2021-10-29 NOTE — ED Notes (Signed)
Pt verbalized understanding of d/c instructions, meds and followup care. Denies questions. VSS, no distress noted. Steady gait to exit with all belongings.  ?

## 2021-10-29 NOTE — Discharge Instructions (Addendum)
Your work-up today did not show any evidence of DVT with the ultrasound study.  X-ray did not show any acute findings.  You were given an antibiotic to treat skin infection.  Your symptoms are improved compared to when you came in.  Because you received the IV antibiotic in the emergency room.  This is a one-time dose.  You do not require additional antibiotics.  Return to the emergency room for any new or worsening symptoms.  Follow-up with your primary care provider otherwise as you need to. ?

## 2021-10-29 NOTE — Progress Notes (Signed)
Pharmacy Note:  Dalbavancin for Acute Bacterial Skin and Skin Structure Infection (ABSSSI) Patients to [facility] Discharge ?Carol Johnson is an 56 y.o. female who presented to [facility] on 10/28/2021 with an Acute Bacterial Skin and Skin Structure Infection ? ?Inclusion criteria - Indication ?[]  Moderately large skin lesion (>=75 cm2 or larger - about the size of a baseball) ?[x]  Cellulitis ? ?Inclusion Criteria - at least one SIRS criteria present ?[x]  WBC > 12,000 or < 4000 []  temp >100.9 or < 96.8 []  heart rate >90[]  respiratory rate >20 ? ?Patient was evaluated for the following exclusion criteria and no exclusions were found ? ?Hardware involvement, Hypotension / shock, Elevated lactate (>2) without other explanation, ram-negative infection risk factors (bites, water exposure, infection after trauma, infection after skin graft, neutropenia, burns, severe immunocompromise), necrotizing fasciitis possible or confirmed, Known or suspected osteomyelitis or septic arthritis, endocarditis, diabetic foot infection, ischemic ulcers, post-operative wound infection, perirectal infections, need for drainage in the operating room, hand or facial infections, injection drug users with a fever, bacteremia, pregnancy or breastfeeding, allergy to related antibiotics like vancomycin, known liver disease (t.bili >2x ULN or AST/ALT 3x ULN) ? ?Wynona Neat, PharmD, BCPS  ?10/29/2021, 3:16 AM ?Clinical Pharmacist ? ?

## 2021-10-29 NOTE — ED Provider Notes (Addendum)
Signout received on this 56 year old female who presents today for evaluation of right leg pain.  She is anticoagulated on Xarelto.  She is concerned about DVT given her history of DVTs.  She received Dalvance while in the emergency room.  At the time of signout she is waiting DVT study. ?Evaluation of patient at bedside.  Patient reports her symptoms are much better than arrival.  She is able to flex her knee to about 60 degrees which she states is much better than when she came in.  Denies chest pain, shortness of breath, or palpitations. ? ?Physical Exam  ?BP (!) 148/84 (BP Location: Right Arm)   Pulse 75   Temp 97.8 ?F (36.6 ?C) (Oral)   Resp 16   LMP 11/29/2015   SpO2 99%  ? ? ? ?Procedures  ?Procedures ? ?ED Course / MDM  ?  ?Medical Decision Making ?Amount and/or Complexity of Data Reviewed ?Radiology: ordered. ? ? ?DVT study negative.  Patient remains without worsening symptoms.  Reports improvement in symptoms since arrival.  Patient is appropriate for discharge.  Discharged in stable condition. ? ?Patient has notation in her chart that she has a legal guardian.  Unable to find documentation message asked who the legal guardian is.  Per review by nurse this notation was put in back in 2020 but does not qualify why.  Patient states she does not have a legal guardian.  Review of recent visit to her PCP does not make note of a legal guardian either. ? ? ? ?  ?Evlyn Courier, PA-C ?10/29/21 1028 ? ?  ?Evlyn Courier, PA-C ?10/29/21 1042 ? ?  ?Pattricia Boss, MD ?10/29/21 1533 ? ?

## 2021-10-29 NOTE — Progress Notes (Signed)
VASCULAR LAB ? ? ? ?Right lower extremity venous duplex has been performed. ? ?See CV proc for preliminary results. ? ?Messaged results to Evlyn Courier, PA-C via secure chat ? ?Lylla Eifler, RVT ?10/29/2021, 8:56 AM ? ?

## 2021-10-30 NOTE — Telephone Encounter (Signed)
Pt notified of Dr. Marliss Coots comments and instructions and verbalized understanding. Er precautions given and f/u appt with PCP scheduled  ?

## 2021-10-30 NOTE — Telephone Encounter (Signed)
Patient called back to follow up on message would like call back before the end of the day.  ?

## 2021-11-02 ENCOUNTER — Ambulatory Visit: Payer: No Typology Code available for payment source | Admitting: Family Medicine

## 2021-11-02 ENCOUNTER — Other Ambulatory Visit: Payer: Self-pay

## 2021-11-02 ENCOUNTER — Encounter: Payer: Self-pay | Admitting: Family Medicine

## 2021-11-02 VITALS — BP 140/80 | HR 70 | Temp 97.9°F | Resp 20 | Ht 64.0 in | Wt 226.0 lb

## 2021-11-02 DIAGNOSIS — Z889 Allergy status to unspecified drugs, medicaments and biological substances status: Secondary | ICD-10-CM | POA: Diagnosis not present

## 2021-11-02 DIAGNOSIS — L03115 Cellulitis of right lower limb: Secondary | ICD-10-CM | POA: Diagnosis not present

## 2021-11-02 DIAGNOSIS — L0390 Cellulitis, unspecified: Secondary | ICD-10-CM | POA: Insufficient documentation

## 2021-11-02 NOTE — Progress Notes (Signed)
Subjective:    Patient ID: Carol Johnson, female    DOB: 11/22/1965, 56 y.o.   MRN: [id number]  This visit occurred during the SARS-CoV-2 public health emergency.  Safety protocols were in place, including screening questions prior to the visit, additional usage of staff PPE, and extensive cleaning of exam room while observing appropriate contact time as indicated for disinfecting solutions.   HPI Pt presents for f/u of cellulitis   Wt Readings from Last 3 Encounters:  11/02/21 226 lb (102.5 kg)  09/07/21 237 lb 2 oz (107.6 kg)  08/03/21 229 lb (103.9 kg)   38.79 kg/m    She was dx with cellulitis in ER on 10/28/21 and treated with Dalvance (lipoglycopeptide) Her bp was up then  Also had venous doppler on RLE that r/u PE  Then had a reaction to the abx  (Dalvance)  Called 911 Vitals were ok  Had facial flushing and some whelps Took benadryl   Flushed and still itching on chest and back  Some bumps   Now face is improved but feels scabbed and chafed and rough     BP Readings from Last 3 Encounters:  11/02/21 140/80  10/29/21 (!) 150/90  09/07/21 120/80   Pulse Readings from Last 3 Encounters:  11/02/21 70  10/29/21 80  09/29/21 71      MR HEEL LEFT WO CONTRAST  Result Date: 10/20/2021 CLINICAL DATA:  Chronic heel pain. EXAM: MR OF THE LEFT HEEL WITHOUT CONTRAST TECHNIQUE: Multiplanar, multisequence MR imaging of the left heel was performed. No intravenous contrast was administered. COMPARISON:  None. FINDINGS: TENDONS Peroneal: Mild tendinosis of the peroneus brevis. Moderate tendinosis of the peroneus longus. Posteromedial: Posterior tibial tendon intact. Flexor hallucis longus tendon intact. Flexor digitorum longus tendon intact. Anterior: Tibialis anterior tendon intact. Extensor hallucis longus tendon intact Extensor digitorum longus tendon intact. Achilles: Moderate tendinosis of the distal Achilles tendon with enthesopathic changes at the Achilles tendon  insertion. Plantar Fascia: Intact. Small plantar calcaneal spur. LIGAMENTS Lateral: Anterior talofibular ligament intact. Calcaneofibular ligament intact. Posterior talofibular ligament intact. Anterior and posterior tibiofibular ligaments intact. Medial: Deltoid ligament intact. Spring ligament intact. CARTILAGE Ankle Joint: No joint effusion. Normal ankle mortise. No chondral defect. Subtalar Joints/Sinus Tarsi: Normal subtalar joints. No subtalar joint effusion. Normal sinus tarsi. Bones: No marrow signal abnormality.  No fracture or dislocation. Soft Tissue: No fluid collection or hematoma. Muscles are normal without edema or atrophy. Tarsal tunnel is normal. 1.9 x 1.9 x 7.1 cm cystic mass along the lateral margin of the talonavicular joint coursing medially deep to the extensor compartment tendons consistent with a ganglion cyst. IMPRESSION: 1. Moderate tendinosis of the distal Achilles tendon with enthesopathic changes at the Achilles tendon insertion. 2. Mild tendinosis of the peroneus brevis. 3. Moderate tendinosis of the peroneus longus. 4. 1.9 x 1.9 x 7.1 cm ganglion cyst along the lateral margin of the talonavicular joint coursing medially deep to the extensor compartment tendons. Electronically Signed   By: Kathreen Devoid M.D.   On: 10/20/2021 14:19   DG Knee Complete 4 Views Right  Result Date: 10/29/2021 CLINICAL DATA:  Right knee pain EXAM: RIGHT KNEE - COMPLETE 4+ VIEW COMPARISON:  None. FINDINGS: Normal alignment. No acute fracture or dislocation. Medial and lateral compartment joint spaces are preserved. Minimal patellofemoral degenerative arthritis with tiny osteophyte formation. Trace effusion. Mild degenerative enthesopathy involving the insertion of the quadriceps tendon upon the patella. IMPRESSION: Mild degenerative change.  Trace effusion. Electronically Signed   By:  Fidela Salisbury M.D.   On: 10/29/2021 03:29   VAS [place] LOWER EXTREMITY VENOUS (DVT) (ONLY [facility] & [facility])  Result Date: 10/29/2021   Lower Venous DVT Study Patient Name:  Carol Johnson  Date of Exam:   10/29/2021 Medical Rec #: [id number]       Accession #:    [id number] Date of Birth: 1966/04/08        Patient Gender: F Patient Age:   60 years Exam Location:  [facility] Procedure:      VAS [place] LOWER EXTREMITY VENOUS (DVT) Referring Phys: Genevive Bi --------------------------------------------------------------------------------  Indications: Pain.  Risk Factors: DVT History of left lower extremity DVT. Anticoagulation: Xarelto. Limitations: Unable to tolerate compression. Comparison Study: Prior bilateral LEV done 11/13/2019 indicated a left, non                   occlusive DVT in the mid femoral vein Performing Technologist: Sharion Dove RVS  Examination Guidelines: A complete evaluation includes B-mode imaging, spectral Doppler, color Doppler, and power Doppler as needed of all accessible portions of each vessel. Bilateral testing is considered an integral part of a complete examination. Limited examinations for reoccurring indications may be performed as noted. The reflux portion of the exam is performed with the patient in reverse Trendelenburg.  +---------+---------------+---------+-----------+---------------+--------------+  RIGHT     Compressibility Phasicity Spontaneity Properties      Thrombus Aging  +---------+---------------+---------+-----------+---------------+--------------+  CFV                       Yes       Yes         patent by color patent by                                                        and Doppler     color and                                                                        Doppler         +---------+---------------+---------+-----------+---------------+--------------+  FV Prox                   Yes       Yes         patent by color                                                                  and Doppler                      +---------+---------------+---------+-----------+---------------+--------------+  FV Mid    Full                                                                  +---------+---------------+---------+-----------+---------------+--------------+  FV Distal                 Yes       Yes         patent by color                                                                  and Doppler                     +---------+---------------+---------+-----------+---------------+--------------+  PFV                       Yes       Yes         patent by color                                                                  and Doppler                     +---------+---------------+---------+-----------+---------------+--------------+  POP       Full            Yes       Yes         patent by color                                                                  and Doppler                     +---------+---------------+---------+-----------+---------------+--------------+  PTV       Full                                                                  +---------+---------------+---------+-----------+---------------+--------------+  PERO      Full                                                                  +---------+---------------+---------+-----------+---------------+--------------+   +----+---------------+---------+-----------+--------------------+--------------+  LEFT Compressibility Phasicity Spontaneity Properties           Thrombus Aging  +----+---------------+---------+-----------+--------------------+--------------+  CFV                  Yes       Yes         patent by color and  Doppler                              +----+---------------+---------+-----------+--------------------+--------------+     *See table(s) above for measurements and observations. Electronically signed by Jamelle Haring on 10/29/2021 at 3:44:33 PM.    Final     Lab Results  Component  Value Date   CREATININE 0.93 10/28/2021   BUN 6 10/28/2021   NA 141 10/28/2021   K 3.2 (L) 10/28/2021   CL 107 10/28/2021   CO2 26 10/28/2021   Lab Results  Component Value Date   WBC 13.5 (H) 10/28/2021   HGB 11.9 (L) 10/28/2021   HCT 38.1 10/28/2021   MCV 78.7 (L) 10/28/2021   PLT 410 (H) 10/28/2021   Patient Active Problem List   Diagnosis Date Noted   Bacterial skin infection 10/29/2021   COVID-19 09/29/2021   Cerebrovascular accident ([place]) 01/12/2021   Chronic neck pain 01/12/2021   Gait abnormality 01/12/2021   Snores 01/12/2021   Vertigo 10/29/2020   Hearing loss 10/29/2020   Acute respiratory infection 09/19/2020   Hypercoagulable state ([place]) 12/18/2019   Hair loss 12/03/2019   Aneurysm of anterior cerebral artery 12/03/2019   Abnormality of gait 09/04/2019   Hemiparesis affecting nondominant side as late effect of stroke ([place]) 07/24/2019   Muscle weakness (generalized) 06/11/2019   Hemiplegia and hemiparesis following cerebral infarction affecting left non-dominant side ([place]) 06/11/2019   Major depressive disorder    Hypokalemia    Acute blood loss anemia    Ulcerative colitis with complication ([place])    H/O: CVA (cerebrovascular accident) 05/23/2019   Thrombocytosis 05/23/2019   Coronary artery disease involving native coronary artery of native heart without angina pectoris 03/21/2018   Coronary artery calcification 02/10/2018   Obesity (BMI 30-39.9) 02/10/2018   Former smoker 01/15/2018   Dyspnea on exertion 12/21/2017   Carpal tunnel syndrome 12/21/2017   Stress incontinence 12/21/2017   Paresthesia of both feet 07/04/2017   Always thirsty 07/04/2017   Abdominal bloating 12/01/2015   Fatigue 12/01/2015   Vitamin B12 deficiency 12/01/2015   Lymphocytic colitis 01/16/2015   Anemia, iron deficiency 01/16/2015   Anxiety state 12/20/2014   Diarrhea 11/27/2014   Insomnia 11/27/2014   History of DVT (deep vein thrombosis) 09/17/2014   OSA (obstructive  sleep apnea) 09/02/2014   Gastritis, chronic 06/06/2014   Unspecified gastritis and gastroduodenitis without mention of hemorrhage 05/03/2014   Abdominal pain, epigastric 04/19/2014   Internal hemorrhoids with other complication 16/38/4536   Routine general medical examination at a health care facility 10/14/2011   Thyroid nodule 05/16/2011   Rectal bleeding 11/24/2010   Hemorrhoids, internal 11/24/2010   Irritable bowel syndrome (IBS) 11/24/2010   ANEMIA 10/02/2009   Hyperlipidemia 07/18/2009   Essential hypertension 07/18/2009   DYSPNEA ON EXERTION 07/18/2009   THYROID CYST 01/31/2008   ANXIETY 01/31/2008   Depression with anxiety 01/31/2008   GASTRIC ULCER, HX OF 01/31/2008   FIBROIDS, UTERUS 11/13/2007   Allergic rhinitis 11/13/2007   Asthma 11/13/2007   GERD 11/13/2007   IRRITABLE BOWEL SYNDROME 11/13/2007   INTERNAL HEMORRHOIDS 11/12/2003   Past Medical History:  Diagnosis Date   Abdominal pain, unspecified site    Allergic rhinitis    Anxiety    Asthma    Depression    DVT (deep venous thrombosis) ([place]) 2016   DVT of deep femoral vein, left ([facility])    Fibroid uterus    GERD (gastroesophageal reflux disease)  Hypercholesterolemia    Hypertension    Internal hemorrhoid    Irritable bowel syndrome    Lymphocytic colitis 01/02/2015   Pneumonia 1998   Stroke [facility]) 2020   left sided weekness   Ulcerative colitis ([place])    URI (upper respiratory infection)    Uterine fibroid    Vitamin B12 deficiency    Past Surgical History:  Procedure Laterality Date   CESAREAN SECTION  08/30/2002   CHOLECYSTECTOMY     laparoscopic "gallstones"   ESOPHAGOGASTRODUODENOSCOPY (EGD) WITH PROPOFOL N/A 05/03/2014   Procedure: ESOPHAGOGASTRODUODENOSCOPY (EGD) WITH PROPOFOL;  Surgeon: Inda Castle, MD;  Location: [facility] END[facility];  Service: Endoscopy;  Laterality: N/A;   UTERINE FIBROID SURGERY     Social History   Tobacco Use   Smoking status: Former    Packs/day: 0.10     Years: 10.00    Pack years: 1.00    Types: Cigarettes    Quit date: 12/07/2017    Years since quitting: 3.9   Smokeless tobacco: Never  Vaping Use   Vaping Use: Never used  Substance Use Topics   Alcohol use: Yes    Alcohol/week: 0.0 standard drinks    Comment: rarely   Drug use: No   Family History  Problem Relation Age of Onset   Hyperlipidemia Mother    Mitral valve prolapse Mother    Heart disease Mother        arrhythmia   Pulmonary fibrosis Father    Colon cancer Paternal Aunt    Heart disease Paternal Aunt    Heart disease Paternal Uncle    Heart failure Maternal Grandmother    Uterine cancer Paternal Grandmother        with mets to the colon    Kidney disease Cousin    Esophageal cancer Neg Hx    Stomach cancer Neg Hx    Rectal cancer Neg Hx    Allergies  Allergen Reactions   Dalbavancin Rash    Lips, skin tingling rash   Zoloft [Sertraline Hcl] Diarrhea   Current Outpatient Medications on File Prior to Visit  Medication Sig Dispense Refill   acetaminophen (TYLENOL) 325 MG tablet Take 2 tablets (650 mg total) by mouth every 4 (four) hours as needed for mild pain (or temp > 37.5 C (99.5 F)).     albuterol (PROVENTIL) (2.5 MG/3ML) 0.083% nebulizer solution Take 3 mLs (2.5 mg total) by nebulization every 6 (six) hours as needed for wheezing or shortness of breath. 150 mL 1   albuterol (VENTOLIN HFA) 108 (90 Base) MCG/ACT inhaler INHALE 2 PUFFS INTO THE LUNGS EVERY 4 (FOUR) HOURS AS NEEDED FOR WHEEZING, COUGH, OR SHORTNESS OF BREATH (Patient taking differently: Inhale 2 puffs into the lungs every 4 (four) hours as needed for wheezing or shortness of breath.) 6.7 each 3   atorvastatin (LIPITOR) 80 MG tablet TAKE 1 TABLET BY MOUTH DAILY AT 6 PM. (Patient taking differently: Take 80 mg by mouth at bedtime.) 90 tablet 0   buPROPion (WELLBUTRIN XL) 300 MG 24 hr tablet TAKE 1 TABLET BY MOUTH EVERY DAY (Patient taking differently: Take 300 mg by mouth at bedtime.) 90 tablet 0    citalopram (CELEXA) 20 MG tablet TAKE 1 TABLET BY MOUTH EVERY DAY (Patient taking differently: Take 20 mg by mouth at bedtime.) 90 tablet 0   fexofenadine (ALLEGRA) 180 MG tablet Take 180 mg by mouth daily as needed for allergies.     fluticasone (FLONASE) 50 MCG/ACT nasal spray Place 2 sprays into both  nostrils daily. (Patient taking differently: Place 2 sprays into both nostrils daily as needed.) 16 g 6   loperamide (IMODIUM) 2 MG capsule Take 1 capsule (2 mg total) by mouth as needed for diarrhea or loose stools. (Patient taking differently: Take 2 mg by mouth at bedtime.) 30 capsule 0   mesalamine (LIALDA) 1.2 g EC tablet TAKE 4 TABLETS BY MOUTH DAILY WITH BREAKFAST. (Patient taking differently: Take 4.8 g by mouth daily.) 360 tablet 2   nitroGLYCERIN (NITRODUR - DOSED IN MG/24 HR) 0.2 mg/hr patch Apply 1/4 patch to affected area as directed by MD and change every 24 hours. 30 patch 2   pantoprazole (PROTONIX) 40 MG tablet TAKE 1 TABLET BY MOUTH EVERY DAY (Patient taking differently: Take 40 mg by mouth at bedtime.) 90 tablet 0   vitamin B-12 (CYANOCOBALAMIN) 100 MCG tablet Take 1 tablet (100 mcg total) by mouth daily. 1 tablet    XARELTO 20 MG TABS tablet TAKE 1 TABLET (20 MG TOTAL) BY MOUTH DAILY WITH SUPPER. (Patient taking differently: Take 20 mg by mouth at bedtime.) 90 tablet 3   zolpidem (AMBIEN CR) 12.5 MG CR tablet TAKE 1 TABLET (12.5 MG TOTAL) BY MOUTH AT BEDTIME AS NEEDED. FOR SLEEP (Patient taking differently: Take 12.5 mg by mouth at bedtime. for sleep) 30 tablet 3   No current facility-administered medications on file prior to visit.    Review of Systems  Constitutional:  Negative for activity change, appetite change, fatigue, fever and unexpected weight change.  HENT:  Negative for congestion, ear pain, rhinorrhea, sinus pressure and sore throat.   Eyes:  Negative for pain, redness and visual disturbance.  Respiratory:  Negative for cough, shortness of breath and wheezing.    Cardiovascular:  Negative for chest pain and palpitations.  Gastrointestinal:  Negative for abdominal pain, blood in stool, constipation and diarrhea.  Endocrine: Negative for polydipsia and polyuria.  Genitourinary:  Negative for dysuria, frequency and urgency.  Musculoskeletal:  Negative for arthralgias, back pain and myalgias.  Skin:  Positive for rash. Negative for pallor.  Allergic/Immunologic: Negative for environmental allergies.  Neurological:  Negative for dizziness, syncope and headaches.  Hematological:  Negative for adenopathy. Does not bruise/bleed easily.  Psychiatric/Behavioral:  Negative for decreased concentration and dysphoric mood. The patient is not nervous/anxious.       Objective:   Physical Exam Constitutional:      General: She is not in acute distress.    Appearance: Normal appearance. She is obese. She is not ill-appearing.  HENT:     Head: Normocephalic and atraumatic.     Comments: No facial swelling     Nose: Nose normal.     Mouth/Throat:     Mouth: Mucous membranes are moist.     Pharynx: Oropharynx is clear.     Comments: No swelling Eyes:     General: No scleral icterus.    Conjunctiva/sclera: Conjunctivae normal.     Pupils: Pupils are equal, round, and reactive to light.  Cardiovascular:     Rate and Rhythm: Normal rate and regular rhythm.     Heart sounds: Normal heart sounds.  Pulmonary:     Effort: No respiratory distress.     Breath sounds: Normal breath sounds. No stridor. No wheezing, rhonchi or rales.  Musculoskeletal:     Cervical back: Normal range of motion. No tenderness.     Comments: Nl app LLE  No knee swelling  Nl rom  No tenderness or skin change   Lymphadenopathy:  Cervical: No cervical adenopathy.  Skin:    Coloration: Skin is not jaundiced or pale.     Findings: Rash present.     Comments: Erythematous papular rash on chest  Less on back  Some scabbing for skin on chin and forehead w/o erythema   No whelps or  skin breakdown  No redness or swelling of LLE  Neurological:     Mental Status: She is alert.     Sensory: No sensory deficit.     Coordination: Coordination normal.  Psychiatric:        Mood and Affect: Mood normal.          Assessment & Plan:   Problem List Items Addressed This Visit       Other   Allergy to drug    dalvance given in ER for cellulitis  Caused flushing/rash and papules   Today starting to improve, primarily on chest  Will continue to use benadryl and /or allegra for itch Recommend cool compress Drug added to allergy list  Watch for s/s of anaphylaxis (none now and reviewed) Update if not starting to improve in a week or if worsening        Cellulitis - Primary    ER visit on 10/28/21 Reviewed hospital records, lab results and studies in detail   Resolved with a dose of dalvance but pt had allergic reaction to it  Venous doppler nl -reassuring  Nl exam today  Will continue to monitor  Enc to avoid scratching skin and to keep hands clean

## 2021-11-02 NOTE — Patient Instructions (Signed)
If you feel like you need prednisone for the allergic reaction please let me know  ? ?Benadryl is ok  ?Carol Johnson is ok also  ?Try some cool compresses for itch ? ?If you have trouble breathing or swelling in mouth or throat call 911 ? ?Update if not starting to improve in a week or if worsening   ?

## 2021-11-02 NOTE — Assessment & Plan Note (Signed)
dalvance given in ER for cellulitis  ?Caused flushing/rash and papules  ? ?Today starting to improve, primarily on chest  ?Will continue to use benadryl and /or allegra for itch ?Recommend cool compress ?Drug added to allergy list  ?Watch for s/s of anaphylaxis (none now and reviewed) ?Update if not starting to improve in a week or if worsening   ?

## 2021-11-02 NOTE — Assessment & Plan Note (Signed)
ER visit on 10/28/21 ?Reviewed hospital records, lab results and studies in detail  ? ?Resolved with a dose of dalvance but pt had allergic reaction to it  ?Venous doppler nl -reassuring  ?Nl exam today  ?Will continue to monitor  ?Enc to avoid scratching skin and to keep hands clean  ?

## 2021-11-05 ENCOUNTER — Ambulatory Visit: Payer: No Typology Code available for payment source | Admitting: Family Medicine

## 2021-11-06 ENCOUNTER — Other Ambulatory Visit: Payer: Self-pay | Admitting: Gastroenterology

## 2021-11-13 ENCOUNTER — Telehealth: Payer: Self-pay

## 2021-11-13 ENCOUNTER — Encounter: Payer: Self-pay | Admitting: Family Medicine

## 2021-11-13 NOTE — Telephone Encounter (Signed)
Please see note from R Laws LPN and Dr Glori Bickers. Sending note to Dr Glori Bickers and Jenkins CMA. ?

## 2021-11-13 NOTE — Telephone Encounter (Signed)
Spoke to patient by telephone and was advised that she was taking benadryl since the allergic reaction to the medication given while she was in the hospital. Patient stated that she stopped taking the benadryl about 2-3 days ago. Patient stated the red raised patches started appearing about 2 days ago.Patient stated that she took two benadryl just now. ?Patient stated that she will send the pictures for Dr. Glori Bickers to see. ?Patient was given ER precautions and she verbalized understanding.  ?

## 2021-11-13 NOTE — Telephone Encounter (Signed)
[facility] - Client ?TELEPHONE ADVICE RECORD ?AccessNurse? ?Patient ?Name: ?Carol Johnson ?ETT ?Gender: Female ?DOB: 08/01/1966 ?Age: 56 Y 1 M 9 D ?Return ?Phone ?Number: ?[id number] ?(Primary) ?Address: ?City/ ?State/ ?Zip: ?[place] ? [address] ?Client [facility] - Client ?Client Site [facility] ?Provider [place], Roque Lias - MD ?Contact Type Call ?Who Is Calling Patient / Member / Family / Caregiver ?Call Type Triage / Clinical ?Relationship To Patient Self ?Return Phone Number [phone] (Primary) ?Chief Complaint Rash - Widespread ?Reason for Call Symptomatic / Request for Health Information ?Initial Comment Caller states she has a rash. She had an allergic ?reaction to a medicine. It goes across her belly and ?down the back of her legs and behind her knees. ?Translation No ?Nurse Assessment ?Nurse: Hassell Done, RN, Joelene Millin Date/Time [name] Time): 11/13/2021 4:14:50 PM ?Confirm and document reason for call. If ?symptomatic, describe symptoms. ?---caller states she has a red blotchy rash on her torso ?and her legs that began yesterday. no fever. ?Does the patient have any new or worsening ?symptoms? ---Yes ?Will a triage be completed? ---Yes ?Related visit to physician within the last 2 weeks? ---Yes ?Does the PT have any chronic conditions? (i.e. ?diabetes, asthma, this includes High risk factors for ?pregnancy, etc.) ?---Yes ?List chronic conditions. ---UC, asthma, hx of stroke 3 years ago ?Is this a behavioral health or substance abuse call? ---No ?Guidelines ?Guideline Title Affirmed Question Affirmed Notes Nurse Date/Time (Eastern ?Time) ?Rash or Redness - ?Widespread Mild widespread rash Hassell Done, Therapist, sports, ?Joelene Millin ?11/13/2021 4:17:53 ?PM ?Disp. Time (Eastern ?Time) Disposition Final User ?11/13/2021 4:24:25 PM SEE PCP WITHIN 3 DAYS Yes Hassell Done, RN, Joelene Millin ?Caller Disagree/Comply Comply ?PLEASE NOTE: All timestamps contained within this  report are represented as [place] Standard Time. ?CONFIDENTIALTY NOTICE: This fax transmission is intended only for the addressee. It contains information that is legally privileged, confidential or ?otherwise protected from use or disclosure. If you are not the intended recipient, you are strictly prohibited from reviewing, disclosing, copying using ?or disseminating any of this information or taking any action in reliance on or regarding this information. If you have received this fax in error, please ?notify us immediately by telephone so that we can arrange for its return to [place]. Phone: [phone], Toll-Free: [phone], Fax: [phone] ?Page: 2 of 2 ?Call Id: [id number] ?Caller Understands Yes ?PreDisposition Call Doctor ?Care Advice Given Per Guideline ?SEE PCP WITHIN 3 DAYS: * You need to be seen within 2 or 3 days. * There are many causes of widespread rashes and most ?of the time it is not serious. * Here is some care advice that should help. * Common causes include viral illness (non-specific ?viral exanthems) and allergic reactions (to a food, medicine or environmental exposure). REASSURANCE AND EDUCATION - ?WIDESPREAD RASH: ANTIHISTAMINE MEDICINES FOR MODERATE TO SEVERE ITCHING: * For moderate to severe ?itching, you can take either cetirizine, fexofenadine, or loratadine. * CETIRIZINE (REACTINE, ZYRTEC): The adult dose is 10 ?mg. You take it once a day. Cetirizine is available in the [place] as Zyrtec and in [place] as Reactine. * They are over-thecounter (OTC) antihistamine medicines. You can buy them at a drugstore or grocery store. * FEXOFENADINE (ALLEGRA): In the ?[place], the adult dose is one 24-hour tablet (180 mg) once a day. In [place], the adult dose is one 24-hour tablet (120 mg) once ?a day. Or, you can take one 12-hour (60 mg) tablet 2 times a day. * LORATADINE (ALAVERT,  CLARITIN): The adult dose is 10 ?mg. You take it once a day. Loratadine is available in the [place] as  Engineer, maintenance (IT); it is available in [place] as Claritin. ?REDUCING THE ITCH - OATMEAL (AVEENO) BATH: * Sprinkle contents of one packet of Aveeno under running faucet with ?comfortably warm water. * Bathe for 15 to 20 minutes, 1 to 2 times daily. * Pat dry using towel - do not rub. * Caution: This can ?make the tub slippery. HYDROCORTISONE CREAM FOR ITCHING: * You can use hydrocortisone for very itchy spots. CALL ?BACK IF: * Rash becomes purple or blood-colored or blister-like * Fever occurs or severe itching * You become worse CARE ?ADVICE given per Rash - Widespread and Cause Unknown (Adult) guideline ?

## 2021-11-13 NOTE — Telephone Encounter (Signed)
I responded by mychart and saw the pictures , thanks  ?

## 2021-11-13 NOTE — Telephone Encounter (Addendum)
Patient is  calling about an allergy to she had to a medication. She has been taking benadryl. Patient report rash like spotting abdominal, back of legs, and knees. Patient is wanting to know if this is another complication to her. Please advise ?

## 2021-11-17 ENCOUNTER — Other Ambulatory Visit: Payer: Self-pay | Admitting: Family

## 2021-11-17 ENCOUNTER — Other Ambulatory Visit: Payer: Self-pay | Admitting: Family Medicine

## 2021-11-17 DIAGNOSIS — J069 Acute upper respiratory infection, unspecified: Secondary | ICD-10-CM

## 2021-11-18 NOTE — Telephone Encounter (Signed)
Last OV on 11/02/21, xarelto filled on 11/19/20 #90 tabs with 3 refills, protonix last filled on 07/13/21 #90 tabs/ 0 refills please advise  ?

## 2021-12-29 ENCOUNTER — Telehealth: Payer: Self-pay | Admitting: Family Medicine

## 2021-12-29 NOTE — Telephone Encounter (Signed)
Pt called about this medication below, and said she is having a hard time sleeping and getting up. Would like to speak with the nurse about this. zolpidem (AMBIEN CR) 12.5 MG CR tablet ? ?Callback Number: [phone] ?

## 2021-12-29 NOTE — Telephone Encounter (Signed)
It helps to take ambien on an empty stomach. If she is already doing this or if she tries and it makes no difference please f/u in office.  ?Thnak s ?

## 2021-12-29 NOTE — Telephone Encounter (Signed)
Called pt back she said she is having sleep problems and it's due to the Ambien. She said one problem is sometimes she takes it and goes to sleep but then wakes up in the middle of the night and can't go back to sleep and then is very sleepy in the morning. However her main problem is she takes it at bedtime and it takes hours to "kick in" and by time it starts working it is in the middle of the night. Pt said she has a job and if she finally goes to sleep in the middle of the night by time her alarm goes off to go to work she doesn't hear it. She said it's very hard to wake her up in the morning and when she does get up she is very drowsy. Pt said her alarm was going off for an hr and her son finally came in and tried to wake her up and it took a very long time to awaken and she was very drowsy. Pt said it's starting to affect her work a lot she is late often and feels very foggy trying to work. It's to the point her manager has sat her down to discuss this. Pt is concerned for her job if she keeps having this issue. ? ?Pt said she doesn't know if she needs further testing or evaluation but in the meant time she needs something to help her sleep or she will not sleep at all. Pt remembered she was on xanax at one point and ?? If that's an option if not she needs to know what PCP recommends  ?  ?[facility] [address] ?

## 2021-12-29 NOTE — Telephone Encounter (Signed)
Left VM requesting pt to call the office back 

## 2021-12-30 NOTE — Telephone Encounter (Signed)
I will see her and discuss it then ?

## 2021-12-30 NOTE — Telephone Encounter (Signed)
Pt notified as instructed and pt voiced understanding. Pt was not sure if taking Ambien on empty stomach or not. Pt wants to change Ambien back to Xanax. Pt scheduled appt with Dr Glori Bickers on 12/31/21 at 11:30 and UC & ED precautions given and pt voiced understanding. Sending note to Dr Glori Bickers and [place] CMA. ?

## 2021-12-31 ENCOUNTER — Encounter: Payer: Self-pay | Admitting: Family Medicine

## 2021-12-31 ENCOUNTER — Ambulatory Visit: Payer: No Typology Code available for payment source | Admitting: Family Medicine

## 2021-12-31 VITALS — BP 138/74 | HR 84 | Temp 97.6°F | Ht 64.0 in | Wt 239.1 lb

## 2021-12-31 DIAGNOSIS — G4733 Obstructive sleep apnea (adult) (pediatric): Secondary | ICD-10-CM | POA: Diagnosis not present

## 2021-12-31 DIAGNOSIS — F5101 Primary insomnia: Secondary | ICD-10-CM | POA: Diagnosis not present

## 2021-12-31 DIAGNOSIS — F418 Other specified anxiety disorders: Secondary | ICD-10-CM

## 2021-12-31 MED ORDER — TRAZODONE HCL 50 MG PO TABS
50.0000 mg | ORAL_TABLET | Freq: Every evening | ORAL | 3 refills | Status: DC | PRN
Start: 2021-12-31 — End: 2022-01-26

## 2021-12-31 NOTE — Assessment & Plan Note (Signed)
No doubt playing a role in poor sleep and fatigue ? ?Reviewed stressors/ coping techniques/symptoms/ support sources/ tx options and side effects in detail today  ?inst her to change her wellbutrin xl 300 mg daily dosing time to am in case it affects sleep  ?Continue celexa 20 mg daily in evening  ?For help with sleep-ordered trazodone 50 mg (may take up to 100) daily   ?

## 2021-12-31 NOTE — Patient Instructions (Addendum)
Consider touching base with pulmonary about the cpap  ?I can send them a message also  ? ?You saw Dr Rexene Alberts in august  ?You may be able to call the neurology office and have them re schedule your sleep study  ? ?Change your wellbutrin to morning  ? ?Magnesium may help anxiety and sleep  ?You can try 250 mg over the counter at bedtime  ?If it makes your diarrhea worse than stop it  ? ?Start cutting back sodas by one a week until you down to one in the am  ? ?Try trazodone for sleep  ?50 mg pills    you can take 1/2 pill to 2 pills nightly  ?Let me know how that goes in a few weeks  ? ?If side effects or if you feel more depressed or anxious hold the medicine and let us know  ? ?It will be harder due to ambien rebound initially  ? ? ? ? ? ? ? ? ?

## 2021-12-31 NOTE — Assessment & Plan Note (Addendum)
Multifactorial with stressors/depression /anxiety and OSA  As well as lack of exercise and caffeine intake ?Reviewed old notes and pulmonary plan today  ?Now Carol Johnson does not work well and causes pm behavior change like mid night eating  ? ?inst pt to change wellbutrin dose to am ?Gradually cut soda by 1 serving a week ?Consider some day time exercise (pref outdoors first half of the day) ?Stop ambien ?Consider trial of low dose magnesium (250 mg) -if it does not worsen diarrhea ?F/u with neurology re: OSA eval and treatment  ?Trial of trazodone 50 mg pills (to try 25 to 100 mg qhs and see if helpful)  ?Rev poss side eff and inst to stop if it makes mood worse  ? ?Would like to avoid benzodiazepines in light of side effects and long term risks  ? ?

## 2021-12-31 NOTE — Assessment & Plan Note (Addendum)
Pt needs to re schedule her sleep study and will reach out to neurology ?I suspect this has a big role in her chronic fatigue and poor sleep  (as well as inc risk of stroke)  ? ?Disc this with her in detail  ?

## 2021-12-31 NOTE — Progress Notes (Signed)
? ?Subjective:  ? ? Patient ID: Carol Johnson, female    DOB: 1966-05-07, 56 y.o.   MRN: [id number] ? ?HPI ?Pt presents for f/u of insomnia  ? ?Wt Readings from Last 3 Encounters:  ?12/31/21 239 lb 2 oz (108.5 kg)  ?11/02/21 226 lb (102.5 kg)  ?09/07/21 237 lb 2 oz (107.6 kg)  ? ?41.05 kg/m? ? ?Insomnia  ?Gradually getting worse with time  ? ?Has taken ambien cr 12.5 mg daily  ( 5 years)  ?Taking it longer to get to sleep (usually goes to bed around 11) goes to sleep 2-4 am ?Tosses and turns  ?Has to get back up  ? ?Exercise-not much  ?Had an ankle problem  ? ? ?When she does fall asleep -stays asleep for 4-6 hours  ?No idea how much sleep she needs  ? ? ?Usually eats around 7  ?Stomach is not full when she goes to bed but not empty either  ? ?She has noted some night time behaviors with ambien  ?Found dishes in sink ?She eats /preps food -thinks she has cooked  ?On and off  ?Has not left or gotten in car  ? ? ?Sleep hygeine- dark ?Cool  ?Gets rid of phone  ? ? ? ?Has always had trouble going to sleep  ? ? ?Caffeine history : drinks 6 sodas per day (avoids after dinner)  ?Diet doctor pepper  ?Rarely coffee  ?Tea occ /not often  ?Chocolate- no  ? ?Alcohol history : seldom , 1 drink every 6 months  ? ? ?OSA history -never did try the cpap  ?Too many doctors, cannot keep up and cannot keep up with bills  ?Dr Rexene Alberts  ? ? ? ?Stroke history -concerning  ? ? ?Depression /anxiety:  ?Terrible  ?Lot of stress at work/unstable job /worries about loosing job  ?Sleep problems make it hard to wake up -she sleeps through alarms and calls  ? ?Thinks lack of sleep makes her depression worse  ?That is hard/scaring her now  ? ? ? ? ?Celexa 20 mg at bedtime  ?Wellbutrin 300 xl at bedtime   ? ?BP Readings from Last 3 Encounters:  ?12/31/21 138/74  ?11/02/21 140/80  ?10/29/21 (!) 150/90  ? ?Pulse Readings from Last 3 Encounters:  ?12/31/21 84  ?11/02/21 70  ?10/29/21 80  ? ? ?Patient Active Problem List  ? Diagnosis Date Noted  ?  Cellulitis 11/02/2021  ? Allergy to drug 11/02/2021  ? Bacterial skin infection 10/29/2021  ? COVID-19 09/29/2021  ? Cerebrovascular accident [facility]) 01/12/2021  ? Chronic neck pain 01/12/2021  ? Gait abnormality 01/12/2021  ? Snores 01/12/2021  ? Vertigo 10/29/2020  ? Hearing loss 10/29/2020  ? Acute respiratory infection 09/19/2020  ? Hypercoagulable state ([place]) 12/18/2019  ? Hair loss 12/03/2019  ? Aneurysm of anterior cerebral artery 12/03/2019  ? Abnormality of gait 09/04/2019  ? Hemiparesis affecting nondominant side as late effect of stroke ([place]) 07/24/2019  ? Muscle weakness (generalized) 06/11/2019  ? Hemiplegia and hemiparesis following cerebral infarction affecting left non-dominant side ([place]) 06/11/2019  ? Major depressive disorder   ? Hypokalemia   ? Acute blood loss anemia   ? Ulcerative colitis with complication ([place])   ? H/O: CVA (cerebrovascular accident) 05/23/2019  ? Thrombocytosis 05/23/2019  ? Coronary artery disease involving native coronary artery of native heart without angina pectoris 03/21/2018  ? Coronary artery calcification 02/10/2018  ? Obesity (BMI 30-39.9) 02/10/2018  ? Former smoker 01/15/2018  ? Dyspnea on exertion 12/21/2017  ?  Carpal tunnel syndrome 12/21/2017  ? Stress incontinence 12/21/2017  ? Paresthesia of both feet 07/04/2017  ? Always thirsty 07/04/2017  ? Abdominal bloating 12/01/2015  ? Fatigue 12/01/2015  ? Vitamin B12 deficiency 12/01/2015  ? Lymphocytic colitis 01/16/2015  ? Anemia, iron deficiency 01/16/2015  ? Anxiety state 12/20/2014  ? Diarrhea 11/27/2014  ? Insomnia 11/27/2014  ? History of DVT (deep vein thrombosis) 09/17/2014  ? OSA (obstructive sleep apnea) 09/02/2014  ? Gastritis, chronic 06/06/2014  ? Unspecified gastritis and gastroduodenitis without mention of hemorrhage 05/03/2014  ? Abdominal pain, epigastric 04/19/2014  ? Internal hemorrhoids with other complication 62/56/3893  ? Routine general medical examination at a health care facility 10/14/2011  ?  Thyroid nodule 05/16/2011  ? Rectal bleeding 11/24/2010  ? Hemorrhoids, internal 11/24/2010  ? Irritable bowel syndrome (IBS) 11/24/2010  ? ANEMIA 10/02/2009  ? Hyperlipidemia 07/18/2009  ? Essential hypertension 07/18/2009  ? DYSPNEA ON EXERTION 07/18/2009  ? THYROID CYST 01/31/2008  ? ANXIETY 01/31/2008  ? Depression with anxiety 01/31/2008  ? GASTRIC ULCER, HX OF 01/31/2008  ? FIBROIDS, UTERUS 11/13/2007  ? Allergic rhinitis 11/13/2007  ? Asthma 11/13/2007  ? GERD 11/13/2007  ? IRRITABLE BOWEL SYNDROME 11/13/2007  ? INTERNAL HEMORRHOIDS 11/12/2003  ? ?Past Medical History:  ?Diagnosis Date  ? Abdominal pain, unspecified site   ? Allergic rhinitis   ? Anxiety   ? Asthma   ? Depression   ? DVT (deep venous thrombosis) ([place]) 2016  ? DVT of deep femoral vein, left ([place])   ? Fibroid uterus   ? GERD (gastroesophageal reflux disease)   ? Hypercholesterolemia   ? Hypertension   ? Internal hemorrhoid   ? Irritable bowel syndrome   ? Lymphocytic colitis 01/02/2015  ? Pneumonia 1998  ? Stroke [facility]) 2020  ? left sided weekness  ? Ulcerative colitis ([facility])   ? URI (upper respiratory infection)   ? Uterine fibroid   ? Vitamin B12 deficiency   ? ?Past Surgical History:  ?Procedure Laterality Date  ? CESAREAN SECTION  08/30/2002  ? CHOLECYSTECTOMY    ? laparoscopic "gallstones"  ? ESOPHAGOGASTRODUODENOSCOPY (EGD) WITH PROPOFOL N/A 05/03/2014  ? Procedure: ESOPHAGOGASTRODUODENOSCOPY (EGD) WITH PROPOFOL;  Surgeon: Inda Castle, MD;  Location: [facility] END[facility]COPY;  Service: Endoscopy;  Laterality: N/A;  ? UTERINE FIBROID SURGERY    ? ?Social History  ? ?Tobacco Use  ? Smoking status: Former  ?  Packs/day: 0.10  ?  Years: 10.00  ?  Pack years: 1.00  ?  Types: Cigarettes  ?  Quit date: 12/07/2017  ?  Years since quitting: 4.0  ? Smokeless tobacco: Never  ?Vaping Use  ? Vaping Use: Never used  ?Substance Use Topics  ? Alcohol use: Yes  ?  Alcohol/week: 0.0 standard drinks  ?  Comment: rarely  ? Drug use: No  ? ?Family History  ?Problem  Relation Age of Onset  ? Hyperlipidemia Mother   ? Mitral valve prolapse Mother   ? Heart disease Mother   ?     arrhythmia  ? Pulmonary fibrosis Father   ? Colon cancer Paternal Aunt   ? Heart disease Paternal Aunt   ? Heart disease Paternal Uncle   ? Heart failure Maternal Grandmother   ? Uterine cancer Paternal Grandmother   ?     with mets to the colon   ? Kidney disease Cousin   ? Esophageal cancer Neg Hx   ? Stomach cancer Neg Hx   ? Rectal cancer Neg Hx   ? ?  Allergies  ?Allergen Reactions  ? Dalbavancin Rash  ?  Lips, skin tingling rash  ? Zoloft [Sertraline Hcl] Diarrhea  ? ?Current Outpatient Medications on File Prior to Visit  ?Medication Sig Dispense Refill  ? acetaminophen (TYLENOL) 325 MG tablet Take 2 tablets (650 mg total) by mouth every 4 (four) hours as needed for mild pain (or temp > 37.5 C (99.5 F)).    ? albuterol (PROVENTIL) (2.5 MG/3ML) 0.083% nebulizer solution Take 3 mLs (2.5 mg total) by nebulization every 6 (six) hours as needed for wheezing or shortness of breath. 150 mL 1  ? albuterol (VENTOLIN HFA) 108 (90 Base) MCG/ACT inhaler INHALE 2 PUFFS INTO THE LUNGS EVERY 4 (FOUR) HOURS AS NEEDED FOR WHEEZING, COUGH, OR SHORTNESS OF BREATH (Patient taking differently: Inhale 2 puffs into the lungs every 4 (four) hours as needed for wheezing or shortness of breath.) 6.7 each 3  ? atorvastatin (LIPITOR) 80 MG tablet TAKE 1 TABLET BY MOUTH DAILY AT 6 PM. (Patient taking differently: Take 80 mg by mouth at bedtime.) 90 tablet 0  ? buPROPion (WELLBUTRIN XL) 300 MG 24 hr tablet TAKE 1 TABLET BY MOUTH EVERY DAY (Patient taking differently: Take 300 mg by mouth at bedtime.) 90 tablet 0  ? citalopram (CELEXA) 20 MG tablet TAKE 1 TABLET BY MOUTH EVERY DAY (Patient taking differently: Take 20 mg by mouth at bedtime.) 90 tablet 0  ? fexofenadine (ALLEGRA) 180 MG tablet Take 180 mg by mouth daily as needed for allergies.    ? fluticasone (FLONASE) 50 MCG/ACT nasal spray Place 2 sprays into both nostrils  daily. (Patient taking differently: Place 2 sprays into both nostrils daily as needed.) 16 g 6  ? loperamide (IMODIUM) 2 MG capsule Take 1 capsule (2 mg total) by mouth as needed for diarrhea or loose stools. (Patient t

## 2022-01-10 ENCOUNTER — Ambulatory Visit (INDEPENDENT_AMBULATORY_CARE_PROVIDER_SITE_OTHER): Payer: No Typology Code available for payment source | Admitting: Neurology

## 2022-01-10 DIAGNOSIS — R351 Nocturia: Secondary | ICD-10-CM

## 2022-01-10 DIAGNOSIS — G4733 Obstructive sleep apnea (adult) (pediatric): Secondary | ICD-10-CM

## 2022-01-10 DIAGNOSIS — R0683 Snoring: Secondary | ICD-10-CM

## 2022-01-10 DIAGNOSIS — G4719 Other hypersomnia: Secondary | ICD-10-CM

## 2022-01-10 DIAGNOSIS — Z8673 Personal history of transient ischemic attack (TIA), and cerebral infarction without residual deficits: Secondary | ICD-10-CM

## 2022-01-10 DIAGNOSIS — G4720 Circadian rhythm sleep disorder, unspecified type: Secondary | ICD-10-CM

## 2022-01-18 NOTE — Procedures (Signed)
PATIENT'S NAME:  Carol Johnson, Dixson DOB:      Nov 17, 1965      MR#:    [id number]     DATE OF RECORDING: 01/10/2022 REFERRING M.D.:  Marcial Pacas, MD Study Performed:   Baseline Polysomnogram HISTORY: 56 year old woman with a history of hypertension, hyperlipidemia, stroke, ulcerative colitis, vitamin B12 deficiency, DVT, reflux disease, irritable bowel syndrome, recurrent headaches, pneumonia, anxiety, depression, asthma, allergic rhinitis, and severe obesity with a BMI of over 45, who reports snoring and excessive daytime somnolence. The patient endorsed the Epworth Sleepiness Scale at 10 points. The patient's weight 245 pounds with a height of 62 (inches), resulting in a BMI of 45.6 kg/m2. The patient's neck circumference measured 17 inches.  CURRENT MEDICATIONS: Tylenol, Ventolin HFA, Lipitor, Wellbutrin XL, Celexa, Derma-Smoothe, Allegra, Flonase, Nizoral, Imodium, Lialda, Robaxin, Protonix, Cyanocobalamin, Xarelto, Ambien CR   PROCEDURE:  This is a multichannel digital polysomnogram utilizing the Somnostar 11.2 system.  Electrodes and sensors were applied and monitored per AASM Specifications.   EEG, EOG, Chin and Limb EMG, were sampled at 200 Hz.  ECG, Snore and Nasal Pressure, Thermal Airflow, Respiratory Effort, CPAP Flow and Pressure, Oximetry was sampled at 50 Hz. Digital video and audio were recorded.      BASELINE STUDY  Lights Out was at 23:12 and Lights On at 05:20.  Total recording time (TRT) was 368.5 minutes, with a total sleep time (TST) of 257 minutes.   The patient's sleep latency was 40.5 minutes, which is delayed.  REM latency was 301.5 minutes, which is markedly delayed. The sleep efficiency was 69.7 %.     SLEEP ARCHITECTURE: WASO (Wake after sleep onset) was 69 minutes with one longer period of wakefulness and otherwise mild sleep fragmentation noted. There were 3.5 minutes in Stage N1, 229 minutes Stage N2, 0 minutes Stage N3 and 24.5 minutes in Stage REM.  The percentage of Stage N1  was 1.4%, Stage N2 was 89.1%, which is markedly increased, Stage N3 was absent, and Stage R (REM sleep) was 9.5%, which is reduced. The arousals were noted as: 70 were spontaneous, 0 were associated with PLMs, 9 were associated with respiratory events.  RESPIRATORY ANALYSIS:  There were a total of 26 respiratory events:  0 obstructive apneas, 0 central apneas and 0 mixed apneas with a total of 0 apneas and an apnea index (AI) of 0 /hour. There were 26 hypopneas with a hypopnea index of 6.1 /hour. The patient also had 0 respiratory event related arousals (RERAs).      The total APNEA/HYPOPNEA INDEX (AHI) was 6.1/hour and the total RESPIRATORY DISTURBANCE INDEX was  6.1 /hour.  17 events occurred in REM sleep and 18 events in NREM. The REM AHI was  41.6 /hour, versus a non-REM AHI of 2.3. The patient spent 0 minutes of total sleep time in the supine position and 257 minutes in non-supine.. The supine AHI was n/a versus a non-supine AHI of 6.1.  OXYGEN SATURATION & C02:  The Wake baseline 02 saturation was 94%, with the lowest being 80%. Time spent below 89% saturation equaled 12 minutes.  PERIODIC LIMB MOVEMENTS:   The patient had a total of 0 Periodic Limb Movements.  The Periodic Limb Movement (PLM) index was 0 and the PLM Arousal index was 0/hour.  Audio and video analysis did not show any abnormal or unusual movements, behaviors, phonations or vocalizations. The patient took 1 bathroom break. Mild to moderate snoring was noted. The EKG was in keeping with normal sinus rhythm (NSR).  Post-study, the patient indicated that sleep was worse than usual.   IMPRESSION:  Mild Obstructive Sleep Apnea (OSA) Dysfunctions associated with sleep stages or arousal from sleep  RECOMMENDATIONS:  This study demonstrates overall mild obstructive sleep apnea, more pronounced during REM sleep with a total AHI of 6.1/hour, REM AHI of 41.6/hour, and O2 nadir of 80%. The absence of supine sleep likely underestimates  her sleep disordered breathing; patient indicated, that she could not sleep on her back. Given the patient's medical history and sleep related complaints, treatment with positive airway pressure can be considered in the form of autoPAP. A full-night CPAP titration study would allow optimization of therapy if needed, down the road. Other treatment options may include avoidance of supine sleep position along with weight loss, or the use of an oral appliance in certain patients. Please note that untreated obstructive sleep apnea may carry additional perioperative morbidity. Patients with significant obstructive sleep apnea should receive perioperative PAP therapy and the surgeons and particularly the anesthesiologist should be informed of the diagnosis and the severity of the sleep disordered breathing. This study shows sleep fragmentation and abnormal sleep stage percentages; these are nonspecific findings and per se do not signify an intrinsic sleep disorder or a cause for the patient's sleep-related symptoms. Causes include (but are not limited to) the first night effect of the sleep study, circadian rhythm disturbances, medication effect or an underlying mood disorder or medical problem.  The patient should be cautioned not to drive, work at heights, or operate dangerous or heavy equipment when tired or sleepy. Review and reiteration of good sleep hygiene measures should be pursued with any patient. The patient will be seen in follow-up by Dr. Rexene Alberts at [facility] for discussion of the test results and further management strategies. The referring provider will be notified of the test results. I certify that I have reviewed the entire raw data recording prior to the issuance of this report in accordance with the Standards of Accreditation of the American Academy of Sleep Medicine (AASM)  Star Age, MD, PhD Diplomat, American Board of Neurology and Sleep Medicine (Neurology and Sleep Medicine)

## 2022-01-18 NOTE — Addendum Note (Signed)
Addended by: Star Age on: 01/18/2022 06:00 PM   Modules accepted: Orders

## 2022-01-19 ENCOUNTER — Telehealth: Payer: Self-pay

## 2022-01-19 NOTE — Telephone Encounter (Signed)
I called patient to discuss. No answer, left a message asking her to  call me back. If patient returns call another day please route to POD 4.

## 2022-01-19 NOTE — Telephone Encounter (Signed)
I called patient.  I had an extended conversation with her discussing her sleep study results and recommendations for treatment.  After extensive discussion she decided to use the device.  She will speak with her personal dentist.  She will let us know if she needs a referral to another dentist or office.  Patient will also pursue weight loss.  Patient already avoids supine sleep.  Patient verbalized understanding of results and recommendations and had no further questions or concerns at this time.

## 2022-01-19 NOTE — Telephone Encounter (Signed)
-----   Message from Star Age, MD sent at 01/18/2022  6:00 PM EDT ----- Patient referred by Dr. Krista Blue, seen by me on 04/23/21, diagnostic PSG on 01/10/22.    Please call and notify the patient that the recent sleep study showed mild obstructive sleep apnea (OSA), we can treat with an autoPAP, if she is agreeable, which means, that we don't have to bring her back for a second sleep study with CPAP, but will let her try an autoPAP machine at home, through a DME company (of her choice, or as per insurance requirement). The DME representative will educate her on how to use the machine, how to put the mask on, etc. I have placed an order in the chart. Other treatment options may include avoidance of supine sleep position along with weight loss, or the use of an oral appliance in certain patients. If she is agreeable, send referral to DME. We will need a FU in sleep clinic for 10 weeks post-PAP set up, please arrange that with me or one of our NPs. Thanks,   Star Age, MD, PhD [facility])

## 2022-01-25 ENCOUNTER — Other Ambulatory Visit: Payer: Self-pay | Admitting: Family Medicine

## 2022-01-26 ENCOUNTER — Encounter: Payer: Self-pay | Admitting: Family Medicine

## 2022-01-26 ENCOUNTER — Ambulatory Visit: Payer: No Typology Code available for payment source | Admitting: Family Medicine

## 2022-01-26 VITALS — BP 146/84 | HR 76 | Temp 98.0°F | Ht 64.0 in | Wt 242.0 lb

## 2022-01-26 DIAGNOSIS — J209 Acute bronchitis, unspecified: Secondary | ICD-10-CM | POA: Diagnosis not present

## 2022-01-26 DIAGNOSIS — R059 Cough, unspecified: Secondary | ICD-10-CM | POA: Diagnosis not present

## 2022-01-26 DIAGNOSIS — J0100 Acute maxillary sinusitis, unspecified: Secondary | ICD-10-CM

## 2022-01-26 LAB — POC COVID19 BINAXNOW: SARS Coronavirus 2 Ag: NEGATIVE

## 2022-01-26 MED ORDER — PREDNISONE 10 MG PO TABS: ORAL_TABLET | ORAL | 0 refills | Status: DC

## 2022-01-26 MED ORDER — AMOXICILLIN-POT CLAVULANATE 875-125 MG PO TABS: 1.0000 | ORAL_TABLET | Freq: Two times a day (BID) | ORAL | 0 refills | Status: DC

## 2022-01-26 MED ORDER — ALBUTEROL SULFATE HFA 108 (90 BASE) MCG/ACT IN AERS: INHALATION_SPRAY | RESPIRATORY_TRACT | 3 refills | Status: DC

## 2022-01-26 MED ORDER — BENZONATATE 200 MG PO CAPS: 200.0000 mg | ORAL_CAPSULE | Freq: Three times a day (TID) | ORAL | 1 refills | Status: DC | PRN

## 2022-01-26 NOTE — Patient Instructions (Signed)
An expectorant like mucinex or robitussin will help with congestion  Nasal saline is helpful Also steam  Drink fluids Tessalon for cough as needed   Take prednisone as directed (can make you feel hyper and hungry) Take the augmentin for sinus and ear infection   Update if not starting to improve in a week or if worsening

## 2022-01-26 NOTE — Assessment & Plan Note (Signed)
Viral Also s/s of sinusitis and OM on R   Px prednisone (disc side eff) Fluids/rest and sympt care Tessalon for cough  augmentin for OM and sinus infection   Neg covid test today  Update if not starting to improve in a week or if worsening  ER precautions reviewed

## 2022-01-26 NOTE — Telephone Encounter (Signed)
Pharmacy just request Rx to be changed to a 90 day Rx. Since PCP has already filled med for 3 months Rx changed

## 2022-01-26 NOTE — Progress Notes (Signed)
Subjective:    Patient ID: Carol Johnson, female    DOB: Sep 03, 1965, 56 y.o.   MRN: [id number]  HPI Pt presents with uri symptoms/sinus issues  Wt Readings from Last 3 Encounters:  01/26/22 242 lb (109.8 kg)  12/31/21 239 lb 2 oz (108.5 kg)  11/02/21 226 lb (102.5 kg)   41.54 kg/m  Started last Wednesday as tickle in throat  Then ST Then cough and headache and low grade temp (she thinks)- hot/cold   Had to leave a grad ceremony because she was coughing  Incontinence when she coughs    Cough and congestion -keeping her up at night  Phlegm is green/yellow  A little hoarse Ears hurt/R side more  Some wheezing   Pain in forehead  Tightness in cheeks  Nose is so congested she cannot get anything else   Hot and cold No body aches  Headache    Last covid imm was 2021  Patient Active Problem List   Diagnosis Date Noted   Acute bronchitis 01/26/2022   Cellulitis 11/02/2021   Allergy to drug 11/02/2021   Bacterial skin infection 10/29/2021   COVID-19 09/29/2021   Cerebrovascular accident ([place]) 01/12/2021   Chronic neck pain 01/12/2021   Gait abnormality 01/12/2021   Snores 01/12/2021   Vertigo 10/29/2020   Hearing loss 10/29/2020   Hypercoagulable state ([place]) 12/18/2019   Hair loss 12/03/2019   Aneurysm of anterior cerebral artery 12/03/2019   Abnormality of gait 09/04/2019   Hemiparesis affecting nondominant side as late effect of stroke ([place]) 07/24/2019   Muscle weakness (generalized) 06/11/2019   Hemiplegia and hemiparesis following cerebral infarction affecting left non-dominant side ([place]) 06/11/2019   Major depressive disorder    Hypokalemia    Acute blood loss anemia    Ulcerative colitis with complication ([place])    H/O: CVA (cerebrovascular accident) 05/23/2019   Thrombocytosis 05/23/2019   Coronary artery disease involving native coronary artery of native heart without angina pectoris 03/21/2018   Coronary artery calcification 02/10/2018   Obesity  (BMI 30-39.9) 02/10/2018   Former smoker 01/15/2018   Dyspnea on exertion 12/21/2017   Carpal tunnel syndrome 12/21/2017   Stress incontinence 12/21/2017   Paresthesia of both feet 07/04/2017   Always thirsty 07/04/2017   Abdominal bloating 12/01/2015   Acute sinusitis 12/01/2015   Fatigue 12/01/2015   Vitamin B12 deficiency 12/01/2015   Lymphocytic colitis 01/16/2015   Anemia, iron deficiency 01/16/2015   Anxiety state 12/20/2014   Diarrhea 11/27/2014   Insomnia 11/27/2014   History of DVT (deep vein thrombosis) 09/17/2014   OSA (obstructive sleep apnea) 09/02/2014   Gastritis, chronic 06/06/2014   Unspecified gastritis and gastroduodenitis without mention of hemorrhage 05/03/2014   Abdominal pain, epigastric 04/19/2014   Internal hemorrhoids with other complication 41/96/2229   Routine general medical examination at a health care facility 10/14/2011   Thyroid nodule 05/16/2011   Rectal bleeding 11/24/2010   Hemorrhoids, internal 11/24/2010   Irritable bowel syndrome (IBS) 11/24/2010   ANEMIA 10/02/2009   Hyperlipidemia 07/18/2009   Essential hypertension 07/18/2009   DYSPNEA ON EXERTION 07/18/2009   THYROID CYST 01/31/2008   ANXIETY 01/31/2008   Depression with anxiety 01/31/2008   GASTRIC ULCER, HX OF 01/31/2008   FIBROIDS, UTERUS 11/13/2007   Allergic rhinitis 11/13/2007   Asthma 11/13/2007   GERD 11/13/2007   IRRITABLE BOWEL SYNDROME 11/13/2007   INTERNAL HEMORRHOIDS 11/12/2003   Past Medical History:  Diagnosis Date   Abdominal pain, unspecified site    Allergic rhinitis  Anxiety    Asthma    Depression    DVT (deep venous thrombosis) ([place]) 2016   DVT of deep femoral vein, left (H[facility])    Fibroid uterus    GERD (gastroesophageal reflux disease)    Hypercholesterolemia    Hypertension    Internal hemorrhoid    Irritable bowel syndrome    Lymphocytic colitis 01/02/2015   Pneumonia 1998   Stroke ([place]) 2020   left sided weekness   Ulcerative colitis  ([place])    URI (upper respiratory infection)    Uterine fibroid    Vitamin B12 deficiency    Past Surgical History:  Procedure Laterality Date   CESAREAN SECTION  08/30/2002   CHOLECYSTECTOMY     laparoscopic "gallstones"   ESOPHAGOGASTRODUODENOSCOPY (EGD) WITH PROPOFOL N/A 05/03/2014   Procedure: ESOPHAGOGASTRODUODENOSCOPY (EGD) WITH PROPOFOL;  Surgeon: Inda Castle, MD;  Location: [facility];  Service: Endoscopy;  Laterality: N/A;   UTERINE FIBROID SURGERY     Social History   Tobacco Use   Smoking status: Former    Packs/day: 0.10    Years: 10.00    Pack years: 1.00    Types: Cigarettes    Quit date: 12/07/2017    Years since quitting: 4.1   Smokeless tobacco: Never  Vaping Use   Vaping Use: Never used  Substance Use Topics   Alcohol use: Yes    Alcohol/week: 0.0 standard drinks    Comment: rarely   Drug use: No   Family History  Problem Relation Age of Onset   Hyperlipidemia Mother    Mitral valve prolapse Mother    Heart disease Mother        arrhythmia   Pulmonary fibrosis Father    Colon cancer Paternal Aunt    Heart disease Paternal Aunt    Heart disease Paternal Uncle    Heart failure Maternal Grandmother    Uterine cancer Paternal Grandmother        with mets to the colon    Kidney disease Cousin    Esophageal cancer Neg Hx    Stomach cancer Neg Hx    Rectal cancer Neg Hx    Allergies  Allergen Reactions   Dalbavancin Rash    Lips, skin tingling rash   Zoloft [Sertraline Hcl] Diarrhea   Current Outpatient Medications on File Prior to Visit  Medication Sig Dispense Refill   acetaminophen (TYLENOL) 325 MG tablet Take 2 tablets (650 mg total) by mouth every 4 (four) hours as needed for mild pain (or temp > 37.5 C (99.5 F)).     albuterol (PROVENTIL) (2.5 MG/3ML) 0.083% nebulizer solution Take 3 mLs (2.5 mg total) by nebulization every 6 (six) hours as needed for wheezing or shortness of breath. 150 mL 1   atorvastatin (LIPITOR) 80 MG tablet TAKE 1  TABLET BY MOUTH DAILY AT 6 PM. (Patient taking differently: Take 80 mg by mouth at bedtime.) 90 tablet 0   buPROPion (WELLBUTRIN XL) 300 MG 24 hr tablet TAKE 1 TABLET BY MOUTH EVERY DAY (Patient taking differently: Take 300 mg by mouth at bedtime.) 90 tablet 0   citalopram (CELEXA) 20 MG tablet TAKE 1 TABLET BY MOUTH EVERY DAY (Patient taking differently: Take 20 mg by mouth at bedtime.) 90 tablet 0   fexofenadine (ALLEGRA) 180 MG tablet Take 180 mg by mouth daily as needed for allergies.     fluticasone (FLONASE) 50 MCG/ACT nasal spray Place 2 sprays into both nostrils daily. (Patient taking differently: Place 2 sprays into both nostrils  daily as needed.) 16 g 6   loperamide (IMODIUM) 2 MG capsule Take 1 capsule (2 mg total) by mouth as needed for diarrhea or loose stools. (Patient taking differently: Take 2 mg by mouth at bedtime.) 30 capsule 0   mesalamine (LIALDA) 1.2 g EC tablet TAKE 4 TABLETS BY MOUTH DAILY WITH BREAKFAST. (Patient taking differently: Take 4.8 g by mouth daily.) 360 tablet 2   pantoprazole (PROTONIX) 40 MG tablet TAKE 1 TABLET BY MOUTH EVERY DAY 90 tablet 3   rivaroxaban (XARELTO) 20 MG TABS tablet TAKE 1 TABLET BY MOUTH DAILY WITH SUPPER. 90 tablet 3   traZODone (DESYREL) 50 MG tablet Take 1-2 tablets (50-100 mg total) by mouth at bedtime as needed for sleep. 60 tablet 3   vitamin B-12 (CYANOCOBALAMIN) 100 MCG tablet Take 1 tablet (100 mcg total) by mouth daily. 1 tablet    No current facility-administered medications on file prior to visit.    Review of Systems  Constitutional:  Positive for appetite change and fatigue. Negative for fever.       Hot and cold  HENT:  Positive for congestion, ear pain, postnasal drip, rhinorrhea, sinus pressure, sinus pain, sneezing and sore throat. Negative for ear discharge.   Eyes:  Negative for pain and discharge.  Respiratory:  Positive for cough and wheezing. Negative for shortness of breath and stridor.   Cardiovascular:  Negative  for chest pain.  Gastrointestinal:  Negative for diarrhea, nausea and vomiting.  Genitourinary:  Negative for frequency, hematuria and urgency.  Musculoskeletal:  Negative for arthralgias and myalgias.  Skin:  Negative for rash.  Neurological:  Positive for headaches. Negative for dizziness, weakness and light-headedness.  Psychiatric/Behavioral:  Negative for confusion and dysphoric mood.       Objective:   Physical Exam Constitutional:      General: She is not in acute distress.    Appearance: Normal appearance. She is well-developed. She is obese. She is not ill-appearing or diaphoretic.  HENT:     Head: Normocephalic and atraumatic.     Comments: Nares are injected and congested  Clear pnd   Bilateral frontal sinus tenderness  No facial swelling    Right Ear: Ear canal and external ear normal.     Left Ear: Tympanic membrane, ear canal and external ear normal.     Ears:     Comments: R TM is dull and slt pink    Nose: Congestion and rhinorrhea present.     Mouth/Throat:     Mouth: Mucous membranes are moist.     Pharynx: No oropharyngeal exudate or posterior oropharyngeal erythema.     Comments: Clear pnd Eyes:     General:        Right eye: No discharge.        Left eye: No discharge.     Conjunctiva/sclera: Conjunctivae normal.     Pupils: Pupils are equal, round, and reactive to light.  Cardiovascular:     Rate and Rhythm: Normal rate and regular rhythm.  Pulmonary:     Effort: Pulmonary effort is normal. No respiratory distress.     Breath sounds: Normal breath sounds. No stridor. No wheezing, rhonchi or rales.     Comments: Scant wheeze on forced exp (anteriorly)  No prolonged exp phase Musculoskeletal:     Cervical back: Normal range of motion and neck supple.  Lymphadenopathy:     Cervical: No cervical adenopathy.  Skin:    General: Skin is warm and dry.  Findings: No rash.  Neurological:     Mental Status: She is alert.     Cranial Nerves: No  cranial nerve deficit.  Psychiatric:        Mood and Affect: Mood normal.          Assessment & Plan:   Problem List Items Addressed This Visit       Respiratory   Acute bronchitis - Primary    Viral Also s/s of sinusitis and OM on R   Px prednisone (disc side eff) Fluids/rest and sympt care Tessalon for cough  augmentin for OM and sinus infection   Neg covid test today  Update if not starting to improve in a week or if worsening  ER precautions reviewed        Acute sinusitis    With uri/bronchitis and early OM R   augmentin px pred taper 30 mg Symptom ocntrol   Update if not starting to improve in a week or if worsening         Relevant Medications   amoxicillin-clavulanate (AUGMENTIN) 875-125 MG tablet   predniSONE (DELTASONE) 10 MG tablet   benzonatate (TESSALON) 200 MG capsule   Other Visit Diagnoses     Cough, unspecified type       Relevant Orders   POC COVID-19 (Completed)   Cough       Relevant Medications   albuterol (VENTOLIN HFA) 108 (90 Base) MCG/ACT inhaler   Other Relevant Orders   POC COVID-19 (Completed)

## 2022-01-26 NOTE — Assessment & Plan Note (Signed)
With uri/bronchitis and early OM R   augmentin px pred taper 30 mg Symptom ocntrol   Update if not starting to improve in a week or if worsening

## 2022-02-25 ENCOUNTER — Other Ambulatory Visit: Payer: Self-pay | Admitting: Family Medicine

## 2022-03-05 ENCOUNTER — Other Ambulatory Visit: Payer: Self-pay | Admitting: Family Medicine

## 2022-03-05 NOTE — Telephone Encounter (Signed)
Last filled 12/05/21 Last ov03/30/23

## 2022-03-30 ENCOUNTER — Telehealth: Payer: Self-pay | Admitting: Family Medicine

## 2022-03-30 NOTE — Telephone Encounter (Signed)
See note below access nurse note; pt declines ED disposition and will keep appt already scheduled with Dr Lorelei Pont on 03/31/22 at 3:20. Sending note to Dr Lorelei Pont and Butch Penny CMA.

## 2022-03-30 NOTE — Telephone Encounter (Signed)
[facility] - Client TELEPHONE ADVICE RECORD AccessNurse Patient Name: Carol Johnson Gender: Female DOB: 07-Nov-1965 Age: 56 Y 41 M 24 D Return Phone Number: [phone] (Primary) Address: City/ State/ Zip: [place]  [address] Client [facility] - Client Client Site [place] Provider Glori Bickers, Roque Lias - MD Contact Type Call Who Is Calling Patient / Member / Family / Caregiver Call Type Triage / Clinical Relationship To Patient Self Return Phone Number [phone] (Primary) Chief Complaint Walking difficulty Reason for Call Symptomatic / Request for [place] is having difficulty walking and pain in her left foot appointment scheduled tomorrow Translation No Nurse Assessment Nurse: Vallery Sa, RN, [name] Date/Time (Eastern Time): 03/30/2022 10:06:38 AM Confirm and document reason for call. If symptomatic, describe symptoms. ---Carol Johnson states she injured her left foot yesterday and has increased pain today (current pain rated as a 5-7 on the 1 to 10 scale). No fever. No bruising or swelling. Alert and responsive. Does the patient have any new or worsening symptoms? ---Yes Will a triage be completed? ---Yes Related visit to physician within the last 2 weeks? ---No Does the PT have any chronic conditions? (i.e. diabetes, asthma, this includes High risk factors for pregnancy, etc.) ---Yes List chronic conditions. ---High Blood Pressure, Stroke 3 years ago Is this a behavioral health or substance abuse call? ---No Guidelines Guideline Title Affirmed Question Affirmed Notes Nurse Date/Time [name] Time) Ankle and Foot Injury [1] Numbness (new loss of sensation) of toe(s) AND [2] present now May[place], RN, Cathy 03/30/2022 10:10:08 AM Disp. Time [name] Time) Disposition Final User 03/30/2022 9:29:17 AM Attempt made - message left Etter Sjogren 03/30/2022  9:36:55 AM Attempt made - no message left Alvis Lemmings, RN, Marcie Bal PLEASE NOTE: All timestamps contained within this report are represented as [place] Standard Time. CONFIDENTIALTY NOTICE: This fax transmission is intended only for the addressee. It contains information that is legally privileged, confidential or otherwise protected from use or disclosure. If you are not the intended recipient, you are strictly prohibited from reviewing, disclosing, copying using or disseminating any of this information or taking any action in reliance on or regarding this information. If you have received this fax in error, please notify us immediately by telephone so that we can arrange for its return to [place]. Phone: [phone], Toll-Free: [phone], Fax: [phone] Page: 2 of 2 Call Id: [id number] [email or web address]. Time [name] Time) Disposition Final User 03/30/2022 10:17:16 AM Go to ED Now Yes Vallery Sa, RN, [name] Final Disposition 03/30/2022 10:17:16 AM Go to ED Now Yes Vallery Sa, RN, [name] Disagree/Comply Disagree Caller Understands Yes PreDisposition Go to ED Care Advice Given Per Guideline GO TO ED NOW: * You need to be seen in the Emergency Department. * Go to the ED Carol ___________ [place] now. Drive carefully. NOTE TO TRIAGER - DRIVING: * Another adult should drive. * If there are any problems with automobile transport (e.Carol., unable to get to the car), then ambulance transport may be necessary. * The patient, caregiver, or family members can arrange ambulance transport via private ambulance company or via EMS 911. CARE ADVICE given per Foot and Ankle Injury (Adult) guideline. Comments User: Berton Mount, RN Date/Time [name] Time): 03/30/2022 10:19:48 AM Carol Johnson having increased numbness in foot after injury. No numbness on one side of the body, loss of speech or stroke symptoms concerns per [facility]. She declined the Go to ER disposition and wants to keep her  appointment Carol the office tomorrow. User:  Berton Mount, RN Date/Time [name] Time): 03/30/2022 10:23:25 AM Called the office backline and [facility] notified. He states he will notify Dr. Glori Bickers. Atianna notified. Referrals GO TO FACILITY REFUSE

## 2022-03-30 NOTE — Telephone Encounter (Signed)
Patient called and stated that she can barely walk and that she is pain is a 7 when she tries to walk. She has a appointment with Dr. Lorelei Pont on 03/31/2022 at 3:20 pm. Patient was sent to access nurse.

## 2022-03-30 NOTE — Telephone Encounter (Signed)
Access nurse called back and said they had advised patient to go to ER because she was having increased numbness in foot but patient declined and just sais she will see Dr Lorelei Pont tomorrow

## 2022-03-31 ENCOUNTER — Ambulatory Visit (INDEPENDENT_AMBULATORY_CARE_PROVIDER_SITE_OTHER)
Admission: RE | Admit: 2022-03-31 | Discharge: 2022-03-31 | Disposition: A | Discharge status: Home / Self Care | Payer: No Typology Code available for payment source | Source: Ambulatory Visit | Attending: Family Medicine | Admitting: Family Medicine

## 2022-03-31 ENCOUNTER — Encounter: Payer: Self-pay | Admitting: Family Medicine

## 2022-03-31 ENCOUNTER — Ambulatory Visit: Payer: No Typology Code available for payment source | Admitting: Family Medicine

## 2022-03-31 VITALS — BP 130/74 | HR 87 | Temp 97.8°F | Ht 64.0 in | Wt 248.2 lb

## 2022-03-31 DIAGNOSIS — M79672 Pain in left foot: Secondary | ICD-10-CM

## 2022-03-31 DIAGNOSIS — R2681 Unsteadiness on feet: Secondary | ICD-10-CM | POA: Diagnosis not present

## 2022-03-31 NOTE — Progress Notes (Signed)
Carol Johnson T. Rita Vialpando, MD, [place] [facility] [place], [address]  Phone: [phone]  FAX: [phone]  Carol Johnson - 56 y.o. female  MRN [id number]  Date of Birth: May 05, 1966  Date: 03/31/2022  PCP: Abner Greenspan, MD  Referral: Abner Greenspan, MD  Chief Complaint  Patient presents with   Foot Pain    Left-Jammed into tub on Monday   Subjective:   Carol Johnson is a 56 y.o. very pleasant female patient with Body mass index is 42.61 kg/m. who presents with the following:  Left foot pain as well as some numbness and tingling.  Pleasant young lady and she presents after she struck the forefoot of her foot onto a tub on Monday.  Subsequently, she has been limping she has some swelling in her foot and it has been tender to palpation.  There is minimal bruising at this point.  She denies pain in the toes, and pain is really more on the top of the distal foot as well as in the midfoot.  She denies any ankle pain, heel pain, tibia fibular pain. She did have some severe Achilles tendinopathy that has been ongoing for quite a long time, but this is doing much better compared to last time I saw her which was multiple months ago.  Review of Systems is noted in the HPI, as appropriate  Objective:   BP 130/74   Pulse 87   Temp 97.8 F (36.6 C) (Oral)   Ht 5' 4"  (1.626 m)   Wt 248 lb 4 oz (112.6 kg)   LMP 11/29/2015   SpO2 96%   BMI 42.61 kg/m   GEN: No acute distress; alert,appropriate. PULM: Breathing comfortably in no respiratory distress PSYCH: Normally interactive.   Mildly swollen left forefoot. Nontender to all phalanges.  She is tender in the distal first and second metatarsals.  Nontender in the third through fifth metatarsals.  Mildly tender to palpation at the first and second TMT joints as well as the remainder of the dorsal midfoot. She is not tender at all laterally, nontender at the base of  the fifth and nontender just proximal to this.  Nontender at the talus, calcaneus, medial malleolus, lateral malleolus, CFL, deltoid, or ATFL ligaments. Achilles is minimally to nontender.   Laboratory and Imaging Data:  Assessment and Plan:     ICD-10-CM   1. Acute foot pain, left  M79.672 DG Foot Complete Left    Ambulatory referral to Physical Therapy    2. Gait instability  R26.81 Ambulatory referral to Physical Therapy     XR, 3 view, AP, lateral, and oblique Indication: foot pain Findings: no evidence of acute fracture or dislocation.  There is an accessory bone lateral to the cuboid most c/w os peroneum.  Otherwise, the foot x-ray appears normal. Electronically Signed  By: Owens Loffler, MD On: 03/31/2022  3:20 PM EDT   History, examination consistent with bone contusion.  She is having a lot of difficulty walking right now, so I recommended that the patient go back in her cam walker boot for the next 7 to 10 days.  She is still having some gait instability status post her chronic Achilles tendinopathy, and although this is much improved, I do think she would do better with some gait training and formal physical therapy.  We will consult physical therapy, to begin in 2 to 3 weeks.  Social: Right now foot pain is  limiting her ability to ambulate, let alone maximally exercise.  Medication Management during today's office visit: No orders of the defined types were placed in this encounter.  There are no discontinued medications.  Orders placed today for conditions managed today: Orders Placed This Encounter  Procedures   DG Foot Complete Left   Ambulatory referral to Physical Therapy    Follow-up if needed: No follow-ups on file.  Dr[employer] One speech-to-text software was used for transcription in this dictation.  Possible transcriptional errors can occur using Editor, commissioning.   Signed,  Carol Johnson. Caasi Giglia, MD   Outpatient Encounter Medications as of  03/31/2022  Medication Sig   acetaminophen (TYLENOL) 325 MG tablet Take 2 tablets (650 mg total) by mouth every 4 (four) hours as needed for mild pain (or temp > 37.5 C (99.5 F)).   albuterol (PROVENTIL) (2.5 MG/3ML) 0.083% nebulizer solution Take 3 mLs (2.5 mg total) by nebulization every 6 (six) hours as needed for wheezing or shortness of breath.   albuterol (VENTOLIN HFA) 108 (90 Base) MCG/ACT inhaler INHALE 2 PUFFS INTO THE LUNGS EVERY 4 (FOUR) HOURS AS NEEDED FOR WHEEZING, COUGH, OR SHORTNESS OF BREATH   amoxicillin-clavulanate (AUGMENTIN) 875-125 MG tablet Take 1 tablet by mouth 2 (two) times daily.   atorvastatin (LIPITOR) 80 MG tablet TAKE 1 TABLET BY MOUTH DAILY AT 6 PM.   benzonatate (TESSALON) 200 MG capsule Take 1 capsule (200 mg total) by mouth 3 (three) times daily as needed. Do not bite pill   buPROPion (WELLBUTRIN XL) 300 MG 24 hr tablet TAKE 1 TABLET BY MOUTH EVERY DAY   citalopram (CELEXA) 20 MG tablet Take 1 tablet (20 mg total) by mouth at bedtime.   fexofenadine (ALLEGRA) 180 MG tablet Take 180 mg by mouth daily as needed for allergies.   fluticasone (FLONASE) 50 MCG/ACT nasal spray Place 2 sprays into both nostrils daily. (Patient taking differently: Place 2 sprays into both nostrils daily as needed.)   loperamide (IMODIUM) 2 MG capsule Take 1 capsule (2 mg total) by mouth as needed for diarrhea or loose stools. (Patient taking differently: Take 2 mg by mouth at bedtime.)   mesalamine (LIALDA) 1.2 g EC tablet TAKE 4 TABLETS BY MOUTH DAILY WITH BREAKFAST. (Patient taking differently: Take 4.8 g by mouth daily.)   pantoprazole (PROTONIX) 40 MG tablet TAKE 1 TABLET BY MOUTH EVERY DAY   predniSONE (DELTASONE) 10 MG tablet Take 3 pills once daily by mouth for 3 days, then 2 pills once daily for 3 days, then 1 pill once daily for 3 days and then stop   rivaroxaban (XARELTO) 20 MG TABS tablet TAKE 1 TABLET BY MOUTH DAILY WITH SUPPER.   traZODone (DESYREL) 50 MG tablet TAKE 1-2 TABLETS  BY MOUTH AT BEDTIME AS NEEDED FOR SLEEP.   vitamin B-12 (CYANOCOBALAMIN) 100 MCG tablet Take 1 tablet (100 mcg total) by mouth daily.   No facility-administered encounter medications on file as of 03/31/2022.

## 2022-04-19 ENCOUNTER — Encounter: Payer: Self-pay | Admitting: Family Medicine

## 2022-04-20 NOTE — Telephone Encounter (Signed)
Carol Johnson,   Can you please help her get set up with physical therapy.  I know that she had another issue a few months ago, and she never received a call about PT.  Thanks a lot! Electronically Signed  By: Owens Loffler, MD On: 04/20/2022 9:19 AM   Thanks for the heads up, [facility]

## 2022-05-03 ENCOUNTER — Other Ambulatory Visit: Payer: Self-pay | Admitting: Family Medicine

## 2022-05-04 NOTE — Telephone Encounter (Signed)
Last OV with PCP was on 01/26/22 for acute URI sxs, no future appts., last filled on 01/26/22 #180 tabs/ 0 refills

## 2022-05-06 ENCOUNTER — Encounter: Payer: Self-pay | Admitting: Family Medicine

## 2022-05-06 NOTE — Telephone Encounter (Signed)
error 

## 2022-05-06 NOTE — Telephone Encounter (Signed)
Patient called in stating that she was suppose to be set up on physical therapy. She hasn't heard anything

## 2022-05-25 ENCOUNTER — Other Ambulatory Visit: Payer: Self-pay | Admitting: Family Medicine

## 2022-05-27 ENCOUNTER — Ambulatory Visit: Payer: No Typology Code available for payment source | Admitting: Nurse Practitioner

## 2022-05-27 ENCOUNTER — Telehealth: Payer: Self-pay

## 2022-05-27 ENCOUNTER — Ambulatory Visit: Payer: No Typology Code available for payment source | Admitting: Family Medicine

## 2022-05-27 ENCOUNTER — Encounter: Payer: Self-pay | Admitting: Family Medicine

## 2022-05-27 VITALS — BP 130/82 | HR 61 | Temp 96.8°F | Resp 14 | Wt 226.0 lb

## 2022-05-27 DIAGNOSIS — J0110 Acute frontal sinusitis, unspecified: Secondary | ICD-10-CM

## 2022-05-27 MED ORDER — AMOXICILLIN-POT CLAVULANATE 875-125 MG PO TABS: 1.0000 | ORAL_TABLET | Freq: Two times a day (BID) | ORAL | 0 refills | Status: AC

## 2022-05-27 NOTE — Patient Instructions (Signed)
Nice to see you today I sent in an antibiotic to the pharmacy Continue using the [place].  STOP the Pseudofed

## 2022-05-27 NOTE — Progress Notes (Signed)
Established Patient Office Visit  Subjective   Patient ID: Carol Johnson, female    DOB: [birth year]  Age: 56 y.o. MRN: [id number]  Chief Complaint  Patient presents with   Sinus Problem    Sx started on 05/22/22-Sinus pain especially on the left side above and below eyes, ear pain, post nasal drip, runny nose-bloody when blowing it out, cough-especially at night. No fever. Has taking Sudafed. Covid test negative at home on 05/25/22.    Sinus Problem Associated symptoms include congestion, coughing (when she lays down), ear pain and headaches. Pertinent negatives include no chills, shortness of breath or sore throat.    Sinus problem:  Started on 05/22/2022  States that she was a [employer] to see her son for her parents we can Covid test at home was negative Tuesday morning  Has been taking Sudafed with some relief  Patient has a history of recurrent sinus infections.  She has been seen by ENT but not because of her sinuses.    Review of Systems  Constitutional:  Positive for malaise/fatigue. Negative for chills and fever.       Decreases appetitive Plenty of fluid   HENT:  Positive for congestion, ear pain and sinus pain. Negative for ear discharge and sore throat.   Respiratory:  Positive for cough (when she lays down). Negative for shortness of breath.   Neurological:  Positive for headaches.      Objective:     BP 130/82   Pulse 61   Temp (!) 96.8 F (36 C) (Temporal)   Resp 14   Wt 226 lb (102.5 kg)   LMP 11/29/2015   SpO2 98%   BMI 38.79 kg/m    Physical Exam Vitals and nursing note reviewed.  Constitutional:      Appearance: Normal appearance. She is obese.  HENT:     Right Ear: Tympanic membrane, ear canal and external ear normal.     Left Ear: Tympanic membrane, ear canal and external ear normal.     Nose:     Right Sinus: Maxillary sinus tenderness and frontal sinus tenderness present.     Left Sinus: Maxillary sinus tenderness and frontal sinus  tenderness present.     Mouth/Throat:     Mouth: Mucous membranes are moist.     Pharynx: Oropharynx is clear.  Cardiovascular:     Rate and Rhythm: Normal rate and regular rhythm.     Heart sounds: Normal heart sounds.  Pulmonary:     Breath sounds: Normal breath sounds.  Lymphadenopathy:     Cervical: No cervical adenopathy.  Neurological:     Mental Status: She is alert.      No results found for any visits on 05/27/22.    The ASCVD Risk score (Arnett DK, et al., 2019) failed to calculate for the following reasons:   The patient has a prior MI or stroke diagnosis    Assessment & Plan:   Problem List Items Addressed This Visit       Respiratory   Acute sinusitis - Primary    History of recurrent sinus infections.  Is followed by ENT for vertigo illness per patient report given patient's comorbidities length of illness and physical exam will like to treat with Augmentin.  Patient will discontinue Sudafed.  Continue use of Allegra and Flonase.  Plenty of rest and push fluids.  Follow-up if no improvement      Relevant Medications   amoxicillin-clavulanate (AUGMENTIN) 875-125 MG tablet  Return if symptoms worsen or fail to improve.    Romilda Garret, NP

## 2022-05-27 NOTE — Assessment & Plan Note (Signed)
History of recurrent sinus infections.  Is followed by ENT for vertigo illness per patient report given patient's comorbidities length of illness and physical exam will like to treat with Augmentin.  Patient will discontinue Sudafed.  Continue use of Allegra and Flonase.  Plenty of rest and push fluids.  Follow-up if no improvement

## 2022-05-27 NOTE — Telephone Encounter (Signed)
Pt was seen by Catalina Antigua, NP  FYI to PCP

## 2022-05-27 NOTE — Telephone Encounter (Signed)
Was advised that patient called office to talk with Dr. Glori Bickers. When advised that she would send message back to have them call patient responded with she would talk to her at her 11:30 appointment. Patient was in for appointment this morning as was well over the 10 minuet grace period. At that time she was offered a appointment at 11:30 with Dr. Glori Bickers but left without confirming that appointment time so it was not booked. I called patient to inform her of that. She was upset with our office for not putting her in that slot. Advised that she unfortunately left in a manner that we did not feel reflected that she wanted to be seen today. After several minutes of conversation Patient agreed to take appointment with [facility] today at 49. She has requested that I make sure that Dr. Glori Bickers is aware of how we have messed up today. Advised patient that I would send message to DR. Tower and if she had any further questions that our office would be in touch.

## 2022-06-02 ENCOUNTER — Ambulatory Visit: Payer: No Typology Code available for payment source | Attending: Family Medicine

## 2022-06-02 DIAGNOSIS — R262 Difficulty in walking, not elsewhere classified: Secondary | ICD-10-CM | POA: Insufficient documentation

## 2022-06-02 DIAGNOSIS — M6281 Muscle weakness (generalized): Secondary | ICD-10-CM | POA: Diagnosis present

## 2022-06-02 DIAGNOSIS — M79672 Pain in left foot: Secondary | ICD-10-CM | POA: Insufficient documentation

## 2022-06-02 DIAGNOSIS — R2681 Unsteadiness on feet: Secondary | ICD-10-CM | POA: Diagnosis not present

## 2022-06-02 NOTE — Therapy (Signed)
[facility] [address], [place], [address] Phone: [phone]   Fax:  [phone]  Physical Therapy Evaluation  Patient Details  Name: Carol Johnson MRN: [id number] Date of Birth: 05-26-1966 Referring Provider (PT): Owens Loffler, MD   Encounter Date: 06/02/2022   PT End of Session - 06/02/22 0942     Visit Number 1    Number of Visits 17    Date for PT Re-Evaluation 07/29/22    PT Start Time 1607   pt arrived late   PT Stop Time 1020    PT Time Calculation (min) 38 min    Equipment Utilized During Treatment Gait belt    Activity Tolerance Patient tolerated treatment well    Behavior During Therapy [facility] for tasks assessed/performed             Past Medical History:  Diagnosis Date   Abdominal pain, unspecified site    Allergic rhinitis    Anxiety    Asthma    Depression    DVT (deep venous thrombosis) ([place]) 2016   DVT of deep femoral vein, left ([place])    Fibroid uterus    GERD (gastroesophageal reflux disease)    Hypercholesterolemia    Hypertension    Internal hemorrhoid    Irritable bowel syndrome    Lymphocytic colitis 01/02/2015   Pneumonia 1998   Stroke ([place]) 2020   left sided weekness   Ulcerative colitis ([place])    URI (upper respiratory infection)    Uterine fibroid    Vitamin B12 deficiency     Past Surgical History:  Procedure Laterality Date   CESAREAN SECTION  08/30/2002   CHOLECYSTECTOMY     laparoscopic "gallstones"   ESOPHAGOGASTRODUODENOSCOPY (EGD) WITH PROPOFOL N/A 05/03/2014   Procedure: ESOPHAGOGASTRODUODENOSCOPY (EGD) WITH PROPOFOL;  Surgeon: Inda Castle, MD;  Location: [facility] ENDOSCOPY;  Service: Endoscopy;  Laterality: N/A;   UTERINE FIBROID SURGERY      There were no vitals filed for this visit.    Subjective Assessment - 06/02/22 0944     Subjective L heel: 0/10 currently (when walking). Not really having heel pain. Just the not walking properly.     Pertinent History L foot pain, gait instability. L heel pain. Feels like she is slapping her L foot as wall as walking with the inside of her foot (increased L foot pronation) when ambulating. Pain began last year during her son's football game. Had to wear a boot for 6 weeks afterwards. Has been not right since then but was also not walking correctly. Currently not walking the right way to protect her heel. L heel has a big huge lump in the back. Has not had really much pain. Wants to walk properly. Pt also bends over and looks down on the gound and that bothers her neck. Had PT for her CVA. 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 Has a fear of falling.    Patient Stated Goals Wants to walk properly. Improve going up and down stairs and keep head upright and not down.                [facility] PT Assessment - 06/02/22 0952       Assessment   Medical Diagnosis M79.672 (ICD-10-CM) - Acute foot pain, left  R26.81 (ICD-10-CM) - Gait instability    Referring Provider (PT) Owens Loffler, MD  Onset Date/Surgical Date 03/31/22    Prior Therapy yes for CVA      Precautions   Precaution Comments No known precautions      Restrictions   Other Position/Activity Restrictions No known restrictions      Balance Screen   Has the patient fallen in the past 6 months No    Has the patient had a decrease in activity level because of a fear of falling?  No   But states having a fear of falling.   Is the patient reluctant to leave their home because of a fear of falling?  No      Home Environment   Additional Comments Pt on the 2nd floor of an apartment building, has a hard time going downstairs. 15 steps to go to 2nd floor, B rails but only uses one rail. Pt lives a lone (Son is in college)      Mining engineer Comments B foot pronation L > R (bursitis L Achilles insertion), slight R lateral shift,  B protracted shoulders L > R, L shoulder lower, forward neck, movemebt preference around C6/7.      AROM   Left Ankle Dorsiflexion 7      Strength   Right Hip Flexion 4/5    Right Hip Extension 3-/5    Right Hip ABduction 3+/5    Left Hip Flexion 4-/5    Left Hip Extension 3-/5    Left Hip ABduction 3-/5    Right Knee Flexion 4/5    Right Knee Extension 5/5    Left Knee Flexion 4/5    Left Knee Extension 5/5    Left Ankle Plantar Flexion 4/5   seated manually resisted     Ambulation/Gait   Gait Comments antalgic, decreased stance L LE, L foot in ER      Dynamic Gait Index   Level Surface Mild Impairment    Change in Gait Speed Normal    Gait with Horizontal Head Turns Mild Impairment    Gait with Vertical Head Turns Mild Impairment    Gait and Pivot Turn Normal    Step Over Obstacle Moderate Impairment    Step Around Obstacles Normal    Steps Moderate Impairment   2 feet to a step when going down, per pt secondary to L knee pain.   Total Score 17    DGI comment: < 19 suggests increased risk for falls.                        Objective measurements completed on examination: See above findings.          Pt takes blood thinner for blood clots. Had CVA which affected both L UE and LE.     Works in [place], Available Monday, Thursday and Friday.     Response to treatment Pt tolerated session well without aggravation of symptoms.   Clinical impression Pt is a 56 year old female who came to physical therapy secondary to L foot pain and difficulty with gait. She also presents with B foot pronation L > R, B hip weakness, antalgic gait pattern, inflammation at the retrocalcaneal bursa area, limited L ankle DF AROM and difficulty performing tasks which involve ambulation and stair negotiation. Pt will benefit from skilled physical therapy services to address the aforementioned deficits.                 PT Short Term Goals - 06/02/22 1211  PT SHORT TERM GOAL #1   Title Pt will be independent with her initial HEP to decrease pain, improve strength, balance, and ability to ambulate and negotiate stairs with less difficulty.    Baseline Pt has not yet started her HEP (06/02/2022)    Time 3    Period Weeks    Status New    Target Date 06/24/22               PT Long Term Goals - 06/02/22 1212       PT LONG TERM GOAL #1   Title Pt will improve L ankle DF AROM to at least 15 degrees to improve heel strike to push off phase of gait.    Baseline L ankle DF AROM 7 degrees (06/02/2022)    Time 8    Period Weeks    Status New    Target Date 07/29/22      PT LONG TERM GOAL #2   Title Pt will improve her hip extension and abduction strength by at least 1/2 MMT to promote ability to ambulate and negotiate stairs with less difficulty.    Baseline Hip extension 3-/5 R and L, hip abduction 3+/5 R, 3-/5 L (06/02/2022)    Time 8    Period Weeks    Status New    Target Date 07/29/22      PT LONG TERM GOAL #3   Title Pt will improve her Foot FOTO score by at least 10 points as a demonstration of improved function.    Baseline Foot FOTO 49 (06/02/2022)    Time 8    Period Weeks    Status New    Target Date 07/29/22      PT LONG TERM GOAL #4   Title Pt will be able to negotiate 4 regular steps without UE assist, with a reciprocal gait pattern 3 times to decrease difficulty negotiating stairs to get to her second floor appartment.    Baseline One UE assist, ascending stairs, reciprocal pattern, but step to pattern with one UE assist descending stairs (06/02/2022)    Time 8    Period Weeks    Status New    Target Date 07/29/22                    Plan - 06/02/22 1200     Clinical Impression Statement Pt is a 56 year old female who came to physical therapy secondary to L foot pain and difficulty with gait. She also presents with B foot pronation L > R, B hip weakness, antalgic gait pattern, inflammation at the  retrocalcaneal bursa area, limited L ankle DF AROM and difficulty performing tasks which involve ambulation and stair negotiation. Pt will benefit from skilled physical therapy services to address the aforementioned deficits.    Personal Factors and Comorbidities Comorbidity 3+;Past/Current Experience;Fitness;Time since onset of injury/illness/exacerbation    Comorbidities Anxiety, depression, hx of DVT, HTN, hx of CVA affecting L UE and LE    Examination-Activity Limitations Stairs;Carry;Locomotion Level    Stability/Clinical Decision Making Stable/Uncomplicated    Clinical Decision Making Low    Rehab Potential Fair    PT Frequency 2x / week    PT Duration 8 weeks    PT Treatment/Interventions Electrical Stimulation;Iontophoresis 73m/ml Dexamethasone;Gait training;Stair training;Functional mobility training;Therapeutic activities;Therapeutic exercise;Balance training;Neuromuscular re-education;Patient/family education;Manual techniques;Dry needling    PT Next Visit Plan hip strengthening, ankle DF ROM. gastric strengthening (isometrics, concentrics, eccentrics), manual techniques, modalities PRN    Consulted and Agree  with Plan of Care Patient             Patient will benefit from skilled therapeutic intervention in order to improve the following deficits and impairments:  Pain, Postural dysfunction, Improper body mechanics, Difficulty walking, Decreased strength, Decreased range of motion, Abnormal gait  Visit Diagnosis: Pain in left foot - Plan: PT plan of care cert/re-cert  Muscle weakness (generalized) - Plan: PT plan of care cert/re-cert  Difficulty in walking, not elsewhere classified - Plan: PT plan of care cert/re-cert     Problem List Patient Active Problem List   Diagnosis Date Noted   Acute bronchitis 01/26/2022   Cellulitis 11/02/2021   Allergy to drug 11/02/2021   Bacterial skin infection 10/29/2021   COVID-19 09/29/2021   Cerebrovascular accident ([place])  01/12/2021   Chronic neck pain 01/12/2021   Gait abnormality 01/12/2021   Snores 01/12/2021   Vertigo 10/29/2020   Hearing loss 10/29/2020   Hypercoagulable state ([place]) 12/18/2019   Hair loss 12/03/2019   Aneurysm of anterior cerebral artery 12/03/2019   Abnormality of gait 09/04/2019   Hemiparesis affecting nondominant side as late effect of stroke ([place]) 07/24/2019   Muscle weakness (generalized) 06/11/2019   Hemiplegia and hemiparesis following cerebral infarction affecting left non-dominant side ([place]) 06/11/2019   Major depressive disorder    Hypokalemia    Acute blood loss anemia    Ulcerative colitis with complication ([place])    H/O: CVA (cerebrovascular accident) 05/23/2019   Thrombocytosis 05/23/2019   Coronary artery disease involving native coronary artery of native heart without angina pectoris 03/21/2018   Coronary artery calcification 02/10/2018   Obesity (BMI 30-39.9) 02/10/2018   Former smoker 01/15/2018   Dyspnea on exertion 12/21/2017   Carpal tunnel syndrome 12/21/2017   Stress incontinence 12/21/2017   Paresthesia of both feet 07/04/2017   Always thirsty 07/04/2017   Abdominal bloating 12/01/2015   Acute sinusitis 12/01/2015   Fatigue 12/01/2015   Vitamin B12 deficiency 12/01/2015   Lymphocytic colitis 01/16/2015   Anemia, iron deficiency 01/16/2015   Anxiety state 12/20/2014   Diarrhea 11/27/2014   Insomnia 11/27/2014   History of DVT (deep vein thrombosis) 09/17/2014   OSA (obstructive sleep apnea) 09/02/2014   Gastritis, chronic 06/06/2014   Unspecified gastritis and gastroduodenitis without mention of hemorrhage 05/03/2014   Abdominal pain, epigastric 04/19/2014   Internal hemorrhoids with other complication 09/98/3382   Routine general medical examination at a health care facility 10/14/2011   Thyroid nodule 05/16/2011   Rectal bleeding 11/24/2010   Hemorrhoids, internal 11/24/2010   Irritable bowel syndrome (IBS) 11/24/2010   ANEMIA 10/02/2009    Hyperlipidemia 07/18/2009   Essential hypertension 07/18/2009   DYSPNEA ON EXERTION 07/18/2009   THYROID CYST 01/31/2008   ANXIETY 01/31/2008   Depression with anxiety 01/31/2008   GASTRIC ULCER, HX OF 01/31/2008   FIBROIDS, UTERUS 11/13/2007   Allergic rhinitis 11/13/2007   Asthma 11/13/2007   GERD 11/13/2007   IRRITABLE BOWEL SYNDROME 11/13/2007   INTERNAL HEMORRHOIDS 11/12/2003   Joneen Boers PT, DPT  06/02/2022, 12:28 PM  [facility] [address] [place], [address] Phone: [phone]   Fax:  [phone]  Name: Carol Johnson MRN: [id number] Date of Birth: Aug 18, 1966

## 2022-06-07 ENCOUNTER — Ambulatory Visit: Payer: No Typology Code available for payment source

## 2022-06-07 DIAGNOSIS — R262 Difficulty in walking, not elsewhere classified: Secondary | ICD-10-CM

## 2022-06-07 DIAGNOSIS — M79672 Pain in left foot: Secondary | ICD-10-CM | POA: Diagnosis not present

## 2022-06-07 DIAGNOSIS — M6281 Muscle weakness (generalized): Secondary | ICD-10-CM

## 2022-06-07 NOTE — Therapy (Signed)
OUTPATIENT PHYSICAL THERAPY TREATMENT NOTE   Patient Name: Carol Johnson MRN: [id number] DOB:05/08/1966, 56 y.o., female Today's Date: 06/07/2022  PCP: Abner Greenspan, MD  REFERRING PROVIDER: Owens Loffler, MD   PT End of Session - 06/07/22 1427     Visit Number 2    Number of Visits 17    Date for PT Re-Evaluation 07/29/22    PT Start Time 1428   pt arrived late   PT Stop Time 1459    PT Time Calculation (min) 31 min    Equipment Utilized During Treatment Gait belt    Activity Tolerance Patient tolerated treatment well    Behavior During Therapy WFL for tasks assessed/performed             Past Medical History:  Diagnosis Date   Abdominal pain, unspecified site    Allergic rhinitis    Anxiety    Asthma    Depression    DVT (deep venous thrombosis) ([place]) 2016   DVT of deep femoral vein, left ([place])    Fibroid uterus    GERD (gastroesophageal reflux disease)    Hypercholesterolemia    Hypertension    Internal hemorrhoid    Irritable bowel syndrome    Lymphocytic colitis 01/02/2015   Pneumonia 1998   Stroke ([place]) 2020   left sided weekness   Ulcerative colitis ([place])    URI (upper respiratory infection)    Uterine fibroid    Vitamin B12 deficiency    Past Surgical History:  Procedure Laterality Date   CESAREAN SECTION  08/30/2002   CHOLECYSTECTOMY     laparoscopic "gallstones"   ESOPHAGOGASTRODUODENOSCOPY (EGD) WITH PROPOFOL N/A 05/03/2014   Procedure: ESOPHAGOGASTRODUODENOSCOPY (EGD) WITH PROPOFOL;  Surgeon: Inda Castle, MD;  Location: [facility];  Service: Endoscopy;  Laterality: N/A;   UTERINE FIBROID SURGERY     Patient Active Problem List   Diagnosis Date Noted   Acute bronchitis 01/26/2022   Cellulitis 11/02/2021   Allergy to drug 11/02/2021   Bacterial skin infection 10/29/2021   COVID-19 09/29/2021   Cerebrovascular accident ([place]) 01/12/2021   Chronic neck pain 01/12/2021   Gait abnormality 01/12/2021   Snores 01/12/2021   Vertigo  10/29/2020   Hearing loss 10/29/2020   Hypercoagulable state ([place]) 12/18/2019   Hair loss 12/03/2019   Aneurysm of anterior cerebral artery 12/03/2019   Abnormality of gait 09/04/2019   Hemiparesis affecting nondominant side as late effect of stroke ([place]) 07/24/2019   Muscle weakness (generalized) 06/11/2019   Hemiplegia and hemiparesis following cerebral infarction affecting left non-dominant side ([place]) 06/11/2019   Major depressive disorder    Hypokalemia    Acute blood loss anemia    Ulcerative colitis with complication ([place])    H/O: CVA (cerebrovascular accident) 05/23/2019   Thrombocytosis 05/23/2019   Coronary artery disease involving native coronary artery of native heart without angina pectoris 03/21/2018   Coronary artery calcification 02/10/2018   Obesity (BMI 30-39.9) 02/10/2018   Former smoker 01/15/2018   Dyspnea on exertion 12/21/2017   Carpal tunnel syndrome 12/21/2017   Stress incontinence 12/21/2017   Paresthesia of both feet 07/04/2017   Always thirsty 07/04/2017   Abdominal bloating 12/01/2015   Acute sinusitis 12/01/2015   Fatigue 12/01/2015   Vitamin B12 deficiency 12/01/2015   Lymphocytic colitis 01/16/2015   Anemia, iron deficiency 01/16/2015   Anxiety state 12/20/2014   Diarrhea 11/27/2014   Insomnia 11/27/2014   History of DVT (deep vein thrombosis) 09/17/2014   OSA (obstructive sleep apnea) 09/02/2014  Gastritis, chronic 06/06/2014   Unspecified gastritis and gastroduodenitis without mention of hemorrhage 05/03/2014   Abdominal pain, epigastric 04/19/2014   Internal hemorrhoids with other complication 20/35/5974   Routine general medical examination at a health care facility 10/14/2011   Thyroid nodule 05/16/2011   Rectal bleeding 11/24/2010   Hemorrhoids, internal 11/24/2010   Irritable bowel syndrome (IBS) 11/24/2010   ANEMIA 10/02/2009   Hyperlipidemia 07/18/2009   Essential hypertension 07/18/2009   DYSPNEA ON EXERTION 07/18/2009    THYROID CYST 01/31/2008   ANXIETY 01/31/2008   Depression with anxiety 01/31/2008   GASTRIC ULCER, HX OF 01/31/2008   FIBROIDS, UTERUS 11/13/2007   Allergic rhinitis 11/13/2007   Asthma 11/13/2007   GERD 11/13/2007   IRRITABLE BOWEL SYNDROME 11/13/2007   INTERNAL HEMORRHOIDS 11/12/2003    REFERRING DIAG: B63.845 (ICD-10-CM) - Acute foot pain, left  R26.81 (ICD-10-CM) - Gait instability   THERAPY DIAG:  Pain in left foot  Muscle weakness (generalized)  Difficulty in walking, not elsewhere classified  Rationale for Evaluation and Treatment Rehabilitation  PERTINENT HISTORY: L foot pain, gait instability. L heel pain. Feels like she is slapping her L foot as wall as walking with the inside of her foot (increased L foot pronation) when ambulating. Pain began last year during her son's football game. Had to wear a boot for 6 weeks afterwards. Has been not right since then but was also not walking correctly. Currently not walking the right way to protect her heel. L heel has a big huge lump in the back. Has not had really much pain. Wants to walk properly. Pt also bends over and looks down on the gound and that bothers her neck. Had PT for her CVA. 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 Has a fear of falling.   PRECAUTIONS: No known precautions   SUBJECTIVE: L foot does not hurt. Just awkward from walking the wrong way. Hard to keep her balance. Going down stairs are not good.   PAIN:  Are you having pain? See subjective     TODAY'S TREATMENT:   Therapeutic exercises  Seated manually resisted L ankle PF 10x  Standing static forward and backward weight shifting, tandem feet, emphasis on ankle DF for heel strike and PF for push off phases of gait.   R foot in front 10x3  L foot in back 10x3  Difficulty with motor planning L ankle   Standing B UE assist   Rocker board ankle DF/PF 2 minutes    Static mini lunge with contralateral UE assist PRN  R 10x  L 10x   Standing B heel raises with B UE assist 10x  L toes usually curls, does it often   Seated toe extensions L 10x5 seconds for 2 sets     Improved exercise technique, movement at target joints, use of target muscles after mod verbal, visual, tactile cues.         Response to treatment Pt tolerated session well without aggravation of symptoms.    Clinical impression  Pt arrived late so session was adjusted accordingly. Worked on improving L ankle heel strike, and push off to improve gait pattern. Unsteadiness and difficulty with motor planning observed with L LE with forward and backward weight shifting observed which may be residual from her CVA from 2020. Pt tolerated session well without aggravation of symptoms. Pt will benefit from continued skilled physical therapy services to  improve strength, balance, function, and ability to ambulate and negotiate stairs with less difficulty.        PATIENT EDUCATION: Education details: there-ex, HEP Person educated: Patient Education method: Explanation, Demonstration, Tactile cues, Verbal cues, and Handouts Education comprehension: verbalized understanding and returned demonstration   HOME EXERCISE PROGRAM: Access Code: [facility] URL: [email or web address] Date: 06/07/2022 Prepared by: Joneen Boers  Exercises - Seated Great Toe Extension  - 1 x daily - 7 x weekly - 3 sets - 10 reps - 5 seconds hold - Seated Lesser Toes Extension  - 1 x daily - 7 x weekly - 3 sets - 10 reps - 5 seconds hold   PT Short Term Goals - 06/02/22 1211       PT SHORT TERM GOAL #1   Title Pt will be independent with her initial HEP to decrease pain, improve strength, balance, and ability to ambulate and negotiate stairs with less difficulty.    Baseline Pt has not yet started her HEP (06/02/2022)    Time 3    Period Weeks    Status New    Target Date 06/24/22               PT Long Term Goals - 06/02/22 1212       PT LONG TERM GOAL #1   Title Pt will improve L ankle DF AROM to at least 15 degrees to improve heel strike to push off phase of gait.    Baseline L ankle DF AROM 7 degrees (06/02/2022)    Time 8    Period Weeks    Status New    Target Date 07/29/22      PT LONG TERM GOAL #2   Title Pt will improve her hip extension and abduction strength by at least 1/2 MMT to promote ability to ambulate and negotiate stairs with less difficulty.    Baseline Hip extension 3-/5 R and L, hip abduction 3+/5 R, 3-/5 L (06/02/2022)    Time 8    Period Weeks    Status New    Target Date 07/29/22      PT LONG TERM GOAL #3   Title Pt will improve her Foot FOTO score by at least 10 points as a demonstration of improved function.    Baseline Foot FOTO 49 (06/02/2022)    Time 8    Period Weeks    Status New    Target Date 07/29/22      PT LONG TERM GOAL #4   Title Pt will be able to negotiate 4 regular steps without UE assist, with a reciprocal gait pattern 3 times to decrease difficulty negotiating stairs to get to her second floor appartment.    Baseline One UE assist, ascending stairs, reciprocal pattern, but step to pattern with one UE assist descending stairs (06/02/2022)    Time 8    Period Weeks    Status New    Target Date 07/29/22              Plan - 06/07/22 1600     Clinical Impression Statement Pt arrived late so session was adjusted accordingly. Worked on improving L ankle heel strike, and push off to improve gait pattern. Unsteadiness and difficulty with motor planning observed with L LE with forward and backward weight shifting observed which may be residual from her CVA from 2020. Pt tolerated session well without aggravation of symptoms. Pt will benefit from continued skilled physical therapy services to improve strength, balance, function, and  ability to ambulate and negotiate stairs with less difficulty.    Personal Factors  and Comorbidities Comorbidity 3+;Past/Current Experience;Fitness;Time since onset of injury/illness/exacerbation    Comorbidities Anxiety, depression, hx of DVT, HTN, hx of CVA affecting L UE and LE    Examination-Activity Limitations Stairs;Carry;Locomotion Level    Stability/Clinical Decision Making Stable/Uncomplicated    Clinical Decision Making Low    Rehab Potential Fair    PT Frequency 2x / week    PT Duration 8 weeks    PT Treatment/Interventions Electrical Stimulation;Iontophoresis 58m/ml Dexamethasone;Gait training;Stair training;Functional mobility training;Therapeutic activities;Therapeutic exercise;Balance training;Neuromuscular re-education;Patient/family education;Manual techniques;Dry needling    PT Next Visit Plan hip strengthening, ankle DF ROM. gastric strengthening (isometrics, concentrics, eccentrics), manual techniques, modalities PRN    Consulted and Agree with Plan of Care Patient              MJoneen BoersPT, DPT  06/07/2022, 4:02 PM

## 2022-06-08 ENCOUNTER — Other Ambulatory Visit: Payer: Self-pay | Admitting: Family Medicine

## 2022-06-09 ENCOUNTER — Ambulatory Visit: Payer: No Typology Code available for payment source

## 2022-06-10 ENCOUNTER — Ambulatory Visit: Payer: No Typology Code available for payment source

## 2022-06-10 DIAGNOSIS — M6281 Muscle weakness (generalized): Secondary | ICD-10-CM

## 2022-06-10 DIAGNOSIS — M79672 Pain in left foot: Secondary | ICD-10-CM | POA: Diagnosis not present

## 2022-06-10 DIAGNOSIS — R262 Difficulty in walking, not elsewhere classified: Secondary | ICD-10-CM

## 2022-06-10 NOTE — Therapy (Signed)
OUTPATIENT PHYSICAL THERAPY TREATMENT NOTE   Patient Name: Carol Johnson MRN: [id number] DOB:06-Mar-1966, 56 y.o., female Today's Date: 06/10/2022  PCP: Abner Greenspan, MD  REFERRING PROVIDER: Owens Loffler, MD   PT End of Session - 06/10/22 1422     Visit Number 3    Number of Visits 17    Date for PT Re-Evaluation 07/29/22    PT Start Time 1421   pt arrived late   PT Stop Time 1501    PT Time Calculation (min) 40 min    Activity Tolerance Patient tolerated treatment well    Behavior During Therapy [facility] for tasks assessed/performed             Past Medical History:  Diagnosis Date   Abdominal pain, unspecified site    Allergic rhinitis    Anxiety    Asthma    Depression    DVT (deep venous thrombosis) ([place]) 2016   DVT of deep femoral vein, left ([place])    Fibroid uterus    GERD (gastroesophageal reflux disease)    Hypercholesterolemia    Hypertension    Internal hemorrhoid    Irritable bowel syndrome    Lymphocytic colitis 01/02/2015   Pneumonia 1998   Stroke ([place]) 2020   left sided weekness   Ulcerative colitis ([place])    URI (upper respiratory infection)    Uterine fibroid    Vitamin B12 deficiency    Past Surgical History:  Procedure Laterality Date   CESAREAN SECTION  08/30/2002   CHOLECYSTECTOMY     laparoscopic "gallstones"   ESOPHAGOGASTRODUODENOSCOPY (EGD) WITH PROPOFOL N/A 05/03/2014   Procedure: ESOPHAGOGASTRODUODENOSCOPY (EGD) WITH PROPOFOL;  Surgeon: Inda Castle, MD;  Location[facility];  Service: Endoscopy;  Laterality: N/A;   UTERINE FIBROID SURGERY     Patient Active Problem List   Diagnosis Date Noted   Acute bronchitis 01/26/2022   Cellulitis 11/02/2021   Allergy to drug 11/02/2021   Bacterial skin infection 10/29/2021   COVID-19 09/29/2021   Cerebrovascular accident ([place]) 01/12/2021   Chronic neck pain 01/12/2021   Gait abnormality 01/12/2021   Snores 01/12/2021   Vertigo 10/29/2020   Hearing loss 10/29/2020    Hypercoagulable state ([place]) 12/18/2019   Hair loss 12/03/2019   Aneurysm of anterior cerebral artery 12/03/2019   Abnormality of gait 09/04/2019   Hemiparesis affecting nondominant side as late effect of stroke ([place]) 07/24/2019   Muscle weakness (generalized) 06/11/2019   Hemiplegia and hemiparesis following cerebral infarction affecting left non-dominant side ([place]) 06/11/2019   Major depressive disorder    Hypokalemia    Acute blood loss anemia    Ulcerative colitis with complication ([place])    H/O: CVA (cerebrovascular accident) 05/23/2019   Thrombocytosis 05/23/2019   Coronary artery disease involving native coronary artery of native heart without angina pectoris 03/21/2018   Coronary artery calcification 02/10/2018   Obesity (BMI 30-39.9) 02/10/2018   Former smoker 01/15/2018   Dyspnea on exertion 12/21/2017   Carpal tunnel syndrome 12/21/2017   Stress incontinence 12/21/2017   Paresthesia of both feet 07/04/2017   Always thirsty 07/04/2017   Abdominal bloating 12/01/2015   Acute sinusitis 12/01/2015   Fatigue 12/01/2015   Vitamin B12 deficiency 12/01/2015   Lymphocytic colitis 01/16/2015   Anemia, iron deficiency 01/16/2015   Anxiety state 12/20/2014   Diarrhea 11/27/2014   Insomnia 11/27/2014   History of DVT (deep vein thrombosis) 09/17/2014   OSA (obstructive sleep apnea) 09/02/2014   Gastritis, chronic 06/06/2014   Unspecified gastritis and gastroduodenitis  without mention of hemorrhage 05/03/2014   Abdominal pain, epigastric 04/19/2014   Internal hemorrhoids with other complication 51/70/0174   Routine general medical examination at a health care facility 10/14/2011   Thyroid nodule 05/16/2011   Rectal bleeding 11/24/2010   Hemorrhoids, internal 11/24/2010   Irritable bowel syndrome (IBS) 11/24/2010   ANEMIA 10/02/2009   Hyperlipidemia 07/18/2009   Essential hypertension 07/18/2009   DYSPNEA ON EXERTION 07/18/2009   THYROID CYST 01/31/2008   ANXIETY 01/31/2008    Depression with anxiety 01/31/2008   GASTRIC ULCER, HX OF 01/31/2008   FIBROIDS, UTERUS 11/13/2007   Allergic rhinitis 11/13/2007   Asthma 11/13/2007   GERD 11/13/2007   IRRITABLE BOWEL SYNDROME 11/13/2007   INTERNAL HEMORRHOIDS 11/12/2003    REFERRING DIAG: B44.967 (ICD-10-CM) - Acute foot pain, left  R26.81 (ICD-10-CM) - Gait instability   THERAPY DIAG:  Pain in left foot  Muscle weakness (generalized)  Difficulty in walking, not elsewhere classified  Rationale for Evaluation and Treatment Rehabilitation  PERTINENT HISTORY: L foot pain, gait instability. L heel pain. Feels like she is slapping her L foot as wall as walking with the inside of her foot (increased L foot pronation) when ambulating. Pain began last year during her son's football game. Had to wear a boot for 6 weeks afterwards. Has been not right since then but was also not walking correctly. Currently not walking the right way to protect her heel. L heel has a big huge lump in the back. Has not had really much pain. Wants to walk properly. Pt also bends over and looks down on the gound and that bothers her neck. Had PT for her CVA. 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 Has a fear of falling.   PRECAUTIONS: No known precautions   SUBJECTIVE: No pain currently. Brought her flip flops so she can show what her L toes do.   PAIN:  Are you having pain? See subjective     TODAY'S TREATMENT:   Therapeutic exercises  Standing B heel raises with B UE assist 10x2  L toes usually curls slightly  Seated L ankle DF red band 10x2  Wall gastroc stretch L 30 seconds x 3  Standing B UE assist   Rocker board ankle DF/PF 2 minutes   Standing static forward and backward weight shifting, tandem feet, emphasis on ankle DF for heel strike   L foot in front 10x3     Improved exercise technique, movement at target joints, use of target  muscles after mod verbal, visual, tactile cues.    Manual therapy Seated STM L plantar foot to improve mobility and manual toe (digits 4-5) extension stretch  Decreased L toe pain with heel raises       Response to treatment Pt tolerated session well without aggravation of symptoms.    Clinical impression  Worked on improving ankle DF ROM and DF/PF strength as well as decreasing fascial restrictions plantar foot and improving toe extension to promote heel strike and push off during gait. Pt tolerated session well without aggravation of symptoms. Pt will benefit from continued skilled physical therapy services to improve strength, balance, function, and ability to ambulate and negotiate stairs with less difficulty.        PATIENT EDUCATION: Education details: there-ex, HEP Person educated: Patient Education method: Explanation, Demonstration, Tactile cues, Verbal cues, and Handouts Education comprehension: verbalized understanding and returned demonstration   HOME  EXERCISE PROGRAM: Access Code: [facility] URL: [email or web address] Date: 06/07/2022 Prepared by: Joneen Boers  Exercises - Seated Great Toe Extension  - 1 x daily - 7 x weekly - 3 sets - 10 reps - 5 seconds hold - Seated Lesser Toes Extension  - 1 x daily - 7 x weekly - 3 sets - 10 reps - 5 seconds hold  - Ankle Dorsiflexion with Resistance  - 1 x daily - 7 x weekly - 3 sets - 10 reps - Gastroc Stretch on Wall  - 2 x daily - 7 x weekly - 1 sets - 3 reps - 30 seconds hold    PT Short Term Goals - 06/02/22 1211       PT SHORT TERM GOAL #1   Title Pt will be independent with her initial HEP to decrease pain, improve strength, balance, and ability to ambulate and negotiate stairs with less difficulty.    Baseline Pt has not yet started her HEP (06/02/2022)    Time 3    Period Weeks    Status New    Target Date 06/24/22              PT Long Term Goals - 06/02/22 1212       PT LONG TERM  GOAL #1   Title Pt will improve L ankle DF AROM to at least 15 degrees to improve heel strike to push off phase of gait.    Baseline L ankle DF AROM 7 degrees (06/02/2022)    Time 8    Period Weeks    Status New    Target Date 07/29/22      PT LONG TERM GOAL #2   Title Pt will improve her hip extension and abduction strength by at least 1/2 MMT to promote ability to ambulate and negotiate stairs with less difficulty.    Baseline Hip extension 3-/5 R and L, hip abduction 3+/5 R, 3-/5 L (06/02/2022)    Time 8    Period Weeks    Status New    Target Date 07/29/22      PT LONG TERM GOAL #3   Title Pt will improve her Foot FOTO score by at least 10 points as a demonstration of improved function.    Baseline Foot FOTO 49 (06/02/2022)    Time 8    Period Weeks    Status New    Target Date 07/29/22      PT LONG TERM GOAL #4   Title Pt will be able to negotiate 4 regular steps without UE assist, with a reciprocal gait pattern 3 times to decrease difficulty negotiating stairs to get to her second floor appartment.    Baseline One UE assist, ascending stairs, reciprocal pattern, but step to pattern with one UE assist descending stairs (06/02/2022)    Time 8    Period Weeks    Status New    Target Date 07/29/22              Plan - 06/10/22 1606     Clinical Impression Statement Worked on improving ankle DF ROM and DF/PF strength as well as decreasing fascial restrictions plantar foot and improving toe extension to promote heel strike and push off during gait. Pt tolerated session well without aggravation of symptoms. Pt will benefit from continued skilled physical therapy services to improve strength, balance, function, and ability to ambulate and negotiate stairs with less difficulty.    Personal Factors and Comorbidities Comorbidity 3+;Past/Current Experience;Fitness;Time since onset of  injury/illness/exacerbation    Comorbidities Anxiety, depression, hx of DVT, HTN, hx of CVA affecting L  UE and LE    Examination-Activity Limitations Stairs;Carry;Locomotion Level    Stability/Clinical Decision Making Stable/Uncomplicated    Clinical Decision Making Low    Rehab Potential Fair    PT Frequency 2x / week    PT Duration 8 weeks    PT Treatment/Interventions Electrical Stimulation;Iontophoresis 53m/ml Dexamethasone;Gait training;Stair training;Functional mobility training;Therapeutic activities;Therapeutic exercise;Balance training;Neuromuscular re-education;Patient/family education;Manual techniques;Dry needling    PT Next Visit Plan hip strengthening, ankle DF ROM. gastric strengthening (isometrics, concentrics, eccentrics), manual techniques, modalities PRN    Consulted and Agree with Plan of Care Patient               MJoneen BoersPT, DPT  06/10/2022, 4:10 PM

## 2022-06-14 ENCOUNTER — Ambulatory Visit: Payer: No Typology Code available for payment source

## 2022-06-14 DIAGNOSIS — M6281 Muscle weakness (generalized): Secondary | ICD-10-CM

## 2022-06-14 DIAGNOSIS — M79672 Pain in left foot: Secondary | ICD-10-CM | POA: Diagnosis not present

## 2022-06-14 DIAGNOSIS — R262 Difficulty in walking, not elsewhere classified: Secondary | ICD-10-CM

## 2022-06-14 NOTE — Therapy (Signed)
OUTPATIENT PHYSICAL THERAPY TREATMENT NOTE   Patient Name: Carol Johnson MRN: [id number] DOB:09-Jan-1966, 56 y.o., female Today's Date: 06/14/2022  PCP: Abner Greenspan, MD  REFERRING PROVIDER: Owens Loffler, MD   PT End of Session - 06/14/22 1637     Visit Number 4    Number of Visits 17    Date for PT Re-Evaluation 07/29/22    PT Start Time 1637   pt arrived late   PT Stop Time 1716    PT Time Calculation (min) 39 min    Activity Tolerance Patient tolerated treatment well    Behavior During Therapy [facility] for tasks assessed/performed              Past Medical History:  Diagnosis Date   Abdominal pain, unspecified site    Allergic rhinitis    Anxiety    Asthma    Depression    DVT (deep venous thrombosis) ([place]) 2016   DVT of deep femoral vein, left ([place])    Fibroid uterus    GERD (gastroesophageal reflux disease)    Hypercholesterolemia    Hypertension    Internal hemorrhoid    Irritable bowel syndrome    Lymphocytic colitis 01/02/2015   Pneumonia 1998   Stroke ([place]) 2020   left sided weekness   Ulcerative colitis ([place])    URI (upper respiratory infection)    Uterine fibroid    Vitamin B12 deficiency    Past Surgical History:  Procedure Laterality Date   CESAREAN SECTION  08/30/2002   CHOLECYSTECTOMY     laparoscopic "gallstones"   ESOPHAGOGASTRODUODENOSCOPY (EGD) WITH PROPOFOL N/A 05/03/2014   Procedure: ESOPHAGOGASTRODUODENOSCOPY (EGD) WITH PROPOFOL;  Surgeon: Inda Castle, MD;  Location: [facility];  Service: Endoscopy;  Laterality: N/A;   UTERINE FIBROID SURGERY     Patient Active Problem List   Diagnosis Date Noted   Acute bronchitis 01/26/2022   Cellulitis 11/02/2021   Allergy to drug 11/02/2021   Bacterial skin infection 10/29/2021   COVID-19 09/29/2021   Cerebrovascular accident ([place]) 01/12/2021   Chronic neck pain 01/12/2021   Gait abnormality 01/12/2021   Snores 01/12/2021   Vertigo 10/29/2020   Hearing loss 10/29/2020    Hypercoagulable state ([place]) 12/18/2019   Hair loss 12/03/2019   Aneurysm of anterior cerebral artery 12/03/2019   Abnormality of gait 09/04/2019   Hemiparesis affecting nondominant side as late effect of stroke ([place]) 07/24/2019   Muscle weakness (generalized) 06/11/2019   Hemiplegia and hemiparesis following cerebral infarction affecting left non-dominant side ([place]) 06/11/2019   Major depressive disorder    Hypokalemia    Acute blood loss anemia    Ulcerative colitis with complication ([place])    H/O: CVA (cerebrovascular accident) 05/23/2019   Thrombocytosis 05/23/2019   Coronary artery disease involving native coronary artery of native heart without angina pectoris 03/21/2018   Coronary artery calcification 02/10/2018   Obesity (BMI 30-39.9) 02/10/2018   Former smoker 01/15/2018   Dyspnea on exertion 12/21/2017   Carpal tunnel syndrome 12/21/2017   Stress incontinence 12/21/2017   Paresthesia of both feet 07/04/2017   Always thirsty 07/04/2017   Abdominal bloating 12/01/2015   Acute sinusitis 12/01/2015   Fatigue 12/01/2015   Vitamin B12 deficiency 12/01/2015   Lymphocytic colitis 01/16/2015   Anemia, iron deficiency 01/16/2015   Anxiety state 12/20/2014   Diarrhea 11/27/2014   Insomnia 11/27/2014   History of DVT (deep vein thrombosis) 09/17/2014   OSA (obstructive sleep apnea) 09/02/2014   Gastritis, chronic 06/06/2014   Unspecified gastritis and  gastroduodenitis without mention of hemorrhage 05/03/2014   Abdominal pain, epigastric 04/19/2014   Internal hemorrhoids with other complication 67/20/9470   Routine general medical examination at a health care facility 10/14/2011   Thyroid nodule 05/16/2011   Rectal bleeding 11/24/2010   Hemorrhoids, internal 11/24/2010   Irritable bowel syndrome (IBS) 11/24/2010   ANEMIA 10/02/2009   Hyperlipidemia 07/18/2009   Essential hypertension 07/18/2009   DYSPNEA ON EXERTION 07/18/2009   THYROID CYST 01/31/2008   ANXIETY 01/31/2008    Depression with anxiety 01/31/2008   GASTRIC ULCER, HX OF 01/31/2008   FIBROIDS, UTERUS 11/13/2007   Allergic rhinitis 11/13/2007   Asthma 11/13/2007   GERD 11/13/2007   IRRITABLE BOWEL SYNDROME 11/13/2007   INTERNAL HEMORRHOIDS 11/12/2003    REFERRING DIAG: J62.836 (ICD-10-CM) - Acute foot pain, left  R26.81 (ICD-10-CM) - Gait instability   THERAPY DIAG:  Pain in left foot  Muscle weakness (generalized)  Difficulty in walking, not elsewhere classified  Rationale for Evaluation and Treatment Rehabilitation  PERTINENT HISTORY: L foot pain, gait instability. L heel pain. Feels like she is slapping her L foot as wall as walking with the inside of her foot (increased L foot pronation) when ambulating. Pain began last year during her son's football game. Had to wear a boot for 6 weeks afterwards. Has been not right since then but was also not walking correctly. Currently not walking the right way to protect her heel. L heel has a big huge lump in the back. Has not had really much pain. Wants to walk properly. Pt also bends over and looks down on the gound and that bothers her neck. Had PT for her CVA. 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 Has a fear of falling.   PRECAUTIONS: No known precautions   SUBJECTIVE: Doing ok. Walking is ok.    PAIN:  Are you having pain? See subjective     TODAY'S TREATMENT:    Manual therapy  Seated STM L plantar foot to improve mobility and manual toe (digits 4-5) extension stretch    Therapeutic exercises  Standing B heel raises with B UE assist 10x3   Standing gastroc stretch L 30 seconds x 3  Standing static forward and backward weight shifting, tandem feet, emphasis on ankle DF for heel strike   L foot in front 10x3    R foot in front 10x3    Gait with heel strike and push off L foot 100 ft, then 200 ft  Standing with B UE assist   Hip  abduction    R 10x5 seconds, then 10x2   L 10x5 seconds, then 10x2  Standing march with hip extension, L UE assist  L 10x3  Gait with emphasis on L heel strike after session when leaving the clinic.       Improved exercise technique, movement at target joints, use of target muscles after mod verbal, visual, tactile cues.         Response to treatment Pt tolerated session well without aggravation of symptoms.    Clinical impression  Emphasized heel strike and push off to promote more normal gait pattern. Worked on improving ankle DF ROM and DF/PF strength as well as decreasing fascial restrictions plantar foot and improving toe extension to promote heel strike and push off during gait. Pt tolerated session well without aggravation of symptoms. Pt will benefit from continued skilled physical therapy services to  improve strength, balance, function, and ability to ambulate and negotiate stairs with less difficulty.        PATIENT EDUCATION: Education details: there-ex, HEP Person educated: Patient Education method: Explanation, Demonstration, Tactile cues, Verbal cues, and Handouts Education comprehension: verbalized understanding and returned demonstration   HOME EXERCISE PROGRAM: Access Code: [facility] URL: [email or web address] Date: 06/07/2022 Prepared by: Joneen Boers  Exercises - Seated Great Toe Extension  - 1 x daily - 7 x weekly - 3 sets - 10 reps - 5 seconds hold - Seated Lesser Toes Extension  - 1 x daily - 7 x weekly - 3 sets - 10 reps - 5 seconds hold  - Ankle Dorsiflexion with Resistance  - 1 x daily - 7 x weekly - 3 sets - 10 reps - Gastroc Stretch on Wall  - 2 x daily - 7 x weekly - 1 sets - 3 reps - 30 seconds hold    PT Short Term Goals - 06/02/22 1211       PT SHORT TERM GOAL #1   Title Pt will be independent with her initial HEP to decrease pain, improve strength, balance, and ability to ambulate and negotiate stairs with less  difficulty.    Baseline Pt has not yet started her HEP (06/02/2022)    Time 3    Period Weeks    Status New    Target Date 06/24/22              PT Long Term Goals - 06/02/22 1212       PT LONG TERM GOAL #1   Title Pt will improve L ankle DF AROM to at least 15 degrees to improve heel strike to push off phase of gait.    Baseline L ankle DF AROM 7 degrees (06/02/2022)    Time 8    Period Weeks    Status New    Target Date 07/29/22      PT LONG TERM GOAL #2   Title Pt will improve her hip extension and abduction strength by at least 1/2 MMT to promote ability to ambulate and negotiate stairs with less difficulty.    Baseline Hip extension 3-/5 R and L, hip abduction 3+/5 R, 3-/5 L (06/02/2022)    Time 8    Period Weeks    Status New    Target Date 07/29/22      PT LONG TERM GOAL #3   Title Pt will improve her Foot FOTO score by at least 10 points as a demonstration of improved function.    Baseline Foot FOTO 49 (06/02/2022)    Time 8    Period Weeks    Status New    Target Date 07/29/22      PT LONG TERM GOAL #4   Title Pt will be able to negotiate 4 regular steps without UE assist, with a reciprocal gait pattern 3 times to decrease difficulty negotiating stairs to get to her second floor appartment.    Baseline One UE assist, ascending stairs, reciprocal pattern, but step to pattern with one UE assist descending stairs (06/02/2022)    Time 8    Period Weeks    Status New    Target Date 07/29/22              Plan - 06/14/22 1636     Clinical Impression Statement Emphasized heel strike and push off to promote more normal gait pattern. Worked on improving ankle DF ROM and DF/PF strength as well as decreasing  fascial restrictions plantar foot and improving toe extension to promote heel strike and push off during gait. Pt tolerated session well without aggravation of symptoms. Pt will benefit from continued skilled physical therapy services to improve strength, balance,  function, and ability to ambulate and negotiate stairs with less difficulty.    Personal Factors and Comorbidities Comorbidity 3+;Past/Current Experience;Fitness;Time since onset of injury/illness/exacerbation    Comorbidities Anxiety, depression, hx of DVT, HTN, hx of CVA affecting L UE and LE    Examination-Activity Limitations Stairs;Carry;Locomotion Level    Stability/Clinical Decision Making Stable/Uncomplicated    Rehab Potential Fair    PT Frequency 2x / week    PT Duration 8 weeks    PT Treatment/Interventions Electrical Stimulation;Iontophoresis 50m/ml Dexamethasone;Gait training;Stair training;Functional mobility training;Therapeutic activities;Therapeutic exercise;Balance training;Neuromuscular re-education;Patient/family education;Manual techniques;Dry needling    PT Next Visit Plan hip strengthening, ankle DF ROM. gastric strengthening (isometrics, concentrics, eccentrics), manual techniques, modalities PRN    Consulted and Agree with Plan of Care Patient               MJoneen BoersPT, DPT  06/14/2022, 6:21 PM

## 2022-06-17 ENCOUNTER — Ambulatory Visit: Payer: No Typology Code available for payment source

## 2022-06-17 DIAGNOSIS — M79672 Pain in left foot: Secondary | ICD-10-CM

## 2022-06-17 DIAGNOSIS — M6281 Muscle weakness (generalized): Secondary | ICD-10-CM

## 2022-06-17 DIAGNOSIS — R262 Difficulty in walking, not elsewhere classified: Secondary | ICD-10-CM

## 2022-06-17 NOTE — Therapy (Signed)
OUTPATIENT PHYSICAL THERAPY TREATMENT NOTE   Patient Name: Carol Johnson MRN: [id number] DOB:1965-12-18, 56 y.o., female Today's Date: 06/17/2022  PCP: Abner Greenspan, MD  REFERRING PROVIDER: Owens Loffler, MD   PT End of Session - 06/17/22 1555     Visit Number 5    Number of Visits 17    Date for PT Re-Evaluation 07/29/22    PT Start Time 2035    PT Stop Time 5974    PT Time Calculation (min) 40 min    Activity Tolerance Patient tolerated treatment well    Behavior During Therapy [facility] for tasks assessed/performed               Past Medical History:  Diagnosis Date   Abdominal pain, unspecified site    Allergic rhinitis    Anxiety    Asthma    Depression    DVT (deep venous thrombosis) ([place]) 2016   DVT of deep femoral vein, left ([place])    Fibroid uterus    GERD (gastroesophageal reflux disease)    Hypercholesterolemia    Hypertension    Internal hemorrhoid    Irritable bowel syndrome    Lymphocytic colitis 01/02/2015   Pneumonia 1998   Stroke ([place]) 2020   left sided weekness   Ulcerative colitis ([place])    URI (upper respiratory infection)    Uterine fibroid    Vitamin B12 deficiency    Past Surgical History:  Procedure Laterality Date   CESAREAN SECTION  08/30/2002   CHOLECYSTECTOMY     laparoscopic "gallstones"   ESOPHAGOGASTRODUODENOSCOPY (EGD) WITH PROPOFOL N/A 05/03/2014   Procedure: ESOPHAGOGASTRODUODENOSCOPY (EGD) WITH PROPOFOL;  Surgeon: Inda Castle, MD;  Location: [facility];  Service: Endoscopy;  Laterality: N/A;   UTERINE FIBROID SURGERY     Patient Active Problem List   Diagnosis Date Noted   Acute bronchitis 01/26/2022   Cellulitis 11/02/2021   Allergy to drug 11/02/2021   Bacterial skin infection 10/29/2021   COVID-19 09/29/2021   Cerebrovascular accident ([place]) 01/12/2021   Chronic neck pain 01/12/2021   Gait abnormality 01/12/2021   Snores 01/12/2021   Vertigo 10/29/2020   Hearing loss 10/29/2020   Hypercoagulable state  ([place]) 12/18/2019   Hair loss 12/03/2019   Aneurysm of anterior cerebral artery 12/03/2019   Abnormality of gait 09/04/2019   Hemiparesis affecting nondominant side as late effect of stroke ([place]) 07/24/2019   Muscle weakness (generalized) 06/11/2019   Hemiplegia and hemiparesis following cerebral infarction affecting left non-dominant side ([place]) 06/11/2019   Major depressive disorder    Hypokalemia    Acute blood loss anemia    Ulcerative colitis with complication ([place])    H/O: CVA (cerebrovascular accident) 05/23/2019   Thrombocytosis 05/23/2019   Coronary artery disease involving native coronary artery of native heart without angina pectoris 03/21/2018   Coronary artery calcification 02/10/2018   Obesity (BMI 30-39.9) 02/10/2018   Former smoker 01/15/2018   Dyspnea on exertion 12/21/2017   Carpal tunnel syndrome 12/21/2017   Stress incontinence 12/21/2017   Paresthesia of both feet 07/04/2017   Always thirsty 07/04/2017   Abdominal bloating 12/01/2015   Acute sinusitis 12/01/2015   Fatigue 12/01/2015   Vitamin B12 deficiency 12/01/2015   Lymphocytic colitis 01/16/2015   Anemia, iron deficiency 01/16/2015   Anxiety state 12/20/2014   Diarrhea 11/27/2014   Insomnia 11/27/2014   History of DVT (deep vein thrombosis) 09/17/2014   OSA (obstructive sleep apnea) 09/02/2014   Gastritis, chronic 06/06/2014   Unspecified gastritis and gastroduodenitis without mention  of hemorrhage 05/03/2014   Abdominal pain, epigastric 04/19/2014   Internal hemorrhoids with other complication 93/81/8299   Routine general medical examination at a health care facility 10/14/2011   Thyroid nodule 05/16/2011   Rectal bleeding 11/24/2010   Hemorrhoids, internal 11/24/2010   Irritable bowel syndrome (IBS) 11/24/2010   ANEMIA 10/02/2009   Hyperlipidemia 07/18/2009   Essential hypertension 07/18/2009   DYSPNEA ON EXERTION 07/18/2009   THYROID CYST 01/31/2008   ANXIETY 01/31/2008   Depression with  anxiety 01/31/2008   GASTRIC ULCER, HX OF 01/31/2008   FIBROIDS, UTERUS 11/13/2007   Allergic rhinitis 11/13/2007   Asthma 11/13/2007   GERD 11/13/2007   IRRITABLE BOWEL SYNDROME 11/13/2007   INTERNAL HEMORRHOIDS 11/12/2003    REFERRING DIAG: B71.696 (ICD-10-CM) - Acute foot pain, left  R26.81 (ICD-10-CM) - Gait instability   THERAPY DIAG:  Pain in left foot  Muscle weakness (generalized)  Difficulty in walking, not elsewhere classified  Rationale for Evaluation and Treatment Rehabilitation  PERTINENT HISTORY: L foot pain, gait instability. L heel pain. Feels like she is slapping her L foot as wall as walking with the inside of her foot (increased L foot pronation) when ambulating. Pain began last year during her son's football game. Had to wear a boot for 6 weeks afterwards. Has been not right since then but was also not walking correctly. Currently not walking the right way to protect her heel. L heel has a big huge lump in the back. Has not had really much pain. Wants to walk properly. Pt also bends over and looks down on the gound and that bothers her neck. Had PT for her CVA. 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 Has a fear of falling.   PRECAUTIONS: No known precautions   SUBJECTIVE: Doing ok. No pain currently. Had a long day at work. Walking is a little better.   PAIN:  Are you having pain? See subjective     TODAY'S TREATMENT:    Manual therapy Seated STM L plantar foot to improve mobility and manual toe (digits 4-5) extension stretch    Therapeutic exercises   Standing L LE D2 flexion and extension with B UE assist   L 10x3  Eccentric heel lowering B at first stair step with B UE assist, pain free range  10x2  Standing gastroc stretch at first stair step with B UE assist  30 seconds x 3  Gait with heel strike and push off L foot 300 ft,  Gait with R and L head turns  32 ft 6x  Stepping onto and over Air Ex pat with one UE assist PRN  R 10x2  L 10x2    Improved exercise technique, movement at target joints, use of target muscles after mod verbal, visual, tactile cues.         Response to treatment Pt tolerated session well without aggravation of symptoms.    Clinical impression   Worked on improving gastroc strength and flexibility to promote heel strike and push off phase of gait L LE. Improving gait pattern after cues and practice. Able to ambulate with R and L horizontal head turns without looking at her feet and no LOB. Pt tolerated session well without aggravation of symptoms. Pt will benefit from continued skilled physical therapy services to improve strength, balance, function, and ability to ambulate and negotiate stairs with less difficulty.  PATIENT EDUCATION: Education details: there-ex, HEP Person educated: Patient Education method: Explanation, Demonstration, Tactile cues, Verbal cues, and Handouts Education comprehension: verbalized understanding and returned demonstration   HOME EXERCISE PROGRAM: Access Code: [facility] URL: [email or web address] Date: 06/07/2022 Prepared by: Joneen Boers  Exercises - Seated Great Toe Extension  - 1 x daily - 7 x weekly - 3 sets - 10 reps - 5 seconds hold - Seated Lesser Toes Extension  - 1 x daily - 7 x weekly - 3 sets - 10 reps - 5 seconds hold  - Ankle Dorsiflexion with Resistance  - 1 x daily - 7 x weekly - 3 sets - 10 reps - Gastroc Stretch on Wall  - 2 x daily - 7 x weekly - 1 sets - 3 reps - 30 seconds hold    PT Short Term Goals - 06/02/22 1211       PT SHORT TERM GOAL #1   Title Pt will be independent with her initial HEP to decrease pain, improve strength, balance, and ability to ambulate and negotiate stairs with less difficulty.    Baseline Pt has not yet started her HEP (06/02/2022)    Time 3    Period Weeks    Status New    Target Date 06/24/22               PT Long Term Goals - 06/02/22 1212       PT LONG TERM GOAL #1   Title Pt will improve L ankle DF AROM to at least 15 degrees to improve heel strike to push off phase of gait.    Baseline L ankle DF AROM 7 degrees (06/02/2022)    Time 8    Period Weeks    Status New    Target Date 07/29/22      PT LONG TERM GOAL #2   Title Pt will improve her hip extension and abduction strength by at least 1/2 MMT to promote ability to ambulate and negotiate stairs with less difficulty.    Baseline Hip extension 3-/5 R and L, hip abduction 3+/5 R, 3-/5 L (06/02/2022)    Time 8    Period Weeks    Status New    Target Date 07/29/22      PT LONG TERM GOAL #3   Title Pt will improve her Foot FOTO score by at least 10 points as a demonstration of improved function.    Baseline Foot FOTO 49 (06/02/2022)    Time 8    Period Weeks    Status New    Target Date 07/29/22      PT LONG TERM GOAL #4   Title Pt will be able to negotiate 4 regular steps without UE assist, with a reciprocal gait pattern 3 times to decrease difficulty negotiating stairs to get to her second floor appartment.    Baseline One UE assist, ascending stairs, reciprocal pattern, but step to pattern with one UE assist descending stairs (06/02/2022)    Time 8    Period Weeks    Status New    Target Date 07/29/22              Plan - 06/17/22 1639     Clinical Impression Statement Worked on improving gastroc strength and flexibility to promote heel strike and push off phase of gait L LE. Improving gait pattern after cues and practice. Able to ambulate with R and L horizontal head turns without looking at her feet and no LOB. Pt tolerated  session well without aggravation of symptoms. Pt will benefit from continued skilled physical therapy services to improve strength, balance, function, and ability to ambulate and negotiate stairs with less difficulty.    Personal Factors and Comorbidities Comorbidity 3+;Past/Current  Experience;Fitness;Time since onset of injury/illness/exacerbation    Comorbidities Anxiety, depression, hx of DVT, HTN, hx of CVA affecting L UE and LE    Examination-Activity Limitations Stairs;Carry;Locomotion Level    Stability/Clinical Decision Making Stable/Uncomplicated    Clinical Decision Making Low    Rehab Potential Fair    PT Frequency 2x / week    PT Duration 8 weeks    PT Treatment/Interventions Electrical Stimulation;Iontophoresis 68m/ml Dexamethasone;Gait training;Stair training;Functional mobility training;Therapeutic activities;Therapeutic exercise;Balance training;Neuromuscular re-education;Patient/family education;Manual techniques;Dry needling    PT Next Visit Plan hip strengthening, ankle DF ROM. gastric strengthening (isometrics, concentrics, eccentrics), manual techniques, modalities PRN    Consulted and Agree with Plan of Care Patient                MJoneen BoersPT, DPT  06/17/2022, 4:41 PM

## 2022-06-21 ENCOUNTER — Ambulatory Visit: Payer: No Typology Code available for payment source

## 2022-06-21 DIAGNOSIS — M79672 Pain in left foot: Secondary | ICD-10-CM | POA: Diagnosis not present

## 2022-06-21 DIAGNOSIS — R262 Difficulty in walking, not elsewhere classified: Secondary | ICD-10-CM

## 2022-06-21 DIAGNOSIS — M6281 Muscle weakness (generalized): Secondary | ICD-10-CM

## 2022-06-21 NOTE — Therapy (Signed)
OUTPATIENT PHYSICAL THERAPY TREATMENT NOTE   Patient Name: Carol Johnson MRN: [id number] DOB:May 02, 1966, 56 y.o., female Today's Date: 06/21/2022  PCP: Abner Greenspan, MD  REFERRING PROVIDER: Owens Loffler, MD   PT End of Session - 06/21/22 1636     Visit Number 6    Number of Visits 17    Date for PT Re-Evaluation 07/29/22    PT Start Time 1636    PT Stop Time 4967    PT Time Calculation (min) 39 min    Activity Tolerance Patient tolerated treatment well    Behavior During Therapy [facility] for tasks assessed/performed                Past Medical History:  Diagnosis Date   Abdominal pain, unspecified site    Allergic rhinitis    Anxiety    Asthma    Depression    DVT (deep venous thrombosis) ([place]) 2016   DVT of deep femoral vein, left ([place])    Fibroid uterus    GERD (gastroesophageal reflux disease)    Hypercholesterolemia    Hypertension    Internal hemorrhoid    Irritable bowel syndrome    Lymphocytic colitis 01/02/2015   Pneumonia 1998   Stroke ([place]) 2020   left sided weekness   Ulcerative colitis ([place])    URI (upper respiratory infection)    Uterine fibroid    Vitamin B12 deficiency    Past Surgical History:  Procedure Laterality Date   CESAREAN SECTION  08/30/2002   CHOLECYSTECTOMY     laparoscopic "gallstones"   ESOPHAGOGASTRODUODENOSCOPY (EGD) WITH PROPOFOL N/A 05/03/2014   Procedure: ESOPHAGOGASTRODUODENOSCOPY (EGD) WITH PROPOFOL;  Surgeon: Inda Castle, MD;  Location: [facility];  Service: Endoscopy;  Laterality: N/A;   UTERINE FIBROID SURGERY     Patient Active Problem List   Diagnosis Date Noted   Acute bronchitis 01/26/2022   Cellulitis 11/02/2021   Allergy to drug 11/02/2021   Bacterial skin infection 10/29/2021   COVID-19 09/29/2021   Cerebrovascular accident ([place]) 01/12/2021   Chronic neck pain 01/12/2021   Gait abnormality 01/12/2021   Snores 01/12/2021   Vertigo 10/29/2020   Hearing loss 10/29/2020   Hypercoagulable  state ([place]) 12/18/2019   Hair loss 12/03/2019   Aneurysm of anterior cerebral artery 12/03/2019   Abnormality of gait 09/04/2019   Hemiparesis affecting nondominant side as late effect of stroke ([place]) 07/24/2019   Muscle weakness (generalized) 06/11/2019   Hemiplegia and hemiparesis following cerebral infarction affecting left non-dominant side ([place]) 06/11/2019   Major depressive disorder    Hypokalemia    Acute blood loss anemia    Ulcerative colitis with complication ([place])    H/O: CVA (cerebrovascular accident) 05/23/2019   Thrombocytosis 05/23/2019   Coronary artery disease involving native coronary artery of native heart without angina pectoris 03/21/2018   Coronary artery calcification 02/10/2018   Obesity (BMI 30-39.9) 02/10/2018   Former smoker 01/15/2018   Dyspnea on exertion 12/21/2017   Carpal tunnel syndrome 12/21/2017   Stress incontinence 12/21/2017   Paresthesia of both feet 07/04/2017   Always thirsty 07/04/2017   Abdominal bloating 12/01/2015   Acute sinusitis 12/01/2015   Fatigue 12/01/2015   Vitamin B12 deficiency 12/01/2015   Lymphocytic colitis 01/16/2015   Anemia, iron deficiency 01/16/2015   Anxiety state 12/20/2014   Diarrhea 11/27/2014   Insomnia 11/27/2014   History of DVT (deep vein thrombosis) 09/17/2014   OSA (obstructive sleep apnea) 09/02/2014   Gastritis, chronic 06/06/2014   Unspecified gastritis and gastroduodenitis without  mention of hemorrhage 05/03/2014   Abdominal pain, epigastric 04/19/2014   Internal hemorrhoids with other complication 33/29/5188   Routine general medical examination at a health care facility 10/14/2011   Thyroid nodule 05/16/2011   Rectal bleeding 11/24/2010   Hemorrhoids, internal 11/24/2010   Irritable bowel syndrome (IBS) 11/24/2010   ANEMIA 10/02/2009   Hyperlipidemia 07/18/2009   Essential hypertension 07/18/2009   DYSPNEA ON EXERTION 07/18/2009   THYROID CYST 01/31/2008   ANXIETY 01/31/2008   Depression  with anxiety 01/31/2008   GASTRIC ULCER, HX OF 01/31/2008   FIBROIDS, UTERUS 11/13/2007   Allergic rhinitis 11/13/2007   Asthma 11/13/2007   GERD 11/13/2007   IRRITABLE BOWEL SYNDROME 11/13/2007   INTERNAL HEMORRHOIDS 11/12/2003    REFERRING DIAG: C16.606 (ICD-10-CM) - Acute foot pain, left  R26.81 (ICD-10-CM) - Gait instability   THERAPY DIAG:  Pain in left foot  Muscle weakness (generalized)  Difficulty in walking, not elsewhere classified  Rationale for Evaluation and Treatment Rehabilitation  PERTINENT HISTORY: L foot pain, gait instability. L heel pain. Feels like she is slapping her L foot as wall as walking with the inside of her foot (increased L foot pronation) when ambulating. Pain began last year during her son's football game. Had to wear a boot for 6 weeks afterwards. Has been not right since then but was also not walking correctly. Currently not walking the right way to protect her heel. L heel has a big huge lump in the back. Has not had really much pain. Wants to walk properly. Pt also bends over and looks down on the gound and that bothers her neck. Had PT for her CVA. 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 Has a fear of falling.   PRECAUTIONS: No known precautions   SUBJECTIVE: Doing ok. No pain currently.       Are you having pain? See subjective     TODAY'S TREATMENT:    Manual therapy  Seated STM R lateral hamstrings to decrease tension and improve comfort and safety with stair negotiation   Seated STM L plantar foot to improve mobility and manual toe (digits 4-5) extension stretch      Therapeutic exercises  Ascending and descending 4 regular steps with B UE assist.   R lateral knee pain with descending stairs  Seated hip adduction isometrics folded pillow squeeze 10x5 seconds for 2 sets  Ascending and descending 4 regular steps with B UE assist.  3x  Decreased R lateral knee pain with descending stairs   Then L rail going down 3x   Eccentric heel lowering B at first stair step with B UE assist, pain free range  10x2  Standing gastroc stretch at first stair step with B UE assist 30 seconds x 3   Standing L LE D2 flexion and extension with B UE assist   L 10x3    Gait with heel strike and push off L foot 300 ft,  Cues for increasing push off      Improved exercise technique, movement at target joints, use of target muscles after mod verbal, visual, tactile cues.         Response to treatment Pt tolerated session well without aggravation of symptoms.    Clinical impression  Worked on decreasing R anterior lateral knee pain to improve ability to negotiate stairs at home more safely and with less difficulty. Continued working on improving plantar foot  fascial mobility to promote toe extension for push off phase of gait. Worked on gastroc stretch and eccentric gastroc muscle contraction to promote proper stress to Achilles tendon. Pt tolerated session well without aggravation of symptoms. Pt will benefit from continued skilled physical therapy services to improve strength, balance, function, and ability to ambulate and negotiate stairs with less difficulty.        PATIENT EDUCATION: Education details: there-ex, HEP Person educated: Patient Education method: Explanation, Demonstration, Tactile cues, Verbal cues, and Handouts Education comprehension: verbalized understanding and returned demonstration   HOME EXERCISE PROGRAM: Access Code: [facility] URL: [email or web address] Date: 06/07/2022 Prepared by: Joneen Boers  Exercises - Seated Great Toe Extension  - 1 x daily - 7 x weekly - 3 sets - 10 reps - 5 seconds hold - Seated Lesser Toes Extension  - 1 x daily - 7 x weekly - 3 sets - 10 reps - 5 seconds hold  - Ankle Dorsiflexion with Resistance  - 1 x daily - 7 x weekly - 3 sets - 10 reps -  Gastroc Stretch on Wall  - 2 x daily - 7 x weekly - 1 sets - 3 reps - 30 seconds hold    PT Short Term Goals - 06/02/22 1211       PT SHORT TERM GOAL #1   Title Pt will be independent with her initial HEP to decrease pain, improve strength, balance, and ability to ambulate and negotiate stairs with less difficulty.    Baseline Pt has not yet started her HEP (06/02/2022)    Time 3    Period Weeks    Status New    Target Date 06/24/22              PT Long Term Goals - 06/02/22 1212       PT LONG TERM GOAL #1   Title Pt will improve L ankle DF AROM to at least 15 degrees to improve heel strike to push off phase of gait.    Baseline L ankle DF AROM 7 degrees (06/02/2022)    Time 8    Period Weeks    Status New    Target Date 07/29/22      PT LONG TERM GOAL #2   Title Pt will improve her hip extension and abduction strength by at least 1/2 MMT to promote ability to ambulate and negotiate stairs with less difficulty.    Baseline Hip extension 3-/5 R and L, hip abduction 3+/5 R, 3-/5 L (06/02/2022)    Time 8    Period Weeks    Status New    Target Date 07/29/22      PT LONG TERM GOAL #3   Title Pt will improve her Foot FOTO score by at least 10 points as a demonstration of improved function.    Baseline Foot FOTO 49 (06/02/2022)    Time 8    Period Weeks    Status New    Target Date 07/29/22      PT LONG TERM GOAL #4   Title Pt will be able to negotiate 4 regular steps without UE assist, with a reciprocal gait pattern 3 times to decrease difficulty negotiating stairs to get to her second floor appartment.    Baseline One UE assist, ascending stairs, reciprocal pattern, but step to pattern with one UE assist descending stairs (06/02/2022)    Time 8    Period Weeks    Status New    Target Date 07/29/22  Plan - 06/21/22 1636     Clinical Impression Statement Worked on decreasing R anterior lateral knee pain to improve ability to negotiate stairs at home  more safely and with less difficulty. Continued working on improving plantar foot fascial mobility to promote toe extension for push off phase of gait. Worked on gastroc stretch and eccentric gastroc muscle contraction to promote proper stress to Achilles tendon. Pt tolerated session well without aggravation of symptoms. Pt will benefit from continued skilled physical therapy services to improve strength, balance, function, and ability to ambulate and negotiate stairs with less difficulty.    Personal Factors and Comorbidities Comorbidity 3+;Past/Current Experience;Fitness;Time since onset of injury/illness/exacerbation    Comorbidities Anxiety, depression, hx of DVT, HTN, hx of CVA affecting L UE and LE    Examination-Activity Limitations Stairs;Carry;Locomotion Level    Stability/Clinical Decision Making Stable/Uncomplicated    Rehab Potential Fair    PT Frequency 2x / week    PT Duration 8 weeks    PT Treatment/Interventions Electrical Stimulation;Iontophoresis 26m/ml Dexamethasone;Gait training;Stair training;Functional mobility training;Therapeutic activities;Therapeutic exercise;Balance training;Neuromuscular re-education;Patient/family education;Manual techniques;Dry needling    PT Next Visit Plan hip strengthening, ankle DF ROM. gastric strengthening (isometrics, concentrics, eccentrics), manual techniques, modalities PRN    Consulted and Agree with Plan of Care Patient                MJoneen BoersPT, DPT  06/21/2022, 5:20 PM

## 2022-06-24 ENCOUNTER — Ambulatory Visit: Payer: No Typology Code available for payment source

## 2022-06-24 DIAGNOSIS — M79672 Pain in left foot: Secondary | ICD-10-CM | POA: Diagnosis not present

## 2022-06-24 DIAGNOSIS — M6281 Muscle weakness (generalized): Secondary | ICD-10-CM

## 2022-06-24 DIAGNOSIS — R262 Difficulty in walking, not elsewhere classified: Secondary | ICD-10-CM

## 2022-06-24 NOTE — Therapy (Signed)
OUTPATIENT PHYSICAL THERAPY TREATMENT NOTE   Patient Name: Carol Johnson MRN: [id number] DOB:Dec 01, 1965, 56 y.o., female Today's Date: 06/24/2022  PCP: Abner Greenspan, MD  REFERRING PROVIDER: Owens Loffler, MD   PT End of Session - 06/24/22 0857     Visit Number 7    Number of Visits 17    Date for PT Re-Evaluation 07/29/22    PT Start Time 6294    PT Stop Time 0948    PT Time Calculation (min) 51 min    Activity Tolerance Patient tolerated treatment well    Behavior During Therapy [facility] for tasks assessed/performed                 Past Medical History:  Diagnosis Date   Abdominal pain, unspecified site    Allergic rhinitis    Anxiety    Asthma    Depression    DVT (deep venous thrombosis) ([place]) 2016   DVT of deep femoral vein, left ([place])    Fibroid uterus    GERD (gastroesophageal reflux disease)    Hypercholesterolemia    Hypertension    Internal hemorrhoid    Irritable bowel syndrome    Lymphocytic colitis 01/02/2015   Pneumonia 1998   Stroke ([place]) 2020   left sided weekness   Ulcerative colitis ([place])    URI (upper respiratory infection)    Uterine fibroid    Vitamin B12 deficiency    Past Surgical History:  Procedure Laterality Date   CESAREAN SECTION  08/30/2002   CHOLECYSTECTOMY     laparoscopic "gallstones"   ESOPHAGOGASTRODUODENOSCOPY (EGD) WITH PROPOFOL N/A 05/03/2014   Procedure: ESOPHAGOGASTRODUODENOSCOPY (EGD) WITH PROPOFOL;  Surgeon: Inda Castle, MD;  Location: [facility];  Service: Endoscopy;  Laterality: N/A;   UTERINE FIBROID SURGERY     Patient Active Problem List   Diagnosis Date Noted   Acute bronchitis 01/26/2022   Cellulitis 11/02/2021   Allergy to drug 11/02/2021   Bacterial skin infection 10/29/2021   COVID-19 09/29/2021   Cerebrovascular accident ([place]) 01/12/2021   Chronic neck pain 01/12/2021   Gait abnormality 01/12/2021   Snores 01/12/2021   Vertigo 10/29/2020   Hearing loss 10/29/2020   Hypercoagulable  state ([place]) 12/18/2019   Hair loss 12/03/2019   Aneurysm of anterior cerebral artery 12/03/2019   Abnormality of gait 09/04/2019   Hemiparesis affecting nondominant side as late effect of stroke ([place]) 07/24/2019   Muscle weakness (generalized) 06/11/2019   Hemiplegia and hemiparesis following cerebral infarction affecting left non-dominant side ([place]) 06/11/2019   Major depressive disorder    Hypokalemia    Acute blood loss anemia    Ulcerative colitis with complication ([place])    H/O: CVA (cerebrovascular accident) 05/23/2019   Thrombocytosis 05/23/2019   Coronary artery disease involving native coronary artery of native heart without angina pectoris 03/21/2018   Coronary artery calcification 02/10/2018   Obesity (BMI 30-39.9) 02/10/2018   Former smoker 01/15/2018   Dyspnea on exertion 12/21/2017   Carpal tunnel syndrome 12/21/2017   Stress incontinence 12/21/2017   Paresthesia of both feet 07/04/2017   Always thirsty 07/04/2017   Abdominal bloating 12/01/2015   Acute sinusitis 12/01/2015   Fatigue 12/01/2015   Vitamin B12 deficiency 12/01/2015   Lymphocytic colitis 01/16/2015   Anemia, iron deficiency 01/16/2015   Anxiety state 12/20/2014   Diarrhea 11/27/2014   Insomnia 11/27/2014   History of DVT (deep vein thrombosis) 09/17/2014   OSA (obstructive sleep apnea) 09/02/2014   Gastritis, chronic 06/06/2014   Unspecified gastritis and gastroduodenitis  without mention of hemorrhage 05/03/2014   Abdominal pain, epigastric 04/19/2014   Internal hemorrhoids with other complication 29/51/8841   Routine general medical examination at a health care facility 10/14/2011   Thyroid nodule 05/16/2011   Rectal bleeding 11/24/2010   Hemorrhoids, internal 11/24/2010   Irritable bowel syndrome (IBS) 11/24/2010   ANEMIA 10/02/2009   Hyperlipidemia 07/18/2009   Essential hypertension 07/18/2009   DYSPNEA ON EXERTION 07/18/2009   THYROID CYST 01/31/2008   ANXIETY 01/31/2008   Depression  with anxiety 01/31/2008   GASTRIC ULCER, HX OF 01/31/2008   FIBROIDS, UTERUS 11/13/2007   Allergic rhinitis 11/13/2007   Asthma 11/13/2007   GERD 11/13/2007   IRRITABLE BOWEL SYNDROME 11/13/2007   INTERNAL HEMORRHOIDS 11/12/2003    REFERRING DIAG: Y60.630 (ICD-10-CM) - Acute foot pain, left  R26.81 (ICD-10-CM) - Gait instability   THERAPY DIAG:  Pain in left foot  Muscle weakness (generalized)  Difficulty in walking, not elsewhere classified  Rationale for Evaluation and Treatment Rehabilitation  PERTINENT HISTORY: L foot pain, gait instability. L heel pain. Feels like she is slapping her L foot as wall as walking with the inside of her foot (increased L foot pronation) when ambulating. Pain began last year during her son's football game. Had to wear a boot for 6 weeks afterwards. Has been not right since then but was also not walking correctly. Currently not walking the right way to protect her heel. L heel has a big huge lump in the back. Has not had really much pain. Wants to walk properly. Pt also bends over and looks down on the gound and that bothers her neck. Had PT for her CVA. 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 Has a fear of falling.   PRECAUTIONS: No known precautions   SUBJECTIVE: Foot is fine, no pain.       Are you having pain? See subjective     TODAY'S TREATMENT:    Manual therapy  Seated STM L plantar foot to improve mobility and manual toe (digits 4-5) extension stretch  Seated STM Achilles (distally) to improve fascial mobility     Therapeutic exercises  Eccentric heel lowering B at first stair step with B UE assist, pain free range  10x3  Standing gastroc stretch at first stair step with B UE assist 30 seconds x 3  Provided temporary arch support L foot to decrease pronation. No change.   Gait with horizontal head turns 32 ft 4x  Stepping over 5 mini  hurdles 7x  Unsteadiness when on R LE when fatigued.   SLS with contralateral UE 2 finger light touch assist  R 10x5 seconds  L 10x5 seconds   More difficulty when standing on L LE  Gait with emphasis on L foot push-off phase   Improved exercise technique, movement at target joints, use of target muscles after mod verbal, visual, tactile cues.         Response to treatment Pt tolerated session well without aggravation of symptoms.    Clinical impression  No change in L foot pronation during stance phase with temporary arch support, therefore was removed. Worked on single leg balance for obstacle negotiation to decrease fall risk. Difficulty when standing on L LE > R LE. Continued working on eccentric gastroc muscle strengthening as well as stretches to promote proper stress for healing. Improved L LE push-off phase of gait after verbal and visual cues.  Pt tolerated session well without aggravation of symptoms. Pt will benefit from continued skilled physical therapy services to improve strength, balance, function, and ability to ambulate and negotiate stairs with less difficulty.        PATIENT EDUCATION: Education details: there-ex, HEP Person educated: Patient Education method: Explanation, Demonstration, Tactile cues, Verbal cues, and Handouts Education comprehension: verbalized understanding and returned demonstration   HOME EXERCISE PROGRAM: Access Code: [facility] URL: [email or web address] Date: 06/07/2022 Prepared by: Joneen Boers  Exercises - Seated Great Toe Extension  - 1 x daily - 7 x weekly - 3 sets - 10 reps - 5 seconds hold - Seated Lesser Toes Extension  - 1 x daily - 7 x weekly - 3 sets - 10 reps - 5 seconds hold  - Ankle Dorsiflexion with Resistance  - 1 x daily - 7 x weekly - 3 sets - 10 reps - Gastroc Stretch on Wall  - 2 x daily - 7 x weekly - 1 sets - 3 reps - 30 seconds hold  - Standing Single Leg Stance with Unilateral Counter  Support  - 1 x daily - 7 x weekly - 3 sets - 10 reps - 5 seconds hold      PT Short Term Goals - 06/02/22 1211       PT SHORT TERM GOAL #1   Title Pt will be independent with her initial HEP to decrease pain, improve strength, balance, and ability to ambulate and negotiate stairs with less difficulty.    Baseline Pt has not yet started her HEP (06/02/2022)    Time 3    Period Weeks    Status New    Target Date 06/24/22              PT Long Term Goals - 06/02/22 1212       PT LONG TERM GOAL #1   Title Pt will improve L ankle DF AROM to at least 15 degrees to improve heel strike to push off phase of gait.    Baseline L ankle DF AROM 7 degrees (06/02/2022)    Time 8    Period Weeks    Status New    Target Date 07/29/22      PT LONG TERM GOAL #2   Title Pt will improve her hip extension and abduction strength by at least 1/2 MMT to promote ability to ambulate and negotiate stairs with less difficulty.    Baseline Hip extension 3-/5 R and L, hip abduction 3+/5 R, 3-/5 L (06/02/2022)    Time 8    Period Weeks    Status New    Target Date 07/29/22      PT LONG TERM GOAL #3   Title Pt will improve her Foot FOTO score by at least 10 points as a demonstration of improved function.    Baseline Foot FOTO 49 (06/02/2022)    Time 8    Period Weeks    Status New    Target Date 07/29/22      PT LONG TERM GOAL #4   Title Pt will be able to negotiate 4 regular steps without UE assist, with a reciprocal gait pattern 3 times to decrease difficulty negotiating stairs to get to her second floor appartment.    Baseline One UE assist, ascending stairs, reciprocal pattern, but step to pattern with one UE assist descending stairs (06/02/2022)    Time 8    Period Weeks    Status New    Target Date 07/29/22  Plan - 06/24/22 0857     Clinical Impression Statement No change in L foot pronation during stance phase with temporary arch support, therefore was removed. Worked on  single leg balance for obstacle negotiation to decrease fall risk. Difficulty when standing on L LE > R LE. Continued working on eccentric gastroc muscle strengthening as well as stretches to promote proper stress for healing. Improved L LE push-off phase of gait after verbal and visual cues. Pt tolerated session well without aggravation of symptoms. Pt will benefit from continued skilled physical therapy services to improve strength, balance, function, and ability to ambulate and negotiate stairs with less difficulty.    Personal Factors and Comorbidities Comorbidity 3+;Past/Current Experience;Fitness;Time since onset of injury/illness/exacerbation    Comorbidities Anxiety, depression, hx of DVT, HTN, hx of CVA affecting L UE and LE    Examination-Activity Limitations Stairs;Carry;Locomotion Level    Stability/Clinical Decision Making Stable/Uncomplicated    Rehab Potential Fair    PT Frequency 2x / week    PT Duration 8 weeks    PT Treatment/Interventions Electrical Stimulation;Iontophoresis 80m/ml Dexamethasone;Gait training;Stair training;Functional mobility training;Therapeutic activities;Therapeutic exercise;Balance training;Neuromuscular re-education;Patient/family education;Manual techniques;Dry needling    PT Next Visit Plan hip strengthening, ankle DF ROM. gastric strengthening (isometrics, concentrics, eccentrics), manual techniques, modalities PRN    Consulted and Agree with Plan of Care Patient                 MJoneen BoersPT, DPT  06/24/2022, 1:07 PM

## 2022-06-28 ENCOUNTER — Ambulatory Visit: Payer: No Typology Code available for payment source

## 2022-07-01 ENCOUNTER — Ambulatory Visit: Payer: No Typology Code available for payment source | Attending: Family Medicine

## 2022-07-01 DIAGNOSIS — R262 Difficulty in walking, not elsewhere classified: Secondary | ICD-10-CM | POA: Diagnosis present

## 2022-07-01 DIAGNOSIS — M6281 Muscle weakness (generalized): Secondary | ICD-10-CM | POA: Diagnosis not present

## 2022-07-01 DIAGNOSIS — M79672 Pain in left foot: Secondary | ICD-10-CM | POA: Insufficient documentation

## 2022-07-01 NOTE — Therapy (Signed)
OUTPATIENT PHYSICAL THERAPY TREATMENT NOTE   Patient Name: Carol Johnson MRN: [id number] DOB:Nov 03, 1965, 56 y.o., female Today's Date: 07/01/2022  PCP: Abner Greenspan, MD  REFERRING PROVIDER: Owens Loffler, MD   PT End of Session - 07/01/22 1641     Visit Number 8    Number of Visits 17    Date for PT Re-Evaluation 07/29/22    PT Start Time 1640    PT Stop Time 2248    PT Time Calculation (min) 35 min    Activity Tolerance Patient tolerated treatment well    Behavior During Therapy [facility] for tasks assessed/performed                 Past Medical History:  Diagnosis Date   Abdominal pain, unspecified site    Allergic rhinitis    Anxiety    Asthma    Depression    DVT (deep venous thrombosis) ([place]) 2016   DVT of deep femoral vein, left ([place])    Fibroid uterus    GERD (gastroesophageal reflux disease)    Hypercholesterolemia    Hypertension    Internal hemorrhoid    Irritable bowel syndrome    Lymphocytic colitis 01/02/2015   Pneumonia 1998   Stroke ([place]) 2020   left sided weekness   Ulcerative colitis ([place])    URI (upper respiratory infection)    Uterine fibroid    Vitamin B12 deficiency    Past Surgical History:  Procedure Laterality Date   CESAREAN SECTION  08/30/2002   CHOLECYSTECTOMY     laparoscopic "gallstones"   ESOPHAGOGASTRODUODENOSCOPY (EGD) WITH PROPOFOL N/A 05/03/2014   Procedure: ESOPHAGOGASTRODUODENOSCOPY (EGD) WITH PROPOFOL;  Surgeon: Inda Castle, MD;  Location: [facility];  Service: Endoscopy;  Laterality: N/A;   UTERINE FIBROID SURGERY     Patient Active Problem List   Diagnosis Date Noted   Acute bronchitis 01/26/2022   Cellulitis 11/02/2021   Allergy to drug 11/02/2021   Bacterial skin infection 10/29/2021   COVID-19 09/29/2021   Cerebrovascular accident ([place]) 01/12/2021   Chronic neck pain 01/12/2021   Gait abnormality 01/12/2021   Snores 01/12/2021   Vertigo 10/29/2020   Hearing loss 10/29/2020   Hypercoagulable  state ([place]) 12/18/2019   Hair loss 12/03/2019   Aneurysm of anterior cerebral artery 12/03/2019   Abnormality of gait 09/04/2019   Hemiparesis affecting nondominant side as late effect of stroke ([place]) 07/24/2019   Muscle weakness (generalized) 06/11/2019   Hemiplegia and hemiparesis following cerebral infarction affecting left non-dominant side ([place]) 06/11/2019   Major depressive disorder    Hypokalemia    Acute blood loss anemia    Ulcerative colitis with complication ([place])    H/O: CVA (cerebrovascular accident) 05/23/2019   Thrombocytosis 05/23/2019   Coronary artery disease involving native coronary artery of native heart without angina pectoris 03/21/2018   Coronary artery calcification 02/10/2018   Obesity (BMI 30-39.9) 02/10/2018   Former smoker 01/15/2018   Dyspnea on exertion 12/21/2017   Carpal tunnel syndrome 12/21/2017   Stress incontinence 12/21/2017   Paresthesia of both feet 07/04/2017   Always thirsty 07/04/2017   Abdominal bloating 12/01/2015   Acute sinusitis 12/01/2015   Fatigue 12/01/2015   Vitamin B12 deficiency 12/01/2015   Lymphocytic colitis 01/16/2015   Anemia, iron deficiency 01/16/2015   Anxiety state 12/20/2014   Diarrhea 11/27/2014   Insomnia 11/27/2014   History of DVT (deep vein thrombosis) 09/17/2014   OSA (obstructive sleep apnea) 09/02/2014   Gastritis, chronic 06/06/2014   Unspecified gastritis and gastroduodenitis  without mention of hemorrhage 05/03/2014   Abdominal pain, epigastric 04/19/2014   Internal hemorrhoids with other complication 18/56/3149   Routine general medical examination at a health care facility 10/14/2011   Thyroid nodule 05/16/2011   Rectal bleeding 11/24/2010   Hemorrhoids, internal 11/24/2010   Irritable bowel syndrome (IBS) 11/24/2010   ANEMIA 10/02/2009   Hyperlipidemia 07/18/2009   Essential hypertension 07/18/2009   DYSPNEA ON EXERTION 07/18/2009   THYROID CYST 01/31/2008   ANXIETY 01/31/2008   Depression  with anxiety 01/31/2008   GASTRIC ULCER, HX OF 01/31/2008   FIBROIDS, UTERUS 11/13/2007   Allergic rhinitis 11/13/2007   Asthma 11/13/2007   GERD 11/13/2007   IRRITABLE BOWEL SYNDROME 11/13/2007   INTERNAL HEMORRHOIDS 11/12/2003    REFERRING DIAG: F02.637 (ICD-10-CM) - Acute foot pain, left  R26.81 (ICD-10-CM) - Gait instability   THERAPY DIAG:  Muscle weakness (generalized)  Pain in left foot  Difficulty in walking, not elsewhere classified  Rationale for Evaluation and Treatment Rehabilitation  PERTINENT HISTORY: L foot pain, gait instability. L heel pain. Feels like she is slapping her L foot as wall as walking with the inside of her foot (increased L foot pronation) when ambulating. Pain began last year during her son's football game. Had to wear a boot for 6 weeks afterwards. Has been not right since then but was also not walking correctly. Currently not walking the right way to protect her heel. L heel has a big huge lump in the back. Has not had really much pain. Wants to walk properly. Pt also bends over and looks down on the gound and that bothers her neck. Had PT for her CVA. 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 Has a fear of falling.   PRECAUTIONS: No known precautions   SUBJECTIVE: Denies foot pain. Having difficulty descending stairs.      Are you having pain? See subjective     TODAY'S TREATMENT:    Manual therapy  4x30 sec gastroc soleus stretch on LLE Grade 3-4 AP talocrural mobilizations to improve CKC ankle DF AROM. 5x30 sec bouts     Therapeutic exercises   Stair assessment: Pt ascends stairs step to no difficulty. Pt demonstrates with descending stairs limited L ankle CKC DF when progressing RLE first leading to compensation with truncal rotation. Per initiation of manual intervention above   SLS on airex to strengthen ankle inversion/eversion: 4x30 se c  with B finger support for assist. SBA. Also for ankle proprioception.   CKC ankle DF lunge LLE on second 6" stair and BUE support: x20   Education on possible benefits of medial arch support orthotic for foot positioning.   Seated L ankle inversion and Eversion with RTB: x20/direction.    Response to treatment Pt tolerated session well without aggravation of symptoms.    Clinical impression Continuing PT POC with focus on ankle strengthening and ankle mobility to assist in improved gait as pt denies any pain with activity. Pt understanding of education on possible arch support via orthotics if gait isn't improving as possible intervention for foot positioning to prevent other MSK aches/pains. Overall session limited as pt 10 minutes late. Pt will benefit from continued skilled physical therapy services to improve strength, balance, function, and ability to ambulate and negotiate stairs with less difficulty.    PATIENT EDUCATION: Education details: there-ex, HEP Person educated: Patient Education method: Explanation, Demonstration, Tactile cues, Verbal cues, and  Handouts Education comprehension: verbalized understanding and returned demonstration   HOME EXERCISE PROGRAM: Access Code: [facility] URL: [email or web address] Date: 06/07/2022 Prepared by: Joneen Boers  Exercises - Seated Great Toe Extension  - 1 x daily - 7 x weekly - 3 sets - 10 reps - 5 seconds hold - Seated Lesser Toes Extension  - 1 x daily - 7 x weekly - 3 sets - 10 reps - 5 seconds hold  - Ankle Dorsiflexion with Resistance  - 1 x daily - 7 x weekly - 3 sets - 10 reps - Gastroc Stretch on Wall  - 2 x daily - 7 x weekly - 1 sets - 3 reps - 30 seconds hold  - Standing Single Leg Stance with Unilateral Counter Support  - 1 x daily - 7 x weekly - 3 sets - 10 reps - 5 seconds hold      PT Short Term Goals - 06/02/22 1211       PT SHORT TERM GOAL #1   Title Pt will be independent with her initial  HEP to decrease pain, improve strength, balance, and ability to ambulate and negotiate stairs with less difficulty.    Baseline Pt has not yet started her HEP (06/02/2022)    Time 3    Period Weeks    Status New    Target Date 06/24/22              PT Long Term Goals - 06/02/22 1212       PT LONG TERM GOAL #1   Title Pt will improve L ankle DF AROM to at least 15 degrees to improve heel strike to push off phase of gait.    Baseline L ankle DF AROM 7 degrees (06/02/2022)    Time 8    Period Weeks    Status New    Target Date 07/29/22      PT LONG TERM GOAL #2   Title Pt will improve her hip extension and abduction strength by at least 1/2 MMT to promote ability to ambulate and negotiate stairs with less difficulty.    Baseline Hip extension 3-/5 R and L, hip abduction 3+/5 R, 3-/5 L (06/02/2022)    Time 8    Period Weeks    Status New    Target Date 07/29/22      PT LONG TERM GOAL #3   Title Pt will improve her Foot FOTO score by at least 10 points as a demonstration of improved function.    Baseline Foot FOTO 49 (06/02/2022)    Time 8    Period Weeks    Status New    Target Date 07/29/22      PT LONG TERM GOAL #4   Title Pt will be able to negotiate 4 regular steps without UE assist, with a reciprocal gait pattern 3 times to decrease difficulty negotiating stairs to get to her second floor appartment.    Baseline One UE assist, ascending stairs, reciprocal pattern, but step to pattern with one UE assist descending stairs (06/02/2022)    Time 8    Period Weeks    Status New    Target Date 07/29/22                    Salem Caster. Fairly IV, PT, DPT Physical Therapist- [facility]  07/01/2022, 5:24 PM

## 2022-07-02 NOTE — Telephone Encounter (Signed)
Patient started PT with [facility] 06/02/2022

## 2022-07-21 ENCOUNTER — Telehealth: Payer: Self-pay

## 2022-07-21 NOTE — Telephone Encounter (Signed)
No show. Called patient who said that she forgot. Was helping her mother for Thanksgiving preparations. Will be able to make it to her next appointment and get a printout of the rest of the schedule.

## 2022-07-26 ENCOUNTER — Ambulatory Visit: Payer: No Typology Code available for payment source

## 2022-07-26 DIAGNOSIS — M6281 Muscle weakness (generalized): Secondary | ICD-10-CM

## 2022-07-26 DIAGNOSIS — R262 Difficulty in walking, not elsewhere classified: Secondary | ICD-10-CM

## 2022-07-26 DIAGNOSIS — M79672 Pain in left foot: Secondary | ICD-10-CM

## 2022-07-26 NOTE — Therapy (Signed)
OUTPATIENT PHYSICAL THERAPY TREATMENT NOTE   Patient Name: Carol Johnson MRN: [id number] DOB:March 23, 1966, 56 y.o., female Today's Date: 07/26/2022  PCP: Abner Greenspan, MD  REFERRING PROVIDER: Owens Loffler, MD   PT End of Session - 07/26/22 1522     Visit Number 9    Number of Visits 17    Date for PT Re-Evaluation 07/29/22    PT Start Time 9371   pt arrived 22 minutes late   PT Stop Time 1601    PT Time Calculation (min) 38 min    Activity Tolerance Patient tolerated treatment well    Behavior During Therapy [facility] for tasks assessed/performed                  Past Medical History:  Diagnosis Date   Abdominal pain, unspecified site    Allergic rhinitis    Anxiety    Asthma    Depression    DVT (deep venous thrombosis) ([place]) 2016   DVT of deep femoral vein, left ([place])    Fibroid uterus    GERD (gastroesophageal reflux disease)    Hypercholesterolemia    Hypertension    Internal hemorrhoid    Irritable bowel syndrome    Lymphocytic colitis 01/02/2015   Pneumonia 1998   Stroke ([place]) 2020   left sided weekness   Ulcerative colitis ([place])    URI (upper respiratory infection)    Uterine fibroid    Vitamin B12 deficiency    Past Surgical History:  Procedure Laterality Date   CESAREAN SECTION  08/30/2002   CHOLECYSTECTOMY     laparoscopic "gallstones"   ESOPHAGOGASTRODUODENOSCOPY (EGD) WITH PROPOFOL N/A 05/03/2014   Procedure: ESOPHAGOGASTRODUODENOSCOPY (EGD) WITH PROPOFOL;  Surgeon: Inda Castle, MD;  Location: [facility];  Service: Endoscopy;  Laterality: N/A;   UTERINE FIBROID SURGERY     Patient Active Problem List   Diagnosis Date Noted   Acute bronchitis 01/26/2022   Cellulitis 11/02/2021   Allergy to drug 11/02/2021   Bacterial skin infection 10/29/2021   COVID-19 09/29/2021   Cerebrovascular accident ([place]) 01/12/2021   Chronic neck pain 01/12/2021   Gait abnormality 01/12/2021   Snores 01/12/2021   Vertigo 10/29/2020   Hearing loss  10/29/2020   Hypercoagulable state ([place]) 12/18/2019   Hair loss 12/03/2019   Aneurysm of anterior cerebral artery 12/03/2019   Abnormality of gait 09/04/2019   Hemiparesis affecting nondominant side as late effect of stroke ([place]) 07/24/2019   Muscle weakness (generalized) 06/11/2019   Hemiplegia and hemiparesis following cerebral infarction affecting left non-dominant side ([place]) 06/11/2019   Major depressive disorder    Hypokalemia    Acute blood loss anemia    Ulcerative colitis with complication ([place])    H/O: CVA (cerebrovascular accident) 05/23/2019   Thrombocytosis 05/23/2019   Coronary artery disease involving native coronary artery of native heart without angina pectoris 03/21/2018   Coronary artery calcification 02/10/2018   Obesity (BMI 30-39.9) 02/10/2018   Former smoker 01/15/2018   Dyspnea on exertion 12/21/2017   Carpal tunnel syndrome 12/21/2017   Stress incontinence 12/21/2017   Paresthesia of both feet 07/04/2017   Always thirsty 07/04/2017   Abdominal bloating 12/01/2015   Acute sinusitis 12/01/2015   Fatigue 12/01/2015   Vitamin B12 deficiency 12/01/2015   Lymphocytic colitis 01/16/2015   Anemia, iron deficiency 01/16/2015   Anxiety state 12/20/2014   Diarrhea 11/27/2014   Insomnia 11/27/2014   History of DVT (deep vein thrombosis) 09/17/2014   OSA (obstructive sleep apnea) 09/02/2014   Gastritis, chronic  06/06/2014   Unspecified gastritis and gastroduodenitis without mention of hemorrhage 05/03/2014   Abdominal pain, epigastric 04/19/2014   Internal hemorrhoids with other complication 81/15/7262   Routine general medical examination at a health care facility 10/14/2011   Thyroid nodule 05/16/2011   Rectal bleeding 11/24/2010   Hemorrhoids, internal 11/24/2010   Irritable bowel syndrome (IBS) 11/24/2010   ANEMIA 10/02/2009   Hyperlipidemia 07/18/2009   Essential hypertension 07/18/2009   DYSPNEA ON EXERTION 07/18/2009   THYROID CYST 01/31/2008    ANXIETY 01/31/2008   Depression with anxiety 01/31/2008   GASTRIC ULCER, HX OF 01/31/2008   FIBROIDS, UTERUS 11/13/2007   Allergic rhinitis 11/13/2007   Asthma 11/13/2007   GERD 11/13/2007   IRRITABLE BOWEL SYNDROME 11/13/2007   INTERNAL HEMORRHOIDS 11/12/2003    REFERRING DIAG: M35.597 (ICD-10-CM) - Acute foot pain, left  R26.81 (ICD-10-CM) - Gait instability   THERAPY DIAG:  Muscle weakness (generalized)  Pain in left foot  Difficulty in walking, not elsewhere classified  Rationale for Evaluation and Treatment Rehabilitation  PERTINENT HISTORY: L foot pain, gait instability. L heel pain. Feels like she is slapping her L foot as wall as walking with the inside of her foot (increased L foot pronation) when ambulating. Pain began last year during her son's football game. Had to wear a boot for 6 weeks afterwards. Has been not right since then but was also not walking correctly. Currently not walking the right way to protect her heel. L heel has a big huge lump in the back. Has not had really much pain. Wants to walk properly. Pt also bends over and looks down on the gound and that bothers her neck. Had PT for her CVA. 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 Has a fear of falling.   PRECAUTIONS: No known precautions   SUBJECTIVE: Foot does not hurt. Just toes curled. The blood clot (2016) in her L leg was taken cared of.       Are you having pain? See subjective     TODAY'S TREATMENT:    Manual therapy  Supine A to P to L ankle joint grade 3 to improve mobility and promote ankle DF AROM  S/L STM L heel to improve fascial mobility  Supine STM L plantar foot to improve mobility   and manual toe (digits 4-5) extension stretch    Therapeutic exercises Ascending and descending 4 regular steps with one UE assist 2x, able to perform reciprocally today  Pt was recommended over the counter  arch supports to decrease L foot pronation during gait. Pt verbalized understanding.   Supine eccentric L ankle IV 10x3 manually resisted  Supine eccentric ankle DF 10x2 manually resisted    Improved exercise technique, movement at target joints, use of target muscles after mod verbal, visual, tactile cues.         Response to treatment Pt tolerated session well without aggravation of symptoms.   10 degrees L ankle DF   Clinical impression Improved ability to ascend and descend stairs observed today. Pt able to achieve 10 degrees L ankle DF today as well. Continued working on improving L ankle mobility, decreasing soft tissue restrictions to promote AROM. Also worked on ankle DF and IV strength to improve heel strike as well as to help decrease foot pronation during gait. Pt tolerated session well without aggravation of symptoms. Pt will benefit from continued skilled physical therapy services  to improve strength, balance, function, and ability to ambulate and negotiate stairs with less difficulty.        PATIENT EDUCATION: Education details: there-ex, HEP Person educated: Patient Education method: Explanation, Demonstration, Tactile cues, Verbal cues, and Handouts Education comprehension: verbalized understanding and returned demonstration   HOME EXERCISE PROGRAM: Access Code: [facility] URL: [email or web address] Date: 06/07/2022 Prepared by: Joneen Boers  Exercises - Seated Great Toe Extension  - 1 x daily - 7 x weekly - 3 sets - 10 reps - 5 seconds hold - Seated Lesser Toes Extension  - 1 x daily - 7 x weekly - 3 sets - 10 reps - 5 seconds hold  - Ankle Dorsiflexion with Resistance  - 1 x daily - 7 x weekly - 3 sets - 10 reps - Gastroc Stretch on Wall  - 2 x daily - 7 x weekly - 1 sets - 3 reps - 30 seconds hold  - Standing Single Leg Stance with Unilateral Counter Support  - 1 x daily - 7 x weekly - 3 sets - 10 reps - 5 seconds hold      PT Short  Term Goals - 06/02/22 1211       PT SHORT TERM GOAL #1   Title Pt will be independent with her initial HEP to decrease pain, improve strength, balance, and ability to ambulate and negotiate stairs with less difficulty.    Baseline Pt has not yet started her HEP (06/02/2022)    Time 3    Period Weeks    Status New    Target Date 06/24/22              PT Long Term Goals - 06/02/22 1212       PT LONG TERM GOAL #1   Title Pt will improve L ankle DF AROM to at least 15 degrees to improve heel strike to push off phase of gait.    Baseline L ankle DF AROM 7 degrees (06/02/2022)    Time 8    Period Weeks    Status New    Target Date 07/29/22      PT LONG TERM GOAL #2   Title Pt will improve her hip extension and abduction strength by at least 1/2 MMT to promote ability to ambulate and negotiate stairs with less difficulty.    Baseline Hip extension 3-/5 R and L, hip abduction 3+/5 R, 3-/5 L (06/02/2022)    Time 8    Period Weeks    Status New    Target Date 07/29/22      PT LONG TERM GOAL #3   Title Pt will improve her Foot FOTO score by at least 10 points as a demonstration of improved function.    Baseline Foot FOTO 49 (06/02/2022)    Time 8    Period Weeks    Status New    Target Date 07/29/22      PT LONG TERM GOAL #4   Title Pt will be able to negotiate 4 regular steps without UE assist, with a reciprocal gait pattern 3 times to decrease difficulty negotiating stairs to get to her second floor appartment.    Baseline One UE assist, ascending stairs, reciprocal pattern, but step to pattern with one UE assist descending stairs (06/02/2022)    Time 8    Period Weeks    Status New    Target Date 07/29/22              Plan - 07/26/22 1522  Clinical Impression Statement Improved ability to ascend and descend stairs observed today. Pt able to achieve 10 degrees L ankle DF today as well. Continued working on improving L ankle mobility, decreasing soft tissue  restrictions to promote AROM. Also worked on ankle DF and IV strength to improve heel strike as well as to help decrease foot pronation during gait. Pt tolerated session well without aggravation of symptoms. Pt will benefit from continued skilled physical therapy services to improve strength, balance, function, and ability to ambulate and negotiate stairs with less difficulty.    Personal Factors and Comorbidities Comorbidity 3+;Past/Current Experience;Fitness;Time since onset of injury/illness/exacerbation    Comorbidities Anxiety, depression, hx of DVT, HTN, hx of CVA affecting L UE and LE    Examination-Activity Limitations Stairs;Carry;Locomotion Level    Stability/Clinical Decision Making Stable/Uncomplicated    Rehab Potential Fair    PT Frequency 2x / week    PT Duration 8 weeks    PT Treatment/Interventions Electrical Stimulation;Iontophoresis 37m/ml Dexamethasone;Gait training;Stair training;Functional mobility training;Therapeutic activities;Therapeutic exercise;Balance training;Neuromuscular re-education;Patient/family education;Manual techniques;Dry needling    PT Next Visit Plan hip strengthening, ankle DF ROM. gastric strengthening (isometrics, concentrics, eccentrics), manual techniques, modalities PRN    Consulted and Agree with Plan of Care Patient                 MJoneen BoersPT, DPT  07/26/2022, 5:39 PM

## 2022-07-29 ENCOUNTER — Telehealth: Payer: Self-pay

## 2022-07-29 ENCOUNTER — Ambulatory Visit: Payer: No Typology Code available for payment source

## 2022-07-29 NOTE — Telephone Encounter (Signed)
Patient called office at 2:24pm to state she had an appt at 130 today and was not able to make it.  Stated she was planning on attending her 12/4 appt.  Therapist notified that patient called the office 1 hour after scheduled appt time.  Jorje Guild

## 2022-07-29 NOTE — Telephone Encounter (Signed)
No show. Called patient and left a message pertaining to appointment and a reminder for the next follow up session. Return phone call requested. Phone number ([phone]) provided.   

## 2022-08-02 ENCOUNTER — Ambulatory Visit: Payer: No Typology Code available for payment source

## 2022-08-09 ENCOUNTER — Telehealth: Payer: Self-pay

## 2022-08-09 ENCOUNTER — Ambulatory Visit: Payer: No Typology Code available for payment source

## 2022-08-09 NOTE — Telephone Encounter (Signed)
Pt was a no-show for her scheduled PT visit today. Author reached out to pt via telephone. Pt reports she mis-remembered her appointment time, thought it was for later in afternoon. Author reviewed clinic's attendance policy and informed pt she is now DC from our services due to multiple consecutive no-shows. Pt encouraged to obtain a new script should she need additional PT in the future.   2:25 PM, 08/09/22 Etta Grandchild, PT, DPT Physical Therapist - [place] [phone] (Office)

## 2022-08-21 ENCOUNTER — Other Ambulatory Visit: Payer: Self-pay | Admitting: Family Medicine

## 2022-08-24 NOTE — Telephone Encounter (Signed)
See PCP's note, please schedule CPE (labs prior if possible) and then route back to me to refill

## 2022-08-24 NOTE — Telephone Encounter (Signed)
Last lipid labs were on 10/17/20 no future appts. and no recent f/u or CPE (just acute appts), please advise

## 2022-08-24 NOTE — Telephone Encounter (Signed)
Patient has been scheduled

## 2022-08-24 NOTE — Telephone Encounter (Signed)
Please schedule PE and refill until then  Thanks

## 2022-08-27 ENCOUNTER — Encounter: Payer: No Typology Code available for payment source | Admitting: Family Medicine

## 2022-09-24 ENCOUNTER — Ambulatory Visit (INDEPENDENT_AMBULATORY_CARE_PROVIDER_SITE_OTHER): Payer: No Typology Code available for payment source | Admitting: Family Medicine

## 2022-09-24 VITALS — BP 128/76 | HR 78 | Temp 97.8°F | Ht 61.5 in | Wt 223.0 lb

## 2022-09-24 DIAGNOSIS — Z0000 Encounter for general adult medical examination without abnormal findings: Secondary | ICD-10-CM

## 2022-09-24 DIAGNOSIS — E782 Mixed hyperlipidemia: Secondary | ICD-10-CM

## 2022-09-24 DIAGNOSIS — K219 Gastro-esophageal reflux disease without esophagitis: Secondary | ICD-10-CM | POA: Diagnosis not present

## 2022-09-24 DIAGNOSIS — G4733 Obstructive sleep apnea (adult) (pediatric): Secondary | ICD-10-CM

## 2022-09-24 DIAGNOSIS — D75839 Thrombocytosis, unspecified: Secondary | ICD-10-CM | POA: Diagnosis not present

## 2022-09-24 DIAGNOSIS — E538 Deficiency of other specified B group vitamins: Secondary | ICD-10-CM | POA: Diagnosis not present

## 2022-09-24 DIAGNOSIS — D509 Iron deficiency anemia, unspecified: Secondary | ICD-10-CM

## 2022-09-24 DIAGNOSIS — K51919 Ulcerative colitis, unspecified with unspecified complications: Secondary | ICD-10-CM

## 2022-09-24 DIAGNOSIS — I69359 Hemiplegia and hemiparesis following cerebral infarction affecting unspecified side: Secondary | ICD-10-CM

## 2022-09-24 DIAGNOSIS — F418 Other specified anxiety disorders: Secondary | ICD-10-CM

## 2022-09-24 DIAGNOSIS — Z86718 Personal history of other venous thrombosis and embolism: Secondary | ICD-10-CM

## 2022-09-24 DIAGNOSIS — D6859 Other primary thrombophilia: Secondary | ICD-10-CM

## 2022-09-24 DIAGNOSIS — I10 Essential (primary) hypertension: Secondary | ICD-10-CM | POA: Diagnosis not present

## 2022-09-24 LAB — LIPID PANEL
Cholesterol: 146 mg/dL (ref 0–200)
HDL: 38.2 mg/dL — ABNORMAL LOW (ref >39.00)
LDL Cholesterol: 86 mg/dL (ref 0–99)
NonHDL: 108.19
Total CHOL/HDL Ratio: 4
Triglycerides: 109 mg/dL (ref 0.0–149.0)
VLDL: 21.8 mg/dL (ref 0.0–40.0)

## 2022-09-24 LAB — COMPREHENSIVE METABOLIC PANEL
ALT: 24 U/L (ref 0–35)
AST: 26 U/L (ref 0–37)
Albumin: 4.3 g/dL (ref 3.5–5.2)
Alkaline Phosphatase: 88 U/L (ref 39–117)
BUN: 13 mg/dL (ref 6–23)
CO2: 29 mEq/L (ref 19–32)
Calcium: 9.8 mg/dL (ref 8.4–10.5)
Chloride: 104 mEq/L (ref 96–112)
Creatinine, Ser: 0.75 mg/dL (ref 0.40–1.20)
GFR: 88.04 mL/min (ref >60.00)
Glucose, Bld: 95 mg/dL (ref 70–99)
Potassium: 4.2 mEq/L (ref 3.5–5.1)
Sodium: 141 mEq/L (ref 135–145)
Total Bilirubin: 0.8 mg/dL (ref 0.2–1.2)
Total Protein: 7.1 g/dL (ref 6.0–8.3)

## 2022-09-24 LAB — CBC WITH DIFFERENTIAL/PLATELET
Basophils Absolute: 0.1 10*3/uL (ref 0.0–0.1)
Basophils Relative: 1 % (ref 0.0–3.0)
Eosinophils Absolute: 0.4 10*3/uL (ref 0.0–0.7)
Eosinophils Relative: 5.5 % — ABNORMAL HIGH (ref 0.0–5.0)
HCT: 36.8 % (ref 36.0–46.0)
Hemoglobin: 11.8 g/dL — ABNORMAL LOW (ref 12.0–15.0)
Lymphocytes Relative: 25.8 % (ref 12.0–46.0)
Lymphs Abs: 2.1 10*3/uL (ref 0.7–4.0)
MCHC: 32.1 g/dL (ref 30.0–36.0)
MCV: 74.8 fl — ABNORMAL LOW (ref 78.0–100.0)
Monocytes Absolute: 0.5 10*3/uL (ref 0.1–1.0)
Monocytes Relative: 6.3 % (ref 3.0–12.0)
Neutro Abs: 4.9 10*3/uL (ref 1.4–7.7)
Neutrophils Relative %: 61.4 % (ref 43.0–77.0)
Platelets: 398 10*3/uL (ref 150.0–400.0)
RBC: 4.92 Mil/uL (ref 3.87–5.11)
RDW: 17.1 % — ABNORMAL HIGH (ref 11.5–15.5)
WBC: 8 10*3/uL (ref 4.0–10.5)

## 2022-09-24 LAB — VITAMIN B12: Vitamin B-12: 354 pg/mL (ref 211–911)

## 2022-09-24 LAB — TSH: TSH: 1.68 u[IU]/mL (ref 0.35–5.50)

## 2022-09-24 NOTE — Assessment & Plan Note (Signed)
Fairly stable with celexa 20 mg daily and wellbutrin xl300  Is exercising  Enc good self care Trazodone prn bedtime  Reviewed stressors/ coping techniques/symptoms/ support sources/ tx options and side effects in detail today

## 2022-09-24 NOTE — Assessment & Plan Note (Signed)
Cbc today

## 2022-09-24 NOTE — Assessment & Plan Note (Signed)
Per pt, bp has been stable , no medications needed at this time  Labs due-order done  Disc imp of healthy lifestyle/ limit sodium/exercise as tolerated  Conitnues cardiology f/u with CAD

## 2022-09-24 NOTE — Assessment & Plan Note (Signed)
Continues xarelto long term

## 2022-09-24 NOTE — Assessment & Plan Note (Signed)
On ppi B12 level ordered

## 2022-09-24 NOTE — Assessment & Plan Note (Signed)
Reviewed health habits including diet and exercise and skin cancer prevention Reviewed appropriate screening tests for age  Also reviewed health mt list, fam hx and immunization status , as well as social and family history   See HPI Labs ordered  Considering shingrix Planning to schedule gyn visit for mammogram and exam  Colonoscopy utd 01/2021 with UC Mood is stable/phq of 5

## 2022-09-24 NOTE — Assessment & Plan Note (Signed)
Mild  No cpap  Plans to try oral device per records

## 2022-09-24 NOTE — Assessment & Plan Note (Signed)
Continues tx with lialda  Colonosocpy was 01/2021

## 2022-09-24 NOTE — Assessment & Plan Note (Signed)
Disc goals for lipids and reasons to control them Rev last labs with pt Rev low sat fat diet in detail  Lab today  Continues atorvastatin 80 mg daily  Diet is better with wt watchers also

## 2022-09-24 NOTE — Assessment & Plan Note (Signed)
Working on more exercise now -incl exercise bike to get stronger

## 2022-09-24 NOTE — Assessment & Plan Note (Signed)
Continues protonix 40 mg daily  B12 is in tx range  Watching diet/working on wt loss

## 2022-09-24 NOTE — Progress Notes (Signed)
Subjective:    Patient ID: Carol Johnson, female    DOB: 09-07-1965, 57 y.o.   MRN: [id number]  HPI Here for health maintenance exam and to review chronic medical problems  Feeling ok in general  Some GI issues     Wt Readings from Last 3 Encounters:  09/24/22 223 lb (101.2 kg)  05/27/22 226 lb (102.5 kg)  03/31/22 248 lb 4 oz (112.6 kg)   41.45 kg/m Vitals:   09/24/22 1147  BP: 128/76  Pulse: 78  Temp: 97.8 F (36.6 C)  SpO2: 98%   Gained wt  Then joined wt watchers Lost 28 lb, gained a little back   Eating healthy  Just got a [place] bike- very excited to try it  Had to do a lot of PT for L foot and ankle     Immunization History  Administered Date(s) Administered   Influenza Whole 07/21/2004   Influenza,inj,Quad PF,6+ Mos 06/26/2013, 09/02/2014, 05/25/2019, 07/07/2020   Influenza-Unspecified 06/20/2017, 06/20/2018   PFIZER(Purple Top)SARS-COV-2 Vaccination 11/10/2019, 12/04/2019, 06/13/2020   Pneumococcal Polysaccharide-23 10/20/2004, 07/04/2017   Td 06/21/2003   Tdap 06/26/2013   Health Maintenance Due  Topic Date Due   Hepatitis C Screening  Never done   Zoster Vaccines- Shingrix (1 of 2) Never done   MAMMOGRAM  06/19/2020   COVID-19 Vaccine (4 - 2023-24 season) 04/30/2022   PAP SMEAR-Modifier  06/19/2022   Shingrix: may be interested   Flu shot in oct   Mammogram 05/2019- [facility] Self breast exam  Has been a while- needs to schedule   Pap 05/2019- nl with neg hpv at [facility]  Colonoscopy 01/2021 with 5 y recall  Lymphocytic colitis / UC Lialda Florastar Fairly stable- not having diarrhea/ accidents etc   Stomach aches are still a problem    Mood.    09/24/2022   12:37 PM 07/30/2019    5:40 PM 04/06/2019   10:04 AM 07/04/2017    4:39 PM 06/27/2013    6:21 AM  Depression screen PHQ 2/9  Decreased Interest 0 0 1 1 0  Down, Depressed, Hopeless 1 0 1 1 0  PHQ - 2 Score 1 0 2 2 0  Altered sleeping '1  1 1   '$ Tired, decreased  energy '1  1 1   '$ Change in appetite '1  2 2   '$ Feeling bad or failure about yourself  '1  2 1   '$ Trouble concentrating 0  1 0   Moving slowly or fidgety/restless 0  0 1   Suicidal thoughts 0  0 0   PHQ-9 Score '5  9 8     '$ H/o anxiety disorder  Also depression   Celexa 20 mg daily  Wellbutrin xl 300 mg dialy  Trazodone at bedtime prn   HTN  (with h/o vascular dz and cva in past) bp is stable today  No cp or palpitations or headaches or edema  No side effects to medicines  BP Readings from Last 3 Encounters:  09/24/22 128/76  05/27/22 130/82  03/31/22 130/74      No medicines needed currently   OSA mild on sleep study in May  Did not rec cpap / was going to get a dental appliance   H/o DVT also thrombocytosis  On xarelto-long term   GERD with h/o chronic gastritis Takes protonix 40 mg daily  H/o low b12 Lab Results  Component Value Date   VITAMINB12 474 01/12/2021     Lab Results  Component Value  Date   WBC 13.5 (H) 10/28/2021   HGB 11.9 (L) 10/28/2021   HCT 38.1 10/28/2021   MCV 78.7 (L) 10/28/2021   PLT 410 (H) 10/28/2021     Thyroid Lab Results  Component Value Date   TSH 1.660 01/12/2021   H/o nodule in past   Hyperlipidemia Lab Results  Component Value Date   CHOL 132 10/17/2020   HDL 36.30 (L) 10/17/2020   LDLCALC 69 10/17/2020   LDLDIRECT 193.0 08/04/2017   TRIG 136.0 10/17/2020   CHOLHDL 4 10/17/2020   Due for labs Atorvastatin 80 mg daily   Patient Active Problem List   Diagnosis Date Noted   Allergy to drug 11/02/2021   Chronic neck pain 01/12/2021   Vertigo 10/29/2020   Hearing loss 10/29/2020   Hypercoagulable state ([place]) 12/18/2019   Hair loss 12/03/2019   Aneurysm of anterior cerebral artery 12/03/2019   Hemiparesis affecting nondominant side as late effect of stroke ([place]) 07/24/2019   Muscle weakness (generalized) 06/11/2019   Hemiplegia and hemiparesis following cerebral infarction affecting left non-dominant side ([place])  06/11/2019   Major depressive disorder    Ulcerative colitis with complication ([place])    H/O: CVA (cerebrovascular accident) 05/23/2019   Thrombocytosis 05/23/2019   Coronary artery disease involving native coronary artery of native heart without angina pectoris 03/21/2018   Coronary artery calcification 02/10/2018   Obesity (BMI 30-39.9) 02/10/2018   Former smoker 01/15/2018   Carpal tunnel syndrome 12/21/2017   Stress incontinence 12/21/2017   Paresthesia of both feet 07/04/2017   Always thirsty 07/04/2017   Vitamin B12 deficiency 12/01/2015   Lymphocytic colitis 01/16/2015   Anemia, iron deficiency 01/16/2015   Insomnia 11/27/2014   History of DVT (deep vein thrombosis) 09/17/2014   OSA (obstructive sleep apnea) 09/02/2014   Gastritis, chronic 06/06/2014   Internal hemorrhoids with other complication 30/86/5784   Routine general medical examination at a health care facility 10/14/2011   Thyroid nodule 05/16/2011   Rectal bleeding 11/24/2010   Hemorrhoids, internal 11/24/2010   Irritable bowel syndrome (IBS) 11/24/2010   Hyperlipidemia 07/18/2009   Essential hypertension 07/18/2009   ANXIETY 01/31/2008   Depression with anxiety 01/31/2008   GASTRIC ULCER, HX OF 01/31/2008   FIBROIDS, UTERUS 11/13/2007   Allergic rhinitis 11/13/2007   Asthma 11/13/2007   GERD 11/13/2007   IRRITABLE BOWEL SYNDROME 11/13/2007   INTERNAL HEMORRHOIDS 11/12/2003   Past Medical History:  Diagnosis Date   Abdominal pain, unspecified site    Allergic rhinitis    Anxiety    Asthma    Depression    DVT (deep venous thrombosis) ([place]) 2016   DVT of deep femoral vein, left (H[facility])    Fibroid uterus    GERD (gastroesophageal reflux disease)    Hypercholesterolemia    Hypertension    Internal hemorrhoid    Irritable bowel syndrome    Lymphocytic colitis 01/02/2015   Pneumonia 1998   Stroke ([place]) 2020   left sided weekness   Ulcerative colitis ([place])    URI (upper respiratory infection)     Uterine fibroid    Vitamin B12 deficiency    Past Surgical History:  Procedure Laterality Date   CESAREAN SECTION  08/30/2002   CHOLECYSTECTOMY     laparoscopic "gallstones"   ESOPHAGOGASTRODUODENOSCOPY (EGD) WITH PROPOFOL N/A 05/03/2014   Procedure: ESOPHAGOGASTRODUODENOSCOPY (EGD) WITH PROPOFOL;  Surgeon: Inda Castle, MD;  Location: [facility];  Service: Endoscopy;  Laterality: N/A;   UTERINE FIBROID SURGERY     Social History   Tobacco  Use   Smoking status: Former    Packs/day: 0.10    Years: 10.00    Total pack years: 1.00    Types: Cigarettes    Quit date: 12/07/2017    Years since quitting: 4.8   Smokeless tobacco: Never  Vaping Use   Vaping Use: Never used  Substance Use Topics   Alcohol use: Yes    Alcohol/week: 0.0 standard drinks of alcohol    Comment: rarely   Drug use: No   Family History  Problem Relation Age of Onset   Hyperlipidemia Mother    Mitral valve prolapse Mother    Heart disease Mother        arrhythmia   Pulmonary fibrosis Father    Colon cancer Paternal Aunt    Heart disease Paternal Aunt    Heart disease Paternal Uncle    Heart failure Maternal Grandmother    Uterine cancer Paternal Grandmother        with mets to the colon    Kidney disease Cousin    Esophageal cancer Neg Hx    Stomach cancer Neg Hx    Rectal cancer Neg Hx    Allergies  Allergen Reactions   Dalbavancin Rash    Lips, skin tingling rash   Zoloft [Sertraline Hcl] Diarrhea   Current Outpatient Medications on File Prior to Visit  Medication Sig Dispense Refill   acetaminophen (TYLENOL) 325 MG tablet Take 2 tablets (650 mg total) by mouth every 4 (four) hours as needed for mild pain (or temp > 37.5 C (99.5 F)).     albuterol (PROVENTIL) (2.5 MG/3ML) 0.083% nebulizer solution Take 3 mLs (2.5 mg total) by nebulization every 6 (six) hours as needed for wheezing or shortness of breath. 150 mL 1   albuterol (VENTOLIN HFA) 108 (90 Base) MCG/ACT inhaler INHALE 2 PUFFS  INTO THE LUNGS EVERY 4 (FOUR) HOURS AS NEEDED FOR WHEEZING, COUGH, OR SHORTNESS OF BREATH 6.7 each 3   atorvastatin (LIPITOR) 80 MG tablet TAKE 1 TABLET BY MOUTH DAILY AT 6 PM. 90 tablet 0   buPROPion (WELLBUTRIN XL) 300 MG 24 hr tablet TAKE 1 TABLET BY MOUTH EVERY DAY 90 tablet 1   citalopram (CELEXA) 20 MG tablet Take 1 tablet (20 mg total) by mouth at bedtime. 90 tablet 3   fexofenadine (ALLEGRA) 180 MG tablet Take 180 mg by mouth daily as needed for allergies.     fluticasone (FLONASE) 50 MCG/ACT nasal spray Place 2 sprays into both nostrils daily. 16 g 6   loperamide (IMODIUM) 2 MG capsule Take 1 capsule (2 mg total) by mouth as needed for diarrhea or loose stools. (Patient taking differently: Take 2 mg by mouth at bedtime.) 30 capsule 0   mesalamine (LIALDA) 1.2 g EC tablet TAKE 4 TABLETS BY MOUTH DAILY WITH BREAKFAST. (Patient taking differently: Take 4.8 g by mouth daily.) 360 tablet 2   pantoprazole (PROTONIX) 40 MG tablet TAKE 1 TABLET BY MOUTH EVERY DAY 90 tablet 3   rivaroxaban (XARELTO) 20 MG TABS tablet TAKE 1 TABLET BY MOUTH DAILY WITH SUPPER. 90 tablet 3   traZODone (DESYREL) 50 MG tablet TAKE 1 TO 2 TABLETS BY MOUTH AT BEDTIME AS NEEDED FOR SLEEP 180 tablet 1   vitamin B-12 (CYANOCOBALAMIN) 100 MCG tablet Take 1 tablet (100 mcg total) by mouth daily. 1 tablet    No current facility-administered medications on file prior to visit.    Review of Systems  Constitutional:  Negative for activity change, appetite change, fatigue, fever  and unexpected weight change.  HENT:  Negative for congestion, ear pain, rhinorrhea, sinus pressure and sore throat.   Eyes:  Negative for pain, redness and visual disturbance.  Respiratory:  Negative for cough, shortness of breath and wheezing.   Cardiovascular:  Negative for chest pain and palpitations.  Gastrointestinal:  Positive for abdominal pain. Negative for blood in stool, constipation and diarrhea.  Endocrine: Negative for polydipsia and  polyuria.  Genitourinary:  Negative for dysuria, frequency and urgency.  Musculoskeletal:  Negative for arthralgias, back pain and myalgias.  Skin:  Negative for pallor and rash.  Allergic/Immunologic: Negative for environmental allergies.  Neurological:  Negative for dizziness, syncope and headaches.  Hematological:  Negative for adenopathy. Does not bruise/bleed easily.  Psychiatric/Behavioral:  Negative for decreased concentration and dysphoric mood. The patient is nervous/anxious.        Objective:   Physical Exam Constitutional:      General: She is not in acute distress.    Appearance: Normal appearance. She is well-developed. She is obese. She is not ill-appearing or diaphoretic.  HENT:     Head: Normocephalic and atraumatic.     Right Ear: Tympanic membrane, ear canal and external ear normal.     Left Ear: Tympanic membrane, ear canal and external ear normal.     Nose: Nose normal. No congestion.     Mouth/Throat:     Mouth: Mucous membranes are moist.     Pharynx: Oropharynx is clear. No posterior oropharyngeal erythema.  Eyes:     General: No scleral icterus.    Extraocular Movements: Extraocular movements intact.     Conjunctiva/sclera: Conjunctivae normal.     Pupils: Pupils are equal, round, and reactive to light.  Neck:     Thyroid: No thyromegaly.     Vascular: No carotid bruit or JVD.  Cardiovascular:     Rate and Rhythm: Normal rate and regular rhythm.     Pulses: Normal pulses.     Heart sounds: Normal heart sounds.     No gallop.  Pulmonary:     Effort: Pulmonary effort is normal. No respiratory distress.     Breath sounds: Normal breath sounds. No wheezing.     Comments: Good air exch Chest:     Chest wall: No tenderness.  Abdominal:     General: Bowel sounds are normal. There is no distension or abdominal bruit.     Palpations: Abdomen is soft. There is no mass.     Tenderness: There is no abdominal tenderness.     Hernia: No hernia is present.   Genitourinary:    Comments: Breast and pelvic exam done by gyn Musculoskeletal:        General: No tenderness. Normal range of motion.     Cervical back: Normal range of motion and neck supple. No rigidity. No muscular tenderness.     Right lower leg: No edema.     Left lower leg: No edema.     Comments: No kyphosis   Lymphadenopathy:     Cervical: No cervical adenopathy.  Skin:    General: Skin is warm and dry.     Coloration: Skin is not pale.     Findings: No erythema or rash.     Comments: Solar lentigines diffusely   Neurological:     Mental Status: She is alert. Mental status is at baseline.     Cranial Nerves: No cranial nerve deficit.     Motor: No abnormal muscle tone.     Coordination:  Coordination normal.     Gait: Gait normal.     Deep Tendon Reflexes: Reflexes are normal and symmetric. Reflexes normal.     Comments: Baseline L weakness  Psychiatric:        Mood and Affect: Mood normal.        Cognition and Memory: Cognition and memory normal.           Assessment & Plan:   Problem List Items Addressed This Visit       Cardiovascular and Mediastinum   Essential hypertension    Per pt, bp has been stable , no medications needed at this time  Labs due-order done  Disc imp of healthy lifestyle/ limit sodium/exercise as tolerated  Conitnues cardiology f/u with CAD         Respiratory   OSA (obstructive sleep apnea)    Mild  No cpap  Plans to try oral device per records        Digestive   GERD    Continues protonix 40 mg daily  B12 is in tx range  Watching diet/working on wt loss      Ulcerative colitis with complication ([place])    Continues tx with lialda  Colonosocpy was 01/2021         Nervous and Auditory   Hemiparesis affecting nondominant side as late effect of stroke ([place])    Working on more exercise now -incl exercise bike to get stronger        Hematopoietic and Hemostatic   Thrombocytosis    Cbc today       Relevant  Orders   CBC with Differential/Platelet     Other   Anemia, iron deficiency    Cbc today        Depression with anxiety    Fairly stable with celexa 20 mg daily and wellbutrin [id number]  Is exercising  Enc good self care Trazodone prn bedtime  Reviewed stressors/ coping techniques/symptoms/ support sources/ tx options and side effects in detail today       History of DVT (deep vein thrombosis)    Continues xarelto long term      Hypercoagulable state ([place])    Continues xarelto long term      Hyperlipidemia    Disc goals for lipids and reasons to control them Rev last labs with pt Rev low sat fat diet in detail  Lab today  Continues atorvastatin 80 mg daily  Diet is better with wt watchers also      Relevant Orders   Lipid panel   Routine general medical examination at a health care facility - Primary    Reviewed health habits including diet and exercise and skin cancer prevention Reviewed appropriate screening tests for age  Also reviewed health mt list, fam hx and immunization status , as well as social and family history   See HPI Labs ordered  Considering shingrix Planning to schedule gyn visit for mammogram and exam  Colonoscopy utd 01/2021 with UC Mood is stable/phq of 5       Relevant Orders   TSH   Lipid panel   Comprehensive metabolic panel   CBC with Differential/Platelet   Vitamin B12 deficiency    On ppi B12 level ordered      Relevant Orders   Vitamin B12

## 2022-09-24 NOTE — Patient Instructions (Addendum)
If you are interested in the new shingles vaccine (Shingrix) - call your local pharmacy to check on coverage and availability  If affordable, get on a wait list at your pharmacy to get the vaccine.   Do call and schedule gyn follow up  If any issues or want to get mammogram elsewhere let us [facility] job with weight loss Get back to exercise when you can   Use a light moisturizer for dry skin Neutrogena has a good one   Labs today

## 2022-10-28 ENCOUNTER — Other Ambulatory Visit: Payer: Self-pay | Admitting: Family Medicine

## 2022-11-28 ENCOUNTER — Other Ambulatory Visit: Payer: Self-pay | Admitting: Family Medicine

## 2022-12-14 ENCOUNTER — Other Ambulatory Visit: Payer: Self-pay | Admitting: Family Medicine

## 2022-12-15 NOTE — Telephone Encounter (Signed)
Xarelto last filled on 11/18/21 #90 tabs/ 3 refills , ? If Celexa to soon: filled on 03/05/22 #90 with 3 refills, CPE was on 09/24/22

## 2023-01-16 ENCOUNTER — Ambulatory Visit
Admission: EM | Admit: 2023-01-16 | Discharge: 2023-01-16 | Disposition: A | Discharge status: Home / Self Care | Payer: No Typology Code available for payment source | Attending: Emergency Medicine | Admitting: Emergency Medicine

## 2023-01-16 DIAGNOSIS — R300 Dysuria: Secondary | ICD-10-CM

## 2023-01-16 LAB — POCT URINALYSIS DIP (MANUAL ENTRY)
Bilirubin, UA: NEGATIVE
Glucose, UA: NEGATIVE mg/dL
Ketones, POC UA: NEGATIVE mg/dL
Nitrite, UA: NEGATIVE
Protein Ur, POC: 100 mg/dL — AB
Spec Grav, UA: >=1.03 — AB (ref 1.010–1.025)
Urobilinogen, UA: 0.2 E.U./dL
pH, UA: 6.5 (ref 5.0–8.0)

## 2023-01-16 MED ORDER — CEPHALEXIN 500 MG PO CAPS: 500.0000 mg | ORAL_CAPSULE | Freq: Two times a day (BID) | ORAL | 0 refills | Status: AC

## 2023-01-16 NOTE — ED Triage Notes (Signed)
Patient presents to [facility] for possible UTI. Reports dysuria, urinary freq x 5 days. Not taking any OTC meds.

## 2023-01-16 NOTE — ED Provider Notes (Signed)
Carol Johnson    CSN: [id number] Arrival date & time: 01/16/23  1034      History   Chief Complaint Chief Complaint  Patient presents with   Dysuria   Urinary Frequency    HPI Carol Johnson is a 57 y.o. female.  Patient presents with 5-day history of dysuria and urinary frequency.  No treatments attempted.  She denies fever, abdominal pain, flank pain, vaginal discharge, pelvic pain, or other symptoms  The history is provided by the patient and medical records.    Past Medical History:  Diagnosis Date   Abdominal pain, unspecified site    Allergic rhinitis    Anxiety    Asthma    Depression    DVT (deep venous thrombosis) (HCC) 2016   DVT of deep femoral vein, left (HCC)    Fibroid uterus    GERD (gastroesophageal reflux disease)    Hypercholesterolemia    Hypertension    Internal hemorrhoid    Irritable bowel syndrome    Lymphocytic colitis 01/02/2015   Pneumonia 1998   Stroke (HCC) 2020   left sided weekness   Ulcerative colitis (HCC)    URI (upper respiratory infection)    Uterine fibroid    Vitamin B12 deficiency     Patient Active Problem List   Diagnosis Date Noted   Allergy to drug 11/02/2021   Chronic neck pain 01/12/2021   Vertigo 10/29/2020   Hearing loss 10/29/2020   Hypercoagulable state (HCC) 12/18/2019   Hair loss 12/03/2019   Aneurysm of anterior cerebral artery 12/03/2019   Hemiparesis affecting nondominant side as late effect of stroke (HCC) 07/24/2019   Muscle weakness (generalized) 06/11/2019   Hemiplegia and hemiparesis following cerebral infarction affecting left non-dominant side (HCC) 06/11/2019   Major depressive disorder    Ulcerative colitis with complication (HCC)    H/O: CVA (cerebrovascular accident) 05/23/2019   Thrombocytosis 05/23/2019   Coronary artery disease involving native coronary artery of native heart without angina pectoris 03/21/2018   Coronary artery calcification 02/10/2018   Obesity (BMI 30-39.9)  02/10/2018   Former smoker 01/15/2018   Carpal tunnel syndrome 12/21/2017   Stress incontinence 12/21/2017   Paresthesia of both feet 07/04/2017   Always thirsty 07/04/2017   Vitamin B12 deficiency 12/01/2015   Lymphocytic colitis 01/16/2015   Anemia, iron deficiency 01/16/2015   Insomnia 11/27/2014   History of DVT (deep vein thrombosis) 09/17/2014   OSA (obstructive sleep apnea) 09/02/2014   Gastritis, chronic 06/06/2014   Internal hemorrhoids with other complication 04/19/2014   Routine general medical examination at a health care facility 10/14/2011   Thyroid nodule 05/16/2011   Rectal bleeding 11/24/2010   Hemorrhoids, internal 11/24/2010   Irritable bowel syndrome (IBS) 11/24/2010   Hyperlipidemia 07/18/2009   Essential hypertension 07/18/2009   ANXIETY 01/31/2008   Depression with anxiety 01/31/2008   GASTRIC ULCER, HX OF 01/31/2008   FIBROIDS, UTERUS 11/13/2007   Allergic rhinitis 11/13/2007   Asthma 11/13/2007   GERD 11/13/2007   IRRITABLE BOWEL SYNDROME 11/13/2007   INTERNAL HEMORRHOIDS 11/12/2003    Past Surgical History:  Procedure Laterality Date   CESAREAN SECTION  08/30/2002   CHOLECYSTECTOMY     laparoscopic "gallstones"   ESOPHAGOGASTRODUODENOSCOPY (EGD) WITH PROPOFOL N/A 05/03/2014   Procedure: ESOPHAGOGASTRODUODENOSCOPY (EGD) WITH PROPOFOL;  Surgeon: Louis Meckel, MD;  Location: [facility];  Service: Endoscopy;  Laterality: N/A;   UTERINE FIBROID SURGERY      OB History   No obstetric history on file.  Home Medications    Prior to Admission medications   Medication Sig Start Date End Date Taking? Authorizing Provider  cephALEXin (KEFLEX) 500 MG capsule Take 1 capsule (500 mg total) by mouth 2 (two) times daily for 5 days. 01/16/23 01/21/23 Yes Mickie Bail, NP  acetaminophen (TYLENOL) 325 MG tablet Take 2 tablets (650 mg total) by mouth every 4 (four) hours as needed for mild pain (or temp > 37.5 C (99.5 F)). 06/05/19   Angiulli, Mcarthur Rossetti, PA-C  albuterol (PROVENTIL) (2.5 MG/3ML) 0.083% nebulizer solution Take 3 mLs (2.5 mg total) by nebulization every 6 (six) hours as needed for wheezing or shortness of breath. 08/05/21   Tower, Audrie Gallus, MD  albuterol (VENTOLIN HFA) 108 (90 Base) MCG/ACT inhaler INHALE 2 PUFFS INTO THE LUNGS EVERY 4 (FOUR) HOURS AS NEEDED FOR WHEEZING, COUGH, OR SHORTNESS OF BREATH 01/26/22   Tower, Olivet A, MD  atorvastatin (LIPITOR) 80 MG tablet TAKE 1 TABLET BY MOUTH DAILY AT 6 PM. 11/30/22   Tower, Audrie Gallus, MD  buPROPion (WELLBUTRIN XL) 300 MG 24 hr tablet TAKE 1 TABLET BY MOUTH EVERY DAY 06/08/22   Tower, Audrie Gallus, MD  citalopram (CELEXA) 20 MG tablet TAKE 1 TABLET BY MOUTH EVERYDAY AT BEDTIME 12/15/22   Tower, Marne A, MD  fexofenadine (ALLEGRA) 180 MG tablet Take 180 mg by mouth daily as needed for allergies.    [provider]  fluticasone (FLONASE) 50 MCG/ACT nasal spray Place 2 sprays into both nostrils daily. 01/11/20   Junie Spencer, FNP  loperamide (IMODIUM) 2 MG capsule Take 1 capsule (2 mg total) by mouth as needed for diarrhea or loose stools. Patient taking differently: Take 2 mg by mouth at bedtime. 06/05/19   Angiulli, Mcarthur Rossetti, PA-C  mesalamine (LIALDA) 1.2 g EC tablet TAKE 4 TABLETS BY MOUTH DAILY WITH BREAKFAST. Patient taking differently: Take 4.8 g by mouth daily. 04/01/21   Napoleon Form, MD  pantoprazole (PROTONIX) 40 MG tablet TAKE 1 TABLET BY MOUTH EVERY DAY 11/30/22   Tower, Audrie Gallus, MD  rivaroxaban (XARELTO) 20 MG TABS tablet TAKE 1 TABLET BY MOUTH DAILY WITH SUPPER 12/15/22   Tower, Audrie Gallus, MD  traZODone (DESYREL) 50 MG tablet TAKE 1 TO 2 TABLETS BY MOUTH AT BEDTIME AS NEEDED FOR SLEEP 05/04/22   Tower, Audrie Gallus, MD  vitamin B-12 (CYANOCOBALAMIN) 100 MCG tablet Take 1 tablet (100 mcg total) by mouth daily. 10/20/20   Tower, Audrie Gallus, MD    Family History Family History  Problem Relation Age of Onset   Hyperlipidemia Mother    Mitral valve prolapse Mother    Heart disease  Mother        arrhythmia   Pulmonary fibrosis Father    Colon cancer Paternal Aunt    Heart disease Paternal Aunt    Heart disease Paternal Uncle    Heart failure Maternal Grandmother    Uterine cancer Paternal Grandmother        with mets to the colon    Kidney disease Cousin    Esophageal cancer Neg Hx    Stomach cancer Neg Hx    Rectal cancer Neg Hx     Social History Social History   Tobacco Use   Smoking status: Former    Packs/day: 0.10    Years: 10.00    Additional pack years: 0.00    Total pack years: 1.00    Types: Cigarettes    Quit date: 12/07/2017    Years since  quitting: 5.1   Smokeless tobacco: Never  Vaping Use   Vaping Use: Never used  Substance Use Topics   Alcohol use: Yes    Alcohol/week: 0.0 standard drinks of alcohol    Comment: rarely   Drug use: No     Allergies   Dalbavancin and Zoloft [sertraline hcl]   Review of Systems Review of Systems  Constitutional:  Negative for chills and fever.  Gastrointestinal:  Negative for abdominal pain, nausea and vomiting.  Genitourinary:  Positive for dysuria and frequency. Negative for flank pain, hematuria, pelvic pain and vaginal discharge.  All other systems reviewed and are negative.    Physical Exam Triage Vital Signs ED Triage Vitals [01/16/23 1058]  Enc Vitals Group     BP (!) 149/78     Pulse Rate 78     Resp 16     Temp 98.1 F (36.7 C)     Temp Source Temporal     SpO2 97 %     Weight      Height      Head Circumference      Peak Flow      Pain Score 0     Pain Loc      Pain Edu?      Excl. in GC?    No data found.  Updated Vital Signs BP (!) 149/78 (BP Location: Left Arm)   Pulse 78   Temp 98.1 F (36.7 C) (Temporal)   Resp 16   LMP 11/29/2015   SpO2 97%   Visual Acuity Right Eye Distance:   Left Eye Distance:   Bilateral Distance:    Right Eye Near:   Left Eye Near:    Bilateral Near:     Physical Exam Vitals and nursing note reviewed.  Constitutional:       General: She is not in acute distress.    Appearance: She is well-developed. She is not ill-appearing.  HENT:     Mouth/Throat:     Mouth: Mucous membranes are moist.  Cardiovascular:     Rate and Rhythm: Normal rate and regular rhythm.     Heart sounds: Normal heart sounds.  Pulmonary:     Effort: Pulmonary effort is normal. No respiratory distress.     Breath sounds: Normal breath sounds.  Abdominal:     General: Bowel sounds are normal.     Palpations: Abdomen is soft.     Tenderness: There is no abdominal tenderness. There is no right CVA tenderness, left CVA tenderness, guarding or rebound.  Musculoskeletal:     Cervical back: Neck supple.  Skin:    General: Skin is warm and dry.  Neurological:     Mental Status: She is alert.  Psychiatric:        Mood and Affect: Mood normal.        Behavior: Behavior normal.      UC Treatments / Results  Labs (all labs ordered are listed, but only abnormal results are displayed) Labs Reviewed  POCT URINALYSIS DIP (MANUAL ENTRY) - Abnormal; Notable for the following components:      Result Value   Color, UA straw (*)    Clarity, UA turbid (*)    Spec Grav, UA >=1.030 (*)    Blood, UA large (*)    Protein Ur, POC =100 (*)    Leukocytes, UA Trace (*)    All other components within normal limits  URINE CULTURE    EKG   Radiology No results found.  Procedures Procedures (including critical care time)  Medications Ordered in UC Medications - No data to display  Initial Impression / Assessment and Plan / UC Course  I have reviewed the triage vital signs and the nursing notes.  Pertinent labs & imaging results that were available during my care of the patient were reviewed by me and considered in my medical decision making (see chart for details).    Dysuria.  Treating with Keflex. Urine culture pending. Discussed with patient that we will call her if the urine culture shows the need to change or discontinue the  antibiotic. Instructed her to follow-up with her PCP if her symptoms are not improving. Patient agrees to plan of care.     Final Clinical Impressions(s) / UC Diagnoses   Final diagnoses:  Dysuria     Discharge Instructions      Take the antibiotic as directed.  The urine culture is pending.  We will call you if it shows the need to change or discontinue your antibiotic.    Follow up with your primary care provider if your symptoms are not improving.        ED Prescriptions     Medication Sig Dispense Auth. Provider   cephALEXin (KEFLEX) 500 MG capsule Take 1 capsule (500 mg total) by mouth 2 (two) times daily for 5 days. 10 capsule Mickie Bail, NP      PDMP not reviewed this encounter.   Mickie Bail, NP 01/16/23 1143

## 2023-01-16 NOTE — Discharge Instructions (Addendum)
Take the antibiotic as directed.  The urine culture is pending.  We will call you if it shows the need to change or discontinue your antibiotic.    Follow up with your primary care provider if your symptoms are not improving.    

## 2023-01-25 ENCOUNTER — Encounter: Payer: Self-pay | Admitting: Family Medicine

## 2023-01-25 ENCOUNTER — Ambulatory Visit: Payer: No Typology Code available for payment source | Admitting: Family Medicine

## 2023-01-25 VITALS — BP 120/60 | HR 83 | Temp 98.4°F | Ht 61.5 in | Wt 237.0 lb

## 2023-01-25 DIAGNOSIS — H9201 Otalgia, right ear: Secondary | ICD-10-CM | POA: Insufficient documentation

## 2023-01-25 DIAGNOSIS — J4521 Mild intermittent asthma with (acute) exacerbation: Secondary | ICD-10-CM | POA: Diagnosis not present

## 2023-01-25 DIAGNOSIS — R059 Cough, unspecified: Secondary | ICD-10-CM

## 2023-01-25 MED ORDER — AZITHROMYCIN 250 MG PO TABS: ORAL_TABLET | ORAL | 0 refills | Status: AC

## 2023-01-25 MED ORDER — ALBUTEROL SULFATE HFA 108 (90 BASE) MCG/ACT IN AERS
INHALATION_SPRAY | RESPIRATORY_TRACT | 3 refills | Status: AC
Start: 2023-01-25 — End: ?

## 2023-01-25 MED ORDER — GUAIFENESIN-CODEINE 100-10 MG/5ML PO SYRP: 5.0000 mL | ORAL_SOLUTION | Freq: Every evening | ORAL | 0 refills | Status: DC | PRN

## 2023-01-25 MED ORDER — PREDNISONE 10 MG PO TABS: ORAL_TABLET | ORAL | 0 refills | Status: DC

## 2023-01-25 NOTE — Progress Notes (Signed)
Patient ID: Carol Johnson, female    DOB: 1966/04/25, 57 y.o.   MRN: [id number]  This visit was conducted in person.  BP 120/60 (BP Location: Left Arm, Patient Position: Sitting, Cuff Size: Large)   Pulse 83   Temp 98.4 F (36.9 C) (Temporal)   Ht 5' 1.5" (1.562 m)   Wt 237 lb (107.5 kg)   LMP 11/29/2015   SpO2 96%   BMI 44.06 kg/m    CC:  Chief Complaint  Patient presents with   Ear Pain    Right    Subjective:   HPI: Carol Johnson is a 57 y.o. female  patient of Dr. Royden Purl with history of allergies presenting on 01/25/2023 for Ear Pain (Right)   Date of onset:  3 weeks Initial symptoms included  intermittent nasal congestion, sore throat, fever off and on ( subjective with chills Symptoms progressed to right ear pain, chest congestion, cough productive.Marland Kitchen green mucus.   No SOB and wheeze    Sick contacts: son with viral infection COVID testing:   none     She has tried to treat with  Dayquil, tynelol, mucinex, Nyquil.     Hx of asthma Non-smoker.       Relevant past medical, surgical, family and social history reviewed and updated as indicated. Interim medical history since our last visit reviewed. Allergies and medications reviewed and updated. Outpatient Medications Prior to Visit  Medication Sig Dispense Refill   acetaminophen (TYLENOL) 325 MG tablet Take 2 tablets (650 mg total) by mouth every 4 (four) hours as needed for mild pain (or temp > 37.5 C (99.5 F)).     albuterol (PROVENTIL) (2.5 MG/3ML) 0.083% nebulizer solution Take 3 mLs (2.5 mg total) by nebulization every 6 (six) hours as needed for wheezing or shortness of breath. 150 mL 1   atorvastatin (LIPITOR) 80 MG tablet TAKE 1 TABLET BY MOUTH DAILY AT 6 PM. 90 tablet 2   buPROPion (WELLBUTRIN XL) 300 MG 24 hr tablet TAKE 1 TABLET BY MOUTH EVERY DAY 90 tablet 1   citalopram (CELEXA) 20 MG tablet TAKE 1 TABLET BY MOUTH EVERYDAY AT BEDTIME 90 tablet 3   fexofenadine (ALLEGRA) 180 MG tablet Take 180  mg by mouth daily as needed for allergies.     fluticasone (FLONASE) 50 MCG/ACT nasal spray Place 2 sprays into both nostrils daily. 16 g 6   loperamide (IMODIUM) 2 MG capsule Take 1 capsule (2 mg total) by mouth as needed for diarrhea or loose stools. (Patient taking differently: Take 2 mg by mouth at bedtime.) 30 capsule 0   pantoprazole (PROTONIX) 40 MG tablet TAKE 1 TABLET BY MOUTH EVERY DAY 90 tablet 2   rivaroxaban (XARELTO) 20 MG TABS tablet TAKE 1 TABLET BY MOUTH DAILY WITH SUPPER 90 tablet 3   traZODone (DESYREL) 50 MG tablet TAKE 1 TO 2 TABLETS BY MOUTH AT BEDTIME AS NEEDED FOR SLEEP 180 tablet 1   vitamin B-12 (CYANOCOBALAMIN) 100 MCG tablet Take 1 tablet (100 mcg total) by mouth daily. 1 tablet    albuterol (VENTOLIN HFA) 108 (90 Base) MCG/ACT inhaler INHALE 2 PUFFS INTO THE LUNGS EVERY 4 (FOUR) HOURS AS NEEDED FOR WHEEZING, COUGH, OR SHORTNESS OF BREATH 6.7 each 3   mesalamine (LIALDA) 1.2 g EC tablet TAKE 4 TABLETS BY MOUTH DAILY WITH BREAKFAST. (Patient taking differently: Take 4.8 g by mouth daily.) 360 tablet 2   No facility-administered medications prior to visit.     Per HPI unless  specifically indicated in ROS section below Review of Systems  Constitutional:  Positive for chills, fatigue and fever.  HENT:  Positive for ear pain. Negative for congestion.   Eyes:  Negative for pain.  Respiratory:  Positive for cough, shortness of breath and wheezing.   Cardiovascular:  Negative for chest pain, palpitations and leg swelling.  Gastrointestinal:  Negative for abdominal pain.  Genitourinary:  Negative for dysuria and vaginal bleeding.  Musculoskeletal:  Negative for back pain.  Neurological:  Negative for syncope, light-headedness and headaches.  Psychiatric/Behavioral:  Negative for dysphoric mood.    Objective:  BP 120/60 (BP Location: Left Arm, Patient Position: Sitting, Cuff Size: Large)   Pulse 83   Temp 98.4 F (36.9 C) (Temporal)   Ht 5' 1.5" (1.562 m)   Wt 237 lb  (107.5 kg)   LMP 11/29/2015   SpO2 96%   BMI 44.06 kg/m   Wt Readings from Last 3 Encounters:  01/25/23 237 lb (107.5 kg)  09/24/22 223 lb (101.2 kg)  05/27/22 226 lb (102.5 kg)      Physical Exam Constitutional:      General: She is not in acute distress.    Appearance: She is well-developed. She is not ill-appearing or toxic-appearing.  HENT:     Head: Normocephalic.     Right Ear: Hearing, tympanic membrane, ear canal and external ear normal. Tympanic membrane is not erythematous, retracted or bulging.     Left Ear: Hearing, tympanic membrane, ear canal and external ear normal. Tympanic membrane is not erythematous, retracted or bulging.     Nose: Mucosal edema and rhinorrhea present.     Right Sinus: No maxillary sinus tenderness or frontal sinus tenderness.     Left Sinus: No maxillary sinus tenderness or frontal sinus tenderness.     Mouth/Throat:     Pharynx: Uvula midline.  Eyes:     General: Lids are normal. Lids are everted, no foreign bodies appreciated.     Conjunctiva/sclera: Conjunctivae normal.     Pupils: Pupils are equal, round, and reactive to light.  Neck:     Thyroid: No thyroid mass or thyromegaly.     Vascular: No carotid bruit.     Trachea: Trachea normal.  Cardiovascular:     Rate and Rhythm: Normal rate and regular rhythm.     Pulses: Normal pulses.     Heart sounds: Normal heart sounds, S1 normal and S2 normal. No murmur heard.    No friction rub. No gallop.  Pulmonary:     Effort: Pulmonary effort is normal. No tachypnea or respiratory distress.     Breath sounds: Wheezing present. No decreased breath sounds, rhonchi or rales.  Musculoskeletal:     Cervical back: Normal range of motion and neck supple.  Skin:    General: Skin is warm and dry.     Findings: No rash.  Neurological:     Mental Status: She is alert.  Psychiatric:        Mood and Affect: Mood is not anxious or depressed.        Speech: Speech normal.        Behavior: Behavior  normal. Behavior is cooperative.        Judgment: Judgment normal.       Results for orders placed or performed during the hospital encounter of 01/16/23  POCT urinalysis dipstick  Result Value Ref Range   Color, UA straw (A) yellow   Clarity, UA turbid (A) clear   Glucose, UA negative  negative mg/dL   Bilirubin, UA negative negative   Ketones, POC UA negative negative mg/dL   Spec Grav, UA >=8.295 (A) 1.010 - 1.025   Blood, UA large (A) negative   pH, UA 6.5 5.0 - 8.0   Protein Ur, POC =100 (A) negative mg/dL   Urobilinogen, UA 0.2 0.2 or 1.0 E.U./dL   Nitrite, UA Negative Negative   Leukocytes, UA Trace (A) Negative    Assessment and Plan  Mild intermittent asthma with exacerbation Assessment & Plan: Acute, given ongoing greater than 7 to 10 days will cover with antibiotics.  Complete Z-Pak.  Treat wheeze with prednisone taper.  Given side effects to prednisone we will do a lower dose taper.  She can use cough suppressant at night.  Continue Mucinex.  Albuterol inhaler 2 puffs every 4-6 hours as needed for wheeze  Return and ER precautions given   Cough -     Albuterol Sulfate HFA; INHALE 2 PUFFS INTO THE LUNGS EVERY 4 (FOUR) HOURS AS NEEDED FOR WHEEZING, COUGH, OR SHORTNESS OF BREATH  Dispense: 6.7 each; Refill: 3  Right ear pain Assessment & Plan: Acute, no sign of otitis media or otitis externa.  Most likely pain is secondary to intermittent fluid accumulation.  Will treat with prednisone taper to help eustachian tube dysfunction resolved.   Other orders -     Azithromycin; Take 2 tablets on day 1, then 1 tablet daily on days 2 through 5  Dispense: 6 tablet; Refill: 0 -     predniSONE; 3 tabs by mouth daily x 3 days, then 2 tabs by mouth daily x 2 days then 1 tab by mouth daily x 2 days  Dispense: 15 tablet; Refill: 0 -     guaiFENesin-Codeine; Take 5-10 mLs by mouth at bedtime as needed for cough.  Dispense: 180 mL; Refill: 0    No follow-ups on file.   Kerby Nora, MD

## 2023-01-25 NOTE — Assessment & Plan Note (Signed)
Acute, given ongoing greater than 7 to 10 days will cover with antibiotics.  Complete Z-Pak.  Treat wheeze with prednisone taper.  Given side effects to prednisone we will do a lower dose taper.  She can use cough suppressant at night.  Continue Mucinex.  Albuterol inhaler 2 puffs every 4-6 hours as needed for wheeze  Return and ER precautions given

## 2023-01-25 NOTE — Assessment & Plan Note (Signed)
Acute, no sign of otitis media or otitis externa.  Most likely pain is secondary to intermittent fluid accumulation.  Will treat with prednisone taper to help eustachian tube dysfunction resolved.

## 2023-02-07 ENCOUNTER — Telehealth: Payer: Self-pay | Admitting: Family Medicine

## 2023-02-07 NOTE — Telephone Encounter (Signed)
Pt notified of Dr. Royden Purl comments. Pt said she did finish abx and her sxs are gone, so no f/u needed

## 2023-02-07 NOTE — Telephone Encounter (Signed)
I did not see a culture result from that visit  If symptoms are better I don't think she needs to follow up

## 2023-02-07 NOTE — Telephone Encounter (Signed)
[facility] for [facility] Is feeling better but receiving a message that she needs to follow-up with Dr. Milinda Antis Please advise the patient

## 2023-02-10 ENCOUNTER — Encounter: Payer: Self-pay | Admitting: Family Medicine

## 2023-02-10 ENCOUNTER — Ambulatory Visit: Payer: No Typology Code available for payment source | Admitting: Family Medicine

## 2023-02-10 VITALS — BP 128/76 | HR 87 | Temp 98.0°F | Ht 61.5 in | Wt 236.0 lb

## 2023-02-10 DIAGNOSIS — N3001 Acute cystitis with hematuria: Secondary | ICD-10-CM

## 2023-02-10 DIAGNOSIS — N393 Stress incontinence (female) (male): Secondary | ICD-10-CM | POA: Diagnosis not present

## 2023-02-10 DIAGNOSIS — K51919 Ulcerative colitis, unspecified with unspecified complications: Secondary | ICD-10-CM | POA: Diagnosis not present

## 2023-02-10 DIAGNOSIS — R300 Dysuria: Secondary | ICD-10-CM | POA: Diagnosis not present

## 2023-02-10 DIAGNOSIS — R8290 Unspecified abnormal findings in urine: Secondary | ICD-10-CM

## 2023-02-10 DIAGNOSIS — N390 Urinary tract infection, site not specified: Secondary | ICD-10-CM | POA: Insufficient documentation

## 2023-02-10 LAB — POC URINALSYSI DIPSTICK (AUTOMATED)
Bilirubin, UA: NEGATIVE
Blood, UA: 200 — AB
Glucose, UA: NEGATIVE
Ketones, UA: NEGATIVE
Nitrite, UA: NEGATIVE
Protein, UA: POSITIVE — AB
Spec Grav, UA: 1.02 (ref 1.010–1.025)
Urobilinogen, UA: 0.2 E.U./dL
pH, UA: 6 (ref 5.0–8.0)

## 2023-02-10 MED ORDER — NITROFURANTOIN MONOHYD MACRO 100 MG PO CAPS
100.0000 mg | ORAL_CAPSULE | Freq: Two times a day (BID) | ORAL | 0 refills | Status: DC
Start: 2023-02-10 — End: 2023-05-16

## 2023-02-10 NOTE — Progress Notes (Signed)
Subjective:    Patient ID: Carol Johnson, female    DOB: February 05, 1966, 57 y.o.   MRN: [id number]  HPI Pt presents for urinary symptoms  Acute uti  Stress incontinence  Also needs a new GI    Wt Readings from Last 3 Encounters:  02/10/23 236 lb (107 kg)  01/25/23 237 lb (107.5 kg)  09/24/22 223 lb (101.2 kg)   43.87 kg/m  Vitals:   02/10/23 1214  BP: 128/76  Pulse: 87  Temp: 98 F (36.7 C)  SpO2: 97%     Was seen at UC for dysuria /frequency for 5 d on 01/16/23 Positive ua  Lab Results  Component Value Date   COLORU Yellow 02/10/2023   CLARITYU Cloudy 02/10/2023   GLUCOSEUR Negative 02/10/2023   BILIRUBINUR Negative 02/10/2023   KETONESU Negative 02/10/2023   SPECGRAV 1.020 02/10/2023   RBCUR 200 Ery/uL (A) 02/10/2023   PHUR 6.0 02/10/2023   PROTEINUR Positive (A) 02/10/2023   UROBILINOGEN 0.2 02/10/2023   LEUKOCYTESUR Large (3+) (A) 02/10/2023  No culture in epic  Treated with keflex 500 mg bid for 5 d   Got better then  Yesterday started more symptoms  Pain to urinate  Some back pain- low back pain  Some blood in urine   Frequency and urgency   Today Results for orders placed or performed in visit on 02/10/23  POCT Urinalysis Dipstick (Automated)  Result Value Ref Range   Color, UA Yellow    Clarity, UA Cloudy    Glucose, UA Negative Negative   Bilirubin, UA Negative    Ketones, UA Negative    Spec Grav, UA 1.020 1.010 - 1.025   Blood, UA 200 Ery/uL (A)    pH, UA 6.0 5.0 - 8.0   Protein, UA Positive (A) Negative   Urobilinogen, UA 0.2 0.2 or 1.0 E.U./dL   Nitrite, UA Negative    Leukocytes, UA Large (3+) (A) Negative      Has incontinence (baseline) - leaks with cough/sneezing  This is getting worse and worse  Had a referral to urology in the past and the appt never was made   Recent uri/ dental - was on several antibiotics     CMP     Component Value Date/Time   NA 141 09/24/2022 1215   NA 139 02/09/2018 1146   K 4.2 09/24/2022  1215   CL 104 09/24/2022 1215   CO2 29 09/24/2022 1215   GLUCOSE 95 09/24/2022 1215   BUN 13 09/24/2022 1215   BUN 9 02/09/2018 1146   CREATININE 0.75 09/24/2022 1215   CREATININE 0.88 05/14/2020 0932   CALCIUM 9.8 09/24/2022 1215   PROT 7.1 09/24/2022 1215   ALBUMIN 4.3 09/24/2022 1215   AST 26 09/24/2022 1215   AST 16 05/14/2020 0932   ALT 24 09/24/2022 1215   ALT 19 05/14/2020 0932   ALKPHOS 88 09/24/2022 1215   BILITOT 0.8 09/24/2022 1215   BILITOT 0.4 05/14/2020 0932   GFR 88.04 09/24/2022 1215   GFRNONAA >60 10/28/2021 2109   GFRNONAA >60 05/14/2020 0932     Ulcerative colitis Was taking mesalamine for this for a while  Endocort briefly -did not work  No longer on any medicines  Symptoms are improved now    Patient Active Problem List   Diagnosis Date Noted   UTI (urinary tract infection) 02/10/2023   Mild intermittent asthma with exacerbation 01/25/2023   Right ear pain 01/25/2023   Allergy to drug 11/02/2021   Chronic  neck pain 01/12/2021   Vertigo 10/29/2020   Hearing loss 10/29/2020   Hypercoagulable state (HCC) 12/18/2019   Hair loss 12/03/2019   Aneurysm of anterior cerebral artery 12/03/2019   Hemiparesis affecting nondominant side as late effect of stroke (HCC) 07/24/2019   Muscle weakness (generalized) 06/11/2019   Hemiplegia and hemiparesis following cerebral infarction affecting left non-dominant side (HCC) 06/11/2019   Major depressive disorder    Ulcerative colitis with complication (HCC)    H/O: CVA (cerebrovascular accident) 05/23/2019   Thrombocytosis 05/23/2019   Coronary artery disease involving native coronary artery of native heart without angina pectoris 03/21/2018   Coronary artery calcification 02/10/2018   Obesity (BMI 30-39.9) 02/10/2018   Former smoker 01/15/2018   Carpal tunnel syndrome 12/21/2017   Stress incontinence 12/21/2017   Paresthesia of both feet 07/04/2017   Always thirsty 07/04/2017   Vitamin B12 deficiency  12/01/2015   Lymphocytic colitis 01/16/2015   Anemia, iron deficiency 01/16/2015   Insomnia 11/27/2014   History of DVT (deep vein thrombosis) 09/17/2014   OSA (obstructive sleep apnea) 09/02/2014   Gastritis, chronic 06/06/2014   Internal hemorrhoids with other complication 04/19/2014   Routine general medical examination at a health care facility 10/14/2011   Thyroid nodule 05/16/2011   Rectal bleeding 11/24/2010   Hemorrhoids, internal 11/24/2010   Irritable bowel syndrome (IBS) 11/24/2010   Hyperlipidemia 07/18/2009   Essential hypertension 07/18/2009   ANXIETY 01/31/2008   Depression with anxiety 01/31/2008   GASTRIC ULCER, HX OF 01/31/2008   FIBROIDS, UTERUS 11/13/2007   Allergic rhinitis 11/13/2007   Asthma 11/13/2007   GERD 11/13/2007   IRRITABLE BOWEL SYNDROME 11/13/2007   INTERNAL HEMORRHOIDS 11/12/2003   Past Medical History:  Diagnosis Date   Abdominal pain, unspecified site    Allergic rhinitis    Anxiety    Asthma    Depression    DVT (deep venous thrombosis) (HCC) 2016   DVT of deep femoral vein, left (HCC)    Fibroid uterus    GERD (gastroesophageal reflux disease)    Hypercholesterolemia    Hypertension    Internal hemorrhoid    Irritable bowel syndrome    Lymphocytic colitis 01/02/2015   Pneumonia 1998   Stroke (HCC) 2020   left sided weekness   Ulcerative colitis (HCC)    URI (upper respiratory infection)    Uterine fibroid    Vitamin B12 deficiency    Past Surgical History:  Procedure Laterality Date   CESAREAN SECTION  08/30/2002   CHOLECYSTECTOMY     laparoscopic "gallstones"   ESOPHAGOGASTRODUODENOSCOPY (EGD) WITH PROPOFOL N/A 05/03/2014   Procedure: ESOPHAGOGASTRODUODENOSCOPY (EGD) WITH PROPOFOL;  Surgeon: Louis Meckel, MD;  Location: [facility];  Service: Endoscopy;  Laterality: N/A;   UTERINE FIBROID SURGERY     Social History   Tobacco Use   Smoking status: Former    Packs/day: 0.10    Years: 10.00    Additional pack  years: 0.00    Total pack years: 1.00    Types: Cigarettes    Quit date: 12/07/2017    Years since quitting: 5.1   Smokeless tobacco: Never  Vaping Use   Vaping Use: Never used  Substance Use Topics   Alcohol use: Yes    Alcohol/week: 0.0 standard drinks of alcohol    Comment: rarely   Drug use: No   Family History  Problem Relation Age of Onset   Hyperlipidemia Mother    Mitral valve prolapse Mother    Heart disease Mother  arrhythmia   Pulmonary fibrosis Father    Colon cancer Paternal Aunt    Heart disease Paternal Aunt    Heart disease Paternal Uncle    Heart failure Maternal Grandmother    Uterine cancer Paternal Grandmother        with mets to the colon    Kidney disease Cousin    Esophageal cancer Neg Hx    Stomach cancer Neg Hx    Rectal cancer Neg Hx    Allergies  Allergen Reactions   Dalbavancin Rash    Lips, skin tingling rash   Zoloft [Sertraline Hcl] Diarrhea   Current Outpatient Medications on File Prior to Visit  Medication Sig Dispense Refill   acetaminophen (TYLENOL) 325 MG tablet Take 2 tablets (650 mg total) by mouth every 4 (four) hours as needed for mild pain (or temp > 37.5 C (99.5 F)).     albuterol (PROVENTIL) (2.5 MG/3ML) 0.083% nebulizer solution Take 3 mLs (2.5 mg total) by nebulization every 6 (six) hours as needed for wheezing or shortness of breath. 150 mL 1   albuterol (VENTOLIN HFA) 108 (90 Base) MCG/ACT inhaler INHALE 2 PUFFS INTO THE LUNGS EVERY 4 (FOUR) HOURS AS NEEDED FOR WHEEZING, COUGH, OR SHORTNESS OF BREATH 6.7 each 3   atorvastatin (LIPITOR) 80 MG tablet TAKE 1 TABLET BY MOUTH DAILY AT 6 PM. 90 tablet 2   buPROPion (WELLBUTRIN XL) 300 MG 24 hr tablet TAKE 1 TABLET BY MOUTH EVERY DAY 90 tablet 1   citalopram (CELEXA) 20 MG tablet TAKE 1 TABLET BY MOUTH EVERYDAY AT BEDTIME 90 tablet 3   fexofenadine (ALLEGRA) 180 MG tablet Take 180 mg by mouth daily as needed for allergies.     fluticasone (FLONASE) 50 MCG/ACT nasal spray  Place 2 sprays into both nostrils daily. 16 g 6   loperamide (IMODIUM) 2 MG capsule Take 1 capsule (2 mg total) by mouth as needed for diarrhea or loose stools. (Patient taking differently: Take 2 mg by mouth at bedtime.) 30 capsule 0   pantoprazole (PROTONIX) 40 MG tablet TAKE 1 TABLET BY MOUTH EVERY DAY 90 tablet 2   rivaroxaban (XARELTO) 20 MG TABS tablet TAKE 1 TABLET BY MOUTH DAILY WITH SUPPER 90 tablet 3   traZODone (DESYREL) 50 MG tablet TAKE 1 TO 2 TABLETS BY MOUTH AT BEDTIME AS NEEDED FOR SLEEP 180 tablet 1   No current facility-administered medications on file prior to visit.    Review of Systems  Constitutional:  Positive for fatigue. Negative for activity change, appetite change and fever.  HENT:  Negative for congestion and sore throat.   Eyes:  Negative for itching and visual disturbance.  Respiratory:  Negative for cough and shortness of breath.   Cardiovascular:  Negative for leg swelling.  Gastrointestinal:  Negative for abdominal distention, abdominal pain, constipation, diarrhea and nausea.  Endocrine: Negative for cold intolerance and polydipsia.  Genitourinary:  Positive for dysuria, frequency and urgency. Negative for difficulty urinating, flank pain and hematuria.  Musculoskeletal:  Positive for back pain. Negative for myalgias.  Skin:  Negative for rash.  Allergic/Immunologic: Negative for immunocompromised state.  Neurological:  Negative for dizziness and weakness.  Hematological:  Negative for adenopathy.       Objective:   Physical Exam Constitutional:      General: She is not in acute distress.    Appearance: She is well-developed.  HENT:     Head: Normocephalic and atraumatic.  Eyes:     Conjunctiva/sclera: Conjunctivae normal.  Pupils: Pupils are equal, round, and reactive to light.  Cardiovascular:     Rate and Rhythm: Normal rate and regular rhythm.     Heart sounds: Normal heart sounds.  Pulmonary:     Effort: Pulmonary effort is normal.      Breath sounds: Normal breath sounds.  Abdominal:     General: Abdomen is protuberant. Bowel sounds are normal. There is no distension.     Palpations: Abdomen is soft.     Tenderness: There is abdominal tenderness. There is no right CVA tenderness, left CVA tenderness, guarding or rebound.     Comments: No cva tenderness  Mild suprapubic tenderness (pressure feeling not pain)   Musculoskeletal:     Cervical back: Normal range of motion and neck supple.  Lymphadenopathy:     Cervical: No cervical adenopathy.  Skin:    General: Skin is warm and dry.     Findings: No rash.  Neurological:     Mental Status: She is alert.  Psychiatric:        Mood and Affect: Mood normal.           Assessment & Plan:   Problem List Items Addressed This Visit       Digestive   Ulcerative colitis with complication (HCC)    Pt would like a 2nd opinion for this and prefers [place]  She has taken endocort- not effective Lialda - worked but had concerns about it  Currently no medication and symptoms are mild   Referral done to [facility]  Reports are in epic Pt will call for appt      Relevant Orders   Ambulatory referral to Gastroenterology     Genitourinary   UTI (urinary tract infection) - Primary    2nd one in 2 months  No culture from the UC visit in May but keflex did take care of her symptoms   Developed urinary symptoms yesterday incl blood and dysuria  Also worsened incontinence than baseline Positive ua with blood and leuk Culture pending Treat with macrobid pending culture  Also recommend probiotic (recently several abx for different problems) Instructed to call if symptoms worsen in meantime  Er precautions noted Handout given        Relevant Medications   nitrofurantoin, macrocrystal-monohydrate, (MACROBID) 100 MG capsule   Other Relevant Orders   Urine Culture     Other   Stress incontinence    This is worsening with time (even getting up from  sitting) Suspect weak pelvic floor Does have h/o uterine fibroids  Unsure what happened with last ref to urology  Currently has uti  Will treat this and get culture   Then ref to urology if still interested Weight loss would help also       Other Visit Diagnoses     Dysuria       Relevant Orders   POCT Urinalysis Dipstick (Automated) (Completed)

## 2023-02-10 NOTE — Assessment & Plan Note (Addendum)
2nd one in 2 months  No culture from the UC visit in May but keflex did take care of her symptoms   Developed urinary symptoms yesterday incl blood and dysuria  Also worsened incontinence than baseline Positive ua with blood and leuk Culture pending Treat with macrobid pending culture  Also recommend probiotic (recently several abx for different problems) Instructed to call if symptoms worsen in meantime  Er precautions noted Handout given

## 2023-02-10 NOTE — Patient Instructions (Addendum)
Drink lots of water Take macrobid as directed (generic)  We will call /message with culture result when it returns   In the meantime if your symptoms worsen let us know    Get a generic probiotic over the counter or eat food with active cultures to help get your bacterial flora in balance   I placed a referral to GI to get you a second opinion (in [place]) I put the referral in  Call [facility] to schedule an appointment   [facility]  [phone]      Once the uti is better we can consider a referral to urology for incontinence as well

## 2023-02-10 NOTE — Assessment & Plan Note (Signed)
This is worsening with time (even getting up from sitting) Suspect weak pelvic floor Does have h/o uterine fibroids  Unsure what happened with last ref to urology  Currently has uti  Will treat this and get culture   Then ref to urology if still interested Weight loss would help also

## 2023-02-10 NOTE — Assessment & Plan Note (Signed)
Pt would like a 2nd opinion for this and prefers [place]  She has taken endocort- not effective Lialda - worked but had concerns about it  Currently no medication and symptoms are mild   Referral done to [facility]  Reports are in epic Pt will call for appt

## 2023-02-11 LAB — URINE CULTURE
MICRO NUMBER:: 15079225
Result:: NO GROWTH
SPECIMEN QUALITY:: ADEQUATE

## 2023-02-13 ENCOUNTER — Encounter: Payer: Self-pay | Admitting: Family Medicine

## 2023-02-14 DIAGNOSIS — R8290 Unspecified abnormal findings in urine: Secondary | ICD-10-CM | POA: Insufficient documentation

## 2023-02-14 NOTE — Addendum Note (Signed)
Addended by: Roxy Manns A on: 02/14/2023 09:08 PM   Modules accepted: Orders

## 2023-02-16 ENCOUNTER — Telehealth: Payer: Self-pay

## 2023-02-16 NOTE — Telephone Encounter (Signed)
Patient is a establish patient with [facility] was last seen on 07/02/2021. Per PCP  Provider Comments   Wants a 2nd opinion re: treatment options for ulcerative colitis  Mildly symptomatic currently  Was on lialda - no longer taking  Wants to be seen in [place]  Please advise if you will accept the transfer

## 2023-02-16 NOTE — Telephone Encounter (Signed)
Called patient and got patient schedule with Dr. Allegra Lai on 05/16/2023 in [place]. Mailed out patient a reminder in the mail also

## 2023-03-18 ENCOUNTER — Ambulatory Visit: Payer: Self-pay | Admitting: Urology

## 2023-04-18 ENCOUNTER — Ambulatory Visit: Payer: No Typology Code available for payment source | Admitting: Urology

## 2023-04-21 ENCOUNTER — Other Ambulatory Visit: Payer: Self-pay | Admitting: Family Medicine

## 2023-04-21 NOTE — Telephone Encounter (Signed)
Last filled on 05/04/22 #180 tabs with 1 refill, last OV was for a possible UTI on 02/10/23, no future appts.

## 2023-05-09 ENCOUNTER — Ambulatory Visit: Payer: No Typology Code available for payment source | Admitting: Urology

## 2023-05-09 ENCOUNTER — Ambulatory Visit: Payer: Self-pay | Admitting: Urology

## 2023-05-09 ENCOUNTER — Encounter: Payer: Self-pay | Admitting: Urology

## 2023-05-09 VITALS — BP 167/89 | HR 84 | Ht 62.0 in | Wt 239.0 lb

## 2023-05-09 DIAGNOSIS — N3941 Urge incontinence: Secondary | ICD-10-CM

## 2023-05-09 DIAGNOSIS — M549 Dorsalgia, unspecified: Secondary | ICD-10-CM | POA: Diagnosis not present

## 2023-05-09 DIAGNOSIS — N39498 Other specified urinary incontinence: Secondary | ICD-10-CM

## 2023-05-09 DIAGNOSIS — Z87440 Personal history of urinary (tract) infections: Secondary | ICD-10-CM

## 2023-05-09 DIAGNOSIS — N3281 Overactive bladder: Secondary | ICD-10-CM | POA: Diagnosis not present

## 2023-05-09 LAB — URINALYSIS, COMPLETE
Bilirubin, UA: NEGATIVE
Glucose, UA: NEGATIVE
Ketones, UA: NEGATIVE
Leukocytes,UA: NEGATIVE
Nitrite, UA: NEGATIVE
Protein,UA: NEGATIVE
RBC, UA: NEGATIVE
Specific Gravity, UA: 1.01 (ref 1.005–1.030)
Urobilinogen, Ur: 0.2 mg/dL (ref 0.2–1.0)
pH, UA: 5.5 (ref 5.0–7.5)

## 2023-05-09 LAB — MICROSCOPIC EXAMINATION

## 2023-05-09 NOTE — Progress Notes (Unsigned)
05/09/2023 3:42 PM   Carol Johnson Feb 28, 1966 [id number]  Referring provider: Judy Pimple, MD [address] [place],  [place] [address]  Chief Complaint  Patient presents with   Dysuria    HPI: I was consulted to assess the patient's 2 recent bladder infections.  She was having back pain and burning.  I think her symptoms have normalized but she had infections in the month.  At baseline she voids every 1-2 hours.  She triggers urgency and sometimes urge incontinence when she goes from sitting to standing position but this been present for 2 years.  She gets up 1 time at night.  She does not wear a pad  No hysterectomy  Urine culture from June was normal.  I do not see a recent renal x-ray  No history of kidney stones bladder surgery or bladder infections   PMH: Past Medical History:  Diagnosis Date   Abdominal pain, unspecified site    Allergic rhinitis    Anxiety    Asthma    Depression    DVT (deep venous thrombosis) (HCC) 2016   DVT of deep femoral vein, left (HCC)    Fibroid uterus    GERD (gastroesophageal reflux disease)    Hypercholesterolemia    Hypertension    Internal hemorrhoid    Irritable bowel syndrome    Lymphocytic colitis 01/02/2015   Pneumonia 1998   Stroke (HCC) 2020   left sided weekness   Ulcerative colitis (HCC)    URI (upper respiratory infection)    Uterine fibroid    Vitamin B12 deficiency     Surgical History: Past Surgical History:  Procedure Laterality Date   CESAREAN SECTION  08/30/2002   CHOLECYSTECTOMY     laparoscopic "gallstones"   ESOPHAGOGASTRODUODENOSCOPY (EGD) WITH PROPOFOL N/A 05/03/2014   Procedure: ESOPHAGOGASTRODUODENOSCOPY (EGD) WITH PROPOFOL;  Surgeon: Louis Meckel, MD;  Location: [facility];  Service: Endoscopy;  Laterality: N/A;   UTERINE FIBROID SURGERY      Home Medications:  Allergies as of 05/09/2023       Reactions   Dalbavancin Rash   Lips, skin tingling rash   Zoloft [sertraline Hcl]  Diarrhea        Medication List        Accurate as of May 09, 2023  3:42 PM. If you have any questions, ask your nurse or doctor.          acetaminophen 325 MG tablet Commonly known as: TYLENOL Take 2 tablets (650 mg total) by mouth every 4 (four) hours as needed for mild pain (or temp > 37.5 C (99.5 F)).   albuterol (2.5 MG/3ML) 0.083% nebulizer solution Commonly known as: PROVENTIL Take 3 mLs (2.5 mg total) by nebulization every 6 (six) hours as needed for wheezing or shortness of breath.   albuterol 108 (90 Base) MCG/ACT inhaler Commonly known as: VENTOLIN HFA INHALE 2 PUFFS INTO THE LUNGS EVERY 4 (FOUR) HOURS AS NEEDED FOR WHEEZING, COUGH, OR SHORTNESS OF BREATH   atorvastatin 80 MG tablet Commonly known as: LIPITOR TAKE 1 TABLET BY MOUTH DAILY AT 6 PM.   buPROPion 300 MG 24 hr tablet Commonly known as: WELLBUTRIN XL TAKE 1 TABLET BY MOUTH EVERY DAY   citalopram 20 MG tablet Commonly known as: CELEXA TAKE 1 TABLET BY MOUTH EVERYDAY AT BEDTIME   fexofenadine 180 MG tablet Commonly known as: ALLEGRA Take 180 mg by mouth daily as needed for allergies.   fluticasone 50 MCG/ACT nasal spray Commonly known  as: FLONASE Place 2 sprays into both nostrils daily.   loperamide 2 MG capsule Commonly known as: IMODIUM Take 1 capsule (2 mg total) by mouth as needed for diarrhea or loose stools. What changed: when to take this   nitrofurantoin (macrocrystal-monohydrate) 100 MG capsule Commonly known as: Macrobid Take 1 capsule (100 mg total) by mouth 2 (two) times daily.   pantoprazole 40 MG tablet Commonly known as: PROTONIX TAKE 1 TABLET BY MOUTH EVERY DAY   traZODone 50 MG tablet Commonly known as: DESYREL TAKE 1 TO 2 TABLETS BY MOUTH AT BEDTIME AS NEEDED FOR SLEEP   Xarelto 20 MG Tabs tablet Generic drug: rivaroxaban TAKE 1 TABLET BY MOUTH DAILY WITH SUPPER        Allergies:  Allergies  Allergen Reactions   Dalbavancin Rash    Lips, skin  tingling rash   Zoloft [Sertraline Hcl] Diarrhea    Family History: Family History  Problem Relation Age of Onset   Hyperlipidemia Mother    Mitral valve prolapse Mother    Heart disease Mother        arrhythmia   Pulmonary fibrosis Father    Colon cancer Paternal Aunt    Heart disease Paternal Aunt    Heart disease Paternal Uncle    Heart failure Maternal Grandmother    Uterine cancer Paternal Grandmother        with mets to the colon    Kidney disease Cousin    Esophageal cancer Neg Hx    Stomach cancer Neg Hx    Rectal cancer Neg Hx     Social History:  reports that she quit smoking about 5 years ago. Her smoking use included cigarettes. She started smoking about 15 years ago. She has a 1 pack-year smoking history. She has never used smokeless tobacco. She reports current alcohol use. She reports that she does not use drugs.  ROS:                                        Physical Exam: BP (!) 167/89   Pulse 84   Ht 5\' 2"  (1.575 m)   Wt 108.4 kg   LMP 11/29/2015   BMI 43.71 kg/m   Constitutional:  Alert and oriented, No acute distress.  Laboratory Data: Lab Results  Component Value Date   WBC 8.0 09/24/2022   HGB 11.8 (L) 09/24/2022   HCT 36.8 09/24/2022   MCV 74.8 (L) 09/24/2022   PLT 398.0 09/24/2022    Lab Results  Component Value Date   CREATININE 0.75 09/24/2022    No results found for: "PSA"  No results found for: "TESTOSTERONE"  Lab Results  Component Value Date   HGBA1C 6.3 (H) 01/12/2021    Urinalysis    Component Value Date/Time   COLORURINE YELLOW 03/25/2007 1053   APPEARANCEUR CLEAR 03/25/2007 1053   LABSPEC 1.026 03/25/2007 1053   PHURINE 6.0 03/25/2007 1053   GLUCOSEU NEGATIVE 03/25/2007 1053   HGBUR NEGATIVE 03/25/2007 1053   BILIRUBINUR Negative 02/10/2023 1227   KETONESUR negative 01/16/2023 1104   KETONESUR NEGATIVE 03/25/2007 1053   PROTEINUR Positive (A) 02/10/2023 1227   PROTEINUR NEGATIVE  03/25/2007 1053   UROBILINOGEN 0.2 02/10/2023 1227   UROBILINOGEN 0.2 03/25/2007 1053   NITRITE Negative 02/10/2023 1227   NITRITE NEGATIVE 03/25/2007 1053   LEUKOCYTESUR Large (3+) (A) 02/10/2023 1227    Pertinent Imaging: Urine reviewed and sent  for culture.  Chart reviewed  Assessment & Plan: Get renal ultrasound for vague back pain likely not related in the for 2 recent UTIs.  Call if culture positive.  See physical therapy for mild overactive bladder.  She has had a previous stroke and does not want more medication at this stage for her milder symptoms.  I will see her as needed  There are no diagnoses linked to this encounter.  No follow-ups on file.  Martina Sinner, MD  [facility] [address], [address] [place], [place] [address] [phone]

## 2023-05-12 LAB — CULTURE, URINE COMPREHENSIVE

## 2023-05-16 ENCOUNTER — Ambulatory Visit: Payer: No Typology Code available for payment source | Admitting: Gastroenterology

## 2023-05-16 ENCOUNTER — Encounter: Payer: Self-pay | Admitting: Gastroenterology

## 2023-05-16 VITALS — BP 134/84 | HR 85 | Temp 98.0°F | Ht 62.0 in | Wt 248.2 lb

## 2023-05-16 DIAGNOSIS — K52832 Lymphocytic colitis: Secondary | ICD-10-CM

## 2023-05-16 MED ORDER — BUDESONIDE 3 MG PO CPEP
ORAL_CAPSULE | ORAL | 0 refills | Status: AC
Start: 2023-06-15 — End: 2023-07-13

## 2023-05-16 MED ORDER — BUDESONIDE 3 MG PO CPEP
9.0000 mg | ORAL_CAPSULE | Freq: Every day | ORAL | 0 refills | Status: DC
Start: 2023-05-16 — End: 2024-03-21

## 2023-05-16 NOTE — Progress Notes (Signed)
Arlyss Repress, MD [address]  [address]  [place], [place] [address]  Main: [phone]  Fax: [phone]    Gastroenterology Consultation  Referring Provider:     Judy Pimple, MD Primary Care Physician:  Tower, Audrie Gallus, MD Primary Gastroenterologist:  Dr. Lavon Paganini Reason for Consultation: Lymphocytic colitis        HPI:   Carol Johnson is a 57 y.o. female referred by Dr. Milinda Antis, Audrie Gallus, MD  for consultation & management of lymphocytic colitis.  Patient is diagnosed with lymphocytic colitis based on the colonoscopy in 12/2014 by Dr. Arlyce Dice for workup of chronic diarrhea.  Apparently, patient has been on Lialda for management of lymphocytic colitis.  She had a repeat colonoscopy by Dr. Lavon Paganini in 01/2021 when she established care with  GI.  The biopsies again confirm lymphocytic colitis.  Apparently patient was offered to treat her lymphocytic colitis with budesonide, however because it is a steroid, she refused to take and continue Lialda only.  She now presents with ongoing symptoms of watery diarrhea about 4 times a day, triggered after a meal.  She does state that she is a stress eater, bread and chips are her weakness.  She has been gaining weight.  She reports that whenever she does not eat, she feels nauseous and eats in order to curtail nausea.  Mild normocytic anemia.  Patient wanted to switch provider to [facility] because she lives locally in [place].  NSAIDs: None  Antiplts/Anticoagulants/Anti thrombotics: None  GI Procedures:  Colonoscopy 02/18/2021 - One less than 1 mm polyp in the ascending colon, removed with a cold biopsy forceps. Resected and retrieved. - Normal mucosa in the entire examined colon. Biopsied. - Non- bleeding external and internal hemorrhoids. Diagnosis 1. Surgical [P], colon, ascending, polyp (1) - DIMINUTIVE TUBULAR ADENOMA - NEGATIVE FOR HIGH-GRADE DYSPLASIA OR MALIGNANCY 2. Surgical [P], random right colon sites -  LYMPHOCYTIC COLITIS. SEE NOTE 3. Surgical [P], random left colon sites - LYMPHOCYTIC COLITIS. SEE NOTE  Colonoscopy 01/02/2015 Diagnosis Surgical [P], random sites - SURFACE INTRAEPITHELIAL LYMPHOCYTOSIS, CONSISTENT WITH LYMPHOCYTIC COLITIS. - NO EVIDENCE OF INFLAMMATORY BOWEL DISEASE. - NEGATIVE FOR DYSPLASIA OR MALIGNANCY.  Upper endoscopy 05/03/2014 Stomach, biopsy - REACTIVE GASTROPATHY WITH MILD CHRONIC INFLAMMATION. NO HELICOBACTER PYLORI, DYSPLASIA OR MALIGNANCY.   Past Medical History:  Diagnosis Date   Abdominal pain, unspecified site    Allergic rhinitis    Anxiety    Asthma    Depression    DVT (deep venous thrombosis) (HCC) 2016   DVT of deep femoral vein, left (HCC)    Fibroid uterus    GERD (gastroesophageal reflux disease)    Hypercholesterolemia    Hypertension    Internal hemorrhoid    Irritable bowel syndrome    Lymphocytic colitis 01/02/2015   Pneumonia 1998   Stroke (HCC) 2020   left sided weekness   Ulcerative colitis (HCC)    URI (upper respiratory infection)    Uterine fibroid    Vitamin B12 deficiency     Past Surgical History:  Procedure Laterality Date   CESAREAN SECTION  08/30/2002   CHOLECYSTECTOMY     laparoscopic "gallstones"   ESOPHAGOGASTRODUODENOSCOPY (EGD) WITH PROPOFOL N/A 05/03/2014   Procedure: ESOPHAGOGASTRODUODENOSCOPY (EGD) WITH PROPOFOL;  Surgeon: Louis Meckel, MD;  Location: [facility];  Service: Endoscopy;  Laterality: N/A;   UTERINE FIBROID SURGERY       Current Outpatient Medications:    acetaminophen (TYLENOL) 325 MG tablet, Take 2 tablets (650  mg total) by mouth every 4 (four) hours as needed for mild pain (or temp > 37.5 C (99.5 F))., Disp:  , Rfl:    albuterol (PROVENTIL) (2.5 MG/3ML) 0.083% nebulizer solution, Take 3 mLs (2.5 mg total) by nebulization every 6 (six) hours as needed for wheezing or shortness of breath., Disp: 150 mL, Rfl: 1   albuterol (VENTOLIN HFA) 108 (90 Base) MCG/ACT inhaler, INHALE 2 PUFFS  INTO THE LUNGS EVERY 4 (FOUR) HOURS AS NEEDED FOR WHEEZING, COUGH, OR SHORTNESS OF BREATH, Disp: 6.7 each, Rfl: 3   atorvastatin (LIPITOR) 80 MG tablet, TAKE 1 TABLET BY MOUTH DAILY AT 6 PM., Disp: 90 tablet, Rfl: 2   budesonide (ENTOCORT EC) 3 MG 24 hr capsule, Take 3 capsules (9 mg total) by mouth daily., Disp: 90 capsule, Rfl: 0   [START ON 06/15/2023] budesonide (ENTOCORT EC) 3 MG 24 hr capsule, Take 2 capsules (6 mg total) by mouth daily for 14 days, THEN 1 capsule (3 mg total) daily for 14 days., Disp: 42 capsule, Rfl: 0   citalopram (CELEXA) 20 MG tablet, TAKE 1 TABLET BY MOUTH EVERYDAY AT BEDTIME, Disp: 90 tablet, Rfl: 3   COLLAGEN PO, Take by mouth., Disp: , Rfl:    Elderberry 500 MG CAPS, Take by mouth., Disp: , Rfl:    fexofenadine (ALLEGRA) 180 MG tablet, Take 180 mg by mouth daily as needed for allergies., Disp: , Rfl:    fluticasone (FLONASE) 50 MCG/ACT nasal spray, Place 2 sprays into both nostrils daily., Disp: 16 g, Rfl: 6   loperamide (IMODIUM) 2 MG capsule, Take 1 capsule (2 mg total) by mouth as needed for diarrhea or loose stools. (Patient taking differently: Take 2 mg by mouth at bedtime.), Disp: 30 capsule, Rfl: 0   Multiple Vitamin (MULTI VITAMIN DAILY PO), Take by mouth., Disp: , Rfl:    NON FORMULARY, Advocare Spark, Disp: , Rfl:    pantoprazole (PROTONIX) 40 MG tablet, TAKE 1 TABLET BY MOUTH EVERY DAY, Disp: 90 tablet, Rfl: 2   rivaroxaban (XARELTO) 20 MG TABS tablet, TAKE 1 TABLET BY MOUTH DAILY WITH SUPPER, Disp: 90 tablet, Rfl: 3   traZODone (DESYREL) 50 MG tablet, TAKE 1 TO 2 TABLETS BY MOUTH AT BEDTIME AS NEEDED FOR SLEEP, Disp: 180 tablet, Rfl: 1   Family History  Problem Relation Age of Onset   Hyperlipidemia Mother    Mitral valve prolapse Mother    Heart disease Mother        arrhythmia   Pulmonary fibrosis Father    Colon cancer Paternal Aunt    Heart disease Paternal Aunt    Heart disease Paternal Uncle    Heart failure Maternal Grandmother    Uterine  cancer Paternal Grandmother        with mets to the colon    Kidney disease Cousin    Esophageal cancer Neg Hx    Stomach cancer Neg Hx    Rectal cancer Neg Hx      Social History   Tobacco Use   Smoking status: Former    Current packs/day: 0.00    Average packs/day: 0.1 packs/day for 10.0 years (1.0 ttl pk-yrs)    Types: Cigarettes    Start date: 12/08/2007    Quit date: 12/07/2017    Years since quitting: 5.4   Smokeless tobacco: Never  Vaping Use   Vaping status: Never Used  Substance Use Topics   Alcohol use: Yes    Alcohol/week: 0.0 standard drinks of alcohol  Comment: rarely   Drug use: No    Allergies as of 05/16/2023 - Review Complete 05/16/2023  Allergen Reaction Noted   Dalbavancin Rash 11/02/2021   Zoloft [sertraline hcl] Diarrhea 04/19/2014    Review of Systems:    All systems reviewed and negative except where noted in HPI.   Physical Exam:  BP 134/84 (BP Location: Left Arm, Patient Position: Sitting, Cuff Size: Large)   Pulse 85   Temp 98 F (36.7 C) (Oral)   Ht 5\' 2"  (1.575 m)   Wt 248 lb 4 oz (112.6 kg)   LMP 11/29/2015   BMI 45.41 kg/m  Patient's last menstrual period was 11/29/2015.  General:   Alert,  Well-developed, well-nourished, pleasant and cooperative in NAD Head:  Normocephalic and atraumatic. Eyes:  Sclera clear, no icterus.   Conjunctiva pink. Ears:  Normal auditory acuity. Nose:  No deformity, discharge, or lesions. Mouth:  No deformity or lesions,oropharynx pink & moist. Neck:  Supple; no masses or thyromegaly. Lungs:  Respirations even and unlabored.  Clear throughout to auscultation.   No wheezes, crackles, or rhonchi. No acute distress. Heart:  Regular rate and rhythm; no murmurs, clicks, rubs, or gallops. Abdomen:  Normal bowel sounds. Soft, morbidly obese, non-tender and non-distended without masses, hepatosplenomegaly or hernias noted.  No guarding or rebound tenderness.   Rectal: Not performed Msk:  Symmetrical without  gross deformities. Good, equal movement & strength bilaterally. Pulses:  Normal pulses noted. Extremities:  No clubbing or edema.  No cyanosis. Neurologic:  Alert and oriented x3;  grossly normal neurologically. Skin:  Intact without significant lesions or rashes. No jaundice. Psych:  Alert and cooperative. Normal mood and affect.  Imaging Studies: No abdominal imaging  Assessment and Plan:   Carol Johnson is a 57 y.o. Caucasian female with hypertension, hyperlipidemia, lymphocytic colitis diagnosed in 12/2014 has been maintained on Lialda  Lymphocytic colitis, ongoing diarrhea Advised patient to stop Lialda and try budesonide, discussed about risks and benefits of budesonide, less systemic side effects compared to prednisone Discussed about stopping Protonix which she has been on for several years which could be the potential cause for lymphocytic colitis Check celiac disease panel which can be associated with lymphocytic colitis She can take Imodium as needed  Morbid obesity Stress eater and high carbohydrate intake Discussed regarding weight loss management, healthy diet, portion control.  Patient reports that stress is a trigger for her increased intake of snacking, eating junk foods which she also finds it is an excuse although she knows to differentiate healthy foods from unhealthy foods Advised patient to discuss with PCP regarding trial of GLP-1 for management of weight loss   Follow up in in 3 months with PA-C, Lavonna Monarch, MD

## 2023-05-16 NOTE — Patient Instructions (Signed)
Take Budesonide 3 pills daily for 30 days and then take 2 pills daily for 2 weeks, drop to 1 pill daily for 2 weeks and stop taking after that. If your diarrhea returns please let us know.  Please call Morrie Sheldon Dr. Allegra Lai Medical assistant at [phone] if you have any problems.

## 2023-05-18 LAB — CELIAC DISEASE PANEL
Endomysial IgA: NEGATIVE
IgA/Immunoglobulin A, Serum: 332 mg/dL (ref 87–352)
Transglutaminase IgA: <2 U/mL (ref 0–3)

## 2023-05-19 ENCOUNTER — Ambulatory Visit
Admission: RE | Admit: 2023-05-19 | Discharge: 2023-05-19 | Disposition: A | Discharge status: Home / Self Care | Payer: No Typology Code available for payment source | Source: Ambulatory Visit | Attending: Urology | Admitting: Urology

## 2023-05-19 DIAGNOSIS — N39498 Other specified urinary incontinence: Secondary | ICD-10-CM | POA: Insufficient documentation

## 2023-05-25 ENCOUNTER — Encounter: Payer: Self-pay | Admitting: Family Medicine

## 2023-05-31 ENCOUNTER — Ambulatory Visit: Payer: No Typology Code available for payment source | Admitting: Family Medicine

## 2023-05-31 ENCOUNTER — Encounter: Payer: Self-pay | Admitting: Family Medicine

## 2023-05-31 VITALS — BP 128/78 | HR 85 | Temp 98.1°F | Ht 62.0 in | Wt 228.4 lb

## 2023-05-31 DIAGNOSIS — Z79899 Other long term (current) drug therapy: Secondary | ICD-10-CM | POA: Insufficient documentation

## 2023-05-31 DIAGNOSIS — K52832 Lymphocytic colitis: Secondary | ICD-10-CM

## 2023-05-31 DIAGNOSIS — E876 Hypokalemia: Secondary | ICD-10-CM

## 2023-05-31 DIAGNOSIS — R197 Diarrhea, unspecified: Secondary | ICD-10-CM

## 2023-05-31 LAB — CBC WITH DIFFERENTIAL/PLATELET
Basophils Absolute: 0.1 10*3/uL (ref 0.0–0.1)
Basophils Relative: 0.8 % (ref 0.0–3.0)
Eosinophils Absolute: 0.4 10*3/uL (ref 0.0–0.7)
Eosinophils Relative: 3.4 % (ref 0.0–5.0)
HCT: 35.5 % — ABNORMAL LOW (ref 36.0–46.0)
Hemoglobin: 10.9 g/dL — ABNORMAL LOW (ref 12.0–15.0)
Lymphocytes Relative: 17 % (ref 12.0–46.0)
Lymphs Abs: 1.8 10*3/uL (ref 0.7–4.0)
MCHC: 30.7 g/dL (ref 30.0–36.0)
MCV: 71 fL — ABNORMAL LOW (ref 78.0–100.0)
Monocytes Absolute: 0.8 10*3/uL (ref 0.1–1.0)
Monocytes Relative: 7.2 % (ref 3.0–12.0)
Neutro Abs: 7.6 10*3/uL (ref 1.4–7.7)
Neutrophils Relative %: 71.6 % (ref 43.0–77.0)
Platelets: 449 10*3/uL — ABNORMAL HIGH (ref 150.0–400.0)
RBC: 5 Mil/uL (ref 3.87–5.11)
RDW: 17.4 % — ABNORMAL HIGH (ref 11.5–15.5)
WBC: 10.7 10*3/uL — ABNORMAL HIGH (ref 4.0–10.5)

## 2023-05-31 LAB — HEPATIC FUNCTION PANEL
ALT: 52 U/L — ABNORMAL HIGH (ref 0–35)
AST: 43 U/L — ABNORMAL HIGH (ref 0–37)
Albumin: 4.2 g/dL (ref 3.5–5.2)
Alkaline Phosphatase: 99 U/L (ref 39–117)
Bilirubin, Direct: 0.1 mg/dL (ref 0.0–0.3)
Total Bilirubin: 0.6 mg/dL (ref 0.2–1.2)
Total Protein: 6.9 g/dL (ref 6.0–8.3)

## 2023-05-31 LAB — VITAMIN B12: Vitamin B-12: 383 pg/mL (ref 211–911)

## 2023-05-31 LAB — BASIC METABOLIC PANEL
BUN: 9 mg/dL (ref 6–23)
CO2: 27 meq/L (ref 19–32)
Calcium: 9.3 mg/dL (ref 8.4–10.5)
Chloride: 103 meq/L (ref 96–112)
Creatinine, Ser: 0.77 mg/dL (ref 0.40–1.20)
GFR: 84.9 mL/min (ref >60.00)
Glucose, Bld: 94 mg/dL (ref 70–99)
Potassium: 3 meq/L — ABNORMAL LOW (ref 3.5–5.1)
Sodium: 139 meq/L (ref 135–145)

## 2023-05-31 MED ORDER — POTASSIUM CHLORIDE CRYS ER 20 MEQ PO TBCR: 20.0000 meq | EXTENDED_RELEASE_TABLET | Freq: Every day | ORAL | 0 refills | Status: DC

## 2023-05-31 NOTE — Addendum Note (Signed)
Addended by: Roxy Manns A on: 05/31/2023 07:34 PM   Modules accepted: Orders

## 2023-05-31 NOTE — Assessment & Plan Note (Signed)
Pt has history of lymphocytic colitis and recently changed fro lialda to budesonide   Had episode last week with nausea/ dry heaves/ fever and diarrhea  She held the endocort with ? Of possible side effects Now diarrhea continues (more than a wee)- 8 watery stools per day at least (no blood) , it has woken her up at night several times also   Lab today Stool studies for cdiff and GI panel Pending Will cc her GI provider   Encouraged fluids  Discussed signs and symptoms of dehydration to watch for

## 2023-05-31 NOTE — Assessment & Plan Note (Signed)
Lab Results  Component Value Date   K 3.0 (L) 05/31/2023   Today- from diarrhea  Sent in klor con 20 meq to take daily for 10 days  Re check bmet 1 week

## 2023-05-31 NOTE — Patient Instructions (Addendum)
Labs today  Stool tests also  Keep drinking fluids  Eat bland   I will send note to your GI doctor as well   If symptoms suddenly worsen go to the ER

## 2023-05-31 NOTE — Assessment & Plan Note (Signed)
B12 level added to labs  °

## 2023-05-31 NOTE — Progress Notes (Signed)
Subjective:    Patient ID: Carol Johnson, female    DOB: Apr 22, 1966, 57 y.o.   MRN: [id number]  HPI  Wt Readings from Last 3 Encounters:  05/31/23 228 lb 6 oz (103.6 kg)  05/16/23 248 lb 4 oz (112.6 kg)  05/09/23 239 lb (108.4 kg)   41.77 kg/m  Vitals:   05/31/23 1236  BP: 128/78  Pulse: 85  Temp: 98.1 F (36.7 C)  SpO2: 96%   Pt presents for GI symptoms   Had a viral illness  N/v/d chills (dry heaving instead of vomiting)   Stools are still watery and urgent  (no blood)  Wake her up in the middle of the night  Exhausting   Aches/pains/chills Maia Plan- lasted about 5-7 days (gone since wed of last week)  Still some nausea / but not as bad   Tries to eat twice daily  Small sandwich -bland   Had a neg celiac panel that was negative    On budesonide (entocort) for lymphocytic colitis (this was the week before she got sick)- she stopped it   Was changed to that from lialda    Lab Results  Component Value Date   VITAMINB12 354 09/24/2022   Keeping up with fluids On protonix  Drinking a vitamin drink  Ginger ale    Patient Active Problem List   Diagnosis Date Noted   Current use of proton pump inhibitor 05/31/2023   Abnormal urinalysis 02/14/2023   UTI (urinary tract infection) 02/10/2023   Mild intermittent asthma with exacerbation 01/25/2023   Right ear pain 01/25/2023   Allergy to drug 11/02/2021   Chronic neck pain 01/12/2021   Vertigo 10/29/2020   Hearing loss 10/29/2020   Hypercoagulable state (HCC) 12/18/2019   Hair loss 12/03/2019   Aneurysm of anterior cerebral artery 12/03/2019   Hemiparesis affecting nondominant side as late effect of stroke (HCC) 07/24/2019   Muscle weakness (generalized) 06/11/2019   Hemiplegia and hemiparesis following cerebral infarction affecting left non-dominant side (HCC) 06/11/2019   Major depressive disorder    Ulcerative colitis with complication (HCC)    H/O: CVA (cerebrovascular accident) 05/23/2019    Thrombocytosis 05/23/2019   Coronary artery disease involving native coronary artery of native heart without angina pectoris 03/21/2018   Coronary artery calcification 02/10/2018   Obesity (BMI 30-39.9) 02/10/2018   Former smoker 01/15/2018   Carpal tunnel syndrome 12/21/2017   Stress incontinence 12/21/2017   Paresthesia of both feet 07/04/2017   Always thirsty 07/04/2017   Lymphocytic colitis 01/16/2015   Anemia, iron deficiency 01/16/2015   Acute diarrhea 11/27/2014   Insomnia 11/27/2014   History of DVT (deep vein thrombosis) 09/17/2014   OSA (obstructive sleep apnea) 09/02/2014   Gastritis, chronic 06/06/2014   Internal hemorrhoids with other complication 04/19/2014   Routine general medical examination at a health care facility 10/14/2011   Thyroid nodule 05/16/2011   Rectal bleeding 11/24/2010   Hemorrhoids, internal 11/24/2010   Irritable bowel syndrome (IBS) 11/24/2010   Hyperlipidemia 07/18/2009   Essential hypertension 07/18/2009   ANXIETY 01/31/2008   Depression with anxiety 01/31/2008   GASTRIC ULCER, HX OF 01/31/2008   FIBROIDS, UTERUS 11/13/2007   Allergic rhinitis 11/13/2007   Asthma 11/13/2007   GERD 11/13/2007   IRRITABLE BOWEL SYNDROME 11/13/2007   INTERNAL HEMORRHOIDS 11/12/2003   Past Medical History:  Diagnosis Date   Abdominal pain, unspecified site    Allergic rhinitis    Anxiety    Asthma    Depression    DVT (deep  venous thrombosis) (HCC) 2016   DVT of deep femoral vein, left (HCC)    Fibroid uterus    GERD (gastroesophageal reflux disease)    Hypercholesterolemia    Hypertension    Internal hemorrhoid    Irritable bowel syndrome    Lymphocytic colitis 01/02/2015   Pneumonia 1998   Stroke (HCC) 2020   left sided weekness   Ulcerative colitis (HCC)    URI (upper respiratory infection)    Uterine fibroid    Vitamin B12 deficiency    Past Surgical History:  Procedure Laterality Date   CESAREAN SECTION  08/30/2002   CHOLECYSTECTOMY      laparoscopic "gallstones"   ESOPHAGOGASTRODUODENOSCOPY (EGD) WITH PROPOFOL N/A 05/03/2014   Procedure: ESOPHAGOGASTRODUODENOSCOPY (EGD) WITH PROPOFOL;  Surgeon: Louis Meckel, MD;  Location: [facility];  Service: Endoscopy;  Laterality: N/A;   UTERINE FIBROID SURGERY     Social History   Tobacco Use   Smoking status: Former    Current packs/day: 0.00    Average packs/day: 0.1 packs/day for 10.0 years (1.0 ttl pk-yrs)    Types: Cigarettes    Start date: 12/08/2007    Quit date: 12/07/2017    Years since quitting: 5.4   Smokeless tobacco: Never  Vaping Use   Vaping status: Never Used  Substance Use Topics   Alcohol use: Yes    Alcohol/week: 0.0 standard drinks of alcohol    Comment: rarely   Drug use: No   Family History  Problem Relation Age of Onset   Hyperlipidemia Mother    Mitral valve prolapse Mother    Heart disease Mother        arrhythmia   Pulmonary fibrosis Father    Colon cancer Paternal Aunt    Heart disease Paternal Aunt    Heart disease Paternal Uncle    Heart failure Maternal Grandmother    Uterine cancer Paternal Grandmother        with mets to the colon    Kidney disease Cousin    Esophageal cancer Neg Hx    Stomach cancer Neg Hx    Rectal cancer Neg Hx    Allergies  Allergen Reactions   Dalbavancin Rash    Lips, skin tingling rash   Zoloft [Sertraline Hcl] Diarrhea   Current Outpatient Medications on File Prior to Visit  Medication Sig Dispense Refill   acetaminophen (TYLENOL) 325 MG tablet Take 2 tablets (650 mg total) by mouth every 4 (four) hours as needed for mild pain (or temp > 37.5 C (99.5 F)).     albuterol (PROVENTIL) (2.5 MG/3ML) 0.083% nebulizer solution Take 3 mLs (2.5 mg total) by nebulization every 6 (six) hours as needed for wheezing or shortness of breath. 150 mL 1   albuterol (VENTOLIN HFA) 108 (90 Base) MCG/ACT inhaler INHALE 2 PUFFS INTO THE LUNGS EVERY 4 (FOUR) HOURS AS NEEDED FOR WHEEZING, COUGH, OR SHORTNESS OF BREATH 6.7  each 3   atorvastatin (LIPITOR) 80 MG tablet TAKE 1 TABLET BY MOUTH DAILY AT 6 PM. 90 tablet 2   citalopram (CELEXA) 20 MG tablet TAKE 1 TABLET BY MOUTH EVERYDAY AT BEDTIME 90 tablet 3   COLLAGEN PO Take by mouth.     Elderberry 500 MG CAPS Take by mouth.     fexofenadine (ALLEGRA) 180 MG tablet Take 180 mg by mouth daily as needed for allergies.     fluticasone (FLONASE) 50 MCG/ACT nasal spray Place 2 sprays into both nostrils daily. 16 g 6   loperamide (IMODIUM) 2 MG  capsule Take 1 capsule (2 mg total) by mouth as needed for diarrhea or loose stools. (Patient taking differently: Take 2 mg by mouth at bedtime.) 30 capsule 0   Multiple Vitamin (MULTI VITAMIN DAILY PO) Take by mouth.     NON FORMULARY Advocare Spark     pantoprazole (PROTONIX) 40 MG tablet TAKE 1 TABLET BY MOUTH EVERY DAY 90 tablet 2   rivaroxaban (XARELTO) 20 MG TABS tablet TAKE 1 TABLET BY MOUTH DAILY WITH SUPPER 90 tablet 3   traZODone (DESYREL) 50 MG tablet TAKE 1 TO 2 TABLETS BY MOUTH AT BEDTIME AS NEEDED FOR SLEEP 180 tablet 1   budesonide (ENTOCORT EC) 3 MG 24 hr capsule Take 3 capsules (9 mg total) by mouth daily. 90 capsule 0   [START ON 06/15/2023] budesonide (ENTOCORT EC) 3 MG 24 hr capsule Take 2 capsules (6 mg total) by mouth daily for 14 days, THEN 1 capsule (3 mg total) daily for 14 days. 42 capsule 0   No current facility-administered medications on file prior to visit.    Review of Systems  Constitutional:  Positive for fatigue. Negative for activity change, appetite change, fever and unexpected weight change.  HENT:  Negative for congestion, ear pain, rhinorrhea, sinus pressure and sore throat.   Eyes:  Negative for pain, redness and visual disturbance.  Respiratory:  Negative for cough, shortness of breath and wheezing.   Cardiovascular:  Negative for chest pain and palpitations.  Gastrointestinal:  Positive for abdominal distention, diarrhea and nausea. Negative for abdominal pain, anal bleeding, blood in  stool, constipation, rectal pain and vomiting.       Abd is bloated Some cramping with bms but not as bad as it was  Nausea is improved    Endocrine: Negative for polydipsia and polyuria.  Genitourinary:  Negative for dysuria, frequency and urgency.  Musculoskeletal:  Negative for arthralgias, back pain and myalgias.  Skin:  Negative for pallor and rash.  Allergic/Immunologic: Negative for environmental allergies.  Neurological:  Negative for dizziness, syncope and headaches.       Generalized weakness  Hematological:  Negative for adenopathy. Does not bruise/bleed easily.  Psychiatric/Behavioral:  Negative for decreased concentration and dysphoric mood. The patient is not nervous/anxious.        Objective:   Physical Exam Constitutional:      General: She is not in acute distress.    Appearance: Normal appearance. She is well-developed. She is obese. She is not ill-appearing or diaphoretic.  HENT:     Head: Normocephalic and atraumatic.     Mouth/Throat:     Mouth: Mucous membranes are moist.  Eyes:     General: No scleral icterus.    Conjunctiva/sclera: Conjunctivae normal.     Pupils: Pupils are equal, round, and reactive to light.  Cardiovascular:     Rate and Rhythm: Normal rate and regular rhythm.     Heart sounds: Normal heart sounds.  Pulmonary:     Effort: Pulmonary effort is normal. No respiratory distress.     Breath sounds: Normal breath sounds. No wheezing or rales.  Abdominal:     General: Abdomen is protuberant. Bowel sounds are increased. There is no distension or abdominal bruit.     Palpations: Abdomen is soft. There is no hepatomegaly, splenomegaly or mass.     Tenderness: There is no abdominal tenderness. There is no right CVA tenderness, left CVA tenderness, guarding or rebound.     Hernia: No hernia is present.     Comments:  Mildly increase bs Not high pitched   Musculoskeletal:     Cervical back: Normal range of motion and neck supple.   Lymphadenopathy:     Cervical: No cervical adenopathy.  Skin:    General: Skin is warm and dry.     Coloration: Skin is not pale.     Findings: No erythema.     Comments: Normal skin turgor   Neurological:     Mental Status: She is alert.  Psychiatric:        Mood and Affect: Mood normal.           Assessment & Plan:   Problem List Items Addressed This Visit       Digestive   Acute diarrhea - Primary    Pt has history of lymphocytic colitis and recently changed fro lialda to budesonide   Had episode last week with nausea/ dry heaves/ fever and diarrhea  She held the endocort with ? Of possible side effects Now diarrhea continues (more than a wee)- 8 watery stools per day at least (no blood) , it has woken her up at night several times also   Lab today Stool studies for cdiff and GI panel Pending Will cc her GI provider   Encouraged fluids  Discussed signs and symptoms of dehydration to watch for        Relevant Orders   Basic metabolic panel   CBC with Differential/Platelet   Hepatic function panel   C. difficile GDH and Toxin A/B   Gastrointestinal Pathogen Pnl RT, PCR   Lymphocytic colitis    Reviewed last GI note Started entocort (change from lialda)   Now had episode of n/v/d (? Viral) with chills followed by just diarrhea and weakness  Lab and stool studies ordered She is holding entocort because of concern of possible side effects  See a/p for acute diarrhea Will update GI       Relevant Orders   Basic metabolic panel   CBC with Differential/Platelet   Hepatic function panel     Other   Current use of proton pump inhibitor    B12 level added to labs       Relevant Orders   Vitamin B12

## 2023-05-31 NOTE — Assessment & Plan Note (Signed)
Reviewed last GI note Started entocort (change from lialda)   Now had episode of n/v/d (? Viral) with chills followed by just diarrhea and weakness  Lab and stool studies ordered She is holding entocort because of concern of possible side effects  See a/p for acute diarrhea Will update GI

## 2023-06-01 ENCOUNTER — Telehealth: Payer: Self-pay

## 2023-06-01 ENCOUNTER — Other Ambulatory Visit: Payer: Self-pay | Admitting: Family Medicine

## 2023-06-01 NOTE — Telephone Encounter (Signed)
Patient return my call and returned patient call and she states symptoms have not been going on for a month. She states she saw [place] on 05/16/2023. She began taking the Budesonide on 05/17/2023. She states that her symptoms began on 05/22/2023 and she thought she had the stomach Flu. She states that she has loss 20 pounds since she was seen by [place]. She states on Monday Tuesday and Wednesday last week she had fever, chills, abdominal pain, and body aches. She has not had this since then . She has still had the diarrhea, nausea and vomiting. She states she stopped taking the Budesonide because she did not want to medication to make her symptoms worse or the medication causing her symptoms. She states she is having diarrhea 7 to 8 times a day denies any blood In the diarrhea. Has had vomiting at least once a day and vomited last night. She states that she is able to keep liquids down and she is trying to eat bland foods. She states that she ate [place] cheese and bread today. She states she is scared to go back on the Budesonide because she does not want it to make her symptoms worse. Her PCP did order a GI Path stool test and C diff but results are not back. She wants to know what you recommended and if she needs a CT scan

## 2023-06-01 NOTE — Telephone Encounter (Signed)
Patient left a voicemail on my voicemail stating that her PCP Dr. Milinda Antis his CC Dr. Allegra Lai on her office visit from yesterday. She states she has been sick for the last month and lost 20 pounds. She wants to know what is next. Return patient call to get more information and left a message for call back

## 2023-06-02 LAB — C. DIFFICILE GDH AND TOXIN A/B
GDH ANTIGEN: NOT DETECTED
MICRO NUMBER:: 15542404
SPECIMEN QUALITY:: ADEQUATE
TOXIN A AND B: NOT DETECTED

## 2023-06-02 NOTE — Telephone Encounter (Signed)
Patient return call and left  a massage for call back return patient call and she verbalized understanding of instructions

## 2023-06-02 NOTE — Telephone Encounter (Signed)
Called and left a message for call back  

## 2023-06-03 LAB — GASTROINTESTINAL PATHOGEN PNL
CampyloBacter Group: NOT DETECTED
Norovirus GI/GII: NOT DETECTED
Rotavirus A: NOT DETECTED
Salmonella species: NOT DETECTED
Shiga Toxin 1: NOT DETECTED
Shiga Toxin 2: NOT DETECTED
Shigella Species: NOT DETECTED
Vibrio Group: NOT DETECTED
Yersinia enterocolitica: NOT DETECTED

## 2023-06-06 ENCOUNTER — Telehealth: Payer: Self-pay

## 2023-06-06 ENCOUNTER — Telehealth: Payer: Self-pay | Admitting: Family Medicine

## 2023-06-06 DIAGNOSIS — K52832 Lymphocytic colitis: Secondary | ICD-10-CM

## 2023-06-06 DIAGNOSIS — R197 Diarrhea, unspecified: Secondary | ICD-10-CM

## 2023-06-06 MED ORDER — ONDANSETRON HCL 4 MG PO TABS: 4.0000 mg | ORAL_TABLET | Freq: Three times a day (TID) | ORAL | 0 refills | Status: DC | PRN

## 2023-06-06 MED ORDER — LOPERAMIDE HCL 2 MG PO TABS: 4.0000 mg | ORAL_TABLET | Freq: Four times a day (QID) | ORAL | 0 refills | Status: AC | PRN

## 2023-06-06 MED ORDER — PREDNISONE 10 MG PO TABS: 40.0000 mg | ORAL_TABLET | Freq: Every day | ORAL | 0 refills | Status: AC

## 2023-06-06 NOTE — Addendum Note (Signed)
Addended by: Radene Knee L on: 06/06/2023 03:47 PM   Modules accepted: Orders

## 2023-06-06 NOTE — Telephone Encounter (Signed)
I went ahead with an urgent referral to her GI and will Cc Dr Allegra Lai  I think they did actually touch base by phone based on phone note from 3:47 pm so may not actually need it  Thanks for the heads up   I had the protonix as daily  How long has she been taking bid and is it helping more with her acid reflux?

## 2023-06-06 NOTE — Telephone Encounter (Signed)
Patient called in and stated that she is still experiencing some diarrhea. She stated that she is currently taking two pantoprazole (PROTONIX) 40 MG tablet instead of one and may need another prescription sent in. She stated that she hasn't been able to get in contact with her GI doctor. Thank you!

## 2023-06-06 NOTE — Telephone Encounter (Signed)
Stool studies negative for infection. With ongoing diarrhea and low potassium on recent labs, lets recheck her electrolytes BMP, magnesium and Phos For immediate relief, recommend Zofran 4 mg every 6-8 hours as needed for nausea, Imodium 4 mg every 6 hours for diarrhea.  She can try budesonide one more time 9 mg daily, if she is not willing to try budesonide, I recommend short course of prednisone 40 mg daily for 1 to 2 weeks  RV

## 2023-06-06 NOTE — Telephone Encounter (Signed)
Patient verbalized understanding of results. She will come for blood work tomorrow. She states she is scared to start the Budesonide again she wants to try the Prednisone. Put a reminder to call and check on patient In 1 week

## 2023-06-06 NOTE — Telephone Encounter (Signed)
Spoke with pt and GI did f/u with her already and she sticking with their plain

## 2023-06-06 NOTE — Telephone Encounter (Signed)
Patient left a voicemail at 9:41am on voicemail stating that her stool test are back. She states she would like you to take a look at it. She states she is eating and drinking but she stays very nausea all the time. She states she is still having diarrhea 6 to 8 times a day. She is very weak. She does not know what to do.

## 2023-06-07 ENCOUNTER — Other Ambulatory Visit: Payer: Self-pay | Admitting: Gastroenterology

## 2023-06-07 ENCOUNTER — Telehealth: Payer: Self-pay

## 2023-06-07 DIAGNOSIS — K52832 Lymphocytic colitis: Secondary | ICD-10-CM

## 2023-06-07 NOTE — Telephone Encounter (Signed)
PCP placed a urgent referral for patient for diarrhea and Lymphocytic colitis do you want to see patient in the office or stick with the plan we gave patient yesterday

## 2023-06-07 NOTE — Telephone Encounter (Signed)
Documented this in referral and closed

## 2023-06-09 ENCOUNTER — Other Ambulatory Visit: Payer: No Typology Code available for payment source

## 2023-06-13 ENCOUNTER — Telehealth: Payer: Self-pay

## 2023-06-13 NOTE — Telephone Encounter (Signed)
-----   Message from [facility] sent at 06/06/2023  3:47 PM EDT ----- Check on pt in 1 week

## 2023-06-13 NOTE — Telephone Encounter (Signed)
Patient states she is still having diarrhea 5 times a day. She states she is nausea all the time. She is eating and drinking and has gain some of the weight back. She states she will come for labs today before 4:30.

## 2023-06-13 NOTE — Telephone Encounter (Signed)
Called and left a message for call back

## 2023-06-14 LAB — PHOSPHORUS: Phosphorus: 2.9 mg/dL — ABNORMAL LOW (ref 3.0–4.3)

## 2023-06-14 LAB — MAGNESIUM: Magnesium: 1.5 mg/dL — ABNORMAL LOW (ref 1.6–2.3)

## 2023-06-14 LAB — BASIC METABOLIC PANEL
BUN/Creatinine Ratio: 15 (ref 9–23)
BUN: 10 mg/dL (ref 6–24)
CO2: 26 mmol/L (ref 20–29)
Calcium: 9.1 mg/dL (ref 8.7–10.2)
Chloride: 106 mmol/L (ref 96–106)
Creatinine, Ser: 0.65 mg/dL (ref 0.57–1.00)
Glucose: 96 mg/dL (ref 70–99)
Potassium: 3.4 mmol/L — ABNORMAL LOW (ref 3.5–5.2)
Sodium: 145 mmol/L — ABNORMAL HIGH (ref 134–144)
eGFR: 102 mL/min/{1.73_m2} (ref >59)

## 2023-06-15 ENCOUNTER — Telehealth: Payer: Self-pay

## 2023-06-15 MED ORDER — POTASSIUM PHOSPHATE MONOBASIC 500 MG PO TABS: 500.0000 mg | ORAL_TABLET | Freq: Every day | ORAL | 0 refills | Status: AC

## 2023-06-15 MED ORDER — SLOW MAGNESIUM/CALCIUM 64-106 MG PO TBEC: 2.0000 | DELAYED_RELEASE_TABLET | Freq: Every day | ORAL | 0 refills | Status: AC

## 2023-06-15 NOTE — Telephone Encounter (Signed)
Patient called back and verbalized understanding of results

## 2023-06-15 NOTE — Telephone Encounter (Signed)
Patient states she is taking the Prednisone. Asked her how many more days she had of it and she said she does not know she would have to check the bottle and the bottle is at home and she is at work. About the Viberzi asked patient if she still had her gallbladder and she said not she had her gallbladder removed so she will not be able to take the Viberzi

## 2023-06-15 NOTE — Telephone Encounter (Signed)
Called and left a message for call back. We do not have samples of the Viberzi 75mg  only have the 100mg 

## 2023-06-15 NOTE — Telephone Encounter (Signed)
Patient verbalized understanding of results. Sent medication to the pharmacy

## 2023-06-15 NOTE — Telephone Encounter (Signed)
Did she finish her prednisone? Let's have her try viberzi 75mg  sapmples  RV

## 2023-06-15 NOTE — Telephone Encounter (Signed)
Called and left a message for call back sent mychart message also

## 2023-06-15 NOTE — Telephone Encounter (Signed)
-----   Message from [facility] sent at 06/15/2023 12:54 PM EDT ----- Please send prescription for Slow-Mag 1 to 2 tablets daily for 1 week Potassium phosphate 1 pill once a day for 1 week  RV

## 2023-06-15 NOTE — Telephone Encounter (Signed)
Recommend low FODMAP diet and try probiotics like Culturelle or Florastor or lactobacillus twice daily Please send her information on low FODMAP diet  [email or web address]   RV

## 2023-08-15 ENCOUNTER — Ambulatory Visit: Payer: No Typology Code available for payment source | Admitting: Gastroenterology

## 2023-08-15 ENCOUNTER — Ambulatory Visit: Payer: No Typology Code available for payment source | Admitting: Physician Assistant

## 2023-09-17 ENCOUNTER — Other Ambulatory Visit: Payer: Self-pay | Admitting: Gastroenterology

## 2023-09-17 ENCOUNTER — Other Ambulatory Visit: Payer: Self-pay | Admitting: Family Medicine

## 2023-09-19 NOTE — Telephone Encounter (Signed)
Spoke to pt, scheduled cpe for 10/06/23

## 2023-09-19 NOTE — Telephone Encounter (Signed)
Pt is due for her CPE on or after 09/26/23, please schedule and then route back to me to refill. Thanks (labs prior)

## 2023-09-20 ENCOUNTER — Telehealth: Payer: Self-pay | Admitting: Family Medicine

## 2023-09-20 DIAGNOSIS — D509 Iron deficiency anemia, unspecified: Secondary | ICD-10-CM

## 2023-09-20 DIAGNOSIS — E782 Mixed hyperlipidemia: Secondary | ICD-10-CM

## 2023-09-20 DIAGNOSIS — Z79899 Other long term (current) drug therapy: Secondary | ICD-10-CM

## 2023-09-20 DIAGNOSIS — D75839 Thrombocytosis, unspecified: Secondary | ICD-10-CM

## 2023-09-20 DIAGNOSIS — I10 Essential (primary) hypertension: Secondary | ICD-10-CM

## 2023-09-20 DIAGNOSIS — Z131 Encounter for screening for diabetes mellitus: Secondary | ICD-10-CM | POA: Insufficient documentation

## 2023-09-20 NOTE — Telephone Encounter (Signed)
-----   Message from Alvina Chou sent at 09/19/2023  4:10 PM EST ----- Regarding: Lab orders for [facility], 1.30.25 Patient is scheduled for CPX labs, please order future labs, Thanks , Camelia Eng

## 2023-09-29 ENCOUNTER — Other Ambulatory Visit: Payer: No Typology Code available for payment source

## 2023-09-29 DIAGNOSIS — E876 Hypokalemia: Secondary | ICD-10-CM

## 2023-09-29 DIAGNOSIS — I10 Essential (primary) hypertension: Secondary | ICD-10-CM

## 2023-09-29 DIAGNOSIS — E782 Mixed hyperlipidemia: Secondary | ICD-10-CM

## 2023-09-29 DIAGNOSIS — D509 Iron deficiency anemia, unspecified: Secondary | ICD-10-CM

## 2023-09-29 DIAGNOSIS — Z131 Encounter for screening for diabetes mellitus: Secondary | ICD-10-CM | POA: Diagnosis not present

## 2023-09-29 DIAGNOSIS — D75839 Thrombocytosis, unspecified: Secondary | ICD-10-CM

## 2023-09-29 DIAGNOSIS — Z79899 Other long term (current) drug therapy: Secondary | ICD-10-CM | POA: Diagnosis not present

## 2023-09-29 LAB — CBC WITH DIFFERENTIAL/PLATELET
Basophils Absolute: 0.1 10*3/uL (ref 0.0–0.1)
Basophils Relative: 0.8 % (ref 0.0–3.0)
Eosinophils Absolute: 0.6 10*3/uL (ref 0.0–0.7)
Eosinophils Relative: 7.2 % — ABNORMAL HIGH (ref 0.0–5.0)
HCT: 32.3 % — ABNORMAL LOW (ref 36.0–46.0)
Hemoglobin: 10 g/dL — ABNORMAL LOW (ref 12.0–15.0)
Lymphocytes Relative: 23.1 % (ref 12.0–46.0)
Lymphs Abs: 1.8 10*3/uL (ref 0.7–4.0)
MCHC: 30.8 g/dL (ref 30.0–36.0)
MCV: 68.6 fL — ABNORMAL LOW (ref 78.0–100.0)
Monocytes Absolute: 0.5 10*3/uL (ref 0.1–1.0)
Monocytes Relative: 6.6 % (ref 3.0–12.0)
Neutro Abs: 4.7 10*3/uL (ref 1.4–7.7)
Neutrophils Relative %: 62.3 % (ref 43.0–77.0)
Platelets: 420 10*3/uL — ABNORMAL HIGH (ref 150.0–400.0)
RBC: 4.71 Mil/uL (ref 3.87–5.11)
RDW: 17.4 % — ABNORMAL HIGH (ref 11.5–15.5)
WBC: 7.6 10*3/uL (ref 4.0–10.5)

## 2023-09-29 LAB — COMPREHENSIVE METABOLIC PANEL
ALT: 21 U/L (ref 0–35)
AST: 21 U/L (ref 0–37)
Albumin: 4.2 g/dL (ref 3.5–5.2)
Alkaline Phosphatase: 90 U/L (ref 39–117)
BUN: 8 mg/dL (ref 6–23)
CO2: 27 meq/L (ref 19–32)
Calcium: 9.7 mg/dL (ref 8.4–10.5)
Chloride: 104 meq/L (ref 96–112)
Creatinine, Ser: 0.73 mg/dL (ref 0.40–1.20)
GFR: 90.3 mL/min (ref >60.00)
Glucose, Bld: 98 mg/dL (ref 70–99)
Potassium: 4.2 meq/L (ref 3.5–5.1)
Sodium: 139 meq/L (ref 135–145)
Total Bilirubin: 0.4 mg/dL (ref 0.2–1.2)
Total Protein: 6.8 g/dL (ref 6.0–8.3)

## 2023-09-29 LAB — BASIC METABOLIC PANEL
BUN: 8 mg/dL (ref 6–23)
CO2: 27 meq/L (ref 19–32)
Calcium: 9.7 mg/dL (ref 8.4–10.5)
Chloride: 104 meq/L (ref 96–112)
Creatinine, Ser: 0.73 mg/dL (ref 0.40–1.20)
GFR: 90.3 mL/min (ref >60.00)
Glucose, Bld: 98 mg/dL (ref 70–99)
Potassium: 4.2 meq/L (ref 3.5–5.1)
Sodium: 139 meq/L (ref 135–145)

## 2023-09-29 LAB — LIPID PANEL
Cholesterol: 133 mg/dL (ref 0–200)
HDL: 36.8 mg/dL — ABNORMAL LOW (ref >39.00)
LDL Cholesterol: 69 mg/dL (ref 0–99)
NonHDL: 96.39
Total CHOL/HDL Ratio: 4
Triglycerides: 139 mg/dL (ref 0.0–149.0)
VLDL: 27.8 mg/dL (ref 0.0–40.0)

## 2023-09-29 LAB — FERRITIN: Ferritin: 2.3 ng/mL — ABNORMAL LOW (ref 10.0–291.0)

## 2023-09-29 LAB — IRON: Iron: 16 ug/dL — ABNORMAL LOW (ref 42–145)

## 2023-09-29 LAB — HEMOGLOBIN A1C: Hgb A1c MFr Bld: 6.3 % (ref 4.6–6.5)

## 2023-09-29 LAB — TSH: TSH: 1.68 u[IU]/mL (ref 0.35–5.50)

## 2023-09-29 LAB — VITAMIN B12: Vitamin B-12: 213 pg/mL (ref 211–911)

## 2023-09-29 LAB — VITAMIN D 25 HYDROXY (VIT D DEFICIENCY, FRACTURES): VITD: 18.9 ng/mL — ABNORMAL LOW (ref 30.00–100.00)

## 2023-10-06 ENCOUNTER — Ambulatory Visit (INDEPENDENT_AMBULATORY_CARE_PROVIDER_SITE_OTHER): Payer: No Typology Code available for payment source | Admitting: Family Medicine

## 2023-10-06 ENCOUNTER — Encounter: Payer: Self-pay | Admitting: Family Medicine

## 2023-10-06 VITALS — BP 126/74 | HR 78 | Temp 97.8°F | Ht 61.5 in | Wt 235.0 lb

## 2023-10-06 DIAGNOSIS — D6859 Other primary thrombophilia: Secondary | ICD-10-CM

## 2023-10-06 DIAGNOSIS — K52832 Lymphocytic colitis: Secondary | ICD-10-CM

## 2023-10-06 DIAGNOSIS — Z79899 Other long term (current) drug therapy: Secondary | ICD-10-CM

## 2023-10-06 DIAGNOSIS — K219 Gastro-esophageal reflux disease without esophagitis: Secondary | ICD-10-CM

## 2023-10-06 DIAGNOSIS — Z23 Encounter for immunization: Secondary | ICD-10-CM

## 2023-10-06 DIAGNOSIS — F418 Other specified anxiety disorders: Secondary | ICD-10-CM

## 2023-10-06 DIAGNOSIS — D75839 Thrombocytosis, unspecified: Secondary | ICD-10-CM

## 2023-10-06 DIAGNOSIS — Z131 Encounter for screening for diabetes mellitus: Secondary | ICD-10-CM

## 2023-10-06 DIAGNOSIS — I10 Essential (primary) hypertension: Secondary | ICD-10-CM | POA: Diagnosis not present

## 2023-10-06 DIAGNOSIS — Z8673 Personal history of transient ischemic attack (TIA), and cerebral infarction without residual deficits: Secondary | ICD-10-CM

## 2023-10-06 DIAGNOSIS — E559 Vitamin D deficiency, unspecified: Secondary | ICD-10-CM | POA: Insufficient documentation

## 2023-10-06 DIAGNOSIS — E538 Deficiency of other specified B group vitamins: Secondary | ICD-10-CM

## 2023-10-06 DIAGNOSIS — E782 Mixed hyperlipidemia: Secondary | ICD-10-CM | POA: Diagnosis not present

## 2023-10-06 DIAGNOSIS — Z0000 Encounter for general adult medical examination without abnormal findings: Secondary | ICD-10-CM | POA: Diagnosis not present

## 2023-10-06 DIAGNOSIS — D509 Iron deficiency anemia, unspecified: Secondary | ICD-10-CM

## 2023-10-06 DIAGNOSIS — K51919 Ulcerative colitis, unspecified with unspecified complications: Secondary | ICD-10-CM

## 2023-10-06 MED ORDER — BUPROPION HCL ER (XL) 150 MG PO TB24: 150.0000 mg | ORAL_TABLET | Freq: Every day | ORAL | 3 refills | Status: DC

## 2023-10-06 NOTE — Assessment & Plan Note (Signed)
 Lab Results  Component Value Date   HGBA1C 6.3 09/29/2023   HGBA1C 6.3 (H) 01/12/2021   HGBA1C 6.2 (H) 05/24/2019   disc imp of low glycemic diet and wt loss to prevent DM2  This is stable  Starting weight watchers which should help

## 2023-10-06 NOTE — Patient Instructions (Addendum)
 If you are interested in the shingles vaccine series (Shingrix), call your insurance or pharmacy to check on coverage and location it must be given.  If affordable - you can schedule it here or at your pharmacy depending on coverage   Get to your gyn for mammogram and exam    Think about exercise  Add some strength training to your routine, this is important for bone and brain health and can reduce your risk of falls and help your body use insulin properly and regulate weight  Light weights, exercise bands , and internet videos are a good way to start  Yoga (chair or regular), machines , floor exercises or a gym with machines are also good options    For bone and overall health  Try to get 1200-1500 mg of calcium  per day with at least 4000 iu of vitamin D  - for bone health  If the calcium  constipates you just take the vitamin D    Start wellbutrin  xl 150 mg daily in am  Follow up in 1-2 months   Start back on the iron of your choice  We will discuss further next time   Continue the weight watchers

## 2023-10-06 NOTE — Assessment & Plan Note (Signed)
 Reviewed health habits including diet and exercise and skin cancer prevention Reviewed appropriate screening tests for age  Also reviewed health mt list, fam hx and immunization status , as well as social and family history   See HPI Labs reviewed and ordered Health Maintenance  Topic Date Due   Hepatitis C Screening  Never done   Mammogram  06/19/2020   Pap with HPV screening  06/19/2022   COVID-19 Vaccine (4 - 2024-25 season) 06/15/2024*   Zoster (Shingles) Vaccine (1 of 2) 01/02/2025*   Colon Cancer Screening  02/18/2026   DTaP/Tdap/Td vaccine (4 - Td or Tdap) 10/05/2033   Pneumococcal Vaccination  Completed   Flu Shot  Completed   HIV Screening  Completed   HPV Vaccine  Aged Out  *Topic was postponed. The date shown is not the original due date.   Prevnar 20 vaccine and Td today Plans to check on coverage of shingrix  Will return to gyn for exam/pap and mammogram and have result sent  Sees GI for colitis/is utd colonoscopy  Discussed fall prevention, supplements and exercise for bone density  PHQ 4- wellbutrin  added today with follow up

## 2023-10-06 NOTE — Assessment & Plan Note (Signed)
 bp in fair control at this time without medication  BP Readings from Last 1 Encounters:  10/06/23 126/74   No changes needed Most recent labs reviewed  Disc lifstyle change with low sodium diet and exercise

## 2023-10-06 NOTE — Assessment & Plan Note (Signed)
 Discussed how this problem influences overall health and the risks it imposes  Reviewed plan for weight loss with lower calorie diet (via better food choices (lower glycemic and portion control) along with exercise building up to or more than 30 minutes 5 days per week including some aerobic activity and strength training    Starting weight watchers and doing well so far

## 2023-10-06 NOTE — Assessment & Plan Note (Signed)
 Plt 420 Suspect due to iron def   Continue to follow   Follow up in 1-2 mo

## 2023-10-06 NOTE — Assessment & Plan Note (Signed)
 Continues xarelto  which she tolerates well 20 mg daily  No new stroke or thrombosis symptoms

## 2023-10-06 NOTE — Assessment & Plan Note (Signed)
 Continues protonix  40 mg daily  Encouraged to avoid triggers  Working on weight loss   Watching vitamin levels Will need to monitor bone density

## 2023-10-06 NOTE — Assessment & Plan Note (Signed)
 Continues GI follow up  Some anemia  Diarrhea is chronic

## 2023-10-06 NOTE — Assessment & Plan Note (Signed)
 Disc goals for lipids and reasons to control them Rev last labs with pt Rev low sat fat diet in detail LDL Of 69  Goal of under 70 Continues atorvastatin  80 mg daily

## 2023-10-06 NOTE — Assessment & Plan Note (Signed)
 Continues GI follow up Some anemia

## 2023-10-06 NOTE — Assessment & Plan Note (Signed)
 No new symptoms  Controlled HTN and lipids

## 2023-10-06 NOTE — Progress Notes (Signed)
 Subjective:    Patient ID: Carol Johnson, female    DOB: 01-04-1966, 58 y.o.   MRN: [id number]  HPI  Here for health maintenance exam and to review chronic medical problems  Worsened anemia  Mood-wants to go back on wellbutrin    Wt Readings from Last 3 Encounters:  10/06/23 235 lb (106.6 kg)  05/31/23 228 lb 6 oz (103.6 kg)  05/16/23 248 lb 4 oz (112.6 kg)   43.68 kg/m  Has lost 10 lb with weight watchers since [place]   Vitals:   10/06/23 1014  BP: 126/74  Pulse: 78  Temp: 97.8 F (36.6 C)  SpO2: 97%    Immunization History  Administered Date(s) Administered   Influenza Whole 07/21/2004   Influenza,inj,Quad PF,6+ Mos 06/26/2013, 09/02/2014, 05/25/2019, 07/07/2020   Influenza-Unspecified 06/20/2017, 06/20/2018, 06/13/2022, 06/14/2023   PFIZER(Purple Top)SARS-COV-2 Vaccination 11/10/2019, 12/04/2019, 06/13/2020   PNEUMOCOCCAL CONJUGATE-20 10/06/2023   Pneumococcal Polysaccharide-23 10/20/2004, 07/04/2017   Td 06/21/2003, 10/06/2023   Tdap 06/26/2013    Health Maintenance Due  Topic Date Due   Hepatitis C Screening  Never done   MAMMOGRAM  06/19/2020   Cervical Cancer Screening (HPV/Pap Cotest)  06/19/2022   Pna vaccine -interested in   Flu - had shot at work in oct   Td - today   Shingrix -interested for later    Mammogram 05/2019  Self breast examno lumps   Gyn health pap 05/2019 normal with neg HPV    Colon cancer screening - had a colonoscopy 01/2021 with 5 y recall  Has history of lymphocytic colitis /UC Using entocort 9 mg daily -is holding right now  (tapered off of)  Align probiotic -helped a little  Under GI care  Still having some trouble with it  Has a diet to follow    Bone health  Falls-none Fractures-none  Supplements - mvi for adults   Last vitamin D  Lab Results  Component Value Date   VD25OH 18.90 (L) 09/29/2023    Exercise  None  Would like to do more  Counts her steps     Mood    10/06/2023   10:54 AM 05/31/2023    12:48 PM 01/25/2023    2:09 PM 09/24/2022   12:37 PM 07/30/2019    5:40 PM  Depression screen PHQ 2/9  Decreased Interest 0 0 0 0 0  Down, Depressed, Hopeless 0 0 0 1 0  PHQ - 2 Score 0 0 0 1 0  Altered sleeping 1 0 2 1   Tired, decreased energy 2 2 2 1    Change in appetite 1 2 2 1    Feeling bad or failure about yourself  0 0 0 1   Trouble concentrating 0 1 0 0   Moving slowly or fidgety/restless 0 0 0 0   Suicidal thoughts 0 0 0 0   PHQ-9 Score 4 5 6 5    Difficult doing work/chores Not difficult at all Not difficult at all Not difficult at all        05/31/2023   12:49 PM  GAD 7 : Generalized Anxiety Score  Nervous, Anxious, on Edge 0  Control/stop worrying 0  Worry too much - different things 0  Trouble relaxing 0  Restless 0  Easily annoyed or irritable 0  Afraid - awful might happen 0  Total GAD 7 Score 0  Anxiety Difficulty Not difficult at all      Celexa  20 mg daily  Trazodone  50-100 mg for sleep prn  Wants to  add back wellbutrin  -felt better on it   HTN in setting of CAD and cerebral artery aneurysm and past CVA bp is stable today  No cp or palpitations or headaches or edema  No medications currently  Watched by cardiology BP Readings from Last 3 Encounters:  10/06/23 126/74  05/31/23 128/78  05/16/23 134/84    Lab Results  Component Value Date   NA 139 09/29/2023   NA 139 09/29/2023   K 4.2 09/29/2023   K 4.2 09/29/2023   CO2 27 09/29/2023   CO2 27 09/29/2023   GLUCOSE 98 09/29/2023   GLUCOSE 98 09/29/2023   BUN 8 09/29/2023   BUN 8 09/29/2023   CREATININE 0.73 09/29/2023   CREATININE 0.73 09/29/2023   CALCIUM  9.7 09/29/2023   CALCIUM  9.7 09/29/2023   GFR 90.30 09/29/2023   GFR 90.30 09/29/2023   EGFR 102 06/13/2023   GFRNONAA >60 10/28/2021   Has mild OSA No cpap- was planning to try oral device   Hyperlipidemia Lab Results  Component Value Date   CHOL 133 09/29/2023   CHOL 146 09/24/2022   CHOL 132 10/17/2020   Lab Results   Component Value Date   HDL 36.80 (L) 09/29/2023   HDL 38.20 (L) 09/24/2022   HDL 36.30 (L) 10/17/2020   Lab Results  Component Value Date   LDLCALC 69 09/29/2023   LDLCALC 86 09/24/2022   LDLCALC 69 10/17/2020   Lab Results  Component Value Date   TRIG 139.0 09/29/2023   TRIG 109.0 09/24/2022   TRIG 136.0 10/17/2020   Lab Results  Component Value Date   CHOLHDL 4 09/29/2023   CHOLHDL 4 09/24/2022   CHOLHDL 4 10/17/2020   Lab Results  Component Value Date   LDLDIRECT 193.0 08/04/2017   LDLDIRECT 176.0 07/04/2017   LDLDIRECT 181.5 06/26/2013   On atorvastatin  80 mg daily    DM screening  Lab Results  Component Value Date   HGBA1C 6.3 09/29/2023   HGBA1C 6.3 (H) 01/12/2021   HGBA1C 6.2 (H) 05/24/2019   Takes xarelto  for hypercoag state with past cva and DVT   GERD Protonix  40 mg daily   Lab Results  Component Value Date   VITAMINB12 213 09/29/2023   Past iron def Lab Results  Component Value Date   WBC 7.6 09/29/2023   HGB 10.0 (L) 09/29/2023   HCT 32.3 (L) 09/29/2023   MCV 68.6 Repeated and verified X2. (L) 09/29/2023   PLT 420.0 (H) 09/29/2023   Lab Results  Component Value Date   IRON 16 (L) 09/29/2023   FERRITIN 2.3 (L) 09/29/2023   Her hemorrhoids bleed all the time Sees GI  Colonoscopy utd      Patient Active Problem List   Diagnosis Date Noted   Vitamin D  deficiency 10/06/2023   Diabetes mellitus screening 09/20/2023   Current use of proton pump inhibitor 05/31/2023   Mild intermittent asthma with exacerbation 01/25/2023   Allergy to drug 11/02/2021   Chronic neck pain 01/12/2021   Hearing loss 10/29/2020   Hypercoagulable state (HCC) 12/18/2019   Hair loss 12/03/2019   Aneurysm of anterior cerebral artery 12/03/2019   Muscle weakness (generalized) 06/11/2019   Hemiplegia and hemiparesis following cerebral infarction affecting left non-dominant side (HCC) 06/11/2019   Major depressive disorder    Hypokalemia    Ulcerative  colitis with complication (HCC)    H/O: CVA (cerebrovascular accident) 05/23/2019   Thrombocytosis 05/23/2019   Coronary artery disease involving native coronary artery of native heart without angina  pectoris 03/21/2018   Coronary artery calcification 02/10/2018   Obesity (BMI 30-39.9) 02/10/2018   Former smoker 01/15/2018   Carpal tunnel syndrome 12/21/2017   Stress incontinence 12/21/2017   Paresthesia of both feet 07/04/2017   Always thirsty 07/04/2017   Lymphocytic colitis 01/16/2015   Anemia, iron deficiency 01/16/2015   Insomnia 11/27/2014   History of DVT (deep vein thrombosis) 09/17/2014   OSA (obstructive sleep apnea) 09/02/2014   Gastritis, chronic 06/06/2014   Internal hemorrhoids with other complication 04/19/2014   Routine general medical examination at a health care facility 10/14/2011   Thyroid  nodule 05/16/2011   Rectal bleeding 11/24/2010   Hemorrhoids, internal 11/24/2010   Irritable bowel syndrome (IBS) 11/24/2010   Hyperlipidemia 07/18/2009   Essential hypertension 07/18/2009   Anxiety state 01/31/2008   Depression with anxiety 01/31/2008   GASTRIC ULCER, HX OF 01/31/2008   FIBROIDS, UTERUS 11/13/2007   Allergic rhinitis 11/13/2007   Asthma 11/13/2007   GERD 11/13/2007   IRRITABLE BOWEL SYNDROME 11/13/2007   Internal hemorrhoids 11/12/2003   Past Medical History:  Diagnosis Date   Abdominal pain, unspecified site    Allergic rhinitis    Anxiety    Asthma    Depression    DVT (deep venous thrombosis) (HCC) 2016   DVT of deep femoral vein, left (HCC)    Fibroid uterus    GERD (gastroesophageal reflux disease)    Hypercholesterolemia    Hypertension    Internal hemorrhoid    Irritable bowel syndrome    Lymphocytic colitis 01/02/2015   Pneumonia 1998   Stroke (HCC) 2020   left sided weekness   Ulcerative colitis (HCC)    URI (upper respiratory infection)    Uterine fibroid    Vitamin B12 deficiency    Past Surgical History:  Procedure  Laterality Date   CESAREAN SECTION  08/30/2002   CHOLECYSTECTOMY     laparoscopic gallstones   ESOPHAGOGASTRODUODENOSCOPY (EGD) WITH PROPOFOL  N/A 05/03/2014   Procedure: ESOPHAGOGASTRODUODENOSCOPY (EGD) WITH PROPOFOL ;  Surgeon: Lamar JONETTA Aho, MD;  Location: [facility];  Service: Endoscopy;  Laterality: N/A;   UTERINE FIBROID SURGERY     Social History   Tobacco Use   Smoking status: Former    Current packs/day: 0.00    Average packs/day: 0.1 packs/day for 10.0 years (1.0 ttl pk-yrs)    Types: Cigarettes    Start date: 12/08/2007    Quit date: 12/07/2017    Years since quitting: 5.8   Smokeless tobacco: Never  Vaping Use   Vaping status: Never Used  Substance Use Topics   Alcohol use: Yes    Alcohol/week: 0.0 standard drinks of alcohol    Comment: rarely   Drug use: No   Family History  Problem Relation Age of Onset   Hyperlipidemia Mother    Mitral valve prolapse Mother    Heart disease Mother        arrhythmia   Pulmonary fibrosis Father    Colon cancer Paternal Aunt    Heart disease Paternal Aunt    Heart disease Paternal Uncle    Heart failure Maternal Grandmother    Uterine cancer Paternal Grandmother        with mets to the colon    Kidney disease Cousin    Esophageal cancer Neg Hx    Stomach cancer Neg Hx    Rectal cancer Neg Hx    Allergies  Allergen Reactions   Dalbavancin Rash    Lips, skin tingling rash   Zoloft [Sertraline Hcl] Diarrhea  Current Outpatient Medications on File Prior to Visit  Medication Sig Dispense Refill   acetaminophen  (TYLENOL ) 325 MG tablet Take 2 tablets (650 mg total) by mouth every 4 (four) hours as needed for mild pain (or temp > 37.5 C (99.5 F)).     albuterol  (PROVENTIL ) (2.5 MG/3ML) 0.083% nebulizer solution Take 3 mLs (2.5 mg total) by nebulization every 6 (six) hours as needed for wheezing or shortness of breath. 150 mL 1   albuterol  (VENTOLIN  HFA) 108 (90 Base) MCG/ACT inhaler INHALE 2 PUFFS INTO THE LUNGS EVERY 4  (FOUR) HOURS AS NEEDED FOR WHEEZING, COUGH, OR SHORTNESS OF BREATH 6.7 each 3   atorvastatin  (LIPITOR ) 80 MG tablet TAKE 1 TABLET BY MOUTH DAILY AT 6 PM. 90 tablet 0   citalopram  (CELEXA ) 20 MG tablet TAKE 1 TABLET BY MOUTH EVERYDAY AT BEDTIME 90 tablet 3   COLLAGEN PO Take by mouth.     Elderberry 500 MG CAPS Take by mouth.     fexofenadine  (ALLEGRA ) 180 MG tablet Take 180 mg by mouth daily as needed for allergies.     fluticasone  (FLONASE ) 50 MCG/ACT nasal spray Place 2 sprays into both nostrils daily. 16 g 6   loperamide  (IMODIUM ) 2 MG capsule Take 1 capsule (2 mg total) by mouth as needed for diarrhea or loose stools. (Patient taking differently: Take 2 mg by mouth at bedtime.) 30 capsule 0   Magnesium  Chloride-Calcium  (SLOW MAGNESIUM /CALCIUM ) 64-106 MG TBEC Take 2 tablets by mouth daily. 14 tablet 0   Multiple Vitamin (MULTI VITAMIN DAILY PO) Take by mouth.     NON FORMULARY [employer]     ondansetron  (ZOFRAN ) 4 MG tablet Take 1 tablet (4 mg total) by mouth every 8 (eight) hours as needed for nausea or vomiting. 30 tablet 0   pantoprazole  (PROTONIX ) 40 MG tablet TAKE 1 TABLET BY MOUTH EVERY DAY 90 tablet 0   rivaroxaban  (XARELTO ) 20 MG TABS tablet TAKE 1 TABLET BY MOUTH DAILY WITH SUPPER 90 tablet 3   traZODone  (DESYREL ) 50 MG tablet TAKE 1 TO 2 TABLETS BY MOUTH AT BEDTIME AS NEEDED FOR SLEEP 180 tablet 1   budesonide  (ENTOCORT EC ) 3 MG 24 hr capsule Take 3 capsules (9 mg total) by mouth daily. (Patient not taking: Reported on 10/06/2023) 90 capsule 0   No current facility-administered medications on file prior to visit.    Review of Systems  Constitutional:  Positive for fatigue. Negative for activity change, appetite change, fever and unexpected weight change.  HENT:  Negative for congestion, ear pain, rhinorrhea, sinus pressure and sore throat.   Eyes:  Negative for pain, redness and visual disturbance.  Respiratory:  Negative for cough, shortness of breath and wheezing.    Cardiovascular:  Negative for chest pain and palpitations.  Gastrointestinal:  Negative for abdominal pain, blood in stool, constipation and diarrhea.       Hemorrhoidal bleeding  Colitis-intermittent diarrhea   Endocrine: Negative for polydipsia and polyuria.  Genitourinary:  Negative for dysuria, frequency and urgency.  Musculoskeletal:  Negative for arthralgias, back pain and myalgias.  Skin:  Negative for pallor and rash.  Allergic/Immunologic: Negative for environmental allergies.  Neurological:  Negative for dizziness, syncope and headaches.  Hematological:  Negative for adenopathy. Does not bruise/bleed easily.  Psychiatric/Behavioral:  Positive for dysphoric mood. Negative for decreased concentration. The patient is not nervous/anxious.        Objective:   Physical Exam Constitutional:      General: She is not in acute distress.  Appearance: Normal appearance. She is well-developed. She is obese. She is not ill-appearing or diaphoretic.  HENT:     Head: Normocephalic and atraumatic.     Right Ear: Tympanic membrane, ear canal and external ear normal.     Left Ear: Tympanic membrane, ear canal and external ear normal.     Nose: Nose normal. No congestion.     Mouth/Throat:     Mouth: Mucous membranes are moist.     Pharynx: Oropharynx is clear. No posterior oropharyngeal erythema.  Eyes:     General: No scleral icterus.    Extraocular Movements: Extraocular movements intact.     Conjunctiva/sclera: Conjunctivae normal.     Pupils: Pupils are equal, round, and reactive to light.  Neck:     Thyroid : No thyromegaly.     Vascular: No carotid bruit or JVD.  Cardiovascular:     Rate and Rhythm: Normal rate and regular rhythm.     Pulses: Normal pulses.     Heart sounds: Normal heart sounds.     No gallop.  Pulmonary:     Effort: Pulmonary effort is normal. No respiratory distress.     Breath sounds: Normal breath sounds. No wheezing.     Comments: Good air  exch Chest:     Chest wall: No tenderness.  Abdominal:     General: Bowel sounds are normal. There is no distension or abdominal bruit.     Palpations: Abdomen is soft. There is no mass.     Tenderness: There is no abdominal tenderness.     Hernia: No hernia is present.  Genitourinary:    Comments: Breast and pelvic exam are done by gyn provider   Musculoskeletal:        General: No tenderness. Normal range of motion.     Cervical back: Normal range of motion and neck supple. No rigidity. No muscular tenderness.     Right lower leg: No edema.     Left lower leg: No edema.     Comments: No kyphosis   Lymphadenopathy:     Cervical: No cervical adenopathy.  Skin:    General: Skin is warm and dry.     Coloration: Skin is not pale.     Findings: No erythema or rash.     Comments: Solar lentigines diffusely   Neurological:     Mental Status: She is alert. Mental status is at baseline.     Cranial Nerves: No cranial nerve deficit.     Motor: No abnormal muscle tone.     Coordination: Coordination normal.     Gait: Gait normal.     Deep Tendon Reflexes: Reflexes are normal and symmetric. Reflexes normal.  Psychiatric:        Mood and Affect: Mood normal.        Cognition and Memory: Cognition and memory normal.     Comments: Candidly discusses symptoms and stressors             Assessment & Plan:   Problem List Items Addressed This Visit       Cardiovascular and Mediastinum   Essential hypertension   bp in fair control at this time without medication  BP Readings from Last 1 Encounters:  10/06/23 126/74   No changes needed Most recent labs reviewed  Disc lifstyle change with low sodium diet and exercise           Digestive   Ulcerative colitis with complication (HCC)   Continues GI follow up Some anemia  Lymphocytic colitis   Continues GI follow up  Some anemia  Diarrhea is chronic       GERD   Continues protonix  40 mg daily  Encouraged to  avoid triggers  Working on weight loss   Watching vitamin levels Will need to monitor bone density         Hematopoietic and Hemostatic   Thrombocytosis   Plt 420 Suspect due to iron def   Continue to follow   Follow up in 1-2 mo         Other   Vitamin D  deficiency   Last vitamin D  Lab Results  Component Value Date   VD25OH 18.90 (L) 09/29/2023   Encouraged 4000 international units D3 daily for bone and overall health  With calcium  if tolerated       Routine general medical examination at a health care facility - Primary   Reviewed health habits including diet and exercise and skin cancer prevention Reviewed appropriate screening tests for age  Also reviewed health mt list, fam hx and immunization status , as well as social and family history   See HPI Labs reviewed and ordered Health Maintenance  Topic Date Due   Hepatitis C Screening  Never done   Mammogram  06/19/2020   Pap with HPV screening  06/19/2022   COVID-19 Vaccine (4 - 2024-25 season) 06/15/2024*   Zoster (Shingles) Vaccine (1 of 2) 01/02/2025*   Colon Cancer Screening  02/18/2026   DTaP/Tdap/Td vaccine (4 - Td or Tdap) 10/05/2033   Pneumococcal Vaccination  Completed   Flu Shot  Completed   HIV Screening  Completed   HPV Vaccine  Aged Out  *Topic was postponed. The date shown is not the original due date.   Prevnar 20 vaccine and Td today Plans to check on coverage of shingrix  Will return to gyn for exam/pap and mammogram and have result sent  Sees GI for colitis/is utd colonoscopy  Discussed fall prevention, supplements and exercise for bone density  PHQ 4- wellbutrin  added today with follow up       Obesity (BMI 30-39.9)   Discussed how this problem influences overall health and the risks it imposes  Reviewed plan for weight loss with lower calorie diet (via better food choices (lower glycemic and portion control) along with exercise building up to or more than 30 minutes 5 days per week  including some aerobic activity and strength training    Starting weight watchers and doing well so far       Hyperlipidemia   Disc goals for lipids and reasons to control them Rev last labs with pt Rev low sat fat diet in detail LDL Of 69  Goal of under 70 Continues atorvastatin  80 mg daily       Hypercoagulable state (HCC)   Continues xarelto  which she tolerates well 20 mg daily  No new stroke or thrombosis symptoms       H/O: CVA (cerebrovascular accident)   No new symptoms  Controlled HTN and lipids       Diabetes mellitus screening   Lab Results  Component Value Date   HGBA1C 6.3 09/29/2023   HGBA1C 6.3 (H) 01/12/2021   HGBA1C 6.2 (H) 05/24/2019   disc imp of low glycemic diet and wt loss to prevent DM2  This is stable  Starting weight watchers which should help      Depression with anxiety   HQ 4 GAD7 0  Pt takes celexa  20 mg daily  Trazodone   prn sleep  Wants to start back on wellbutrin  for low mood Sent wellbutrin  xl 150 mg daily to pharm  Instructed to take in am Discussed expectations of this  medication including time to effectiveness and mechanism of action, also poss of side effects (early and late)- including mental fuzziness, weight or appetite change, nausea and poss of worse dep or anxiety (even suicidal thoughts)  Pt voiced understanding and will stop med and update if this occurs   Consider counseling Follow up 1-2 months       Relevant Medications   buPROPion  (WELLBUTRIN  XL) 150 MG 24 hr tablet   Current use of proton pump inhibitor   Last vitamin D  Lab Results  Component Value Date   VD25OH 18.90 (L) 09/29/2023   Encouraged to start suppl this  Lab Results  Component Value Date   VITAMINB12 213 09/29/2023   Has mvi with B12 -this is stable      Anemia, iron deficiency   Lab Results  Component Value Date   WBC 7.6 09/29/2023   HGB 10.0 (L) 09/29/2023   HCT 32.3 (L) 09/29/2023   MCV 68.6 Repeated and verified X2. (L)  09/29/2023   PLT 420.0 (H) 09/29/2023   Lab Results  Component Value Date   IRON 16 (L) 09/29/2023   FERRITIN 2.3 (L) 09/29/2023    Acute on chronic in setting of bleeding hemorrhoids and colitis under care of GI  Pt will go back on oral iron  If not well tolerated would consider heme ref to consider iron infusion  Does have fatigue  Will re check and discuss further at 1-2 mo f/u      Other Visit Diagnoses       Need for Td vaccine       Relevant Orders   Td : Tetanus/diphtheria >7yo Preservative  free (Completed)     Need for pneumococcal 20-valent conjugate vaccination       Relevant Orders   Pneumococcal conjugate vaccine 20-valent (Prevnar 20) (Completed)

## 2023-10-06 NOTE — Assessment & Plan Note (Signed)
 Last vitamin D  Lab Results  Component Value Date   VD25OH 18.90 (L) 09/29/2023   Encouraged to start suppl this  Lab Results  Component Value Date   VITAMINB12 213 09/29/2023   Has mvi with B12 -this is stable

## 2023-10-06 NOTE — Assessment & Plan Note (Signed)
 Last vitamin D  Lab Results  Component Value Date   VD25OH 18.90 (L) 09/29/2023   Encouraged 4000 international units D3 daily for bone and overall health  With calcium  if tolerated

## 2023-10-06 NOTE — Assessment & Plan Note (Signed)
 Lab Results  Component Value Date   WBC 7.6 09/29/2023   HGB 10.0 (L) 09/29/2023   HCT 32.3 (L) 09/29/2023   MCV 68.6 Repeated and verified X2. (L) 09/29/2023   PLT 420.0 (H) 09/29/2023   Lab Results  Component Value Date   IRON 16 (L) 09/29/2023   FERRITIN 2.3 (L) 09/29/2023    Acute on chronic in setting of bleeding hemorrhoids and colitis under care of GI  Pt will go back on oral iron  If not well tolerated would consider heme ref to consider iron infusion  Does have fatigue  Will re check and discuss further at 1-2 mo f/u

## 2023-10-06 NOTE — Assessment & Plan Note (Signed)
 HQ 4 GAD7 0  Pt takes celexa  20 mg daily  Trazodone  prn sleep  Wants to start back on wellbutrin  for low mood Sent wellbutrin  xl 150 mg daily to pharm  Instructed to take in am Discussed expectations of this  medication including time to effectiveness and mechanism of action, also poss of side effects (early and late)- including mental fuzziness, weight or appetite change, nausea and poss of worse dep or anxiety (even suicidal thoughts)  Pt voiced understanding and will stop med and update if this occurs   Consider counseling Follow up 1-2 months

## 2023-11-16 ENCOUNTER — Encounter: Payer: Self-pay | Admitting: Gastroenterology

## 2023-11-16 ENCOUNTER — Encounter: Payer: Self-pay | Admitting: Family Medicine

## 2023-11-17 MED ORDER — FUSION PLUS PO CAPS: 1.0000 | ORAL_CAPSULE | ORAL | 3 refills | Status: AC

## 2023-12-05 ENCOUNTER — Ambulatory Visit: Payer: No Typology Code available for payment source | Admitting: Family Medicine

## 2023-12-12 ENCOUNTER — Other Ambulatory Visit: Payer: Self-pay | Admitting: Family Medicine

## 2023-12-12 NOTE — Telephone Encounter (Signed)
 CPE was on 10/06/23 Last filled on 12/15/22 #90 tabs/ 3 refills

## 2023-12-15 ENCOUNTER — Other Ambulatory Visit: Payer: Self-pay | Admitting: Family Medicine

## 2024-02-20 ENCOUNTER — Telehealth: Payer: Self-pay | Admitting: Family Medicine

## 2024-02-20 MED ORDER — HYDROXYZINE PAMOATE 25 MG PO CAPS: 25.0000 mg | ORAL_CAPSULE | Freq: Three times a day (TID) | ORAL | 0 refills | Status: AC | PRN

## 2024-02-20 NOTE — Telephone Encounter (Signed)
 I sent it  It will sedate so be aware/do not drive with it Hope it helps

## 2024-02-20 NOTE — Telephone Encounter (Signed)
 Pt notified of Dr. Royden Purl comments

## 2024-02-20 NOTE — Telephone Encounter (Signed)
 Copied from CRM [id number]. Topic: General - Other >> Feb 20, 2024  8:49 AM Wess RAMAN wrote: Reason for CRM: Patient has questions about flying. She would like to speak with Dr. Randeen or the nurse. She flies out on 02/24/24  Callback #: [phone]

## 2024-02-20 NOTE — Addendum Note (Signed)
 Addended by: RANDEEN HARDY A on: 02/20/2024 04:41 PM   Modules accepted: Orders

## 2024-02-20 NOTE — Telephone Encounter (Signed)
 With her current medicines I would not advise ambien - would go with hydroxyzine  How long is her flight ?

## 2024-02-20 NOTE — Telephone Encounter (Signed)
 Flight is 1 hr 41 min, From here to  [place], [place]  Pt is okay with Hydroxyzine if that's what PCP is okay sending in

## 2024-02-20 NOTE — Telephone Encounter (Signed)
 Pt wanted to ask PCP if it was okay to fly, pt hasn't flown since her stroke and aneurism so she wanted to check with PCP to make sure it's safe.  Pt also asked if PCP could prescribe maybe 2 pills of Ambien . Pt said PCP has given her that in the past and it helps her flying anxiety. Pt said she does have someone to drive her to the airport and from the airport and also her flight is during the day so she wouldn't be taking it close to her Trazodone  pill  [facility] [address]

## 2024-03-14 ENCOUNTER — Other Ambulatory Visit: Payer: Self-pay | Admitting: Family Medicine

## 2024-03-20 ENCOUNTER — Ambulatory Visit: Payer: Self-pay | Admitting: *Deleted

## 2024-03-20 NOTE — Telephone Encounter (Signed)
 FYI Only or Action Required?: FYI only for provider.  Patient was last seen in primary care on 10/06/2023 by Randeen Laine LABOR, MD.  Called Nurse Triage reporting Sinusitis (Ear pain).  Symptoms began several days ago.  Interventions attempted: Nothing.  Symptoms are: gradually worsening.  Triage Disposition: See Physician Within 24 Hours  Patient/caregiver understands and will follow disposition?: yes   Reason for Disposition  Earache  Answer Assessment - Initial Assessment Questions 1. LOCATION: Where does it hurt?      R ear pain- congestion, popping, sinus tight 2. ONSET: When did the sinus pain start?  (e.g., hours, days)      4 days ago 3. SEVERITY: How bad is the pain?   (Scale 0-10; or none, mild, moderate or severe)     7/10 4. RECURRENT SYMPTOM: Have you ever had sinus problems before? If Yes, ask: When was the last time? and What happened that time?      Yes- patient states she is leaving on vacation on Thursday 5. NASAL CONGESTION: Is the nose blocked? If Yes, ask: Can you open it or must you breathe through your mouth?     Yes- patient is breathing through her mouth 6. NASAL DISCHARGE: Do you have discharge from your nose? If so ask, What color?     Not a lot- no color 7. FEVER: Do you have a fever? If Yes, ask: What is it, how was it measured, and when did it start?      no 8. OTHER SYMPTOMS: Do you have any other symptoms? (e.g., sore throat, cough, earache, difficulty breathing)     Sore throat, cough, headache, ear pain  Protocols used: Sinus Pain or Congestion-A-AH    Copied from CRM #[id number]. Topic: Clinical - Red Word Triage >> Mar 20, 2024  3:59 PM Franky GRADE wrote: Red Word that prompted transfer to Nurse Triage: Patient is calling due to a possible sinus infection that started 4 days ago, symptoms have worsen and she is unable to hear out of her right ear with severe pain.

## 2024-03-20 NOTE — Telephone Encounter (Signed)
 Aware, will watch for correspondence Thanks for seeing her

## 2024-03-20 NOTE — Telephone Encounter (Signed)
 Appt scheduled with Dr. Joelle Musca tomorrow. Will route to him and PCP as a FYI

## 2024-03-21 ENCOUNTER — Encounter: Payer: Self-pay | Admitting: Internal Medicine

## 2024-03-21 ENCOUNTER — Other Ambulatory Visit: Payer: Self-pay | Admitting: Family Medicine

## 2024-03-21 ENCOUNTER — Ambulatory Visit: Admitting: Internal Medicine

## 2024-03-21 VITALS — BP 118/76 | HR 72 | Temp 98.1°F | Ht 61.5 in | Wt 240.0 lb

## 2024-03-21 DIAGNOSIS — J0100 Acute maxillary sinusitis, unspecified: Secondary | ICD-10-CM | POA: Insufficient documentation

## 2024-03-21 MED ORDER — AMOXICILLIN-POT CLAVULANATE 875-125 MG PO TABS
1.0000 | ORAL_TABLET | Freq: Two times a day (BID) | ORAL | 1 refills | Status: AC
Start: 2024-03-21 — End: ?

## 2024-03-21 NOTE — Progress Notes (Signed)
 Subjective:    Patient ID: Carol Johnson, female    DOB: 01/04/1966, 58 y.o.   MRN: [id number]  HPI Here due to respiratory symptoms  Started 5-6 days ago Sinus pain and stuffed right ear Now the left is also clogged and trouble hearing Bad pain in right ear Maxillary sinus pain Fever 2 nights ago Some chills/sweats with this Some cough---does have post nasal drip Slight sore throat this morning No SOB  Taking mucinex  sinus--no clear help  Current Outpatient Medications on File Prior to Visit  Medication Sig Dispense Refill   acetaminophen  (TYLENOL ) 325 MG tablet Take 2 tablets (650 mg total) by mouth every 4 (four) hours as needed for mild pain (or temp > 37.5 C (99.5 F)).     albuterol  (PROVENTIL ) (2.5 MG/3ML) 0.083% nebulizer solution Take 3 mLs (2.5 mg total) by nebulization every 6 (six) hours as needed for wheezing or shortness of breath. 150 mL 1   albuterol  (VENTOLIN  HFA) 108 (90 Base) MCG/ACT inhaler INHALE 2 PUFFS INTO THE LUNGS EVERY 4 (FOUR) HOURS AS NEEDED FOR WHEEZING, COUGH, OR SHORTNESS OF BREATH 6.7 each 3   atorvastatin  (LIPITOR ) 80 MG tablet TAKE 1 TABLET BY MOUTH DAILY AT 6 PM. 90 tablet 1   buPROPion  (WELLBUTRIN  XL) 150 MG 24 hr tablet Take 1 tablet (150 mg total) by mouth daily. 90 tablet 3   citalopram  (CELEXA ) 20 MG tablet TAKE 1 TABLET BY MOUTH EVERYDAY AT BEDTIME 90 tablet 3   COLLAGEN PO Take by mouth.     Elderberry 500 MG CAPS Take by mouth.     fexofenadine  (ALLEGRA ) 180 MG tablet Take 180 mg by mouth daily as needed for allergies.     fluticasone  (FLONASE ) 50 MCG/ACT nasal spray Place 2 sprays into both nostrils daily. 16 g 6   hydrOXYzine  (VISTARIL ) 25 MG capsule Take 1 capsule (25 mg total) by mouth every 8 (eight) hours as needed for anxiety (air plane flight). Caution of sedation 10 capsule 0   Iron-FA-B Cmp-C-Biot-Probiotic (FUSION PLUS) CAPS Take 1 capsule by mouth every other day. 30 capsule 3   loperamide  (IMODIUM ) 2 MG capsule Take 1 capsule  (2 mg total) by mouth as needed for diarrhea or loose stools. (Patient taking differently: Take 2 mg by mouth at bedtime.) 30 capsule 0   Magnesium  Chloride-Calcium  (SLOW MAGNESIUM /CALCIUM ) 64-106 MG TBEC Take 2 tablets by mouth daily. 14 tablet 0   Multiple Vitamin (MULTI VITAMIN DAILY PO) Take by mouth.     NON FORMULARY [employer] [facility]     pantoprazole  (PROTONIX ) 40 MG tablet TAKE 1 TABLET BY MOUTH EVERY DAY 90 tablet 1   rivaroxaban  (XARELTO ) 20 MG TABS tablet TAKE 1 TABLET BY MOUTH DAILY WITH SUPPER 90 tablet 3   traZODone  (DESYREL ) 50 MG tablet TAKE 1 TO 2 TABLETS BY MOUTH AT BEDTIME AS NEEDED FOR SLEEP 180 tablet 1   No current facility-administered medications on file prior to visit.    Allergies  Allergen Reactions   Dalbavancin Rash    Lips, skin tingling rash   Zoloft [Sertraline Hcl] Diarrhea    Past Medical History:  Diagnosis Date   Abdominal pain, unspecified site    Allergic rhinitis    Anxiety    Asthma    Depression    DVT (deep venous thrombosis) (HCC) 2016   DVT of deep femoral vein, left (HCC)    Fibroid uterus    GERD (gastroesophageal reflux disease)    Hypercholesterolemia    Hypertension  Internal hemorrhoid    Irritable bowel syndrome    Lymphocytic colitis 01/02/2015   Pneumonia 1998   Stroke [facility]) 2020   left sided weekness   Ulcerative colitis (HCC)    URI (upper respiratory infection)    Uterine fibroid    Vitamin B12 deficiency     Past Surgical History:  Procedure Laterality Date   CESAREAN SECTION  08/30/2002   CHOLECYSTECTOMY     laparoscopic gallstones   ESOPHAGOGASTRODUODENOSCOPY (EGD) WITH PROPOFOL  N/A 05/03/2014   Procedure: ESOPHAGOGASTRODUODENOSCOPY (EGD) WITH PROPOFOL ;  Surgeon: Lamar JONETTA Aho, MD;  Location: [facility] END[facility];  Service: Endoscopy;  Laterality: N/A;   UTERINE FIBROID SURGERY      Family History  Problem Relation Age of Onset   Hyperlipidemia Mother    Mitral valve prolapse Mother    Heart disease Mother         arrhythmia   Pulmonary fibrosis Father    Colon cancer Paternal Aunt    Heart disease Paternal Aunt    Heart disease Paternal Uncle    Heart failure Maternal Grandmother    Uterine cancer Paternal Grandmother        with mets to the colon    Kidney disease Cousin    Esophageal cancer Neg Hx    Stomach cancer Neg Hx    Rectal cancer Neg Hx     Social History   Socioeconomic History   Marital status: Legally Separated    Spouse name: Not on file   Number of children: 1   Years of education: two year college   Highest education level: Associate degree: academic program  Occupational History   Occupation: Admin. assistant  Tobacco Use   Smoking status: Former    Current packs/day: 0.00    Average packs/day: 0.1 packs/day for 10.0 years (1.0 ttl pk-yrs)    Types: Cigarettes    Start date: 12/08/2007    Quit date: 12/07/2017    Years since quitting: 6.2   Smokeless tobacco: Never  Vaping Use   Vaping status: Never Used  Substance and Sexual Activity   Alcohol use: Yes    Alcohol/week: 0.0 standard drinks of alcohol    Comment: rarely   Drug use: No   Sexual activity: Yes    Birth control/protection: Pill  Other Topics Concern   Not on file  Social History Narrative   Lives alone. Son is there part of the time.   Right-handed.   Caffeine  use: estimates seven cans of soda per day (12 ounces each).   Social Drivers of Corporate investment banker Strain: Not on file  Food Insecurity: Not on file  Transportation Needs: Not on file  Physical Activity: Not on file  Stress: Not on file  Social Connections: Not on file  Intimate Partner Violence: Not on file   Review of Systems No N/V Eating okay     Objective:   Physical Exam Constitutional:      Appearance: Normal appearance.  HENT:     Head:     Comments: Bilateral maxillary tenderness    Ears:     Comments: Cerumen deep in both canals--TMs not visible    Mouth/Throat:     Pharynx: No oropharyngeal  exudate or posterior oropharyngeal erythema.  Neck:     Comments: Mildly enlarged, slightly tender anterior cervical nodes Pulmonary:     Effort: Pulmonary effort is normal.     Breath sounds: Normal breath sounds. No wheezing or rales.  Musculoskeletal:  Cervical back: Neck supple.  Neurological:     Mental Status: She is alert.            Assessment & Plan:

## 2024-03-21 NOTE — Telephone Encounter (Signed)
 Will check at the OV today

## 2024-03-21 NOTE — Assessment & Plan Note (Signed)
 Has had recurrent issues Discussed restarting fluticasone  spray Augmentin  875 bid x 7 days (with refill)

## 2024-05-17 ENCOUNTER — Other Ambulatory Visit: Payer: Self-pay | Admitting: Family Medicine

## 2024-05-17 NOTE — Telephone Encounter (Signed)
 Last filled on 04/20/24 #180 tab/ 1 refill  Last OV was PCP was a CPE on 10/06/23

## 2024-09-12 ENCOUNTER — Other Ambulatory Visit: Payer: Self-pay | Admitting: Family Medicine

## 2024-09-12 NOTE — Telephone Encounter (Signed)
 Pt is due for CPE (labs prior) on or after 10/06/24, please schedule. Med refilled once

## 2024-09-22 ENCOUNTER — Other Ambulatory Visit: Payer: Self-pay | Admitting: Family Medicine

## 2024-09-24 NOTE — Telephone Encounter (Signed)
 Please schedule her annual  Thanks

## 2024-09-24 NOTE — Telephone Encounter (Signed)
 CPE was 10/06/23 however pt no-showed her f/u on mood appt on 12/05/23, no future appts., please advise

## 2024-09-26 NOTE — Telephone Encounter (Signed)
 lvm for pt to call office to schedule appt.
